# Patient Record
Sex: Male | Born: 1940
Health system: Southern US, Community
[De-identification: ages and names within clinical notes are randomized; demographics above are authoritative.]

## PROBLEM LIST (undated history)

## (undated) DIAGNOSIS — Z8719 Personal history of other diseases of the digestive system: Secondary | ICD-10-CM

## (undated) DIAGNOSIS — R51 Headache: Secondary | ICD-10-CM

## (undated) DIAGNOSIS — Z9889 Other specified postprocedural states: Secondary | ICD-10-CM

## (undated) DIAGNOSIS — R519 Headache, unspecified: Secondary | ICD-10-CM

## (undated) DIAGNOSIS — R112 Nausea with vomiting, unspecified: Secondary | ICD-10-CM

## (undated) DIAGNOSIS — K2270 Barrett's esophagus without dysplasia: Secondary | ICD-10-CM

## (undated) DIAGNOSIS — IMO0001 Reserved for inherently not codable concepts without codable children: Secondary | ICD-10-CM

## (undated) DIAGNOSIS — E782 Mixed hyperlipidemia: Secondary | ICD-10-CM

## (undated) DIAGNOSIS — D509 Iron deficiency anemia, unspecified: Secondary | ICD-10-CM

## (undated) DIAGNOSIS — I447 Left bundle-branch block, unspecified: Secondary | ICD-10-CM

## (undated) DIAGNOSIS — E041 Nontoxic single thyroid nodule: Secondary | ICD-10-CM

## (undated) DIAGNOSIS — R040 Epistaxis: Secondary | ICD-10-CM

## (undated) DIAGNOSIS — M549 Dorsalgia, unspecified: Secondary | ICD-10-CM

## (undated) DIAGNOSIS — E119 Type 2 diabetes mellitus without complications: Secondary | ICD-10-CM

## (undated) DIAGNOSIS — M25552 Pain in left hip: Secondary | ICD-10-CM

## (undated) DIAGNOSIS — C159 Malignant neoplasm of esophagus, unspecified: Secondary | ICD-10-CM

## (undated) DIAGNOSIS — H9190 Unspecified hearing loss, unspecified ear: Secondary | ICD-10-CM

## (undated) DIAGNOSIS — K5790 Diverticulosis of intestine, part unspecified, without perforation or abscess without bleeding: Secondary | ICD-10-CM

## (undated) DIAGNOSIS — D170 Benign lipomatous neoplasm of skin and subcutaneous tissue of head, face and neck: Secondary | ICD-10-CM

## (undated) DIAGNOSIS — M5135 Other intervertebral disc degeneration, thoracolumbar region: Secondary | ICD-10-CM

## (undated) DIAGNOSIS — G609 Hereditary and idiopathic neuropathy, unspecified: Secondary | ICD-10-CM

## (undated) DIAGNOSIS — K219 Gastro-esophageal reflux disease without esophagitis: Secondary | ICD-10-CM

## (undated) DIAGNOSIS — I700 Atherosclerosis of aorta: Secondary | ICD-10-CM

## (undated) HISTORY — PX: SHOULDER ARTHROSCOPY W/ SUPERIOR LABRAL ANTERIOR POSTERIOR LESION REPAIR: SHX2402

## (undated) HISTORY — PX: HERNIA REPAIR: SHX51

## (undated) HISTORY — DX: Left bundle-branch block, unspecified: I44.7

## (undated) HISTORY — PX: COLONOSCOPY: SHX174

## (undated) HISTORY — PX: PORTA CATH INSERTION: CATH118285

## (undated) NOTE — *Deleted (*Deleted)
Juan [name]   [phone] [phone]   Clinic Follow up Note   Patient Care Team: Nord, Rama Georgianne Fick), MD (Inactive) as PCP - General (Internal Medicine) Malachy Mood, MD as Consulting Physician (Hematology) Dorothy Puffer, MD as Consulting Physician (Radiation Oncology) Charna Elizabeth, MD as Consulting Physician (Gastroenterology)  Date of Service:  01/10/2020  CHIEF COMPLAINT: Follow up esophageal squamous cell carcinoma  SUMMARY OF ONCOLOGIC HISTORY: Oncology History Overview Note  Presented with dysphagia and odynophagia with 20-25 pounds of weight loss in about 3-4 months  Esophageal cancer (HCC)   Staging form: Esophagus - Adenocarcinoma, AJCC 7th Edition   - Clinical stage from 11/13/2015: Stage IIIB (T3, N2, M0, G2) - Signed by Malachy Mood, MD on 12/11/2015    Esophageal cancer (HCC)  11/13/2015 Procedure   UPPER ENDOSCOPY per Dr. Loreta Ave: Large fungating, friable bleeding mass in middle third of esophagus, 22cm from incisors and extended to 27cm. Non-obstructing.   11/13/2015 Pathology Results   Invasive squamous cell carcinoma; moderately differentiated   11/13/2015 Imaging   CT ABD/PELVIS: IMPRESSION:No evidence of metastatic disease in the abdomen or pelvis. 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 Aortoiliac atherosclerosis. Small bilateral inguinal hernias containing fat the   11/22/2015 Initial Diagnosis   Esophageal cancer (HCC)   12/03/2015 Imaging   PET scan showed a long segment of hypermetabolic thickening in the mid esophagus consistent with esophageal carcinoma. No clear evidence of node metastasis or distant metastasis.   12/10/2015 - 01/14/2016 Radiation Therapy   Neoadjuvant radiation to his esophageal cancer   12/10/2015 - 01/15/2016 Chemotherapy   Neoadjuvant weekly carboplatin AUC 2, and Taxol 45 mg/m, with concurrent radiation. He developed steroid-induced psychosis, and Taxol was changed to Abraxane to avoid  premedication with steroids.   02/24/2016 Imaging   CT CAP with contrast showed interval improvement mid esophageal mass, no evidence for metastatic adenopathy or distant metastasis.    03/10/2016 Procedure   Repeat EGD by Dr. Elnoria Howard showed wall thickening in the thoracic esophagus with the tumor was. The thickness decreased from 12.8 mm to 6-7 mm. Not able to differentiate between actual tumor versus fibrosis from radiation. Some peritumoral shoddy lymph nodes.   04/07/2016 Surgery   Patient followed up with Dr. Tyrone Sage, patient is not a great candidate for surgery, pt also does not want surgery, mutually agreed to not pursue esophagectomy.    06/01/2016 Imaging   CT C/A/P IMPRESSION: Mild mid esophageal wall thickening without residual mass on CT. Radiation changes in the paramediastinal lung bilaterally. No evidence of recurrent or metastatic disease.   08/18/2016 -  Hospital Admission   Patient presented to the ER with SOB and complaints of chest pain   11/07/2016 Imaging   Nm Pet Image Restag IMPRESSION: Negative PET-CT. No findings for recurrent esophageal cancer or metastatic disease.   01/07/2017 Procedure   EUS by Dr. Elnoria Howard 01/07/17 IMPRESSION - Normal esophagus. - Normal stomach. - Normal examined duodenum. - Wall thickening was seen in the upper third of the esophagus and in the middle third of the esophagus. - One benign lymph node was visualized in the middle paraesophageal mediastinum (level 59M). - No specimens collected.   05/10/2017 Imaging   CT CAP W Contrast 05/10/17 IMPRESSION: 1. No new or progressive findings to suggest recurrent or metastatic disease in the chest or abdomen on today's study. 2.  Aortic Atherosclerois (ICD10-170.0)   01/14/2018 Imaging   01/14/2018 CT Neck IMPRESSION: 1. No evidence of cervical lymphadenopathy. 2. 5 cm  right neck lipoma, mildly enlarged from 2010.   01/14/2018 Imaging   01/14/2018 CT CAP IMPRESSION: 1. Interval  development of several tiny bilateral pulmonary nodules. While indeterminate and potentially related to infectious/inflammatory etiology, close attention recommended as early metastatic disease not excluded. 2. Otherwise stable exam. 3.  Emphysema. (ICD10-J43.9) 4.  Aortic Atherosclerois (ICD10-170.0)   01/25/2019 Imaging   CT AP W contrast  IMPRESSION: No evidence for residual or recurrent tumor within the abdomen or pelvis.     05/07/2019 Procedure   Upper Endoscopy by Dr Elnoria Howard  IMPRESSION - Benign-appearing esophageal stenosis. Dilated. - Normal stomach. - Normal examined duodenum. - No specimens collected.      CURRENT THERAPY:  Observation  INTERVAL HISTORY: *** Juan [name] is here for a follow up of esophageal cancer. He was last seen by me 6 months ago. He presents to the clinic alone.    REVIEW OF SYSTEMS:  *** Constitutional: Denies fevers, chills or abnormal weight loss Eyes: Denies blurriness of vision Ears, nose, mouth, throat, and face: Denies mucositis or sore throat Respiratory: Denies cough, dyspnea or wheezes Cardiovascular: Denies palpitation, chest discomfort or lower extremity swelling Gastrointestinal:  Denies nausea, heartburn or change in bowel habits Skin: Denies abnormal skin rashes Lymphatics: Denies new lymphadenopathy or easy bruising Neurological:Denies numbness, tingling or new weaknesses Behavioral/Psych: Mood is stable, no new changes  All other systems were reviewed with the patient and are negative.  MEDICAL HISTORY:  Past Medical History:  Diagnosis Date  . Aortic atherosclerosis (HCC)   . Back pain   . Barrett esophagus   . Bleeding nose   . DDD (degenerative disc disease), thoracolumbar    Moderate to severe  . Diabetes mellitus without complication (HCC)   . Diverticulosis   . Esophageal cancer (HCC) dx'd 2017  . GERD (gastroesophageal reflux disease)   . Headache   . Hearing loss    Right ear  . History of umbilical  hernia repair   . Idiopathic peripheral neuropathy   . Iron deficiency anemia   . LBBB (left bundle branch block) 02/11/2016  . Left hip pain    Severe degenerative changes  . Lipoma of neck    Right  . Mixed hyperlipidemia   . PONV (postoperative nausea and vomiting)   . Reflux   . Right thyroid nodule     SURGICAL HISTORY: Past Surgical History:  Procedure Laterality Date  . BALLOON DILATION N/A 05/06/2018   Procedure: BALLOON DILATION;  Surgeon: Jeani Hawking, MD;  Location: [facility];  Service: Endoscopy;  Laterality: N/A;  . COLONOSCOPY    . ESOPHAGOGASTRODUODENOSCOPY (EGD) WITH PROPOFOL N/A 05/06/2018   Procedure: ESOPHAGOGASTRODUODENOSCOPY (EGD) WITH PROPOFOL;  Surgeon: Jeani Hawking, MD;  Location: [facility];  Service: Endoscopy;  Laterality: N/A;  . EUS N/A 12/05/2015   Procedure: UPPER ENDOSCOPIC ULTRASOUND (EUS) LINEAR;  Surgeon: Jeani Hawking, MD;  Location: [facility];  Service: Endoscopy;  Laterality: N/A;  . EUS N/A 03/10/2016   Procedure: UPPER ENDOSCOPIC ULTRASOUND (EUS) LINEAR;  Surgeon: Jeani Hawking, MD;  Location: [facility];  Service: Endoscopy;  Laterality: N/A;  . EUS N/A 01/07/2017   Procedure: UPPER ENDOSCOPIC ULTRASOUND (EUS) LINEAR;  Surgeon: Jeani Hawking, MD;  Location: [facility];  Service: Endoscopy;  Laterality: N/A;  . HERNIA REPAIR    . IR GENERIC HISTORICAL  12/24/2015   IR FLUORO GUIDE PORT INSERTION RIGHT 12/24/2015 Oley Balm, MD [facility]  . IR GENERIC HISTORICAL  12/24/2015   IR US GUIDE VASC ACCESS RIGHT 12/24/2015  Oley Balm, MD [facility]  . IR REMOVAL TUN ACCESS W/ PORT W/O FL MOD SED  02/07/2018  . PORTA CATH INSERTION    . SHOULDER ARTHROSCOPY W/ SUPERIOR LABRAL ANTERIOR POSTERIOR LESION REPAIR     times 2  . TOTAL HIP ARTHROPLASTY Left 07/22/2017   Procedure: LEFT TOTAL HIP ARTHROPLASTY ANTERIOR APPROACH;  Surgeon: Samson Frederic, MD;  Location: [facility];  Service: Orthopedics;  Laterality: Left;  . UPPER GI ENDOSCOPY   01/07/2017    I have reviewed the social history and family history with the patient and they are unchanged from previous note.  ALLERGIES:  has No Known Allergies.  MEDICATIONS:  Current Outpatient Medications  Medication Sig Dispense Refill  . albuterol (VENTOLIN HFA) 108 (90 Base) MCG/ACT inhaler Inhale 1-2 puffs into the lungs every 6 (six) hours as needed for wheezing or shortness of breath.     Marland Kitchen amitriptyline (ELAVIL) 10 MG tablet Take 10 mg by mouth at bedtime.    Marland Kitchen aspirin EC 81 MG tablet Take 81 mg by mouth daily.    . B Complex Vitamins (B COMPLEX 1 PO) Take 1 tablet by mouth daily.    . benzonatate (TESSALON) 100 MG capsule Take 1 capsule (100 mg total) by mouth 3 (three) times daily as needed for cough. 30 capsule 0  . celecoxib (CELEBREX) 200 MG capsule Take 200 mg by mouth daily.    . cetirizine (ZYRTEC ALLERGY) 10 MG tablet Take 1 tablet (10 mg total) by mouth daily. 30 tablet 0  . fluticasone (FLONASE) 50 MCG/ACT nasal spray Place 1 spray into both nostrils daily. 16 g 0  . Fluticasone-Salmeterol (ADVAIR DISKUS) 250-50 MCG/DOSE AEPB Inhale 1 puff into the lungs in the morning and at bedtime. 60 each 0  . gabapentin (NEURONTIN) 300 MG capsule Take 300 mg by mouth 2 (two) times daily.    Marland Kitchen glimepiride (AMARYL) 1 MG tablet Take 1 mg by mouth daily after breakfast.     . LIPITOR 20 MG tablet Take 20 mg by mouth every evening.    . Multiple Vitamin (MULTIVITAMIN WITH MINERALS) TABS tablet Take 1 tablet by mouth daily.    . pantoprazole (PROTONIX) 40 MG tablet Take 40 mg by mouth daily.     No current facility-administered medications for this visit.    PHYSICAL EXAMINATION: ECOG PERFORMANCE STATUS: {CHL ONC ECOG PS:[id number]}  There were no vitals filed for this visit. There were no vitals filed for this visit. *** GENERAL:alert, no distress and comfortable SKIN: skin color, texture, turgor are normal, no rashes or significant lesions EYES: normal, Conjunctiva are  pink and non-injected, sclera clear {OROPHARYNX:no exudate, no erythema and lips, buccal mucosa, and tongue normal}  NECK: supple, thyroid normal size, non-tender, without nodularity LYMPH:  no palpable lymphadenopathy in the cervical, axillary {or inguinal} LUNGS: clear to auscultation and percussion with normal breathing effort HEART: regular rate & rhythm and no murmurs and no lower extremity edema ABDOMEN:abdomen soft, non-tender and normal bowel sounds Musculoskeletal:no cyanosis of digits and no clubbing  NEURO: alert & oriented x 3 with fluent speech, no focal motor/sensory deficits  LABORATORY DATA:  I have reviewed the data as listed CBC Latest Ref Rng & Units 01/09/2020 07/22/2019 07/15/2019  WBC 4.0 - 10.5 K/uL 4.2 4.9 3.5(L)  Hemoglobin 13.0 - 17.0 g/dL 11.4(L) 12.7(L) 12.5(L)  Hematocrit 39 - 52 % 37.4(L) 41.3 41.9  Platelets 150 - 400 K/uL 218 278 284     CMP Latest Ref Rng & Units  01/09/2020 07/22/2019 07/15/2019  Glucose 70 - 99 mg/dL 161(W) 960(A) 540(J)  BUN 8 - 23 mg/dL 7(L) 10 6(L)  Creatinine 0.61 - 1.24 mg/dL 8.11 9.14 7.82  Sodium 135 - 145 mmol/L 142 139 138  Potassium 3.5 - 5.1 mmol/L 4.0 4.5 4.1  Chloride 98 - 111 mmol/L 107 105 104  CO2 22 - 32 mmol/L 30 25 25   Calcium 8.9 - 10.3 mg/dL 9.5(A) 9.3 9.0  Total Protein 6.5 - 8.1 g/dL 6.8 - 6.6  Total Bilirubin 0.3 - 1.2 mg/dL 0.5 - 1.2  Alkaline Phos 38 - 126 U/L 69 - 64  AST 15 - 41 U/L 23 - 55(H)  ALT 0 - 44 U/L 16 - 23      RADIOGRAPHIC STUDIES: I have personally reviewed the radiological images as listed and agreed with the findings in the report. No results found.   ASSESSMENT & PLAN:  Juan [name] is a 53 y.o. male with    1. 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 -He was treated with concurrent chemoRTwith weekly carboplatin and Taxol.Taxol was changed to  Abraxane due to steroids induced psychosis -Patient and Dr. Tyrone Sage has mutually agreed not to pursue esophagectomy due to his advanced age and general health condition -We previously discussed that his esophageal cancer is unlikely cured by chemoradiation alone without surgery. I reviewed his repeated EUS after his chemotherapy and irradiation, which showed good response to chemoradiation, but possible residual tumor. -Following surveillance scans have been NED. -He is clinically doing well. Labs reviewed, CBC and CMP WNL except MCV 75.7. Physical exam unremarkable except palpable thyroid nodule. There is no clinical concern for recurrence.  -He is still swallowing adequately with no diet restrictions. No new pain or concerns.  -He is over 3.5 years since his diagnosis. His risk of recurrence is very small. Continue with 5 year surveillance plan. Last surveillance CT scan in 6 months -F/u in 6 months.    2. History of steroids induced psychosis -Resolved now. We'll avoid steroids in the future.  3. Leukopenia and anemia  -Secondary to chemotherapy radiation -We'll continue monitoring, no need for blood transfusion  4. Chronic back pain, left hip painand leg pain -I previously encouraged the patient to follow with PCP and orthopedic surgeon  -He underwent left total hip arthroplasty on 07/22/17. Pain much improved. His overall MSK pain is manageable.  -Managed with Gabapentin and oxycodone. I do not refill his narcotics.   5. H/o COVID-19  -Tested positive in7/2020. Has recovered well. No symptoms remaining.   6. Right neck mass  -He has large right upper neck mass 3.5x4cm on exam today (07/13/19), likely a large thyroid nodule or lipoma.  -Per pt he has had this for 3-4 years. He notes his PCP palpated this before.  -This is likely benign. I recommend neck US to further evaluate for him. He is agreeable.    PLAN:  -neck US next week  -F/u in 6 months with lab and CT CAP  a few days before.    No problem-specific Assessment & Plan notes found for this encounter.   No orders of the defined types were placed in this encounter.  All questions were answered. The patient knows to call the clinic with any problems, questions or concerns. No barriers to learning was detected. The total time spent in the appointment was {CHL ONC TIME VISIT -  [id number]}.     Delphina Cahill 01/10/2020   Rogelia Rohrer, am acting as scribe for Malachy Mood, MD.   {Add scribe attestation statement}

---

## 2006-02-21 ENCOUNTER — Emergency Department (HOSPITAL_COMMUNITY): Admission: EM | Admit: 2006-02-21 | Discharge: 2006-02-22 | Payer: Self-pay | Admitting: Emergency Medicine

## 2007-04-15 ENCOUNTER — Ambulatory Visit (HOSPITAL_COMMUNITY): Admission: RE | Admit: 2007-04-15 | Discharge: 2007-04-15 | Payer: Self-pay | Admitting: *Deleted

## 2007-04-15 ENCOUNTER — Encounter (INDEPENDENT_AMBULATORY_CARE_PROVIDER_SITE_OTHER): Payer: Self-pay | Admitting: *Deleted

## 2007-06-10 ENCOUNTER — Encounter: Admission: RE | Admit: 2007-06-10 | Discharge: 2007-06-10 | Payer: Self-pay | Admitting: Internal Medicine

## 2008-05-30 ENCOUNTER — Encounter (INDEPENDENT_AMBULATORY_CARE_PROVIDER_SITE_OTHER): Payer: Self-pay | Admitting: *Deleted

## 2008-05-30 ENCOUNTER — Ambulatory Visit (HOSPITAL_COMMUNITY): Admission: RE | Admit: 2008-05-30 | Discharge: 2008-05-30 | Payer: Self-pay | Admitting: *Deleted

## 2008-06-08 ENCOUNTER — Emergency Department (HOSPITAL_COMMUNITY): Admission: EM | Admit: 2008-06-08 | Discharge: 2008-06-08 | Payer: Self-pay | Admitting: Emergency Medicine

## 2008-08-22 ENCOUNTER — Encounter: Admission: RE | Admit: 2008-08-22 | Discharge: 2008-08-22 | Payer: Self-pay | Admitting: Internal Medicine

## 2008-10-15 ENCOUNTER — Emergency Department (HOSPITAL_COMMUNITY): Admission: EM | Admit: 2008-10-15 | Discharge: 2008-10-15 | Payer: Self-pay | Admitting: Emergency Medicine

## 2010-04-12 ENCOUNTER — Encounter
Admission: RE | Admit: 2010-04-12 | Discharge: 2010-04-12 | Payer: Self-pay | Source: Home / Self Care | Attending: Internal Medicine | Admitting: Internal Medicine

## 2010-05-15 ENCOUNTER — Encounter (HOSPITAL_BASED_OUTPATIENT_CLINIC_OR_DEPARTMENT_OTHER)
Admission: RE | Admit: 2010-05-15 | Discharge: 2010-05-15 | Disposition: A | Discharge status: Home / Self Care | Payer: Medicare HMO | Source: Ambulatory Visit | Attending: Orthopedic Surgery | Admitting: Orthopedic Surgery

## 2010-05-15 DIAGNOSIS — Z01810 Encounter for preprocedural cardiovascular examination: Secondary | ICD-10-CM | POA: Insufficient documentation

## 2010-05-20 ENCOUNTER — Ambulatory Visit (HOSPITAL_BASED_OUTPATIENT_CLINIC_OR_DEPARTMENT_OTHER)
Admission: RE | Admit: 2010-05-20 | Discharge: 2010-05-21 | Disposition: A | Discharge status: Home / Self Care | Payer: Medicare HMO | Source: Ambulatory Visit | Attending: Orthopedic Surgery | Admitting: Orthopedic Surgery

## 2010-05-20 DIAGNOSIS — Z5333 Arthroscopic surgical procedure converted to open procedure: Secondary | ICD-10-CM | POA: Insufficient documentation

## 2010-05-20 DIAGNOSIS — M67919 Unspecified disorder of synovium and tendon, unspecified shoulder: Secondary | ICD-10-CM | POA: Insufficient documentation

## 2010-05-20 DIAGNOSIS — Z01810 Encounter for preprocedural cardiovascular examination: Secondary | ICD-10-CM | POA: Insufficient documentation

## 2010-05-20 DIAGNOSIS — M719 Bursopathy, unspecified: Secondary | ICD-10-CM | POA: Insufficient documentation

## 2010-05-20 DIAGNOSIS — M7500 Adhesive capsulitis of unspecified shoulder: Secondary | ICD-10-CM | POA: Insufficient documentation

## 2010-05-20 LAB — POCT HEMOGLOBIN-HEMACUE: Hemoglobin: 14.6 g/dL (ref 13.0–17.0)

## 2010-05-29 NOTE — Op Note (Signed)
Juan [name], Juan [name]                  ACCOUNT NO.:  [id number]  MEDICAL RECORD NO.:  [id number]           PATIENT TYPE:  LOCATION:                                 FACILITY:  PHYSICIAN:  Juan Fitch. Day Greb, M.D.      DATE OF BIRTH:  DATE OF PROCEDURE:  05/20/2010 DATE OF DISCHARGE:                              OPERATIVE REPORT   PREOPERATIVE DIAGNOSES:  MRI documented massive rotator cuff tear, right shoulder with acromioclavicular arthropathy and clinical examination suggesting adhesive capsulitis.  POSTOPERATIVE DIAGNOSES:  Adhesive capsulitis, partial-thickness biceps tear, retracted rotator cuff tear involving the entire supraspinatus, infraspinatus, and teres minor posteriorly.  OPERATION: 1. Examination of right shoulder under anesthesia confirming mild     adhesive capsulitis contracture. 2. Diagnostic arthroscopy, right glenohumeral joint followed by     arthroscopic debridement of labral tear, biceps 30% tear, extensive anterior and superior synovitis and capsulectomy to relieve     adhesive capsulitis. 3. Arthroscopic subacromial decompression with bursectomy,     coracoacromial ligament partial release and acromioplasty. 4. Arthroscopic distal clavicle resection. 5. Open reconstruction of three tendons, rotator cuff retracted tear     with margin convergence, and replacement of supraspinatus,     infraspinatus, and teres minor to an anatomic decorticated foot     print.  OPERATING SURGEON:  Juan Fitch. Daja Shuping, MD  ASSISTANT:  Marveen Reeks. Dasnoit, PA-C.  ANESTHESIA:  General by endotracheal technique supplemented by right interscalene block.  SUPERVISING ANESTHESIOLOGIST:  Quita Skye. Krista Blue, MD  INDICATIONS:  Juan [name] is a 70 year old gentleman referred through the courtesy of Dr. Alben Deeds, rheumatologist for evaluation of a painful and weak right shoulder.  Juan [name] had a previous right wrist rotator cuff reconstruction in [place] performed in 1999.  He  presented with pain, weakness, loss of abduction, external rotation of the right shoulder.  Dr. Dierdre Forth very appropriately tried a subacromial injection without relief.  Subsequently, the shoulder was imaged and found to have a significant rotator cuff tear confirmed by an MRI.  Juan [name] is referred for an upper extremity orthopedic consult on April 30, 2010.  At that time, we noted impairment of the right shoulder motion, AC arthropathy, and a very significant retracted rotator cuff tear.  We advised Juan [name] that we proceed with a staged evaluation and management of his shoulder anticipating arthroscopic management of as much of the pathologies possible followed by open reconstruction of his very significant retracted rotator cuff tear.  Questions were invited and answered preoperatively in detail.  His past medical records were reviewed by myself and Dr. Krista Blue, the attending anesthesiologist including records from Dr. Shawnee Knapp office in [facility].  Dr. Krista Blue recommended proceeding with general anesthesia by endotracheal technique supplemented by a right brachial plexus block.  The block was placed in holding area without complication.  Questions were invited and answered in detail.  PROCEDURE:  Juan [name] was brought to room 2 of the [facility] and placed in supine position on the operating table.  Following the induction of general endotracheal anesthesia under Dr. Robina Ade direct supervision, he was  carefully positioned in a beach- chair position with aid of a torso and head holder designed from arthroscopy.  Vancomycin 1 g was administered as an IV prophylactic antibiotic followed by placement of passive compression devices for deep vein thrombosis prevention prior to the induction of anesthesia.  In the beach-chair position, we carefully examined the right shoulder. There is combined elevation of 160, external rotation 80,  internal rotation of 50.  There was moderate adhesive capsulitis noted.  Juan [name]' right shoulder was then prepped with DuraPrep and draped with impervious arthroscopy drapes.  Procedure commenced with placement of the arthroscope through a standard posterior viewing portal with blunt technique.  Diagnostic arthroscopy confirmed a massive retracted rotator cuff tear with necrotic margins. The biceps tendon had a 30% inferior posterior degenerative tear 1 cm medial to the bicipital groove.  The labrum was degenerative and shaggy hanging within the joint.  Juan [name] had 4+ adhesive capsulitis tissues that were addressed by placing a suction shaver through an anterior portal and debrided under direct vision.  The labrum was debrided to a stable margin followed by capsulectomy to facilitate reconstruction of the rotator cuff and release of the adhesive capsulitis contracture.  After completion of the intra-articular debridement.  Photographic documentation of the tear was accomplished followed by removal of the arthroscopic equipment.  The scope was placed in subacromial space followed by extensive bursectomy.  The acromion was leveled to a type 1 morphology.  The coracoacromial ligament relaxed with cutting cautery.  The distal centimeter clavicle was removed arthroscopically.  After hemostasis was achieved, the scope was removed and an anterior middle third deltoid muscle splitting incision was fashioned.  The cuff required an anterior interval slide for mobilization of the supraspinatus.  The infraspinatus and teres minor were identified posteriorly, grasped with a Kingfisher followed by placement of traction sutures of fiber tape.  With great effort, the supraspinatus, infraspinatus, teres minor were dissected with tenotomy scissors relieving bursal adhesions, ultimately mobilized an anatomic footprint.  A complex margin convergence was accomplished with a center swivel lock  with fiber tape at the articular margin, supplemented by the anchor suture of #2 FiberWire.  The tendon was lateralized to an anatomic footprint followed by use of a pair of fiber tapes one advancing the infraspinatus, one advancing the supraspinatus, and anatomic footprint.  Finishing sutures of #2 FiberWire were used in the swivel locks to obtain a smooth repair.  An anatomic footprint was achieved.  The limiting factor and healing of this repair will be the poor quality of the rotator cuff tendons.  We will move very slowly in the sense that Juan [name] will likely require three or more months for revascularizations of the tendon to the decorticated greater tuberosity.  We did perform a greater tuberosity plasty lowering the profile of the tuberosity with a 4 mm due to reactive osteophyte that had formed over the years.  There are no apparent complications.  Total blood loss was less than 50 mL.  The wounds were then irrigated thoroughly followed by repair of the deltoid split with an interrupted suture of 0 Vicryl, repair of the skin with subcutaneous 2-0 Vicryl, and repair the skin itself with intradermal 3-0 Prolene and Steri-Strips.  The portals repaired with intradermal 3-0 Prolene.  There were no apparent complications.  For aftercare, Juan [name] was placed in a sling, awakened from general anesthesia, and transferred to the recovery room with stable signs.  He will be discharged to the care of his family  in 24 hours.  Overnight, we anticipate p.o. and IV Dilaudid for analgesia and IV Ancef 1 g q.8 h. x3 doses.     Juan [name], M.D.     RVS/MEDQ  D:  05/20/2010  T:  05/21/2010  Job:  [id number]  cc:   Donnetta Hail, MD Thurmon Fair, MD  Electronically Signed by Josephine Igo M.D. on 05/29/2010 10:19:28 AM

## 2010-08-12 NOTE — Op Note (Signed)
[name], Juan                  ACCOUNT NO.:  [id number]   MEDICAL RECORD NO.:  [id number]          PATIENT TYPE:  AMB   LOCATION:  ENDO                         FACILITY:  [facility]   PHYSICIAN:  Georgiana Spinner, M.D.    DATE OF BIRTH:  1940/07/02   DATE OF PROCEDURE:  04/15/2007  DATE OF DISCHARGE:                               OPERATIVE REPORT   PROCEDURE:  Upper endoscopy.   INDICATIONS:  Gastroesophageal reflux disease.  .   ANESTHESIA:  Fentanyl 50 mcg, Versed 4 mg.   DESCRIPTION OF PROCEDURE:  With the patient mildly sedated in the left  lateral decubitus position, the Pentax videoscopic endoscope was  inserted in the mouth and passed under direct vision through the  esophagus, which appeared normal, until I reached distal esophagus and  there was an area of Barrett's esophagus, photographed and biopsied.  We  entered Into the stomach.  The  fundus, body, antrum, duodenal bulb, and  second portion of the duodenum were visualized and appeared normal.  From this point, the endoscope was slowly withdrawn taking  circumferential views of the duodenal mucosa until the endoscope had  been  pulled back into the stomach, placed in retroflexion and viewed  the stomach from below.  The endoscope was then straightened, withdrawn,  taking circumferential views of the remaining gastric and esophageal  mucosa.  The patient's vital signs and pulse oximetry remained stable.  The patient tolerated the procedure well without apparent complication.   FINDINGS:  Barrett's esophagus.  Await biopsy report.  The patient will  call me for results and follow-up with me as needed as an outpatient.  Proceed to colonoscopy.           ______________________________  Georgiana Spinner, M.D.     GMO/MEDQ  D:  04/15/2007  T:  04/15/2007  Job:  [id number]

## 2010-08-12 NOTE — Op Note (Signed)
NAMERAJINDER, MESICK                  ACCOUNT NO.:  [id number]   MEDICAL RECORD NO.:  [id number]          PATIENT TYPE:  AMB   LOCATION:  ENDO                         FACILITY:  [facility]   PHYSICIAN:  Georgiana Spinner, M.D.    DATE OF BIRTH:  June 08, 1940   DATE OF PROCEDURE:  DATE OF DISCHARGE:                               OPERATIVE REPORT   PROCEDURE:  Upper endoscopy with biopsy.   INDICATIONS:  Gastroesophageal reflux disease with Barrett's esophagus.   ANESTHESIA:  Fentanyl 50 mcg, Versed 4 mg.   DESCRIPTION OF PROCEDURE:  With the patient mildly sedated in the left  lateral decubitus position, the Pentax videoscopic endoscope was  inserted in the mouth and passed under direct vision through the  esophagus which appeared normal until we reached the distal esophagus  and there were changes of Barrett's, photographed and biopsied.  We  entered into the stomach.  The fundus, body, antrum, duodenal bulb and  second portion of duodenum were visualized.  From this point, the  endoscope was slowly withdrawn taking circumferential views of duodenal  mucosa until the endoscope had been pulled back in the stomach and  placed in retroflexion to view the stomach from below.  The endoscope  was straightened and withdrawn taking circumferential views of remaining  gastric and esophageal mucosa.  The patient's vital signs and pulse  oximeter remained stable.  The patient tolerated the procedure well  without apparent complication.   FINDINGS:  Changes of Barrett's esophagus, biopsied.  Await biopsy  report.  The patient will call me for results and follow-up with me as  an outpatient.           ______________________________  Georgiana Spinner, M.D.     GMO/MEDQ  D:  05/30/2008  T:  05/30/2008  Job:  [id number]

## 2010-08-12 NOTE — Op Note (Signed)
[name], Juan                  ACCOUNT NO.:  [id number]   MEDICAL RECORD NO.:  [id number]          PATIENT TYPE:  AMB   LOCATION:  ENDO                         FACILITY:  [facility]   PHYSICIAN:  Georgiana Spinner, M.D.    DATE OF BIRTH:  06-05-1940   DATE OF PROCEDURE:  04/15/2007  DATE OF DISCHARGE:                               OPERATIVE REPORT   PROCEDURE:  Colonoscopy.   INDICATIONS:  Colon polyps.   ANESTHESIA:  Fentanyl 25 mcg, Versed 1 mg.   DESCRIPTION OF PROCEDURE:  With the patient mildly sedated and in the  left lateral decubitus position, rectal examination was performed which  was unremarkable to my exam.  Subsequently the Pentax videoscopic  colonoscope was inserted in the rectum and passed under direct vision.  With  pressure applied and the patient rolled to his back, we were able  to reach the cecum as identified by ileocecal valve and base of the  cecum, both of which were photographed.  Of note, the prep was  suboptimal in that mostly colon was covered with a gritty, brownish,  semi-liquid material that coated the lining of the colon throughout  except for the rectosigmoid area.  It did wash and was suctioned  somewhat.  The exam was somewhat limited by the prep and a small lesion  certainly could be missed.  From this point,  the colonoscope was slowly  withdrawn, taking circumferential views of the colonic mucosa, stopping  only in the rectum which appeared normal on direct and showed  hemorrhoids on retroflexed view.  The endoscope was straightened and  withdrawn.  The patient's vital signs and pulse oximetry remained  stable.  The patient tolerated the procedure well without apparent  complications.   FINDINGS:  Internal hemorrhoids otherwise an unremarkable examination.   PLAN:  Consider repeat examination in two to five years because of prep.           ______________________________  Georgiana Spinner, M.D.     GMO/MEDQ  D:  04/15/2007  T:  04/15/2007   Job:  [id number]

## 2010-11-25 ENCOUNTER — Emergency Department (HOSPITAL_COMMUNITY): Payer: Medicare HMO

## 2010-11-25 ENCOUNTER — Emergency Department (HOSPITAL_COMMUNITY): Payer: No Typology Code available for payment source

## 2010-11-25 ENCOUNTER — Emergency Department (HOSPITAL_COMMUNITY)
Admission: EM | Admit: 2010-11-25 | Discharge: 2010-11-25 | Disposition: A | Discharge status: Home / Self Care | Payer: No Typology Code available for payment source | Attending: Emergency Medicine | Admitting: Emergency Medicine

## 2010-11-25 ENCOUNTER — Emergency Department (HOSPITAL_COMMUNITY)
Admission: EM | Admit: 2010-11-25 | Discharge: 2010-11-26 | Disposition: A | Discharge status: Home / Self Care | Payer: Medicare HMO | Attending: Emergency Medicine | Admitting: Emergency Medicine

## 2010-11-25 ENCOUNTER — Inpatient Hospital Stay (INDEPENDENT_AMBULATORY_CARE_PROVIDER_SITE_OTHER)
Admission: RE | Admit: 2010-11-25 | Discharge: 2010-11-25 | Disposition: A | Discharge status: Home / Self Care | Payer: Managed Care, Other (non HMO) | Source: Ambulatory Visit | Attending: Family Medicine | Admitting: Family Medicine

## 2010-11-25 DIAGNOSIS — S61209A Unspecified open wound of unspecified finger without damage to nail, initial encounter: Secondary | ICD-10-CM | POA: Insufficient documentation

## 2010-11-25 DIAGNOSIS — R319 Hematuria, unspecified: Secondary | ICD-10-CM | POA: Insufficient documentation

## 2010-11-25 DIAGNOSIS — M129 Arthropathy, unspecified: Secondary | ICD-10-CM | POA: Insufficient documentation

## 2010-11-25 DIAGNOSIS — Z7982 Long term (current) use of aspirin: Secondary | ICD-10-CM | POA: Insufficient documentation

## 2010-11-25 DIAGNOSIS — K219 Gastro-esophageal reflux disease without esophagitis: Secondary | ICD-10-CM | POA: Insufficient documentation

## 2010-11-25 DIAGNOSIS — IMO0002 Reserved for concepts with insufficient information to code with codable children: Secondary | ICD-10-CM | POA: Insufficient documentation

## 2010-11-25 DIAGNOSIS — Y92410 Unspecified street and highway as the place of occurrence of the external cause: Secondary | ICD-10-CM | POA: Insufficient documentation

## 2010-11-25 DIAGNOSIS — R0789 Other chest pain: Secondary | ICD-10-CM | POA: Insufficient documentation

## 2010-11-25 DIAGNOSIS — M79609 Pain in unspecified limb: Secondary | ICD-10-CM | POA: Insufficient documentation

## 2010-11-25 DIAGNOSIS — R071 Chest pain on breathing: Secondary | ICD-10-CM

## 2010-11-25 DIAGNOSIS — R1011 Right upper quadrant pain: Secondary | ICD-10-CM | POA: Insufficient documentation

## 2010-11-25 DIAGNOSIS — R079 Chest pain, unspecified: Secondary | ICD-10-CM | POA: Insufficient documentation

## 2010-11-25 LAB — BASIC METABOLIC PANEL
BUN: 10 mg/dL (ref 6–23)
CO2: 28 mEq/L (ref 19–32)
Calcium: 8.9 mg/dL (ref 8.4–10.5)
Chloride: 103 mEq/L (ref 96–112)
Creatinine, Ser: 0.93 mg/dL (ref 0.50–1.35)
GFR calc Af Amer: >60 mL/min (ref >60)
GFR calc non Af Amer: >60 mL/min (ref >60)
Glucose, Bld: 95 mg/dL (ref 70–99)
Potassium: 3.9 mEq/L (ref 3.5–5.1)
Sodium: 139 mEq/L (ref 135–145)

## 2010-11-25 LAB — POCT URINALYSIS DIP (DEVICE)
Bilirubin Urine: NEGATIVE
Glucose, UA: NEGATIVE mg/dL
Ketones, ur: NEGATIVE mg/dL
Leukocytes, UA: NEGATIVE
Nitrite: NEGATIVE
Protein, ur: NEGATIVE mg/dL
Specific Gravity, Urine: 1.025 (ref 1.005–1.030)
Urobilinogen, UA: 0.2 mg/dL (ref 0.0–1.0)
pH: 6 (ref 5.0–8.0)

## 2010-11-25 LAB — DIFFERENTIAL
Basophils Absolute: 0 10*3/uL (ref 0.0–0.1)
Basophils Relative: 0 % (ref 0–1)
Eosinophils Absolute: 0.1 10*3/uL (ref 0.0–0.7)
Eosinophils Relative: 2 % (ref 0–5)
Lymphocytes Relative: 30 % (ref 12–46)
Lymphs Abs: 1.7 10*3/uL (ref 0.7–4.0)
Monocytes Absolute: 0.5 10*3/uL (ref 0.1–1.0)
Monocytes Relative: 9 % (ref 3–12)
Neutro Abs: 3.5 10*3/uL (ref 1.7–7.7)
Neutrophils Relative %: 60 % (ref 43–77)

## 2010-11-25 LAB — CBC
HCT: 38.8 % — ABNORMAL LOW (ref 39.0–52.0)
Hemoglobin: 12.6 g/dL — ABNORMAL LOW (ref 13.0–17.0)
MCH: 23 pg — ABNORMAL LOW (ref 26.0–34.0)
MCHC: 32.5 g/dL (ref 30.0–36.0)
MCV: 70.8 fL — ABNORMAL LOW (ref 78.0–100.0)
Platelets: 285 10*3/uL (ref 150–400)
RBC: 5.48 MIL/uL (ref 4.22–5.81)
RDW: 15 % (ref 11.5–15.5)
WBC: 5.9 10*3/uL (ref 4.0–10.5)

## 2010-11-25 MED ORDER — IOHEXOL 300 MG/ML  SOLN
100.0000 mL | Freq: Once | INTRAMUSCULAR | Status: AC | PRN
  Administered 2010-11-25: 100 mL via INTRAVENOUS

## 2011-05-22 ENCOUNTER — Encounter (HOSPITAL_COMMUNITY): Payer: Self-pay | Admitting: *Deleted

## 2011-05-22 ENCOUNTER — Emergency Department (INDEPENDENT_AMBULATORY_CARE_PROVIDER_SITE_OTHER)
Admission: EM | Admit: 2011-05-22 | Discharge: 2011-05-22 | Disposition: A | Discharge status: Home Health | Payer: Medicare Other | Source: Home / Self Care | Attending: Emergency Medicine | Admitting: Emergency Medicine

## 2011-05-22 DIAGNOSIS — L299 Pruritus, unspecified: Secondary | ICD-10-CM

## 2011-05-22 HISTORY — DX: Reserved for inherently not codable concepts without codable children: IMO0001

## 2011-05-22 HISTORY — DX: Gastro-esophageal reflux disease without esophagitis: K21.9

## 2011-05-22 MED ORDER — PREDNISONE 20 MG PO TABS: 40.0000 mg | ORAL_TABLET | Freq: Every day | ORAL | Status: AC

## 2011-05-22 MED ORDER — HYDROXYZINE HCL 25 MG PO TABS: 25.0000 mg | ORAL_TABLET | Freq: Four times a day (QID) | ORAL | Status: AC

## 2011-05-22 NOTE — ED Provider Notes (Signed)
History     CSN: [id number]  Arrival date & time 05/22/11  4098   First MD Initiated Contact with Patient 05/22/11 1000      Chief Complaint  Patient presents with  . Rash    (Consider location/radiation/quality/duration/timing/severity/associated sxs/prior treatment) HPI Comments: Patient presents urgent care complaining of generalized itching to both upper arms legs back and torso some of his face and some itchiness and discomfort on his lips and tongue started around Tuesday. Denies any troubles swallowing, or any shortness of breath, or any fascial swelling,  When asked where is a rash patient points only to right side of neck (noted minimal papular eruption) "it feels like itchiness under my skin over"  Patient is a 71 y.o. male presenting with rash. The history is provided by the patient.  Rash  This is a new problem. The current episode started more than 2 days ago. The problem has not changed since onset.There has been no fever. The rash is present on the torso, back, face, head and lips. The patient is experiencing no pain. Associated symptoms include itching. Pertinent negatives include no blisters, no pain and no weeping. He has tried nothing for the symptoms. The treatment provided no relief.    Past Medical History  Diagnosis Date  . Reflux     Past Surgical History  Procedure Date  . Shoulder arthroscopy w/ superior labral anterior posterior lesion repair     No family history on file.  History  Substance Use Topics  . Smoking status: Never Smoker   . Smokeless tobacco: Not on file  . Alcohol Use: No      Review of Systems  Constitutional: Negative for fever, chills, diaphoresis, activity change, appetite change, fatigue and unexpected weight change.  Cardiovascular: Negative for chest pain.  Skin: Positive for itching and rash. Negative for color change and wound.    Allergies  Review of patient's allergies indicates no known allergies.  Home  Medications   Current Outpatient Rx  Name Route Sig Dispense Refill  . HYDROXYZINE HCL 25 MG PO TABS Oral Take 1 tablet (25 mg total) by mouth every 6 (six) hours. 12 tablet 0  . PREDNISONE 20 MG PO TABS Oral Take 2 tablets (40 mg total) by mouth daily. 2 tablets daily for 5 days 10 tablet 0    BP 130/81  Pulse 62  Temp(Src) 98.3 F (36.8 C) (Oral)  Resp 16  SpO2 99%  Physical Exam  Constitutional: He appears well-developed and well-nourished. No distress.  Eyes: Conjunctivae are normal.  Neck: Neck supple. No JVD present.  Lymphadenopathy:    He has no cervical adenopathy.  Skin: Skin is warm. Rash noted. No abrasion noted. There is erythema.    ED Course  Procedures (including critical care time)  Labs Reviewed - No data to display No results found.   1. Pruritic dermatitis       MDM  Generalized pruritus. On exam patient did not exhibit any obvious rash except to his right neck seemed like a papular eruption. No systemic symptoms and did not see any oral mucosa involvement nor any swelling of his lips or tongue.        Juan Molly, MD 05/22/11 1105

## 2011-05-22 NOTE — ED Notes (Signed)
Pt is here with complaints of itching rash on arms, legs, face and neck.  Onset Tuesday.

## 2011-05-22 NOTE — Discharge Instructions (Signed)
Itching Itching is a symptom that can be caused by many things. These include skin problems (including infections) as well as some internal diseases.  If the itching is affecting just one area of the body, it is most likely due to a common skin problem, such as:  Poison oak and poison ivy.   Contact dermatitis (skin irritation from a plant, chemicals, fiberglass, detergents, new cosmetic, new jewelry, or other substance).   Fungus (such as athlete's foot, jock itch, or ringworm).   Head lice   Dandruff   Insect bite   Infection (such as Shingles or other virus infections).  If the itching is all over (widespread), the possible causes are many. These include:   Dry skin or eczema   Heat rash   Hives   Liver disorders   Kidney disorders  TREATMENT  Localized itching   Lubrication of the skin. Use an ointment or cream or other unperfumed moisturizers if the skin is dry. Apply frequently, especially after bathing.   Anti-itch medicines. These medications may help control the urge to scratch. Scratching always makes itching worse and increases the chance of getting an infection.   Cortisone creams and ointments. These help reduce the inflammation.   Antibiotics. Skin infections can cause itching. Topical or oral antibiotics may be needed for 10 to 20 days to get rid of an infection.  If you can identify what caused the itching, avoid this substance in the future.  Widespread itching  The following measures may help to relieve itching regardless of the cause:   Wash the skin once with soap to remove irritants.   Bathe in tepid water with baking soda, cornstarch, or oatmeal.   Use calamine lotion (nonprescription) or a baking soda solution (1 teaspoon in 4 ounces of water on the skin).   Apply 1% hydrocortisone cream (no prescription needed). Do not use this if there might be a skin infection.   Avoid scratching.   Avoid itchy or tight-fitting clothes.   Avoid excessive  heat, sweating, scented soaps, and swimming pools.   The lubricants, anti-itch medicines, etc. noted above may be helpful for controlling symptoms.  SEEK MEDICAL CARE IF:   The itching becomes severe.   Your itch is not better after 1 week of treatment. Contact your caregiver to schedule further evaluation.  Document Released: 03/16/2005 Document Revised: 11/26/2010 Document Reviewed: 09/03/2006 [employer] Patient Information 2012 [employer]. 

## 2011-05-25 ENCOUNTER — Telehealth (HOSPITAL_COMMUNITY): Payer: Self-pay | Admitting: *Deleted

## 2011-05-25 NOTE — ED Notes (Signed)
Pt. Called and said he needed a work note. 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 Discussed with Dr. Juanetta Gosling and approved 3 day note. Note done as directed and put at the front desk. I called pt. and left message note was done. Vassie Moselle 05/25/2011

## 2011-05-27 ENCOUNTER — Telehealth (HOSPITAL_COMMUNITY): Payer: Self-pay | Admitting: *Deleted

## 2011-05-27 NOTE — ED Notes (Signed)
Pt. 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 Pt. asked for the note to be faxed on one of the messages. 2/26 1406 I called and left message, that note can't be faxed.  He will have to pick it up. I asked for pt. to call.  Discussed with Dr. Juanetta Gosling and he said he can be off for 3 days. Note done as directed and put at the front desk.  2/27 I called pt. back and he said he picked up the note. Vassie Moselle 05/27/2011

## 2012-03-23 ENCOUNTER — Emergency Department (HOSPITAL_COMMUNITY)
Admission: EM | Admit: 2012-03-23 | Discharge: 2012-03-23 | Disposition: A | Discharge status: Home / Self Care | Payer: Medicare Other | Attending: Emergency Medicine | Admitting: Emergency Medicine

## 2012-03-23 ENCOUNTER — Emergency Department (HOSPITAL_COMMUNITY): Payer: Medicare Other

## 2012-03-23 ENCOUNTER — Encounter (HOSPITAL_COMMUNITY): Payer: Self-pay | Admitting: *Deleted

## 2012-03-23 DIAGNOSIS — R1110 Vomiting, unspecified: Secondary | ICD-10-CM | POA: Insufficient documentation

## 2012-03-23 DIAGNOSIS — J029 Acute pharyngitis, unspecified: Secondary | ICD-10-CM | POA: Insufficient documentation

## 2012-03-23 DIAGNOSIS — R0781 Pleurodynia: Secondary | ICD-10-CM

## 2012-03-23 DIAGNOSIS — R071 Chest pain on breathing: Secondary | ICD-10-CM | POA: Insufficient documentation

## 2012-03-23 DIAGNOSIS — R6883 Chills (without fever): Secondary | ICD-10-CM | POA: Insufficient documentation

## 2012-03-23 DIAGNOSIS — Z8719 Personal history of other diseases of the digestive system: Secondary | ICD-10-CM | POA: Insufficient documentation

## 2012-03-23 DIAGNOSIS — J40 Bronchitis, not specified as acute or chronic: Secondary | ICD-10-CM | POA: Insufficient documentation

## 2012-03-23 DIAGNOSIS — Z7982 Long term (current) use of aspirin: Secondary | ICD-10-CM | POA: Insufficient documentation

## 2012-03-23 DIAGNOSIS — J479 Bronchiectasis, uncomplicated: Secondary | ICD-10-CM | POA: Insufficient documentation

## 2012-03-23 DIAGNOSIS — R109 Unspecified abdominal pain: Secondary | ICD-10-CM | POA: Insufficient documentation

## 2012-03-23 MED ORDER — PREDNISONE 20 MG PO TABS: ORAL_TABLET | ORAL | Status: DC

## 2012-03-23 MED ORDER — TRAMADOL-ACETAMINOPHEN 37.5-325 MG PO TABS: ORAL_TABLET | ORAL | Status: DC

## 2012-03-23 MED ORDER — IBUPROFEN 800 MG PO TABS
800.0000 mg | ORAL_TABLET | Freq: Once | ORAL | Status: AC
  Administered 2012-03-23: 800 mg via ORAL
  Filled 2012-03-23: qty 1

## 2012-03-23 MED ORDER — LEVOFLOXACIN 750 MG PO TABS: 750.0000 mg | ORAL_TABLET | Freq: Every day | ORAL | Status: DC

## 2012-03-23 MED ORDER — BENZONATATE 100 MG PO CAPS: 100.0000 mg | ORAL_CAPSULE | Freq: Three times a day (TID) | ORAL | Status: DC | PRN

## 2012-03-23 MED ORDER — AEROCHAMBER Z-STAT PLUS/MEDIUM MISC
1.0000 | Freq: Once | Status: AC
  Administered 2012-03-23: 1

## 2012-03-23 MED ORDER — ALBUTEROL SULFATE HFA 108 (90 BASE) MCG/ACT IN AERS: 2.0000 | INHALATION_SPRAY | RESPIRATORY_TRACT | Status: DC | PRN

## 2012-03-23 MED ORDER — TRAMADOL HCL 50 MG PO TABS
100.0000 mg | ORAL_TABLET | Freq: Once | ORAL | Status: AC
  Administered 2012-03-23: 100 mg via ORAL
  Filled 2012-03-23: qty 2

## 2012-03-23 NOTE — ED Provider Notes (Signed)
History     CSN: [id number]  Arrival date & time 03/23/12  0406   First MD Initiated Contact with Patient 03/23/12 [phone]      Chief Complaint  Patient presents with  . Cough  . Flank Pain  . Emesis    (Consider location/radiation/quality/duration/timing/severity/associated sxs/prior treatment) HPI  Patient reports he has had a cough for the past 2 weeks that was initially dry then started producing yellow sputum and now it's white again. He denies fever that has been documented. He states he does have sweats and he has had chills in the ED. He states she's starting to get a sore throat when he coughs and he states the center of his chest hurts when he coughs. He also has pain in his right lateral chest wall when he coughs but he states the pain is deep inside and cannot be touched from the outside. He denies shortness of breath and states sometimes he feels like his wheezing. He states he's had this before however he's never had to use an inhaler. He states any type movement and coughing makes the pain worse. Nothing makes it feel better.  PCP Dr. Nicholos Johns  Past Medical History  Diagnosis Date  . Reflux     Past Surgical History  Procedure Date  . Shoulder arthroscopy w/ superior labral anterior posterior lesion repair     History reviewed. No pertinent family history.  History  Substance Use Topics  . Smoking status: Never Smoker   . Smokeless tobacco: Not on file  . Alcohol Use: No  Lives at home Lives with spouse no second hand smoke  Review of Systems  All other systems reviewed and are negative.    Allergies  Review of patient's allergies indicates no known allergies.  Home Medications   Current Outpatient Rx  Name  Route  Sig  Dispense  Refill  . ASPIRIN EC 81 MG PO TBEC   Oral   Take 81 mg by mouth daily.         . GUAIFENESIN 100 MG/5ML PO SOLN   Oral   Take 5 mLs by mouth every 4 (four) hours as needed. For cough           BP 128/74   Pulse 73  Temp 98.9 F (37.2 C) (Oral)  Resp 20  Ht 5\' 9"  (1.753 m)  Wt 189 lb (85.73 kg)  BMI 27.91 kg/m2  SpO2 98%  Vital signs normal    Physical Exam  Nursing note and vitals reviewed. Constitutional: He is oriented to person, place, and time. He appears well-developed and well-nourished.  Non-toxic appearance. He does not appear ill. No distress.  HENT:  Head: Normocephalic and atraumatic.  Right Ear: External ear normal.  Left Ear: External ear normal.  Nose: Nose normal. No mucosal edema or rhinorrhea.  Mouth/Throat: Oropharynx is clear and moist and mucous membranes are normal. No dental abscesses or uvula swelling.  Eyes: Conjunctivae normal and EOM are normal. Pupils are equal, round, and reactive to light.  Neck: Normal range of motion and full passive range of motion without pain. Neck supple.  Cardiovascular: Normal rate, regular rhythm and normal heart sounds.  Exam reveals no gallop and no friction rub.   No murmur heard. Pulmonary/Chest: Effort normal and breath sounds normal. No respiratory distress. He has no wheezes. He has no rhonchi. He has no rales. He exhibits no tenderness and no crepitus.         Area of pain noted, coughing  frequently  Abdominal: Soft. Normal appearance and bowel sounds are normal. He exhibits no distension. There is no tenderness. There is no rebound and no guarding.  Musculoskeletal: Normal range of motion. He exhibits no edema and no tenderness.       Moves all extremities well.   Neurological: He is alert and oriented to person, place, and time. He has normal strength. No cranial nerve deficit.  Skin: Skin is warm, dry and intact. No rash noted. No erythema. No pallor.  Psychiatric: He has a normal mood and affect. His speech is normal and behavior is normal. His mood appears not anxious.    ED Course  Procedures (including critical care time)    Medications  traMADol (ULTRAM) tablet 100 mg (not administered)  ibuprofen  (ADVIL,MOTRIN) tablet 800 mg (not administered)      Dg Chest 2 View  03/23/2012  *RADIOLOGY REPORT*  Clinical Data: Cough and congestion; right lateral chest wall pain.  CHEST - 2 VIEW  Comparison: Chest radiograph performed 11/25/2010  Findings: The lungs are well-aerated.  Mild chronic bronchiectasis is noted at the right lung base.  There is no evidence of focal opacification, pleural effusion or pneumothorax.  The heart is normal in size; the mediastinal contour is within normal limits.  No acute osseous abnormalities are seen.  IMPRESSION: Mild chronic bronchiectasis noted at the right lung base; no acute focal consolidation seen.   Original Report Authenticated By: Tonia Ghent, M.D.      1. Pleuritic chest pain   2. Bronchiectasis   3. Bronchitis    New Prescriptions   ALBUTEROL (PROVENTIL HFA;VENTOLIN HFA) 108 (90 BASE) MCG/ACT INHALER    Inhale 2 puffs into the lungs every 4 (four) hours as needed for wheezing.   BENZONATATE (TESSALON) 100 MG CAPSULE    Take 1 capsule (100 mg total) by mouth 3 (three) times daily as needed for cough (DO NOT CHEW!).   LEVOFLOXACIN (LEVAQUIN) 750 MG TABLET    Take 1 tablet (750 mg total) by mouth daily.   PREDNISONE (DELTASONE) 20 MG TABLET    Take 3 po QD x 3d , then 2 po QD x 3d then 1 po QD x 3d   TRAMADOL-ACETAMINOPHEN (ULTRACET) 37.5-325 MG PER TABLET    2 tabs po QID prn pain    Plan discharge  Devoria Albe, MD, Armando Gang    MDM          Ward Givens, MD 03/23/12 [phone]

## 2012-03-23 NOTE — ED Notes (Signed)
Patient is alert and oriented x3.  He was given DC instructions and follow up visit instructions.  Patient gave verbal understanding.  He was DC ambulatory under his own power to home.  V/S stable.  He was not showing any signs of distress on DC 

## 2012-03-23 NOTE — ED Notes (Signed)
Pt states he has been coughing for two weeks and he has pain in his right side area and vomiting from coughing so much.  Denies sob

## 2012-03-23 NOTE — Discharge Instructions (Signed)
Drink plenty of fluids. Take the medications as prescribed. Recheck if you feel worse, such as high fever, struggling to breathe, worsening pain.

## 2012-04-26 ENCOUNTER — Other Ambulatory Visit: Payer: Self-pay | Admitting: Internal Medicine

## 2012-04-26 DIAGNOSIS — R1310 Dysphagia, unspecified: Secondary | ICD-10-CM

## 2012-05-05 ENCOUNTER — Other Ambulatory Visit: Payer: Self-pay | Admitting: Internal Medicine

## 2012-05-05 ENCOUNTER — Ambulatory Visit
Admission: RE | Admit: 2012-05-05 | Discharge: 2012-05-05 | Disposition: A | Discharge status: Home / Self Care | Payer: Medicare Other | Source: Ambulatory Visit | Attending: Internal Medicine | Admitting: Internal Medicine

## 2012-05-05 DIAGNOSIS — R1310 Dysphagia, unspecified: Secondary | ICD-10-CM

## 2012-06-30 ENCOUNTER — Other Ambulatory Visit: Payer: Self-pay | Admitting: Internal Medicine

## 2012-06-30 DIAGNOSIS — N5082 Scrotal pain: Secondary | ICD-10-CM

## 2012-07-06 ENCOUNTER — Ambulatory Visit
Admission: RE | Admit: 2012-07-06 | Discharge: 2012-07-06 | Disposition: A | Discharge status: Home / Self Care | Payer: Medicare Other | Source: Ambulatory Visit | Attending: Internal Medicine | Admitting: Internal Medicine

## 2012-07-06 DIAGNOSIS — N5082 Scrotal pain: Secondary | ICD-10-CM

## 2012-07-17 ENCOUNTER — Emergency Department (INDEPENDENT_AMBULATORY_CARE_PROVIDER_SITE_OTHER)
Admission: EM | Admit: 2012-07-17 | Discharge: 2012-07-17 | Disposition: A | Discharge status: Home / Self Care | Payer: Medicare Other | Source: Home / Self Care | Attending: Family Medicine | Admitting: Family Medicine

## 2012-07-17 ENCOUNTER — Encounter (HOSPITAL_COMMUNITY): Payer: Self-pay | Admitting: *Deleted

## 2012-07-17 DIAGNOSIS — H60399 Other infective otitis externa, unspecified ear: Secondary | ICD-10-CM

## 2012-07-17 DIAGNOSIS — J309 Allergic rhinitis, unspecified: Secondary | ICD-10-CM

## 2012-07-17 DIAGNOSIS — H6091 Unspecified otitis externa, right ear: Secondary | ICD-10-CM

## 2012-07-17 MED ORDER — LORATADINE 10 MG PO TABS: 10.0000 mg | ORAL_TABLET | Freq: Every day | ORAL | Status: DC

## 2012-07-17 MED ORDER — TRAMADOL HCL 50 MG PO TABS: 50.0000 mg | ORAL_TABLET | Freq: Four times a day (QID) | ORAL | Status: DC | PRN

## 2012-07-17 MED ORDER — FLUTICASONE PROPIONATE 50 MCG/ACT NA SUSP: 2.0000 | Freq: Every day | NASAL | Status: DC

## 2012-07-17 MED ORDER — CIPROFLOXACIN-DEXAMETHASONE 0.3-0.1 % OT SUSP: 4.0000 [drp] | Freq: Two times a day (BID) | OTIC | Status: DC

## 2012-07-17 NOTE — Discharge Instructions (Signed)
Is very important top keep well hydrated. Take the prescribed medications as instructed. Use nasal saline spray at least 3 times a day. (simply saline is over the counter) can alternate with prescribed nasal steroid. Return if worsening symptoms like difficulty breathing, new onset of fever, worsening pain or not keeping fluids down.

## 2012-07-17 NOTE — ED Provider Notes (Signed)
History     CSN: [id number]  Arrival date & time 07/17/12  1111   First MD Initiated Contact with Patient 07/17/12 1122      Chief Complaint  Patient presents with  . Otalgia    (Consider location/radiation/quality/duration/timing/severity/associated sxs/prior treatment) HPI Comments: 72 year old male with history of acid reflux and seasonal allergies. Here complaining of nasal congestion, sneezing and rhinorrhea for several days. Symptoms worsening and in the last 5 days associated with right ear pain. Reports he had water into his right ear while taking a shower several days ago. He is currently finishing 10 days of an antibiotic that was prescribed for a skin infection and he is unaware often name of the antibiotic. Denies fever or chills. No sore throat. No cough, chest pain or shortness of breath.   Past Medical History  Diagnosis Date  . Reflux     Past Surgical History  Procedure Laterality Date  . Shoulder arthroscopy w/ superior labral anterior posterior lesion repair      No family history on file.  History  Substance Use Topics  . Smoking status: Never Smoker   . Smokeless tobacco: Not on file  . Alcohol Use: No      Review of Systems  Constitutional: Negative for fever and chills.  HENT: Positive for ear pain, congestion and sneezing. Negative for hearing loss, sore throat, neck pain and tinnitus.   Respiratory: Negative for cough, shortness of breath and wheezing.   Cardiovascular: Negative for chest pain.  Gastrointestinal: Negative for nausea, vomiting, abdominal pain and diarrhea.    Allergies  Review of patient's allergies indicates no known allergies.  Home Medications   Current Outpatient Rx  Name  Route  Sig  Dispense  Refill  . albuterol (PROVENTIL HFA;VENTOLIN HFA) 108 (90 BASE) MCG/ACT inhaler   Inhalation   Inhale 2 puffs into the lungs every 4 (four) hours as needed for wheezing.   1 Inhaler   0   . aspirin EC 81 MG tablet   Oral   Take 81 mg by mouth daily.         . benzonatate (TESSALON) 100 MG capsule   Oral   Take 1 capsule (100 mg total) by mouth 3 (three) times daily as needed for cough (DO NOT CHEW!).   21 capsule   0   . ciprofloxacin-dexamethasone (CIPRODEX) otic suspension   Right Ear   Place 4 drops into the right ear 2 (two) times daily.   7.5 mL   0     Use for 5-7 days   . fluticasone (FLONASE) 50 MCG/ACT nasal spray   Nasal   Place 2 sprays into the nose daily.   16 g   0   . loratadine (CLARITIN) 10 MG tablet   Oral   Take 1 tablet (10 mg total) by mouth daily.   30 tablet   0   . traMADol (ULTRAM) 50 MG tablet   Oral   Take 1 tablet (50 mg total) by mouth every 6 (six) hours as needed for pain.   15 tablet   0     BP 135/80  Pulse 68  Temp(Src) 97.9 F (36.6 C) (Oral)  Resp 16  SpO2 99%  Physical Exam  Nursing note and vitals reviewed. Constitutional: He is oriented to person, place, and time. He appears well-developed and well-nourished. No distress.  HENT:  Head: Normocephalic and atraumatic.  Nasal Congestion with erythema and swelling of nasal turbinates, clear rhinorrhea. mild pharyngeal erythema  no exudates. No uvula deviation. No trismus. Left ear canal normal, left TM normal. Right ear canal with erythema and mild swelling. No significant exudate. Right TM's appears dull. No redness, swelling or bulging.  Eyes: Conjunctivae and EOM are normal. Pupils are equal, round, and reactive to light. Right eye exhibits no discharge. Left eye exhibits no discharge.  Neck: Neck supple.  Cardiovascular: Normal heart sounds.   Pulmonary/Chest: Effort normal and breath sounds normal. He has no wheezes. He has no rales.  Lymphadenopathy:    He has no cervical adenopathy.  Neurological: He is alert and oriented to person, place, and time.  Skin: No rash noted. He is not diaphoretic.    ED Course  Procedures (including critical care time)  Labs Reviewed - No data to  display No results found.   1. Allergic rhinitis   2. External otitis of right ear       MDM  Prescribed loratadine, fluticasone nasal, Ciprodex otic and tramadol. Supportive care and red flags that should prompt his return to medical attention discussed with patient and provided in writing.        Sharin Grave, MD 07/19/12 2956

## 2012-07-17 NOTE — ED Notes (Signed)
Patient complains of pain and pressure in right ear x 5 days with worsening pain. Also, head congestion. Denies fever/chills.

## 2013-08-05 ENCOUNTER — Emergency Department (HOSPITAL_COMMUNITY)
Admission: EM | Admit: 2013-08-05 | Discharge: 2013-08-06 | Disposition: A | Discharge status: Home / Self Care | Payer: Medicare Other | Attending: Emergency Medicine | Admitting: Emergency Medicine

## 2013-08-05 ENCOUNTER — Encounter (HOSPITAL_COMMUNITY): Payer: Self-pay | Admitting: Emergency Medicine

## 2013-08-05 DIAGNOSIS — Z79899 Other long term (current) drug therapy: Secondary | ICD-10-CM | POA: Insufficient documentation

## 2013-08-05 DIAGNOSIS — Z7982 Long term (current) use of aspirin: Secondary | ICD-10-CM | POA: Insufficient documentation

## 2013-08-05 DIAGNOSIS — K219 Gastro-esophageal reflux disease without esophagitis: Secondary | ICD-10-CM | POA: Diagnosis not present

## 2013-08-05 DIAGNOSIS — Z792 Long term (current) use of antibiotics: Secondary | ICD-10-CM | POA: Diagnosis not present

## 2013-08-05 DIAGNOSIS — IMO0002 Reserved for concepts with insufficient information to code with codable children: Secondary | ICD-10-CM | POA: Insufficient documentation

## 2013-08-05 DIAGNOSIS — R1310 Dysphagia, unspecified: Secondary | ICD-10-CM | POA: Diagnosis present

## 2013-08-05 NOTE — ED Provider Notes (Signed)
CSN: [id number]     Arrival date & time 08/05/13  2215 History  This chart was scribed for non-physician practitioner, Domenic Moras, PA-C working with Delora Fuel, MD by Frederich Balding, ED scribe. This patient was seen in room TR04C/TR04C and the patient's care was started at 11:42 PM.   Chief Complaint  Patient presents with  . Painful swallowing    The history is provided by the patient. No language interpreter was used.   HPI Comments: Juan [name] is a 73 y.o. male with history of acid reflux who presents to the Emergency Department complaining of intermittent painful swallowing that started about 3 months ago. Denies current discomfort. He states he only has it when he drinks coffee and eats hard foods such as fried chicken. Pt takes nexium daily. Denies weight change, fever, chills, choking problem, SOB, chest pain, nausea, emesis, black tarry stools. Pt has an appointment with his PCP in June. Denies history of cancer.   Past Medical History  Diagnosis Date  . Reflux    Past Surgical History  Procedure Laterality Date  . Shoulder arthroscopy w/ superior labral anterior posterior lesion repair     History reviewed. No pertinent family history. History  Substance Use Topics  . Smoking status: Never Smoker   . Smokeless tobacco: Not on file  . Alcohol Use: No    Review of Systems  Constitutional: Negative for fever, chills and unexpected weight change.  Respiratory: Negative for shortness of breath.   Cardiovascular: Negative for chest pain.  Gastrointestinal: Negative for nausea and vomiting.  All other systems reviewed and are negative.  Allergies  Review of patient's allergies indicates no known allergies.  Home Medications   Prior to Admission medications   Medication Sig Start Date End Date Taking? Authorizing Provider  albuterol (PROVENTIL HFA;VENTOLIN HFA) 108 (90 BASE) MCG/ACT inhaler Inhale 2 puffs into the lungs every 4 (four) hours as needed for wheezing. 03/23/12    Janice Norrie, MD  aspirin EC 81 MG tablet Take 81 mg by mouth daily.    Historical Provider, MD  benzonatate (TESSALON) 100 MG capsule Take 1 capsule (100 mg total) by mouth 3 (three) times daily as needed for cough (DO NOT CHEW!). 03/23/12   Janice Norrie, MD  ciprofloxacin-dexamethasone (CIPRODEX) otic suspension Place 4 drops into the right ear 2 (two) times daily. 07/17/12   Adlih Moreno-Coll, MD  fluticasone (FLONASE) 50 MCG/ACT nasal spray Place 2 sprays into the nose daily. 07/17/12   Adlih Moreno-Coll, MD  loratadine (CLARITIN) 10 MG tablet Take 1 tablet (10 mg total) by mouth daily. 07/17/12   Adlih Moreno-Coll, MD  traMADol (ULTRAM) 50 MG tablet Take 1 tablet (50 mg total) by mouth every 6 (six) hours as needed for pain. 07/17/12   Adlih Moreno-Coll, MD   BP 120/80  Pulse 55  Temp(Src) 98.2 F (36.8 C) (Oral)  Resp 18  Ht 5\' 9"  (1.753 m)  Wt 196 lb (88.905 kg)  BMI 28.93 kg/m2  SpO2 99%  Physical Exam  Nursing note and vitals reviewed. Constitutional: He is oriented to person, place, and time. He appears well-developed and well-nourished. No distress.  HENT:  Head: Normocephalic and atraumatic.  Upper denture. Uvula midline. No tonsillar enlargement or exudate. No evidence of deep tissue infection.   Eyes: EOM are normal.  Neck: Neck supple. No tracheal deviation present.  No thyromegaly.   Cardiovascular: Normal rate, regular rhythm and normal heart sounds.   Pulmonary/Chest: Effort normal and breath sounds normal.  No respiratory distress. He has no wheezes. He has no rales.  Musculoskeletal: Normal range of motion.  Neurological: He is alert and oriented to person, place, and time.  Skin: Skin is warm and dry.  Psychiatric: He has a normal mood and affect. His behavior is normal.    ED Course  Procedures (including critical care time)  DIAGNOSTIC STUDIES: Oxygen Saturation is 99% on RA, normal by my interpretation.    COORDINATION OF CARE: 11:47 PM-Discussed treatment  plan which includes zantac and GI referral with pt at bedside and pt agreed to plan. Advised pt to follow up. Possibility of Barrett's esophagus given hx of GERD and his age.  However, no acute pathology at this time, will benefit from outpt care.  Return precaution discussed.  Pt currently able to swallow without any difficulty.  Pt has zantac and nexium at home which i encourage to continue.    Labs Review Labs Reviewed - No data to display  Imaging Review No results found.   EKG Interpretation None      MDM   Final diagnoses:  GERD (gastroesophageal reflux disease)    BP 120/80  Pulse 55  Temp(Src) 98.2 F (36.8 C) (Oral)  Resp 18  Ht 5\' 9"  (1.753 m)  Wt 196 lb (88.905 kg)  BMI 28.93 kg/m2  SpO2 99%   I personally performed the services described in this documentation, which was scribed in my presence. The recorded information has been reviewed and is accurate.  Domenic Moras, PA-C 08/06/13 0006

## 2013-08-05 NOTE — ED Notes (Signed)
Patient presents stating that when he eats hard food (fried chicken, etc and hot coffee)  He feels it going all the way down the esophagus and it hurts.  Denies any choking with foods or fluids

## 2013-08-05 NOTE — ED Notes (Signed)
Onset 3 or 4 months pt is having mid chest pain every times he swallows saliva, food, drinks.  Pt takes Nexium for acid reflux daily.

## 2013-08-06 NOTE — ED Provider Notes (Signed)
Medical screening examination/treatment/procedure(s) were performed by non-physician practitioner and as supervising physician I was immediately available for consultation/collaboration.   Delora Fuel, MD 54/36/06 [id number]

## 2013-08-06 NOTE — Discharge Instructions (Signed)
Diet for Gastroesophageal Reflux Disease, Adult °Reflux (acid reflux) is when acid from your stomach flows up into the esophagus. When acid comes in contact with the esophagus, the acid causes irritation and soreness (inflammation) in the esophagus. When reflux happens often or so severely that it causes damage to the esophagus, it is called gastroesophageal reflux disease (GERD). Nutrition therapy can help ease the discomfort of GERD. °FOODS OR DRINKS TO AVOID OR LIMIT °· Smoking or chewing tobacco. Nicotine is one of the most potent stimulants to acid production in the gastrointestinal tract. °· Caffeinated and decaffeinated coffee and black tea. °· Regular or low-calorie carbonated beverages or energy drinks (caffeine-free carbonated beverages are allowed).   °· Strong spices, such as black pepper, white pepper, red pepper, cayenne, curry powder, and chili powder. °· Peppermint or spearmint. °· Chocolate. °· High-fat foods, including meats and fried foods. Extra added fats including oils, butter, salad dressings, and nuts. Limit these to less than 8 tsp per day. °· Fruits and vegetables if they are not tolerated, such as citrus fruits or tomatoes. °· Alcohol. °· Any food that seems to aggravate your condition. °If you have questions regarding your diet, call your caregiver or a registered dietitian. °OTHER THINGS THAT MAY HELP GERD INCLUDE:  °· Eating your meals slowly, in a relaxed setting. °· Eating 5 to 6 small meals per day instead of 3 large meals. °· Eliminating food for a period of time if it causes distress. °· Not lying down until 3 hours after eating a meal. °· Keeping the head of your bed raised 6 to 9 inches (15 to 23 cm) by using a foam wedge or blocks under the legs of the bed. Lying flat may make symptoms worse. °· Being physically active. Weight loss may be helpful in reducing reflux in overweight or obese adults. °· Wear loose fitting clothing °EXAMPLE MEAL PLAN °This meal plan is approximately  2,000 calories based on [employer] meal planning guidelines. °Breakfast °· ½ cup cooked oatmeal. °· 1 cup strawberries. °· 1 cup low-fat milk. °· 1 oz almonds. °Snack °· 1 cup cucumber slices. °· 6 oz yogurt (made from low-fat or fat-free milk). °Lunch °· 2 slice whole-wheat bread. °· 2½ oz sliced turkey. °· 2 tsp mayonnaise. °· 1 cup blueberries. °· 1 cup snap peas. °Snack °· 6 whole-wheat crackers. °· 1 oz string cheese. °Dinner °· ½ cup brown rice. °· 1 cup mixed veggies. °· 1 tsp olive oil. °· 3 oz grilled fish. °Document Released: 03/16/2005 Document Revised: 06/08/2011 Document Reviewed: 01/30/2011 °[employer]® Patient Information ©2014 [employer]. ° °Barrett's Esophagus °Barrett's esophagus occurs when the lining of the esophagus is damaged. The esophagus is the tube that carries food from the mouth to the stomach. With Barrett's esophagus, the lining of the esophagus gets replaced by material that is similar to the lining in the intestines. This process is called intestinal metaplasia. A small number of people with Barrett's esophagus develop esophageal cancer. °CAUSES  °The exact cause of Barrett's esophagus is unknown. °SYMPTOMS  °Most people with Barrett's esophagus do not have symptoms. However, many patients also have gastroesophageal reflux disease (GERD). GERD can cause heartburn, trouble swallowing, and a dry cough. °DIAGNOSIS °Barrett's esophagus is diagnosed by an exam called upper gastrointestinal endoscopy. A thin, flexible tube (endoscope) is passed down the esophagus. The endoscope has a light and camera on the end. Your caregiver uses the endoscope to view the inside of the esophagus. A tissue sample may also be taken and examined   under a microscope (biopsy). If cancer cells are found during the biopsy, this condition is called dysplasia. °TREATMENT  °If you have no dysplasia or low-grade dysplasia, your caregiver may recommend no treatment or only taking medicines to treat GERD.  Sometimes, taking acid-blocking drugs to treat GERD helps improve the tissue affected by Barrett's esophagus. Your caregiver may also recommend periodic esophageal exams. °If you have high-grade dysplasia, treatment may include removing the damaged parts of the esophagus. This can be done by heating, freezing, or surgically removing the tissue. In some cases, surgery may be done to remove most of the esophagus. The stomach is then attached to the remaining portion of the esophagus. °HOME CARE INSTRUCTIONS °· Take acid-blocking drugs for GERD if recommended by your caregiver. °· Keep all follow-up appointments as directed by your caregiver. You may need periodic esophageal exams. °SEEK IMMEDIATE MEDICAL CARE IF: °· You have chest pain. °· You have trouble swallowing. °· You vomit blood or material that looks like coffee grounds. °· Your stools are bright red or dark. °Document Released: 06/06/2003 Document Revised: 09/15/2011 Document Reviewed: 05/26/2011 °Exit[employer]® Patient Information ©2014 [employer]. ° °

## 2013-08-31 DIAGNOSIS — E782 Mixed hyperlipidemia: Secondary | ICD-10-CM | POA: Diagnosis not present

## 2013-08-31 DIAGNOSIS — R7301 Impaired fasting glucose: Secondary | ICD-10-CM | POA: Diagnosis not present

## 2013-08-31 DIAGNOSIS — R39198 Other difficulties with micturition: Secondary | ICD-10-CM | POA: Diagnosis not present

## 2013-08-31 DIAGNOSIS — M159 Polyosteoarthritis, unspecified: Secondary | ICD-10-CM | POA: Diagnosis not present

## 2013-08-31 DIAGNOSIS — R1310 Dysphagia, unspecified: Secondary | ICD-10-CM | POA: Diagnosis not present

## 2013-08-31 DIAGNOSIS — M549 Dorsalgia, unspecified: Secondary | ICD-10-CM | POA: Diagnosis not present

## 2013-09-12 DIAGNOSIS — K219 Gastro-esophageal reflux disease without esophagitis: Secondary | ICD-10-CM | POA: Diagnosis not present

## 2013-09-12 DIAGNOSIS — R1319 Other dysphagia: Secondary | ICD-10-CM | POA: Diagnosis not present

## 2013-09-15 DIAGNOSIS — R1319 Other dysphagia: Secondary | ICD-10-CM | POA: Diagnosis not present

## 2013-09-15 DIAGNOSIS — K219 Gastro-esophageal reflux disease without esophagitis: Secondary | ICD-10-CM | POA: Diagnosis not present

## 2013-09-15 DIAGNOSIS — R1310 Dysphagia, unspecified: Secondary | ICD-10-CM | POA: Diagnosis not present

## 2013-09-15 DIAGNOSIS — K2270 Barrett's esophagus without dysplasia: Secondary | ICD-10-CM | POA: Diagnosis not present

## 2013-09-19 DIAGNOSIS — R7301 Impaired fasting glucose: Secondary | ICD-10-CM | POA: Diagnosis not present

## 2013-09-19 DIAGNOSIS — E782 Mixed hyperlipidemia: Secondary | ICD-10-CM | POA: Diagnosis not present

## 2013-09-19 DIAGNOSIS — K2270 Barrett's esophagus without dysplasia: Secondary | ICD-10-CM | POA: Diagnosis not present

## 2013-09-19 DIAGNOSIS — M159 Polyosteoarthritis, unspecified: Secondary | ICD-10-CM | POA: Diagnosis not present

## 2013-11-22 DIAGNOSIS — E782 Mixed hyperlipidemia: Secondary | ICD-10-CM | POA: Diagnosis not present

## 2013-11-30 DIAGNOSIS — M159 Polyosteoarthritis, unspecified: Secondary | ICD-10-CM | POA: Diagnosis not present

## 2013-11-30 DIAGNOSIS — E782 Mixed hyperlipidemia: Secondary | ICD-10-CM | POA: Diagnosis not present

## 2014-01-05 ENCOUNTER — Encounter (HOSPITAL_COMMUNITY): Payer: Self-pay | Admitting: Emergency Medicine

## 2014-01-05 ENCOUNTER — Emergency Department (HOSPITAL_COMMUNITY)
Admission: EM | Admit: 2014-01-05 | Discharge: 2014-01-05 | Disposition: A | Discharge status: Home / Self Care | Payer: Managed Care, Other (non HMO) | Attending: Emergency Medicine | Admitting: Emergency Medicine

## 2014-01-05 ENCOUNTER — Emergency Department (HOSPITAL_COMMUNITY): Payer: Managed Care, Other (non HMO)

## 2014-01-05 DIAGNOSIS — R51 Headache: Secondary | ICD-10-CM | POA: Insufficient documentation

## 2014-01-05 DIAGNOSIS — Z79899 Other long term (current) drug therapy: Secondary | ICD-10-CM | POA: Insufficient documentation

## 2014-01-05 DIAGNOSIS — E785 Hyperlipidemia, unspecified: Secondary | ICD-10-CM | POA: Diagnosis not present

## 2014-01-05 DIAGNOSIS — R432 Parageusia: Secondary | ICD-10-CM | POA: Diagnosis not present

## 2014-01-05 DIAGNOSIS — K146 Glossodynia: Secondary | ICD-10-CM | POA: Insufficient documentation

## 2014-01-05 DIAGNOSIS — K219 Gastro-esophageal reflux disease without esophagitis: Secondary | ICD-10-CM | POA: Diagnosis not present

## 2014-01-05 DIAGNOSIS — Z792 Long term (current) use of antibiotics: Secondary | ICD-10-CM | POA: Diagnosis not present

## 2014-01-05 DIAGNOSIS — Z7982 Long term (current) use of aspirin: Secondary | ICD-10-CM | POA: Insufficient documentation

## 2014-01-05 DIAGNOSIS — R519 Headache, unspecified: Secondary | ICD-10-CM

## 2014-01-05 DIAGNOSIS — Z7951 Long term (current) use of inhaled steroids: Secondary | ICD-10-CM | POA: Insufficient documentation

## 2014-01-05 DIAGNOSIS — R001 Bradycardia, unspecified: Secondary | ICD-10-CM | POA: Diagnosis not present

## 2014-01-05 DIAGNOSIS — G4489 Other headache syndrome: Secondary | ICD-10-CM | POA: Diagnosis not present

## 2014-01-05 DIAGNOSIS — M545 Low back pain: Secondary | ICD-10-CM | POA: Diagnosis not present

## 2014-01-05 LAB — COMPREHENSIVE METABOLIC PANEL
ALT: 16 U/L (ref 0–53)
AST: 21 U/L (ref 0–37)
Albumin: 3.9 g/dL (ref 3.5–5.2)
Alkaline Phosphatase: 74 U/L (ref 39–117)
Anion gap: 13 (ref 5–15)
BUN: 12 mg/dL (ref 6–23)
CO2: 24 mEq/L (ref 19–32)
Calcium: 8.9 mg/dL (ref 8.4–10.5)
Chloride: 103 mEq/L (ref 96–112)
Creatinine, Ser: 0.92 mg/dL (ref 0.50–1.35)
GFR calc Af Amer: >90 mL/min (ref >90)
GFR calc non Af Amer: 82 mL/min — ABNORMAL LOW (ref >90)
Glucose, Bld: 98 mg/dL (ref 70–99)
Potassium: 3.8 mEq/L (ref 3.7–5.3)
Sodium: 140 mEq/L (ref 137–147)
Total Bilirubin: 0.4 mg/dL (ref 0.3–1.2)
Total Protein: 6.9 g/dL (ref 6.0–8.3)

## 2014-01-05 LAB — DIFFERENTIAL
Basophils Absolute: 0 10*3/uL (ref 0.0–0.1)
Basophils Relative: 0 % (ref 0–1)
Eosinophils Absolute: 0.3 10*3/uL (ref 0.0–0.7)
Eosinophils Relative: 5 % (ref 0–5)
Lymphocytes Relative: 44 % (ref 12–46)
Lymphs Abs: 2.1 10*3/uL (ref 0.7–4.0)
Monocytes Absolute: 0.6 10*3/uL (ref 0.1–1.0)
Monocytes Relative: 11 % (ref 3–12)
Neutro Abs: 2 10*3/uL (ref 1.7–7.7)
Neutrophils Relative %: 40 % — ABNORMAL LOW (ref 43–77)

## 2014-01-05 LAB — URINALYSIS, ROUTINE W REFLEX MICROSCOPIC
Bilirubin Urine: NEGATIVE
Glucose, UA: NEGATIVE mg/dL
Hgb urine dipstick: NEGATIVE
Ketones, ur: NEGATIVE mg/dL
Leukocytes, UA: NEGATIVE
Nitrite: NEGATIVE
Protein, ur: NEGATIVE mg/dL
Specific Gravity, Urine: 1.018 (ref 1.005–1.030)
Urobilinogen, UA: 0.2 mg/dL (ref 0.0–1.0)
pH: 5.5 (ref 5.0–8.0)

## 2014-01-05 LAB — CBC
HCT: 39.1 % (ref 39.0–52.0)
Hemoglobin: 12.4 g/dL — ABNORMAL LOW (ref 13.0–17.0)
MCH: 23 pg — ABNORMAL LOW (ref 26.0–34.0)
MCHC: 31.7 g/dL (ref 30.0–36.0)
MCV: 72.7 fL — ABNORMAL LOW (ref 78.0–100.0)
Platelets: 286 10*3/uL (ref 150–400)
RBC: 5.38 MIL/uL (ref 4.22–5.81)
RDW: 14.2 % (ref 11.5–15.5)
WBC: 5 10*3/uL (ref 4.0–10.5)

## 2014-01-05 LAB — APTT: aPTT: 33 seconds (ref 24–37)

## 2014-01-05 LAB — PROTIME-INR
INR: 1.09 (ref 0.00–1.49)
Prothrombin Time: 14.3 seconds (ref 11.6–15.2)

## 2014-01-05 LAB — ETHANOL: Alcohol, Ethyl (B): <11 mg/dL (ref 0–11)

## 2014-01-05 MED ORDER — TRAMADOL HCL 50 MG PO TABS
50.0000 mg | ORAL_TABLET | Freq: Four times a day (QID) | ORAL | Status: DC | PRN
Start: 2014-01-05 — End: 2015-11-22

## 2014-01-05 MED ORDER — ACETAMINOPHEN 325 MG PO TABS
650.0000 mg | ORAL_TABLET | Freq: Once | ORAL | Status: AC
  Administered 2014-01-05: 650 mg via ORAL
  Filled 2014-01-05: qty 2

## 2014-01-05 NOTE — ED Notes (Signed)
Patient is alert and oriented x3.  He has multiple complaints from his tounge stinging,  Itching all over, and right flank pain that started for over a week

## 2014-01-05 NOTE — ED Notes (Signed)
Pt presents with multiple complaints. Pt reports that the tip of his tongue is burning, denies any trouble breathing. Pt also reports a pain in his right side, pointing to his right rib cage area. Pt also reporting that he has a headache that comes and goes and also says his right hand is itching.

## 2014-01-05 NOTE — Discharge Instructions (Signed)

## 2014-01-05 NOTE — ED Provider Notes (Signed)
CSN: [id number]     Arrival date & time 01/05/14  0103 History   First MD Initiated Contact with Patient 01/05/14 0232     Chief Complaint  Patient presents with  . Headache  . Tongue burning      (Consider location/radiation/quality/duration/timing/severity/associated sxs/prior Treatment) HPI  Juan [name] is a(n) 73 y.o. male who presents to the ED with CC of headache.Patient is a poor historian. History is limited by the mental capacity of the patient, patient's ability to communicate effectively, and overall poor insight. He states that he has had an intermittent HA since Monday with associated vision change, facial droop,difficulty swallowing, Weakness on the Left Side, weakness of the R side. Paresthesia in BL hands that come and go,  And R flank pain he gets these headaches frequently. He also c/o dysguesia. He denies any current sxs. He has a history of hyperlipidemia but denies a history of hypertension, or diabetes. He denies a history of smoking. Denies a history of flank pain, urinary symptoms, previous kidney stones. He has had intermittent muscular lower back pain. He says his back pain feels similar to previous. The patient has Past Medical History  Diagnosis Date  . Reflux    Past Surgical History  Procedure Laterality Date  . Shoulder arthroscopy w/ superior labral anterior posterior lesion repair     No family history on file. History  Substance Use Topics  . Smoking status: Never Smoker   . Smokeless tobacco: Not on file  . Alcohol Use: No    Review of Systems  Ten systems reviewed and are negative for acute change, except as noted in the HPI.    Allergies  Review of patient's allergies indicates no known allergies.  Home Medications   Prior to Admission medications   Medication Sig Start Date End Date Taking? Authorizing Provider  albuterol (PROVENTIL HFA;VENTOLIN HFA) 108 (90 BASE) MCG/ACT inhaler Inhale 2 puffs into the lungs every 4 (four) hours as  needed for wheezing. 03/23/12   Janice Norrie, MD  aspirin EC 81 MG tablet Take 81 mg by mouth daily.    Historical Provider, MD  atorvastatin (LIPITOR) 20 MG tablet Take 1 tablet by mouth daily. 11/30/13   Historical Provider, MD  benzonatate (TESSALON) 100 MG capsule Take 1 capsule (100 mg total) by mouth 3 (three) times daily as needed for cough (DO NOT CHEW!). 03/23/12   Janice Norrie, MD  ciprofloxacin-dexamethasone (CIPRODEX) otic suspension Place 4 drops into the right ear 2 (two) times daily. 07/17/12   Adlih Moreno-Coll, MD  fluticasone (FLONASE) 50 MCG/ACT nasal spray Place 2 sprays into the nose daily. 07/17/12   Adlih Moreno-Coll, MD  loratadine (CLARITIN) 10 MG tablet Take 1 tablet (10 mg total) by mouth daily. 07/17/12   Adlih Moreno-Coll, MD  omeprazole (PRILOSEC) 40 MG capsule Take 1 capsule by mouth daily. 12/18/13   Historical Provider, MD  traMADol (ULTRAM) 50 MG tablet Take 1 tablet (50 mg total) by mouth every 6 (six) hours as needed for pain. 07/17/12   Adlih Moreno-Coll, MD   BP 139/77  Pulse 53  Temp(Src) 98 F (36.7 C) (Oral)  Resp 18  SpO2 97% Physical Exam  Nursing note and vitals reviewed. Constitutional: He appears well-developed and well-nourished. No distress.  HENT:  Head: Normocephalic and atraumatic.  Eyes: Conjunctivae are normal. No scleral icterus.  Neck: Normal range of motion. Neck supple.  Cardiovascular: Normal rate, regular rhythm and normal heart sounds.   Pulmonary/Chest: Effort normal and breath  sounds normal. No respiratory distress.  Abdominal: Soft. There is no tenderness.  Musculoskeletal: He exhibits no edema.  Neurological: He is alert.  Speech is clear and goal oriented, follows commands Major Cranial nerves without deficit, no facial droop Normal strength in upper and lower extremities bilaterally including dorsiflexion and plantar flexion, strong and equal grip strength Sensation normal to light and sharp touch Moves extremities without  ataxia, coordination intact Normal finger to nose and rapid alternating movements Neg romberg, no pronator drift Normal gait Normal heel-shin and balance   Skin: Skin is warm and dry. He is not diaphoretic.  Psychiatric: His behavior is normal.    ED Course  Procedures (including critical care time) Labs Review Labs Reviewed  PROTIME-INR  APTT  CBC  DIFFERENTIAL  COMPREHENSIVE METABOLIC PANEL  ETHANOL  I-STAT TROPOININ, ED    Imaging Review No results found.   EKG Interpretation   Date/Time:  Friday January 05 2014 05:06:17 EDT Ventricular Rate:  48 PR Interval:  211 QRS Duration: 164 QT Interval:  513 QTC Calculation: 458 R Axis:   -56 Text Interpretation:  Sinus bradycardia Left bundle branch block No  significant change since last tracing Confirmed by WARD,  DO, KRISTEN  ([id number]) on 01/05/2014 5:08:34 AM      MDM   Final diagnoses:  None    4:03 AM BP 139/77  Pulse 53  Temp(Src) 98 F (36.7 C) (Oral)  Resp 18  SpO2 97% Patient with neurologic complaints. He responds yes to all neruo questions asked. He has no focal deficits on examination.   5:35 AM Patient seen in shared visit with Dr. Leonides Schanz.  He gives very different history to providers (see dr. Guido Sander note) The patient has an ABCD2 score of 1 and a negative head CT. I highly doubt TIA or stroke. Pt HA treated and improved while in ED.  Presentation is  non concerning for [facility], ICH, Meningitis, or temporal arteritis. Pt is afebrile with no focal neuro deficits, nuchal rigidity, or change in vision. Pt is to follow up with PCP. Pt verbalizes understanding and is agreeable with plan to dc.      Margarita Mail, PA-C 01/08/14 2211

## 2014-01-05 NOTE — ED Notes (Signed)
Patient is alert and oriented x3.  He was given DC instructions and follow up visit instructions.  Patient gave verbal understanding.  He was DC ambulatory under his own power to home.  V/S stable.  He was not showing any signs of distress on DC 

## 2014-01-05 NOTE — ED Provider Notes (Signed)
Medical screening examination/treatment/procedure(s) were conducted as a shared visit with non-physician practitioner(s) and myself.  I personally evaluated the patient during the encounter.   EKG Interpretation   Date/Time:  Friday January 05 2014 05:06:17 EDT Ventricular Rate:  48 PR Interval:  211 QRS Duration: 164 QT Interval:  513 QTC Calculation: 458 R Axis:   -56 Text Interpretation:  Sinus bradycardia Left bundle branch block No  significant change since last tracing Confirmed by Keaira Whitehurst,  DO, Kaynen Minner  ([id number]) on 01/05/2014 5:08:34 AM      Patient is a 73 y.o. M with history of GERD who presents to the emergency department with multiple vague complaints. He reports he has had burning at the tip of his tongue, intermittent chronic headaches that are gradual in onset that he has had before, right flank pain, left pain. No chest pain or shortness of breath. He has had tingling in bilateral hands occasionally but none currently. No focal weakness. On exam, patient is hemodynamically stable. Heart and lung sounds normal. Abdomen soft nontender. No lesions noted skin. No back pain with palpation. Full range of motion in all joints without joint effusion. He is neurologically intact. Unclear etiology for patient's vague symptoms but his workup in the emergency department has been negative. I feel he is safe to be discharged home with outpatient followup.  [place], DO 01/05/14 0710

## 2014-01-15 DIAGNOSIS — E782 Mixed hyperlipidemia: Secondary | ICD-10-CM | POA: Diagnosis not present

## 2014-01-15 DIAGNOSIS — R21 Rash and other nonspecific skin eruption: Secondary | ICD-10-CM | POA: Diagnosis not present

## 2014-01-15 DIAGNOSIS — G609 Hereditary and idiopathic neuropathy, unspecified: Secondary | ICD-10-CM | POA: Diagnosis not present

## 2014-01-15 DIAGNOSIS — R7301 Impaired fasting glucose: Secondary | ICD-10-CM | POA: Diagnosis not present

## 2014-01-24 DIAGNOSIS — R7301 Impaired fasting glucose: Secondary | ICD-10-CM | POA: Diagnosis not present

## 2014-01-24 DIAGNOSIS — E782 Mixed hyperlipidemia: Secondary | ICD-10-CM | POA: Diagnosis not present

## 2014-01-24 DIAGNOSIS — R21 Rash and other nonspecific skin eruption: Secondary | ICD-10-CM | POA: Diagnosis not present

## 2014-01-24 DIAGNOSIS — G609 Hereditary and idiopathic neuropathy, unspecified: Secondary | ICD-10-CM | POA: Diagnosis not present

## 2014-03-29 DIAGNOSIS — K219 Gastro-esophageal reflux disease without esophagitis: Secondary | ICD-10-CM | POA: Diagnosis not present

## 2014-03-29 DIAGNOSIS — E538 Deficiency of other specified B group vitamins: Secondary | ICD-10-CM | POA: Diagnosis not present

## 2014-03-29 DIAGNOSIS — B370 Candidal stomatitis: Secondary | ICD-10-CM | POA: Diagnosis not present

## 2014-03-29 DIAGNOSIS — R21 Rash and other nonspecific skin eruption: Secondary | ICD-10-CM | POA: Diagnosis not present

## 2014-06-07 DIAGNOSIS — M150 Primary generalized (osteo)arthritis: Secondary | ICD-10-CM | POA: Diagnosis not present

## 2014-06-07 DIAGNOSIS — E782 Mixed hyperlipidemia: Secondary | ICD-10-CM | POA: Diagnosis not present

## 2014-06-07 DIAGNOSIS — R3912 Poor urinary stream: Secondary | ICD-10-CM | POA: Diagnosis not present

## 2014-06-20 DIAGNOSIS — R7301 Impaired fasting glucose: Secondary | ICD-10-CM | POA: Diagnosis not present

## 2014-06-20 DIAGNOSIS — G609 Hereditary and idiopathic neuropathy, unspecified: Secondary | ICD-10-CM | POA: Diagnosis not present

## 2014-06-20 DIAGNOSIS — E782 Mixed hyperlipidemia: Secondary | ICD-10-CM | POA: Diagnosis not present

## 2014-06-21 ENCOUNTER — Emergency Department (INDEPENDENT_AMBULATORY_CARE_PROVIDER_SITE_OTHER)
Admission: EM | Admit: 2014-06-21 | Discharge: 2014-06-21 | Disposition: A | Discharge status: Nursing Home (Includes ICF) | Payer: Managed Care, Other (non HMO) | Source: Home / Self Care | Attending: Family Medicine | Admitting: Family Medicine

## 2014-06-21 ENCOUNTER — Emergency Department (HOSPITAL_COMMUNITY): Payer: Managed Care, Other (non HMO)

## 2014-06-21 ENCOUNTER — Emergency Department (HOSPITAL_COMMUNITY)
Admission: EM | Admit: 2014-06-21 | Discharge: 2014-06-21 | Disposition: A | Discharge status: Home / Self Care | Payer: Managed Care, Other (non HMO) | Attending: Emergency Medicine | Admitting: Emergency Medicine

## 2014-06-21 ENCOUNTER — Encounter (HOSPITAL_COMMUNITY): Payer: Self-pay | Admitting: *Deleted

## 2014-06-21 ENCOUNTER — Encounter (HOSPITAL_COMMUNITY): Payer: Self-pay | Admitting: Neurology

## 2014-06-21 ENCOUNTER — Emergency Department (INDEPENDENT_AMBULATORY_CARE_PROVIDER_SITE_OTHER): Payer: Managed Care, Other (non HMO)

## 2014-06-21 DIAGNOSIS — K219 Gastro-esophageal reflux disease without esophagitis: Secondary | ICD-10-CM | POA: Insufficient documentation

## 2014-06-21 DIAGNOSIS — R1030 Lower abdominal pain, unspecified: Secondary | ICD-10-CM | POA: Diagnosis not present

## 2014-06-21 DIAGNOSIS — R109 Unspecified abdominal pain: Secondary | ICD-10-CM

## 2014-06-21 DIAGNOSIS — Z7982 Long term (current) use of aspirin: Secondary | ICD-10-CM | POA: Diagnosis not present

## 2014-06-21 DIAGNOSIS — R1084 Generalized abdominal pain: Secondary | ICD-10-CM | POA: Diagnosis not present

## 2014-06-21 DIAGNOSIS — Z79899 Other long term (current) drug therapy: Secondary | ICD-10-CM | POA: Insufficient documentation

## 2014-06-21 LAB — COMPREHENSIVE METABOLIC PANEL
ALT: 19 U/L (ref 0–53)
AST: 22 U/L (ref 0–37)
Albumin: 3.9 g/dL (ref 3.5–5.2)
Alkaline Phosphatase: 70 U/L (ref 39–117)
Anion gap: 6 (ref 5–15)
BUN: 9 mg/dL (ref 6–23)
CO2: 28 mmol/L (ref 19–32)
Calcium: 8.9 mg/dL (ref 8.4–10.5)
Chloride: 105 mmol/L (ref 96–112)
Creatinine, Ser: 0.98 mg/dL (ref 0.50–1.35)
GFR calc Af Amer: >90 mL/min (ref >90)
GFR calc non Af Amer: 79 mL/min — ABNORMAL LOW (ref >90)
Glucose, Bld: 112 mg/dL — ABNORMAL HIGH (ref 70–99)
Potassium: 4.2 mmol/L (ref 3.5–5.1)
Sodium: 139 mmol/L (ref 135–145)
Total Bilirubin: 0.7 mg/dL (ref 0.3–1.2)
Total Protein: 7 g/dL (ref 6.0–8.3)

## 2014-06-21 LAB — CBC WITH DIFFERENTIAL/PLATELET
Basophils Absolute: 0 10*3/uL (ref 0.0–0.1)
Basophils Relative: 0 % (ref 0–1)
Eosinophils Absolute: 0.1 10*3/uL (ref 0.0–0.7)
Eosinophils Relative: 1 % (ref 0–5)
HCT: 41.1 % (ref 39.0–52.0)
Hemoglobin: 12.9 g/dL — ABNORMAL LOW (ref 13.0–17.0)
Lymphocytes Relative: 20 % (ref 12–46)
Lymphs Abs: 1.7 10*3/uL (ref 0.7–4.0)
MCH: 23.3 pg — ABNORMAL LOW (ref 26.0–34.0)
MCHC: 31.4 g/dL (ref 30.0–36.0)
MCV: 74.2 fL — ABNORMAL LOW (ref 78.0–100.0)
Monocytes Absolute: 0.3 10*3/uL (ref 0.1–1.0)
Monocytes Relative: 4 % (ref 3–12)
Neutro Abs: 6.5 10*3/uL (ref 1.7–7.7)
Neutrophils Relative %: 75 % (ref 43–77)
Platelets: 277 10*3/uL (ref 150–400)
RBC: 5.54 MIL/uL (ref 4.22–5.81)
RDW: 14.9 % (ref 11.5–15.5)
WBC: 8.6 10*3/uL (ref 4.0–10.5)

## 2014-06-21 LAB — URINALYSIS, ROUTINE W REFLEX MICROSCOPIC
Bilirubin Urine: NEGATIVE
Glucose, UA: NEGATIVE mg/dL
Hgb urine dipstick: NEGATIVE
Ketones, ur: NEGATIVE mg/dL
Leukocytes, UA: NEGATIVE
Nitrite: NEGATIVE
Protein, ur: NEGATIVE mg/dL
Specific Gravity, Urine: 1.012 (ref 1.005–1.030)
Urobilinogen, UA: 0.2 mg/dL (ref 0.0–1.0)
pH: 6.5 (ref 5.0–8.0)

## 2014-06-21 LAB — LIPASE, BLOOD: Lipase: 25 U/L (ref 11–59)

## 2014-06-21 LAB — POCT URINALYSIS DIP (DEVICE)
Bilirubin Urine: NEGATIVE
Glucose, UA: NEGATIVE mg/dL
Ketones, ur: NEGATIVE mg/dL
Leukocytes, UA: NEGATIVE
Nitrite: NEGATIVE
Protein, ur: NEGATIVE mg/dL
Specific Gravity, Urine: 1.025 (ref 1.005–1.030)
Urobilinogen, UA: 0.2 mg/dL (ref 0.0–1.0)
pH: 6.5 (ref 5.0–8.0)

## 2014-06-21 MED ORDER — DIPHENHYDRAMINE HCL 50 MG/ML IJ SOLN: 25.0000 mg | Freq: Once | INTRAMUSCULAR | Status: DC

## 2014-06-21 MED ORDER — SODIUM CHLORIDE 0.9 % IV SOLN
INTRAVENOUS | Status: DC
  Administered 2014-06-21: 19:00:00 via INTRAVENOUS

## 2014-06-21 MED ORDER — IOHEXOL 300 MG/ML  SOLN
100.0000 mL | Freq: Once | INTRAMUSCULAR | Status: AC | PRN
  Administered 2014-06-21: 100 mL via INTRAVENOUS

## 2014-06-21 MED ORDER — SODIUM CHLORIDE 0.9 % IV BOLUS (SEPSIS)
1000.0000 mL | Freq: Once | INTRAVENOUS | Status: AC
  Administered 2014-06-21: 1000 mL via INTRAVENOUS

## 2014-06-21 MED ORDER — METOCLOPRAMIDE HCL 5 MG/ML IJ SOLN
5.0000 mg | Freq: Once | INTRAMUSCULAR | Status: AC
  Administered 2014-06-21: 5 mg via INTRAVENOUS
  Filled 2014-06-21: qty 2

## 2014-06-21 MED ORDER — IOHEXOL 300 MG/ML  SOLN
25.0000 mL | Freq: Once | INTRAMUSCULAR | Status: AC | PRN
  Administered 2014-06-21: 25 mL via ORAL

## 2014-06-21 MED ORDER — DIPHENHYDRAMINE HCL 50 MG/ML IJ SOLN
12.5000 mg | Freq: Once | INTRAMUSCULAR | Status: AC
  Administered 2014-06-21: 12.5 mg via INTRAVENOUS
  Filled 2014-06-21: qty 1

## 2014-06-21 NOTE — Discharge Instructions (Signed)
As we discussed, you should stick to a clear liquid diet for the next 8-12 hours. If symptoms reoccur, or become suddenly worse or severe, you are to report immediately to [facility] Emergency Room for re-evaluation.   Abdominal Pain Many things can cause abdominal pain. Usually, abdominal pain is not caused by a disease and will improve without treatment. It can often be observed and treated at home. Your health care provider will do a physical exam and possibly order blood tests and X-rays to help determine the seriousness of your pain. However, in many cases, more time must pass before a clear cause of the pain can be found. Before that point, your health care provider may not know if you need more testing or further treatment. HOME CARE INSTRUCTIONS  Monitor your abdominal pain for any changes. The following actions may help to alleviate any discomfort you are experiencing:  Only take over-the-counter or prescription medicines as directed by your health care provider.  Do not take laxatives unless directed to do so by your health care provider.  Try a clear liquid diet (broth, tea, or water) as directed by your health care provider. Slowly move to a bland diet as tolerated. SEEK MEDICAL CARE IF:  You have unexplained abdominal pain.  You have abdominal pain associated with nausea or diarrhea.  You have pain when you urinate or have a bowel movement.  You experience abdominal pain that wakes you in the night.  You have abdominal pain that is worsened or improved by eating food.  You have abdominal pain that is worsened with eating fatty foods.  You have a fever. SEEK IMMEDIATE MEDICAL CARE IF:   Your pain does not go away within 2 hours.  You keep throwing up (vomiting).  Your pain is felt only in portions of the abdomen, such as the right side or the left lower portion of the abdomen.  You pass bloody or black tarry stools. MAKE SURE YOU:  Understand these instructions.    Will watch your condition.   Will get help right away if you are not doing well or get worse.  Document Released: 12/24/2004 Document Revised: 03/21/2013 Document Reviewed: 11/23/2012 [facility] Patient Information 2015 [place], [place]. This information is not intended to replace advice given to you by your health care provider. Make sure you discuss any questions you have with your health care provider.

## 2014-06-21 NOTE — ED Notes (Signed)
Patient taken to CT.

## 2014-06-21 NOTE — ED Provider Notes (Signed)
CSN: [id number]     Arrival date & time 06/21/14  1438 History   First MD Initiated Contact with Patient 06/21/14 1748     Chief Complaint  Patient presents with  . Abdominal Pain     (Consider location/radiation/quality/duration/timing/severity/associated sxs/prior Treatment) HPI Comments: Patient here complaining of abdominal pain began this morning with nonbilious emesis 3. [facility] seen [facility] prior to arrival and sent here for concern for possible bowel obstruction. Denies any diarrhea. No fever or chills. Denies any prior history of bowel obstruction. Does note some urinary frequency. No flank pain noted. Symptoms persistent and no treatment used prior to arrival.  Patient is a 74 y.o. male presenting with abdominal pain. The history is provided by the patient and medical records.  Abdominal Pain   Past Medical History  Diagnosis Date  . Reflux    Past Surgical History  Procedure Laterality Date  . Shoulder arthroscopy w/ superior labral anterior posterior lesion repair    . Hernia repair     No family history on file. History  Substance Use Topics  . Smoking status: Never Smoker   . Smokeless tobacco: Not on file  . Alcohol Use: No    Review of Systems  Gastrointestinal: Positive for abdominal pain.  All other systems reviewed and are negative.     Allergies  Review of patient's allergies indicates no known allergies.  Home Medications   Prior to Admission medications   Medication Sig Start Date End Date Taking? Authorizing Provider  albuterol (PROVENTIL HFA;VENTOLIN HFA) 108 (90 BASE) MCG/ACT inhaler Inhale 2 puffs into the lungs every 4 (four) hours as needed for wheezing. 03/23/12   Rolland Porter, MD  aspirin EC 81 MG tablet Take 81 mg by mouth daily.    Historical Provider, MD  atorvastatin (LIPITOR) 20 MG tablet Take 1 tablet by mouth at bedtime.  11/30/13   Historical Provider, MD  omeprazole (PRILOSEC) 40 MG capsule Take 1 capsule by mouth daily.  12/18/13   Historical Provider, MD  traMADol (ULTRAM) 50 MG tablet Take 1 tablet (50 mg total) by mouth every 6 (six) hours as needed. 01/05/14   Abigail Harris, PA-C   BP 141/81 mmHg  Pulse 51  Temp(Src) 98.5 F (36.9 C) (Oral)  Resp 14  SpO2 100% Physical Exam  Constitutional: He is oriented to person, place, and time. He appears well-developed and well-nourished.  Non-toxic appearance. No distress.  HENT:  Head: Normocephalic and atraumatic.  Eyes: Conjunctivae, EOM and lids are normal. Pupils are equal, round, and reactive to light.  Neck: Normal range of motion. Neck supple. No tracheal deviation present. No thyroid mass present.  Cardiovascular: Normal rate, regular rhythm and normal heart sounds.  Exam reveals no gallop.   No murmur heard. Pulmonary/Chest: Effort normal and breath sounds normal. No stridor. No respiratory distress. He has no decreased breath sounds. He has no wheezes. He has no rhonchi. He has no rales.  Abdominal: Soft. Normal appearance and bowel sounds are normal. He exhibits no distension. There is generalized tenderness. There is no rigidity, no rebound, no guarding and no CVA tenderness.  Musculoskeletal: Normal range of motion. He exhibits no edema or tenderness.  Neurological: He is alert and oriented to person, place, and time. He has normal strength. No cranial nerve deficit or sensory deficit. GCS eye subscore is 4. GCS verbal subscore is 5. GCS motor subscore is 6.  Skin: Skin is warm and dry. No abrasion and no rash noted.  Psychiatric:  He has a normal mood and affect. His speech is normal and behavior is normal.  Nursing note and vitals reviewed.   ED Course  Procedures (including critical care time) Labs Review Labs Reviewed  COMPREHENSIVE METABOLIC PANEL - Abnormal; Notable for the following:    Glucose, Bld 112 (*)    GFR calc non Af Amer 79 (*)    All other components within normal limits  CBC WITH DIFFERENTIAL/PLATELET - Abnormal; Notable for  the following:    Hemoglobin 12.9 (*)    MCV 74.2 (*)    MCH 23.3 (*)    All other components within normal limits  LIPASE, BLOOD    Imaging Review Dg Abd 2 Views  06/21/2014   CLINICAL DATA:  Mid abdominal pain which started this morning.  EXAM: ABDOMEN - 2 VIEW  COMPARISON:  None.  FINDINGS: Nonobstructive bowel gas pattern. Unremarkable colonic stool burden. No pneumoperitoneum, pneumatosis or portal venous gas.  Several vascular calcifications overlie the lower pelvis bilaterally. Several vascular calcifications overlie the left upper abdominal quadrant. No definitive abnormal opacities overlie the expected location of either renal fossa, ureter or the urinary bladder.  No acute osseus abnormalities. Moderate severe degenerative change of the left hip, incompletely evaluated. Mild degenerative change the right hip.  IMPRESSION: Nonobstructive bowel gas pattern. Unremarkable colonic stool burden.   Electronically Signed   By: Sandi Mariscal M.D.   On: 06/21/2014 13:15     EKG Interpretation None      MDM   Final diagnoses:  Abdominal pain    Abdominal CTs results noted and discussed with patient. Denies ingesting any foreign bodies. Return precautions given. Repeat exam at time of discharge remains nonsurgical    Lacretia Leigh, MD 06/21/14 2126

## 2014-06-21 NOTE — ED Notes (Signed)
Pt reports middle abd pain since this morning, went to [facility] and concern for SBO. Pt vomited x 3 today. No nausea at current. Reports 5/10 abd pain. Pt is a x 4. Calm and cooperative.

## 2014-06-21 NOTE — ED Notes (Signed)
Patient returned from CT

## 2014-06-21 NOTE — Discharge Instructions (Signed)
Return here at once for severe persistent abdominal pain with fever, vomiting, or distention   Abdominal Pain Many things can cause abdominal pain. Usually, abdominal pain is not caused by a disease and will improve without treatment. It can often be observed and treated at home. Your health care provider will do a physical exam and possibly order blood tests and X-rays to help determine the seriousness of your pain. However, in many cases, more time must pass before a clear cause of the pain can be found. Before that point, your health care provider may not know if you need more testing or further treatment. HOME CARE INSTRUCTIONS  Monitor your abdominal pain for any changes. The following actions may help to alleviate any discomfort you are experiencing:  Only take over-the-counter or prescription medicines as directed by your health care provider.  Do not take laxatives unless directed to do so by your health care provider.  Try a clear liquid diet (broth, tea, or water) as directed by your health care provider. Slowly move to a bland diet as tolerated. SEEK MEDICAL CARE IF:  You have unexplained abdominal pain.  You have abdominal pain associated with nausea or diarrhea.  You have pain when you urinate or have a bowel movement.  You experience abdominal pain that wakes you in the night.  You have abdominal pain that is worsened or improved by eating food.  You have abdominal pain that is worsened with eating fatty foods.  You have a fever. SEEK IMMEDIATE MEDICAL CARE IF:   Your pain does not go away within 2 hours.  You keep throwing up (vomiting).  Your pain is felt only in portions of the abdomen, such as the right side or the left lower portion of the abdomen.  You pass bloody or black tarry stools. MAKE SURE YOU:  Understand these instructions.   Will watch your condition.   Will get help right away if you are not doing well or get worse.  Document Released:  12/24/2004 Document Revised: 03/21/2013 Document Reviewed: 11/23/2012 [facility] Patient Information 2015 [place], [place]. This information is not intended to replace advice given to you by your health care provider. Make sure you discuss any questions you have with your health care provider.

## 2014-06-21 NOTE — ED Notes (Signed)
Pt  Reports  Low  abd  Pain  Near  [employer]  In nature   With  Interval  Evert  10  secs     Pt  Reports  Nausea  No  Vomiting  Sitting  Upright  On  Exam table  Speaking in  Complete  sentances  And  Is  In no  Severe/ acute  distress

## 2014-06-21 NOTE — ED Provider Notes (Signed)
CSN: [id number]     Arrival date & time 06/21/14  1107 History   First MD Initiated Contact with Patient 06/21/14 1153     Chief Complaint  Patient presents with  . Abdominal Pain   (Consider location/radiation/quality/duration/timing/severity/associated sxs/prior Treatment) HPI Comments: LNBM: this morning  Patient is a 74 y.o. male presenting with abdominal pain. The history is provided by the patient.  Abdominal Pain Pain location:  Periumbilical Pain quality: sharp   Pain radiates to:  Does not radiate Pain severity:  Moderate Onset quality:  Gradual Duration:  1 day (began this morning) Timing:  Intermittent (has had several sharp, intense 10 second episodes) Chronicity:  New Worsened by:  Eating (or drinking fluids) Associated symptoms: anorexia, nausea and vomiting   Associated symptoms: no belching, no chest pain, no chills, no constipation, no cough, no diarrhea, no dysuria, no fatigue, no fever, no flatus, no hematemesis, no hematochezia, no hematuria, no melena, no shortness of breath and no sore throat   Associated symptoms comment:  +reports feeling bloated Risk factors comment:  Remote hx of ventral hernia repair   Past Medical History  Diagnosis Date  . Reflux    Past Surgical History  Procedure Laterality Date  . Shoulder arthroscopy w/ superior labral anterior posterior lesion repair    . Hernia repair     History reviewed. No pertinent family history. History  Substance Use Topics  . Smoking status: Never Smoker   . Smokeless tobacco: Not on file  . Alcohol Use: No    Review of Systems  Constitutional: Positive for appetite change. Negative for fever, chills and fatigue.  HENT: Negative.  Negative for sore throat.   Respiratory: Negative.  Negative for cough and shortness of breath.   Cardiovascular: Negative.  Negative for chest pain.  Gastrointestinal: Positive for nausea, vomiting, abdominal pain, abdominal distention and anorexia. Negative for  diarrhea, constipation, blood in stool, melena, hematochezia, flatus and hematemesis.  Genitourinary: Negative for dysuria and hematuria.    Allergies  Review of patient's allergies indicates no known allergies.  Home Medications   Prior to Admission medications   Medication Sig Start Date End Date Taking? Authorizing Provider  albuterol (PROVENTIL HFA;VENTOLIN HFA) 108 (90 BASE) MCG/ACT inhaler Inhale 2 puffs into the lungs every 4 (four) hours as needed for wheezing. 03/23/12   Rolland Porter, MD  aspirin EC 81 MG tablet Take 81 mg by mouth daily.    Historical Provider, MD  atorvastatin (LIPITOR) 20 MG tablet Take 1 tablet by mouth at bedtime.  11/30/13   Historical Provider, MD  omeprazole (PRILOSEC) 40 MG capsule Take 1 capsule by mouth daily. 12/18/13   Historical Provider, MD  traMADol (ULTRAM) 50 MG tablet Take 1 tablet (50 mg total) by mouth every 6 (six) hours as needed. 01/05/14   Abigail Harris, PA-C   BP 142/88 mmHg  Pulse 59  Temp(Src) 98.5 F (36.9 C) (Oral)  Resp 14  SpO2 98% Physical Exam  Constitutional: He is oriented to person, place, and time. He appears well-developed and well-nourished.  HENT:  Head: Normocephalic and atraumatic.  Eyes: Conjunctivae are normal. No scleral icterus.  Neck: Normal range of motion. Neck supple.  Cardiovascular: Normal rate, regular rhythm and normal heart sounds.   Pulmonary/Chest: Effort normal and breath sounds normal. No respiratory distress. He has no wheezes. He has no rales. He exhibits no tenderness.  Abdominal: Soft. Normal appearance. Bowel sounds are absent. There is tenderness. There is no rigidity, no rebound, no guarding and  no CVA tenderness. Hernia confirmed negative in the right inguinal area and confirmed negative in the left inguinal area.    Region of tenderness along well healed surgical scan. No palpable hernia or fascial defect. +mild distension  Musculoskeletal: Normal range of motion.  Lymphadenopathy:        Right: No inguinal adenopathy present.       Left: No inguinal adenopathy present.  Neurological: He is alert and oriented to person, place, and time.  Skin: Skin is warm and dry.  Psychiatric: He has a normal mood and affect. His behavior is normal.  Nursing note and vitals reviewed.   ED Course  Procedures (including critical care time) Labs Review Labs Reviewed  POCT URINALYSIS DIP (DEVICE) - Abnormal; Notable for the following:    Hgb urine dipstick TRACE (*)    All other components within normal limits    Imaging Review Dg Abd 2 Views  06/21/2014   CLINICAL DATA:  Mid abdominal pain which started this morning.  EXAM: ABDOMEN - 2 VIEW  COMPARISON:  None.  FINDINGS: Nonobstructive bowel gas pattern. Unremarkable colonic stool burden. No pneumoperitoneum, pneumatosis or portal venous gas.  Several vascular calcifications overlie the lower pelvis bilaterally. Several vascular calcifications overlie the left upper abdominal quadrant. No definitive abnormal opacities overlie the expected location of either renal fossa, ureter or the urinary bladder.  No acute osseus abnormalities. Moderate severe degenerative change of the left hip, incompletely evaluated. Mild degenerative change the right hip.  IMPRESSION: Nonobstructive bowel gas pattern. Unremarkable colonic stool burden.   Electronically Signed   By: Sandi Mariscal M.D.   On: 06/21/2014 13:15     MDM   1. Abdominal pain   With all due respect to radiologist's interpretation of patient's films, I question two small air fluid levels on upright abdominal films. ?Early pSBO given patients history of current illness and previous surgery. Upon reviewing results with patient and discussing further evaluation options, despite my recommendation that patient undergo further evaluation in ER today, patient states he does not wish to undergo further evaluation in the ER today. States he has begun to feel a little bit better during his time at [facility] and  wishes to go home. We discussed the implications of this decision and I advised him to stick to a clear liquid diet over the next 12-24 hours. I also reviewed with him reasons to return for urgent re-evaluation. He voices understanding that if symptoms return, intensify, become suddenly worse or severe, he is to report directly to [facility] ER for re-evaluation. He states he has his own transportation and lives only 4-5 miles from hospital. Vital signs are stable upon discharge.     Lutricia Feil, [place] 06/21/14 1348   06/21/2014@ 14:15 (Addendum) As RN was reviewing discharge paperwork with patient, he had return of his symptoms, became very nauseated and had to hunch over onto the bed because of return of abdominal pain. Patient now requests transfer to [facility] ER for further evaluation. Will transfer by shuttle.   Lutricia Feil, [place] 06/21/14 1421

## 2014-06-21 NOTE — ED Notes (Signed)
Npo

## 2014-06-27 DIAGNOSIS — R1031 Right lower quadrant pain: Secondary | ICD-10-CM | POA: Diagnosis not present

## 2014-06-27 DIAGNOSIS — R109 Unspecified abdominal pain: Secondary | ICD-10-CM | POA: Diagnosis not present

## 2014-10-29 DIAGNOSIS — Z125 Encounter for screening for malignant neoplasm of prostate: Secondary | ICD-10-CM | POA: Diagnosis not present

## 2014-10-29 DIAGNOSIS — G609 Hereditary and idiopathic neuropathy, unspecified: Secondary | ICD-10-CM | POA: Diagnosis not present

## 2014-10-29 DIAGNOSIS — R7301 Impaired fasting glucose: Secondary | ICD-10-CM | POA: Diagnosis not present

## 2014-10-29 DIAGNOSIS — E782 Mixed hyperlipidemia: Secondary | ICD-10-CM | POA: Diagnosis not present

## 2014-10-31 DIAGNOSIS — R9431 Abnormal electrocardiogram [ECG] [EKG]: Secondary | ICD-10-CM | POA: Diagnosis not present

## 2014-10-31 DIAGNOSIS — K210 Gastro-esophageal reflux disease with esophagitis: Secondary | ICD-10-CM | POA: Diagnosis not present

## 2014-10-31 DIAGNOSIS — M150 Primary generalized (osteo)arthritis: Secondary | ICD-10-CM | POA: Diagnosis not present

## 2014-10-31 DIAGNOSIS — K2270 Barrett's esophagus without dysplasia: Secondary | ICD-10-CM | POA: Diagnosis not present

## 2014-11-12 DIAGNOSIS — R7309 Other abnormal glucose: Secondary | ICD-10-CM | POA: Diagnosis not present

## 2014-11-12 DIAGNOSIS — Z0000 Encounter for general adult medical examination without abnormal findings: Secondary | ICD-10-CM | POA: Diagnosis not present

## 2014-11-12 DIAGNOSIS — E782 Mixed hyperlipidemia: Secondary | ICD-10-CM | POA: Diagnosis not present

## 2014-11-12 DIAGNOSIS — G609 Hereditary and idiopathic neuropathy, unspecified: Secondary | ICD-10-CM | POA: Diagnosis not present

## 2014-11-12 DIAGNOSIS — K219 Gastro-esophageal reflux disease without esophagitis: Secondary | ICD-10-CM | POA: Diagnosis not present

## 2015-01-29 DIAGNOSIS — K649 Unspecified hemorrhoids: Secondary | ICD-10-CM | POA: Diagnosis not present

## 2015-01-29 DIAGNOSIS — K625 Hemorrhage of anus and rectum: Secondary | ICD-10-CM | POA: Diagnosis not present

## 2015-02-25 DIAGNOSIS — E782 Mixed hyperlipidemia: Secondary | ICD-10-CM | POA: Diagnosis not present

## 2015-02-25 DIAGNOSIS — R7309 Other abnormal glucose: Secondary | ICD-10-CM | POA: Diagnosis not present

## 2015-03-04 DIAGNOSIS — R7301 Impaired fasting glucose: Secondary | ICD-10-CM | POA: Diagnosis not present

## 2015-03-04 DIAGNOSIS — E782 Mixed hyperlipidemia: Secondary | ICD-10-CM | POA: Diagnosis not present

## 2015-03-04 DIAGNOSIS — G609 Hereditary and idiopathic neuropathy, unspecified: Secondary | ICD-10-CM | POA: Diagnosis not present

## 2015-05-16 DIAGNOSIS — N644 Mastodynia: Secondary | ICD-10-CM | POA: Diagnosis not present

## 2015-05-16 DIAGNOSIS — N61 Mastitis without abscess: Secondary | ICD-10-CM | POA: Diagnosis not present

## 2015-06-03 DIAGNOSIS — R7301 Impaired fasting glucose: Secondary | ICD-10-CM | POA: Diagnosis not present

## 2015-06-03 DIAGNOSIS — E782 Mixed hyperlipidemia: Secondary | ICD-10-CM | POA: Diagnosis not present

## 2015-06-10 DIAGNOSIS — E782 Mixed hyperlipidemia: Secondary | ICD-10-CM | POA: Diagnosis not present

## 2015-06-10 DIAGNOSIS — R7309 Other abnormal glucose: Secondary | ICD-10-CM | POA: Diagnosis not present

## 2015-06-10 DIAGNOSIS — K219 Gastro-esophageal reflux disease without esophagitis: Secondary | ICD-10-CM | POA: Diagnosis not present

## 2015-06-10 DIAGNOSIS — G609 Hereditary and idiopathic neuropathy, unspecified: Secondary | ICD-10-CM | POA: Diagnosis not present

## 2015-08-29 DIAGNOSIS — E041 Nontoxic single thyroid nodule: Secondary | ICD-10-CM | POA: Insufficient documentation

## 2015-08-29 DIAGNOSIS — H833X3 Noise effects on inner ear, bilateral: Secondary | ICD-10-CM | POA: Insufficient documentation

## 2015-09-26 DIAGNOSIS — J069 Acute upper respiratory infection, unspecified: Secondary | ICD-10-CM | POA: Diagnosis not present

## 2015-09-26 DIAGNOSIS — R413 Other amnesia: Secondary | ICD-10-CM | POA: Diagnosis not present

## 2015-11-13 ENCOUNTER — Ambulatory Visit
Admission: RE | Admit: 2015-11-13 | Discharge: 2015-11-13 | Disposition: A | Discharge status: Home / Self Care | Payer: Managed Care, Other (non HMO) | Source: Ambulatory Visit | Attending: Gastroenterology | Admitting: Gastroenterology

## 2015-11-13 ENCOUNTER — Other Ambulatory Visit: Payer: Self-pay | Admitting: Gastroenterology

## 2015-11-13 DIAGNOSIS — Z1211 Encounter for screening for malignant neoplasm of colon: Secondary | ICD-10-CM | POA: Diagnosis not present

## 2015-11-13 DIAGNOSIS — R1310 Dysphagia, unspecified: Secondary | ICD-10-CM | POA: Diagnosis not present

## 2015-11-13 DIAGNOSIS — K2289 Other specified disease of esophagus: Secondary | ICD-10-CM

## 2015-11-13 DIAGNOSIS — K228 Other specified diseases of esophagus: Secondary | ICD-10-CM

## 2015-11-13 DIAGNOSIS — K5730 Diverticulosis of large intestine without perforation or abscess without bleeding: Secondary | ICD-10-CM | POA: Diagnosis not present

## 2015-11-13 DIAGNOSIS — K219 Gastro-esophageal reflux disease without esophagitis: Secondary | ICD-10-CM | POA: Diagnosis not present

## 2015-11-13 DIAGNOSIS — R634 Abnormal weight loss: Secondary | ICD-10-CM | POA: Diagnosis not present

## 2015-11-13 DIAGNOSIS — Z800 Family history of malignant neoplasm of digestive organs: Secondary | ICD-10-CM | POA: Diagnosis not present

## 2015-11-13 DIAGNOSIS — K634 Enteroptosis: Secondary | ICD-10-CM | POA: Diagnosis not present

## 2015-11-13 DIAGNOSIS — C159 Malignant neoplasm of esophagus, unspecified: Secondary | ICD-10-CM | POA: Diagnosis not present

## 2015-11-13 MED ORDER — IOPAMIDOL (ISOVUE-300) INJECTION 61%
100.0000 mL | Freq: Once | INTRAVENOUS | Status: AC | PRN
  Administered 2015-11-13: 100 mL via INTRAVENOUS

## 2015-11-20 ENCOUNTER — Other Ambulatory Visit: Payer: Self-pay | Admitting: *Deleted

## 2015-11-20 DIAGNOSIS — C159 Malignant neoplasm of esophagus, unspecified: Secondary | ICD-10-CM

## 2015-11-20 NOTE — Progress Notes (Signed)
Referral from GI. Dr. Burr Medico agrees to see patient today at 2pm. Needs urgent referral to radiation oncology as well. Referral order entered and called radiation oncology scheduler, Javata Epps with referral.

## 2015-11-22 ENCOUNTER — Ambulatory Visit
Admission: RE | Admit: 2015-11-22 | Payer: Managed Care, Other (non HMO) | Source: Ambulatory Visit | Admitting: Radiation Oncology

## 2015-11-22 ENCOUNTER — Encounter: Payer: Self-pay | Admitting: *Deleted

## 2015-11-22 ENCOUNTER — Ambulatory Visit
Admission: RE | Admit: 2015-11-22 | Discharge: 2015-11-22 | Disposition: A | Discharge status: Home / Self Care | Payer: Managed Care, Other (non HMO) | Source: Ambulatory Visit | Attending: Radiation Oncology | Admitting: Radiation Oncology

## 2015-11-22 ENCOUNTER — Encounter: Payer: Self-pay | Admitting: Radiation Oncology

## 2015-11-22 DIAGNOSIS — Z7984 Long term (current) use of oral hypoglycemic drugs: Secondary | ICD-10-CM | POA: Insufficient documentation

## 2015-11-22 DIAGNOSIS — Z800 Family history of malignant neoplasm of digestive organs: Secondary | ICD-10-CM | POA: Insufficient documentation

## 2015-11-22 DIAGNOSIS — C154 Malignant neoplasm of middle third of esophagus: Secondary | ICD-10-CM | POA: Insufficient documentation

## 2015-11-22 DIAGNOSIS — Z7982 Long term (current) use of aspirin: Secondary | ICD-10-CM | POA: Diagnosis not present

## 2015-11-22 DIAGNOSIS — C159 Malignant neoplasm of esophagus, unspecified: Secondary | ICD-10-CM | POA: Insufficient documentation

## 2015-11-22 DIAGNOSIS — R001 Bradycardia, unspecified: Secondary | ICD-10-CM | POA: Diagnosis not present

## 2015-11-22 DIAGNOSIS — M546 Pain in thoracic spine: Secondary | ICD-10-CM | POA: Diagnosis not present

## 2015-11-22 DIAGNOSIS — K219 Gastro-esophageal reflux disease without esophagitis: Secondary | ICD-10-CM | POA: Diagnosis not present

## 2015-11-22 DIAGNOSIS — R634 Abnormal weight loss: Secondary | ICD-10-CM | POA: Insufficient documentation

## 2015-11-22 DIAGNOSIS — R1310 Dysphagia, unspecified: Secondary | ICD-10-CM | POA: Insufficient documentation

## 2015-11-22 HISTORY — DX: Malignant neoplasm of esophagus, unspecified: C15.9

## 2015-11-22 NOTE — Progress Notes (Signed)
Radiation Oncology         [phone]) [phone] ________________________________  Name: Juan [name] MRN: [id number]  Date: 11/22/2015  DOB: 02-Feb-1941  CR:[id number] ([place]) [name], MD (Inactive)  [name], MD     REFERRING PHYSICIAN: [name], MD   DIAGNOSIS: The encounter diagnosis was Malignant neoplasm of middle third of esophagus ([place]).   HISTORY OF PRESENT ILLNESS: Juan [name] is a 75 y.o. male seen at the request of Dr. Collene Mares for a new diagnosis of esophageal cancer. He had noticed dysphagia and odynophagia with 20-25 pounds of weight loss in about 3-4 months. He was seen by Dr. Collene Mares and underwent upper and lower endoscopy. His colonic evaluation was negative for polyps or tumors. Endoscopy revealed a large fungating tumor at 22cm from the incisors within the middle esophagus measuring a length of 5cm. This was biopsied and found to be consistent with a squamous cell carcinoma. A CT of the abdomen and pelvis did not reveal evidence of metastatic disease. He comes today for further recommendations of care with Dr. Lisbeth Renshaw regarding the utility of radiotherapy.     PREVIOUS RADIATION THERAPY: No   PAST MEDICAL HISTORY:  Past Medical History:  Diagnosis Date  . Reflux        PAST SURGICAL HISTORY: Past Surgical History:  Procedure Laterality Date  . HERNIA REPAIR    . SHOULDER ARTHROSCOPY W/ SUPERIOR LABRAL ANTERIOR POSTERIOR LESION REPAIR       FAMILY HISTORY:  Family History  Problem Relation Age of Onset  . Colon cancer Father 55  . Colon cancer Brother 72     SOCIAL HISTORY:  reports that he has never smoked. He does not have any smokeless tobacco history on file. He reports that he does not drink alcohol or use drugs. The patient is married and lives in [place]. He works for a frame business.    ALLERGIES: Review of patient's allergies indicates no known allergies.   MEDICATIONS:  Current Outpatient Prescriptions  Medication Sig Dispense Refill  .  acetaminophen (TYLENOL) 500 MG tablet Take 500 mg by mouth every 6 (six) hours as needed.    Marland Kitchen amitriptyline (ELAVIL) 10 MG tablet Take 10 mg by mouth at bedtime. 11/16/15 - Take 1 tablet at Bedtime x 1 Week, then increase to 2 tablets Once Daily Orally- 30 days    . aspirin EC 81 MG tablet Take 81 mg by mouth daily.    Marland Kitchen atorvastatin (LIPITOR) 20 MG tablet Take 20 mg by mouth at bedtime.     . metFORMIN (GLUCOPHAGE) 500 MG tablet Take 500 mg by mouth daily with breakfast.    . omeprazole (PRILOSEC) 40 MG capsule Take 40 mg by mouth daily.     Marland Kitchen albuterol (PROVENTIL HFA;VENTOLIN HFA) 108 (90 BASE) MCG/ACT inhaler Inhale 2 puffs into the lungs every 4 (four) hours as needed for wheezing. (Patient not taking: Reported on 11/22/2015) 1 Inhaler 0   No current facility-administered medications for this encounter.      REVIEW OF SYSTEMS: On review of systems, the patient reports that he is doing ok overall.  He reports pain in his mid thoracic spine that is along the midline that does not radiate. He denies any neuropathic pain in his extremities. He denies any sternal chest pain, shortness of breath, cough, fevers, chills, night sweats. He has lost about 20-25 lbs in the past few months and continues to have dysphagia and odynphagia with solids and is eating a soft diet.  He  denies any bowel or bladder disturbances, and denies abdominal pain, nausea or vomiting. He denies any new musculoskeletal or joint aches or pains. A complete review of systems is obtained and is otherwise negative.     PHYSICAL EXAM:  height is 5\' 9"  (1.753 m) and weight is 175 lb (79.4 kg). His temperature is 98.1 F (36.7 C). His blood pressure is 153/83 (abnormal) and his pulse is 51 (abnormal).   Pain scale 3/10 In general this is a well appearing African American male in no acute distress. He is alert and oriented x4 and appropriate throughout the examination. HEENT reveals that the patient is normocephalic, atraumatic. EOMs  are intact. PERRLA. Skin is intact without any evidence of gross lesions. Cardiovascular exam reveals a regular rate and rhythm, no clicks rubs or murmurs are auscultated. Chest is clear to auscultation bilaterally. Lymphatic assessment is performed and does reveal palpable adenopathy about 2 cm in greatest dimension on the right supraclavicular region. The remainder of his exam  not reveal any additional adenopathy in the cervical, supraclavicular, axillary, or inguinal chains. Abdomen has active bowel sounds in all quadrants and is intact. The abdomen is soft, non tender, non distended. Lower extremities are negative for pretibial pitting edema, deep calf tenderness, cyanosis or clubbing.   ECOG = 1  0 - Asymptomatic (Fully active, able to carry on all predisease activities without restriction)  1 - Symptomatic but completely ambulatory (Restricted in physically strenuous activity but ambulatory and able to carry out work of a light or sedentary nature. For example, light housework, office work)  2 - Symptomatic, <50% in bed during the day (Ambulatory and capable of all self care but unable to carry out any work activities. Up and about more than 50% of waking hours)  3 - Symptomatic, >50% in bed, but not bedbound (Capable of only limited self-care, confined to bed or chair 50% or more of waking hours)  4 - Bedbound (Completely disabled. Cannot carry on any self-care. Totally confined to bed or chair)  5 - Death   Eustace Pen MM, Creech RH, Tormey DC, et al. [phone]). "Toxicity and response criteria of the [facility]". [place] Oncol. 5 (6): 649-55    LABORATORY DATA:  Lab Results  Component Value Date   WBC 8.6 06/21/2014   HGB 12.9 (L) 06/21/2014   HCT 41.1 06/21/2014   MCV 74.2 (L) 06/21/2014   PLT 277 06/21/2014   Lab Results  Component Value Date   NA 139 06/21/2014   K 4.2 06/21/2014   CL 105 06/21/2014   CO2 28 06/21/2014   Lab Results  Component  Value Date   ALT 19 06/21/2014   AST 22 06/21/2014   ALKPHOS 70 06/21/2014   BILITOT 0.7 06/21/2014      RADIOGRAPHY: Ct Abdomen Pelvis W Contrast  Result Date: 11/13/2015 CLINICAL DATA:  Esophageal mass found on endoscopy. Evaluate for metastasis. EXAM: CT ABDOMEN AND PELVIS WITH CONTRAST TECHNIQUE: Multidetector CT imaging of the abdomen and pelvis was performed using the standard protocol following bolus administration of intravenous contrast. CONTRAST:  146mL ISOVUE-300 IOPAMIDOL (ISOVUE-300) INJECTION 61% COMPARISON:  06/21/2014 FINDINGS: Lower chest: Lung bases are clear. No effusions. Heart is normal size. Hepatobiliary: No focal hepatic abnormality. Gallbladder unremarkable. Pancreas: No focal abnormality or ductal dilatation. Spleen: Calcifications within the spleen compatible with old granulomatous disease. Normal size. Adrenals/Urinary Tract: Adrenal glands normal. Parapelvic cysts within the left kidney. No hydronephrosis urinary bladder decompressed, grossly unremarkable. Stomach/Bowel: Stomach, large  and small bowel grossly unremarkable. Vascular/Lymphatic: Scattered aortic and iliac calcifications with tortuosity. No aneurysm. No adenopathy. Reproductive: No visible focal abnormality. Other: No free fluid or free air. Small bilateral inguinal hernias containing fat. Musculoskeletal: No acute bony abnormality or focal bone lesion. Degenerative changes in the lumbar spine. IMPRESSION: No evidence of metastatic disease in the abdomen or pelvis. 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 Aortoiliac atherosclerosis. Small bilateral inguinal hernias containing fat the Electronically Signed   By: Rolm Baptise M.D.   On: 11/13/2015 11:26       IMPRESSION:  1. Unstaged Squamous Cell Carcinoma of the Esophagus with adenopathy of the right supraclavicular adenopathy. Dr. Lisbeth Renshaw discusses the role for staging imaging to determine his course of therapy. He reviews the rationale for a PET scan to evaluate  extra esophageal disease. We will order this and follow up with the results of this as soon as possible. Dr. Burr Medico will see the patient and we hope to get his scan scheduled for either today or Monday so she has meaningful data to work off of when she sees the patient. Dr. Lisbeth Renshaw discusses a typical scheme for radiotherapy and concurrent chemotherapy and outlines the delivery and logistics of treatment. We discussed the risks, benefits, short, and long term effects of therapy. He is interested in proceeding with the course of 5 1/2 to 6 weeks as this is indicated, and we will proceed with simulation next Monday 8/28. We will also copy Dr. Servando Snare so that he can evaluate the patient radiographically to determine if he is a surgical candidate once we have his PET scan. 2. Dysphagia and Weightloss secondary to #1 . The patient is still able to tolerate soft foods and liquids. We will set him up to meet with our nutritionist as well given his 20 pound weight loss. 3. Mid thoracic back pain. We will follow up with his PET scan also and if disease is noted, consider radiotherapy if metastatic disease is present. 4. Bradycardia. The patient has previously been offered a pacemaker, but has declined as he is asymptomatic.   The above documentation reflects my direct findings during this shared patient visit. Please see the separate note by Dr. Lisbeth Renshaw on this date for the remainder of the patient's plan of care.    Carola Rhine, PAC  This document serves as a record of services personally performed by Shona Simpson, PA-C and Kyung Rudd, MD. It was created on their behalf by Darcus Austin, a trained medical scribe. The creation of this record is based on the scribe's personal observations and the providers' statements to them. This document has been checked and approved by the attending provider.

## 2015-11-22 NOTE — Addendum Note (Signed)
Encounter addended by: Benn Moulder, RN on: 11/22/2015  6:23 PM<BR>    Actions taken: Charge Capture section accepted

## 2015-11-22 NOTE — Progress Notes (Addendum)
GI Location of Tumor / Histology: Esophagus, Squamous Cell Carcinoma  Biopsy 11/13/15  Esophagus Invasive Squamous Cell Carcinoma, Moderately differentiated  Juan [name] presented with difficulty swallowing then had endoscopy and colonoscopy. Has Barrett's Esophagus  Past/Anticipated interventions by surgeon, if any: Biopsy of Esophagus, Dr. Meriel Pica   Past/Anticipated interventions by medical oncology, if any: No  Weight changes, if any: 175 lbs.  Last weight of 196lbs 1 year ago  Bowel/Bladder complaints, if any: none  Nausea / Vomiting, if any: None   Pain issues, if any:  Level 8 pain  Any blood per rectum:  None  SAFETY ISSUES:  Prior radiation? No  Pacemaker/ICD? No  Possible current pregnancy? No  Is the patient on methotrexate? No  Current Complaints/Details:

## 2015-11-25 ENCOUNTER — Ambulatory Visit
Admission: RE | Admit: 2015-11-25 | Discharge: 2015-11-25 | Disposition: A | Discharge status: Home / Self Care | Payer: Managed Care, Other (non HMO) | Source: Ambulatory Visit | Attending: Radiation Oncology | Admitting: Radiation Oncology

## 2015-11-25 DIAGNOSIS — K219 Gastro-esophageal reflux disease without esophagitis: Secondary | ICD-10-CM | POA: Diagnosis not present

## 2015-11-25 DIAGNOSIS — C154 Malignant neoplasm of middle third of esophagus: Secondary | ICD-10-CM

## 2015-11-25 DIAGNOSIS — R634 Abnormal weight loss: Secondary | ICD-10-CM | POA: Diagnosis not present

## 2015-11-25 DIAGNOSIS — Z7984 Long term (current) use of oral hypoglycemic drugs: Secondary | ICD-10-CM | POA: Diagnosis not present

## 2015-11-25 DIAGNOSIS — Z7982 Long term (current) use of aspirin: Secondary | ICD-10-CM | POA: Diagnosis not present

## 2015-11-25 DIAGNOSIS — Z800 Family history of malignant neoplasm of digestive organs: Secondary | ICD-10-CM | POA: Diagnosis not present

## 2015-11-25 DIAGNOSIS — M546 Pain in thoracic spine: Secondary | ICD-10-CM | POA: Diagnosis not present

## 2015-11-25 DIAGNOSIS — R001 Bradycardia, unspecified: Secondary | ICD-10-CM | POA: Diagnosis not present

## 2015-11-25 DIAGNOSIS — R1310 Dysphagia, unspecified: Secondary | ICD-10-CM | POA: Diagnosis not present

## 2015-11-25 MED ORDER — OXYCODONE-ACETAMINOPHEN 5-325 MG PO TABS: 1.0000 | ORAL_TABLET | Freq: Four times a day (QID) | ORAL | 0 refills | Status: DC | PRN

## 2015-11-25 NOTE — Progress Notes (Signed)
  Radiation Oncology         [phone] ________________________________  Name: Decoda Delmastro MRN: [id number]  Date: 11/25/2015  DOB: 06/02/1940  SIMULATION AND TREATMENT PLANNING NOTE  DIAGNOSIS:     ICD-9-CM ICD-10-CM   1. Malignant neoplasm of middle third of esophagus ([place]) 150.4 C15.4      Site:  Chest  NARRATIVE:  The patient was brought to the [facility].  Identity was confirmed.  All relevant records and images related to the planned course of therapy were reviewed.   Written consent to proceed with treatment was confirmed which was freely given after reviewing the details related to the planned course of therapy had been reviewed with the patient.  Then, the patient was set-up in a stable reproducible  supine position for radiation therapy.  CT images were obtained.  Surface markings were placed.    Medically necessary complex treatment device(s) for immobilization:  none.   The CT images were loaded into the planning software.  Then the target and avoidance structures were contoured.  Treatment planning then occurred.  The radiation prescription was entered and confirmed.   I have requested : Intensity Modulated Radiotherapy (IMRT) is medically necessary for this case for the following reason:  Adequate sparing of nearby adjacent critical structures including the spinal cord and heart.   The patient will undergo daily image guidance to ensure accurate localization of the target, and adequate minimize dose to the normal surrounding structures in close proximity to the target.   PLAN:  The patient will receive 50 Gy in 25 fractions to the gross tumor volume initially using a simultaneous integrated boost technique. The lower dose region will receive 45 gray in 25 fractions. It is anticipated that a 10 gray and 9 gray boost respectively will be delivered subsequently.   Special treatment procedure The patient will also receive concurrent chemotherapy during the  treatment. The patient may therefore experience increased toxicity or side effects and the patient will be monitored for such problems. This may require extra lab work as necessary. This therefore constitutes a special treatment procedure.   ________________________________   Jodelle Gross, MD, PhD

## 2015-11-26 ENCOUNTER — Encounter: Payer: Self-pay | Admitting: *Deleted

## 2015-11-26 ENCOUNTER — Encounter: Payer: Self-pay | Admitting: Hematology

## 2015-11-26 ENCOUNTER — Ambulatory Visit (HOSPITAL_BASED_OUTPATIENT_CLINIC_OR_DEPARTMENT_OTHER): Payer: Managed Care, Other (non HMO) | Admitting: Hematology

## 2015-11-26 ENCOUNTER — Telehealth: Payer: Self-pay | Admitting: Hematology

## 2015-11-26 VITALS — BP 107/73 | HR 63 | Temp 98.6°F | Resp 18 | Ht 69.0 in | Wt 175.0 lb

## 2015-11-26 DIAGNOSIS — R1310 Dysphagia, unspecified: Secondary | ICD-10-CM

## 2015-11-26 DIAGNOSIS — C154 Malignant neoplasm of middle third of esophagus: Secondary | ICD-10-CM

## 2015-11-26 NOTE — Telephone Encounter (Signed)
AVS REPORT AND SCHEDULE GIVEN PER 11/26/15 LOS.

## 2015-11-26 NOTE — Progress Notes (Signed)
Oncology Nurse Navigator Documentation  Oncology Nurse Navigator Flowsheets 11/26/2015  Navigator Location [facility]Med [facility]  Navigator Encounter Type Initial MedOnc  Abnormal Finding Date 11/13/2015  Confirmed Diagnosis Date 11/13/2015  Patient Visit Type MedOnc;Initial  Treatment Phase Pre-Tx/Tx Discussion  Barriers/Navigation Needs Education  Education Understanding Cancer/ Treatment Options;Coping with Diagnosis/ Prognosis;Pain/ Symptom Management;Newly Diagnosed Cancer Education;Preparing for Upcoming  Treatment  Interventions Education Method  Education Method Verbal;Written;Teach-back  Support Groups/Services GI Support Group;[place] calendar  Acuity Level 2  Time Spent with Patient 56  Met with patient and wife, [name] during new patient visit. Explained the role of the GI Nurse Navigator and provided New Patient Packet with information on: 1. Esophagus cancer-- 2. Support groups 3. Advanced Directives 4. Fall Safety Plan Answered questions, reviewed current treatment plan using TEACH back and provided emotional support. Provided copy of current treatment plan. Discussed need for high calorie/high protein diet, sitting up after eating and making sure foods are soft. He will meet with dietician tomorrow and have his lab work afterwards. Explained what the EUS procedure is and why this is important as well as the PET scan ordered by Dr. Lisbeth Renshaw today. 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 He 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 and wants to work as long as he can. Encouraged him to try if he wishes, but to let us know if he needs to take time off and to get is FMLA paperwork in soon. Encouraged him to go ahead and get the forms from employer to have if needed. Dr. Burr Medico did discuss with him the potential need for a feeding tube and navigator encouraged him to let us know early if he is having trouble  swallowing soft foods and liquids.   Merceda Elks, RN, BSN GI Oncology [place]

## 2015-11-26 NOTE — Progress Notes (Signed)
[facility]  Telephone:([phone]) [phone] Fax:(336) [phone]  Clinic New Consult Note   Patient Care Team: Rama ([place]) [name], MD (Inactive) as PCP - General (Internal Medicine) 11/26/2015  Referring physician: Dr. Collene Mares  CHIEF COMPLAINTS/PURPOSE OF CONSULTATION:  Newly diagnosed esophageal squamous cell carcinoma  Oncology History   Presented with dysphagia and odynophagia with 20-25 pounds of weight loss in about 3-4 months     Esophageal cancer ([place])   11/13/2015 Procedure    UPPER ENDOSCOPY per Dr. Collene Mares: Large fungating, friable bleeding mass in middle third of esophagus, 22cm from incisors and extended to 27cm. Non-obstructing.      11/13/2015 Pathology Results    Invasive squamous cell carcinoma; moderately differentiated      11/13/2015 Imaging    CT ABD/PELVIS: IMPRESSION:No evidence of metastatic disease in the abdomen or pelvis. 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 Aortoiliac atherosclerosis. Small bilateral inguinal hernias containing fat the      11/22/2015 Initial Diagnosis    Esophageal cancer ([facility])      HISTORY OF PRESENTING ILLNESS:  Juan [name] 75 y.o. male is here because of His recently diagnosed esophageal cancer. He is accompanied by his wife to my clinic today.  He started having sore throat and dysphagia about 4-5 months ago, and was seen by PCP, had 2 course antibiotics but is symptom progressed. He was referred to gastroenterologist Dr. Suezanne Cheshire, and underwent EGD on 11/13/2015, which showed a large fungating, friable bleeding mass in the middle third of esophagus, 22 cm to 27 cm from incisors, non-obstructing. The biopsy showed squamous cell carcinoma. His CT abdomen and pelvis was negative for metastatic disease.  He has lost 20lbs, he is on soft diet and liquids, his pain is about 7/10 pain with swallowing. He also has chest pain , intermittent, it lasts about a few minutes, no nausea or vomiting, his bowel movement is normal. He  denies melena or hematochezia. He is able to function and tolerated light activities at home.   MEDICAL HISTORY:  Past Medical History:  Diagnosis Date  . Esophageal cancer ([place])   . Reflux     SURGICAL HISTORY: Past Surgical History:  Procedure Laterality Date  . HERNIA REPAIR    . SHOULDER ARTHROSCOPY W/ SUPERIOR LABRAL ANTERIOR POSTERIOR LESION REPAIR      SOCIAL HISTORY: Social History   Social History  . Marital status: Married    Spouse name: N/A  . Number of children: N/A  . Years of education: N/A   Occupational History  . Not on file.   Social History Main Topics  . Smoking status: Former Smoker    Packs/day: 2.00    Years: 40.00    Quit date: 03/31/1999  . Smokeless tobacco: Never Used  . Alcohol use No     Comment: weekend binge drinking for 30-40 years, quit in 2001  . Drug use: No  . Sexual activity: Not on file   Other Topics Concern  . 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 Colon cancer Father 71  . Colon cancer Brother 23    ALLERGIES:  has No Known Allergies.  MEDICATIONS:  Current Outpatient Prescriptions  Medication Sig Dispense Refill  . acetaminophen (TYLENOL)  500 MG tablet Take 500 mg by mouth every 6 (six) hours as needed.    Marland Kitchen albuterol (PROVENTIL HFA;VENTOLIN HFA) 108 (90 BASE) MCG/ACT inhaler Inhale 2 puffs into the lungs every 4 (four) hours as needed for wheezing. (Patient not taking: Reported on 11/22/2015) 1 Inhaler 0  . amitriptyline (ELAVIL) 10 MG tablet Take 10 mg by mouth at bedtime. 11/16/15 - Take 1 tablet at Bedtime x 1 Week, then increase to 2 tablets Once Daily Orally- 30 days    . aspirin EC 81 MG tablet Take 81 mg by mouth daily.    Marland Kitchen atorvastatin (LIPITOR) 20 MG tablet Take  20 mg by mouth at bedtime.     . metFORMIN (GLUCOPHAGE) 500 MG tablet Take 500 mg by mouth daily with breakfast.    . omeprazole (PRILOSEC) 40 MG capsule Take 40 mg by mouth daily.     Marland Kitchen oxyCODONE-acetaminophen (PERCOCET/ROXICET) 5-325 MG tablet Take 1-2 tablets by mouth every 6 (six) hours as needed for severe pain. 40 tablet 0   No current facility-administered medications for this visit.     REVIEW OF SYSTEMS:   Constitutional: Denies fevers, chills or abnormal night sweats Eyes: Denies blurriness of vision, double vision or watery eyes Ears, nose, mouth, throat, and face: Denies mucositis or sore throat Respiratory: Denies cough, dyspnea or wheezes Cardiovascular: Denies palpitation, chest discomfort or lower extremity swelling Gastrointestinal:  Denies nausea, heartburn or change in bowel habits Skin: Denies abnormal skin rashes Lymphatics: Denies new lymphadenopathy or easy bruising Neurological:Denies numbness, tingling or new weaknesses Behavioral/Psych: Mood is stable, no new changes  All other systems were reviewed with the patient and are negative.  PHYSICAL EXAMINATION: ECOG PERFORMANCE STATUS: 1 - Symptomatic but completely ambulatory  Vitals:   11/26/15 1504  BP: 107/73  Pulse: 63  Resp: 18  Temp: 98.6 F (37 C)   Filed Weights   11/26/15 1504  Weight: 175 lb (79.4 kg)    GENERAL:alert, no distress and comfortable SKIN: skin color, texture, turgor are normal, no rashes or significant lesions EYES: normal, conjunctiva are pink and non-injected, sclera clear OROPHARYNX:no exudate, no erythema and lips, buccal mucosa, and tongue normal  NECK: supple, thyroid normal size, non-tender, without nodularity LYMPH:  no palpable lymphadenopathy in the cervical, axillary or inguinal LUNGS: clear to auscultation and percussion with normal breathing effort HEART: regular rate & rhythm and no murmurs and no lower extremity edema ABDOMEN:abdomen soft, non-tender and normal  bowel sounds Musculoskeletal:no cyanosis of digits and no clubbing  PSYCH: alert & oriented x 3 with fluent speech NEURO: no focal motor/sensory deficits  LABORATORY DATA:  I have reviewed the data as listed CBC Latest Ref Rng & Units 11/27/2015 06/21/2014 01/05/2014  WBC 4.0 - 10.3 10e3/uL 5.1 8.6 5.0  Hemoglobin 13.0 - 17.1 g/dL 12.0(L) 12.9(L) 12.4(L)  Hematocrit 38.4 - 49.9 % 39.3 41.1 39.1  Platelets 140 - 400 10e3/uL 261 277 286   CMP Latest Ref Rng & Units 11/27/2015 06/21/2014 01/05/2014  Glucose 70 - 140 mg/dl 89 112(H) 98  BUN 7.0 - 26.0 mg/dL 8.4 9 12   Creatinine 0.7 - 1.3 mg/dL 0.8 0.98 0.92  Sodium 136 - 145 mEq/L 139 139 140  Potassium 3.5 - 5.1 mEq/L 4.2 4.2 3.8  Chloride 96 - 112 mmol/L - 105 103  CO2 22 - 29 mEq/L 27 28 24   Calcium 8.4 - 10.4 mg/dL 9.2 8.9 8.9  Total Protein 6.4 - 8.3 g/dL 7.0 7.0 6.9  Total Bilirubin 0.20 - 1.20  mg/dL 0.43 0.7 0.4  Alkaline Phos 40 - 150 U/L 77 70 74  AST 5 - 34 U/L 12 22 21   ALT 0 - 55 U/L 10 19 16    PATHOLOGY REPORT  Final microscopic diagnosis Esophagus, 0.2 cm to 27 cm, biopsy -Invasive squamous cell carcinoma, moderately differentiated   RADIOGRAPHIC STUDIES: I have personally reviewed the radiological images as listed and agreed with the findings in the report. Ct Abdomen Pelvis W Contrast  Result Date: 11/13/2015 CLINICAL DATA:  Esophageal mass found on endoscopy. Evaluate for metastasis. EXAM: CT ABDOMEN AND PELVIS WITH CONTRAST TECHNIQUE: Multidetector CT imaging of the abdomen and pelvis was performed using the standard protocol following bolus administration of intravenous contrast. CONTRAST:  157mL ISOVUE-300 IOPAMIDOL (ISOVUE-300) INJECTION 61% COMPARISON:  06/21/2014 FINDINGS: Lower chest: Lung bases are clear. No effusions. Heart is normal size. Hepatobiliary: No focal hepatic abnormality. Gallbladder unremarkable. Pancreas: No focal abnormality or ductal dilatation. Spleen: Calcifications within the spleen compatible  with old granulomatous disease. Normal size. Adrenals/Urinary Tract: Adrenal glands normal. Parapelvic cysts within the left kidney. No hydronephrosis urinary bladder decompressed, grossly unremarkable. Stomach/Bowel: Stomach, large and small bowel grossly unremarkable. Vascular/Lymphatic: Scattered aortic and iliac calcifications with tortuosity. No aneurysm. No adenopathy. Reproductive: No visible focal abnormality. Other: No free fluid or free air. Small bilateral inguinal hernias containing fat. Musculoskeletal: No acute bony abnormality or focal bone lesion. Degenerative changes in the lumbar spine. IMPRESSION: No evidence of metastatic disease in the abdomen or pelvis. 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 Aortoiliac atherosclerosis. Small bilateral inguinal hernias containing fat the Electronically Signed   By: Rolm Baptise M.D.   On: 11/13/2015 11:26   EGD 11/13/2015 Dr. Collene Mares  -A large, fungating, friable mass with bleeding was found in the mid third of the esophagus, 22 cm from the incisors and extended to 27 cm. The mass was nonobstructing and a circumferential, biopsy was taken. -Normal stomach -Normal proximal small bowel  Colonoscopy 11/13/2015 Dr. Collene Mares -Diverticulosis in the entire exam: -No specimens collected  ASSESSMENT & PLAN: 75 year old gentleman, without significant past medical history except acid reflux, presented with progressive dysphagia and odynophagia and weight loss.  1. Esophageal cancer, squamous cell carcinoma, cTxNxMx -I reviewed his EGD findings, biopsy results, and the CT scan findings with patient and his wife in great details -I recommend staging PET scan to ruled out metastatic disease. -If PET scan is negative for distant metastasis, I recommend EUS for tumor and node staging. We will send a message to Dr. Benson Norway for scheduling -Patient has been seen by a radiation oncologist Dr. Lisbeth Renshaw, neoadjuvant irradiation is recommended if he has no distant metastasis. -If he  does have locally advanced her disease, I recommend neoadjuvant chemotherapy with weekly carboplatin and Taxol with concurrent radiation -If no distant metastasis, I will refer him to Dr. Servando Snare for surgical evaluation. -If he does have distant metastasis, then his treatment plan would be palliative radiation followed by systemic chemotherapy. Given his squamous cell carcinoma histology, I would recommend carboplatin and Taxol as first-line treatment. --Chemotherapy consent: Side effects including but does not not limited to, fatigue, nausea, vomiting, diarrhea, hair loss, neuropathy, fluid retention,  renal and kidney dysfunction, neutropenic fever, needed for blood transfusion, bleeding, were discussed with patient in great detail. She agrees to proceed.  2. Dysphagia -He is able to tolerate a soft diet and inadequate well, I encouraged him to meet our dietitian Pamala Hurry, and strongly encouraged him to have nutritional supplement -We discussed he may need a  feeding tube if his dysphagia progresses during the treatment  3. Odynophagia -Dr. Lisbeth Renshaw has prescribed Percocet as needed for his pain, he will continue  -I will adjust his pain medication if needed during his treatment   Recommendations: Based on information available as of today's consult. Recommendations may change depending on the results of further tests or exams. 1)  PET scan to complete staging 2)  Radiation therapy M-F with weekly chemotherapy of Taxol and Carboplatin 3)   Endoscopic [place] per Dr. Benson Norway before RT starts 4)   High protein/High calorie diet Next Steps: 1) See dietician tomorrow at [place] as scheduled then labs afterwards 2) Radiology will call you with PET scan appointment 3)  Chemotherapy education class in next week 4) Dr. Ulyses Amor office will call you with the EUS appointment  No orders of the defined types were placed in this encounter.   All questions were answered. The patient knows to call the clinic with any  problems, questions or concerns. I spent 55 minutes counseling the patient face to face. The total time spent in the appointment was 60 minutes and more than 50% was on counseling.     [name], MD 11/26/2015 8:23 AM

## 2015-11-26 NOTE — Patient Instructions (Signed)
Care Plan Summary- 11/26/2015 Name:  Juan [name]       DOB: November 06, 1940 Your Medical Team:  Medical Oncologist:  Dr. [name] Radiation Oncologist:  Dr. Kyung Rudd  Surgeon:     Type of Cancer: Squamous Cell Carcinoma of Esophagus  Stage/Grade: Undetermined *Exact staging of your cancer is based on size of the tumor, depth of invasion, involvement of lymph nodes or not, and whether or not the cancer has spread beyond the primary site   Recommendations: Based on information available as of today's consult. Recommendations may change depending on the results of further tests or exams. 1)  PET scan to complete staging 2)  Radiation therapy M-F with weekly chemotherapy of Taxol and Carboplatin 3)   Endoscopic [place] per Dr. Benson Norway before RT starts 4)   High protein/High calorie diet Next Steps: 1) See dietician tomorrow at [place] as scheduled then labs afterwards 2) Radiology will call you with PET scan appointment 3)  Chemotherapy education class in next week 4) Dr. Ulyses Amor office will call you with the EUS appointment ______________________________________________________________________________Questions? Merceda Elks, RN, BSN at [phone]. Juan [name] is your Oncology Nurse Navigator and is available to assist you while you're receiving our medical care

## 2015-11-27 ENCOUNTER — Telehealth: Payer: Self-pay | Admitting: *Deleted

## 2015-11-27 ENCOUNTER — Other Ambulatory Visit (HOSPITAL_BASED_OUTPATIENT_CLINIC_OR_DEPARTMENT_OTHER): Payer: Managed Care, Other (non HMO)

## 2015-11-27 ENCOUNTER — Ambulatory Visit: Payer: Managed Care, Other (non HMO) | Admitting: Nutrition

## 2015-11-27 DIAGNOSIS — C154 Malignant neoplasm of middle third of esophagus: Secondary | ICD-10-CM

## 2015-11-27 LAB — COMPREHENSIVE METABOLIC PANEL
ALT: 10 U/L (ref 0–55)
AST: 12 U/L (ref 5–34)
Albumin: 3.4 g/dL — ABNORMAL LOW (ref 3.5–5.0)
Alkaline Phosphatase: 77 U/L (ref 40–150)
Anion Gap: 8 mEq/L (ref 3–11)
BUN: 8.4 mg/dL (ref 7.0–26.0)
CO2: 27 mEq/L (ref 22–29)
Calcium: 9.2 mg/dL (ref 8.4–10.4)
Chloride: 104 mEq/L (ref 98–109)
Creatinine: 0.8 mg/dL (ref 0.7–1.3)
EGFR: >90 mL/min/{1.73_m2} (ref >90)
Glucose: 89 mg/dl (ref 70–140)
Potassium: 4.2 mEq/L (ref 3.5–5.1)
Sodium: 139 mEq/L (ref 136–145)
Total Bilirubin: 0.43 mg/dL (ref 0.20–1.20)
Total Protein: 7 g/dL (ref 6.4–8.3)

## 2015-11-27 LAB — CBC WITH DIFFERENTIAL/PLATELET
BASO%: 0.5 % (ref 0.0–2.0)
Basophils Absolute: 0 10*3/uL (ref 0.0–0.1)
EOS%: 3 % (ref 0.0–7.0)
Eosinophils Absolute: 0.2 10*3/uL (ref 0.0–0.5)
HCT: 39.3 % (ref 38.4–49.9)
HGB: 12 g/dL — ABNORMAL LOW (ref 13.0–17.1)
LYMPH%: 31.3 % (ref 14.0–49.0)
MCH: 22.7 pg — ABNORMAL LOW (ref 27.2–33.4)
MCHC: 30.6 g/dL — ABNORMAL LOW (ref 32.0–36.0)
MCV: 74.4 fL — ABNORMAL LOW (ref 79.3–98.0)
MONO#: 0.5 10*3/uL (ref 0.1–0.9)
MONO%: 10.4 % (ref 0.0–14.0)
NEUT#: 2.8 10*3/uL (ref 1.5–6.5)
NEUT%: 54.8 % (ref 39.0–75.0)
Platelets: 261 10*3/uL (ref 140–400)
RBC: 5.29 10*6/uL (ref 4.20–5.82)
RDW: 14.8 % — ABNORMAL HIGH (ref 11.0–14.6)
WBC: 5.1 10*3/uL (ref 4.0–10.3)
lymph#: 1.6 10*3/uL (ref 0.9–3.3)

## 2015-11-27 LAB — FERRITIN: Ferritin: 132 ng/ml (ref 22–316)

## 2015-11-27 LAB — IRON AND TIBC
%SAT: 36 % (ref 20–55)
Iron: 68 ug/dL (ref 42–163)
TIBC: 189 ug/dL — ABNORMAL LOW (ref 202–409)
UIBC: 120 ug/dL (ref 117–376)

## 2015-11-27 NOTE — Progress Notes (Signed)
75 year old male diagnosed with esophageal cancer.  Past medical history includes reflux.  Medications include Lipitor, Glucophage, and Prilosec.  Labs include glucose of 112 on March 24.  Height: 69 inches. Weight: 175 pounds August 25. Usual body weight: 195 pounds per patient. BMI: 25.84.  Patient reports he is able to swallow liquids without difficulty.  He can chew soft solids but has difficulty with meats.   Patient denies other nutrition impact symptom but does endorse weight loss. Reports he will receive chemotherapy and radiation treatment. Reports he has been educated about potential need for feeding tube placement.  Nutrition diagnosis: Unintended weight loss related to esophageal cancer as evidenced by weight, weight loss from usual body weight.  Intervention: Patient was educated to continue soft moist foods that are high in protein and calories to minimize further weight loss. Educated patient on texture modification. Encouraged patient to drink oral nutrition supplements and provided samples and coupons. Provided multiple fact sheets on increasing calories and protein, soft protein foods, and swallowing difficulties. Questions were answered.  Teach back method used.  Contact information provided.  Monitoring, evaluation, goals: Patient will tolerate increased calories and protein to minimize further weight loss.  Next visit: To be scheduled.  **Disclaimer: This note was dictated with voice recognition software. Similar sounding words can inadvertently be transcribed and this note may contain transcription errors which may not have been corrected upon publication of note.**

## 2015-11-27 NOTE — Telephone Encounter (Signed)
Oncology Nurse Navigator Documentation  Oncology Nurse Navigator Flowsheets 11/27/2015  Navigator Location [facility]  Navigator Encounter Type Telephone  Telephone Outgoing Call;Appt Confirmation/Clarification  Abnormal Finding Date -  Confirmed Diagnosis Date -  Patient Visit Type -  Treatment Phase Pre-Tx/Tx Discussion  Barriers/Navigation Needs Coordination of Care--needs PET early week 9/4 and possible EUS  Education -  Interventions Coordination of Care--inbasket to Dr. Benson Norway to request EUS 9/7 if PET negative at request of Dr. Burr Medico  Coordination of Care Radiology--PET at [facility] 12/03/15 at [place] notified of appointment and prep. Written directions will be given to him on 8/31 in education class along with directions to [place] with Patient 30  MD called Juan [name] and obtain authorization [id number] and navigator called back to get facility changed to [facility] (spoke with Darius R. At [facility]). Notified rad onc of appointment.

## 2015-11-28 ENCOUNTER — Other Ambulatory Visit: Payer: Managed Care, Other (non HMO)

## 2015-11-28 ENCOUNTER — Encounter: Payer: Self-pay | Admitting: *Deleted

## 2015-11-28 ENCOUNTER — Telehealth: Payer: Self-pay | Admitting: *Deleted

## 2015-11-28 NOTE — Telephone Encounter (Signed)
Per staff message from GI navigator I have scheduled appts. Sent her a message back and appts are in.

## 2015-11-29 ENCOUNTER — Other Ambulatory Visit: Payer: Self-pay | Admitting: Gastroenterology

## 2015-11-29 ENCOUNTER — Telehealth: Payer: Self-pay | Admitting: *Deleted

## 2015-11-29 ENCOUNTER — Encounter: Payer: Self-pay | Admitting: *Deleted

## 2015-11-29 NOTE — Progress Notes (Signed)
Juan [name]  Clinical Social [name] was referred by patient's wife for assessment of psychosocial needs due to transportation needs.  Clinical Social Worker contacted patient's wife to offer support and assess for needs. Per wife, pt is main driver at home as she does not drive. She is worried how he can get to appointments when family cannot assist. They report to have a ride for 12/04/15. CSW educated wife about SCAT and ACS. CSW provided ACS number to wife and encouraged her to call to arrange rides in advance.   CSW team to meet with pt and wife at chemo and complete SCAT application on [id number].    Clinical Social [name] interventions: Resource education and referral  Juan [name], [place] Worker [place]  [place] Phone: [phone] Fax: [phone]

## 2015-11-29 NOTE — Telephone Encounter (Signed)
Called the wife and gave correct time to be here at radiation oncology dept starting 12/04/15 after infusion on linac#4, disregard the 315pm  Time, Epic is wrong, will have his start treatment at 1:20pm, wife thanked this Rn For the correct schedule, we will give her a correct schedule when they come Wednesday 9:19 AM

## 2015-11-29 NOTE — Telephone Encounter (Signed)
Patient's wife called and left message. She stated that she was returning a call. I sent message to Thayer Headings in radiation the message

## 2015-12-01 ENCOUNTER — Telehealth: Payer: Self-pay | Admitting: Hematology

## 2015-12-01 NOTE — Telephone Encounter (Signed)
Spoke with patient/wife re next appointment for 9/6. Patient to get new schedule at next visit.

## 2015-12-02 ENCOUNTER — Encounter: Payer: Self-pay | Admitting: Hematology

## 2015-12-03 ENCOUNTER — Ambulatory Visit: Payer: Managed Care, Other (non HMO)

## 2015-12-03 ENCOUNTER — Encounter
Admission: RE | Admit: 2015-12-03 | Discharge: 2015-12-03 | Disposition: A | Discharge status: Home / Self Care | Payer: Managed Care, Other (non HMO) | Source: Ambulatory Visit | Attending: Radiation Oncology | Admitting: Radiation Oncology

## 2015-12-03 DIAGNOSIS — C154 Malignant neoplasm of middle third of esophagus: Secondary | ICD-10-CM | POA: Insufficient documentation

## 2015-12-03 DIAGNOSIS — C153 Malignant neoplasm of upper third of esophagus: Secondary | ICD-10-CM | POA: Diagnosis not present

## 2015-12-03 LAB — GLUCOSE, CAPILLARY: Glucose-Capillary: 111 mg/dL — ABNORMAL HIGH (ref 65–99)

## 2015-12-03 MED ORDER — FLUDEOXYGLUCOSE F - 18 (FDG) INJECTION
12.6600 | Freq: Once | INTRAVENOUS | Status: AC
  Administered 2015-12-03: 12.66 via INTRAVENOUS

## 2015-12-04 ENCOUNTER — Other Ambulatory Visit: Payer: Self-pay

## 2015-12-04 ENCOUNTER — Other Ambulatory Visit (HOSPITAL_BASED_OUTPATIENT_CLINIC_OR_DEPARTMENT_OTHER): Payer: Managed Care, Other (non HMO)

## 2015-12-04 ENCOUNTER — Ambulatory Visit: Payer: Managed Care, Other (non HMO) | Admitting: Radiation Oncology

## 2015-12-04 ENCOUNTER — Telehealth: Payer: Self-pay | Admitting: Hematology

## 2015-12-04 ENCOUNTER — Encounter: Payer: Self-pay | Admitting: Hematology

## 2015-12-04 ENCOUNTER — Encounter (HOSPITAL_COMMUNITY): Payer: Self-pay

## 2015-12-04 ENCOUNTER — Emergency Department (HOSPITAL_COMMUNITY)
Admission: EM | Admit: 2015-12-04 | Discharge: 2015-12-05 | Disposition: A | Discharge status: Home / Self Care | Payer: Managed Care, Other (non HMO) | Source: Home / Self Care | Attending: Emergency Medicine | Admitting: Emergency Medicine

## 2015-12-04 ENCOUNTER — Ambulatory Visit: Payer: Managed Care, Other (non HMO)

## 2015-12-04 ENCOUNTER — Encounter: Payer: Self-pay | Admitting: Radiation Oncology

## 2015-12-04 ENCOUNTER — Ambulatory Visit
Admission: RE | Admit: 2015-12-04 | Payer: Managed Care, Other (non HMO) | Source: Ambulatory Visit | Admitting: Radiation Oncology

## 2015-12-04 ENCOUNTER — Emergency Department (HOSPITAL_COMMUNITY): Payer: Managed Care, Other (non HMO)

## 2015-12-04 ENCOUNTER — Ambulatory Visit (HOSPITAL_BASED_OUTPATIENT_CLINIC_OR_DEPARTMENT_OTHER): Payer: Managed Care, Other (non HMO) | Admitting: Hematology

## 2015-12-04 VITALS — BP 124/85 | HR 65 | Temp 98.0°F | Resp 18 | Ht 69.0 in | Wt 173.7 lb

## 2015-12-04 DIAGNOSIS — C154 Malignant neoplasm of middle third of esophagus: Secondary | ICD-10-CM

## 2015-12-04 DIAGNOSIS — R1310 Dysphagia, unspecified: Secondary | ICD-10-CM

## 2015-12-04 DIAGNOSIS — R079 Chest pain, unspecified: Secondary | ICD-10-CM

## 2015-12-04 DIAGNOSIS — C159 Malignant neoplasm of esophagus, unspecified: Secondary | ICD-10-CM | POA: Diagnosis not present

## 2015-12-04 DIAGNOSIS — R6889 Other general symptoms and signs: Secondary | ICD-10-CM | POA: Diagnosis not present

## 2015-12-04 DIAGNOSIS — R072 Precordial pain: Secondary | ICD-10-CM | POA: Diagnosis not present

## 2015-12-04 DIAGNOSIS — Z7982 Long term (current) use of aspirin: Secondary | ICD-10-CM

## 2015-12-04 DIAGNOSIS — Z7984 Long term (current) use of oral hypoglycemic drugs: Secondary | ICD-10-CM

## 2015-12-04 DIAGNOSIS — Z87891 Personal history of nicotine dependence: Secondary | ICD-10-CM | POA: Insufficient documentation

## 2015-12-04 DIAGNOSIS — Z79899 Other long term (current) drug therapy: Secondary | ICD-10-CM

## 2015-12-04 DIAGNOSIS — C153 Malignant neoplasm of upper third of esophagus: Secondary | ICD-10-CM | POA: Diagnosis not present

## 2015-12-04 LAB — CBC WITH DIFFERENTIAL/PLATELET
BASO%: 0.3 % (ref 0.0–2.0)
Basophils Absolute: 0 10e3/uL (ref 0.0–0.1)
EOS%: 3.1 % (ref 0.0–7.0)
Eosinophils Absolute: 0.2 10e3/uL (ref 0.0–0.5)
HCT: 39 % (ref 38.4–49.9)
HGB: 12.5 g/dL — ABNORMAL LOW (ref 13.0–17.1)
LYMPH%: 28.9 % (ref 14.0–49.0)
MCH: 23.3 pg — ABNORMAL LOW (ref 27.2–33.4)
MCHC: 32.1 g/dL (ref 32.0–36.0)
MCV: 72.8 fL — ABNORMAL LOW (ref 79.3–98.0)
MONO#: 0.6 10e3/uL (ref 0.1–0.9)
MONO%: 10.5 % (ref 0.0–14.0)
NEUT#: 3.3 10e3/uL (ref 1.5–6.5)
NEUT%: 57.2 % (ref 39.0–75.0)
Platelets: 334 10e3/uL (ref 140–400)
RBC: 5.36 10e6/uL (ref 4.20–5.82)
RDW: 14.7 % — ABNORMAL HIGH (ref 11.0–14.6)
WBC: 5.8 10e3/uL (ref 4.0–10.3)
lymph#: 1.7 10e3/uL (ref 0.9–3.3)

## 2015-12-04 LAB — COMPREHENSIVE METABOLIC PANEL
ALT: 10 U/L (ref 0–55)
AST: 15 U/L (ref 5–34)
Albumin: 3.7 g/dL (ref 3.5–5.0)
Alkaline Phosphatase: 91 U/L (ref 40–150)
Anion Gap: 13 mEq/L — ABNORMAL HIGH (ref 3–11)
BUN: 13.2 mg/dL (ref 7.0–26.0)
CO2: 23 mEq/L (ref 22–29)
Calcium: 9.5 mg/dL (ref 8.4–10.4)
Chloride: 103 mEq/L (ref 98–109)
Creatinine: 0.9 mg/dL (ref 0.7–1.3)
EGFR: >90 mL/min/{1.73_m2} (ref >90)
Glucose: 126 mg/dl (ref 70–140)
Potassium: 4.3 mEq/L (ref 3.5–5.1)
Sodium: 139 mEq/L (ref 136–145)
Total Bilirubin: 0.32 mg/dL (ref 0.20–1.20)
Total Protein: 7.6 g/dL (ref 6.4–8.3)

## 2015-12-04 LAB — I-STAT CHEM 8, ED
BUN: 16 mg/dL (ref 6–20)
Calcium, Ion: 1.03 mmol/L — ABNORMAL LOW (ref 1.15–1.40)
Chloride: 103 mmol/L (ref 101–111)
Creatinine, Ser: 0.7 mg/dL (ref 0.61–1.24)
Glucose, Bld: 104 mg/dL — ABNORMAL HIGH (ref 65–99)
HCT: 43 % (ref 39.0–52.0)
Hemoglobin: 14.6 g/dL (ref 13.0–17.0)
Potassium: 4.6 mmol/L (ref 3.5–5.1)
Sodium: 137 mmol/L (ref 135–145)
TCO2: 27 mmol/L (ref 0–100)

## 2015-12-04 LAB — I-STAT TROPONIN, ED: Troponin i, poc: 0.01 ng/mL (ref 0.00–0.08)

## 2015-12-04 NOTE — Progress Notes (Signed)
[place]  Telephone:(336) [phone] Fax:(336) [phone]  Clinic follow Up Note   Patient Care Team: Rama ([place]ri[place] Seashore) [name], MD (Inactive) as PCP - General (Internal Medicine) 12/04/2015  Referring physician: Dr. Collene Mares  CHIEF COMPLAINTS:  Follow up esophageal squamous cell carcinoma  Oncology History   Presented with dysphagia and odynophagia with 20-25 pounds of weight loss in about 3-4 months     Esophageal cancer ([place])   11/13/2015 Procedure    UPPER ENDOSCOPY per Dr. Collene Mares: Large fungating, friable bleeding mass in middle third of esophagus, 22cm from incisors and extended to 27cm. Non-obstructing.      11/13/2015 Pathology Results    Invasive squamous cell carcinoma; moderately differentiated      11/13/2015 Imaging    CT ABD/PELVIS: IMPRESSION:No evidence of metastatic disease in the abdomen or pelvis. 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 Aortoiliac atherosclerosis. Small bilateral inguinal hernias containing fat the      11/22/2015 Initial Diagnosis    Esophageal cancer ([place])     12/03/2015 Imaging    PET scan showed a long segment of hypermetabolic thickening in the mid esophagus consistent with esophageal carcinoma. No clear evidence of pedis tunnel node metastasis or distant metastasis.       HISTORY OF PRESENTING ILLNESS:  Juan [name] 75 y.o. male is here because of His recently diagnosed esophageal cancer. He is accompanied by his wife to my clinic today.  He started having sore throat and dysphagia about 4-5 months ago, and was seen by PCP, had 2 course antibiotics but is symptom progressed. He was referred to gastroenterologist Dr. Suezanne Cheshire, and underwent EGD on 11/13/2015, which showed a large fungating, friable bleeding mass in the middle third of esophagus, 22 cm to 27 cm from incisors, non-obstructing. The biopsy showed squamous cell carcinoma. His CT abdomen and pelvis was negative for metastatic disease.  He has lost 20lbs, he is on soft  diet and liquids, his pain is about 7/10 pain with swallowing. He also has chest pain , intermittent, it lasts about a few minutes, no nausea or vomiting, his bowel movement is normal. He denies melena or hematochezia. He is able to function and tolerated light activities at home.  CURRENT THERAPY: Pending concurrent chemotherapy and radiation, starting on 12/09/2015  INTERIM HISTORY: Juan [name] returns for follow-up. He is doing moderately well, complains of left-sided chest pain, moderate and the persistent, he has been taking Percocet 1-2 tablets a day. He eats soft diet and drinks liquids without any difficulties, he also drinks a short period he has good appetite. He lost about 2 pounds in the past week. No other new complaints.  MEDICAL HISTORY:  Past Medical History:  Diagnosis Date  . Esophageal cancer ([place])   . Reflux     SURGICAL HISTORY: Past Surgical History:  Procedure Laterality Date  . HERNIA REPAIR    . SHOULDER ARTHROSCOPY W/ SUPERIOR LABRAL ANTERIOR POSTERIOR LESION REPAIR      SOCIAL HISTORY: Social History   Social History  . Marital status: Married    Spouse name: N/A  . Number of children: N/A  . Years of education: N/A   Occupational History  . Not on file.   Social History Main Topics  . Smoking status: Former Smoker    Packs/day: 2.00    Years: 40.00    Quit date: 03/31/1999  . Smokeless tobacco: Never Used  . Alcohol use No     Comment: weekend binge drinking for 30-40 years, quit in 2001  .  Drug use: No  . Sexual activity: Not on file   Other Topics Concern  . 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 Colon cancer Father 52  . Colon cancer Brother 80    ALLERGIES:  has No Known  Allergies.  MEDICATIONS:  Current Outpatient Prescriptions  Medication Sig Dispense Refill  . acetaminophen (TYLENOL) 500 MG tablet Take 500 mg by mouth every 6 (six) hours as needed.    Marland Kitchen albuterol (PROVENTIL HFA;VENTOLIN HFA) 108 (90 BASE) MCG/ACT inhaler Inhale 2 puffs into the lungs every 4 (four) hours as needed for wheezing. 1 Inhaler 0  . amitriptyline (ELAVIL) 10 MG tablet Take 10 mg by mouth at bedtime. 11/16/15 - Take 1 tablet at Bedtime x 1 Week, then increase to 2 tablets Once Daily Orally- 30 days    . aspirin EC 81 MG tablet Take 81 mg by mouth daily.    Marland Kitchen atorvastatin (LIPITOR) 20 MG tablet Take 20 mg by mouth at bedtime.     . metFORMIN (GLUCOPHAGE) 500 MG tablet Take 500 mg by mouth daily with breakfast.    . omeprazole (PRILOSEC) 40 MG capsule Take 40 mg by mouth daily.     Marland Kitchen oxyCODONE-acetaminophen (PERCOCET/ROXICET) 5-325 MG tablet Take 1-2 tablets by mouth every 6 (six) hours as needed for severe pain. 40 tablet 0   No current facility-administered medications for this visit.     REVIEW OF SYSTEMS:   Constitutional: Denies fevers, chills or abnormal night sweats Eyes: Denies blurriness of vision, double vision or watery eyes Ears, nose, mouth, throat, and face: Denies mucositis or sore throat Respiratory: Denies cough, dyspnea or wheezes Cardiovascular: Denies palpitation, chest discomfort or lower extremity swelling Gastrointestinal:  Denies nausea, heartburn or change in bowel habits Skin: Denies abnormal skin rashes Lymphatics: Denies new lymphadenopathy or easy bruising Neurological:Denies numbness, tingling or new weaknesses Behavioral/Psych: Mood is stable, no new changes  All other systems were reviewed with the patient and are negative.  PHYSICAL EXAMINATION: ECOG PERFORMANCE STATUS: 1 - Symptomatic but completely ambulatory  Vitals:   12/04/15 0846  BP: 124/85  Pulse: 65  Resp: 18  Temp: 98 F (36.7 C)   Filed Weights   12/04/15 0846  Weight:  173 lb 11.2 oz (78.8 kg)    GENERAL:alert, no distress and comfortable SKIN: skin color, texture, turgor are normal, no rashes or significant lesions EYES: normal, conjunctiva are pink and non-injected, sclera clear OROPHARYNX:no exudate, no erythema and lips, buccal mucosa, and tongue normal  NECK: supple, thyroid normal size, non-tender, without nodularity LYMPH:  no palpable lymphadenopathy in the cervical, axillary or inguinal LUNGS: clear to auscultation and percussion with normal breathing effort HEART: regular rate & rhythm and no murmurs and no lower extremity edema ABDOMEN:abdomen soft, non-tender and normal bowel sounds Musculoskeletal:no cyanosis of digits and no clubbing  PSYCH: alert & oriented x 3 with fluent speech NEURO: no focal motor/sensory deficits  LABORATORY DATA:  I have reviewed the data as listed CBC Latest Ref Rng & Units 12/04/2015 11/27/2015 06/21/2014  WBC 4.0 - 10.3 10e3/uL 5.8 5.1 8.6  Hemoglobin 13.0 - 17.1 g/dL 12.5(L) 12.0(L) 12.9(L)  Hematocrit 38.4 - 49.9 % 39.0 39.3 41.1  Platelets 140 -  400 10e3/uL 334 261 277   CMP Latest Ref Rng & Units 12/04/2015 11/27/2015 06/21/2014  Glucose 70 - 140 mg/dl 126 89 112(H)  BUN 7.0 - 26.0 mg/dL 13.2 8.4 9  Creatinine 0.7 - 1.3 mg/dL 0.9 0.8 0.98  Sodium 136 - 145 mEq/L 139 139 139  Potassium 3.5 - 5.1 mEq/L 4.3 4.2 4.2  Chloride 96 - 112 mmol/L - - 105  CO2 22 - 29 mEq/L 23 27 28   Calcium 8.4 - 10.4 mg/dL 9.5 9.2 8.9  Total Protein 6.4 - 8.3 g/dL 7.6 7.0 7.0  Total Bilirubin 0.20 - 1.20 mg/dL 0.32 0.43 0.7  Alkaline Phos 40 - 150 U/L 91 77 70  AST 5 - 34 U/L 15 12 22   ALT 0 - 55 U/L 10 10 19    PATHOLOGY REPORT  Final microscopic diagnosis Esophagus, 0.2 cm to 27 cm, biopsy -Invasive squamous cell carcinoma, moderately differentiated   RADIOGRAPHIC STUDIES: I have personally reviewed the radiological images as listed and agreed with the findings in the report. Ct Abdomen Pelvis W Contrast  Result Date:  11/13/2015 CLINICAL DATA:  Esophageal mass found on endoscopy. Evaluate for metastasis. EXAM: CT ABDOMEN AND PELVIS WITH CONTRAST TECHNIQUE: Multidetector CT imaging of the abdomen and pelvis was performed using the standard protocol following bolus administration of intravenous contrast. CONTRAST:  196mL ISOVUE-300 IOPAMIDOL (ISOVUE-300) INJECTION 61% COMPARISON:  06/21/2014 FINDINGS: Lower chest: Lung bases are clear. No effusions. Heart is normal size. Hepatobiliary: No focal hepatic abnormality. Gallbladder unremarkable. Pancreas: No focal abnormality or ductal dilatation. Spleen: Calcifications within the spleen compatible with old granulomatous disease. Normal size. Adrenals/Urinary Tract: Adrenal glands normal. Parapelvic cysts within the left kidney. No hydronephrosis urinary bladder decompressed, grossly unremarkable. Stomach/Bowel: Stomach, large and small bowel grossly unremarkable. Vascular/Lymphatic: Scattered aortic and iliac calcifications with tortuosity. No aneurysm. No adenopathy. Reproductive: No visible focal abnormality. Other: No free fluid or free air. Small bilateral inguinal hernias containing fat. Musculoskeletal: No acute bony abnormality or focal bone lesion. Degenerative changes in the lumbar spine. IMPRESSION: No evidence of metastatic disease in the abdomen or pelvis. 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 Aortoiliac atherosclerosis. Small bilateral inguinal hernias containing fat the Electronically Signed   By: Rolm Baptise M.D.   On: 11/13/2015 11:26   Nm Pet Image Initial (pi) Skull Base To Thigh  Result Date: 12/03/2015 CLINICAL DATA:  Initial treatment strategy for esophageal cancer of the middle third of the esophagus. EXAM: NUCLEAR MEDICINE PET SKULL BASE TO THIGH TECHNIQUE: 12.7 mCi F-18 FDG was injected intravenously. Full-ring PET imaging was performed from the skull base to thigh after the radiotracer. CT data was obtained and used for attenuation correction and anatomic  localization. FASTING BLOOD GLUCOSE:  Value: 11 mg/dl COMPARISON:  CT abdomen 11/13/2015 FINDINGS: NECK No hypermetabolic lymph nodes in the neck. CHEST A 3 cm segment of circumferential esophageal wall thickening at the level of the carina with intense metabolic activity (SUV max equal 18.2). Single wall thickness measures 1.5 cm High RIGHT paratracheal lymph node measures 7 mm (image 964, series 4) with low metabolic activity the hypermetabolic activity (SUV max equaled 2.1). No hypermetabolic paraesophageal lymph nodes. No suspicious pulmonary nodules. ABDOMEN/PELVIS No hypermetabolic gastrohepatic ligament nodes. No abnormal metabolic activity in the liver. No hypermetabolic retroperitoneal or pelvic lymph nodes. Physiologic activity noted within the colon. SKELETON No focal hypermetabolic activity to suggest skeletal metastasis. IMPRESSION: 1. Long segment of hypermetabolic thickening in the mid esophagus consistent with esophageal carcinoma. 2. No clear evidence of  mediastinal nodal metastasis. Single high RIGHT paratracheal lymph node with low metabolic activity 3. No evidence of liver metastasis or upper abdominal nodal metastasis Electronically Signed   By: Suzy Bouchard M.D.   On: 12/03/2015 10:53   EGD 11/13/2015 Dr. Collene Mares  -A large, fungating, friable mass with bleeding was found in the mid third of the esophagus, 22 cm from the incisors and extended to 27 cm. The mass was nonobstructing and a circumferential, biopsy was taken. -Normal stomach -Normal proximal small bowel  Colonoscopy 11/13/2015 Dr. Collene Mares -Diverticulosis in the entire exam: -No specimens collected  ASSESSMENT & PLAN: 75 year old gentleman, without significant past medical history except acid reflux, presented with progressive dysphagia and odynophagia and weight loss.  1. Esophageal cancer, squamous cell carcinoma, cTxNxMx -I reviewed his EGD findings, biopsy results, and the CT scan findings with patient and his wife in  great details -I dicussed staging PET scan findings with patient and his wife, the mid esophageal mass is hypermetabolic, no distant metastasis. -He is scheduled to have upper EUS for tumor and node staging by Dr. Benson Norway tomorrow -If he does have locally advanced disease (T2 or above, or positive node), I recommend neoadjuvant chemotherapy with weekly carboplatin and Taxol with concurrent radiation -we will refer him to Dr. Servando Snare for surgical evaluation. --Chemotherapy consent: Side effects including but does not not limited to, fatigue, nausea, vomiting, diarrhea, hair loss, neuropathy, fluid retention, renal and kidney dysfunction, neutropenic fever, needed for blood transfusion, bleeding, were discussed with patient in great detail. She agrees to proceed.  2. Dysphagia -He is able to tolerate a soft diet and drinks fluids adequately -follow up with dietician Pamala Hurry   3. Odynophagia -Dr. Lisbeth Renshaw has prescribed Percocet as needed for his pain, he will continue  -I will adjust his pain medication if needed during his treatment   Recommendations -upper EUS tomorrow by Dr. Benson Norway -starting concurrent chemo with weekly carbo and taxol and radiation on 9/11 -ab weekly, I will see him every Thursday   All questions were answered. The patient knows to call the clinic with any problems, questions or concerns. I spent 20 minutes counseling the patient face to face. The total time spent in the appointment was 25 minutes and more than 50% was on counseling.     [name], MD 12/04/2015 9:25 AM

## 2015-12-04 NOTE — ED Triage Notes (Signed)
Pt complains of left sided chest pain since yesterday and he states that it's a sharp pain, he also complains of indigestion type symptoms

## 2015-12-04 NOTE — ED Notes (Signed)
Pt has a procedure schedule tomorrow for his recently diagnosis of esophageal cancer with Dr Benson Norway to find out what stage he is in.

## 2015-12-04 NOTE — ED Provider Notes (Addendum)
[facility]-EMERGENCY DEPT Provider Note   CSN: [id number] Arrival date & time: 12/04/15  2125     History   Chief Complaint Chief Complaint  Patient presents with  . Chest Pain    HPI Juan [name] is a 75 y.o. male.  HPI Patient presents with left-sided chest pain. States he's had for last week but worse today. Has a history of esophageal cancer. Scheduled to have a procedure by GI tomorrow for more in-depth staging. Had a PET scan yesterday that showed one possible node but otherwise no distant spread. Patient is somewhat difficult to get a history out of. The pain is on the left side of his chest.  Not Worse with eating. No real shortness of breath. States he's had this pain for maybe the last week but is not sure if it is the pain for his tumor. No swelling in his legs.   Past Medical History:  Diagnosis Date  . Esophageal cancer ([facility])   . Reflux     Patient Active Problem List   Diagnosis Date Noted  . Esophageal cancer ([facility]) 11/22/2015    Past Surgical History:  Procedure Laterality Date  . HERNIA REPAIR    . SHOULDER ARTHROSCOPY W/ SUPERIOR LABRAL ANTERIOR POSTERIOR LESION REPAIR         Home Medications    Prior to Admission medications   Medication Sig Start Date End Date Taking? Authorizing Provider  acetaminophen (TYLENOL) 500 MG tablet Take 500 mg by mouth every 6 (six) hours as needed.    Historical Provider, MD  albuterol (PROVENTIL HFA;VENTOLIN HFA) 108 (90 BASE) MCG/ACT inhaler Inhale 2 puffs into the lungs every 4 (four) hours as needed for wheezing. 03/23/12   Devoria Albe, MD  amitriptyline (ELAVIL) 10 MG tablet Take 10 mg by mouth at bedtime. 11/16/15 - Take 1 tablet at Bedtime x 1 Week, then increase to 2 tablets Once Daily Orally- 30 days 11/16/15 11/15/16  Historical Provider, MD  aspirin EC 81 MG tablet Take 81 mg by mouth daily.    Historical Provider, MD  atorvastatin (LIPITOR) 20 MG tablet Take 20 mg by mouth at bedtime.  11/30/13   Historical Provider,  MD  metFORMIN (GLUCOPHAGE) 500 MG tablet Take 500 mg by mouth daily with breakfast.    Historical Provider, MD  omeprazole (PRILOSEC) 40 MG capsule Take 40 mg by mouth daily.  12/18/13   Historical Provider, MD  oxyCODONE-acetaminophen (PERCOCET/ROXICET) 5-325 MG tablet Take 1-2 tablets by mouth every 6 (six) hours as needed for severe pain. 11/25/15   Dorothy Puffer, MD    Family History Family History  Problem Relation Age of Onset  . Colon cancer Father 46  . Colon cancer Brother 21    Social History Social History  Substance Use Topics  . Smoking status: Former Smoker    Packs/day: 2.00    Years: 40.00    Quit date: 03/31/1999  . Smokeless tobacco: Never Used  . Alcohol use No     Comment: weekend binge drinking for 30-40 years, quit in 2001     Allergies   Review of patient's allergies indicates no known allergies.   Review of Systems Review of Systems  Constitutional: Negative for appetite change.  HENT: Negative for facial swelling.   Respiratory: Negative for shortness of breath.   Cardiovascular: Positive for chest pain.  Gastrointestinal: Negative for abdominal pain.  Endocrine: Negative for polydipsia.  Genitourinary: Negative for difficulty urinating.  Musculoskeletal: Negative for back pain.  Neurological: Negative for light-headedness.  Hematological: Negative for adenopathy.  Psychiatric/Behavioral: Negative for behavioral problems.     Physical Exam Updated Vital Signs BP 107/65 (BP Location: Right Arm)   Pulse 61   Temp 98.2 F (36.8 C) (Oral)   Resp 18   SpO2 100%   Physical Exam  Constitutional: He appears well-developed.  HENT:  Head: Atraumatic.  Eyes: EOM are normal.  Neck: Neck supple.  Cardiovascular: Normal rate.   Pulmonary/Chest: Effort normal.  Abdominal: Soft.  Musculoskeletal: He exhibits no edema.  Neurological: He is alert.  Skin: Skin is warm. Capillary refill takes less than 2 seconds.  Psychiatric: He has a normal mood and  affect.     ED Treatments / Results  Labs (all labs ordered are listed, but only abnormal results are displayed) Labs Reviewed  CBC WITH DIFFERENTIAL/PLATELET - Abnormal; Notable for the following:       Result Value   Hemoglobin 12.4 (*)    HCT 38.1 (*)    MCV 71.9 (*)    MCH 23.4 (*)    All other components within normal limits  I-STAT CHEM 8, ED - Abnormal; Notable for the following:    Glucose, Bld 104 (*)    Calcium, Ion 1.03 (*)    All other components within normal limits  D-DIMER, QUANTITATIVE (NOT AT [facility])  Randolm Idol, ED    EKG  EKG Interpretation  Date/Time:  Wednesday December 04 2015 23:51:46 EDT Ventricular Rate:  55 PR Interval:    QRS Duration: 153 QT Interval:  490 QTC Calculation: 469 R Axis:   -14 Text Interpretation:  Sinus rhythm Left bundle branch block Confirmed by Alvino Chapel  MD, Ryson Bacha [phone]) on 12/04/2015 11:59:21 PM       Radiology Dg Chest 2 View  Result Date: 12/04/2015 CLINICAL DATA:  75 year old male with intermittent midsternal chest pain. Recent diagnosis of esophageal cancer. EXAM: CHEST  2 VIEW COMPARISON:  PETCT dated 12/03/2015 FINDINGS: The lungs are clear. There is no pleural effusion or pneumothorax. The cardiac silhouette is within normal limits. No acute osseous pathology. IMPRESSION: No active cardiopulmonary disease. Electronically Signed   By: Anner Crete M.D.   On: 12/04/2015 23:51   Nm Pet Image Initial (pi) Skull Base To Thigh  Result Date: 12/03/2015 CLINICAL DATA:  Initial treatment strategy for esophageal cancer of the middle third of the esophagus. EXAM: NUCLEAR MEDICINE PET SKULL BASE TO THIGH TECHNIQUE: 12.7 mCi F-18 FDG was injected intravenously. Full-ring PET imaging was performed from the skull base to thigh after the radiotracer. CT data was obtained and used for attenuation correction and anatomic localization. FASTING BLOOD GLUCOSE:  Value: 11 mg/dl COMPARISON:  CT abdomen 11/13/2015 FINDINGS: NECK No  hypermetabolic lymph nodes in the neck. CHEST A 3 cm segment of circumferential esophageal wall thickening at the level of the carina with intense metabolic activity (SUV max equal 18.2). Single wall thickness measures 1.5 cm High RIGHT paratracheal lymph node measures 7 mm (image 964, series 4) with low metabolic activity the hypermetabolic activity (SUV max equaled 2.1). No hypermetabolic paraesophageal lymph nodes. No suspicious pulmonary nodules. ABDOMEN/PELVIS No hypermetabolic gastrohepatic ligament nodes. No abnormal metabolic activity in the liver. No hypermetabolic retroperitoneal or pelvic lymph nodes. Physiologic activity noted within the colon. SKELETON No focal hypermetabolic activity to suggest skeletal metastasis. IMPRESSION: 1. Long segment of hypermetabolic thickening in the mid esophagus consistent with esophageal carcinoma. 2. No clear evidence of mediastinal nodal metastasis. Single high RIGHT paratracheal lymph node with low metabolic activity 3. No evidence  of liver metastasis or upper abdominal nodal metastasis Electronically Signed   By: Suzy Bouchard M.D.   On: 12/03/2015 10:53    Procedures Procedures (including critical care time)  Medications Ordered in ED Medications - No data to display   Initial Impression / Assessment and Plan / ED Course  I have reviewed the triage vital signs and the nursing notes.  Pertinent labs & imaging results that were available during my care of the patient were reviewed by me and considered in my medical decision making (see chart for details).  Clinical Course    Patient was somewhat difficult history but left-sided chest pain. Not associated with eating. Could be pain from his cancer, however with active malignancy have to worry about pulmonary embolism. Will get d-dimer and if positive will get CTA. Care turned over to Dr. Randal Buba.  Final Clinical Impressions(s) / ED Diagnoses   Final diagnoses:  Chest pain, unspecified chest  pain type    New Prescriptions New Prescriptions   No medications on file     Davonna Belling, MD 12/05/15 0011  D-dimer was negative. Feels better after treatment. Will have patient adjust pain medicines with his oncologist. Will discharge.   Davonna Belling, MD 12/05/15 [phone]

## 2015-12-04 NOTE — Telephone Encounter (Signed)
AVS REPORT AND APPT SCHD GIVEN PER 12/04/15 LOS.

## 2015-12-04 NOTE — Progress Notes (Signed)
Paperwork (prudential) received 9/5, given to nurse 9/6

## 2015-12-05 ENCOUNTER — Encounter (HOSPITAL_COMMUNITY)
Admission: RE | Disposition: A | Discharge status: Home / Self Care | Payer: Self-pay | Source: Ambulatory Visit | Attending: Gastroenterology

## 2015-12-05 ENCOUNTER — Ambulatory Visit (HOSPITAL_COMMUNITY)
Admission: RE | Admit: 2015-12-05 | Discharge: 2015-12-05 | Disposition: A | Discharge status: Home / Self Care | Payer: Managed Care, Other (non HMO) | Source: Ambulatory Visit | Attending: Gastroenterology | Admitting: Gastroenterology

## 2015-12-05 ENCOUNTER — Ambulatory Visit: Payer: Managed Care, Other (non HMO)

## 2015-12-05 ENCOUNTER — Encounter: Payer: Managed Care, Other (non HMO) | Admitting: Nutrition

## 2015-12-05 ENCOUNTER — Encounter (HOSPITAL_COMMUNITY): Payer: Self-pay | Admitting: *Deleted

## 2015-12-05 DIAGNOSIS — C154 Malignant neoplasm of middle third of esophagus: Secondary | ICD-10-CM | POA: Insufficient documentation

## 2015-12-05 DIAGNOSIS — Z87891 Personal history of nicotine dependence: Secondary | ICD-10-CM | POA: Insufficient documentation

## 2015-12-05 DIAGNOSIS — C159 Malignant neoplasm of esophagus, unspecified: Secondary | ICD-10-CM | POA: Diagnosis not present

## 2015-12-05 DIAGNOSIS — C153 Malignant neoplasm of upper third of esophagus: Secondary | ICD-10-CM | POA: Insufficient documentation

## 2015-12-05 HISTORY — PX: EUS: SHX5427

## 2015-12-05 LAB — CBC WITH DIFFERENTIAL/PLATELET
Basophils Absolute: 0 10*3/uL (ref 0.0–0.1)
Basophils Relative: 0 %
Eosinophils Absolute: 0.2 10*3/uL (ref 0.0–0.7)
Eosinophils Relative: 4 %
HCT: 38.1 % — ABNORMAL LOW (ref 39.0–52.0)
Hemoglobin: 12.4 g/dL — ABNORMAL LOW (ref 13.0–17.0)
Lymphocytes Relative: 33 %
Lymphs Abs: 1.8 10*3/uL (ref 0.7–4.0)
MCH: 23.4 pg — ABNORMAL LOW (ref 26.0–34.0)
MCHC: 32.5 g/dL (ref 30.0–36.0)
MCV: 71.9 fL — ABNORMAL LOW (ref 78.0–100.0)
Monocytes Absolute: 0.6 10*3/uL (ref 0.1–1.0)
Monocytes Relative: 11 %
Neutro Abs: 2.9 10*3/uL (ref 1.7–7.7)
Neutrophils Relative %: 52 %
Platelets: 297 10*3/uL (ref 150–400)
RBC: 5.3 MIL/uL (ref 4.22–5.81)
RDW: 14.6 % (ref 11.5–15.5)
WBC: 5.5 10*3/uL (ref 4.0–10.5)

## 2015-12-05 LAB — GLUCOSE, CAPILLARY: Glucose-Capillary: 118 mg/dL — ABNORMAL HIGH (ref 65–99)

## 2015-12-05 LAB — D-DIMER, QUANTITATIVE: D-Dimer, Quant: 0.33 ug/mL-FEU (ref 0.00–0.50)

## 2015-12-05 SURGERY — UPPER ENDOSCOPIC ULTRASOUND (EUS) LINEAR
Anesthesia: Moderate Sedation

## 2015-12-05 MED ORDER — DIPHENHYDRAMINE HCL 50 MG/ML IJ SOLN
INTRAMUSCULAR | Status: AC
  Filled 2015-12-05: qty 1

## 2015-12-05 MED ORDER — HYDROMORPHONE HCL 1 MG/ML IJ SOLN
0.5000 mg | Freq: Once | INTRAMUSCULAR | Status: AC
  Administered 2015-12-05: 0.5 mg via INTRAVENOUS
  Filled 2015-12-05: qty 1

## 2015-12-05 MED ORDER — BUTAMBEN-TETRACAINE-BENZOCAINE 2-2-14 % EX AERO
INHALATION_SPRAY | CUTANEOUS | Status: DC | PRN
  Administered 2015-12-05: 2 via TOPICAL

## 2015-12-05 MED ORDER — MIDAZOLAM HCL 5 MG/ML IJ SOLN
INTRAMUSCULAR | Status: AC
  Filled 2015-12-05: qty 2

## 2015-12-05 MED ORDER — FENTANYL CITRATE (PF) 100 MCG/2ML IJ SOLN
INTRAMUSCULAR | Status: AC
  Filled 2015-12-05: qty 4

## 2015-12-05 MED ORDER — FENTANYL CITRATE (PF) 100 MCG/2ML IJ SOLN
INTRAMUSCULAR | Status: DC | PRN
  Administered 2015-12-05 (×2): 25 ug via INTRAVENOUS

## 2015-12-05 MED ORDER — DIPHENHYDRAMINE HCL 50 MG/ML IJ SOLN
INTRAMUSCULAR | Status: DC | PRN
  Administered 2015-12-05 (×2): 25 mg via INTRAVENOUS

## 2015-12-05 MED ORDER — SODIUM CHLORIDE 0.9 % IV SOLN
INTRAVENOUS | Status: DC
  Administered 2015-12-05: 14:00:00 via INTRAVENOUS

## 2015-12-05 MED ORDER — MIDAZOLAM HCL 10 MG/2ML IJ SOLN
INTRAMUSCULAR | Status: DC | PRN
  Administered 2015-12-05 (×2): 1 mg via INTRAVENOUS
  Administered 2015-12-05: 2 mg via INTRAVENOUS
  Administered 2015-12-05: 1 mg via INTRAVENOUS

## 2015-12-05 NOTE — Discharge Instructions (Signed)
YOU HAD AN ENDOSCOPIC PROCEDURE TODAY: Refer to the procedure report and other information in the discharge instructions given to you for any specific questions about what was found during the examination. If this information does not answer your questions, please call [facility] at [phone] to clarify.   YOU SHOULD EXPECT: Some feelings of bloating in the abdomen. Passage of more gas than usual. Walking can help get rid of the air that was put into your GI tract during the procedure and reduce the bloating. If you had a lower endoscopy (such as a colonoscopy or flexible sigmoidoscopy) you may notice spotting of blood in your stool or on the toilet paper. Some abdominal soreness may be present for a day or two, also.  DIET: Your first meal following the procedure should be a light meal and then it is ok to progress to your normal diet. A half-sandwich or bowl of soup is an example of a good first meal. Heavy or fried foods are harder to digest and may make you feel nauseous or bloated. Drink plenty of fluids but you should avoid alcoholic beverages for 24 hours. If you had an esophageal dilation, please see attached information for diet.   ACTIVITY: Your care partner should take you home directly after the procedure. You should plan to take it easy, moving slowly for the rest of the day. You can resume normal activity the day after the procedure however YOU SHOULD NOT DRIVE, use power tools, machinery or perform tasks that involve climbing or major physical exertion for 24 hours (because of the sedation medicines used during the test).   SYMPTOMS TO REPORT IMMEDIATELY: A gastroenterologist can be reached at any hour. Please call [phone]  for any of the following symptoms:  Following lower endoscopy (colonoscopy, flexible sigmoidoscopy) Excessive amounts of blood in the stool  Significant tenderness, worsening of abdominal pains  Swelling of the abdomen that is new, acute  Fever of  100 or higher  Following upper endoscopy (EGD, EUS, ERCP, esophageal dilation) Vomiting of blood or coffee ground material  New, significant abdominal pain  New, significant chest pain or pain under the shoulder blades  Painful or persistently difficult swallowing  New shortness of breath  Black, tarry-looking or red, bloody stools  FOLLOW UP:  If any biopsies were taken you will be contacted by phone or by letter within the next 1-3 weeks. Call [phone]  if you have not heard about the biopsies in 3 weeks.  Please also call with any specific questions about appointments or follow up tests. Moderate Conscious Sedation, Adult, Care After Refer to this sheet in the next few weeks. These instructions provide you with information on caring for yourself after your procedure. Your health care provider may also give you more specific instructions. Your treatment has been planned according to current medical practices, but problems sometimes occur. Call your health care provider if you have any problems or questions after your procedure. WHAT TO EXPECT AFTER THE PROCEDURE  After your procedure:  You may feel sleepy, clumsy, and have poor balance for several hours.  Vomiting may occur if you eat too soon after the procedure. HOME CARE INSTRUCTIONS  Do not participate in any activities where you could become injured for at least 24 hours. Do not:  Drive.  Swim.  Ride a bicycle.  Operate heavy machinery.  Cook.  Use power tools.  Climb ladders.  Work from a high place.  Do not make important decisions or sign legal  documents until you are improved.  If you vomit, drink water, juice, or soup when you can drink without vomiting. Make sure you have little or no nausea before eating solid foods.  Only take over-the-counter or prescription medicines for pain, discomfort, or fever as directed by your health care provider.  Make sure you and your family fully understand everything  about the medicines given to you, including what side effects may occur.  You should not drink alcohol, take sleeping pills, or take medicines that cause drowsiness for at least 24 hours.  If you smoke, do not smoke without supervision.  If you are feeling better, you may resume normal activities 24 hours after you were sedated.  Keep all appointments with your health care provider. SEEK MEDICAL CARE IF:  Your skin is pale or bluish in color.  You continue to feel nauseous or vomit.  Your pain is getting worse and is not helped by medicine.  You have bleeding or swelling.  You are still sleepy or feeling clumsy after 24 hours. SEEK IMMEDIATE MEDICAL CARE IF:  You develop a rash.  You have difficulty breathing.  You develop any type of allergic problem.  You have a fever. MAKE SURE YOU:  Understand these instructions.  Will watch your condition.  Will get help right away if you are not doing well or get worse.   This information is not intended to replace advice given to you by your health care provider. Make sure you discuss any questions you have with your health care provider.   Document Released: 01/04/2013 Document Revised: 04/06/2014 Document Reviewed: 01/04/2013 Elsevier Interactive Patient Education [employer].

## 2015-12-05 NOTE — Op Note (Signed)
[facility] Patient Name: Juan [name] Procedure Date: 12/05/2015 MRN: [id number] Attending MD: Carol Ada , MD Date of Birth: 08-06-1940 CSN: [id number] Age: 75 Admit Type: Outpatient Procedure:                Upper EUS Indications:              Pre-treatment staging of squamous cell esophageal                            neoplasm Providers:                Carol Ada, MD, Kingsley Plan, RN, Ralene Bathe,                            Technician Referring MD:              Medicines:                Midazolam 5 mg IV, Fentanyl 50 micrograms IV,                            Diphenhydramine 50 mg IV Complications:            No immediate complications. Estimated Blood Loss:     Estimated blood loss was minimal. Procedure:                Pre-Anesthesia Assessment:                           - Prior to the procedure, a History and Physical                            was performed, and patient medications and                            allergies were reviewed. The patient's tolerance of                            previous anesthesia was also reviewed. The risks                            and benefits of the procedure and the sedation                            options and risks were discussed with the patient.                            All questions were answered, and informed consent                            was obtained. Prior Anticoagulants: The patient has                            taken no previous anticoagulant or antiplatelet                            agents. ASA Grade  Assessment: III - A patient with                            severe systemic disease. After reviewing the risks                            and benefits, the patient was deemed in                            satisfactory condition to undergo the procedure.                           After obtaining informed consent, the endoscope was                            passed under direct vision. Throughout the                          procedure, the patient's blood pressure, pulse, and                            oxygen saturations were monitored continuously. The                            [id number]) scope was introduced through                            the mouth, and advanced to the second part of                            duodenum. The upper EUS was performed with                            difficulty due to the patient's agitation.                            Successful completion of the procedure was aided by                            increasing the dose of sedation medication. The                            patient tolerated the procedure poorly due to the                            patient's position intolerance. Scope In: Scope Out: Findings:      Endoscopic Finding :      A large, ulcerating mass with no bleeding and stigmata of recent       bleeding was found in the upper third of the esophagus and in the middle       third of the esophagus. The mass was non-obstructing and partially       circumferential (involving one-half of the lumen circumference).      Endosonographic Finding :      A  mass was found in the cervical esophagus and in the thoracic esophagus.      The procedure was difficult to perform as a result of patient tolerance       issues combined with rapid HR and severe HTN. In the upper to mid       esophagus a large protruding mass was identified. The surrounding mucosa       was friable and ulcerated. There was some initial difficulty with       passing the radial echoendoscope through this area. Because of the       patient's combativeness and the protrusion of the mass I was not able to       obtain an accurate measurement of the mass with endoscopic       visualization. Evaluation with ultrasound was challenging, but a large       eccentric hypoechoic mass with irregular borders was identified. In the       proximal portion it appeared to be  noncircumfirential, but in the mid       portion and distally the lesion eccentrically circumfirential. At the       largest portion of the mass, it measured 12.8 mm in depth. At this       point, the mass protruded beyond the adventitia (T3). In the area it was       not clear to me if the mass extended or if it was a collection of lymph       nodes. A couple of peritumoral lymph nodes were identified just proximal       to this area. Evaluation of the Celiac axis was negative for any       evidence of lymph nodes. The left lobe of the liver was negative for any       suspicious lesions and the left adrenal was normal in appearance. Impression:               - Malignant esophageal tumor was found in the upper                            third of the esophagus and in the middle third of                            the esophagus.                           - A mass was found in the cervical esophagus and in                            the thoracic esophagus. The diagnosis is squamous                            cell carcinoma. This was staged T3 N1/N2 Mx by                            endosonographic criteria.                           - No specimens collected. Moderate Sedation:      Moderate (conscious) sedation was administered by the endoscopy nurse  and supervised by the endoscopist. The following parameters were       monitored: oxygen saturation, heart rate, blood pressure, and response       to care. Recommendation:           - Discharge patient to home (ambulatory). Procedure Code(s):        --- Professional ---                           [phone], Esophagogastroduodenoscopy, flexible,                            transoral; with endoscopic ultrasound examination                            limited to the esophagus, stomach or duodenum, and                            adjacent structures Diagnosis Code(s):        --- Professional ---                           C15.3, Malignant neoplasm of  upper third of                            esophagus                           C15.4, Malignant neoplasm of middle third of                            esophagus                           D49.0, Neoplasm of unspecified behavior of                            digestive system CPT copyright 2016 American Medical Association. All rights reserved. The codes documented in this report are preliminary and upon coder review may  be revised to meet current compliance requirements. Carol Ada, MD Carol Ada, MD 12/05/2015 4:28:10 PM This report has been signed electronically. Number of Addenda: 0

## 2015-12-05 NOTE — Discharge Instructions (Signed)
Take your pain medicine as needed. Follow-up with your oncologist for further pain control.

## 2015-12-05 NOTE — H&P (Signed)
  Juan [name] HPI: 75 year old male diagnosed with SCC of the esophagus.  He is here today for EUS staging.  Past Medical History:  Diagnosis Date  . Esophageal cancer ([place])   . Reflux     Past Surgical History:  Procedure Laterality Date  . HERNIA REPAIR    . SHOULDER ARTHROSCOPY W/ SUPERIOR LABRAL ANTERIOR POSTERIOR LESION REPAIR      Family History  Problem Relation Age of Onset  . Colon cancer Father 53  . Colon cancer Brother 34    Social History:  reports that he quit smoking about 16 years ago. He has a 80.00 pack-year smoking history. He has never used smokeless tobacco. He reports that he does not drink alcohol or use drugs.  Allergies: No Known Allergies  Medications:  Scheduled:  Continuous: . sodium chloride 20 mL/hr at 12/05/15 1345    Results for orders placed or performed during the hospital encounter of 12/05/15 (from the past 24 hour(s))  Glucose, capillary     Status: Abnormal   Collection Time: 12/05/15  1:49 PM  Result Value Ref Range   Glucose-Capillary 118 (H) 65 - 99 mg/dL     Dg Chest 2 View  Result Date: 12/04/2015 CLINICAL DATA:  75 year old male with intermittent midsternal chest pain. Recent diagnosis of esophageal cancer. EXAM: CHEST  2 VIEW COMPARISON:  PETCT dated 12/03/2015 FINDINGS: The lungs are clear. There is no pleural effusion or pneumothorax. The cardiac silhouette is within normal limits. No acute osseous pathology. IMPRESSION: No active cardiopulmonary disease. Electronically Signed   By: Anner Crete M.D.   On: 12/04/2015 23:51    ROS:  As stated above in the HPI otherwise negative.  Blood pressure 124/73, pulse (!) 54, resp. rate 16, height 5\' 9"  (1.753 m), weight 78.5 kg (173 lb), SpO2 99 %.    PE: Gen: NAD, Alert and Oriented HEENT:  [name], EOMI Neck: Supple, no LAD Lungs: CTA Bilaterally CV: RRR without M/G/R ABM: Soft, NTND, +BS Ext: No C/C/E  Assessment/Plan: 1) SCC of the esophagus - EUS.  Juan [name]  D 12/05/2015, 3:21 PM

## 2015-12-06 ENCOUNTER — Ambulatory Visit: Payer: Managed Care, Other (non HMO)

## 2015-12-06 ENCOUNTER — Encounter (HOSPITAL_COMMUNITY): Payer: Self-pay | Admitting: Gastroenterology

## 2015-12-06 DIAGNOSIS — R634 Abnormal weight loss: Secondary | ICD-10-CM | POA: Diagnosis not present

## 2015-12-06 DIAGNOSIS — R1310 Dysphagia, unspecified: Secondary | ICD-10-CM | POA: Diagnosis not present

## 2015-12-06 DIAGNOSIS — Z800 Family history of malignant neoplasm of digestive organs: Secondary | ICD-10-CM | POA: Diagnosis not present

## 2015-12-06 DIAGNOSIS — R001 Bradycardia, unspecified: Secondary | ICD-10-CM | POA: Diagnosis not present

## 2015-12-06 DIAGNOSIS — C154 Malignant neoplasm of middle third of esophagus: Secondary | ICD-10-CM | POA: Diagnosis not present

## 2015-12-06 DIAGNOSIS — M546 Pain in thoracic spine: Secondary | ICD-10-CM | POA: Diagnosis not present

## 2015-12-06 DIAGNOSIS — Z7982 Long term (current) use of aspirin: Secondary | ICD-10-CM | POA: Diagnosis not present

## 2015-12-06 DIAGNOSIS — K219 Gastro-esophageal reflux disease without esophagitis: Secondary | ICD-10-CM | POA: Diagnosis not present

## 2015-12-06 DIAGNOSIS — Z7984 Long term (current) use of oral hypoglycemic drugs: Secondary | ICD-10-CM | POA: Diagnosis not present

## 2015-12-09 ENCOUNTER — Ambulatory Visit: Payer: Managed Care, Other (non HMO)

## 2015-12-09 ENCOUNTER — Other Ambulatory Visit: Payer: Self-pay | Admitting: Radiation Oncology

## 2015-12-09 ENCOUNTER — Ambulatory Visit
Admission: RE | Admit: 2015-12-09 | Discharge: 2015-12-09 | Disposition: A | Discharge status: Home / Self Care | Payer: Managed Care, Other (non HMO) | Source: Ambulatory Visit | Attending: Radiation Oncology | Admitting: Radiation Oncology

## 2015-12-09 ENCOUNTER — Encounter: Payer: Self-pay | Admitting: Radiation Oncology

## 2015-12-09 DIAGNOSIS — Z800 Family history of malignant neoplasm of digestive organs: Secondary | ICD-10-CM | POA: Diagnosis not present

## 2015-12-09 DIAGNOSIS — C154 Malignant neoplasm of middle third of esophagus: Secondary | ICD-10-CM | POA: Diagnosis not present

## 2015-12-09 DIAGNOSIS — R1310 Dysphagia, unspecified: Secondary | ICD-10-CM | POA: Diagnosis not present

## 2015-12-09 DIAGNOSIS — Z7984 Long term (current) use of oral hypoglycemic drugs: Secondary | ICD-10-CM | POA: Diagnosis not present

## 2015-12-09 DIAGNOSIS — M546 Pain in thoracic spine: Secondary | ICD-10-CM | POA: Diagnosis not present

## 2015-12-09 DIAGNOSIS — Z7982 Long term (current) use of aspirin: Secondary | ICD-10-CM | POA: Diagnosis not present

## 2015-12-09 DIAGNOSIS — R634 Abnormal weight loss: Secondary | ICD-10-CM | POA: Diagnosis not present

## 2015-12-09 DIAGNOSIS — R001 Bradycardia, unspecified: Secondary | ICD-10-CM | POA: Diagnosis not present

## 2015-12-09 DIAGNOSIS — K219 Gastro-esophageal reflux disease without esophagitis: Secondary | ICD-10-CM | POA: Diagnosis not present

## 2015-12-09 MED ORDER — SONAFINE EX EMUL
1.0000 "application " | Freq: Two times a day (BID) | CUTANEOUS | Status: DC
  Administered 2015-12-09: 1 via TOPICAL

## 2015-12-09 MED ORDER — OXYCODONE-ACETAMINOPHEN 5-325 MG PO TABS: 1.0000 | ORAL_TABLET | Freq: Four times a day (QID) | ORAL | 0 refills | Status: DC | PRN

## 2015-12-09 NOTE — Progress Notes (Signed)
Patient education done, sonafine cream, Radiation therapy and you book, my business card, discussed ways to manage side effects, skin irritation,pain, difficulty swallowing , fatigue, weight loss, dehydration, cough, may need to eat soft foods 5-6 smaller meals with snacks in between, may need IVF"S loss of chest hair, rx for percocet to be refilled by MD, verbal understanding,teach back

## 2015-12-09 NOTE — Progress Notes (Signed)
Paperwork received 9/11, faxed to [facility] @ [phone], conf received, copy given to patient 9/12

## 2015-12-10 ENCOUNTER — Ambulatory Visit: Payer: Managed Care, Other (non HMO) | Admitting: Nutrition

## 2015-12-10 ENCOUNTER — Telehealth: Payer: Self-pay

## 2015-12-10 ENCOUNTER — Ambulatory Visit
Admission: RE | Admit: 2015-12-10 | Discharge: 2015-12-10 | Disposition: A | Discharge status: Home / Self Care | Payer: Managed Care, Other (non HMO) | Source: Ambulatory Visit | Attending: Radiation Oncology | Admitting: Radiation Oncology

## 2015-12-10 ENCOUNTER — Ambulatory Visit: Payer: Managed Care, Other (non HMO)

## 2015-12-10 DIAGNOSIS — M546 Pain in thoracic spine: Secondary | ICD-10-CM | POA: Diagnosis not present

## 2015-12-10 DIAGNOSIS — Z7982 Long term (current) use of aspirin: Secondary | ICD-10-CM | POA: Diagnosis not present

## 2015-12-10 DIAGNOSIS — R634 Abnormal weight loss: Secondary | ICD-10-CM | POA: Diagnosis not present

## 2015-12-10 DIAGNOSIS — Z800 Family history of malignant neoplasm of digestive organs: Secondary | ICD-10-CM | POA: Diagnosis not present

## 2015-12-10 DIAGNOSIS — K219 Gastro-esophageal reflux disease without esophagitis: Secondary | ICD-10-CM | POA: Diagnosis not present

## 2015-12-10 DIAGNOSIS — R1310 Dysphagia, unspecified: Secondary | ICD-10-CM | POA: Diagnosis not present

## 2015-12-10 DIAGNOSIS — R001 Bradycardia, unspecified: Secondary | ICD-10-CM | POA: Diagnosis not present

## 2015-12-10 DIAGNOSIS — C154 Malignant neoplasm of middle third of esophagus: Secondary | ICD-10-CM | POA: Diagnosis not present

## 2015-12-10 DIAGNOSIS — Z7984 Long term (current) use of oral hypoglycemic drugs: Secondary | ICD-10-CM | POA: Diagnosis not present

## 2015-12-10 NOTE — Telephone Encounter (Signed)
Called to inform that [place] [facility] paperwork is ready and he can come pick it up from the front desk.  Pt verbalized understanding

## 2015-12-10 NOTE — Progress Notes (Signed)
Nutrition follow-up was completed with patient receiving chemotherapy and radiation treatments for esophageal cancer. Weight decreased and documented as 173 pounds down from 175 pounds August 25. Patient reports he can swallow liquids without difficulty and is chewing soft solid foods. He does tolerate chopped meats with gravies or sauces. He is drinking 2 oral nutrition supplements daily. He is wondering about side effects from chemotherapy.  Nutrition diagnosis: Unintended weight loss continues.  Intervention: Educated patient to increase oral nutrition supplements 3 times a day between meals. Provided one complementary case of Ensure Plus. Encouraged patient to continue strategies for chopping meats and adding gravies and sauces for easier swallowing. Reviewed potential nutrition impact symptoms from chemotherapy. Questions were answered.  Teach back method used.  Monitoring, evaluation, goals:  Patient will tolerate increased calories and protein to minimize weight loss.  Next visit: Wednesday, September 20, during infusion.  **Disclaimer: This note was dictated with voice recognition software. Similar sounding words can inadvertently be transcribed and this note may contain transcription errors which may not have been corrected upon publication of note.**

## 2015-12-11 ENCOUNTER — Encounter: Payer: Self-pay | Admitting: *Deleted

## 2015-12-11 ENCOUNTER — Ambulatory Visit (HOSPITAL_BASED_OUTPATIENT_CLINIC_OR_DEPARTMENT_OTHER): Payer: Managed Care, Other (non HMO)

## 2015-12-11 ENCOUNTER — Encounter: Payer: Self-pay | Admitting: Hematology

## 2015-12-11 ENCOUNTER — Ambulatory Visit: Payer: Managed Care, Other (non HMO)

## 2015-12-11 ENCOUNTER — Encounter: Payer: Self-pay | Admitting: Radiation Oncology

## 2015-12-11 ENCOUNTER — Ambulatory Visit
Admission: RE | Admit: 2015-12-11 | Discharge: 2015-12-11 | Disposition: A | Discharge status: Home / Self Care | Payer: Managed Care, Other (non HMO) | Source: Ambulatory Visit | Attending: Radiation Oncology | Admitting: Radiation Oncology

## 2015-12-11 ENCOUNTER — Other Ambulatory Visit (HOSPITAL_BASED_OUTPATIENT_CLINIC_OR_DEPARTMENT_OTHER): Payer: Managed Care, Other (non HMO)

## 2015-12-11 ENCOUNTER — Ambulatory Visit (HOSPITAL_BASED_OUTPATIENT_CLINIC_OR_DEPARTMENT_OTHER): Payer: Managed Care, Other (non HMO) | Admitting: Hematology

## 2015-12-11 VITALS — BP 124/76 | HR 71 | Temp 98.6°F | Resp 18 | Ht 69.0 in | Wt 169.5 lb

## 2015-12-11 VITALS — BP 114/69 | HR 56 | Temp 98.7°F | Resp 17

## 2015-12-11 DIAGNOSIS — R6889 Other general symptoms and signs: Secondary | ICD-10-CM | POA: Diagnosis not present

## 2015-12-11 DIAGNOSIS — Z800 Family history of malignant neoplasm of digestive organs: Secondary | ICD-10-CM | POA: Diagnosis not present

## 2015-12-11 DIAGNOSIS — R634 Abnormal weight loss: Secondary | ICD-10-CM | POA: Diagnosis not present

## 2015-12-11 DIAGNOSIS — Z7982 Long term (current) use of aspirin: Secondary | ICD-10-CM | POA: Diagnosis not present

## 2015-12-11 DIAGNOSIS — R1310 Dysphagia, unspecified: Secondary | ICD-10-CM

## 2015-12-11 DIAGNOSIS — R001 Bradycardia, unspecified: Secondary | ICD-10-CM | POA: Diagnosis not present

## 2015-12-11 DIAGNOSIS — C154 Malignant neoplasm of middle third of esophagus: Secondary | ICD-10-CM

## 2015-12-11 DIAGNOSIS — Z5111 Encounter for antineoplastic chemotherapy: Secondary | ICD-10-CM

## 2015-12-11 DIAGNOSIS — M546 Pain in thoracic spine: Secondary | ICD-10-CM | POA: Diagnosis not present

## 2015-12-11 DIAGNOSIS — K219 Gastro-esophageal reflux disease without esophagitis: Secondary | ICD-10-CM | POA: Diagnosis not present

## 2015-12-11 DIAGNOSIS — Z7984 Long term (current) use of oral hypoglycemic drugs: Secondary | ICD-10-CM | POA: Diagnosis not present

## 2015-12-11 LAB — COMPREHENSIVE METABOLIC PANEL
ALT: 13 U/L (ref 0–55)
AST: 13 U/L (ref 5–34)
Albumin: 3.7 g/dL (ref 3.5–5.0)
Alkaline Phosphatase: 100 U/L (ref 40–150)
Anion Gap: 10 mEq/L (ref 3–11)
BUN: 10.9 mg/dL (ref 7.0–26.0)
CO2: 24 mEq/L (ref 22–29)
Calcium: 9.8 mg/dL (ref 8.4–10.4)
Chloride: 103 mEq/L (ref 98–109)
Creatinine: 0.8 mg/dL (ref 0.7–1.3)
EGFR: >90 mL/min/{1.73_m2} (ref >90)
Glucose: 117 mg/dl (ref 70–140)
Potassium: 3.9 mEq/L (ref 3.5–5.1)
Sodium: 137 mEq/L (ref 136–145)
Total Bilirubin: 0.53 mg/dL (ref 0.20–1.20)
Total Protein: 8 g/dL (ref 6.4–8.3)

## 2015-12-11 LAB — CBC WITH DIFFERENTIAL/PLATELET
BASO%: 0.1 % (ref 0.0–2.0)
Basophils Absolute: 0 10*3/uL (ref 0.0–0.1)
EOS%: 1.8 % (ref 0.0–7.0)
Eosinophils Absolute: 0.1 10*3/uL (ref 0.0–0.5)
HCT: 40.4 % (ref 38.4–49.9)
HGB: 12.9 g/dL — ABNORMAL LOW (ref 13.0–17.1)
LYMPH%: 16.2 % (ref 14.0–49.0)
MCH: 23.1 pg — ABNORMAL LOW (ref 27.2–33.4)
MCHC: 31.9 g/dL — ABNORMAL LOW (ref 32.0–36.0)
MCV: 72.4 fL — ABNORMAL LOW (ref 79.3–98.0)
MONO#: 0.8 10*3/uL (ref 0.1–0.9)
MONO%: 11.3 % (ref 0.0–14.0)
NEUT#: 5.1 10*3/uL (ref 1.5–6.5)
NEUT%: 70.6 % (ref 39.0–75.0)
Platelets: 304 10*3/uL (ref 140–400)
RBC: 5.58 10*6/uL (ref 4.20–5.82)
RDW: 14.4 % (ref 11.0–14.6)
WBC: 7.3 10*3/uL (ref 4.0–10.3)
lymph#: 1.2 10*3/uL (ref 0.9–3.3)

## 2015-12-11 MED ORDER — PALONOSETRON HCL INJECTION 0.25 MG/5ML
0.2500 mg | Freq: Once | INTRAVENOUS | Status: AC
  Administered 2015-12-11: 0.25 mg via INTRAVENOUS

## 2015-12-11 MED ORDER — ONDANSETRON HCL 8 MG PO TABS: 8.0000 mg | ORAL_TABLET | Freq: Two times a day (BID) | ORAL | 1 refills | Status: DC | PRN

## 2015-12-11 MED ORDER — SODIUM CHLORIDE 0.9 % IV SOLN
45.0000 mg/m2 | Freq: Once | INTRAVENOUS | Status: AC
  Administered 2015-12-11: 90 mg via INTRAVENOUS
  Filled 2015-12-11: qty 15

## 2015-12-11 MED ORDER — DIPHENHYDRAMINE HCL 50 MG/ML IJ SOLN
INTRAMUSCULAR | Status: AC
  Filled 2015-12-11: qty 1

## 2015-12-11 MED ORDER — PALONOSETRON HCL INJECTION 0.25 MG/5ML
INTRAVENOUS | Status: AC
  Filled 2015-12-11: qty 5

## 2015-12-11 MED ORDER — DIPHENHYDRAMINE HCL 50 MG/ML IJ SOLN
50.0000 mg | Freq: Once | INTRAMUSCULAR | Status: AC
  Administered 2015-12-11: 50 mg via INTRAVENOUS

## 2015-12-11 MED ORDER — PROCHLORPERAZINE MALEATE 10 MG PO TABS: 10.0000 mg | ORAL_TABLET | Freq: Four times a day (QID) | ORAL | 1 refills | Status: DC | PRN

## 2015-12-11 MED ORDER — FAMOTIDINE IN NACL 20-0.9 MG/50ML-% IV SOLN
INTRAVENOUS | Status: AC
  Filled 2015-12-11: qty 50

## 2015-12-11 MED ORDER — SODIUM CHLORIDE 0.9 % IV SOLN
Freq: Once | INTRAVENOUS | Status: AC
  Administered 2015-12-11: 14:00:00 via INTRAVENOUS

## 2015-12-11 MED ORDER — FAMOTIDINE IN NACL 20-0.9 MG/50ML-% IV SOLN
20.0000 mg | Freq: Once | INTRAVENOUS | Status: AC
  Administered 2015-12-11: 20 mg via INTRAVENOUS

## 2015-12-11 MED ORDER — SODIUM CHLORIDE 0.9 % IV SOLN
193.4000 mg | Freq: Once | INTRAVENOUS | Status: AC
  Administered 2015-12-11: 190 mg via INTRAVENOUS
  Filled 2015-12-11: qty 19

## 2015-12-11 MED ORDER — SODIUM CHLORIDE 0.9 % IV SOLN
10.0000 mg | Freq: Once | INTRAVENOUS | Status: AC
  Administered 2015-12-11: 10 mg via INTRAVENOUS
  Filled 2015-12-11: qty 1

## 2015-12-11 NOTE — Progress Notes (Signed)
Paperwork sent back to [facility] @ [phone], conf received, copy given to patient

## 2015-12-11 NOTE — Patient Instructions (Signed)
[name] Discharge Instructions for Patients Receiving Chemotherapy  Today you received the following chemotherapy agents:  Carboplatin (paraplatin), Taxol (paclitaxel)  To help prevent nausea and vomiting after your treatment, we encourage you to take your nausea medication as prescribed.   If you develop nausea and vomiting that is not controlled by your nausea medication, call the clinic.   BELOW ARE SYMPTOMS THAT SHOULD BE REPORTED IMMEDIATELY:  *FEVER GREATER THAN 100.5 F  *CHILLS WITH OR WITHOUT FEVER  NAUSEA AND VOMITING THAT IS NOT CONTROLLED WITH YOUR NAUSEA MEDICATION  *UNUSUAL SHORTNESS OF BREATH  *UNUSUAL BRUISING OR BLEEDING  TENDERNESS IN MOUTH AND THROAT WITH OR WITHOUT PRESENCE OF ULCERS  *URINARY PROBLEMS  *BOWEL PROBLEMS  UNUSUAL RASH Items with * indicate a potential emergency and should be followed up as soon as possible.  Feel free to call the clinic you have any questions or concerns. The clinic phone number is [phone].  Please show the [place] at check-in to the Emergency Department and triage nurse.  Paclitaxel injection What is this medicine? PACLITAXEL (PAK li TAX el) is a chemotherapy drug. It targets fast dividing cells, like cancer cells, and causes these cells to die. This medicine is used to treat ovarian cancer, breast cancer, and other cancers. This medicine may be used for other purposes; ask your health care provider or pharmacist if you have questions. What should I tell my health care provider before I take this medicine? They need to know if you have any of these conditions: -blood disorders -irregular heartbeat -infection (especially a virus infection such as chickenpox, cold sores, or herpes) -liver disease -previous or ongoing radiation therapy -an unusual or allergic reaction to paclitaxel, alcohol, polyoxyethylated castor oil, other chemotherapy agents, other medicines, foods, dyes, or  preservatives -pregnant or trying to get pregnant -breast-feeding How should I use this medicine? This drug is given as an infusion into a vein. It is administered in a hospital or clinic by a specially trained health care professional. Talk to your pediatrician regarding the use of this medicine in children. Special care may be needed. Overdosage: If you think you have taken too much of this medicine contact a poison control center or emergency room at once. NOTE: This medicine is only for you. Do not share this medicine with others. What if I miss a dose? It is important not to miss your dose. Call your doctor or health care professional if you are unable to keep an appointment. What may interact with this medicine? Do not take this medicine with any of the following medications: -disulfiram -metronidazole This medicine may also interact with the following medications: -cyclosporine -diazepam -ketoconazole -medicines to increase blood counts like filgrastim, pegfilgrastim, sargramostim -other chemotherapy drugs like cisplatin, doxorubicin, epirubicin, etoposide, teniposide, vincristine -quinidine -testosterone -vaccines -verapamil Talk to your doctor or health care professional before taking any of these medicines: -acetaminophen -aspirin -ibuprofen -ketoprofen -naproxen This list may not describe all possible interactions. Give your health care provider a list of all the medicines, herbs, non-prescription drugs, or dietary supplements you use. Also tell them if you smoke, drink alcohol, or use illegal drugs. Some items may interact with your medicine. What should I watch for while using this medicine? Your condition will be monitored carefully while you are receiving this medicine. You will need important blood work done while you are taking this medicine. This drug may make you feel generally unwell. This is not uncommon, as chemotherapy can affect healthy cells  as well as cancer  cells. Report any side effects. Continue your course of treatment even though you feel ill unless your doctor tells you to stop. This medicine can cause serious allergic reactions. To reduce your risk you will need to take other medicine(s) before treatment with this medicine. In some cases, you may be given additional medicines to help with side effects. Follow all directions for their use. Call your doctor or health care professional for advice if you get a fever, chills or sore throat, or other symptoms of a cold or flu. Do not treat yourself. This drug decreases your body's ability to fight infections. Try to avoid being around people who are sick. This medicine may increase your risk to bruise or bleed. Call your doctor or health care professional if you notice any unusual bleeding. Be careful brushing and flossing your teeth or using a toothpick because you may get an infection or bleed more easily. If you have any dental work done, tell your dentist you are receiving this medicine. Avoid taking products that contain aspirin, acetaminophen, ibuprofen, naproxen, or ketoprofen unless instructed by your doctor. These medicines may hide a fever. Do not become pregnant while taking this medicine. Women should inform their doctor if they wish to become pregnant or think they might be pregnant. There is a potential for serious side effects to an unborn child. Talk to your health care professional or pharmacist for more information. Do not breast-feed an infant while taking this medicine. Men are advised not to father a child while receiving this medicine. This product may contain alcohol. Ask your pharmacist or healthcare provider if this medicine contains alcohol. Be sure to tell all healthcare providers you are taking this medicine. Certain medicines, like metronidazole and disulfiram, can cause an unpleasant reaction when taken with alcohol. The reaction includes flushing, headache, nausea, vomiting,  sweating, and increased thirst. The reaction can last from 30 minutes to several hours. What side effects may I notice from receiving this medicine? Side effects that you should report to your doctor or health care professional as soon as possible: -allergic reactions like skin rash, itching or hives, swelling of the face, lips, or tongue -low blood counts - This drug may decrease the number of white blood cells, red blood cells and platelets. You may be at increased risk for infections and bleeding. -signs of infection - fever or chills, cough, sore throat, pain or difficulty passing urine -signs of decreased platelets or bleeding - bruising, pinpoint red spots on the skin, black, tarry stools, nosebleeds -signs of decreased red blood cells - unusually weak or tired, fainting spells, lightheadedness -breathing problems -chest pain -high or low blood pressure -mouth sores -nausea and vomiting -pain, swelling, redness or irritation at the injection site -pain, tingling, numbness in the hands or feet -slow or irregular heartbeat -swelling of the ankle, feet, hands Side effects that usually do not require medical attention (report to your doctor or health care professional if they continue or are bothersome): -bone pain -complete hair loss including hair on your head, underarms, pubic hair, eyebrows, and eyelashes -changes in the color of fingernails -diarrhea -loosening of the fingernails -loss of appetite -muscle or joint pain -red flush to skin -sweating This list may not describe all possible side effects. Call your doctor for medical advice about side effects. You may report side effects to FDA at 1-800-FDA-1088. Where should I keep my medicine? This drug is given in a hospital or clinic and will not be  stored at home. NOTE: This sheet is a summary. It may not cover all possible information. If you have questions about this medicine, talk to your doctor, pharmacist, or health care  provider.    2016, [employer]. (2014-11-01 13:02:56) Carboplatin injection What is this medicine? CARBOPLATIN (KAR boe pla tin) is a chemotherapy drug. It targets fast dividing cells, like cancer cells, and causes these cells to die. This medicine is used to treat ovarian cancer and many other cancers. This medicine may be used for other purposes; ask your health care provider or pharmacist if you have questions. What should I tell my health care provider before I take this medicine? They need to know if you have any of these conditions: -blood disorders -hearing problems -kidney disease -recent or ongoing radiation therapy -an unusual or allergic reaction to carboplatin, cisplatin, other chemotherapy, other medicines, foods, dyes, or preservatives -pregnant or trying to get pregnant -breast-feeding How should I use this medicine? This drug is usually given as an infusion into a vein. It is administered in a hospital or clinic by a specially trained health care professional. Talk to your pediatrician regarding the use of this medicine in children. Special care may be needed. Overdosage: If you think you have taken too much of this medicine contact a poison control center or emergency room at once. NOTE: This medicine is only for you. Do not share this medicine with others. What if I miss a dose? It is important not to miss a dose. Call your doctor or health care professional if you are unable to keep an appointment. What may interact with this medicine? -medicines for seizures -medicines to increase blood counts like filgrastim, pegfilgrastim, sargramostim -some antibiotics like amikacin, gentamicin, neomycin, streptomycin, tobramycin -vaccines Talk to your doctor or health care professional before taking any of these medicines: -acetaminophen -aspirin -ibuprofen -ketoprofen -naproxen This list may not describe all possible interactions. Give your health care provider a  list of all the medicines, herbs, non-prescription drugs, or dietary supplements you use. Also tell them if you smoke, drink alcohol, or use illegal drugs. Some items may interact with your medicine. What should I watch for while using this medicine? Your condition will be monitored carefully while you are receiving this medicine. You will need important blood work done while you are taking this medicine. This drug may make you feel generally unwell. This is not uncommon, as chemotherapy can affect healthy cells as well as cancer cells. Report any side effects. Continue your course of treatment even though you feel ill unless your doctor tells you to stop. In some cases, you may be given additional medicines to help with side effects. Follow all directions for their use. Call your doctor or health care professional for advice if you get a fever, chills or sore throat, or other symptoms of a cold or flu. Do not treat yourself. This drug decreases your body's ability to fight infections. Try to avoid being around people who are sick. This medicine may increase your risk to bruise or bleed. Call your doctor or health care professional if you notice any unusual bleeding. Be careful brushing and flossing your teeth or using a toothpick because you may get an infection or bleed more easily. If you have any dental work done, tell your dentist you are receiving this medicine. Avoid taking products that contain aspirin, acetaminophen, ibuprofen, naproxen, or ketoprofen unless instructed by your doctor. These medicines may hide a fever. Do not become pregnant while taking this  medicine. Women should inform their doctor if they wish to become pregnant or think they might be pregnant. There is a potential for serious side effects to an unborn child. Talk to your health care professional or pharmacist for more information. Do not breast-feed an infant while taking this medicine. What side effects may I notice from  receiving this medicine? Side effects that you should report to your doctor or health care professional as soon as possible: -allergic reactions like skin rash, itching or hives, swelling of the face, lips, or tongue -signs of infection - fever or chills, cough, sore throat, pain or difficulty passing urine -signs of decreased platelets or bleeding - bruising, pinpoint red spots on the skin, black, tarry stools, nosebleeds -signs of decreased red blood cells - unusually weak or tired, fainting spells, lightheadedness -breathing problems -changes in hearing -changes in vision -chest pain -high blood pressure -low blood counts - This drug may decrease the number of white blood cells, red blood cells and platelets. You may be at increased risk for infections and bleeding. -nausea and vomiting -pain, swelling, redness or irritation at the injection site -pain, tingling, numbness in the hands or feet -problems with balance, talking, walking -trouble passing urine or change in the amount of urine Side effects that usually do not require medical attention (report to your doctor or health care professional if they continue or are bothersome): -hair loss -loss of appetite -metallic taste in the mouth or changes in taste This list may not describe all possible side effects. Call your doctor for medical advice about side effects. You may report side effects to FDA at 1-800-FDA-1088. Where should I keep my medicine? This drug is given in a hospital or clinic and will not be stored at home. NOTE: This sheet is a summary. It may not cover all possible information. If you have questions about this medicine, talk to your doctor, pharmacist, or health care provider.    2016, [employer]. (2007-06-21 14:38:05)

## 2015-12-11 NOTE — Progress Notes (Signed)
[facility]  [phone] [phone]) [phone]  Clinic follow Up Note   Patient Care Team: Rama (Merrilee Seashore) [name], MD (Inactive) as PCP - General (Internal Medicine) 12/11/2015   CHIEF COMPLAINTS:  Follow up esophageal squamous cell carcinoma  Oncology History   Presented with dysphagia and odynophagia with 20-25 pounds of weight loss in about 3-4 months  Esophageal cancer ([place])   Staging form: Esophagus - Adenocarcinoma, AJCC 7th Edition   - Clinical stage from 11/13/2015: Stage IIIB (T3, N2, M0, G2) - Signed by [name], MD on 12/11/2015      Esophageal cancer ([place])   11/13/2015 Procedure    UPPER ENDOSCOPY per Dr. Collene Mares: Large fungating, friable bleeding mass in middle third of esophagus, 22cm from incisors and extended to 27cm. Non-obstructing.      11/13/2015 Pathology Results    Invasive squamous cell carcinoma; moderately differentiated      11/13/2015 Imaging    CT ABD/PELVIS: IMPRESSION:No evidence of metastatic disease in the abdomen or pelvis. 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 Aortoiliac atherosclerosis. Small bilateral inguinal hernias containing fat the      11/22/2015 Initial Diagnosis    Esophageal cancer ([place])     12/03/2015 Imaging    PET scan showed a long segment of hypermetabolic thickening in the mid esophagus consistent with esophageal carcinoma. No clear evidence of pedis tunnel node metastasis or distant metastasis.      12/10/2015 -  Radiation Therapy    Neoadjuvant radiation to his esophageal cancer      12/10/2015 -  Chemotherapy    Neoadjuvant weekly carboplatin AUC 2, and Taxol 45 mg/m, with concurrent radiation       HISTORY OF PRESENTING ILLNESS:  Juan [name] 75 y.o. male is here because of His recently diagnosed esophageal cancer. He is accompanied by his wife to my clinic today.  He started having sore throat and dysphagia about 4-5 months ago, and was seen by PCP, had 2 course antibiotics but is  symptom progressed. He was referred to gastroenterologist Dr. Suezanne Cheshire, and underwent EGD on 11/13/2015, which showed a large fungating, friable bleeding mass in the middle third of esophagus, 22 cm to 27 cm from incisors, non-obstructing. The biopsy showed squamous cell carcinoma. His CT abdomen and pelvis was negative for metastatic disease.  He has lost 20lbs, he is on soft diet and liquids, his pain is about 7/10 pain with swallowing. He also has chest pain , intermittent, it lasts about a few minutes, no nausea or vomiting, his bowel movement is normal. He denies melena or hematochezia. He is able to function and tolerated light activities at home.  CURRENT THERAPY: concurrent chemotherapy and radiation, with weekly carboplatin AUC 2 and Taxol 45 mg/m, started on 12/10/2015  INTERIM HISTORY: [name] returns for follow-up and her first dose chemotherapy. He started radiation yesterday, tolerating well so far. She still has persistent midchest pain, usually mild, but has worsening pain intermittently, especially after eating. He has been taking oxycodone 2-4 tablets a day. He denies any significant dysphagia, nausea, or other new symptoms. He lost 4 pounds in the past week. No other new complaints  MEDICAL HISTORY:  Past Medical History:  Diagnosis Date  . Esophageal cancer ([place])   . Reflux     SURGICAL HISTORY: Past Surgical History:  Procedure Laterality Date  . EUS N/A 12/05/2015   Procedure: UPPER ENDOSCOPIC ULTRASOUND (EUS) LINEAR;  Surgeon: Carol Ada, MD;  Location: [facility];  Service: Endoscopy;  Laterality: N/A;  .  HERNIA REPAIR    . SHOULDER ARTHROSCOPY W/ SUPERIOR LABRAL ANTERIOR POSTERIOR LESION REPAIR      SOCIAL HISTORY: Social History   Social History  . Marital status: Married    Spouse name: N/A  . Number of children: N/A  . Years of education: N/A   Occupational History  . Not on file.   Social History Main Topics  . Smoking status: Former Smoker     Packs/day: 2.00    Years: 40.00    Quit date: 03/31/1999  . Smokeless tobacco: Never Used  . Alcohol use No     Comment: weekend binge drinking for 30-40 years, quit in 2001  . Drug use: No  . Sexual activity: Not on file   Other Topics Concern  . 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 Colon cancer Father 70  . Colon cancer Brother 52    ALLERGIES:  has No Known Allergies.  MEDICATIONS:  Current Outpatient Prescriptions  Medication Sig Dispense Refill  . albuterol (PROVENTIL HFA;VENTOLIN HFA) 108 (90 BASE) MCG/ACT inhaler Inhale 2 puffs into the lungs every 4 (four) hours as needed for wheezing. 1 Inhaler 0  . amitriptyline (ELAVIL) 10 MG tablet Take 10 mg by mouth at bedtime. 11/16/15 - Take 1 tablet at Bedtime x 1 Week, then increase to 2 tablets Once Daily Orally- 30 days    . aspirin EC 81 MG tablet Take 81 mg by mouth daily.    Marland Kitchen atorvastatin (LIPITOR) 20 MG tablet Take 20 mg by mouth at bedtime.     . metFORMIN (GLUCOPHAGE) 500 MG tablet Take 500 mg by mouth daily with breakfast.    . omeprazole (PRILOSEC) 40 MG capsule Take 40 mg by mouth daily.     Marland Kitchen oxyCODONE-acetaminophen (PERCOCET/ROXICET) 5-325 MG tablet Take 1-2 tablets by mouth every 6 (six) hours as needed for severe pain. 120 tablet 0  . ondansetron (ZOFRAN) 8 MG tablet Take 1 tablet (8 mg total) by mouth 2 (two) times daily as needed for refractory nausea / vomiting. Start on day 3 after chemo. 30 tablet 1  . prochlorperazine (COMPAZINE) 10 MG tablet Take 1 tablet (10 mg total) by mouth every 6 (six) hours as needed (Nausea or vomiting). 30 tablet 1  . Wound Dressings (SONAFINE) Apply 1 application topically daily.     No current facility-administered medications for  this visit.     REVIEW OF SYSTEMS:   Constitutional: Denies fevers, chills or abnormal night sweats Eyes: Denies blurriness of vision, double vision or watery eyes Ears, nose, mouth, throat, and face: Denies mucositis or sore throat Respiratory: Denies cough, dyspnea or wheezes Cardiovascular: Denies palpitation, chest discomfort or lower extremity swelling Gastrointestinal:  Denies nausea, heartburn or change in bowel habits Skin: Denies abnormal skin rashes Lymphatics: Denies new lymphadenopathy or easy bruising Neurological:Denies numbness, tingling or new weaknesses Behavioral/Psych: Mood is stable, no new changes  All other systems were reviewed with the patient and are negative.  PHYSICAL EXAMINATION: ECOG PERFORMANCE STATUS: 1 - Symptomatic but completely ambulatory  Vitals:   12/11/15 1159  BP: 124/76  Pulse: 71  Resp: 18  Temp: 98.6 F (37 C)   Filed Weights   12/11/15 1159  Weight:  169 lb 8 oz (76.9 kg)    GENERAL:alert, no distress and comfortable SKIN: skin color, texture, turgor are normal, no rashes or significant lesions EYES: normal, conjunctiva are pink and non-injected, sclera clear OROPHARYNX:no exudate, no erythema and lips, buccal mucosa, and tongue normal  NECK: supple, thyroid normal size, non-tender, without nodularity LYMPH:  no palpable lymphadenopathy in the cervical, axillary or inguinal LUNGS: clear to auscultation and percussion with normal breathing effort HEART: regular rate & rhythm and no murmurs and no lower extremity edema ABDOMEN:abdomen soft, non-tender and normal bowel sounds Musculoskeletal:no cyanosis of digits and no clubbing  PSYCH: alert & oriented x 3 with fluent speech NEURO: no focal motor/sensory deficits  LABORATORY DATA:  I have reviewed the data as listed CBC Latest Ref Rng & Units 12/11/2015 12/04/2015 12/04/2015  WBC 4.0 - 10.3 10e3/uL 7.3 - 5.5  Hemoglobin 13.0 - 17.1 g/dL 12.9(L) 14.6 12.4(L)  Hematocrit 38.4 - 49.9 %  40.4 43.0 38.1(L)  Platelets 140 - 400 10e3/uL 304 - 297   CMP Latest Ref Rng & Units 12/11/2015 12/04/2015 12/04/2015  Glucose 70 - 140 mg/dl 117 104(H) 126  BUN 7.0 - 26.0 mg/dL 10.9 16 13.2  Creatinine 0.7 - 1.3 mg/dL 0.8 0.70 0.9  Sodium 136 - 145 mEq/L 137 137 139  Potassium 3.5 - 5.1 mEq/L 3.9 4.6 4.3  Chloride 101 - 111 mmol/L - 103 -  CO2 22 - 29 mEq/L 24 23 -  Calcium 8.4 - 10.4 mg/dL 9.8 9.5 -  Total Protein 6.4 - 8.3 g/dL 8.0 7.6 -  Total Bilirubin 0.20 - 1.20 mg/dL 0.53 0.32 -  Alkaline Phos 40 - 150 U/L 100 91 -  AST 5 - 34 U/L 13 15 -  ALT 0 - 55 U/L 13 10 -   PATHOLOGY REPORT  Final microscopic diagnosis Esophagus, 0.2 cm to 27 cm, biopsy -Invasive squamous cell carcinoma, moderately differentiated   RADIOGRAPHIC STUDIES: I have personally reviewed the radiological images as listed and agreed with the findings in the report. Dg Chest 2 View  Result Date: 12/04/2015 CLINICAL DATA:  75 year old male with intermittent midsternal chest pain. Recent diagnosis of esophageal cancer. EXAM: CHEST  2 VIEW COMPARISON:  PETCT dated 12/03/2015 FINDINGS: The lungs are clear. There is no pleural effusion or pneumothorax. The cardiac silhouette is within normal limits. No acute osseous pathology. IMPRESSION: No active cardiopulmonary disease. Electronically Signed   By: Anner Crete M.D.   On: 12/04/2015 23:51   Ct Abdomen Pelvis W Contrast  Result Date: 11/13/2015 CLINICAL DATA:  Esophageal mass found on endoscopy. Evaluate for metastasis. EXAM: CT ABDOMEN AND PELVIS WITH CONTRAST TECHNIQUE: Multidetector CT imaging of the abdomen and pelvis was performed using the standard protocol following bolus administration of intravenous contrast. CONTRAST:  110mL ISOVUE-300 IOPAMIDOL (ISOVUE-300) INJECTION 61% COMPARISON:  06/21/2014 FINDINGS: Lower chest: Lung bases are clear. No effusions. Heart is normal size. Hepatobiliary: No focal hepatic abnormality. Gallbladder unremarkable. Pancreas: No  focal abnormality or ductal dilatation. Spleen: Calcifications within the spleen compatible with old granulomatous disease. Normal size. Adrenals/Urinary Tract: Adrenal glands normal. Parapelvic cysts within the left kidney. No hydronephrosis urinary bladder decompressed, grossly unremarkable. Stomach/Bowel: Stomach, large and small bowel grossly unremarkable. Vascular/Lymphatic: Scattered aortic and iliac calcifications with tortuosity. No aneurysm. No adenopathy. Reproductive: No visible focal abnormality. Other: No free fluid or free air. Small bilateral inguinal hernias containing fat. Musculoskeletal: No acute bony abnormality or focal bone lesion. Degenerative changes in the lumbar spine. IMPRESSION: No evidence of metastatic  disease in the abdomen or pelvis. 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 Aortoiliac atherosclerosis. Small bilateral inguinal hernias containing fat the Electronically Signed   By: Rolm Baptise M.D.   On: 11/13/2015 11:26   Nm Pet Image Initial (pi) Skull Base To Thigh  Result Date: 12/03/2015 CLINICAL DATA:  Initial treatment strategy for esophageal cancer of the middle third of the esophagus. EXAM: NUCLEAR MEDICINE PET SKULL BASE TO THIGH TECHNIQUE: 12.7 mCi F-18 FDG was injected intravenously. Full-ring PET imaging was performed from the skull base to thigh after the radiotracer. CT data was obtained and used for attenuation correction and anatomic localization. FASTING BLOOD GLUCOSE:  Value: 11 mg/dl COMPARISON:  CT abdomen 11/13/2015 FINDINGS: NECK No hypermetabolic lymph nodes in the neck. CHEST A 3 cm segment of circumferential esophageal wall thickening at the level of the carina with intense metabolic activity (SUV max equal 18.2). Single wall thickness measures 1.5 cm High RIGHT paratracheal lymph node measures 7 mm (image 964, series 4) with low metabolic activity the hypermetabolic activity (SUV max equaled 2.1). No hypermetabolic paraesophageal lymph nodes. No suspicious  pulmonary nodules. ABDOMEN/PELVIS No hypermetabolic gastrohepatic ligament nodes. No abnormal metabolic activity in the liver. No hypermetabolic retroperitoneal or pelvic lymph nodes. Physiologic activity noted within the colon. SKELETON No focal hypermetabolic activity to suggest skeletal metastasis. IMPRESSION: 1. Long segment of hypermetabolic thickening in the mid esophagus consistent with esophageal carcinoma. 2. No clear evidence of mediastinal nodal metastasis. Single high RIGHT paratracheal lymph node with low metabolic activity 3. No evidence of liver metastasis or upper abdominal nodal metastasis Electronically Signed   By: Suzy Bouchard M.D.   On: 12/03/2015 10:53   EGD 11/13/2015 Dr. Collene Mares  -A large, fungating, friable mass with bleeding was found in the mid third of the esophagus, 22 cm from the incisors and extended to 27 cm. The mass was nonobstructing and a circumferential, biopsy was taken. -Normal stomach -Normal proximal small bowel  Colonoscopy 11/13/2015 Dr. Collene Mares -Diverticulosis in the entire exam: -No specimens collected  EUS 12/05/2015 Malignant esophageal tumor was found in the upper third of the esophagus and in the middle third of the esophagus. - A mass was found in the cervical esophagus and in the thoracic esophagus. The diagnosis is squamous cell carcinoma. This was staged T3 N1/N2 Mx by endosonographic criteria. - No specimens collected.   ASSESSMENT & PLAN: 75 year old gentleman, without significant past medical history except acid reflux, presented with progressive dysphagia and odynophagia and weight loss.  1. Esophageal cancer, squamous cell carcinoma, cT3N1-2M0, stage IIIB -I reviewed his EGD findings, biopsy results, and the CT scan findings with patient and his wife in great details -I dicussed staging PET scan findings with patient and his wife, the mid esophageal mass is hypermetabolic, no distant metastasis. -EUS revealed a T3 lesion, N1 vs N2, locally  advanced disease. -We recommend him to have new adjuvant radiation and chemotherapy with weekly carboplatin and Taxol -A prediabetic, I'll reduce his premedication dexamethasone to 10 mg -we will refer him to Dr. Servando Snare for surgical evaluation. -I again reviewed the potential side effects from chemotherapy, called in Compazine and Zofran to his pharmacy today -Weekly follow-up and lab during his chemoradiation  2. Dysphagia -He is able to tolerate a soft diet and drinks fluids adequately -follow up with dietician Pamala Hurry  -He is still losing weight, I strongly encouraged him to increase his nutritional supplements  3. Odynophagia -Dr. Lisbeth Renshaw has prescribed Percocet as needed for his pain, he will continue  -  I will adjust his pain medication if needed during his treatment   Recommendations -Start first week chemotherapy carbo and Taxol today -Lab, follow-up and treatment every week  All questions were answered. The patient knows to call the clinic with any problems, questions or concerns.  I spent 20 minutes counseling the patient face to face. The total time spent in the appointment was 25 minutes and more than 50% was on counseling.     [name], MD 12/11/2015 12:41 PM

## 2015-12-11 NOTE — Progress Notes (Signed)
Oncology Nurse Navigator Documentation  Oncology Nurse Navigator Flowsheets 12/11/2015  Navigator Location [facility]  Navigator Encounter Type Treatment  Telephone -  Abnormal Finding Date -  Confirmed Diagnosis Date -  Treatment Initiated Date 12/11/2015  Patient Visit Type MedOnc  Treatment Phase Active Tx--weekly Taxol/Carbo with RT  Barriers/Navigation Needs No barriers at this time;No Questions;No Needs  Education -  Interventions None required  Coordination of Care -  Education Method -Encouraged him to push fluids, especially the day of and before chemo to promote easier venous access. Reminded him to pick up his antiemetic on the way home today.  Support Groups/Services -  Acuity Level 1  Time Spent with Patient 15

## 2015-12-12 ENCOUNTER — Telehealth: Payer: Self-pay | Admitting: *Deleted

## 2015-12-12 ENCOUNTER — Encounter: Payer: Self-pay | Admitting: *Deleted

## 2015-12-12 ENCOUNTER — Ambulatory Visit: Payer: Managed Care, Other (non HMO)

## 2015-12-12 ENCOUNTER — Ambulatory Visit
Admission: RE | Admit: 2015-12-12 | Discharge: 2015-12-12 | Disposition: A | Discharge status: Home / Self Care | Payer: Managed Care, Other (non HMO) | Source: Ambulatory Visit | Attending: Radiation Oncology | Admitting: Radiation Oncology

## 2015-12-12 DIAGNOSIS — K219 Gastro-esophageal reflux disease without esophagitis: Secondary | ICD-10-CM | POA: Diagnosis not present

## 2015-12-12 DIAGNOSIS — M546 Pain in thoracic spine: Secondary | ICD-10-CM | POA: Diagnosis not present

## 2015-12-12 DIAGNOSIS — R1310 Dysphagia, unspecified: Secondary | ICD-10-CM | POA: Diagnosis not present

## 2015-12-12 DIAGNOSIS — C154 Malignant neoplasm of middle third of esophagus: Secondary | ICD-10-CM

## 2015-12-12 DIAGNOSIS — Z800 Family history of malignant neoplasm of digestive organs: Secondary | ICD-10-CM | POA: Diagnosis not present

## 2015-12-12 DIAGNOSIS — R634 Abnormal weight loss: Secondary | ICD-10-CM | POA: Diagnosis not present

## 2015-12-12 DIAGNOSIS — Z7984 Long term (current) use of oral hypoglycemic drugs: Secondary | ICD-10-CM | POA: Diagnosis not present

## 2015-12-12 DIAGNOSIS — Z7982 Long term (current) use of aspirin: Secondary | ICD-10-CM | POA: Diagnosis not present

## 2015-12-12 DIAGNOSIS — R001 Bradycardia, unspecified: Secondary | ICD-10-CM | POA: Diagnosis not present

## 2015-12-12 NOTE — Progress Notes (Signed)
Oncology Nurse Navigator Documentation  Oncology Nurse Navigator Flowsheets 11/27/2015 12/11/2015 12/12/2015  Navigator Location [facility]  Navigator Encounter Type Telephone Treatment Other  Telephone Outgoing Call;Appt Confirmation/Clarification - -  Abnormal Finding Date - - -  Confirmed Diagnosis Date - - -  Treatment Initiated Date - 12/11/2015 -  Patient Visit Type - MedOnc -  Treatment Phase Pre-Tx/Tx Discussion Active Tx -  Barriers/Navigation Needs Coordination of Care No barriers at this time;No Questions;No Needs Coordination of Care--CVTS  Education - - -  Interventions Coordination of Care None required Referral entered in EPIC per Dr. Burr Medico  Referrals - - Other--Dr. Servando Snare for surgery post chemo/RT  Coordination of Care Radiology - -  Education Method Verbal;Written - -  Support Groups/Services - - -  Acuity - Level 1 -  Time Spent with Patient [id number]

## 2015-12-12 NOTE — Telephone Encounter (Signed)
-----   Message from Paulla Dolly, RN sent at 12/11/2015  4:31 PM EDT ----- Regarding: first chemo Dr Richardson Dopp: [phone] First chemo.  Dr Ernestina Penna pt.  Call wife's cell  [phone] or home [phone]

## 2015-12-12 NOTE — Telephone Encounter (Signed)
Called pt to f/u on how he did with his chemo yest.  He states that he feels well.  He denies any problems but knows he can call for any questions or concerns.

## 2015-12-13 ENCOUNTER — Ambulatory Visit: Payer: Managed Care, Other (non HMO)

## 2015-12-13 ENCOUNTER — Ambulatory Visit
Admission: RE | Admit: 2015-12-13 | Discharge: 2015-12-13 | Disposition: A | Discharge status: Home / Self Care | Payer: Managed Care, Other (non HMO) | Source: Ambulatory Visit | Attending: Radiation Oncology | Admitting: Radiation Oncology

## 2015-12-13 ENCOUNTER — Ambulatory Visit: Admission: RE | Admit: 2015-12-13 | Payer: Managed Care, Other (non HMO) | Source: Ambulatory Visit

## 2015-12-13 VITALS — BP 104/70 | HR 68 | Temp 98.8°F | Resp 18 | Ht 69.0 in | Wt 147.2 lb

## 2015-12-13 DIAGNOSIS — C154 Malignant neoplasm of middle third of esophagus: Secondary | ICD-10-CM

## 2015-12-13 NOTE — Progress Notes (Signed)
Juan [name] has received 4 fractions to his esophagus.  Skin to treatment field with normal color, using Sonafine bid.  Taking in lots of liquids to help him stay hydrated.  Denies having problems swallowing or pain while swallowing.  Appetite is good eating soft foods. Wt Readings from Last 3 Encounters:  12/13/15 147 lb 3.2 oz (66.8 kg)  12/11/15 169 lb 8 oz (76.9 kg)  12/05/15 173 lb (78.5 kg)  BP 104/70 (BP Location: Right Arm, Patient Position: Sitting, Cuff Size: Normal)   Pulse 68   Temp 98.8 F (37.1 C) (Oral)   Resp 18   Ht 5\' 9"  (1.753 m)   Wt 147 lb 3.2 oz (66.8 kg)   SpO2 100%   BMI 21.74 kg/m

## 2015-12-13 NOTE — Progress Notes (Signed)
Department of Radiation Oncology  Phone:  [phone] Fax:        [phone]  Weekly Treatment Note    Name: Juan [name] Date: 12/13/2015 MRN: [id number] DOB: 15-Feb-1941   Diagnosis:     ICD-9-CM ICD-10-CM   1. Malignant neoplasm of middle third of esophagus ([facility]) 150.4 C15.4      Current dose: 8 Gy  Current fraction: 4   MEDICATIONS: Current Outpatient Prescriptions  Medication Sig Dispense Refill  . amitriptyline (ELAVIL) 10 MG tablet Take 10 mg by mouth at bedtime. 11/16/15 - Take 1 tablet at Bedtime x 1 Week, then increase to 2 tablets Once Daily Orally- 30 days    . aspirin EC 81 MG tablet Take 81 mg by mouth daily.    Marland Kitchen atorvastatin (LIPITOR) 20 MG tablet Take 20 mg by mouth at bedtime.     . metFORMIN (GLUCOPHAGE) 500 MG tablet Take 500 mg by mouth daily with breakfast.    . omeprazole (PRILOSEC) 40 MG capsule Take 40 mg by mouth daily.     . ondansetron (ZOFRAN) 8 MG tablet Take 1 tablet (8 mg total) by mouth 2 (two) times daily as needed for refractory nausea / vomiting. Start on day 3 after chemo. 30 tablet 1  . oxyCODONE-acetaminophen (PERCOCET/ROXICET) 5-325 MG tablet Take 1-2 tablets by mouth every 6 (six) hours as needed for severe pain. 120 tablet 0  . prochlorperazine (COMPAZINE) 10 MG tablet Take 1 tablet (10 mg total) by mouth every 6 (six) hours as needed (Nausea or vomiting). 30 tablet 1  . Wound Dressings (SONAFINE) Apply 1 application topically daily.    Marland Kitchen albuterol (PROVENTIL HFA;VENTOLIN HFA) 108 (90 BASE) MCG/ACT inhaler Inhale 2 puffs into the lungs every 4 (four) hours as needed for wheezing. (Patient not taking: Reported on 12/13/2015) 1 Inhaler 0   No current facility-administered medications for this encounter.      ALLERGIES: Review of patient's allergies indicates no known allergies.   LABORATORY DATA:  Lab Results  Component Value Date   WBC 7.3 12/11/2015   HGB 12.9 (L) 12/11/2015   HCT 40.4 12/11/2015   MCV 72.4 (L) 12/11/2015   PLT 304 12/11/2015   Lab Results  Component Value Date   NA 137 12/11/2015   K 3.9 12/11/2015   CL 103 12/04/2015   CO2 24 12/11/2015   Lab Results  Component Value Date   ALT 13 12/11/2015   AST 13 12/11/2015   ALKPHOS 100 12/11/2015   BILITOT 0.53 12/11/2015     NARRATIVE: Juan [name] was seen today for weekly treatment management. The chart was checked and the patient's films were reviewed.  Juan [name] has received 4 fractions to his esophagus.  Skin to treatment field with normal color, using Sonafine bid.  Taking in lots of liquids to help him stay hydrated.  Denies having problems swallowing or pain while swallowing.  Appetite is good eating soft foods. Wt Readings from Last 3 Encounters:  12/13/15 147 lb 3.2 oz (66.8 kg)  12/11/15 169 lb 8 oz (76.9 kg)  12/05/15 173 lb (78.5 kg)  BP 104/70 (BP Location: Right Arm, Patient Position: Sitting, Cuff Size: Normal)   Pulse 68   Temp 98.8 F (37.1 C) (Oral)   Resp 18   Ht 5\' 9"  (1.753 m)   Wt 147 lb 3.2 oz (66.8 kg)   SpO2 100%   BMI 21.74 kg/m   PHYSICAL EXAMINATION: height is 5\' 9"  (1.753 m) and weight is 147 lb 3.2  oz (66.8 kg). His oral temperature is 98.8 F (37.1 C). His blood pressure is 104/70 and his pulse is 68. His respiration is 18 and oxygen saturation is 100%.        ASSESSMENT: The patient is doing satisfactorily with treatment.  PLAN: We will continue with the patient's radiation treatment as planned.

## 2015-12-16 ENCOUNTER — Ambulatory Visit: Payer: Managed Care, Other (non HMO)

## 2015-12-16 ENCOUNTER — Ambulatory Visit
Admission: RE | Admit: 2015-12-16 | Discharge: 2015-12-16 | Disposition: A | Discharge status: Home / Self Care | Payer: Managed Care, Other (non HMO) | Source: Ambulatory Visit | Attending: Radiation Oncology | Admitting: Radiation Oncology

## 2015-12-16 ENCOUNTER — Telehealth: Payer: Self-pay | Admitting: Hematology

## 2015-12-16 DIAGNOSIS — Z7984 Long term (current) use of oral hypoglycemic drugs: Secondary | ICD-10-CM | POA: Diagnosis not present

## 2015-12-16 DIAGNOSIS — R001 Bradycardia, unspecified: Secondary | ICD-10-CM | POA: Diagnosis not present

## 2015-12-16 DIAGNOSIS — Z800 Family history of malignant neoplasm of digestive organs: Secondary | ICD-10-CM | POA: Diagnosis not present

## 2015-12-16 DIAGNOSIS — K219 Gastro-esophageal reflux disease without esophagitis: Secondary | ICD-10-CM | POA: Diagnosis not present

## 2015-12-16 DIAGNOSIS — M546 Pain in thoracic spine: Secondary | ICD-10-CM | POA: Diagnosis not present

## 2015-12-16 DIAGNOSIS — R1310 Dysphagia, unspecified: Secondary | ICD-10-CM | POA: Diagnosis not present

## 2015-12-16 DIAGNOSIS — Z7982 Long term (current) use of aspirin: Secondary | ICD-10-CM | POA: Diagnosis not present

## 2015-12-16 DIAGNOSIS — C154 Malignant neoplasm of middle third of esophagus: Secondary | ICD-10-CM | POA: Diagnosis not present

## 2015-12-16 DIAGNOSIS — R634 Abnormal weight loss: Secondary | ICD-10-CM | POA: Diagnosis not present

## 2015-12-16 NOTE — Telephone Encounter (Signed)
No los 9/13. Referral entered to tcts 9/14. Referral message sent cindy at [facility] re scheduling appointment and contacting patient.

## 2015-12-17 ENCOUNTER — Ambulatory Visit: Payer: Managed Care, Other (non HMO)

## 2015-12-17 ENCOUNTER — Encounter: Payer: Managed Care, Other (non HMO) | Admitting: Nutrition

## 2015-12-17 ENCOUNTER — Ambulatory Visit
Admission: RE | Admit: 2015-12-17 | Discharge: 2015-12-17 | Disposition: A | Discharge status: Home / Self Care | Payer: Managed Care, Other (non HMO) | Source: Ambulatory Visit | Attending: Radiation Oncology | Admitting: Radiation Oncology

## 2015-12-17 DIAGNOSIS — Z7984 Long term (current) use of oral hypoglycemic drugs: Secondary | ICD-10-CM | POA: Diagnosis not present

## 2015-12-17 DIAGNOSIS — R6889 Other general symptoms and signs: Secondary | ICD-10-CM | POA: Diagnosis not present

## 2015-12-17 DIAGNOSIS — R634 Abnormal weight loss: Secondary | ICD-10-CM | POA: Diagnosis not present

## 2015-12-17 DIAGNOSIS — Z800 Family history of malignant neoplasm of digestive organs: Secondary | ICD-10-CM | POA: Diagnosis not present

## 2015-12-17 DIAGNOSIS — R1310 Dysphagia, unspecified: Secondary | ICD-10-CM | POA: Diagnosis not present

## 2015-12-17 DIAGNOSIS — Z7982 Long term (current) use of aspirin: Secondary | ICD-10-CM | POA: Diagnosis not present

## 2015-12-17 DIAGNOSIS — C154 Malignant neoplasm of middle third of esophagus: Secondary | ICD-10-CM | POA: Diagnosis not present

## 2015-12-17 DIAGNOSIS — M546 Pain in thoracic spine: Secondary | ICD-10-CM | POA: Diagnosis not present

## 2015-12-17 DIAGNOSIS — R001 Bradycardia, unspecified: Secondary | ICD-10-CM | POA: Diagnosis not present

## 2015-12-17 DIAGNOSIS — K219 Gastro-esophageal reflux disease without esophagitis: Secondary | ICD-10-CM | POA: Diagnosis not present

## 2015-12-18 ENCOUNTER — Ambulatory Visit: Payer: Managed Care, Other (non HMO) | Admitting: Nutrition

## 2015-12-18 ENCOUNTER — Ambulatory Visit (HOSPITAL_BASED_OUTPATIENT_CLINIC_OR_DEPARTMENT_OTHER): Payer: Managed Care, Other (non HMO) | Admitting: Hematology

## 2015-12-18 ENCOUNTER — Other Ambulatory Visit (HOSPITAL_BASED_OUTPATIENT_CLINIC_OR_DEPARTMENT_OTHER): Payer: Managed Care, Other (non HMO)

## 2015-12-18 ENCOUNTER — Ambulatory Visit
Admission: RE | Admit: 2015-12-18 | Discharge: 2015-12-18 | Disposition: A | Discharge status: Home / Self Care | Payer: Managed Care, Other (non HMO) | Source: Ambulatory Visit | Attending: Radiation Oncology | Admitting: Radiation Oncology

## 2015-12-18 ENCOUNTER — Ambulatory Visit: Payer: Managed Care, Other (non HMO)

## 2015-12-18 ENCOUNTER — Encounter: Payer: Self-pay | Admitting: *Deleted

## 2015-12-18 ENCOUNTER — Ambulatory Visit (HOSPITAL_BASED_OUTPATIENT_CLINIC_OR_DEPARTMENT_OTHER): Payer: Managed Care, Other (non HMO)

## 2015-12-18 ENCOUNTER — Encounter: Payer: Self-pay | Admitting: Hematology

## 2015-12-18 VITALS — BP 125/72 | HR 78 | Temp 97.9°F | Resp 18 | Ht 69.0 in | Wt 176.3 lb

## 2015-12-18 DIAGNOSIS — Z23 Encounter for immunization: Secondary | ICD-10-CM | POA: Diagnosis not present

## 2015-12-18 DIAGNOSIS — R6889 Other general symptoms and signs: Secondary | ICD-10-CM | POA: Diagnosis not present

## 2015-12-18 DIAGNOSIS — M546 Pain in thoracic spine: Secondary | ICD-10-CM | POA: Diagnosis not present

## 2015-12-18 DIAGNOSIS — Z800 Family history of malignant neoplasm of digestive organs: Secondary | ICD-10-CM | POA: Diagnosis not present

## 2015-12-18 DIAGNOSIS — C154 Malignant neoplasm of middle third of esophagus: Secondary | ICD-10-CM | POA: Diagnosis not present

## 2015-12-18 DIAGNOSIS — Z7982 Long term (current) use of aspirin: Secondary | ICD-10-CM | POA: Diagnosis not present

## 2015-12-18 DIAGNOSIS — Z7984 Long term (current) use of oral hypoglycemic drugs: Secondary | ICD-10-CM | POA: Diagnosis not present

## 2015-12-18 DIAGNOSIS — Z5111 Encounter for antineoplastic chemotherapy: Secondary | ICD-10-CM | POA: Diagnosis not present

## 2015-12-18 DIAGNOSIS — R001 Bradycardia, unspecified: Secondary | ICD-10-CM | POA: Diagnosis not present

## 2015-12-18 DIAGNOSIS — R634 Abnormal weight loss: Secondary | ICD-10-CM | POA: Diagnosis not present

## 2015-12-18 DIAGNOSIS — R1310 Dysphagia, unspecified: Secondary | ICD-10-CM | POA: Diagnosis not present

## 2015-12-18 DIAGNOSIS — K219 Gastro-esophageal reflux disease without esophagitis: Secondary | ICD-10-CM | POA: Diagnosis not present

## 2015-12-18 LAB — CBC WITH DIFFERENTIAL/PLATELET
BASO%: 0.2 % (ref 0.0–2.0)
Basophils Absolute: 0 10*3/uL (ref 0.0–0.1)
EOS%: 3.3 % (ref 0.0–7.0)
Eosinophils Absolute: 0.2 10*3/uL (ref 0.0–0.5)
HCT: 38 % — ABNORMAL LOW (ref 38.4–49.9)
HGB: 12.2 g/dL — ABNORMAL LOW (ref 13.0–17.1)
LYMPH%: 15.7 % (ref 14.0–49.0)
MCH: 23.4 pg — ABNORMAL LOW (ref 27.2–33.4)
MCHC: 32.1 g/dL (ref 32.0–36.0)
MCV: 72.8 fL — ABNORMAL LOW (ref 79.3–98.0)
MONO#: 0.5 10*3/uL (ref 0.1–0.9)
MONO%: 11.7 % (ref 0.0–14.0)
NEUT#: 3.2 10*3/uL (ref 1.5–6.5)
NEUT%: 69.1 % (ref 39.0–75.0)
Platelets: 265 10*3/uL (ref 140–400)
RBC: 5.22 10*6/uL (ref 4.20–5.82)
RDW: 14.5 % (ref 11.0–14.6)
WBC: 4.6 10*3/uL (ref 4.0–10.3)
lymph#: 0.7 10*3/uL — ABNORMAL LOW (ref 0.9–3.3)

## 2015-12-18 LAB — COMPREHENSIVE METABOLIC PANEL
ALT: 14 U/L (ref 0–55)
AST: 12 U/L (ref 5–34)
Albumin: 3.3 g/dL — ABNORMAL LOW (ref 3.5–5.0)
Alkaline Phosphatase: 95 U/L (ref 40–150)
Anion Gap: 11 mEq/L (ref 3–11)
BUN: 8.7 mg/dL (ref 7.0–26.0)
CO2: 25 mEq/L (ref 22–29)
Calcium: 9.6 mg/dL (ref 8.4–10.4)
Chloride: 104 mEq/L (ref 98–109)
Creatinine: 1 mg/dL (ref 0.7–1.3)
EGFR: 89 mL/min/{1.73_m2} — ABNORMAL LOW (ref >90)
Glucose: 118 mg/dl (ref 70–140)
Potassium: 4 mEq/L (ref 3.5–5.1)
Sodium: 140 mEq/L (ref 136–145)
Total Bilirubin: 0.48 mg/dL (ref 0.20–1.20)
Total Protein: 7.4 g/dL (ref 6.4–8.3)

## 2015-12-18 MED ORDER — DIPHENHYDRAMINE HCL 50 MG/ML IJ SOLN
50.0000 mg | Freq: Once | INTRAMUSCULAR | Status: AC
  Administered 2015-12-18: 50 mg via INTRAVENOUS

## 2015-12-18 MED ORDER — FAMOTIDINE IN NACL 20-0.9 MG/50ML-% IV SOLN
INTRAVENOUS | Status: AC
  Filled 2015-12-18: qty 50

## 2015-12-18 MED ORDER — DIPHENHYDRAMINE HCL 50 MG/ML IJ SOLN
INTRAMUSCULAR | Status: AC
  Filled 2015-12-18: qty 1

## 2015-12-18 MED ORDER — PALONOSETRON HCL INJECTION 0.25 MG/5ML
INTRAVENOUS | Status: AC
  Filled 2015-12-18: qty 5

## 2015-12-18 MED ORDER — FAMOTIDINE IN NACL 20-0.9 MG/50ML-% IV SOLN
20.0000 mg | Freq: Once | INTRAVENOUS | Status: AC
  Administered 2015-12-18: 20 mg via INTRAVENOUS

## 2015-12-18 MED ORDER — PACLITAXEL CHEMO INJECTION 300 MG/50ML
45.0000 mg/m2 | Freq: Once | INTRAVENOUS | Status: AC
  Administered 2015-12-18: 90 mg via INTRAVENOUS
  Filled 2015-12-18: qty 15

## 2015-12-18 MED ORDER — PALONOSETRON HCL INJECTION 0.25 MG/5ML
0.2500 mg | Freq: Once | INTRAVENOUS | Status: AC
  Administered 2015-12-18: 0.25 mg via INTRAVENOUS

## 2015-12-18 MED ORDER — SODIUM CHLORIDE 0.9 % IV SOLN
193.4000 mg | Freq: Once | INTRAVENOUS | Status: AC
  Administered 2015-12-18: 190 mg via INTRAVENOUS
  Filled 2015-12-18: qty 19

## 2015-12-18 MED ORDER — INFLUENZA VAC SPLIT QUAD 0.5 ML IM SUSY
0.5000 mL | PREFILLED_SYRINGE | Freq: Once | INTRAMUSCULAR | Status: AC
  Administered 2015-12-18: 0.5 mL via INTRAMUSCULAR
  Filled 2015-12-18: qty 0.5

## 2015-12-18 MED ORDER — SODIUM CHLORIDE 0.9 % IV SOLN
Freq: Once | INTRAVENOUS | Status: AC
  Administered 2015-12-18: 10:00:00 via INTRAVENOUS

## 2015-12-18 MED ORDER — SODIUM CHLORIDE 0.9 % IV SOLN
10.0000 mg | Freq: Once | INTRAVENOUS | Status: AC
  Administered 2015-12-18: 10 mg via INTRAVENOUS
  Filled 2015-12-18: qty 1

## 2015-12-18 NOTE — Progress Notes (Signed)
Oncology Nurse Navigator Documentation  Oncology Nurse Navigator Flowsheets 12/18/2015  Navigator Location [facility]  Navigator Encounter Type Treatment  Telephone -  Abnormal Finding Date -  Confirmed Diagnosis Date -  Treatment Initiated Date -  Patient Visit Type MedOnc  Treatment Phase Active Tx  Barriers/Navigation Needs Coordination of Care--surgery referral (ordered 9/14)  Education -  Interventions Coordination of Care  Referrals -  Coordination of Care Appts--made patient aware of his appointment on 12/20/15 at 10:30 with Dr. Servando Snare. Wrote appointment with address and phone of office and gave to him in tx area.  Education Method -  Support Groups/Services -  Acuity -  Time Spent with Patient -

## 2015-12-18 NOTE — Progress Notes (Signed)
error 

## 2015-12-18 NOTE — Progress Notes (Signed)
Nutrition follow-up completed with patient receiving chemotherapy for esophageal cancer. Weight improved documented as 176.3 pounds September 20 increased from 173 pounds September 7. Patient reports he is tolerating soft foods and oral nutrition supplements. Patient denies other nutrition impact symptoms.  Nutrition diagnosis:  Unintended weight loss improved.  Intervention: Educated patient to continue strategies for increased calories and protein. Provided additional coupons for patient. Teach back method used.  Monitoring, evaluation, goals:  Patient will tolerate adequate calories and protein to minimize weight loss throughout treatment.  Next visit: Wednesday, October 4, during infusion.  **Disclaimer: This note was dictated with voice recognition software. Similar sounding words can inadvertently be transcribed and this note may contain transcription errors which may not have been corrected upon publication of note.**

## 2015-12-18 NOTE — Patient Instructions (Addendum)
[place] Discharge Instructions for Patients Receiving Chemotherapy  Today you received the following chemotherapy agents Taxol and Carboplatin  To help prevent nausea and vomiting after your treatment, we encourage you to take your nausea medication as directed.   If you develop nausea and vomiting that is not controlled by your nausea medication, call the clinic.   BELOW ARE SYMPTOMS THAT SHOULD BE REPORTED IMMEDIATELY:  *FEVER GREATER THAN 100.5 F  *CHILLS WITH OR WITHOUT FEVER  NAUSEA AND VOMITING THAT IS NOT CONTROLLED WITH YOUR NAUSEA MEDICATION  *UNUSUAL SHORTNESS OF BREATH  *UNUSUAL BRUISING OR BLEEDING  TENDERNESS IN MOUTH AND THROAT WITH OR WITHOUT PRESENCE OF ULCERS  *URINARY PROBLEMS  *BOWEL PROBLEMS  UNUSUAL RASH Items with * indicate a potential emergency and should be followed up as soon as possible.  Feel free to call the clinic you have any questions or concerns. The clinic phone number is [phone].  Please show the [employer] at check-in to the Emergency Department and triage nurse.    Implanted [facility] Guide An implanted port is a type of central line that is placed under the skin. Central lines are used to provide IV access when treatment or nutrition needs to be given through a person's veins. Implanted ports are used for long-term IV access. An implanted port may be placed because:   You need IV medicine that would be irritating to the small veins in your hands or arms.   You need long-term IV medicines, such as antibiotics.   You need IV nutrition for a long period.   You need frequent blood draws for lab tests.   You need dialysis.  Implanted ports are usually placed in the chest area, but they can also be placed in the upper arm, the abdomen, or the leg. An implanted port has two main parts:   Reservoir. The reservoir is round and will appear as a small, raised area under your skin. The reservoir is the  part where a needle is inserted to give medicines or draw blood.   Catheter. The catheter is a thin, flexible tube that extends from the reservoir. The catheter is placed into a large vein. Medicine that is inserted into the reservoir goes into the catheter and then into the vein.  HOW WILL I CARE FOR MY INCISION SITE? Do not get the incision site wet. Bathe or shower as directed by your health care provider.  HOW IS MY PORT ACCESSED? Special steps must be taken to access the port:   Before the port is accessed, a numbing cream can be placed on the skin. This helps numb the skin over the port site.   Your health care provider uses a sterile technique to access the port.  Your health care provider must put on a mask and sterile gloves.  The skin over your port is cleaned carefully with an antiseptic and allowed to dry.  The port is gently pinched between sterile gloves, and a needle is inserted into the port.  Only "non-coring" port needles should be used to access the port. Once the port is accessed, a blood return should be checked. This helps ensure that the port is in the vein and is not clogged.   If your port needs to remain accessed for a constant infusion, a clear (transparent) bandage will be placed over the needle site. The bandage and needle will need to be changed every week, or as directed by your health care provider.  Keep the bandage covering the needle clean and dry. Do not get it wet. Follow your health care provider's instructions on how to take a shower or bath while the port is accessed.   If your port does not need to stay accessed, no bandage is needed over the port.  WHAT IS FLUSHING? Flushing helps keep the port from getting clogged. Follow your health care provider's instructions on how and when to flush the port. Ports are usually flushed with saline solution or a medicine called heparin. The need for flushing will depend on how the port is used.   If the  port is used for intermittent medicines or blood draws, the port will need to be flushed:   After medicines have been given.   After blood has been drawn.   As part of routine maintenance.   If a constant infusion is running, the port may not need to be flushed.  HOW LONG WILL MY PORT STAY IMPLANTED? The port can stay in for as long as your health care provider thinks it is needed. When it is time for the port to come out, surgery will be done to remove it. The procedure is similar to the one performed when the port was put in.  WHEN SHOULD I SEEK IMMEDIATE MEDICAL CARE? When you have an implanted port, you should seek immediate medical care if:   You notice a bad smell coming from the incision site.   You have swelling, redness, or drainage at the incision site.   You have more swelling or pain at the port site or the surrounding area.   You have a fever that is not controlled with medicine.   This information is not intended to replace advice given to you by your health care provider. Make sure you discuss any questions you have with your health care provider.   Document Released: 03/16/2005 Document Revised: 01/04/2013 Document Reviewed: 11/21/2012 Elsevier Interactive Patient Education [employer].

## 2015-12-18 NOTE — Progress Notes (Signed)
Juan [name]  [phone] [phone]) [phone]  Clinic follow Up Note   Patient Care Team: Juan [name]) Juan Heck, MD (Inactive) as PCP - General (Internal Medicine) 12/18/2015   CHIEF COMPLAINTS:  Follow up esophageal squamous cell carcinoma  Oncology History   Presented with dysphagia and odynophagia with 20-25 pounds of weight loss in about 3-4 months  Esophageal cancer ([place])   Staging form: Esophagus - Adenocarcinoma, AJCC 7th Edition   - Clinical stage from 11/13/2015: Stage IIIB (T3, N2, M0, G2) - Signed by Juan Merle, MD on 12/11/2015      Esophageal cancer ([place])   11/13/2015 Procedure    UPPER ENDOSCOPY per Juan [name]: Large fungating, friable bleeding mass in middle third of esophagus, 22cm from incisors and extended to 27cm. Non-obstructing.      11/13/2015 Pathology Results    Invasive squamous cell carcinoma; moderately differentiated      11/13/2015 Imaging    CT ABD/PELVIS: IMPRESSION:No evidence of metastatic disease in the abdomen or pelvis. 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 Aortoiliac atherosclerosis. Small bilateral inguinal hernias containing fat the      11/22/2015 Initial Diagnosis    Esophageal cancer ([place])     12/03/2015 Imaging    PET scan showed a long segment of hypermetabolic thickening in the mid esophagus consistent with esophageal carcinoma. No clear evidence of pedis tunnel node metastasis or distant metastasis.      12/10/2015 -  Radiation Therapy    Neoadjuvant radiation to his esophageal cancer      12/10/2015 -  Chemotherapy    Neoadjuvant weekly carboplatin AUC 2, and Taxol 45 mg/m, with concurrent radiation       HISTORY OF PRESENTING ILLNESS:  Juan [name] 75 y.o. male is here because of His recently diagnosed esophageal cancer. He is accompanied by his wife to my clinic today.  He started having sore throat and dysphagia about 4-5 months ago, and was seen by PCP, had 2 course antibiotics but is  symptom progressed. He was referred to gastroenterologist Dr. Suezanne [name], and underwent EGD on 11/13/2015, which showed a large fungating, friable bleeding mass in the middle third of esophagus, 22 cm to 27 cm from incisors, non-obstructing. The biopsy showed squamous cell carcinoma. His CT abdomen and pelvis was negative for metastatic disease.  He has lost 20lbs, he is on soft diet and liquids, his pain is about 7/10 pain with swallowing. He also has chest pain , intermittent, it lasts about a few minutes, no nausea or vomiting, his bowel movement is normal. He denies melena or hematochezia. He is able to function and tolerated light activities at home.  CURRENT THERAPY: concurrent chemotherapy and radiation, with weekly carboplatin AUC 2 and Taxol 45 mg/m, started on 12/10/2015  INTERIM HISTORY: Juan [name] returns for follow-up and second week of chemotherapy and irradiation. He tolerated the first cycle chemotherapy very well last week, no significant nausea, diarrhea, or other side effects. He actually has been eating better, with snacks, ensure 3-4 bottles today, and he gained about 7 pounds in the past one week. He has a mild odynophagia, no significant chest pain when he doesn't eat, no other new complaints.  MEDICAL HISTORY:  Past Medical History:  Diagnosis Date  . Esophageal cancer ([place])   . Reflux     SURGICAL HISTORY: Past Surgical History:  Procedure Laterality Date  . EUS N/A 12/05/2015   Procedure: UPPER ENDOSCOPIC ULTRASOUND (EUS) LINEAR;  Surgeon: Juan Ada, MD;  Location: [facility] END[facility]COPY;  Service: Endoscopy;  Laterality: N/A;  . HERNIA REPAIR    . SHOULDER ARTHROSCOPY W/ SUPERIOR LABRAL ANTERIOR POSTERIOR LESION REPAIR      SOCIAL HISTORY: Social History   Social History  . Marital status: Married    Spouse name: N/A  . Number of children: N/A  . Years of education: N/A   Occupational History  . Not on file.   Social History Main Topics  . Smoking status: Former  Smoker    Packs/day: 2.00    Years: 40.00    Quit date: 03/31/1999  . Smokeless tobacco: Never Used  . Alcohol use No     Comment: weekend binge drinking for 30-40 years, quit in 2001  . Drug use: No  . Sexual activity: Not on file   Other Topics Concern  . 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 Colon cancer Father 56  . Colon cancer Brother 25    ALLERGIES:  has No Known Allergies.  MEDICATIONS:  Current Outpatient Prescriptions  Medication Sig Dispense Refill  . albuterol (PROVENTIL HFA;VENTOLIN HFA) 108 (90 BASE) MCG/ACT inhaler Inhale 2 puffs into the lungs every 4 (four) hours as needed for wheezing. 1 Inhaler 0  . amitriptyline (ELAVIL) 10 MG tablet Take 10 mg by mouth at bedtime. 11/16/15 - Take 1 tablet at Bedtime x 1 Week, then increase to 2 tablets Once Daily Orally- 30 days    . aspirin EC 81 MG tablet Take 81 mg by mouth daily.    Marland Kitchen atorvastatin (LIPITOR) 20 MG tablet Take 20 mg by mouth at bedtime.     . metFORMIN (GLUCOPHAGE) 500 MG tablet Take 500 mg by mouth daily with breakfast.    . omeprazole (PRILOSEC) 40 MG capsule Take 40 mg by mouth daily.     . ondansetron (ZOFRAN) 8 MG tablet Take 1 tablet (8 mg total) by mouth 2 (two) times daily as needed for refractory nausea / vomiting. Start on day 3 after chemo. 30 tablet 1  . oxyCODONE-acetaminophen (PERCOCET/ROXICET) 5-325 MG tablet Take 1-2 tablets by mouth every 6 (six) hours as needed for severe pain. 120 tablet 0  . prochlorperazine (COMPAZINE) 10 MG tablet Take 1 tablet (10 mg total) by mouth every 6 (six) hours as needed (Nausea or vomiting). 30 tablet 1  . Wound Dressings (SONAFINE) Apply 1 application topically daily.     No current facility-administered  medications for this visit.     REVIEW OF SYSTEMS:   Constitutional: Denies fevers, chills or abnormal night sweats Eyes: Denies blurriness of vision, double vision or watery eyes Ears, nose, mouth, throat, and face: Denies mucositis or sore throat Respiratory: Denies cough, dyspnea or wheezes Cardiovascular: Denies palpitation, chest discomfort or lower extremity swelling Gastrointestinal:  Denies nausea, heartburn or change in bowel habits Skin: Denies abnormal skin rashes Lymphatics: Denies new lymphadenopathy or easy bruising Neurological:Denies numbness, tingling or new weaknesses Behavioral/Psych: Mood is stable, no new changes  All other systems were reviewed with the patient and are negative.  PHYSICAL EXAMINATION: ECOG PERFORMANCE STATUS: 1 - Symptomatic but completely ambulatory  Vitals:   12/18/15 0842  BP: 125/72  Pulse: 78  Resp: 18  Temp: 97.9 F (36.6 C)   Filed  Weights   12/18/15 0842  Weight: 176 lb 4.8 oz (80 kg)    GENERAL:alert, no distress and comfortable SKIN: skin color, texture, turgor are normal, no rashes or significant lesions EYES: normal, conjunctiva are pink and non-injected, sclera clear OROPHARYNX:no exudate, no erythema and lips, buccal mucosa, and tongue normal  NECK: supple, thyroid normal size, non-tender, without nodularity LYMPH:  no palpable lymphadenopathy in the cervical, axillary or inguinal LUNGS: clear to auscultation and percussion with normal breathing effort HEART: regular rate & rhythm and no murmurs and no lower extremity edema ABDOMEN:abdomen soft, non-tender and normal bowel sounds Musculoskeletal:no cyanosis of digits and no clubbing  PSYCH: alert & oriented x 3 with fluent speech NEURO: no focal motor/sensory deficits  LABORATORY DATA:  I have reviewed the data as listed CBC Latest Ref Rng & Units 12/18/2015 12/11/2015 12/04/2015  WBC 4.0 - 10.3 10e3/uL 4.6 7.3 -  Hemoglobin 13.0 - 17.1 g/dL 12.2(L) 12.9(L) 14.6    Hematocrit 38.4 - 49.9 % 38.0(L) 40.4 43.0  Platelets 140 - 400 10e3/uL 265 304 -   CMP Latest Ref Rng & Units 12/18/2015 12/11/2015 12/04/2015  Glucose 70 - 140 mg/dl 118 117 104(H)  BUN 7.0 - 26.0 mg/dL 8.7 10.9 16  Creatinine 0.7 - 1.3 mg/dL 1.0 0.8 0.70  Sodium 136 - 145 mEq/L 140 137 137  Potassium 3.5 - 5.1 mEq/L 4.0 3.9 4.6  Chloride 101 - 111 mmol/L - - 103  CO2 22 - 29 mEq/L 25 24 23   Calcium 8.4 - 10.4 mg/dL 9.6 9.8 9.5  Total Protein 6.4 - 8.3 g/dL 7.4 8.0 7.6  Total Bilirubin 0.20 - 1.20 mg/dL 0.48 0.53 0.32  Alkaline Phos 40 - 150 U/L 95 100 91  AST 5 - 34 U/L 12 13 15   ALT 0 - 55 U/L 14 13 10    PATHOLOGY REPORT  Final microscopic diagnosis Esophagus, 0.2 cm to 27 cm, biopsy -Invasive squamous cell carcinoma, moderately differentiated   RADIOGRAPHIC STUDIES: I have personally reviewed the radiological images as listed and agreed with the findings in the report. Dg Chest 2 View  Result Date: 12/04/2015 CLINICAL DATA:  75 year old male with intermittent midsternal chest pain. Recent diagnosis of esophageal cancer. EXAM: CHEST  2 VIEW COMPARISON:  PETCT dated 12/03/2015 FINDINGS: The lungs are clear. There is no pleural effusion or pneumothorax. The cardiac silhouette is within normal limits. No acute osseous pathology. IMPRESSION: No active cardiopulmonary disease. Electronically Signed   By: Anner Crete M.D.   On: 12/04/2015 23:51   Nm Pet Image Initial (pi) Skull Base To Thigh  Result Date: 12/03/2015 CLINICAL DATA:  Initial treatment strategy for esophageal cancer of the middle third of the esophagus. EXAM: NUCLEAR MEDICINE PET SKULL BASE TO THIGH TECHNIQUE: 12.7 mCi F-18 FDG was injected intravenously. Full-ring PET imaging was performed from the skull base to thigh after the radiotracer. CT data was obtained and used for attenuation correction and anatomic localization. FASTING BLOOD GLUCOSE:  Value: 11 mg/dl COMPARISON:  CT abdomen 11/13/2015 FINDINGS: NECK No  hypermetabolic lymph nodes in the neck. CHEST A 3 cm segment of circumferential esophageal wall thickening at the level of the carina with intense metabolic activity (SUV max equal 18.2). Single wall thickness measures 1.5 cm High RIGHT paratracheal lymph node measures 7 mm (image 964, series 4) with low metabolic activity the hypermetabolic activity (SUV max equaled 2.1). No hypermetabolic paraesophageal lymph nodes. No suspicious pulmonary nodules. ABDOMEN/PELVIS No hypermetabolic gastrohepatic ligament nodes. No abnormal metabolic activity in the  liver. No hypermetabolic retroperitoneal or pelvic lymph nodes. Physiologic activity noted within the colon. SKELETON No focal hypermetabolic activity to suggest skeletal metastasis. IMPRESSION: 1. Long segment of hypermetabolic thickening in the mid esophagus consistent with esophageal carcinoma. 2. No clear evidence of mediastinal nodal metastasis. Single high RIGHT paratracheal lymph node with low metabolic activity 3. No evidence of liver metastasis or upper abdominal nodal metastasis Electronically Signed   By: Suzy Bouchard M.D.   On: 12/03/2015 10:53   EGD 11/13/2015 Juan [name]  -A large, fungating, friable mass with bleeding was found in the mid third of the esophagus, 22 cm from the incisors and extended to 27 cm. The mass was nonobstructing and a circumferential, biopsy was taken. -Normal stomach -Normal proximal small bowel  Colonoscopy 11/13/2015 Juan [name] -Diverticulosis in the entire exam: -No specimens collected  EUS 12/05/2015 Malignant esophageal tumor was found in the upper third of the esophagus and in the middle third of the esophagus. - A mass was found in the cervical esophagus and in the thoracic esophagus. The diagnosis is squamous cell carcinoma. This was staged T3 N1/N2 Mx by endosonographic criteria. - No specimens collected.   ASSESSMENT & PLAN: 75 year old gentleman, without significant past medical history except acid reflux,  presented with progressive dysphagia and odynophagia and weight loss.  1. Esophageal cancer, squamous cell carcinoma, cT3N1-2M0, stage IIIB -I reviewed his EGD findings, biopsy results, and the CT scan findings with patient and his wife in great details -I dicussed staging PET scan findings with patient and his wife, the mid esophageal mass is hypermetabolic, no distant metastasis. -EUS revealed a T3 lesion, N1 vs N2, locally advanced disease. -We recommend him to have new adjuvant radiation and chemotherapy with weekly carboplatin and Taxol -he is prediabetic, I'll reduce his premedication dexamethasone to 10 mg, and watch his blood glucose closely  -we will refer him to Dr. Servando Snare for surgical evaluation. -Weekly follow-up and lab during his chemoradiation  2. Dysphagia -He is able to tolerate a soft diet and drinks fluids adequately -follow up with dietician Pamala Hurry  -He has gained weight since he started chemo last week   3. Odynophagia -Dr. Lisbeth Renshaw has prescribed Percocet as needed for his pain, he will continue , he does not use much -I will adjust his pain medication if needed during his treatment   Recommendations -Lab results reviewed, adequate for treatment, we'll proceed second week chemotherapy carbo and Taxol today -He will see APP Lattie Haw next week, I'll see him back in 2 weeks  All questions were answered. The patient knows to call the clinic with any problems, questions or concerns.  I spent 20 minutes counseling the patient face to face. The total time spent in the appointment was 25 minutes and more than 50% was on counseling.     Juan Merle, MD 12/18/2015 9:06 AM

## 2015-12-18 NOTE — Addendum Note (Signed)
Addended by: [name] on: 12/18/2015 11:20 AM   Modules accepted: Orders

## 2015-12-19 ENCOUNTER — Telehealth: Payer: Self-pay | Admitting: *Deleted

## 2015-12-19 ENCOUNTER — Ambulatory Visit: Payer: Managed Care, Other (non HMO)

## 2015-12-19 ENCOUNTER — Emergency Department (HOSPITAL_COMMUNITY)
Admission: EM | Admit: 2015-12-19 | Discharge: 2015-12-20 | Disposition: A | Discharge status: Home / Self Care | Payer: Managed Care, Other (non HMO) | Attending: Emergency Medicine | Admitting: Emergency Medicine

## 2015-12-19 ENCOUNTER — Emergency Department (HOSPITAL_COMMUNITY): Payer: Managed Care, Other (non HMO)

## 2015-12-19 ENCOUNTER — Telehealth: Payer: Self-pay

## 2015-12-19 ENCOUNTER — Encounter (HOSPITAL_COMMUNITY): Payer: Self-pay | Admitting: Emergency Medicine

## 2015-12-19 ENCOUNTER — Ambulatory Visit
Admission: RE | Admit: 2015-12-19 | Discharge: 2015-12-19 | Disposition: A | Discharge status: Home / Self Care | Payer: Managed Care, Other (non HMO) | Source: Ambulatory Visit | Attending: Radiation Oncology | Admitting: Radiation Oncology

## 2015-12-19 DIAGNOSIS — R0789 Other chest pain: Secondary | ICD-10-CM | POA: Insufficient documentation

## 2015-12-19 DIAGNOSIS — Z8501 Personal history of malignant neoplasm of esophagus: Secondary | ICD-10-CM | POA: Diagnosis not present

## 2015-12-19 DIAGNOSIS — R454 Irritability and anger: Secondary | ICD-10-CM

## 2015-12-19 DIAGNOSIS — F4325 Adjustment disorder with mixed disturbance of emotions and conduct: Secondary | ICD-10-CM | POA: Insufficient documentation

## 2015-12-19 DIAGNOSIS — Z7982 Long term (current) use of aspirin: Secondary | ICD-10-CM | POA: Insufficient documentation

## 2015-12-19 DIAGNOSIS — Z7984 Long term (current) use of oral hypoglycemic drugs: Secondary | ICD-10-CM | POA: Diagnosis not present

## 2015-12-19 DIAGNOSIS — Z87891 Personal history of nicotine dependence: Secondary | ICD-10-CM | POA: Diagnosis not present

## 2015-12-19 DIAGNOSIS — R001 Bradycardia, unspecified: Secondary | ICD-10-CM | POA: Diagnosis not present

## 2015-12-19 DIAGNOSIS — Z79899 Other long term (current) drug therapy: Secondary | ICD-10-CM | POA: Insufficient documentation

## 2015-12-19 DIAGNOSIS — C154 Malignant neoplasm of middle third of esophagus: Secondary | ICD-10-CM | POA: Diagnosis not present

## 2015-12-19 DIAGNOSIS — R4182 Altered mental status, unspecified: Secondary | ICD-10-CM | POA: Diagnosis present

## 2015-12-19 DIAGNOSIS — M546 Pain in thoracic spine: Secondary | ICD-10-CM | POA: Diagnosis not present

## 2015-12-19 DIAGNOSIS — R634 Abnormal weight loss: Secondary | ICD-10-CM | POA: Diagnosis not present

## 2015-12-19 DIAGNOSIS — G479 Sleep disorder, unspecified: Secondary | ICD-10-CM | POA: Diagnosis not present

## 2015-12-19 DIAGNOSIS — K219 Gastro-esophageal reflux disease without esophagitis: Secondary | ICD-10-CM | POA: Diagnosis not present

## 2015-12-19 DIAGNOSIS — R1310 Dysphagia, unspecified: Secondary | ICD-10-CM | POA: Diagnosis not present

## 2015-12-19 DIAGNOSIS — Z800 Family history of malignant neoplasm of digestive organs: Secondary | ICD-10-CM | POA: Diagnosis not present

## 2015-12-19 LAB — COMPREHENSIVE METABOLIC PANEL
ALT: 21 U/L (ref 17–63)
AST: 20 U/L (ref 15–41)
Albumin: 4.2 g/dL (ref 3.5–5.0)
Alkaline Phosphatase: 78 U/L (ref 38–126)
Anion gap: 8 (ref 5–15)
BUN: 11 mg/dL (ref 6–20)
CO2: 26 mmol/L (ref 22–32)
Calcium: 9.6 mg/dL (ref 8.9–10.3)
Chloride: 104 mmol/L (ref 101–111)
Creatinine, Ser: 0.75 mg/dL (ref 0.61–1.24)
GFR calc Af Amer: >60 mL/min (ref >60)
GFR calc non Af Amer: >60 mL/min (ref >60)
Glucose, Bld: 99 mg/dL (ref 65–99)
Potassium: 4.4 mmol/L (ref 3.5–5.1)
Sodium: 138 mmol/L (ref 135–145)
Total Bilirubin: 0.4 mg/dL (ref 0.3–1.2)
Total Protein: 8.3 g/dL — ABNORMAL HIGH (ref 6.5–8.1)

## 2015-12-19 LAB — CBC WITH DIFFERENTIAL/PLATELET
Basophils Absolute: 0 10*3/uL (ref 0.0–0.1)
Basophils Relative: 0 %
Eosinophils Absolute: 0 10*3/uL (ref 0.0–0.7)
Eosinophils Relative: 0 %
HCT: 36.3 % — ABNORMAL LOW (ref 39.0–52.0)
Hemoglobin: 11.5 g/dL — ABNORMAL LOW (ref 13.0–17.0)
Lymphocytes Relative: 7 %
Lymphs Abs: 0.5 10*3/uL — ABNORMAL LOW (ref 0.7–4.0)
MCH: 23 pg — ABNORMAL LOW (ref 26.0–34.0)
MCHC: 31.7 g/dL (ref 30.0–36.0)
MCV: 72.6 fL — ABNORMAL LOW (ref 78.0–100.0)
Monocytes Absolute: 0.5 10*3/uL (ref 0.1–1.0)
Monocytes Relative: 7 %
Neutro Abs: 6.3 10*3/uL (ref 1.7–7.7)
Neutrophils Relative %: 86 %
Platelets: 332 10*3/uL (ref 150–400)
RBC: 5 MIL/uL (ref 4.22–5.81)
RDW: 14.7 % (ref 11.5–15.5)
WBC: 7.3 10*3/uL (ref 4.0–10.5)

## 2015-12-19 LAB — URINALYSIS, ROUTINE W REFLEX MICROSCOPIC
Bilirubin Urine: NEGATIVE
Glucose, UA: NEGATIVE mg/dL
Hgb urine dipstick: NEGATIVE
Ketones, ur: NEGATIVE mg/dL
Leukocytes, UA: NEGATIVE
Nitrite: NEGATIVE
Protein, ur: NEGATIVE mg/dL
Specific Gravity, Urine: 1.027 (ref 1.005–1.030)
pH: 5 (ref 5.0–8.0)

## 2015-12-19 LAB — RAPID URINE DRUG SCREEN, HOSP PERFORMED
Amphetamines: NOT DETECTED
Barbiturates: NOT DETECTED
Benzodiazepines: NOT DETECTED
Cocaine: NOT DETECTED
Opiates: NOT DETECTED
Tetrahydrocannabinol: NOT DETECTED

## 2015-12-19 LAB — AMMONIA: Ammonia: 21 umol/L (ref 9–35)

## 2015-12-19 LAB — ETHANOL: Alcohol, Ethyl (B): <5 mg/dL (ref <5)

## 2015-12-19 MED ORDER — AMITRIPTYLINE HCL 10 MG PO TABS
10.0000 mg | ORAL_TABLET | Freq: Every day | ORAL | Status: DC
  Administered 2015-12-19: 10 mg via ORAL
  Filled 2015-12-19: qty 1

## 2015-12-19 MED ORDER — ATORVASTATIN CALCIUM 20 MG PO TABS
20.0000 mg | ORAL_TABLET | Freq: Every day | ORAL | Status: DC
  Administered 2015-12-19: 20 mg via ORAL
  Filled 2015-12-19: qty 1

## 2015-12-19 MED ORDER — ALBUTEROL SULFATE HFA 108 (90 BASE) MCG/ACT IN AERS
2.0000 | INHALATION_SPRAY | RESPIRATORY_TRACT | Status: DC | PRN
Start: 2015-12-19 — End: 2015-12-20
  Filled 2015-12-19: qty 6.7

## 2015-12-19 MED ORDER — PANTOPRAZOLE SODIUM 40 MG PO TBEC
40.0000 mg | DELAYED_RELEASE_TABLET | Freq: Every day | ORAL | Status: DC
  Administered 2015-12-19 – 2015-12-20 (×2): 40 mg via ORAL
  Filled 2015-12-19 (×2): qty 1

## 2015-12-19 MED ORDER — LORAZEPAM 1 MG PO TABS: 1.0000 mg | ORAL_TABLET | Freq: Every evening | ORAL | Status: DC | PRN

## 2015-12-19 MED ORDER — ASPIRIN EC 81 MG PO TBEC
81.0000 mg | DELAYED_RELEASE_TABLET | Freq: Every day | ORAL | Status: DC
  Administered 2015-12-20: 81 mg via ORAL
  Filled 2015-12-19: qty 1

## 2015-12-19 MED ORDER — METFORMIN HCL 500 MG PO TABS
500.0000 mg | ORAL_TABLET | Freq: Two times a day (BID) | ORAL | Status: DC
Start: 2015-12-19 — End: 2015-12-20
  Administered 2015-12-19 – 2015-12-20 (×2): 500 mg via ORAL
  Filled 2015-12-19 (×2): qty 1

## 2015-12-19 MED ORDER — ONDANSETRON HCL 4 MG PO TABS
8.0000 mg | ORAL_TABLET | Freq: Two times a day (BID) | ORAL | Status: DC | PRN
Start: 2015-12-19 — End: 2015-12-20

## 2015-12-19 NOTE — Telephone Encounter (Signed)
Called pt to let him know fmla paperwork was ready at front desk for pick up
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
## 2015-12-19 NOTE — ED Notes (Signed)
Bed: WTR8 Expected date:  Expected time:  Means of arrival:  Comments: EMS-chemo induced psychosis
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
## 2015-12-19 NOTE — Telephone Encounter (Signed)
This nurse called back to patient's home to make sure wife called 911 due to patients behavior and threats. Wife stated,'I called 24 and they are here." I instructed wife to go in the house, while law enforcement was with her, and let me talk with the patient. Patient agreed to ride in the ambulance and come to [facility] ER to be seen by Dr. Burr Medico. Patient agreed. Patient was calm, even tone and rational. Could answer all my questions. He agreed that he had not slept all night, he felt jittery and nervous (due to steroids being part of chemotherapy treatment plan), and he had been eating. This nurse called [facility] and informed them of the patient coming and that he was a patient of Dr. Burr Medico.
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
## 2015-12-20 ENCOUNTER — Ambulatory Visit: Payer: Managed Care, Other (non HMO)

## 2015-12-20 ENCOUNTER — Encounter: Payer: Self-pay | Admitting: Radiation Oncology

## 2015-12-20 ENCOUNTER — Encounter: Payer: Managed Care, Other (non HMO) | Admitting: Cardiothoracic Surgery

## 2015-12-20 ENCOUNTER — Ambulatory Visit
Admission: RE | Admit: 2015-12-20 | Discharge: 2015-12-20 | Disposition: A | Discharge status: Home / Self Care | Payer: Managed Care, Other (non HMO) | Source: Ambulatory Visit | Attending: Radiation Oncology | Admitting: Radiation Oncology

## 2015-12-20 ENCOUNTER — Other Ambulatory Visit: Payer: Self-pay | Admitting: Hematology

## 2015-12-20 ENCOUNTER — Other Ambulatory Visit: Payer: Self-pay

## 2015-12-20 ENCOUNTER — Other Ambulatory Visit (HOSPITAL_COMMUNITY): Payer: Self-pay

## 2015-12-20 VITALS — BP 118/77 | HR 75 | Temp 97.6°F | Resp 20 | Wt 176.4 lb

## 2015-12-20 DIAGNOSIS — Z800 Family history of malignant neoplasm of digestive organs: Secondary | ICD-10-CM | POA: Diagnosis not present

## 2015-12-20 DIAGNOSIS — R634 Abnormal weight loss: Secondary | ICD-10-CM | POA: Diagnosis not present

## 2015-12-20 DIAGNOSIS — R001 Bradycardia, unspecified: Secondary | ICD-10-CM | POA: Diagnosis not present

## 2015-12-20 DIAGNOSIS — F4325 Adjustment disorder with mixed disturbance of emotions and conduct: Secondary | ICD-10-CM | POA: Diagnosis not present

## 2015-12-20 DIAGNOSIS — Z7984 Long term (current) use of oral hypoglycemic drugs: Secondary | ICD-10-CM | POA: Diagnosis not present

## 2015-12-20 DIAGNOSIS — C154 Malignant neoplasm of middle third of esophagus: Secondary | ICD-10-CM | POA: Diagnosis not present

## 2015-12-20 DIAGNOSIS — M546 Pain in thoracic spine: Secondary | ICD-10-CM | POA: Diagnosis not present

## 2015-12-20 DIAGNOSIS — R1310 Dysphagia, unspecified: Secondary | ICD-10-CM | POA: Diagnosis not present

## 2015-12-20 DIAGNOSIS — Z7982 Long term (current) use of aspirin: Secondary | ICD-10-CM | POA: Diagnosis not present

## 2015-12-20 DIAGNOSIS — K219 Gastro-esophageal reflux disease without esophagitis: Secondary | ICD-10-CM | POA: Diagnosis not present

## 2015-12-20 LAB — I-STAT TROPONIN, ED
Troponin i, poc: 0 ng/mL (ref 0.00–0.08)
Troponin i, poc: 0 ng/mL (ref 0.00–0.08)

## 2015-12-20 LAB — CBG MONITORING, ED: Glucose-Capillary: 96 mg/dL (ref 65–99)

## 2015-12-20 MED ORDER — SUCRALFATE 1 G PO TABS: 1.0000 g | ORAL_TABLET | Freq: Four times a day (QID) | ORAL | 2 refills | Status: DC

## 2015-12-20 MED ORDER — OXYCODONE-ACETAMINOPHEN 5-325 MG PO TABS
1.0000 | ORAL_TABLET | Freq: Four times a day (QID) | ORAL | Status: DC | PRN
  Administered 2015-12-20: 2 via ORAL
  Filled 2015-12-20: qty 2

## 2015-12-20 NOTE — Care Management Note (Signed)
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 dx on 11/22/15  Pt less talkative than wife Pt answering general questions from CM Pt did state "yes" when asked if he was interested in hospice services after Cm discussed hospice/palliative services and available services through hospice/palliative Pt agreed to allow CM to contact [facility] hospice/palliative agency  Wife states they have not had contact with Oncology SW at this time Pt with noted [facility] admission x 0 and ED visits x 2 in the last 6 months  And [facility] banner for availability (referral not sent,pt only with ED visits not admissions at this time)  Oncologist listed as Dr Burr Medico - Wife states Dr Burr Medico has not discussed home hospice available services with them  Action/Plan  ED CM consulted by TTS  Staff Tom after pt discussed during SAPPU progression meeting to inquire if pt could be offered hospice home referral for counseling CM spoke with pt and wife in [facility] ED rm 11 about CM consult purpose, hospice/palliative services related to consult Wife seem more receptive to services for home and counseling than pt  ED Cm reviewed concerns with chaplin and requested a visit to pt prior to leaving ED to make sure pt and wife both ok and had some one to speak to prior to leaving Refer to [place] note  1241 ED Cm spoke with Dewaine Oats at [facility] referral center [phone] (pt choice of home hospice agency is [facility] hospice and palliative) provided dx esophageal squamous cell carcinoma, SAPPU dx Discussed pt to be d/c home.  Expected Discharge Date:    12/20/15 from [facility] ED               Expected Discharge Plan:    home   In-House Referral:   [facility] and [facility] services    Discharge planning Services   home hospice   Post Acute Care Choice:   home hospice  Choice offered to:    patient DME Arranged:    na  DME Agency:   na  HH Arranged:   na HH Agency:   na  Status of Service:   completed  If discussed at Long Length of Stay Meetings, dates discussed:    Additional Comments:  Robbie Lis, RN 12/20/2015, 12:39 PM

## 2015-12-20 NOTE — ED Provider Notes (Signed)
4:04 AM nurse states patient is asking for pain medicine for anterior chest and neck pain. Patient states he's been having pain like this for weeks and possibly months. He typically takes oxycodone. Review of then withdrawn a database does show that he was prescribed 120 Percocet on 9/11. Since he is in the ED, will continue this while he is awaiting his psychiatry disposition. He is overall well-appearing. This is probably not cardiac but given his age and location of pain, EKG and troponin will be obtained and troponins will be cycled while he is in the ED.   Sherwood Gambler, MD 12/20/15 [phone]

## 2015-12-20 NOTE — Discharge Instructions (Signed)
Keep your scheduled appointment for radiation therapy later today. Contact Dr.Ramachandron, or any of your doctors or return if concern for any reason

## 2015-12-20 NOTE — ED Provider Notes (Signed)
9:30 AM patient resting comfortably. No distress. He states "I feel good." He is pleasant and cooperative.  12:10 PM patient's wife is here and she feels ready to take him home. Patient is interviewed by psychiatry and psychiatry agrees patient stable for discharge from psychiatric standpoint He will go to radiation therapy at 1 PM today. He complains of chest pain at upper chest which last for one or 2 seconds at a time. Strongly doubt cardiac etiology ED ECG REPORT   Date: 12/20/2015  Rate: 60  Rhythm: normal sinus rhythm  QRS Axis: normal  Intervals: normal  ST/T Wave abnormalities: nonspecific T wave changes  Conduction Disutrbances:left bundle branch block  Narrative Interpretation:   Old EKG Reviewed: unchanged  I have personally reviewed the EKG tracing and agree with the computerized printout as noted. Results for orders placed or performed during the hospital encounter of 12/19/15  Comprehensive metabolic panel  Result Value Ref Range   Sodium 138 135 - 145 mmol/L   Potassium 4.4 3.5 - 5.1 mmol/L   Chloride 104 101 - 111 mmol/L   CO2 26 22 - 32 mmol/L   Glucose, Bld 99 65 - 99 mg/dL   BUN 11 6 - 20 mg/dL   Creatinine, Ser 0.75 0.61 - 1.24 mg/dL   Calcium 9.6 8.9 - 10.3 mg/dL   Total Protein 8.3 (H) 6.5 - 8.1 g/dL   Albumin 4.2 3.5 - 5.0 g/dL   AST 20 15 - 41 U/L   ALT 21 17 - 63 U/L   Alkaline Phosphatase 78 38 - 126 U/L   Total Bilirubin 0.4 0.3 - 1.2 mg/dL   GFR calc non Af Amer >60 >60 mL/min   GFR calc Af Amer >60 >60 mL/min   Anion gap 8 5 - 15  Ethanol  Result Value Ref Range   Alcohol, Ethyl (B) <5 <5 mg/dL  CBC with Diff  Result Value Ref Range   WBC 7.3 4.0 - 10.5 K/uL   RBC 5.00 4.22 - 5.81 MIL/uL   Hemoglobin 11.5 (L) 13.0 - 17.0 g/dL   HCT 36.3 (L) 39.0 - 52.0 %   MCV 72.6 (L) 78.0 - 100.0 fL   MCH 23.0 (L) 26.0 - 34.0 pg   MCHC 31.7 30.0 - 36.0 g/dL   RDW 14.7 11.5 - 15.5 %   Platelets 332 150 - 400 K/uL   Neutrophils Relative % 86 %    Lymphocytes Relative 7 %   Monocytes Relative 7 %   Eosinophils Relative 0 %   Basophils Relative 0 %   Neutro Abs 6.3 1.7 - 7.7 K/uL   Lymphs Abs 0.5 (L) 0.7 - 4.0 K/uL   Monocytes Absolute 0.5 0.1 - 1.0 K/uL   Eosinophils Absolute 0.0 0.0 - 0.7 K/uL   Basophils Absolute 0.0 0.0 - 0.1 K/uL   Smear Review MORPHOLOGY UNREMARKABLE   Urine rapid drug screen (hosp performed)not at [facility]  Result Value Ref Range   Opiates NONE DETECTED NONE DETECTED   Cocaine NONE DETECTED NONE DETECTED   Benzodiazepines NONE DETECTED NONE DETECTED   Amphetamines NONE DETECTED NONE DETECTED   Tetrahydrocannabinol NONE DETECTED NONE DETECTED   Barbiturates NONE DETECTED NONE DETECTED  Ammonia  Result Value Ref Range   Ammonia 21 9 - 35 umol/L  Urinalysis, Routine w reflex microscopic (not at [facility])  Result Value Ref Range   Color, Urine YELLOW YELLOW   APPearance CLEAR CLEAR   Specific Gravity, Urine 1.027 1.005 - 1.030   pH 5.0  5.0 - 8.0   Glucose, UA NEGATIVE NEGATIVE mg/dL   Hgb urine dipstick NEGATIVE NEGATIVE   Bilirubin Urine NEGATIVE NEGATIVE   Ketones, ur NEGATIVE NEGATIVE mg/dL   Protein, ur NEGATIVE NEGATIVE mg/dL   Nitrite NEGATIVE NEGATIVE   Leukocytes, UA NEGATIVE NEGATIVE  I-stat troponin, ED  Result Value Ref Range   Troponin i, poc 0.00 0.00 - 0.08 ng/mL   Comment 3          CBG monitoring, ED  Result Value Ref Range   Glucose-Capillary 96 65 - 99 mg/dL   Dg Chest 2 View  Result Date: 12/04/2015 CLINICAL DATA:  75 year old male with intermittent midsternal chest pain. Recent diagnosis of esophageal cancer. EXAM: CHEST  2 VIEW COMPARISON:  PETCT dated 12/03/2015 FINDINGS: The lungs are clear. There is no pleural effusion or pneumothorax. The cardiac silhouette is within normal limits. No acute osseous pathology. IMPRESSION: No active cardiopulmonary disease. Electronically Signed   By: Anner Crete M.D.   On: 12/04/2015 23:51   Ct Head Wo Contrast  Result Date:  12/19/2015 CLINICAL DATA:  Patient with history of psychiatric issues. History of esophageal carcinoma. EXAM: CT HEAD WITHOUT CONTRAST TECHNIQUE: Contiguous axial images were obtained from the base of the skull through the vertex without intravenous contrast. COMPARISON:  Brain CT 01/05/2014 FINDINGS: Brain: Ventricles and sulci are appropriate for patient's age. No evidence for acute cortically based infarct, intracranial hemorrhage, mass lesion or mass-effect. Vascular: No hyperdense vessel or unexpected calcification. Skull: Normal. Negative for fracture or focal lesion. Sinuses/Orbits: No acute finding. Other: None. IMPRESSION: No acute intracranial process. Electronically Signed   By: Lovey Newcomer M.D.   On: 12/19/2015 15:51   Nm Pet Image Initial (pi) Skull Base To Thigh  Result Date: 12/03/2015 CLINICAL DATA:  Initial treatment strategy for esophageal cancer of the middle third of the esophagus. EXAM: NUCLEAR MEDICINE PET SKULL BASE TO THIGH TECHNIQUE: 12.7 mCi F-18 FDG was injected intravenously. Full-ring PET imaging was performed from the skull base to thigh after the radiotracer. CT data was obtained and used for attenuation correction and anatomic localization. FASTING BLOOD GLUCOSE:  Value: 11 mg/dl COMPARISON:  CT abdomen 11/13/2015 FINDINGS: NECK No hypermetabolic lymph nodes in the neck. CHEST A 3 cm segment of circumferential esophageal wall thickening at the level of the carina with intense metabolic activity (SUV max equal 18.2). Single wall thickness measures 1.5 cm High RIGHT paratracheal lymph node measures 7 mm (image 964, series 4) with low metabolic activity the hypermetabolic activity (SUV max equaled 2.1). No hypermetabolic paraesophageal lymph nodes. No suspicious pulmonary nodules. ABDOMEN/PELVIS No hypermetabolic gastrohepatic ligament nodes. No abnormal metabolic activity in the liver. No hypermetabolic retroperitoneal or pelvic lymph nodes. Physiologic activity noted within the  colon. SKELETON No focal hypermetabolic activity to suggest skeletal metastasis. IMPRESSION: 1. Long segment of hypermetabolic thickening in the mid esophagus consistent with esophageal carcinoma. 2. No clear evidence of mediastinal nodal metastasis. Single high RIGHT paratracheal lymph node with low metabolic activity 3. No evidence of liver metastasis or upper abdominal nodal metastasis Electronically Signed   By: Suzy Bouchard M.D.   On: 12/03/2015 10:53  Diagnosis #1 atypical chest pain #2 aggressive behavior   Orlie Dakin, MD 12/20/15 1217
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
## 2015-12-20 NOTE — ED Notes (Signed)
Delay in patients vitals was assisting other nurse with patient

## 2015-12-21 NOTE — Progress Notes (Signed)
Department of Radiation Oncology  Phone:  [phone] Fax:        [phone]  Weekly Treatment Note    Name: Juan [name] Date: 12/21/2015 MRN: [id number] DOB: 1940-11-29   Diagnosis:     ICD-9-CM ICD-10-CM   1. Malignant neoplasm of middle third of esophagus (HCC) 150.4 C15.4      Current dose: 18 Gy  Current fraction: 9   MEDICATIONS: Current Outpatient Prescriptions  Medication Sig Dispense Refill  . albuterol (PROVENTIL HFA;VENTOLIN HFA) 108 (90 BASE) MCG/ACT inhaler Inhale 2 puffs into the lungs every 4 (four) hours as needed for wheezing. 1 Inhaler 0  . amitriptyline (ELAVIL) 10 MG tablet Take 10 mg by mouth at bedtime. 11/16/15 - Take 1 tablet at Bedtime x 1 Week, then increase to 2 tablets Once Daily Orally- 30 days    . aspirin EC 81 MG tablet Take 81 mg by mouth daily.    Marland Kitchen atorvastatin (LIPITOR) 20 MG tablet Take 20 mg by mouth at bedtime.     . metFORMIN (GLUCOPHAGE) 500 MG tablet Take 500 mg by mouth 2 (two) times daily with a meal.     . omeprazole (PRILOSEC) 40 MG capsule Take 40 mg by mouth daily.     Marland Kitchen oxyCODONE-acetaminophen (PERCOCET/ROXICET) 5-325 MG tablet Take 1-2 tablets by mouth every 6 (six) hours as needed for severe pain. 120 tablet 0  . Wound Dressings (SONAFINE) Apply 1 application topically daily.    . ondansetron (ZOFRAN) 8 MG tablet Take 1 tablet (8 mg total) by mouth 2 (two) times daily as needed for refractory nausea / vomiting. Start on day 3 after chemo. (Patient not taking: Reported on 12/20/2015) 30 tablet 1  . prochlorperazine (COMPAZINE) 10 MG tablet Take 1 tablet (10 mg total) by mouth every 6 (six) hours as needed (Nausea or vomiting). (Patient not taking: Reported on 12/20/2015) 30 tablet 1  . sucralfate (CARAFATE) 1 g tablet Take 1 tablet (1 g total) by mouth 4 (four) times daily. 120 tablet 2   No current facility-administered medications for this encounter.      ALLERGIES: Review of patient's allergies indicates no known  allergies.   LABORATORY DATA:  Lab Results  Component Value Date   WBC 7.3 12/19/2015   HGB 11.5 (L) 12/19/2015   HCT 36.3 (L) 12/19/2015   MCV 72.6 (L) 12/19/2015   PLT 332 12/19/2015   Lab Results  Component Value Date   NA 138 12/19/2015   K 4.4 12/19/2015   CL 104 12/19/2015   CO2 26 12/19/2015   Lab Results  Component Value Date   ALT 21 12/19/2015   AST 20 12/19/2015   ALKPHOS 78 12/19/2015   BILITOT 0.4 12/19/2015     NARRATIVE: Juan [name] was seen today for weekly treatment management. The chart was checked and the patient's films were reviewed.  Weekly rad tx esophagus 9/25 completed, was in the ED last night this am, c/o burning in upper chest/throat and aggressive behavior from steroids wife states,  Weak and in w/c, takes prilosec for burning in throat after eating,  Eats soft foods, drinks plenty water stated, fatigued 10:06 AM BP 118/77 (BP Location: Left Arm, Patient Position: Bed low/side rails up, Cuff Size: Normal)   Pulse 75   Temp 97.6 F (36.4 C) (Axillary)   Resp 20   Wt 176 lb 6.4 oz (80 kg)   SpO2 100% Comment: room air  BMI 26.05 kg/m   Wt Readings from Last 3 Encounters:  12/20/15 176 lb 6.4 oz (80 kg)  12/18/15 176 lb 4.8 oz (80 kg)  12/13/15 147 lb 3.2 oz (66.8 kg)    PHYSICAL EXAMINATION: weight is 176 lb 6.4 oz (80 kg). His axillary temperature is 97.6 F (36.4 C). His blood pressure is 118/77 and his pulse is 75. His respiration is 20 and oxygen saturation is 100%.        ASSESSMENT: The patient is doing satisfactorily with treatment.  PLAN: We will continue with the patient's radiation treatment as planned. The patient was given a prescription for Carafate given his description of some esophagitis/difficulty swallowing. Some of this may be due to some swelling in the treatment area is well which should resolve as he further proceed with treatment and hopefully the tumor shrinks.

## 2015-12-23 ENCOUNTER — Ambulatory Visit: Payer: Managed Care, Other (non HMO)

## 2015-12-23 ENCOUNTER — Ambulatory Visit
Admission: RE | Admit: 2015-12-23 | Discharge: 2015-12-23 | Disposition: A | Discharge status: Home / Self Care | Payer: Managed Care, Other (non HMO) | Source: Ambulatory Visit | Attending: Radiation Oncology | Admitting: Radiation Oncology

## 2015-12-23 ENCOUNTER — Other Ambulatory Visit: Payer: Self-pay | Admitting: General Surgery

## 2015-12-23 ENCOUNTER — Telehealth: Payer: Self-pay | Admitting: Hematology

## 2015-12-23 ENCOUNTER — Other Ambulatory Visit: Payer: Self-pay | Admitting: Radiology

## 2015-12-23 DIAGNOSIS — Z800 Family history of malignant neoplasm of digestive organs: Secondary | ICD-10-CM | POA: Diagnosis not present

## 2015-12-23 DIAGNOSIS — R1310 Dysphagia, unspecified: Secondary | ICD-10-CM | POA: Diagnosis not present

## 2015-12-23 DIAGNOSIS — R001 Bradycardia, unspecified: Secondary | ICD-10-CM | POA: Diagnosis not present

## 2015-12-23 DIAGNOSIS — K219 Gastro-esophageal reflux disease without esophagitis: Secondary | ICD-10-CM | POA: Diagnosis not present

## 2015-12-23 DIAGNOSIS — C154 Malignant neoplasm of middle third of esophagus: Secondary | ICD-10-CM | POA: Diagnosis not present

## 2015-12-23 DIAGNOSIS — Z7984 Long term (current) use of oral hypoglycemic drugs: Secondary | ICD-10-CM | POA: Diagnosis not present

## 2015-12-23 DIAGNOSIS — Z7982 Long term (current) use of aspirin: Secondary | ICD-10-CM | POA: Diagnosis not present

## 2015-12-23 DIAGNOSIS — M546 Pain in thoracic spine: Secondary | ICD-10-CM | POA: Diagnosis not present

## 2015-12-23 DIAGNOSIS — R634 Abnormal weight loss: Secondary | ICD-10-CM | POA: Diagnosis not present
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
## 2015-12-24 ENCOUNTER — Other Ambulatory Visit: Payer: Self-pay | Admitting: Hematology

## 2015-12-24 ENCOUNTER — Ambulatory Visit: Payer: Managed Care, Other (non HMO)

## 2015-12-24 ENCOUNTER — Encounter (HOSPITAL_COMMUNITY)
Admission: RE | Admit: 2015-12-24 | Discharge: 2015-12-24 | Disposition: A | Discharge status: Home / Self Care | Payer: Managed Care, Other (non HMO) | Source: Ambulatory Visit | Attending: Hematology | Admitting: Hematology

## 2015-12-24 ENCOUNTER — Encounter (HOSPITAL_COMMUNITY): Payer: Self-pay

## 2015-12-24 ENCOUNTER — Ambulatory Visit (HOSPITAL_COMMUNITY)
Admission: RE | Admit: 2015-12-24 | Discharge: 2015-12-24 | Disposition: A | Discharge status: Home / Self Care | Payer: Managed Care, Other (non HMO) | Source: Ambulatory Visit | Attending: Hematology | Admitting: Hematology

## 2015-12-24 DIAGNOSIS — Z9221 Personal history of antineoplastic chemotherapy: Secondary | ICD-10-CM | POA: Diagnosis not present

## 2015-12-24 DIAGNOSIS — Z7982 Long term (current) use of aspirin: Secondary | ICD-10-CM | POA: Diagnosis not present

## 2015-12-24 DIAGNOSIS — Z800 Family history of malignant neoplasm of digestive organs: Secondary | ICD-10-CM | POA: Insufficient documentation

## 2015-12-24 DIAGNOSIS — C154 Malignant neoplasm of middle third of esophagus: Secondary | ICD-10-CM

## 2015-12-24 DIAGNOSIS — C159 Malignant neoplasm of esophagus, unspecified: Secondary | ICD-10-CM | POA: Diagnosis not present

## 2015-12-24 DIAGNOSIS — Z87891 Personal history of nicotine dependence: Secondary | ICD-10-CM | POA: Insufficient documentation

## 2015-12-24 DIAGNOSIS — Z7984 Long term (current) use of oral hypoglycemic drugs: Secondary | ICD-10-CM | POA: Insufficient documentation

## 2015-12-24 DIAGNOSIS — K219 Gastro-esophageal reflux disease without esophagitis: Secondary | ICD-10-CM | POA: Insufficient documentation

## 2015-12-24 HISTORY — PX: IR GENERIC HISTORICAL: IMG1180011

## 2015-12-24 LAB — CBC WITH DIFFERENTIAL/PLATELET
Basophils Absolute: 0 10*3/uL (ref 0.0–0.1)
Basophils Relative: 0 %
Eosinophils Absolute: 0.1 10*3/uL (ref 0.0–0.7)
Eosinophils Relative: 3 %
HCT: 35.4 % — ABNORMAL LOW (ref 39.0–52.0)
Hemoglobin: 11.2 g/dL — ABNORMAL LOW (ref 13.0–17.0)
Lymphocytes Relative: 14 %
Lymphs Abs: 0.4 10*3/uL — ABNORMAL LOW (ref 0.7–4.0)
MCH: 23 pg — ABNORMAL LOW (ref 26.0–34.0)
MCHC: 31.6 g/dL (ref 30.0–36.0)
MCV: 72.8 fL — ABNORMAL LOW (ref 78.0–100.0)
Monocytes Absolute: 0.3 10*3/uL (ref 0.1–1.0)
Monocytes Relative: 8 %
Neutro Abs: 2.4 10*3/uL (ref 1.7–7.7)
Neutrophils Relative %: 75 %
Platelets: 287 10*3/uL (ref 150–400)
RBC: 4.86 MIL/uL (ref 4.22–5.81)
RDW: 14.8 % (ref 11.5–15.5)
WBC: 3.2 10*3/uL — ABNORMAL LOW (ref 4.0–10.5)

## 2015-12-24 LAB — BASIC METABOLIC PANEL
Anion gap: 8 (ref 5–15)
BUN: 9 mg/dL (ref 6–20)
CO2: 24 mmol/L (ref 22–32)
Calcium: 8.8 mg/dL — ABNORMAL LOW (ref 8.9–10.3)
Chloride: 105 mmol/L (ref 101–111)
Creatinine, Ser: 0.67 mg/dL (ref 0.61–1.24)
GFR calc Af Amer: >60 mL/min (ref >60)
GFR calc non Af Amer: >60 mL/min (ref >60)
Glucose, Bld: 131 mg/dL — ABNORMAL HIGH (ref 65–99)
Potassium: 4.6 mmol/L (ref 3.5–5.1)
Sodium: 137 mmol/L (ref 135–145)

## 2015-12-24 LAB — PROTIME-INR
INR: 1.02
Prothrombin Time: 13.4 seconds (ref 11.4–15.2)

## 2015-12-24 LAB — APTT: aPTT: 31 seconds (ref 24–36)

## 2015-12-24 LAB — GLUCOSE, CAPILLARY: Glucose-Capillary: 133 mg/dL — ABNORMAL HIGH (ref 65–99)

## 2015-12-24 MED ORDER — HEPARIN SOD (PORK) LOCK FLUSH 100 UNIT/ML IV SOLN
INTRAVENOUS | Status: AC
  Filled 2015-12-24: qty 5

## 2015-12-24 MED ORDER — LIDOCAINE-EPINEPHRINE (PF) 2 %-1:200000 IJ SOLN
INTRAMUSCULAR | Status: AC | PRN
  Administered 2015-12-24: 20 mL

## 2015-12-24 MED ORDER — MIDAZOLAM HCL 2 MG/2ML IJ SOLN
INTRAMUSCULAR | Status: AC
  Filled 2015-12-24: qty 6

## 2015-12-24 MED ORDER — CEFAZOLIN SODIUM-DEXTROSE 2-4 GM/100ML-% IV SOLN
2.0000 g | INTRAVENOUS | Status: AC
  Administered 2015-12-24: 2 g via INTRAVENOUS
  Filled 2015-12-24: qty 100

## 2015-12-24 MED ORDER — MIDAZOLAM HCL 2 MG/2ML IJ SOLN
INTRAMUSCULAR | Status: AC | PRN
  Administered 2015-12-24 (×2): 1 mg via INTRAVENOUS

## 2015-12-24 MED ORDER — FENTANYL CITRATE (PF) 100 MCG/2ML IJ SOLN
INTRAMUSCULAR | Status: AC
  Filled 2015-12-24: qty 4

## 2015-12-24 MED ORDER — LIDOCAINE-EPINEPHRINE (PF) 2 %-1:200000 IJ SOLN
INTRAMUSCULAR | Status: AC
  Filled 2015-12-24: qty 20

## 2015-12-24 MED ORDER — HEPARIN SOD (PORK) LOCK FLUSH 100 UNIT/ML IV SOLN
INTRAVENOUS | Status: AC | PRN
  Administered 2015-12-24: 500 [IU]

## 2015-12-24 MED ORDER — FENTANYL CITRATE (PF) 100 MCG/2ML IJ SOLN
INTRAMUSCULAR | Status: AC | PRN
  Administered 2015-12-24: 50 ug via INTRAVENOUS

## 2015-12-24 MED ORDER — SODIUM CHLORIDE 0.9 % IV SOLN
INTRAVENOUS | Status: DC
  Administered 2015-12-24: 13:00:00 via INTRAVENOUS

## 2015-12-24 NOTE — Consult Note (Signed)
Chief Complaint: Patient was seen in consultation today for port a cath placement  Referring Physician(s): Feng,Yan  Supervising Physician: Arne Cleveland  Patient Status: Outpatient  History of Present Illness: Juan [name] is a 75 y.o. 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  Past Medical History:  Diagnosis Date  . Esophageal cancer ([place])   . Reflux     Past Surgical History:  Procedure Laterality Date  . EUS N/A 12/05/2015   Procedure: UPPER ENDOSCOPIC ULTRASOUND (EUS) LINEAR;  Surgeon: Carol Ada, MD;  Location: [facility];  Service: Endoscopy;  Laterality: N/A;  . HERNIA REPAIR    . SHOULDER ARTHROSCOPY W/ SUPERIOR LABRAL ANTERIOR POSTERIOR LESION REPAIR      Allergies: Review of patient's allergies indicates no known allergies.  Medications: Prior to Admission medications   Medication Sig Start Date End Date Taking? Authorizing Provider  amitriptyline (ELAVIL) 10 MG tablet Take 10 mg by mouth at bedtime. 11/16/15 - Take 1 tablet at Bedtime x 1 Week, then increase to 2 tablets Once Daily Orally- 30 days 11/16/15 11/15/16 Yes Historical Provider, MD  aspirin EC 81 MG tablet Take 81 mg by mouth daily.   Yes Historical Provider, MD  atorvastatin (LIPITOR) 20 MG tablet Take 20 mg by mouth at bedtime.  11/30/13  Yes Historical Provider, MD  metFORMIN (GLUCOPHAGE) 500 MG tablet Take 500 mg by mouth 2 (two) times daily with a meal.    Yes Historical Provider, MD  omeprazole (PRILOSEC) 40 MG capsule Take 40 mg by mouth daily.  12/18/13  Yes Historical Provider, MD  ondansetron (ZOFRAN) 8 MG tablet Take 1 tablet (8 mg total) by mouth 2 (two) times daily as needed for refractory nausea / vomiting. Start on day 3 after chemo. 12/11/15  Yes [name], MD  oxyCODONE-acetaminophen (PERCOCET/ROXICET) 5-325 MG tablet Take 1-2 tablets by mouth every  6 (six) hours as needed for severe pain. 12/09/15  Yes Hayden Pedro, PA-C  prochlorperazine (COMPAZINE) 10 MG tablet Take 1 tablet (10 mg total) by mouth every 6 (six) hours as needed (Nausea or vomiting). 12/11/15  Yes [name], MD  sucralfate (CARAFATE) 1 g tablet Take 1 tablet (1 g total) by mouth 4 (four) times daily. 12/20/15  Yes Kyung Rudd, MD  Wound Dressings (SONAFINE) Apply 1 application topically daily. 12/10/15  Yes Kyung Rudd, MD  albuterol (PROVENTIL HFA;VENTOLIN HFA) 108 (90 BASE) MCG/ACT inhaler Inhale 2 puffs into the lungs every 4 (four) hours as needed for wheezing. 03/23/12   [name] Porter, MD     Family History  Problem Relation Age of Onset  . Colon cancer Father 52  . Colon cancer Brother 53    Social History   Social History  . Marital status: Married    Spouse name: N/A  . Number of children: N/A  . Years of education: N/A   Social History Main Topics  . Smoking status: Former Smoker    Packs/day: 2.00    Years: 40.00    Quit date: 03/31/1999  . Smokeless tobacco: Never Used  . Alcohol use No     Comment: weekend binge drinking for 30-40 years, quit in 2001  . Drug use: No  . Sexual activity: Not Asked   Other Topics Concern  . 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 Recent diagnosis of esophageal cancer. EXAM: CHEST  2 VIEW COMPARISON:  PETCT dated 12/03/2015 FINDINGS: The lungs are clear.  There is no pleural effusion or pneumothorax. The cardiac silhouette is within normal limits. No acute osseous pathology. IMPRESSION: No active cardiopulmonary disease. Electronically Signed   By: Anner Crete M.D.   On: 12/04/2015 23:51   Ct Head Wo Contrast  Result Date: 12/19/2015 CLINICAL DATA:  Patient with history of psychiatric issues. History of esophageal carcinoma. EXAM: CT HEAD WITHOUT CONTRAST TECHNIQUE: Contiguous axial images were obtained from the base of the skull through the vertex without intravenous contrast. COMPARISON:  Brain CT 01/05/2014 FINDINGS: Brain: Ventricles and sulci are appropriate for patient's age. No evidence for acute cortically based infarct, intracranial hemorrhage, mass lesion or mass-effect. Vascular: No hyperdense vessel or unexpected calcification. Skull: Normal. Negative for fracture or focal lesion. Sinuses/Orbits: No acute finding. Other: None. IMPRESSION: No acute intracranial process. Electronically Signed   By: Lovey Newcomer M.D.   On: 12/19/2015 15:51   Nm Pet Image Initial (pi) Skull Base To Thigh  Result Date: 12/03/2015 CLINICAL DATA:  Initial treatment strategy for esophageal cancer of the middle third of the esophagus. EXAM: NUCLEAR MEDICINE PET SKULL BASE TO THIGH TECHNIQUE: 12.7 mCi F-18 FDG was injected intravenously. Full-ring PET imaging was performed from the skull base to thigh after the radiotracer. CT data was obtained and used for attenuation correction and anatomic localization. FASTING BLOOD GLUCOSE:  Value: 11 mg/dl COMPARISON:  CT abdomen 11/13/2015 FINDINGS: NECK No hypermetabolic lymph nodes in the neck. CHEST A 3 cm segment of circumferential esophageal wall thickening at the level of the carina with intense metabolic activity (SUV max equal 18.2). Single wall thickness measures 1.5 cm High RIGHT paratracheal lymph node measures 7 mm (image 964, series 4) with low metabolic activity the hypermetabolic activity (SUV max equaled 2.1). No  hypermetabolic paraesophageal lymph nodes. No suspicious pulmonary nodules. ABDOMEN/PELVIS No hypermetabolic gastrohepatic ligament nodes. No abnormal metabolic activity in the liver. No hypermetabolic retroperitoneal or pelvic lymph nodes. Physiologic activity noted within the colon. SKELETON No focal hypermetabolic activity to suggest skeletal metastasis. IMPRESSION: 1. Long segment of hypermetabolic thickening in the mid esophagus consistent with esophageal carcinoma. 2. No clear evidence of mediastinal nodal metastasis. Single high RIGHT paratracheal lymph node with low metabolic activity 3. No evidence of liver metastasis or upper abdominal nodal metastasis Electronically Signed   By: Suzy Bouchard M.D.   On: 12/03/2015 10:53    Labs:  CBC:  Recent Labs  12/11/15 1104 12/18/15 0809 12/19/15 1607 12/24/15 1250  WBC 7.3 4.6 7.3 3.2*  HGB 12.9* 12.2* 11.5* 11.2*  HCT 40.4 38.0* 36.3* 35.4*  PLT 304 265 332 287    COAGS:  Recent Labs  12/24/15 1250  INR 1.02  APTT 31    BMP:  Recent Labs  12/04/15 0823 12/04/15 2333 12/11/15 1104 12/18/15 0809 12/19/15 1607  NA 139 137 137 140 138  K 4.3 4.6 3.9 4.0 4.4  CL  --  103  --   --  104  CO2 23  --  24 25 26   GLUCOSE 126 104* 117 118 99  BUN 13.2 16 10.9 8.7 11  CALCIUM 9.5  --  9.8 9.6 9.6  CREATININE 0.9 0.70 0.8 1.0 0.75  GFRNONAA  --   --   --   --  >60  GFRAA  --   --   --   --  >60    LIVER FUNCTION TESTS:  Recent Labs  12/04/15 0823 12/11/15 1104 12/18/15 0809 12/19/15 1607  BILITOT 0.32 0.53 0.48 0.4  AST 15 13 12 20   ALT 10 13 14 21   ALKPHOS 91 100 95 78  PROT 7.6 8.0 7.4 8.3*  ALBUMIN 3.7 3.7 3.3* 4.2    TUMOR MARKERS: No results for input(s): AFPTM, CEA, CA199, CHROMGRNA in the last 8760 hours.  Assessment and Plan: 75 y.o. 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     Thank you for this interesting consult.  I greatly enjoyed meeting Juan [name] and look forward to participating in their care.  A copy of this report was sent to the requesting provider on this date.  Electronically Signed: D. Rowe Robert 12/24/2015, 1:26 PM   I spent a total of 25 minutes in face to face in clinical consultation, greater than 50% of which was counseling/coordinating care for port a cath placement

## 2015-12-24 NOTE — Procedures (Signed)
R IJ Port cathter placement with US and fluoroscopy No complication No blood loss. See complete dictation in [facility] PACS.  

## 2015-12-24 NOTE — Discharge Instructions (Signed)
Moderate Conscious Sedation, Adult, Care After °Refer to this sheet in the next few weeks. These instructions provide you with information on caring for yourself after your procedure. Your health care provider may also give you more specific instructions. Your treatment has been planned according to current medical practices, but problems sometimes occur. Call your health care provider if you have any problems or questions after your procedure. °WHAT TO EXPECT AFTER THE PROCEDURE  °After your procedure: °· You may feel sleepy, clumsy, and have poor balance for several hours. °· Vomiting may occur if you eat too soon after the procedure. °HOME CARE INSTRUCTIONS °· Do not participate in any activities where you could become injured for at least 24 hours. Do not: °¨ Drive. °¨ Swim. °¨ Ride a bicycle. °¨ Operate heavy machinery. °¨ Cook. °¨ Use power tools. °¨ Climb ladders. °¨ Work from a high place. °· Do not make important decisions or sign legal documents until you are improved. °· If you vomit, drink water, juice, or soup when you can drink without vomiting. Make sure you have little or no nausea before eating solid foods. °· Only take over-the-counter or prescription medicines for pain, discomfort, or fever as directed by your health care provider. °· Make sure you and your family fully understand everything about the medicines given to you, including what side effects may occur. °· You should not drink alcohol, take sleeping pills, or take medicines that cause drowsiness for at least 24 hours. °· If you smoke, do not smoke without supervision. °· If you are feeling better, you may resume normal activities 24 hours after you were sedated. °· Keep all appointments with your health care provider. °SEEK MEDICAL CARE IF: °· Your skin is pale or bluish in color. °· You continue to feel nauseous or vomit. °· Your pain is getting worse and is not helped by medicine. °· You have bleeding or swelling. °· You are still  sleepy or feeling clumsy after 24 hours. °SEEK IMMEDIATE MEDICAL CARE IF: °· You develop a rash. °· You have difficulty breathing. °· You develop any type of allergic problem. °· You have a fever. °MAKE SURE YOU: °· Understand these instructions. °· Will watch your condition. °· Will get help right away if you are not doing well or get worse. °  °This information is not intended to replace advice given to you by your health care provider. Make sure you discuss any questions you have with your health care provider. °  °Document Released: 01/04/2013 Document Revised: 04/06/2014 Document Reviewed: 01/04/2013 °Elsevier Interactive Patient Education ©2016 [employer] ° °Implanted Port Insertion, Care After °Refer to this sheet in the next few weeks. These instructions provide you with information on caring for yourself after your procedure. Your health care provider may also give you more specific instructions. Your treatment has been planned according to current medical practices, but problems sometimes occur. Call your health care provider if you have any problems or questions after your procedure. °WHAT TO EXPECT AFTER THE PROCEDURE °After your procedure, it is typical to have the following:  °· Discomfort at the port insertion site. Ice packs to the area will help. °· Bruising on the skin over the port. This will subside in 3-4 days. °HOME CARE INSTRUCTIONS °· After your port is placed, you will get a manufacturer's information card. The card has information about your port. Keep this card with you at all times.   °· Know what kind of port you have. There are many   types of ports available.   °· Wear a medical alert bracelet in case of an emergency. This can help alert health care workers that you have a port.   °· The port can stay in for as long as your health care provider believes it is necessary.   °· A home health care nurse may give medicines and take care of the port.   °· You or a family member can get  special training and directions for giving medicine and taking care of the port at home.   °SEEK MEDICAL CARE IF:  °· Your port does not flush or you are unable to get a blood return.   °· You have a fever or chills. °SEEK IMMEDIATE MEDICAL CARE IF: °· You have new fluid or pus coming from your incision.   °· You notice a bad smell coming from your incision site.   °· You have swelling, pain, or more redness at the incision or port site.   °· You have chest pain or shortness of breath. °  °This information is not intended to replace advice given to you by your health care provider. Make sure you discuss any questions you have with your health care provider. °  °Document Released: 01/04/2013 Document Revised: 03/21/2013 Document Reviewed: 01/04/2013 °Elsevier Interactive Patient Education ©2016 [employer] ° °

## 2015-12-25 ENCOUNTER — Ambulatory Visit: Payer: Managed Care, Other (non HMO)

## 2015-12-25 ENCOUNTER — Other Ambulatory Visit (HOSPITAL_BASED_OUTPATIENT_CLINIC_OR_DEPARTMENT_OTHER): Payer: Managed Care, Other (non HMO)

## 2015-12-25 ENCOUNTER — Ambulatory Visit (HOSPITAL_BASED_OUTPATIENT_CLINIC_OR_DEPARTMENT_OTHER): Payer: Managed Care, Other (non HMO)

## 2015-12-25 ENCOUNTER — Encounter: Payer: Self-pay | Admitting: *Deleted

## 2015-12-25 ENCOUNTER — Ambulatory Visit (HOSPITAL_BASED_OUTPATIENT_CLINIC_OR_DEPARTMENT_OTHER): Payer: Managed Care, Other (non HMO) | Admitting: Nurse Practitioner

## 2015-12-25 ENCOUNTER — Ambulatory Visit
Admission: RE | Admit: 2015-12-25 | Discharge: 2015-12-25 | Disposition: A | Discharge status: Home / Self Care | Payer: Managed Care, Other (non HMO) | Source: Ambulatory Visit | Attending: Radiation Oncology | Admitting: Radiation Oncology

## 2015-12-25 ENCOUNTER — Ambulatory Visit: Payer: Managed Care, Other (non HMO) | Admitting: Nutrition

## 2015-12-25 VITALS — BP 131/72 | HR 63 | Temp 97.9°F | Resp 18 | Ht 69.0 in | Wt 176.7 lb

## 2015-12-25 DIAGNOSIS — M546 Pain in thoracic spine: Secondary | ICD-10-CM | POA: Diagnosis not present

## 2015-12-25 DIAGNOSIS — Z7982 Long term (current) use of aspirin: Secondary | ICD-10-CM | POA: Diagnosis not present

## 2015-12-25 DIAGNOSIS — Z7984 Long term (current) use of oral hypoglycemic drugs: Secondary | ICD-10-CM | POA: Diagnosis not present

## 2015-12-25 DIAGNOSIS — Z5111 Encounter for antineoplastic chemotherapy: Secondary | ICD-10-CM

## 2015-12-25 DIAGNOSIS — C154 Malignant neoplasm of middle third of esophagus: Secondary | ICD-10-CM | POA: Diagnosis not present

## 2015-12-25 DIAGNOSIS — R1310 Dysphagia, unspecified: Secondary | ICD-10-CM | POA: Diagnosis not present

## 2015-12-25 DIAGNOSIS — R001 Bradycardia, unspecified: Secondary | ICD-10-CM | POA: Diagnosis not present

## 2015-12-25 DIAGNOSIS — Z95828 Presence of other vascular implants and grafts: Secondary | ICD-10-CM | POA: Insufficient documentation

## 2015-12-25 DIAGNOSIS — K219 Gastro-esophageal reflux disease without esophagitis: Secondary | ICD-10-CM | POA: Diagnosis not present

## 2015-12-25 DIAGNOSIS — R634 Abnormal weight loss: Secondary | ICD-10-CM | POA: Diagnosis not present

## 2015-12-25 DIAGNOSIS — Z800 Family history of malignant neoplasm of digestive organs: Secondary | ICD-10-CM | POA: Diagnosis not present

## 2015-12-25 LAB — CBC WITH DIFFERENTIAL/PLATELET
BASO%: 0.4 % (ref 0.0–2.0)
Basophils Absolute: 0 10*3/uL (ref 0.0–0.1)
EOS%: 2.8 % (ref 0.0–7.0)
Eosinophils Absolute: 0.1 10*3/uL (ref 0.0–0.5)
HCT: 34.1 % — ABNORMAL LOW (ref 38.4–49.9)
HGB: 10.6 g/dL — ABNORMAL LOW (ref 13.0–17.1)
LYMPH%: 12.7 % — ABNORMAL LOW (ref 14.0–49.0)
MCH: 22.6 pg — ABNORMAL LOW (ref 27.2–33.4)
MCHC: 31.1 g/dL — ABNORMAL LOW (ref 32.0–36.0)
MCV: 72.8 fL — ABNORMAL LOW (ref 79.3–98.0)
MONO#: 0.4 10*3/uL (ref 0.1–0.9)
MONO%: 14 % (ref 0.0–14.0)
NEUT#: 1.9 10*3/uL (ref 1.5–6.5)
NEUT%: 70.1 % (ref 39.0–75.0)
Platelets: 257 10*3/uL (ref 140–400)
RBC: 4.68 10*6/uL (ref 4.20–5.82)
RDW: 14.4 % (ref 11.0–14.6)
WBC: 2.8 10*3/uL — ABNORMAL LOW (ref 4.0–10.3)
lymph#: 0.4 10*3/uL — ABNORMAL LOW (ref 0.9–3.3)

## 2015-12-25 LAB — COMPREHENSIVE METABOLIC PANEL
ALT: 12 U/L (ref 0–55)
AST: 12 U/L (ref 5–34)
Albumin: 3.1 g/dL — ABNORMAL LOW (ref 3.5–5.0)
Alkaline Phosphatase: 90 U/L (ref 40–150)
Anion Gap: 8 mEq/L (ref 3–11)
BUN: 9.1 mg/dL (ref 7.0–26.0)
CO2: 24 mEq/L (ref 22–29)
Calcium: 9 mg/dL (ref 8.4–10.4)
Chloride: 105 mEq/L (ref 98–109)
Creatinine: 0.7 mg/dL (ref 0.7–1.3)
EGFR: >90 mL/min/{1.73_m2} (ref >90)
Glucose: 106 mg/dl (ref 70–140)
Potassium: 4.1 mEq/L (ref 3.5–5.1)
Sodium: 137 mEq/L (ref 136–145)
Total Bilirubin: 0.4 mg/dL (ref 0.20–1.20)
Total Protein: 6.8 g/dL (ref 6.4–8.3)

## 2015-12-25 MED ORDER — PACLITAXEL PROTEIN-BOUND CHEMO INJECTION 100 MG
45.0000 mg/m2 | Freq: Once | INTRAVENOUS | Status: AC
  Administered 2015-12-25: 100 mg via INTRAVENOUS
  Filled 2015-12-25: qty 20

## 2015-12-25 MED ORDER — PALONOSETRON HCL INJECTION 0.25 MG/5ML
INTRAVENOUS | Status: AC
  Filled 2015-12-25: qty 5

## 2015-12-25 MED ORDER — DIPHENHYDRAMINE HCL 50 MG/ML IJ SOLN
INTRAMUSCULAR | Status: AC
  Filled 2015-12-25: qty 1

## 2015-12-25 MED ORDER — SODIUM CHLORIDE 0.9 % IJ SOLN
10.0000 mL | INTRAMUSCULAR | Status: DC | PRN
  Administered 2015-12-25: 10 mL via INTRAVENOUS
  Filled 2015-12-25: qty 10

## 2015-12-25 MED ORDER — DIPHENHYDRAMINE HCL 50 MG/ML IJ SOLN
25.0000 mg | Freq: Once | INTRAMUSCULAR | Status: AC
  Administered 2015-12-25: 25 mg via INTRAVENOUS

## 2015-12-25 MED ORDER — HEPARIN SOD (PORK) LOCK FLUSH 100 UNIT/ML IV SOLN
500.0000 [IU] | Freq: Once | INTRAVENOUS | Status: AC | PRN
  Administered 2015-12-25: 500 [IU]
  Filled 2015-12-25: qty 5

## 2015-12-25 MED ORDER — FAMOTIDINE IN NACL 20-0.9 MG/50ML-% IV SOLN
INTRAVENOUS | Status: AC
  Filled 2015-12-25: qty 50

## 2015-12-25 MED ORDER — SODIUM CHLORIDE 0.9 % IV SOLN
Freq: Once | INTRAVENOUS | Status: AC
  Administered 2015-12-25: 12:00:00 via INTRAVENOUS

## 2015-12-25 MED ORDER — PALONOSETRON HCL INJECTION 0.25 MG/5ML
0.2500 mg | Freq: Once | INTRAVENOUS | Status: AC
  Administered 2015-12-25: 0.25 mg via INTRAVENOUS

## 2015-12-25 MED ORDER — FAMOTIDINE IN NACL 20-0.9 MG/50ML-% IV SOLN
20.0000 mg | Freq: Once | INTRAVENOUS | Status: AC
  Administered 2015-12-25: 20 mg via INTRAVENOUS

## 2015-12-25 MED ORDER — SODIUM CHLORIDE 0.9% FLUSH
10.0000 mL | INTRAVENOUS | Status: DC | PRN
  Administered 2015-12-25: 10 mL
  Filled 2015-12-25: qty 10

## 2015-12-25 MED ORDER — SODIUM CHLORIDE 0.9 % IV SOLN
193.4000 mg | Freq: Once | INTRAVENOUS | Status: AC
  Administered 2015-12-25: 190 mg via INTRAVENOUS
  Filled 2015-12-25: qty 19

## 2015-12-25 NOTE — Progress Notes (Signed)
Oncology Nurse Navigator Documentation  Oncology Nurse Navigator Flowsheets 12/25/2015  Navigator Location [facility]  Navigator Encounter Type Treatment  Telephone -  Abnormal Finding Date -  Confirmed Diagnosis Date -  Treatment Initiated Date -  Patient Visit Type MedOnc  Treatment Phase Active Tx--Taxol/Carbo  Barriers/Navigation Needs No barriers at this time;No Questions;No Needs  Education -  Interventions None required  Referrals -  Coordination of Care -  Education Method -  Support Groups/Services -Reports doing well with no side effects. Swallowing well at this time.  Acuity -  Time Spent with Patient 15

## 2015-12-25 NOTE — Patient Instructions (Signed)
[facility] Discharge Instructions for Patients Receiving Chemotherapy  Today you received the following chemotherapy agents Abraxane and Carboplatin.  To help prevent nausea and vomiting after your treatment, we encourage you to take your nausea medication as directed.   If you develop nausea and vomiting that is not controlled by your nausea medication, call the clinic.   BELOW ARE SYMPTOMS THAT SHOULD BE REPORTED IMMEDIATELY:  *FEVER GREATER THAN 100.5 F  *CHILLS WITH OR WITHOUT FEVER  NAUSEA AND VOMITING THAT IS NOT CONTROLLED WITH YOUR NAUSEA MEDICATION  *UNUSUAL SHORTNESS OF BREATH  *UNUSUAL BRUISING OR BLEEDING  TENDERNESS IN MOUTH AND THROAT WITH OR WITHOUT PRESENCE OF ULCERS  *URINARY PROBLEMS  *BOWEL PROBLEMS  UNUSUAL RASH Items with * indicate a potential emergency and should be followed up as soon as possible.  Feel free to call the clinic you have any questions or concerns. The clinic phone number is [phone].  Please show the [place] at check-in to the Emergency Department and triage nurse.    Implanted [facility] Guide An implanted port is a type of central line that is placed under the skin. Central lines are used to provide IV access when treatment or nutrition needs to be given through a person's veins. Implanted ports are used for long-term IV access. An implanted port may be placed because:   You need IV medicine that would be irritating to the small veins in your hands or arms.   You need long-term IV medicines, such as antibiotics.   You need IV nutrition for a long period.   You need frequent blood draws for lab tests.   You need dialysis.  Implanted ports are usually placed in the chest area, but they can also be placed in the upper arm, the abdomen, or the leg. An implanted port has two main parts:   Reservoir. The reservoir is round and will appear as a small, raised area under your skin. The reservoir is the  part where a needle is inserted to give medicines or draw blood.   Catheter. The catheter is a thin, flexible tube that extends from the reservoir. The catheter is placed into a large vein. Medicine that is inserted into the reservoir goes into the catheter and then into the vein.  HOW WILL I CARE FOR MY INCISION SITE? Do not get the incision site wet. Bathe or shower as directed by your health care provider.  HOW IS MY PORT ACCESSED? Special steps must be taken to access the port:   Before the port is accessed, a numbing cream can be placed on the skin. This helps numb the skin over the port site.   Your health care provider uses a sterile technique to access the port.  Your health care provider must put on a mask and sterile gloves.  The skin over your port is cleaned carefully with an antiseptic and allowed to dry.  The port is gently pinched between sterile gloves, and a needle is inserted into the port.  Only "non-coring" port needles should be used to access the port. Once the port is accessed, a blood return should be checked. This helps ensure that the port is in the vein and is not clogged.   If your port needs to remain accessed for a constant infusion, a clear (transparent) bandage will be placed over the needle site. The bandage and needle will need to be changed every week, or as directed by your health care provider.  Keep the bandage covering the needle clean and dry. Do not get it wet. Follow your health care provider's instructions on how to take a shower or bath while the port is accessed.   If your port does not need to stay accessed, no bandage is needed over the port.  WHAT IS FLUSHING? Flushing helps keep the port from getting clogged. Follow your health care provider's instructions on how and when to flush the port. Ports are usually flushed with saline solution or a medicine called heparin. The need for flushing will depend on how the port is used.   If the  port is used for intermittent medicines or blood draws, the port will need to be flushed:   After medicines have been given.   After blood has been drawn.   As part of routine maintenance.   If a constant infusion is running, the port may not need to be flushed.  HOW LONG WILL MY PORT STAY IMPLANTED? The port can stay in for as long as your health care provider thinks it is needed. When it is time for the port to come out, surgery will be done to remove it. The procedure is similar to the one performed when the port was put in.  WHEN SHOULD I SEEK IMMEDIATE MEDICAL CARE? When you have an implanted port, you should seek immediate medical care if:   You notice a bad smell coming from the incision site.   You have swelling, redness, or drainage at the incision site.   You have more swelling or pain at the port site or the surrounding area.   You have a fever that is not controlled with medicine.   This information is not intended to replace advice given to you by your health care provider. Make sure you discuss any questions you have with your health care provider.   Document Released: 03/16/2005 Document Revised: 01/04/2013 Document Reviewed: 11/21/2012 Elsevier Interactive Patient Education [employer].

## 2015-12-25 NOTE — Progress Notes (Signed)
Nutrition follow-up completed with patient during infusion for esophageal cancer. Weight is stable and documented as 176.7 pounds. Patient continues to tolerate oral diet along with oral nutrition supplements. Patient reports he has suffered from constipation.  However, he is using prune juice and MiraLAX with good results.  Nutrition diagnosis: Unintended weight loss improved.  Intervention: Recommended patient continue strategies for adequate calories and protein including oral nutrition supplements as needed. Provided second complimentary case of Ensure Plus. Teach back method used.  Monitoring, evaluation, goals: Patient will tolerate adequate calories and protein to minimize weight loss throughout treatment.  Next visit: Wednesday, October 4 during infusion.  **Disclaimer: This note was dictated with voice recognition software. Similar sounding words can inadvertently be transcribed and this note may contain transcription errors which may not have been corrected upon publication of note.**

## 2015-12-25 NOTE — Progress Notes (Signed)
[facility] OFFICE PROGRESS NOTE   Diagnosis:  Esophageal squamous cell carcinoma Oncology History   Presented with dysphagia and odynophagia with 20-25 pounds of weight loss in about 3-4 months  Esophageal cancer ([place])   Staging form: Esophagus - Adenocarcinoma, AJCC 7th Edition   - Clinical stage from 11/13/2015: Stage IIIB (T3, N2, M0, G2) - Signed by [name], MD on 12/11/2015     Esophageal cancer ([place])   11/13/2015 Procedure    UPPER ENDOSCOPY per Dr. Collene Mares: Large fungating, friable bleeding mass in middle third of esophagus, 22cm from incisors and extended to 27cm. Non-obstructing.     11/13/2015 Pathology Results    Invasive squamous cell carcinoma; moderately differentiated     11/13/2015 Imaging    CT ABD/PELVIS: IMPRESSION:No evidence of metastatic disease in the abdomen or pelvis. 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 Aortoiliac atherosclerosis. Small bilateral inguinal hernias containing fat the     11/22/2015 Initial Diagnosis    Esophageal cancer ([place])     12/03/2015 Imaging    PET scan showed a long segment of hypermetabolic thickening in the mid esophagus consistent with esophageal carcinoma. No clear evidence of pedis tunnel node metastasis or distant metastasis.     12/10/2015 -  Radiation Therapy    Neoadjuvant radiation to his esophageal cancer     12/10/2015 -  Chemotherapy    Neoadjuvant weekly carboplatin AUC 2, and Taxol 45 mg/m, with concurrent radiation      INTERVAL HISTORY:   Juan [name] returns as scheduled. He continues radiation. He completed week 2 Taxol/carboplatin 12/18/2015. He was seen in the emergency department on 12/19/2015 for insomnia and altered mental status. He was seen by psychiatry. The psychiatrist felt that steroids likely exacerbated some underlying issues. He was felt to be stable for discharge from a psychiatric standpoint with close outpatient follow-up.  He had a single episode of  nausea following the most recent chemotherapy. No mouth sores. He is intermittent leg constipated and takes a laxative as needed. No numbness or tingling in his hands or feet. He has had no further problems with insomnia or behavioral change. His wife confirms this.  Objective:  Vital signs in last 24 hours:  Blood pressure 131/72, pulse 63, temperature 97.9 F (36.6 C), temperature source Oral, resp. rate 18, height 5\' 9"  (1.753 m), weight 176 lb 11.2 oz (80.2 kg), SpO2 100 %.    HEENT: No thrush or ulcers. Resp: Lungs clear bilaterally. Cardio: Regular rate and rhythm. GI: Abdomen soft and nontender. No hepatomegaly. Vascular: No leg edema. Port-A-Cath without erythema.   Lab Results:  Lab Results  Component Value Date   WBC 2.8 (L) 12/25/2015   HGB 10.6 (L) 12/25/2015   HCT 34.1 (L) 12/25/2015   MCV 72.8 (L) 12/25/2015   PLT 257 12/25/2015   NEUTROABS 1.9 12/25/2015    Imaging:  Ir US Guide Vasc Access Right  Result Date: 12/24/2015 CLINICAL DATA:  Esophageal carcinoma, needs durable venous access for chemotherapy regimen EXAM: TUNNELED PORT CATHETER PLACEMENT WITH ULTRASOUND AND FLUOROSCOPIC GUIDANCE FLUOROSCOPY TIME:  0.2 minute, 37  uGym2 DAP ANESTHESIA/SEDATION: Intravenous Fentanyl and Versed were administered as conscious sedation during continuous monitoring of the patient's level of consciousness and physiological / cardiorespiratory status by the radiology RN, with a total moderate sedation time of 18 minutes. TECHNIQUE: The procedure, risks, benefits, and alternatives were explained to the patient. Questions regarding the procedure were encouraged and answered. The patient understands and consents to the procedure. As antibiotic  prophylaxis, cefazolin 2 g was ordered pre-procedure and administered intravenously within one hour of incision. Patency of the right IJ vein was confirmed with ultrasound with image documentation. An appropriate skin site was determined. Skin  site was marked. Region was prepped using maximum barrier technique including cap and mask, sterile gown, sterile gloves, large sterile sheet, and Chlorhexidine as cutaneous antisepsis. The region was infiltrated locally with 1% lidocaine. Under real-time ultrasound guidance, the right IJ vein was accessed with a 21 gauge micropuncture needle; the needle tip within the vein was confirmed with ultrasound image documentation. Needle was exchanged over a 018 guidewire for transitional dilator which allowed passage of the [facility] wire into the IVC. Over this, the transitional dilator was exchanged for a 5 [place] MPA catheter. A small incision was made on the right anterior chest wall and a subcutaneous pocket fashioned. The power-injectable port was positioned and its catheter tunneled to the right IJ dermatotomy site. The MPA catheter was exchanged over an Amplatz wire for a peel-away sheath, through which the port catheter, which had been trimmed to the appropriate length, was advanced and positioned under fluoroscopy with its tip at the cavoatrial junction. Spot chest radiograph confirms good catheter position and no pneumothorax. The pocket was closed with deep interrupted and subcuticular continuous 3-0 Monocryl sutures. The port was flushed per protocol. The incisions were covered with Dermabond then covered with a sterile dressing. COMPLICATIONS: COMPLICATIONS None immediate IMPRESSION: Technically successful right IJ power-injectable port catheter placement. Ready for routine use. Electronically Signed   By: Lucrezia Europe M.D.   On: 12/24/2015 16:26   Ir Fluoro Guide Port Insertion Right  Result Date: 12/24/2015 CLINICAL DATA:  Esophageal carcinoma, needs durable venous access for chemotherapy regimen EXAM: TUNNELED PORT CATHETER PLACEMENT WITH ULTRASOUND AND FLUOROSCOPIC GUIDANCE FLUOROSCOPY TIME:  0.2 minute, 37  uGym2 DAP ANESTHESIA/SEDATION: Intravenous Fentanyl and Versed were administered as conscious  sedation during continuous monitoring of the patient's level of consciousness and physiological / cardiorespiratory status by the radiology RN, with a total moderate sedation time of 18 minutes. TECHNIQUE: The procedure, risks, benefits, and alternatives were explained to the patient. Questions regarding the procedure were encouraged and answered. The patient understands and consents to the procedure. As antibiotic prophylaxis, cefazolin 2 g was ordered pre-procedure and administered intravenously within one hour of incision. Patency of the right IJ vein was confirmed with ultrasound with image documentation. An appropriate skin site was determined. Skin site was marked. Region was prepped using maximum barrier technique including cap and mask, sterile gown, sterile gloves, large sterile sheet, and Chlorhexidine as cutaneous antisepsis. The region was infiltrated locally with 1% lidocaine. Under real-time ultrasound guidance, the right IJ vein was accessed with a 21 gauge micropuncture needle; the needle tip within the vein was confirmed with ultrasound image documentation. Needle was exchanged over a 018 guidewire for transitional dilator which allowed passage of the [facility] wire into the IVC. Over this, the transitional dilator was exchanged for a 5 [place] MPA catheter. A small incision was made on the right anterior chest wall and a subcutaneous pocket fashioned. The power-injectable port was positioned and its catheter tunneled to the right IJ dermatotomy site. The MPA catheter was exchanged over an Amplatz wire for a peel-away sheath, through which the port catheter, which had been trimmed to the appropriate length, was advanced and positioned under fluoroscopy with its tip at the cavoatrial junction. Spot chest radiograph confirms good catheter position and no pneumothorax. The pocket was closed with deep  interrupted and subcuticular continuous 3-0 Monocryl sutures. The port was flushed per protocol. The  incisions were covered with Dermabond then covered with a sterile dressing. COMPLICATIONS: COMPLICATIONS None immediate IMPRESSION: Technically successful right IJ power-injectable port catheter placement. Ready for routine use. Electronically Signed   By: Lucrezia Europe M.D.   On: 12/24/2015 16:26    Medications: I have reviewed the patient's current medications.  Assessment/Plan: 1. Esophageal cancer, squamous cell carcinoma, cT3N1-2M0, stage IIIB; initiation of radiation 12/09/2015 and concurrent weekly chemotherapy 12/11/2015 2. Dysphagia secondary to #1 3. Odynophagia secondary to #1 4. Insomnia/behavioral change likely related to the steroid premedication with week 2 of the chemotherapy. Now followed by psychiatry. 5. Port-A-Cath placement 12/24/2015   Disposition: Juan [name] appears stable. He continues radiation. He has completed 2 weekly treatments with Taxol/carboplatin. Following the week 2 treatment he developed insomnia and behavioral change. He was evaluated in the emergency department by psychiatry. The steroid premedication was felt to have likely exacerbated some underlying "issues". Mental status today appears at baseline.  Dr. Burr Medico has discontinued Decadron and Taxol from the regimen and substituted Abraxane. Continuation of carboplatin as prior. We reviewed potential toxicities associate with Abraxane including myelosuppression, allergic reaction, nausea, mouth sores, diarrhea or constipation, neuropathy, myalgia/arthralgia. He is agreeable to proceed.  He will return in one week for a follow-up visit. He will contact the office in the interim with any problems.  Plan reviewed with Dr. Burr Medico.     Ned Card ANP/GNP-BC   12/25/2015  10:53 AM

## 2015-12-26 ENCOUNTER — Ambulatory Visit
Admission: RE | Admit: 2015-12-26 | Discharge: 2015-12-26 | Disposition: A | Discharge status: Home / Self Care | Payer: Managed Care, Other (non HMO) | Source: Ambulatory Visit | Attending: Radiation Oncology | Admitting: Radiation Oncology

## 2015-12-26 ENCOUNTER — Encounter: Payer: Managed Care, Other (non HMO) | Admitting: Nutrition

## 2015-12-26 ENCOUNTER — Ambulatory Visit: Payer: Managed Care, Other (non HMO)

## 2015-12-26 DIAGNOSIS — C154 Malignant neoplasm of middle third of esophagus: Secondary | ICD-10-CM | POA: Diagnosis not present

## 2015-12-26 DIAGNOSIS — M546 Pain in thoracic spine: Secondary | ICD-10-CM | POA: Diagnosis not present

## 2015-12-26 DIAGNOSIS — Z800 Family history of malignant neoplasm of digestive organs: Secondary | ICD-10-CM | POA: Diagnosis not present

## 2015-12-26 DIAGNOSIS — R1310 Dysphagia, unspecified: Secondary | ICD-10-CM | POA: Diagnosis not present

## 2015-12-26 DIAGNOSIS — R634 Abnormal weight loss: Secondary | ICD-10-CM | POA: Diagnosis not present

## 2015-12-26 DIAGNOSIS — K219 Gastro-esophageal reflux disease without esophagitis: Secondary | ICD-10-CM | POA: Diagnosis not present

## 2015-12-26 DIAGNOSIS — Z7984 Long term (current) use of oral hypoglycemic drugs: Secondary | ICD-10-CM | POA: Diagnosis not present

## 2015-12-26 DIAGNOSIS — R001 Bradycardia, unspecified: Secondary | ICD-10-CM | POA: Diagnosis not present

## 2015-12-26 DIAGNOSIS — Z7982 Long term (current) use of aspirin: Secondary | ICD-10-CM | POA: Diagnosis not present

## 2015-12-26 NOTE — Addendum Note (Signed)
Encounter addended by: Doreen Beam, RN on: 12/26/2015  7:57 AM<BR>    Actions taken: Charge Capture section accepted

## 2015-12-27 ENCOUNTER — Encounter: Payer: Self-pay | Admitting: Radiation Oncology

## 2015-12-27 ENCOUNTER — Ambulatory Visit
Admission: RE | Admit: 2015-12-27 | Discharge: 2015-12-27 | Disposition: A | Discharge status: Home / Self Care | Payer: Managed Care, Other (non HMO) | Source: Ambulatory Visit | Attending: Radiation Oncology | Admitting: Radiation Oncology

## 2015-12-27 ENCOUNTER — Ambulatory Visit: Payer: Managed Care, Other (non HMO)

## 2015-12-27 ENCOUNTER — Inpatient Hospital Stay
Admission: RE | Admit: 2015-12-27 | Discharge: 2015-12-27 | Disposition: A | Discharge status: Home / Self Care | Payer: Self-pay | Source: Ambulatory Visit | Attending: Radiation Oncology | Admitting: Radiation Oncology

## 2015-12-27 VITALS — BP 120/83 | HR 63 | Temp 97.8°F | Resp 18 | Ht 69.0 in | Wt 174.6 lb

## 2015-12-27 DIAGNOSIS — R634 Abnormal weight loss: Secondary | ICD-10-CM | POA: Diagnosis not present

## 2015-12-27 DIAGNOSIS — Z800 Family history of malignant neoplasm of digestive organs: Secondary | ICD-10-CM | POA: Diagnosis not present

## 2015-12-27 DIAGNOSIS — C154 Malignant neoplasm of middle third of esophagus: Secondary | ICD-10-CM | POA: Diagnosis not present

## 2015-12-27 DIAGNOSIS — R1310 Dysphagia, unspecified: Secondary | ICD-10-CM | POA: Diagnosis not present

## 2015-12-27 DIAGNOSIS — R001 Bradycardia, unspecified: Secondary | ICD-10-CM | POA: Diagnosis not present

## 2015-12-27 DIAGNOSIS — K219 Gastro-esophageal reflux disease without esophagitis: Secondary | ICD-10-CM | POA: Diagnosis not present

## 2015-12-27 DIAGNOSIS — M546 Pain in thoracic spine: Secondary | ICD-10-CM | POA: Diagnosis not present

## 2015-12-27 DIAGNOSIS — Z7984 Long term (current) use of oral hypoglycemic drugs: Secondary | ICD-10-CM | POA: Diagnosis not present

## 2015-12-27 DIAGNOSIS — Z7982 Long term (current) use of aspirin: Secondary | ICD-10-CM | POA: Diagnosis not present

## 2015-12-27 NOTE — Progress Notes (Signed)
Weekly rad tx esophagus 13/25 completed,  c/o burning in upper chest/throat and aggressive behavior from steroids , Feeling better takeing prilosec for burning in throat after eating.  Eats soft foods, drinks plenty water.  Drinking Ensure 2-4 bottles a day. Having some swallowing problems when eating.  Reports fatigue all during the day.   Wt Readings from Last 3 Encounters:  12/27/15 174 lb 9.6 oz (79.2 kg)  12/25/15 176 lb 11.2 oz (80.2 kg)  12/20/15 176 lb 6.4 oz (80 kg)  BP 120/83 (BP Location: Right Arm, Patient Position: Sitting, Cuff Size: Normal)   Pulse 63   Temp 97.8 F (36.6 C) (Oral)   Resp 18   Ht 5\' 9"  (1.753 m)   Wt 174 lb 9.6 oz (79.2 kg)   SpO2 100%   BMI 25.78 kg/m  .

## 2015-12-29 NOTE — Progress Notes (Signed)
Department of Radiation Oncology  Phone:  [phone] Fax:        [phone]  Weekly Treatment Note    Name: Juan [name] Date: 12/29/2015 MRN: [id number] DOB: Aug 05, 1940   Diagnosis:     ICD-9-CM ICD-10-CM   1. Malignant neoplasm of middle third of esophagus ([place]) 150.4 C15.4      Current dose: 26 Gy  Current fraction: 13   MEDICATIONS: Current Outpatient Prescriptions  Medication Sig Dispense Refill  . albuterol (PROVENTIL HFA;VENTOLIN HFA) 108 (90 BASE) MCG/ACT inhaler Inhale 2 puffs into the lungs every 4 (four) hours as needed for wheezing. 1 Inhaler 0  . amitriptyline (ELAVIL) 10 MG tablet Take 10 mg by mouth at bedtime. 11/16/15 - Take 1 tablet at Bedtime x 1 Week, then increase to 2 tablets Once Daily Orally- 30 days    . aspirin EC 81 MG tablet Take 81 mg by mouth daily.    Marland Kitchen atorvastatin (LIPITOR) 20 MG tablet Take 20 mg by mouth at bedtime.     . metFORMIN (GLUCOPHAGE) 500 MG tablet Take 500 mg by mouth 2 (two) times daily with a meal.     . omeprazole (PRILOSEC) 40 MG capsule Take 40 mg by mouth daily.     . ondansetron (ZOFRAN) 8 MG tablet Take 1 tablet (8 mg total) by mouth 2 (two) times daily as needed for refractory nausea / vomiting. Start on day 3 after chemo. 30 tablet 1  . oxyCODONE-acetaminophen (PERCOCET/ROXICET) 5-325 MG tablet Take 1-2 tablets by mouth every 6 (six) hours as needed for severe pain. 120 tablet 0  . prochlorperazine (COMPAZINE) 10 MG tablet Take 1 tablet (10 mg total) by mouth every 6 (six) hours as needed (Nausea or vomiting). 30 tablet 1  . sucralfate (CARAFATE) 1 g tablet Take 1 tablet (1 g total) by mouth 4 (four) times daily. 120 tablet 2  . Wound Dressings (SONAFINE) Apply 1 application topically daily.     No current facility-administered medications for this encounter.      ALLERGIES: Review of patient's allergies indicates no known allergies.   LABORATORY DATA:  Lab Results  Component Value Date   WBC 2.8 (L)  12/25/2015   HGB 10.6 (L) 12/25/2015   HCT 34.1 (L) 12/25/2015   MCV 72.8 (L) 12/25/2015   PLT 257 12/25/2015   Lab Results  Component Value Date   NA 137 12/25/2015   K 4.1 12/25/2015   CL 105 12/24/2015   CO2 24 12/25/2015   Lab Results  Component Value Date   ALT 12 12/25/2015   AST 12 12/25/2015   ALKPHOS 90 12/25/2015   BILITOT 0.40 12/25/2015     NARRATIVE: Juan [name] was seen today for weekly treatment management. The chart was checked and the patient's films were reviewed.  Weekly rad tx esophagus 13/25 completed,  c/o burning in upper chest/throat and aggressive behavior from steroids , Feeling better takeing prilosec for burning in throat after eating.  Eats soft foods, drinks plenty water.  Drinking Ensure 2-4 bottles a day. Having some swallowing problems when eating.  Reports fatigue all during the day.   Wt Readings from Last 3 Encounters:  12/27/15 174 lb 9.6 oz (79.2 kg)  12/25/15 176 lb 11.2 oz (80.2 kg)  12/20/15 176 lb 6.4 oz (80 kg)  BP 120/83 (BP Location: Right Arm, Patient Position: Sitting, Cuff Size: Normal)   Pulse 63   Temp 97.8 F (36.6 C) (Oral)   Resp 18   Ht 5\' 9"  (  1.753 m)   Wt 174 lb 9.6 oz (79.2 kg)   SpO2 100%   BMI 25.78 kg/m  .  PHYSICAL EXAMINATION: height is 5\' 9"  (1.753 m) and weight is 174 lb 9.6 oz (79.2 kg). His oral temperature is 97.8 F (36.6 C). His blood pressure is 120/83 and his pulse is 63. His respiration is 18 and oxygen saturation is 100%.        ASSESSMENT: The patient is doing satisfactorily with treatment.  The patient is doing quite well with his treatment currently. Some ongoing difficulty swallowing and the patient is taking Carafate. No significant worsening regarding this over the last week.  PLAN: We will continue with the patient's radiation treatment as planned.

## 2015-12-30 ENCOUNTER — Ambulatory Visit: Payer: Managed Care, Other (non HMO)

## 2015-12-30 ENCOUNTER — Ambulatory Visit
Admission: RE | Admit: 2015-12-30 | Discharge: 2015-12-30 | Disposition: A | Discharge status: Home / Self Care | Payer: Managed Care, Other (non HMO) | Source: Ambulatory Visit | Attending: Radiation Oncology | Admitting: Radiation Oncology

## 2015-12-30 DIAGNOSIS — Z7982 Long term (current) use of aspirin: Secondary | ICD-10-CM | POA: Diagnosis not present

## 2015-12-30 DIAGNOSIS — R634 Abnormal weight loss: Secondary | ICD-10-CM | POA: Diagnosis not present

## 2015-12-30 DIAGNOSIS — M546 Pain in thoracic spine: Secondary | ICD-10-CM | POA: Diagnosis not present

## 2015-12-30 DIAGNOSIS — K219 Gastro-esophageal reflux disease without esophagitis: Secondary | ICD-10-CM | POA: Diagnosis not present

## 2015-12-30 DIAGNOSIS — R001 Bradycardia, unspecified: Secondary | ICD-10-CM | POA: Diagnosis not present

## 2015-12-30 DIAGNOSIS — Z800 Family history of malignant neoplasm of digestive organs: Secondary | ICD-10-CM | POA: Diagnosis not present

## 2015-12-30 DIAGNOSIS — Z7984 Long term (current) use of oral hypoglycemic drugs: Secondary | ICD-10-CM | POA: Diagnosis not present

## 2015-12-30 DIAGNOSIS — R1310 Dysphagia, unspecified: Secondary | ICD-10-CM | POA: Diagnosis not present

## 2015-12-30 DIAGNOSIS — C154 Malignant neoplasm of middle third of esophagus: Secondary | ICD-10-CM | POA: Diagnosis not present

## 2015-12-31 ENCOUNTER — Ambulatory Visit: Payer: Managed Care, Other (non HMO)

## 2015-12-31 ENCOUNTER — Ambulatory Visit
Admission: RE | Admit: 2015-12-31 | Discharge: 2015-12-31 | Disposition: A | Discharge status: Home / Self Care | Payer: Managed Care, Other (non HMO) | Source: Ambulatory Visit | Attending: Radiation Oncology | Admitting: Radiation Oncology

## 2015-12-31 ENCOUNTER — Encounter: Payer: Managed Care, Other (non HMO) | Admitting: Cardiothoracic Surgery

## 2015-12-31 DIAGNOSIS — Z800 Family history of malignant neoplasm of digestive organs: Secondary | ICD-10-CM | POA: Diagnosis not present

## 2015-12-31 DIAGNOSIS — R001 Bradycardia, unspecified: Secondary | ICD-10-CM | POA: Diagnosis not present

## 2015-12-31 DIAGNOSIS — K219 Gastro-esophageal reflux disease without esophagitis: Secondary | ICD-10-CM | POA: Diagnosis not present

## 2015-12-31 DIAGNOSIS — Z7982 Long term (current) use of aspirin: Secondary | ICD-10-CM | POA: Diagnosis not present

## 2015-12-31 DIAGNOSIS — R1310 Dysphagia, unspecified: Secondary | ICD-10-CM | POA: Diagnosis not present

## 2015-12-31 DIAGNOSIS — C154 Malignant neoplasm of middle third of esophagus: Secondary | ICD-10-CM | POA: Diagnosis not present

## 2015-12-31 DIAGNOSIS — Z7984 Long term (current) use of oral hypoglycemic drugs: Secondary | ICD-10-CM | POA: Diagnosis not present

## 2015-12-31 DIAGNOSIS — R634 Abnormal weight loss: Secondary | ICD-10-CM | POA: Diagnosis not present

## 2015-12-31 DIAGNOSIS — M546 Pain in thoracic spine: Secondary | ICD-10-CM | POA: Diagnosis not present

## 2016-01-01 ENCOUNTER — Ambulatory Visit (HOSPITAL_BASED_OUTPATIENT_CLINIC_OR_DEPARTMENT_OTHER): Payer: Managed Care, Other (non HMO) | Admitting: Hematology

## 2016-01-01 ENCOUNTER — Ambulatory Visit (HOSPITAL_BASED_OUTPATIENT_CLINIC_OR_DEPARTMENT_OTHER): Payer: Managed Care, Other (non HMO)

## 2016-01-01 ENCOUNTER — Ambulatory Visit: Payer: Managed Care, Other (non HMO)

## 2016-01-01 ENCOUNTER — Ambulatory Visit: Payer: Managed Care, Other (non HMO) | Admitting: Nutrition

## 2016-01-01 ENCOUNTER — Encounter: Payer: Self-pay | Admitting: Hematology

## 2016-01-01 ENCOUNTER — Telehealth: Payer: Self-pay | Admitting: Hematology

## 2016-01-01 ENCOUNTER — Ambulatory Visit
Admission: RE | Admit: 2016-01-01 | Discharge: 2016-01-01 | Disposition: A | Discharge status: Home / Self Care | Payer: Managed Care, Other (non HMO) | Source: Ambulatory Visit | Attending: Radiation Oncology | Admitting: Radiation Oncology

## 2016-01-01 ENCOUNTER — Other Ambulatory Visit: Payer: Self-pay | Admitting: *Deleted

## 2016-01-01 ENCOUNTER — Other Ambulatory Visit (HOSPITAL_BASED_OUTPATIENT_CLINIC_OR_DEPARTMENT_OTHER): Payer: Managed Care, Other (non HMO)

## 2016-01-01 VITALS — BP 111/83 | HR 77 | Temp 97.8°F | Resp 18 | Ht 69.0 in | Wt 171.6 lb

## 2016-01-01 DIAGNOSIS — F4325 Adjustment disorder with mixed disturbance of emotions and conduct: Secondary | ICD-10-CM

## 2016-01-01 DIAGNOSIS — Z5111 Encounter for antineoplastic chemotherapy: Secondary | ICD-10-CM

## 2016-01-01 DIAGNOSIS — K219 Gastro-esophageal reflux disease without esophagitis: Secondary | ICD-10-CM | POA: Diagnosis not present

## 2016-01-01 DIAGNOSIS — R634 Abnormal weight loss: Secondary | ICD-10-CM | POA: Diagnosis not present

## 2016-01-01 DIAGNOSIS — Z7982 Long term (current) use of aspirin: Secondary | ICD-10-CM | POA: Diagnosis not present

## 2016-01-01 DIAGNOSIS — Z7984 Long term (current) use of oral hypoglycemic drugs: Secondary | ICD-10-CM | POA: Diagnosis not present

## 2016-01-01 DIAGNOSIS — R1310 Dysphagia, unspecified: Secondary | ICD-10-CM | POA: Diagnosis not present

## 2016-01-01 DIAGNOSIS — C154 Malignant neoplasm of middle third of esophagus: Secondary | ICD-10-CM

## 2016-01-01 DIAGNOSIS — M546 Pain in thoracic spine: Secondary | ICD-10-CM | POA: Diagnosis not present

## 2016-01-01 DIAGNOSIS — Z800 Family history of malignant neoplasm of digestive organs: Secondary | ICD-10-CM | POA: Diagnosis not present

## 2016-01-01 DIAGNOSIS — R001 Bradycardia, unspecified: Secondary | ICD-10-CM | POA: Diagnosis not present

## 2016-01-01 DIAGNOSIS — Z95828 Presence of other vascular implants and grafts: Secondary | ICD-10-CM

## 2016-01-01 LAB — CBC WITH DIFFERENTIAL/PLATELET
BASO%: 0.4 % (ref 0.0–2.0)
Basophils Absolute: 0 10*3/uL (ref 0.0–0.1)
EOS%: 2 % (ref 0.0–7.0)
Eosinophils Absolute: 0.1 10*3/uL (ref 0.0–0.5)
HCT: 35 % — ABNORMAL LOW (ref 38.4–49.9)
HGB: 11.2 g/dL — ABNORMAL LOW (ref 13.0–17.1)
LYMPH%: 13.4 % — ABNORMAL LOW (ref 14.0–49.0)
MCH: 23.1 pg — ABNORMAL LOW (ref 27.2–33.4)
MCHC: 32 g/dL (ref 32.0–36.0)
MCV: 72.3 fL — ABNORMAL LOW (ref 79.3–98.0)
MONO#: 0.2 10*3/uL (ref 0.1–0.9)
MONO%: 9.4 % (ref 0.0–14.0)
NEUT#: 1.9 10*3/uL (ref 1.5–6.5)
NEUT%: 74.8 % (ref 39.0–75.0)
Platelets: 223 10*3/uL (ref 140–400)
RBC: 4.84 10*6/uL (ref 4.20–5.82)
RDW: 15 % — ABNORMAL HIGH (ref 11.0–14.6)
WBC: 2.5 10*3/uL — ABNORMAL LOW (ref 4.0–10.3)
lymph#: 0.3 10*3/uL — ABNORMAL LOW (ref 0.9–3.3)

## 2016-01-01 LAB — COMPREHENSIVE METABOLIC PANEL
ALT: 10 U/L (ref 0–55)
AST: 11 U/L (ref 5–34)
Albumin: 3.4 g/dL — ABNORMAL LOW (ref 3.5–5.0)
Alkaline Phosphatase: 81 U/L (ref 40–150)
Anion Gap: 7 mEq/L (ref 3–11)
BUN: 14.3 mg/dL (ref 7.0–26.0)
CO2: 26 mEq/L (ref 22–29)
Calcium: 9.4 mg/dL (ref 8.4–10.4)
Chloride: 105 mEq/L (ref 98–109)
Creatinine: 0.8 mg/dL (ref 0.7–1.3)
EGFR: >90 mL/min/{1.73_m2} (ref >90)
Glucose: 103 mg/dl (ref 70–140)
Potassium: 3.9 mEq/L (ref 3.5–5.1)
Sodium: 139 mEq/L (ref 136–145)
Total Bilirubin: 0.37 mg/dL (ref 0.20–1.20)
Total Protein: 7.1 g/dL (ref 6.4–8.3)

## 2016-01-01 MED ORDER — PALONOSETRON HCL INJECTION 0.25 MG/5ML
INTRAVENOUS | Status: AC
  Filled 2016-01-01: qty 5

## 2016-01-01 MED ORDER — PACLITAXEL PROTEIN-BOUND CHEMO INJECTION 100 MG
45.0000 mg/m2 | Freq: Once | INTRAVENOUS | Status: AC
  Administered 2016-01-01: 100 mg via INTRAVENOUS
  Filled 2016-01-01: qty 20

## 2016-01-01 MED ORDER — SODIUM CHLORIDE 0.9% FLUSH
10.0000 mL | INTRAVENOUS | Status: DC | PRN
  Administered 2016-01-01: 10 mL
  Filled 2016-01-01: qty 10

## 2016-01-01 MED ORDER — PALONOSETRON HCL INJECTION 0.25 MG/5ML
0.2500 mg | Freq: Once | INTRAVENOUS | Status: AC
  Administered 2016-01-01: 0.25 mg via INTRAVENOUS

## 2016-01-01 MED ORDER — HEPARIN SOD (PORK) LOCK FLUSH 100 UNIT/ML IV SOLN
500.0000 [IU] | Freq: Once | INTRAVENOUS | Status: AC | PRN
  Administered 2016-01-01: 500 [IU]
  Filled 2016-01-01: qty 5

## 2016-01-01 MED ORDER — LIDOCAINE-PRILOCAINE 2.5-2.5 % EX CREA: 1.0000 "application " | TOPICAL_CREAM | CUTANEOUS | 1 refills | Status: DC | PRN

## 2016-01-01 MED ORDER — SODIUM CHLORIDE 0.9 % IJ SOLN
10.0000 mL | INTRAMUSCULAR | Status: DC | PRN
  Administered 2016-01-01: 10 mL via INTRAVENOUS
  Filled 2016-01-01: qty 10

## 2016-01-01 MED ORDER — SODIUM CHLORIDE 0.9 % IV SOLN
Freq: Once | INTRAVENOUS | Status: AC
  Administered 2016-01-01: 12:00:00 via INTRAVENOUS

## 2016-01-01 MED ORDER — SODIUM CHLORIDE 0.9 % IV SOLN
193.4000 mg | Freq: Once | INTRAVENOUS | Status: AC
  Administered 2016-01-01: 190 mg via INTRAVENOUS
  Filled 2016-01-01: qty 19

## 2016-01-01 NOTE — Patient Instructions (Signed)
[facility] Discharge Instructions for Patients Receiving Chemotherapy  Today you received the following chemotherapy agents: Abraxane and Carboplatin.  To help prevent nausea and vomiting after your treatment, we encourage you to take your nausea medication as directed.   If you develop nausea and vomiting that is not controlled by your nausea medication, call the clinic.   BELOW ARE SYMPTOMS THAT SHOULD BE REPORTED IMMEDIATELY:  *FEVER GREATER THAN 100.5 F  *CHILLS WITH OR WITHOUT FEVER  NAUSEA AND VOMITING THAT IS NOT CONTROLLED WITH YOUR NAUSEA MEDICATION  *UNUSUAL SHORTNESS OF BREATH  *UNUSUAL BRUISING OR BLEEDING  TENDERNESS IN MOUTH AND THROAT WITH OR WITHOUT PRESENCE OF ULCERS  *URINARY PROBLEMS  *BOWEL PROBLEMS  UNUSUAL RASH Items with * indicate a potential emergency and should be followed up as soon as possible.  Feel free to call the clinic you have any questions or concerns. The clinic phone number is [phone].  Please show the CHEMO ALERT CARD at check-in to the Emergency Department and triage nurse.   

## 2016-01-01 NOTE — Telephone Encounter (Signed)
NO 10/4 LOS/ORDERS/REFERRALS

## 2016-01-01 NOTE — Progress Notes (Signed)
[place]  Telephone:([phone]) [phone] Fax:(336) [phone]  Clinic follow Up Note   Patient Care Team: Juan [name]) Juan Heck, MD (Inactive) as PCP - General (Internal Medicine) 01/01/2016   CHIEF COMPLAINTS:  Follow up esophageal squamous cell carcinoma  Oncology History   Presented with dysphagia and odynophagia with 20-25 pounds of weight loss in about 3-4 months  Esophageal cancer ([place])   Staging form: Esophagus - Adenocarcinoma, AJCC 7th Edition   - Clinical stage from 11/13/2015: Stage IIIB (T3, N2, M0, G2) - Signed by Juan Merle, MD on 12/11/2015      Esophageal cancer ([place])   11/13/2015 Procedure    UPPER ENDOSCOPY per Dr. Collene [name]: Large fungating, friable bleeding mass in middle third of esophagus, 22cm from incisors and extended to 27cm. Non-obstructing.      11/13/2015 Pathology Results    Invasive squamous cell carcinoma; moderately differentiated      11/13/2015 Imaging    CT ABD/PELVIS: IMPRESSION:No evidence of metastatic disease in the abdomen or pelvis. 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 Aortoiliac atherosclerosis. Small bilateral inguinal hernias containing fat the      11/22/2015 Initial Diagnosis    Esophageal cancer ([place])     12/03/2015 Imaging    PET scan showed a long segment of hypermetabolic thickening in the mid esophagus consistent with esophageal carcinoma. No clear evidence of pedis tunnel node metastasis or distant metastasis.      12/10/2015 -  Radiation Therapy    Neoadjuvant radiation to his esophageal cancer      12/10/2015 -  Chemotherapy    Neoadjuvant weekly carboplatin AUC 2, and Taxol 45 mg/m, with concurrent radiation. He developed steroid-induced psychosis, and Taxol was changed to Abraxane to avoid premedication with steroids.       HISTORY OF PRESENTING ILLNESS:  Juan [name] 75 y.o. male is here because of His recently diagnosed esophageal cancer. He is accompanied by his wife to my clinic today.  He  started having sore throat and dysphagia about 4-5 months ago, and was seen by PCP, had 2 course antibiotics but is symptom progressed. He was referred to gastroenterologist Dr. Suezanne [name], and underwent EGD on 11/13/2015, which showed a large fungating, friable bleeding mass in the middle third of esophagus, 22 cm to 27 cm from incisors, non-obstructing. The biopsy showed squamous cell carcinoma. His CT abdomen and pelvis was negative for metastatic disease.  He has lost 20lbs, he is on soft diet and liquids, his pain is about 7/10 pain with swallowing. He also has chest pain , intermittent, it lasts about a few minutes, no nausea or vomiting, his bowel movement is normal. He denies melena or hematochezia. He is able to function and tolerated light activities at home.  CURRENT THERAPY: concurrent chemotherapy and radiation, with weekly carboplatin AUC 2 and Taxol (changed to Abraxane from weeks 3) 45 mg/m, started on 12/10/2015  INTERIM HISTORY: Juan [name] returns for follow-up and 4th week of chemotherapy and irradiation. Taxol was changed to Abraxane last week due to his steroids induced psychosis. He tolerated carbo and Abraxane without premedications steroids well. He still has dysphagia and mild odynophagia, his appetite is moderate to low, he drinks ensure 3-4 bottles a day, still lost about 5 pounds in the past 2 weeks. He has occasional cough, no sputum production or fever. No other new complaints.  MEDICAL HISTORY:  Past Medical History:  Diagnosis Date  . Esophageal cancer ([place])   . Reflux     SURGICAL HISTORY: Past Surgical  History:  Procedure Laterality Date  . EUS N/A 12/05/2015   Procedure: UPPER ENDOSCOPIC ULTRASOUND (EUS) LINEAR;  Surgeon: Juan Ada, MD;  Location: [facility];  Service: Endoscopy;  Laterality: N/A;  . HERNIA REPAIR    . IR GENERIC HISTORICAL  12/24/2015   IR FLUORO GUIDE PORT INSERTION RIGHT 12/24/2015 Juan Cleveland, MD [facility]  . IR GENERIC HISTORICAL   12/24/2015   IR US GUIDE VASC ACCESS RIGHT 12/24/2015 Juan Cleveland, MD [facility]  . SHOULDER ARTHROSCOPY W/ SUPERIOR LABRAL ANTERIOR POSTERIOR LESION REPAIR      SOCIAL HISTORY: Social History   Social History  . Marital status: Married    Spouse name: N/A  . Number of children: N/A  . Years of education: N/A   Occupational History  . Not on file.   Social History Main Topics  . Smoking status: Former Smoker    Packs/day: 2.00    Years: 40.00    Quit date: 03/31/1999  . Smokeless tobacco: Never Used  . Alcohol use No     Comment: weekend binge drinking for 30-40 years, quit in 2001  . Drug use: No  . Sexual activity: Not on file   Other Topics Concern  . 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 Colon cancer Father 23  . Colon cancer Brother 30    ALLERGIES:  has No Known Allergies.  MEDICATIONS:  Current Outpatient Prescriptions  Medication Sig Dispense Refill  . albuterol (PROVENTIL HFA;VENTOLIN HFA) 108 (90 BASE) MCG/ACT inhaler Inhale 2 puffs into the lungs every 4 (four) hours as needed for wheezing. 1 Inhaler 0  . amitriptyline (ELAVIL) 10 MG tablet Take 10 mg by mouth at bedtime. 11/16/15 - Take 1 tablet at Bedtime x 1 Week, then increase to 2 tablets Once Daily Orally- 30 days    . aspirin EC 81 MG tablet Take 81 mg by mouth daily.    Marland Kitchen atorvastatin (LIPITOR) 20 MG tablet Take 20 mg by mouth at bedtime.     . metFORMIN (GLUCOPHAGE) 500 MG tablet Take 500 mg by mouth 2 (two) times daily with a meal.     . omeprazole (PRILOSEC) 40 MG capsule Take 40 mg by mouth daily.     . ondansetron (ZOFRAN) 8 MG tablet Take 1 tablet (8 mg total) by mouth 2 (two) times daily as needed for refractory nausea / vomiting. Start on day  3 after chemo. 30 tablet 1  . oxyCODONE-acetaminophen (PERCOCET/ROXICET) 5-325 MG tablet Take 1-2 tablets by mouth every 6 (six) hours as needed for severe pain. 120 tablet 0  . prochlorperazine (COMPAZINE) 10 MG tablet Take 1 tablet (10 mg total) by mouth every 6 (six) hours as needed (Nausea or vomiting). 30 tablet 1  . sucralfate (CARAFATE) 1 g tablet Take 1 tablet (1 g total) by mouth 4 (four) times daily. 120 tablet 2  . Wound Dressings (SONAFINE) Apply 1 application topically daily.     No current facility-administered medications for this visit.     REVIEW OF SYSTEMS:   Constitutional: Denies fevers, chills or abnormal night sweats Eyes: Denies blurriness of vision, double vision or watery eyes Ears, nose, mouth, throat, and face: Denies mucositis or sore throat Respiratory:  Denies cough, dyspnea or wheezes Cardiovascular: Denies palpitation, chest discomfort or lower extremity swelling Gastrointestinal:  Denies nausea, heartburn or change in bowel habits Skin: Denies abnormal skin rashes Lymphatics: Denies new lymphadenopathy or easy bruising Neurological:Denies numbness, tingling or new weaknesses Behavioral/Psych: Mood is stable, no new changes  All other systems were reviewed with the patient and are negative.  PHYSICAL EXAMINATION: ECOG PERFORMANCE STATUS: 1 - Symptomatic but completely ambulatory  Vitals:   01/01/16 1013  BP: 111/83  Pulse: 77  Resp: 18  Temp: 97.8 F (36.6 C)   Filed Weights   01/01/16 1013  Weight: 171 lb 9.6 oz (77.8 kg)    GENERAL:alert, no distress and comfortable SKIN: skin color, texture, turgor are normal, no rashes or significant lesions EYES: normal, conjunctiva are pink and non-injected, sclera clear OROPHARYNX:no exudate, no erythema and lips, buccal mucosa, and tongue normal  NECK: supple, thyroid normal size, non-tender, without nodularity LYMPH:  no palpable lymphadenopathy in the cervical, axillary or inguinal LUNGS: clear to  auscultation and percussion with normal breathing effort HEART: regular rate & rhythm and no murmurs and no lower extremity edema ABDOMEN:abdomen soft, non-tender and normal bowel sounds Musculoskeletal:no cyanosis of digits and no clubbing  PSYCH: alert & oriented x 3 with fluent speech NEURO: no focal motor/sensory deficits  LABORATORY DATA:  I have reviewed the data as listed CBC Latest Ref Rng & Units 01/01/2016 12/25/2015 12/24/2015  WBC 4.0 - 10.3 10e3/uL 2.5(L) 2.8(L) 3.2(L)  Hemoglobin 13.0 - 17.1 g/dL 11.2(L) 10.6(L) 11.2(L)  Hematocrit 38.4 - 49.9 % 35.0(L) 34.1(L) 35.4(L)  Platelets 140 - 400 10e3/uL 223 257 287   CMP Latest Ref Rng & Units 01/01/2016 12/25/2015 12/24/2015  Glucose 70 - 140 mg/dl 103 106 131(H)  BUN 7.0 - 26.0 mg/dL 14.3 9.1 9  Creatinine 0.7 - 1.3 mg/dL 0.8 0.7 0.67  Sodium 136 - 145 mEq/L 139 137 137  Potassium 3.5 - 5.1 mEq/L 3.9 4.1 4.6  Chloride 101 - 111 mmol/L - - 105  CO2 22 - 29 mEq/L 26 24 24   Calcium 8.4 - 10.4 mg/dL 9.4 9.0 8.8(L)  Total Protein 6.4 - 8.3 g/dL 7.1 6.8 -  Total Bilirubin 0.20 - 1.20 mg/dL 0.37 0.40 -  Alkaline Phos 40 - 150 U/L 81 90 -  AST 5 - 34 U/L 11 12 -  ALT 0 - 55 U/L 10 12 -   PATHOLOGY REPORT  Final microscopic diagnosis Esophagus, 0.2 cm to 27 cm, biopsy -Invasive squamous cell carcinoma, moderately differentiated   RADIOGRAPHIC STUDIES: I have personally reviewed the radiological images as listed and agreed with the findings in the report. Dg Chest 2 View  Result Date: 12/04/2015 CLINICAL DATA:  75 year old male with intermittent midsternal chest pain. Recent diagnosis of esophageal cancer. EXAM: CHEST  2 VIEW COMPARISON:  PETCT dated 12/03/2015 FINDINGS: The lungs are clear. There is no pleural effusion or pneumothorax. The cardiac silhouette is within normal limits. No acute osseous pathology. IMPRESSION: No active cardiopulmonary disease. Electronically Signed   By: Anner Crete M.D.   On: 12/04/2015 23:51    Ct Head Wo Contrast  Result Date: 12/19/2015 CLINICAL DATA:  Patient with history of psychiatric issues. History of esophageal carcinoma. EXAM: CT HEAD WITHOUT CONTRAST TECHNIQUE: Contiguous axial images were obtained from the base of the skull through the vertex without intravenous contrast. COMPARISON:  Brain CT 01/05/2014 FINDINGS: Brain: Ventricles and sulci are appropriate for patient's age. No evidence for acute cortically based infarct, intracranial hemorrhage, mass lesion  or mass-effect. Vascular: No hyperdense vessel or unexpected calcification. Skull: Normal. Negative for fracture or focal lesion. Sinuses/Orbits: No acute finding. Other: None. IMPRESSION: No acute intracranial process. Electronically Signed   By: Lovey Newcomer M.D.   On: 12/19/2015 15:51   Nm Pet Image Initial (pi) Skull Base To Thigh  Result Date: 12/03/2015 CLINICAL DATA:  Initial treatment strategy for esophageal cancer of the middle third of the esophagus. EXAM: NUCLEAR MEDICINE PET SKULL BASE TO THIGH TECHNIQUE: 12.7 mCi F-18 FDG was injected intravenously. Full-ring PET imaging was performed from the skull base to thigh after the radiotracer. CT data was obtained and used for attenuation correction and anatomic localization. FASTING BLOOD GLUCOSE:  Value: 11 mg/dl COMPARISON:  CT abdomen 11/13/2015 FINDINGS: NECK No hypermetabolic lymph nodes in the neck. CHEST A 3 cm segment of circumferential esophageal wall thickening at the level of the carina with intense metabolic activity (SUV max equal 18.2). Single wall thickness measures 1.5 cm High RIGHT paratracheal lymph node measures 7 mm (image 964, series 4) with low metabolic activity the hypermetabolic activity (SUV max equaled 2.1). No hypermetabolic paraesophageal lymph nodes. No suspicious pulmonary nodules. ABDOMEN/PELVIS No hypermetabolic gastrohepatic ligament nodes. No abnormal metabolic activity in the liver. No hypermetabolic retroperitoneal or pelvic lymph nodes.  Physiologic activity noted within the colon. SKELETON No focal hypermetabolic activity to suggest skeletal metastasis. IMPRESSION: 1. Long segment of hypermetabolic thickening in the mid esophagus consistent with esophageal carcinoma. 2. No clear evidence of mediastinal nodal metastasis. Single high RIGHT paratracheal lymph node with low metabolic activity 3. No evidence of liver metastasis or upper abdominal nodal metastasis Electronically Signed   By: Suzy Bouchard M.D.   On: 12/03/2015 10:53   Ir US Guide Vasc Access Right  Result Date: 12/24/2015 CLINICAL DATA:  Esophageal carcinoma, needs durable venous access for chemotherapy regimen EXAM: TUNNELED PORT CATHETER PLACEMENT WITH ULTRASOUND AND FLUOROSCOPIC GUIDANCE FLUOROSCOPY TIME:  0.2 minute, 37  uGym2 DAP ANESTHESIA/SEDATION: Intravenous Fentanyl and Versed were administered as conscious sedation during continuous monitoring of the patient's level of consciousness and physiological / cardiorespiratory status by the radiology RN, with a total moderate sedation time of 18 minutes. TECHNIQUE: The procedure, risks, benefits, and alternatives were explained to the patient. Questions regarding the procedure were encouraged and answered. The patient understands and consents to the procedure. As antibiotic prophylaxis, cefazolin 2 g was ordered pre-procedure and administered intravenously within one hour of incision. Patency of the right IJ vein was confirmed with ultrasound with image documentation. An appropriate skin site was determined. Skin site was marked. Region was prepped using maximum barrier technique including cap and mask, sterile gown, sterile gloves, large sterile sheet, and Chlorhexidine as cutaneous antisepsis. The region was infiltrated locally with 1% lidocaine. Under real-time ultrasound guidance, the right IJ vein was accessed with a 21 gauge micropuncture needle; the needle tip within the vein was confirmed with ultrasound image  documentation. Needle was exchanged over a 018 guidewire for transitional dilator which allowed passage of the [facility] wire into the IVC. Over this, the transitional dilator was exchanged for a 5 [place] MPA catheter. A small incision was made on the right anterior chest wall and a subcutaneous pocket fashioned. The power-injectable port was positioned and its catheter tunneled to the right IJ dermatotomy site. The MPA catheter was exchanged over an Amplatz wire for a peel-away sheath, through which the port catheter, which had been trimmed to the appropriate length, was advanced and positioned under fluoroscopy with its tip at  the cavoatrial junction. Spot chest radiograph confirms good catheter position and no pneumothorax. The pocket was closed with deep interrupted and subcuticular continuous 3-0 Monocryl sutures. The port was flushed per protocol. The incisions were covered with Dermabond then covered with a sterile dressing. COMPLICATIONS: COMPLICATIONS None immediate IMPRESSION: Technically successful right IJ power-injectable port catheter placement. Ready for routine use. Electronically Signed   By: Lucrezia Europe M.D.   On: 12/24/2015 16:26   Ir Fluoro Guide Port Insertion Right  Result Date: 12/24/2015 CLINICAL DATA:  Esophageal carcinoma, needs durable venous access for chemotherapy regimen EXAM: TUNNELED PORT CATHETER PLACEMENT WITH ULTRASOUND AND FLUOROSCOPIC GUIDANCE FLUOROSCOPY TIME:  0.2 minute, 37  uGym2 DAP ANESTHESIA/SEDATION: Intravenous Fentanyl and Versed were administered as conscious sedation during continuous monitoring of the patient's level of consciousness and physiological / cardiorespiratory status by the radiology RN, with a total moderate sedation time of 18 minutes. TECHNIQUE: The procedure, risks, benefits, and alternatives were explained to the patient. Questions regarding the procedure were encouraged and answered. The patient understands and consents to the procedure. As  antibiotic prophylaxis, cefazolin 2 g was ordered pre-procedure and administered intravenously within one hour of incision. Patency of the right IJ vein was confirmed with ultrasound with image documentation. An appropriate skin site was determined. Skin site was marked. Region was prepped using maximum barrier technique including cap and mask, sterile gown, sterile gloves, large sterile sheet, and Chlorhexidine as cutaneous antisepsis. The region was infiltrated locally with 1% lidocaine. Under real-time ultrasound guidance, the right IJ vein was accessed with a 21 gauge micropuncture needle; the needle tip within the vein was confirmed with ultrasound image documentation. Needle was exchanged over a 018 guidewire for transitional dilator which allowed passage of the [facility] wire into the IVC. Over this, the transitional dilator was exchanged for a 5 [place] MPA catheter. A small incision was made on the right anterior chest wall and a subcutaneous pocket fashioned. The power-injectable port was positioned and its catheter tunneled to the right IJ dermatotomy site. The MPA catheter was exchanged over an Amplatz wire for a peel-away sheath, through which the port catheter, which had been trimmed to the appropriate length, was advanced and positioned under fluoroscopy with its tip at the cavoatrial junction. Spot chest radiograph confirms good catheter position and no pneumothorax. The pocket was closed with deep interrupted and subcuticular continuous 3-0 Monocryl sutures. The port was flushed per protocol. The incisions were covered with Dermabond then covered with a sterile dressing. COMPLICATIONS: COMPLICATIONS None immediate IMPRESSION: Technically successful right IJ power-injectable port catheter placement. Ready for routine use. Electronically Signed   By: Lucrezia Europe M.D.   On: 12/24/2015 16:26   EGD 11/13/2015 Dr. Collene [name]  -A large, fungating, friable mass with bleeding was found in the mid third of the  esophagus, 22 cm from the incisors and extended to 27 cm. The mass was nonobstructing and a circumferential, biopsy was taken. -Normal stomach -Normal proximal small bowel  Colonoscopy 11/13/2015 Dr. Collene [name] -Diverticulosis in the entire exam: -No specimens collected  EUS 12/05/2015 Malignant esophageal tumor was found in the upper third of the esophagus and in the middle third of the esophagus. - A mass was found in the cervical esophagus and in the thoracic esophagus. The diagnosis is squamous cell carcinoma. This was staged T3 N1/N2 Mx by endosonographic criteria. - No specimens collected.   ASSESSMENT & PLAN: 75 year old gentleman, without significant past medical history except acid reflux, presented with progressive dysphagia and odynophagia and weight  loss.  1. Esophageal cancer, squamous cell carcinoma, cT3N1-2M0, stage IIIB -I reviewed his EGD findings, biopsy results, and the CT scan findings with patient and his wife in great details -I dicussed staging PET scan findings with patient and his wife, the mid esophageal mass is hypermetabolic, no distant metastasis. -EUS revealed a T3 lesion, N1 vs N2, locally advanced disease. -We recommend him to have neoadjuvant radiation and chemotherapy with weekly carboplatin and Taxol -Taxol was changed to Abraxane due to cirrhosis induced psychosis -He is still tolerating treatment moderately well, but still losing weight. I encouraged him to drink more initial.  2. Dysphagia  -He is able to tolerate a soft diet and drinks fluids adequately -follow up with dietician Pamala Hurry  -He is still loosing weight   3. Odynophagia -Dr. Lisbeth Renshaw has prescribed Percocet as needed for his pain, he will continue , he does not use much -I will adjust his pain medication if needed during his treatment   4. Steroids induced psychosis -Resolved now. We'll avoid steroids in the future.  Recommendations -Lab results reviewed, adequate for treatment, we'll  proceed 4th week chemotherapy carbo and Abraxane today -We'll see him weekly  All questions were answered. The patient knows to call the clinic with any problems, questions or concerns.  I spent 20 minutes counseling the patient face to face. The total time spent in the appointment was 25 minutes and more than 50% was on counseling.     Juan Merle, MD 01/01/2016

## 2016-01-01 NOTE — Progress Notes (Signed)
Nutrition follow-up completed with patient during infusion for esophageal cancer. Weight has decreased and was documented as 171.6 pounds down from 176.7 pounds September 27. Patient does endorse 5 pound weight loss and appears concerned. Reports he is consuming oral nutrition supplements.  Nutrition diagnosis: Unintended weight loss continues.  Intervention: Recommended patient increase oral nutrition supplements 4 times a day along with small meals and snacks. Educated patient on the importance of adequate nutrition for healing. Questions were answered.  Teach back method used.  Monitoring, evaluation, goals:  Patient will increase calories and protein to minimize weight loss.  Next visit: Wednesday, October 11, during infusion.

## 2016-01-02 ENCOUNTER — Encounter: Payer: Self-pay | Admitting: Cardiothoracic Surgery

## 2016-01-02 ENCOUNTER — Institutional Professional Consult (permissible substitution) (INDEPENDENT_AMBULATORY_CARE_PROVIDER_SITE_OTHER): Payer: Managed Care, Other (non HMO) | Admitting: Cardiothoracic Surgery

## 2016-01-02 ENCOUNTER — Ambulatory Visit: Payer: Managed Care, Other (non HMO)

## 2016-01-02 ENCOUNTER — Ambulatory Visit
Admission: RE | Admit: 2016-01-02 | Discharge: 2016-01-02 | Disposition: A | Discharge status: Home / Self Care | Payer: Managed Care, Other (non HMO) | Source: Ambulatory Visit | Attending: Radiation Oncology | Admitting: Radiation Oncology

## 2016-01-02 ENCOUNTER — Encounter: Payer: Managed Care, Other (non HMO) | Admitting: Nutrition

## 2016-01-02 VITALS — BP 125/80 | HR 86 | Resp 20 | Ht 69.0 in | Wt 172.6 lb

## 2016-01-02 DIAGNOSIS — C154 Malignant neoplasm of middle third of esophagus: Secondary | ICD-10-CM | POA: Diagnosis not present

## 2016-01-02 DIAGNOSIS — C159 Malignant neoplasm of esophagus, unspecified: Secondary | ICD-10-CM

## 2016-01-02 DIAGNOSIS — R634 Abnormal weight loss: Secondary | ICD-10-CM | POA: Diagnosis not present

## 2016-01-02 DIAGNOSIS — R1310 Dysphagia, unspecified: Secondary | ICD-10-CM | POA: Diagnosis not present

## 2016-01-02 DIAGNOSIS — Z7982 Long term (current) use of aspirin: Secondary | ICD-10-CM | POA: Diagnosis not present

## 2016-01-02 DIAGNOSIS — Z7984 Long term (current) use of oral hypoglycemic drugs: Secondary | ICD-10-CM | POA: Diagnosis not present

## 2016-01-02 DIAGNOSIS — K219 Gastro-esophageal reflux disease without esophagitis: Secondary | ICD-10-CM | POA: Diagnosis not present

## 2016-01-02 DIAGNOSIS — R001 Bradycardia, unspecified: Secondary | ICD-10-CM | POA: Diagnosis not present

## 2016-01-02 DIAGNOSIS — M546 Pain in thoracic spine: Secondary | ICD-10-CM | POA: Diagnosis not present

## 2016-01-02 DIAGNOSIS — Z800 Family history of malignant neoplasm of digestive organs: Secondary | ICD-10-CM | POA: Diagnosis not present

## 2016-01-02 NOTE — Progress Notes (Signed)
[name]       [name] [address]             [phone]                    Juan [name] [facility] Record [id number] Date of Birth: 05-08-1940  Referring: [name], MD Primary Care: [name]) [name], MD (Inactive)  Chief Complaint:    Chief Complaint  Patient presents with  . Esophageal Cancer    CT ABD/Pelvis 11/13/15, PET Scan 12/03/15, Upper Endo 12/05/15  Esophageal cancer ([place])   Staging form: Esophagus - Adenocarcinoma, AJCC 7th Edition   - Clinical stage from 11/13/2015: Stage IIIB (T3, N2, M0, G2) - Signed by [name], MD on 12/11/2015  History of Present Illness:    Juan [name] 75 y.o. male is seen in the office  today for Esophageal cancer-squamous cell -but I do not have confirmation by pathology report number. The patient notes a "sore throat and painful swallowing for more than a year, associated with weight loss from 196-171. While living in [place] he had been told that he had Barrett's esophagus and had had episodic endoscopies, but I have no reports at this. Recently he saw Dr. Collene Mares for a follow-up colonoscopy, at that time a upper GI endoscopy was performed. A second endoscopy was performed with esophageal ultrasound as noted below :  A mass was found in the cervical esophagus and in the thoracic esophagus. The diagnosis is squamous cell carcinoma. This was staged T3 N1/N2 Mx by endosonographic criteria.  From the descriptions of endoscopy is not clear to me if this patient has extensive squamous cell carcinoma involving the cervical and mid thoracic esophagus or 2 separate lesions.   . He was seen in the emergency department on 12/19/2015 for insomnia and altered mental status.   He's currently undergoing radiation and chemotherapy he notes that his radiation should be completed by October 20.  CURRENT THERAPY: concurrent chemotherapy and radiation, with weekly carboplatin AUC 2 and Taxol 45 mg/m, started on  12/10/2015 Current Activity/ Functional Status:  Patient is independent with mobility/ambulation, transfers, ADL's, IADL's.   Zubrod Score: At the time of surgery this patient's most appropriate activity status/level should be described as: []     0    Normal activity, no symptoms [x]     1    Restricted in physical strenuous activity but ambulatory, able to do out light work []     2    Ambulatory and capable of self care, unable to do work activities, up and about               >50 % of waking hours                              []     3    Only limited self care, in bed greater than 50% of waking hours []     4    Completely disabled, no self care, confined to bed or chair []     5    Moribund   Past Medical History:  Diagnosis Date  . Esophageal cancer ([place])   . Reflux     Past Surgical History:  Procedure Laterality Date  . EUS N/A 12/05/2015   Procedure: UPPER ENDOSCOPIC ULTRASOUND (EUS) LINEAR;  Surgeon: Carol Ada, MD;  Location: [facility];  Service: Endoscopy;  Laterality: N/A;  .  HERNIA REPAIR    . IR GENERIC HISTORICAL  12/24/2015   IR FLUORO GUIDE PORT INSERTION RIGHT 12/24/2015 Arne Cleveland, MD [facility]  . IR GENERIC HISTORICAL  12/24/2015   IR US GUIDE VASC ACCESS RIGHT 12/24/2015 Arne Cleveland, MD [facility]  . SHOULDER ARTHROSCOPY W/ SUPERIOR LABRAL ANTERIOR POSTERIOR LESION REPAIR      Family History  Problem Relation Age of Onset  . Colon cancer Father 42  . Colon cancer Brother 25    Social History   Social History  . Marital status: Married    Spouse name: N/A  . Number of children: N/A  . Years of education: N/A   Occupational History  . Not on file.   Social History Main Topics  . Smoking status: Former Smoker    Packs/day: 2.00    Years: 40.00    Quit date: 03/31/1999  . Smokeless tobacco: Never Used  . Alcohol use No     Comment: weekend binge drinking for 30-40 years, quit in 2001  . Drug use: No  . Sexual activity: Not on file    Other Topics Concern  . 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 Former Smoker  . Packs/day: 2.00  . Years: 40.00  . Quit date: 03/31/1999  Smokeless Tobacco  . Never Used    History  Alcohol Use No    Comment: weekend binge drinking for 30-40 years, quit in 2001     No Known Allergies  Current Outpatient Prescriptions  Medication Sig Dispense Refill  . albuterol (PROVENTIL HFA;VENTOLIN HFA) 108 (90 BASE) MCG/ACT inhaler Inhale 2 puffs into the lungs every 4 (four) hours as needed for wheezing. 1 Inhaler 0  . amitriptyline (ELAVIL) 10 MG tablet Take 10 mg by mouth at bedtime. 11/16/15 - Take 1 tablet at Bedtime x 1 Week, then increase to 2 tablets Once Daily Orally- 30 days    . aspirin EC 81 MG tablet Take 81 mg by mouth daily.    Marland Kitchen atorvastatin (LIPITOR) 20 MG tablet Take 20 mg by mouth at bedtime.     . lidocaine-prilocaine (EMLA) cream Apply 1 application topically as needed. 30 g 1  . metFORMIN (GLUCOPHAGE) 500 MG tablet Take 500 mg by mouth 2 (two) times daily with a meal.     . omeprazole (PRILOSEC) 40 MG capsule Take 40 mg by mouth daily.     . ondansetron (ZOFRAN) 8 MG tablet Take 1 tablet (8 mg total) by mouth 2 (two) times daily as needed for refractory nausea / vomiting. Start on day 3 after chemo. 30 tablet 1  . oxyCODONE-acetaminophen (PERCOCET/ROXICET) 5-325 MG tablet Take 1-2 tablets by mouth every 6 (six) hours as needed for severe pain. 120 tablet 0  . prochlorperazine (COMPAZINE) 10 MG tablet Take 1 tablet (10 mg total) by mouth every 6 (six) hours as needed (Nausea or vomiting). 30 tablet 1  . sucralfate (CARAFATE) 1 g tablet Take 1 tablet (1 g total) by mouth 4 (four) times daily. 120 tablet 2  . Wound Dressings (SONAFINE) Apply 1 application topically daily.     No current facility-administered medications for this visit.       Review of  Systems:     Cardiac Review of Systems: Y or N  Chest Pain [ y   ]  Resting SOB [  y ]  Exertional SOB  Blue.Reese  ]  Orthopnea Blue.Reese  ]   Pedal Edema [  n ]    Palpitations Florencio.Farrier  ] Syncope  Florencio.Farrier  ]   Presyncope [ y  ]  General Review of Systems: [Y] = yes [  ]=no Constitional: recent weight change Blue.Reese  ];  Wt loss over the last 3 months Blue.Reese   ] anorexia [  ]; fatigue [ y ]; nausea [ y ]; night sweats [  ]; fever [  ]; or chills [  ];          Dental: poor dentition[  ]; Last Dentist visit:   Eye : blurred vision [  ]; diplopia [   ]; vision changes [  ];  Amaurosis fugax[  ]; Resp: cough [  ];  wheezing[  ];  hemoptysis[ n ]; shortness of breath[  ]; paroxysmal nocturnal dyspnea[  ]; dyspnea on exertion[  ]; or orthopnea[  ];  GI:  gallstones[  ], vomiting[  ];  dysphagia[y  ]; melena[ n ];  hematochezia [n  ]; heartburn[  ];   Hx of  Colonoscopy[  ]; GU: kidney stones [  ]; hematuria[  ];   dysuria [  ];  nocturia[  ];  history of     obstruction [  ]; urinary frequency [  ]             Skin: rash, swelling[  ];, hair loss[  ];  peripheral edema[  ];  or itching[  ]; Musculosketetal: myalgias[  ];  joint swelling[  ];  joint erythema[  ];  joint pain[  ];  back pain[  ];  Heme/Lymph: bruising[  ];  bleeding[  ];  anemia[  ];  Neuro: TIA[  ];  headaches[  ];  stroke[  ];  vertigo[  ];  seizures[ n ];   paresthesias[  ];  difficulty walking[  ];  Psych:depression[  ]; anxiety[  ];  Endocrine: diabetes[  ];  thyroid dysfunction[  ];  Immunizations: Flu up to date [ y ]; Pneumococcal up to date [ n ];  Other:  Physical Exam: BP 125/80 (BP Location: Right Arm, Patient Position: Sitting, Cuff Size: Normal)   Pulse 86   Resp 20   Ht 5\' 9"  (1.753 m)   Wt 172 lb 9.6 oz (78.3 kg)   SpO2 91% Comment: RA  BMI 25.49 kg/m   PHYSICAL EXAMINATION: General appearance: alert and cooperative Head: Normocephalic, without obvious abnormality, atraumatic Neck: no adenopathy, no carotid bruit, no JVD, supple,  symmetrical, trachea midline and thyroid not enlarged, symmetric, no tenderness/mass/nodules Lymph nodes: Cervical, supraclavicular, and axillary nodes normal. Resp: clear to auscultation bilaterally Back: symmetric, no curvature. ROM normal. No CVA tenderness. Cardio: regular rate and rhythm, S1, S2 normal, no murmur, click, rub or gallop GI: soft, non-tender; bowel sounds normal; no masses,  no organomegaly Extremities: extremities normal, atraumatic, no cyanosis or edema and Homans sign is negative, no sign of DVT Neurologic: Grossly normal  Diagnostic Studies & Laboratory data:     Recent Radiology Findings:   Dg Chest 2 View  Result Date: 12/04/2015 CLINICAL DATA:  75 year old male with intermittent midsternal chest pain. Recent diagnosis of esophageal cancer. EXAM: CHEST  2 VIEW COMPARISON:  PETCT dated 12/03/2015 FINDINGS: The lungs are clear. There is no pleural effusion or pneumothorax. The cardiac silhouette is within normal limits. No acute osseous pathology. IMPRESSION: No active cardiopulmonary disease. Electronically Signed   By:  Anner Crete M.D.   On: 12/04/2015 23:51   Ct Head Wo Contrast  Result Date: 12/19/2015 CLINICAL DATA:  Patient with history of psychiatric issues. History of esophageal carcinoma. EXAM: CT HEAD WITHOUT CONTRAST TECHNIQUE: Contiguous axial images were obtained from the base of the skull through the vertex without intravenous contrast. COMPARISON:  Brain CT 01/05/2014 FINDINGS: Brain: Ventricles and sulci are appropriate for patient's age. No evidence for acute cortically based infarct, intracranial hemorrhage, mass lesion or mass-effect. Vascular: No hyperdense vessel or unexpected calcification. Skull: Normal. Negative for fracture or focal lesion. Sinuses/Orbits: No acute finding. Other: None. IMPRESSION: No acute intracranial process. Electronically Signed   By: Lovey Newcomer M.D.   On: 12/19/2015 15:51   Nm Pet Image Initial (pi) Skull Base To  Thigh  Result Date: 12/03/2015 CLINICAL DATA:  Initial treatment strategy for esophageal cancer of the middle third of the esophagus. EXAM: NUCLEAR MEDICINE PET SKULL BASE TO THIGH TECHNIQUE: 12.7 mCi F-18 FDG was injected intravenously. Full-ring PET imaging was performed from the skull base to thigh after the radiotracer. CT data was obtained and used for attenuation correction and anatomic localization. FASTING BLOOD GLUCOSE:  Value: 11 mg/dl COMPARISON:  CT abdomen 11/13/2015 FINDINGS: NECK No hypermetabolic lymph nodes in the neck. CHEST A 3 cm segment of circumferential esophageal wall thickening at the level of the carina with intense metabolic activity (SUV max equal 18.2). Single wall thickness measures 1.5 cm High RIGHT paratracheal lymph node measures 7 mm (image 964, series 4) with low metabolic activity the hypermetabolic activity (SUV max equaled 2.1). No hypermetabolic paraesophageal lymph nodes. No suspicious pulmonary nodules. ABDOMEN/PELVIS No hypermetabolic gastrohepatic ligament nodes. No abnormal metabolic activity in the liver. No hypermetabolic retroperitoneal or pelvic lymph nodes. Physiologic activity noted within the colon. SKELETON No focal hypermetabolic activity to suggest skeletal metastasis. IMPRESSION: 1. Long segment of hypermetabolic thickening in the mid esophagus consistent with esophageal carcinoma. 2. No clear evidence of mediastinal nodal metastasis. Single high RIGHT paratracheal lymph node with low metabolic activity 3. No evidence of liver metastasis or upper abdominal nodal metastasis Electronically Signed   By: Suzy Bouchard M.D.   On: 12/03/2015 10:53   Ir US Guide Vasc Access Right  Result Date: 12/24/2015 CLINICAL DATA:  Esophageal carcinoma, needs durable venous access for chemotherapy regimen EXAM: TUNNELED PORT CATHETER PLACEMENT WITH ULTRASOUND AND FLUOROSCOPIC GUIDANCE FLUOROSCOPY TIME:  0.2 minute, 37  uGym2 DAP ANESTHESIA/SEDATION: Intravenous Fentanyl  and Versed were administered as conscious sedation during continuous monitoring of the patient's level of consciousness and physiological / cardiorespiratory status by the radiology RN, with a total moderate sedation time of 18 minutes. TECHNIQUE: The procedure, risks, benefits, and alternatives were explained to the patient. Questions regarding the procedure were encouraged and answered. The patient understands and consents to the procedure. As antibiotic prophylaxis, cefazolin 2 g was ordered pre-procedure and administered intravenously within one hour of incision. Patency of the right IJ vein was confirmed with ultrasound with image documentation. An appropriate skin site was determined. Skin site was marked. Region was prepped using maximum barrier technique including cap and mask, sterile gown, sterile gloves, large sterile sheet, and Chlorhexidine as cutaneous antisepsis. The region was infiltrated locally with 1% lidocaine. Under real-time ultrasound guidance, the right IJ vein was accessed with a 21 gauge micropuncture needle; the needle tip within the vein was confirmed with ultrasound image documentation. Needle was exchanged over a 018 guidewire for transitional dilator which allowed passage of the [facility] wire into the  IVC. Over this, the transitional dilator was exchanged for a 5 [place] MPA catheter. A small incision was made on the right anterior chest wall and a subcutaneous pocket fashioned. The power-injectable port was positioned and its catheter tunneled to the right IJ dermatotomy site. The MPA catheter was exchanged over an Amplatz wire for a peel-away sheath, through which the port catheter, which had been trimmed to the appropriate length, was advanced and positioned under fluoroscopy with its tip at the cavoatrial junction. Spot chest radiograph confirms good catheter position and no pneumothorax. The pocket was closed with deep interrupted and subcuticular continuous 3-0 Monocryl sutures. The  port was flushed per protocol. The incisions were covered with Dermabond then covered with a sterile dressing. COMPLICATIONS: COMPLICATIONS None immediate IMPRESSION: Technically successful right IJ power-injectable port catheter placement. Ready for routine use. Electronically Signed   By: Lucrezia Europe M.D.   On: 12/24/2015 16:26      I have independently reviewed the above radiologic studies.  Recent Lab Findings: Lab Results  Component Value Date   WBC 2.5 (L) 01/01/2016   HGB 11.2 (L) 01/01/2016   HCT 35.0 (L) 01/01/2016   PLT 223 01/01/2016   GLUCOSE 103 01/01/2016   ALT 10 01/01/2016   AST 11 01/01/2016   NA 139 01/01/2016   K 3.9 01/01/2016   CL 105 12/24/2015   CREATININE 0.8 01/01/2016   BUN 14.3 01/01/2016   CO2 26 01/01/2016   INR 1.02 12/24/2015   Findings: A large, ulcerating mass with no bleeding and stigmata of recent bleeding was found in the upper third of the esophagus and in the middle third of the esophagus. The mass was non-obstructing and partially circumferential (involving one-half of the lumen circumference). Endosonographic Finding A mass was found in the cervical esophagus and in the thoracic esophagus. The procedure was difficult to perform as a result of patient tolerance issues combined with rapid HR and severe HTN. In the upper to mid esophagus a large protruding mass was identified. The surrounding mucosa was friable and ulcerated. There was some initial difficulty with passing the radial echoendoscope through this area. Because of the patient's combativeness and the protrusion of the mass I was not able to obtain an accurate measurement of the mass with endoscopic visualization. Evaluation with ultrasound was challenging, but a large eccentric hypoechoic mass with irregular borders was identified. In the proximal portion it appeared to be noncircumfirential, but in the mid portion and distally the lesion eccentrically circumfirential. At the largest  portion of the mass, it measured 12.8 mm in depth. At this point, the mass protruded beyond the adventitia (T3). In the area it was not clear to me if the mass extended or if it was a collection of lymph nodes. A couple of peritumoral lymph nodes were identified just proximal to this area. Evaluation of the Celiac axis was negative for any evidence of lymph nodes. The left lobe of the liver was negative for any suspicious lesions and the left adrenal was normal in appearance. - Malignant esophageal tumor was found in the upper third of the esophagus and in the middle third of the esophagus. - A mass was found in the cervical esophagus and in the thoracic esophagus. The diagnosis is squamous cell carcinoma. This was staged T3 N1/N2 Mx by endosonographic criteria. - No specimens collected  Assessment / Plan:   Ascending the patient and discussed with him the diagnosis and the rule in general that surgery plays in the treatment of  esophageal cancer. Currently its unclear to me from the discrepancies in the notes if the patient has cervical esophageal involvement, and if so this would preclude surgical risk section. He does have history of cardiac disease in notes of valve disorder which is unclassified and not fully evaluated,   I've requested the records from Dr. Youlanda Mighty office, and the original path reports. Before considering surgery he would have to be rescoped and repeat scans. I'm not optimistic that he would be a operative candidate but before making this final decision with see him back after he completes his radiation.       I  spent 40 minutes counseling the patient face to face and 50% or more the  time was spent in counseling and coordination of care. The total time spent in the appointment was 60 minutes.  Grace Isaac MD      [address] [place] [address] Office [phone]   Beeper [phone]  01/02/2016 4:07 PM

## 2016-01-03 ENCOUNTER — Ambulatory Visit
Admission: RE | Admit: 2016-01-03 | Discharge: 2016-01-03 | Disposition: A | Discharge status: Home / Self Care | Payer: Managed Care, Other (non HMO) | Source: Ambulatory Visit | Attending: Radiation Oncology | Admitting: Radiation Oncology

## 2016-01-03 ENCOUNTER — Ambulatory Visit: Payer: Managed Care, Other (non HMO)

## 2016-01-03 ENCOUNTER — Encounter: Payer: Self-pay | Admitting: Radiation Oncology

## 2016-01-03 VITALS — BP 140/88 | HR 65 | Temp 97.8°F | Resp 20 | Wt 173.6 lb

## 2016-01-03 DIAGNOSIS — Z7982 Long term (current) use of aspirin: Secondary | ICD-10-CM | POA: Diagnosis not present

## 2016-01-03 DIAGNOSIS — R634 Abnormal weight loss: Secondary | ICD-10-CM | POA: Diagnosis not present

## 2016-01-03 DIAGNOSIS — M546 Pain in thoracic spine: Secondary | ICD-10-CM | POA: Diagnosis not present

## 2016-01-03 DIAGNOSIS — C154 Malignant neoplasm of middle third of esophagus: Secondary | ICD-10-CM

## 2016-01-03 DIAGNOSIS — Z800 Family history of malignant neoplasm of digestive organs: Secondary | ICD-10-CM | POA: Diagnosis not present

## 2016-01-03 DIAGNOSIS — R1310 Dysphagia, unspecified: Secondary | ICD-10-CM | POA: Diagnosis not present

## 2016-01-03 DIAGNOSIS — K219 Gastro-esophageal reflux disease without esophagitis: Secondary | ICD-10-CM | POA: Diagnosis not present

## 2016-01-03 DIAGNOSIS — Z7984 Long term (current) use of oral hypoglycemic drugs: Secondary | ICD-10-CM | POA: Diagnosis not present

## 2016-01-03 DIAGNOSIS — R001 Bradycardia, unspecified: Secondary | ICD-10-CM | POA: Diagnosis not present

## 2016-01-03 NOTE — Progress Notes (Signed)
Department of Radiation Oncology  Phone:  [phone] Fax:        [phone]  Weekly Treatment Note    Name: Juan [name] Date: 01/03/2016 MRN: [id number] DOB: 30-Mar-1941   Diagnosis:     ICD-9-CM ICD-10-CM   1. Malignant neoplasm of middle third of esophagus ([place]) 150.4 C15.4      Current dose: 36 Gy  Current fraction: 18   MEDICATIONS: Current Outpatient Prescriptions  Medication Sig Dispense Refill  . amitriptyline (ELAVIL) 10 MG tablet Take 10 mg by mouth at bedtime. 11/16/15 - Take 1 tablet at Bedtime x 1 Week, then increase to 2 tablets Once Daily Orally- 30 days    . aspirin EC 81 MG tablet Take 81 mg by mouth daily.    Marland Kitchen atorvastatin (LIPITOR) 20 MG tablet Take 20 mg by mouth at bedtime.     . lidocaine-prilocaine (EMLA) cream Apply 1 application topically as needed. 30 g 1  . metFORMIN (GLUCOPHAGE) 500 MG tablet Take 500 mg by mouth 2 (two) times daily with a meal.     . omeprazole (PRILOSEC) 40 MG capsule Take 40 mg by mouth daily.     . ondansetron (ZOFRAN) 8 MG tablet Take 1 tablet (8 mg total) by mouth 2 (two) times daily as needed for refractory nausea / vomiting. Start on day 3 after chemo. 30 tablet 1  . oxyCODONE-acetaminophen (PERCOCET/ROXICET) 5-325 MG tablet Take 1-2 tablets by mouth every 6 (six) hours as needed for severe pain. 120 tablet 0  . prochlorperazine (COMPAZINE) 10 MG tablet Take 1 tablet (10 mg total) by mouth every 6 (six) hours as needed (Nausea or vomiting). 30 tablet 1  . sucralfate (CARAFATE) 1 g tablet Take 1 tablet (1 g total) by mouth 4 (four) times daily. 120 tablet 2  . Wound Dressings (SONAFINE) Apply 1 application topically daily.    Marland Kitchen albuterol (PROVENTIL HFA;VENTOLIN HFA) 108 (90 BASE) MCG/ACT inhaler Inhale 2 puffs into the lungs every 4 (four) hours as needed for wheezing. (Patient not taking: Reported on 01/03/2016) 1 Inhaler 0   No current facility-administered medications for this encounter.      ALLERGIES: Review of  patient's allergies indicates no known allergies.   LABORATORY DATA:  Lab Results  Component Value Date   WBC 2.5 (L) 01/01/2016   HGB 11.2 (L) 01/01/2016   HCT 35.0 (L) 01/01/2016   MCV 72.3 (L) 01/01/2016   PLT 223 01/01/2016   Lab Results  Component Value Date   NA 139 01/01/2016   K 3.9 01/01/2016   CL 105 12/24/2015   CO2 26 01/01/2016   Lab Results  Component Value Date   ALT 10 01/01/2016   AST 11 01/01/2016   ALKPHOS 81 01/01/2016   BILITOT 0.37 01/01/2016     NARRATIVE: Faizaan Genrich was seen today for weekly treatment management. The chart was checked and the patient's films were reviewed.  Weekly rad txs esophagus 18/25 completed, hyperpigmentation on chest, skin intact, using sonafine cream daily, taks carafte 4x day as directed, eating soft foods, drinking fluids,water, fatigued, slight pain in  Chest area  At times last a few seconds, drinks ensure 2-3x day 5:40 PM. Wt Readings from Last 3 Encounters:  01/03/16 173 lb 9.6 oz (78.7 kg)  01/02/16 172 lb 9.6 oz (78.3 kg)  01/01/16 171 lb 9.6 oz (77.8 kg)   BP 140/88 (BP Location: Right Arm, Patient Position: Sitting, Cuff Size: Normal)   Pulse 65   Temp 97.8 F (36.6 C) (  Oral)   Resp 20   Wt 173 lb 9.6 oz (78.7 kg)   SpO2 100% Comment: room air  BMI 25.64 kg/m   PHYSICAL EXAMINATION: weight is 173 lb 9.6 oz (78.7 kg). His oral temperature is 97.8 F (36.6 C). His blood pressure is 140/88 and his pulse is 65. His respiration is 20 and oxygen saturation is 100%.        ASSESSMENT: The patient is doing satisfactorily with treatment.  PLAN: We will continue with the patient's radiation treatment as planned.

## 2016-01-03 NOTE — Progress Notes (Signed)
Weekly rad txs esophagus 18/25 completed, hyperpigmentation on chest, skin intact, using sonafine cream daily, taks carafte 4x day as directed, eating soft foods, drinking fluids,water, fatigued, slight pain in  Chest area  At times last a few seconds, drinks ensure 2-3x day 4:26 PM. Wt Readings from Last 3 Encounters:  01/03/16 173 lb 9.6 oz (78.7 kg)  01/02/16 172 lb 9.6 oz (78.3 kg)  01/01/16 171 lb 9.6 oz (77.8 kg)   BP 140/88 (BP Location: Right Arm, Patient Position: Sitting, Cuff Size: Normal)   Pulse 65   Temp 97.8 F (36.6 C) (Oral)   Resp 20   Wt 173 lb 9.6 oz (78.7 kg)   SpO2 100% Comment: room air  BMI 25.64 kg/m

## 2016-01-06 ENCOUNTER — Ambulatory Visit
Admission: RE | Admit: 2016-01-06 | Discharge: 2016-01-06 | Disposition: A | Discharge status: Home / Self Care | Payer: Managed Care, Other (non HMO) | Source: Ambulatory Visit | Attending: Radiation Oncology | Admitting: Radiation Oncology

## 2016-01-06 ENCOUNTER — Ambulatory Visit: Payer: Managed Care, Other (non HMO)

## 2016-01-06 DIAGNOSIS — Z7984 Long term (current) use of oral hypoglycemic drugs: Secondary | ICD-10-CM | POA: Diagnosis not present

## 2016-01-06 DIAGNOSIS — R001 Bradycardia, unspecified: Secondary | ICD-10-CM | POA: Diagnosis not present

## 2016-01-06 DIAGNOSIS — Z7982 Long term (current) use of aspirin: Secondary | ICD-10-CM | POA: Diagnosis not present

## 2016-01-06 DIAGNOSIS — C154 Malignant neoplasm of middle third of esophagus: Secondary | ICD-10-CM | POA: Diagnosis not present

## 2016-01-06 DIAGNOSIS — R634 Abnormal weight loss: Secondary | ICD-10-CM | POA: Diagnosis not present

## 2016-01-06 DIAGNOSIS — M546 Pain in thoracic spine: Secondary | ICD-10-CM | POA: Diagnosis not present

## 2016-01-06 DIAGNOSIS — K219 Gastro-esophageal reflux disease without esophagitis: Secondary | ICD-10-CM | POA: Diagnosis not present

## 2016-01-06 DIAGNOSIS — Z800 Family history of malignant neoplasm of digestive organs: Secondary | ICD-10-CM | POA: Diagnosis not present

## 2016-01-06 DIAGNOSIS — R1310 Dysphagia, unspecified: Secondary | ICD-10-CM | POA: Diagnosis not present

## 2016-01-07 ENCOUNTER — Ambulatory Visit
Admission: RE | Admit: 2016-01-07 | Discharge: 2016-01-07 | Disposition: A | Discharge status: Home / Self Care | Payer: Managed Care, Other (non HMO) | Source: Ambulatory Visit | Attending: Radiation Oncology | Admitting: Radiation Oncology

## 2016-01-07 ENCOUNTER — Ambulatory Visit: Payer: Managed Care, Other (non HMO)

## 2016-01-07 DIAGNOSIS — Z7982 Long term (current) use of aspirin: Secondary | ICD-10-CM | POA: Diagnosis not present

## 2016-01-07 DIAGNOSIS — Z7984 Long term (current) use of oral hypoglycemic drugs: Secondary | ICD-10-CM | POA: Diagnosis not present

## 2016-01-07 DIAGNOSIS — M546 Pain in thoracic spine: Secondary | ICD-10-CM | POA: Diagnosis not present

## 2016-01-07 DIAGNOSIS — R001 Bradycardia, unspecified: Secondary | ICD-10-CM | POA: Diagnosis not present

## 2016-01-07 DIAGNOSIS — Z800 Family history of malignant neoplasm of digestive organs: Secondary | ICD-10-CM | POA: Diagnosis not present

## 2016-01-07 DIAGNOSIS — C154 Malignant neoplasm of middle third of esophagus: Secondary | ICD-10-CM | POA: Diagnosis not present

## 2016-01-07 DIAGNOSIS — R634 Abnormal weight loss: Secondary | ICD-10-CM | POA: Diagnosis not present

## 2016-01-07 DIAGNOSIS — K219 Gastro-esophageal reflux disease without esophagitis: Secondary | ICD-10-CM | POA: Diagnosis not present

## 2016-01-07 DIAGNOSIS — R1310 Dysphagia, unspecified: Secondary | ICD-10-CM | POA: Diagnosis not present

## 2016-01-08 ENCOUNTER — Ambulatory Visit (HOSPITAL_BASED_OUTPATIENT_CLINIC_OR_DEPARTMENT_OTHER): Payer: Managed Care, Other (non HMO)

## 2016-01-08 ENCOUNTER — Ambulatory Visit: Payer: Managed Care, Other (non HMO)

## 2016-01-08 ENCOUNTER — Ambulatory Visit
Admission: RE | Admit: 2016-01-08 | Discharge: 2016-01-08 | Disposition: A | Discharge status: Home / Self Care | Payer: Managed Care, Other (non HMO) | Source: Ambulatory Visit | Attending: Radiation Oncology | Admitting: Radiation Oncology

## 2016-01-08 ENCOUNTER — Ambulatory Visit: Payer: Managed Care, Other (non HMO) | Admitting: Nutrition

## 2016-01-08 ENCOUNTER — Ambulatory Visit (HOSPITAL_BASED_OUTPATIENT_CLINIC_OR_DEPARTMENT_OTHER): Payer: Managed Care, Other (non HMO) | Admitting: Hematology

## 2016-01-08 ENCOUNTER — Other Ambulatory Visit (HOSPITAL_BASED_OUTPATIENT_CLINIC_OR_DEPARTMENT_OTHER): Payer: Managed Care, Other (non HMO)

## 2016-01-08 VITALS — BP 133/83 | HR 67 | Temp 98.5°F | Resp 17 | Ht 69.0 in | Wt 173.9 lb

## 2016-01-08 DIAGNOSIS — Z7984 Long term (current) use of oral hypoglycemic drugs: Secondary | ICD-10-CM | POA: Diagnosis not present

## 2016-01-08 DIAGNOSIS — C154 Malignant neoplasm of middle third of esophagus: Secondary | ICD-10-CM | POA: Diagnosis not present

## 2016-01-08 DIAGNOSIS — Z5111 Encounter for antineoplastic chemotherapy: Secondary | ICD-10-CM | POA: Diagnosis not present

## 2016-01-08 DIAGNOSIS — Z7982 Long term (current) use of aspirin: Secondary | ICD-10-CM | POA: Diagnosis not present

## 2016-01-08 DIAGNOSIS — R634 Abnormal weight loss: Secondary | ICD-10-CM | POA: Diagnosis not present

## 2016-01-08 DIAGNOSIS — Z800 Family history of malignant neoplasm of digestive organs: Secondary | ICD-10-CM | POA: Diagnosis not present

## 2016-01-08 DIAGNOSIS — R001 Bradycardia, unspecified: Secondary | ICD-10-CM | POA: Diagnosis not present

## 2016-01-08 DIAGNOSIS — M546 Pain in thoracic spine: Secondary | ICD-10-CM | POA: Diagnosis not present

## 2016-01-08 DIAGNOSIS — R1310 Dysphagia, unspecified: Secondary | ICD-10-CM

## 2016-01-08 DIAGNOSIS — K219 Gastro-esophageal reflux disease without esophagitis: Secondary | ICD-10-CM | POA: Diagnosis not present

## 2016-01-08 DIAGNOSIS — F4325 Adjustment disorder with mixed disturbance of emotions and conduct: Secondary | ICD-10-CM

## 2016-01-08 LAB — COMPREHENSIVE METABOLIC PANEL
ALT: 10 U/L (ref 0–55)
AST: 14 U/L (ref 5–34)
Albumin: 3.5 g/dL (ref 3.5–5.0)
Alkaline Phosphatase: 79 U/L (ref 40–150)
Anion Gap: 11 mEq/L (ref 3–11)
BUN: 10.8 mg/dL (ref 7.0–26.0)
CO2: 24 mEq/L (ref 22–29)
Calcium: 8.9 mg/dL (ref 8.4–10.4)
Chloride: 106 mEq/L (ref 98–109)
Creatinine: 0.8 mg/dL (ref 0.7–1.3)
EGFR: >90 mL/min/{1.73_m2} (ref >90)
Glucose: 110 mg/dl (ref 70–140)
Potassium: 3.8 mEq/L (ref 3.5–5.1)
Sodium: 140 mEq/L (ref 136–145)
Total Bilirubin: 0.36 mg/dL (ref 0.20–1.20)
Total Protein: 6.9 g/dL (ref 6.4–8.3)

## 2016-01-08 LAB — CBC WITH DIFFERENTIAL/PLATELET
BASO%: 0.5 % (ref 0.0–2.0)
Basophils Absolute: 0 10*3/uL (ref 0.0–0.1)
EOS%: 2 % (ref 0.0–7.0)
Eosinophils Absolute: 0 10*3/uL (ref 0.0–0.5)
HCT: 34.6 % — ABNORMAL LOW (ref 38.4–49.9)
HGB: 11.1 g/dL — ABNORMAL LOW (ref 13.0–17.1)
LYMPH%: 12.9 % — ABNORMAL LOW (ref 14.0–49.0)
MCH: 23.2 pg — ABNORMAL LOW (ref 27.2–33.4)
MCHC: 32.1 g/dL (ref 32.0–36.0)
MCV: 72.4 fL — ABNORMAL LOW (ref 79.3–98.0)
MONO#: 0.2 10*3/uL (ref 0.1–0.9)
MONO%: 11.9 % (ref 0.0–14.0)
NEUT#: 1.5 10*3/uL (ref 1.5–6.5)
NEUT%: 72.7 % (ref 39.0–75.0)
Platelets: 195 10*3/uL (ref 140–400)
RBC: 4.78 10*6/uL (ref 4.20–5.82)
RDW: 15.4 % — ABNORMAL HIGH (ref 11.0–14.6)
WBC: 2 10*3/uL — ABNORMAL LOW (ref 4.0–10.3)
lymph#: 0.3 10*3/uL — ABNORMAL LOW (ref 0.9–3.3)

## 2016-01-08 MED ORDER — OXYCODONE-ACETAMINOPHEN 5-325 MG PO TABS: 1.0000 | ORAL_TABLET | Freq: Four times a day (QID) | ORAL | 0 refills | Status: DC | PRN

## 2016-01-08 MED ORDER — HEPARIN SOD (PORK) LOCK FLUSH 100 UNIT/ML IV SOLN
500.0000 [IU] | Freq: Once | INTRAVENOUS | Status: AC | PRN
  Administered 2016-01-08: 500 [IU]
  Filled 2016-01-08: qty 5

## 2016-01-08 MED ORDER — PALONOSETRON HCL INJECTION 0.25 MG/5ML
INTRAVENOUS | Status: AC
  Filled 2016-01-08: qty 5

## 2016-01-08 MED ORDER — SODIUM CHLORIDE 0.9 % IV SOLN
Freq: Once | INTRAVENOUS | Status: AC
Start: 2016-01-08 — End: 2016-01-08
  Administered 2016-01-08: 11:00:00 via INTRAVENOUS

## 2016-01-08 MED ORDER — CARBOPLATIN CHEMO INJECTION 450 MG/45ML
193.4000 mg | Freq: Once | INTRAVENOUS | Status: AC
  Administered 2016-01-08: 190 mg via INTRAVENOUS
  Filled 2016-01-08: qty 19

## 2016-01-08 MED ORDER — PACLITAXEL PROTEIN-BOUND CHEMO INJECTION 100 MG
45.0000 mg/m2 | Freq: Once | INTRAVENOUS | Status: AC
  Administered 2016-01-08: 100 mg via INTRAVENOUS
  Filled 2016-01-08: qty 20

## 2016-01-08 MED ORDER — SODIUM CHLORIDE 0.9% FLUSH
10.0000 mL | INTRAVENOUS | Status: DC | PRN
  Administered 2016-01-08: 10 mL
  Filled 2016-01-08: qty 10

## 2016-01-08 MED ORDER — PALONOSETRON HCL INJECTION 0.25 MG/5ML
0.2500 mg | Freq: Once | INTRAVENOUS | Status: AC
  Administered 2016-01-08: 0.25 mg via INTRAVENOUS

## 2016-01-08 NOTE — Progress Notes (Signed)
Sent to next appt for today

## 2016-01-08 NOTE — Progress Notes (Signed)
[place]  [phone] [phone]  Clinic follow Up Note   Patient Care Team: Rama (Merrilee Seashore) [name], MD (Inactive) as PCP - General (Internal Medicine) [name], MD as Consulting Physician (Hematology) Kyung Rudd, MD as Consulting Physician (Radiation Oncology) 01/08/2016   CHIEF COMPLAINTS:  Follow up esophageal squamous cell carcinoma  Oncology History   Presented with dysphagia and odynophagia with 20-25 pounds of weight loss in about 3-4 months  Esophageal cancer ([place])   Staging form: Esophagus - Adenocarcinoma, AJCC 7th Edition   - Clinical stage from 11/13/2015: Stage IIIB (T3, N2, M0, G2) - Signed by [name], MD on 12/11/2015      Esophageal cancer ([place])   11/13/2015 Procedure    UPPER ENDOSCOPY per Dr. Collene Mares: Large fungating, friable bleeding mass in middle third of esophagus, 22cm from incisors and extended to 27cm. Non-obstructing.      11/13/2015 Pathology Results    Invasive squamous cell carcinoma; moderately differentiated      11/13/2015 Imaging    CT ABD/PELVIS: IMPRESSION:No evidence of metastatic disease in the abdomen or pelvis. 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 Aortoiliac atherosclerosis. Small bilateral inguinal hernias containing fat the      11/22/2015 Initial Diagnosis    Esophageal cancer ([place])     12/03/2015 Imaging    PET scan showed a long segment of hypermetabolic thickening in the mid esophagus consistent with esophageal carcinoma. No clear evidence of pedis tunnel node metastasis or distant metastasis.      12/10/2015 -  Radiation Therapy    Neoadjuvant radiation to his esophageal cancer      12/10/2015 -  Chemotherapy    Neoadjuvant weekly carboplatin AUC 2, and Taxol 45 mg/m, with concurrent radiation. He developed steroid-induced psychosis, and Taxol was changed to Abraxane to avoid premedication with steroids.       HISTORY OF PRESENTING ILLNESS:  Juan [name] 75 y.o. male is here  because of His recently diagnosed esophageal cancer. He is accompanied by his wife to my clinic today.  He started having sore throat and dysphagia about 4-5 months ago, and was seen by PCP, had 2 course antibiotics but is symptom progressed. He was referred to gastroenterologist Dr. Suezanne Cheshire, and underwent EGD on 11/13/2015, which showed a large fungating, friable bleeding mass in the middle third of esophagus, 22 cm to 27 cm from incisors, non-obstructing. The biopsy showed squamous cell carcinoma. His CT abdomen and pelvis was negative for metastatic disease.  He has lost 20lbs, he is on soft diet and liquids, his pain is about 7/10 pain with swallowing. He also has chest pain , intermittent, it lasts about a few minutes, no nausea or vomiting, his bowel movement is normal. He denies melena or hematochezia. He is able to function and tolerated light activities at home.  CURRENT THERAPY: concurrent chemotherapy and radiation, with weekly carboplatin AUC 2 and Taxol (changed to Abraxane from weeks 3) 45 mg/m, started on 12/10/2015  INTERIM HISTORY: Juan [name] returns for follow-up and 5th week of chemotherapy and irradiation. He has been tolerating chemotherapy well overall, no significant nausea, or neuropathy. He complains some body aches, possible related to chemotherapy. His dysphagia is stable, odynophagia has improved some. He gained a few pounds over the past 2 weeks. No other new complaints.  MEDICAL HISTORY:  Past Medical History:  Diagnosis Date  . Esophageal cancer ([place])   . Reflux     SURGICAL HISTORY: Past Surgical History:  Procedure Laterality Date  .  EUS N/A 12/05/2015   Procedure: UPPER ENDOSCOPIC ULTRASOUND (EUS) LINEAR;  Surgeon: Carol Ada, MD;  Location: [facility];  Service: Endoscopy;  Laterality: N/A;  . HERNIA REPAIR    . IR GENERIC HISTORICAL  12/24/2015   IR FLUORO GUIDE PORT INSERTION RIGHT 12/24/2015 Arne Cleveland, MD [facility]  . IR GENERIC HISTORICAL   12/24/2015   IR US GUIDE VASC ACCESS RIGHT 12/24/2015 Arne Cleveland, MD [facility]  . SHOULDER ARTHROSCOPY W/ SUPERIOR LABRAL ANTERIOR POSTERIOR LESION REPAIR      SOCIAL HISTORY: Social History   Social History  . Marital status: Married    Spouse name: N/A  . Number of children: N/A  . Years of education: N/A   Occupational History  . Not on file.   Social History Main Topics  . Smoking status: Former Smoker    Packs/day: 2.00    Years: 40.00    Quit date: 03/31/1999  . Smokeless tobacco: Never Used  . Alcohol use No     Comment: weekend binge drinking for 30-40 years, quit in 2001  . Drug use: No  . Sexual activity: Not on file   Other Topics Concern  . 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 Colon cancer Father 18  . Colon cancer Brother 58    ALLERGIES:  has No Known Allergies.  MEDICATIONS:  Current Outpatient Prescriptions  Medication Sig Dispense Refill  . albuterol (PROVENTIL HFA;VENTOLIN HFA) 108 (90 BASE) MCG/ACT inhaler Inhale 2 puffs into the lungs every 4 (four) hours as needed for wheezing. (Patient not taking: Reported on 01/03/2016) 1 Inhaler 0  . amitriptyline (ELAVIL) 10 MG tablet Take 10 mg by mouth at bedtime. 11/16/15 - Take 1 tablet at Bedtime x 1 Week, then increase to 2 tablets Once Daily Orally- 30 days    . aspirin EC 81 MG tablet Take 81 mg by mouth daily.    Marland Kitchen atorvastatin (LIPITOR) 20 MG tablet Take 20 mg by mouth at bedtime.     . lidocaine-prilocaine (EMLA) cream Apply 1 application topically as needed. 30 g 1  . metFORMIN (GLUCOPHAGE) 500 MG tablet Take 500 mg by mouth 2 (two) times daily with a meal.     . omeprazole (PRILOSEC) 40 MG capsule Take 40 mg by mouth daily.     . ondansetron  (ZOFRAN) 8 MG tablet Take 1 tablet (8 mg total) by mouth 2 (two) times daily as needed for refractory nausea / vomiting. Start on day 3 after chemo. 30 tablet 1  . oxyCODONE-acetaminophen (PERCOCET/ROXICET) 5-325 MG tablet Take 1-2 tablets by mouth every 6 (six) hours as needed for severe pain. 120 tablet 0  . prochlorperazine (COMPAZINE) 10 MG tablet Take 1 tablet (10 mg total) by mouth every 6 (six) hours as needed (Nausea or vomiting). 30 tablet 1  . sucralfate (CARAFATE) 1 g tablet Take 1 tablet (1 g total) by mouth 4 (four) times daily. 120 tablet 2  . Wound Dressings (SONAFINE) Apply 1 application topically daily.     No current facility-administered medications for this visit.     REVIEW OF SYSTEMS:   Constitutional: Denies fevers, chills or abnormal night sweats Eyes: Denies blurriness of vision, double vision or watery  eyes Ears, nose, mouth, throat, and face: Denies mucositis or sore throat Respiratory: Denies cough, dyspnea or wheezes Cardiovascular: Denies palpitation, chest discomfort or lower extremity swelling Gastrointestinal:  Denies nausea, heartburn or change in bowel habits Skin: Denies abnormal skin rashes Lymphatics: Denies new lymphadenopathy or easy bruising Neurological:Denies numbness, tingling or new weaknesses Behavioral/Psych: Mood is stable, no new changes  All other systems were reviewed with the patient and are negative.  PHYSICAL EXAMINATION: ECOG PERFORMANCE STATUS: 1 - Symptomatic but completely ambulatory  Vitals:   01/08/16 1012  BP: 133/83  Pulse: 67  Resp: 17  Temp: 98.5 F (36.9 C)   Filed Weights   01/08/16 1012  Weight: 173 lb 14.4 oz (78.9 kg)    GENERAL:alert, no distress and comfortable SKIN: skin color, texture, turgor are normal, no rashes or significant lesions EYES: normal, conjunctiva are pink and non-injected, sclera clear OROPHARYNX:no exudate, no erythema and lips, buccal mucosa, and tongue normal  NECK: supple, thyroid  normal size, non-tender, without nodularity LYMPH:  no palpable lymphadenopathy in the cervical, axillary or inguinal LUNGS: clear to auscultation and percussion with normal breathing effort HEART: regular rate & rhythm and no murmurs and no lower extremity edema ABDOMEN:abdomen soft, non-tender and normal bowel sounds Musculoskeletal:no cyanosis of digits and no clubbing  PSYCH: alert & oriented x 3 with fluent speech NEURO: no focal motor/sensory deficits  LABORATORY DATA:  I have reviewed the data as listed CBC Latest Ref Rng & Units 01/08/2016 01/01/2016 12/25/2015  WBC 4.0 - 10.3 10e3/uL 2.0(L) 2.5(L) 2.8(L)  Hemoglobin 13.0 - 17.1 g/dL 11.1(L) 11.2(L) 10.6(L)  Hematocrit 38.4 - 49.9 % 34.6(L) 35.0(L) 34.1(L)  Platelets 140 - 400 10e3/uL 195 223 257   CMP Latest Ref Rng & Units 01/08/2016 01/01/2016 12/25/2015  Glucose 70 - 140 mg/dl 110 103 106  BUN 7.0 - 26.0 mg/dL 10.8 14.3 9.1  Creatinine 0.7 - 1.3 mg/dL 0.8 0.8 0.7  Sodium 136 - 145 mEq/L 140 139 137  Potassium 3.5 - 5.1 mEq/L 3.8 3.9 4.1  Chloride 101 - 111 mmol/L - - -  CO2 22 - 29 mEq/L 24 26 24   Calcium 8.4 - 10.4 mg/dL 8.9 9.4 9.0  Total Protein 6.4 - 8.3 g/dL 6.9 7.1 6.8  Total Bilirubin 0.20 - 1.20 mg/dL 0.36 0.37 0.40  Alkaline Phos 40 - 150 U/L 79 81 90  AST 5 - 34 U/L 14 11 12   ALT 0 - 55 U/L 10 10 12    ANC 1.5 TODAY  PATHOLOGY REPORT  Final microscopic diagnosis Esophagus, 0.2 cm to 27 cm, biopsy -Invasive squamous cell carcinoma, moderately differentiated   RADIOGRAPHIC STUDIES: I have personally reviewed the radiological images as listed and agreed with the findings in the report. Ct Head Wo Contrast  Result Date: 12/19/2015 CLINICAL DATA:  Patient with history of psychiatric issues. History of esophageal carcinoma. EXAM: CT HEAD WITHOUT CONTRAST TECHNIQUE: Contiguous axial images were obtained from the base of the skull through the vertex without intravenous contrast. COMPARISON:  Brain CT 01/05/2014  FINDINGS: Brain: Ventricles and sulci are appropriate for patient's age. No evidence for acute cortically based infarct, intracranial hemorrhage, mass lesion or mass-effect. Vascular: No hyperdense vessel or unexpected calcification. Skull: Normal. Negative for fracture or focal lesion. Sinuses/Orbits: No acute finding. Other: None. IMPRESSION: No acute intracranial process. Electronically Signed   By: Lovey Newcomer M.D.   On: 12/19/2015 15:51   Ir US Guide Vasc Access Right  Result Date: 12/24/2015 CLINICAL DATA:  Esophageal carcinoma, needs  durable venous access for chemotherapy regimen EXAM: TUNNELED PORT CATHETER PLACEMENT WITH ULTRASOUND AND FLUOROSCOPIC GUIDANCE FLUOROSCOPY TIME:  0.2 minute, 37  uGym2 DAP ANESTHESIA/SEDATION: Intravenous Fentanyl and Versed were administered as conscious sedation during continuous monitoring of the patient's level of consciousness and physiological / cardiorespiratory status by the radiology RN, with a total moderate sedation time of 18 minutes. TECHNIQUE: The procedure, risks, benefits, and alternatives were explained to the patient. Questions regarding the procedure were encouraged and answered. The patient understands and consents to the procedure. As antibiotic prophylaxis, cefazolin 2 g was ordered pre-procedure and administered intravenously within one hour of incision. Patency of the right IJ vein was confirmed with ultrasound with image documentation. An appropriate skin site was determined. Skin site was marked. Region was prepped using maximum barrier technique including cap and mask, sterile gown, sterile gloves, large sterile sheet, and Chlorhexidine as cutaneous antisepsis. The region was infiltrated locally with 1% lidocaine. Under real-time ultrasound guidance, the right IJ vein was accessed with a 21 gauge micropuncture needle; the needle tip within the vein was confirmed with ultrasound image documentation. Needle was exchanged over a 018 guidewire for  transitional dilator which allowed passage of the [facility] wire into the IVC. Over this, the transitional dilator was exchanged for a 5 [place] MPA catheter. A small incision was made on the right anterior chest wall and a subcutaneous pocket fashioned. The power-injectable port was positioned and its catheter tunneled to the right IJ dermatotomy site. The MPA catheter was exchanged over an Amplatz wire for a peel-away sheath, through which the port catheter, which had been trimmed to the appropriate length, was advanced and positioned under fluoroscopy with its tip at the cavoatrial junction. Spot chest radiograph confirms good catheter position and no pneumothorax. The pocket was closed with deep interrupted and subcuticular continuous 3-0 Monocryl sutures. The port was flushed per protocol. The incisions were covered with Dermabond then covered with a sterile dressing. COMPLICATIONS: COMPLICATIONS None immediate IMPRESSION: Technically successful right IJ power-injectable port catheter placement. Ready for routine use. Electronically Signed   By: Lucrezia Europe M.D.   On: 12/24/2015 16:26   Ir Fluoro Guide Port Insertion Right  Result Date: 12/24/2015 CLINICAL DATA:  Esophageal carcinoma, needs durable venous access for chemotherapy regimen EXAM: TUNNELED PORT CATHETER PLACEMENT WITH ULTRASOUND AND FLUOROSCOPIC GUIDANCE FLUOROSCOPY TIME:  0.2 minute, 37  uGym2 DAP ANESTHESIA/SEDATION: Intravenous Fentanyl and Versed were administered as conscious sedation during continuous monitoring of the patient's level of consciousness and physiological / cardiorespiratory status by the radiology RN, with a total moderate sedation time of 18 minutes. TECHNIQUE: The procedure, risks, benefits, and alternatives were explained to the patient. Questions regarding the procedure were encouraged and answered. The patient understands and consents to the procedure. As antibiotic prophylaxis, cefazolin 2 g was ordered pre-procedure and  administered intravenously within one hour of incision. Patency of the right IJ vein was confirmed with ultrasound with image documentation. An appropriate skin site was determined. Skin site was marked. Region was prepped using maximum barrier technique including cap and mask, sterile gown, sterile gloves, large sterile sheet, and Chlorhexidine as cutaneous antisepsis. The region was infiltrated locally with 1% lidocaine. Under real-time ultrasound guidance, the right IJ vein was accessed with a 21 gauge micropuncture needle; the needle tip within the vein was confirmed with ultrasound image documentation. Needle was exchanged over a 018 guidewire for transitional dilator which allowed passage of the [facility] wire into the IVC. Over this, the transitional dilator was exchanged  for a 5 [place] MPA catheter. A small incision was made on the right anterior chest wall and a subcutaneous pocket fashioned. The power-injectable port was positioned and its catheter tunneled to the right IJ dermatotomy site. The MPA catheter was exchanged over an Amplatz wire for a peel-away sheath, through which the port catheter, which had been trimmed to the appropriate length, was advanced and positioned under fluoroscopy with its tip at the cavoatrial junction. Spot chest radiograph confirms good catheter position and no pneumothorax. The pocket was closed with deep interrupted and subcuticular continuous 3-0 Monocryl sutures. The port was flushed per protocol. The incisions were covered with Dermabond then covered with a sterile dressing. COMPLICATIONS: COMPLICATIONS None immediate IMPRESSION: Technically successful right IJ power-injectable port catheter placement. Ready for routine use. Electronically Signed   By: Lucrezia Europe M.D.   On: 12/24/2015 16:26   EGD 11/13/2015 Dr. Collene Mares  -A large, fungating, friable mass with bleeding was found in the mid third of the esophagus, 22 cm from the incisors and extended to 27 cm. The mass was  nonobstructing and a circumferential, biopsy was taken. -Normal stomach -Normal proximal small bowel  Colonoscopy 11/13/2015 Dr. Collene Mares -Diverticulosis in the entire exam: -No specimens collected  EUS 12/05/2015 Malignant esophageal tumor was found in the upper third of the esophagus and in the middle third of the esophagus. - A mass was found in the cervical esophagus and in the thoracic esophagus. The diagnosis is squamous cell carcinoma. This was staged T3 N1/N2 Mx by endosonographic criteria. - No specimens collected.   ASSESSMENT & PLAN: 75 year old gentleman, without significant past medical history except acid reflux, presented with progressive dysphagia and odynophagia and weight loss.  1. Esophageal cancer, squamous cell carcinoma, cT3N1-2M0, stage IIIB -I reviewed his EGD findings, biopsy results, and the CT scan findings with patient and his wife in great details -I dicussed staging PET scan findings with patient and his wife, the mid esophageal mass is hypermetabolic, no distant metastasis. -EUS revealed a T3 lesion, N1 vs N2, locally advanced disease. -We recommend him to have neoadjuvant radiation and chemotherapy with weekly carboplatin and Taxol -Taxol was changed to Abraxane due to cirrhosis induced psychosis -He is tolerating treatment moderately well, has gained some weight back, I encouraged him to continue nutrition supplement -He will complete neoadjuvant chemotherapy and radiation next week -He scheduled to see Dr. Servando Snare after he completes neoadjuvant chemotherapy and radiation  2. Dysphagia  -He is able to tolerate a soft diet and drinks fluids adequately -follow up with dietician Pamala Hurry  -He has started gaining some weight back in the past few weeks  3. Odynophagia -Dr. Lisbeth Renshaw has prescribed Percocet as needed for his pain, he will continue , he does not use much  4. Steroids induced psychosis -Resolved now. We'll avoid steroids in the  future.  Recommendations -Lab results reviewed, adequate for treatment, we'll proceed 5th week chemotherapy carbo and Abraxane today -We'll see him next week for last cycle chemo   All questions were answered. The patient knows to call the clinic with any problems, questions or concerns.  I spent 15 minutes counseling the patient face to face. The total time spent in the appointment was 20 minutes and more than 50% was on counseling.     [name], MD 01/08/2016

## 2016-01-08 NOTE — Patient Instructions (Signed)
[facility] Discharge Instructions for Patients Receiving Chemotherapy  Today you received the following chemotherapy agents: Abraxane and Carboplatin.  To help prevent nausea and vomiting after your treatment, we encourage you to take your nausea medication as directed.   If you develop nausea and vomiting that is not controlled by your nausea medication, call the clinic.   BELOW ARE SYMPTOMS THAT SHOULD BE REPORTED IMMEDIATELY:  *FEVER GREATER THAN 100.5 F  *CHILLS WITH OR WITHOUT FEVER  NAUSEA AND VOMITING THAT IS NOT CONTROLLED WITH YOUR NAUSEA MEDICATION  *UNUSUAL SHORTNESS OF BREATH  *UNUSUAL BRUISING OR BLEEDING  TENDERNESS IN MOUTH AND THROAT WITH OR WITHOUT PRESENCE OF ULCERS  *URINARY PROBLEMS  *BOWEL PROBLEMS  UNUSUAL RASH Items with * indicate a potential emergency and should be followed up as soon as possible.  Feel free to call the clinic you have any questions or concerns. The clinic phone number is [phone].  Please show the CHEMO ALERT CARD at check-in to the Emergency Department and triage nurse.   

## 2016-01-08 NOTE — Progress Notes (Signed)
Nutrition follow-up completed with patient during infusion for esophageal cancer. Weight improved and documented as 173.9 pounds on October 11. Patient complains of taste alterations and constipation. He continues to drink oral nutrition supplements but is waiting for his check to come in before he can purchase more. Reports he is planning on taking additional medicine for constipation.  Nutrition diagnosis: Unintended weight loss has improved.    Intervention: Recommended patient continue oral nutrition supplements 3-4 times a day along with meals and snacks. Provided third and final complementary case of Ensure Plus. Educated patient on strategies for taste alterations, and constipation. Fact sheets were provided. Questions were answered.  Teach back method was used.  Monitoring, evaluation, goals: Patient will work to continue increased calories and protein to minimize weight loss.  Next visit: Thursday, October 19.  **Disclaimer: This note was dictated with voice recognition software. Similar sounding words can inadvertently be transcribed and this note may contain transcription errors which may not have been corrected upon publication of note.**

## 2016-01-09 ENCOUNTER — Ambulatory Visit
Admission: RE | Admit: 2016-01-09 | Discharge: 2016-01-09 | Disposition: A | Discharge status: Home / Self Care | Payer: Managed Care, Other (non HMO) | Source: Ambulatory Visit | Attending: Radiation Oncology | Admitting: Radiation Oncology

## 2016-01-09 ENCOUNTER — Encounter: Payer: Self-pay | Admitting: Hematology

## 2016-01-09 ENCOUNTER — Ambulatory Visit: Payer: Managed Care, Other (non HMO)

## 2016-01-09 ENCOUNTER — Encounter: Payer: Managed Care, Other (non HMO) | Admitting: Nutrition

## 2016-01-09 ENCOUNTER — Encounter: Payer: Self-pay | Admitting: Radiation Oncology

## 2016-01-09 VITALS — BP 111/76 | HR 69 | Temp 97.9°F | Resp 20 | Wt 174.0 lb

## 2016-01-09 DIAGNOSIS — Z7984 Long term (current) use of oral hypoglycemic drugs: Secondary | ICD-10-CM | POA: Diagnosis not present

## 2016-01-09 DIAGNOSIS — C154 Malignant neoplasm of middle third of esophagus: Secondary | ICD-10-CM | POA: Diagnosis not present

## 2016-01-09 DIAGNOSIS — K219 Gastro-esophageal reflux disease without esophagitis: Secondary | ICD-10-CM | POA: Diagnosis not present

## 2016-01-09 DIAGNOSIS — M546 Pain in thoracic spine: Secondary | ICD-10-CM | POA: Diagnosis not present

## 2016-01-09 DIAGNOSIS — R1310 Dysphagia, unspecified: Secondary | ICD-10-CM | POA: Diagnosis not present

## 2016-01-09 DIAGNOSIS — R634 Abnormal weight loss: Secondary | ICD-10-CM | POA: Diagnosis not present

## 2016-01-09 DIAGNOSIS — Z7982 Long term (current) use of aspirin: Secondary | ICD-10-CM | POA: Diagnosis not present

## 2016-01-09 DIAGNOSIS — R001 Bradycardia, unspecified: Secondary | ICD-10-CM | POA: Diagnosis not present

## 2016-01-09 DIAGNOSIS — Z800 Family history of malignant neoplasm of digestive organs: Secondary | ICD-10-CM | POA: Diagnosis not present

## 2016-01-09 NOTE — Progress Notes (Signed)
Department of Radiation Oncology  Phone:  [phone] Fax:        [phone]  Weekly Treatment Note    Name: Juan [name] Date: 01/09/2016 MRN: [id number] DOB: [birth year]   Diagnosis:     ICD-9-CM ICD-10-CM   1. Malignant neoplasm of middle third of esophagus (HCC) 150.4 C15.4      Current dose: 44 Gy  Current fraction: 22   MEDICATIONS: Current Outpatient Prescriptions  Medication Sig Dispense Refill  . albuterol (PROVENTIL HFA;VENTOLIN HFA) 108 (90 BASE) MCG/ACT inhaler Inhale 2 puffs into the lungs every 4 (four) hours as needed for wheezing. 1 Inhaler 0  . amitriptyline (ELAVIL) 10 MG tablet Take 10 mg by mouth at bedtime. 11/16/15 - Take 1 tablet at Bedtime x 1 Week, then increase to 2 tablets Once Daily Orally- 30 days    . aspirin EC 81 MG tablet Take 81 mg by mouth daily.    Marland Kitchen atorvastatin (LIPITOR) 20 MG tablet Take 20 mg by mouth at bedtime.     . lidocaine-prilocaine (EMLA) cream Apply 1 application topically as needed. 30 g 1  . metFORMIN (GLUCOPHAGE) 500 MG tablet Take 500 mg by mouth 2 (two) times daily with a meal.     . omeprazole (PRILOSEC) 40 MG capsule Take 40 mg by mouth daily.     . ondansetron (ZOFRAN) 8 MG tablet Take 1 tablet (8 mg total) by mouth 2 (two) times daily as needed for refractory nausea / vomiting. Start on day 3 after chemo. 30 tablet 1  . oxyCODONE-acetaminophen (PERCOCET/ROXICET) 5-325 MG tablet Take 1-2 tablets by mouth every 6 (six) hours as needed for severe pain. 90 tablet 0  . prochlorperazine (COMPAZINE) 10 MG tablet Take 1 tablet (10 mg total) by mouth every 6 (six) hours as needed (Nausea or vomiting). 30 tablet 1  . sucralfate (CARAFATE) 1 g tablet Take 1 tablet (1 g total) by mouth 4 (four) times daily. 120 tablet 2  . Wound Dressings (SONAFINE) Apply 1 application topically daily.     No current facility-administered medications for this encounter.      ALLERGIES: Review of patient's allergies indicates no known  allergies.   LABORATORY DATA:  Lab Results  Component Value Date   WBC 2.0 (L) 01/08/2016   HGB 11.1 (L) 01/08/2016   HCT 34.6 (L) 01/08/2016   MCV 72.4 (L) 01/08/2016   PLT 195 01/08/2016   Lab Results  Component Value Date   NA 140 01/08/2016   K 3.8 01/08/2016   CL 105 12/24/2015   CO2 24 01/08/2016   Lab Results  Component Value Date   ALT 10 01/08/2016   AST 14 01/08/2016   ALKPHOS 79 01/08/2016   BILITOT 0.36 01/08/2016     NARRATIVE: Juan [name] was seen today for weekly treatment management. The chart was checked and the patient's films were reviewed.  Weekly rad txs esophagus 22/25 completed, eating some better stated,had bowel movement today stated, no nausea, still has indigestion  , even when he takes carafate 4x day,says it only lasts about 1 hopur and then it is back again, eating  Softer foods, saw Guy Franco, dietician yesterday, energy level, pretty good stated,  Gave 1 month follow up appt with Alean Rinne 3:57 PM BP 111/76 (BP Location: Left Arm, Patient Position: Sitting, Cuff Size: Normal)   Pulse 69   Temp 97.9 F (36.6 C) (Oral)   Resp 20   Wt 174 lb (78.9 kg)   SpO2 100% Comment: room  air  BMI 25.70 kg/m   Wt Readings from Last 3 Encounters:  01/09/16 174 lb (78.9 kg)  01/08/16 173 lb 14.4 oz (78.9 kg)  01/03/16 173 lb 9.6 oz (78.7 kg)    PHYSICAL EXAMINATION: weight is 174 lb (78.9 kg). His oral temperature is 97.9 F (36.6 C). His blood pressure is 111/76 and his pulse is 69. His respiration is 20 and oxygen saturation is 100%.        ASSESSMENT: The patient is doing satisfactorily with treatment.  PLAN: We will continue with the patient's radiation treatment as planned. I am very pleased with how the patient is doing. Swallowing better in recent days which is great.

## 2016-01-09 NOTE — Progress Notes (Signed)
Weekly rad txs esophagus 22/25 completed, eating some better stated,had bowel movement today stated, no nausea, still has indigestion  , even when he takes carafate 4x day,says it only lasts about 1 hopur and then it is back again, eating  Softer foods, saw Guy Franco, dietician yesterday, energy level, pretty good stated,  Gave 1 month follow up appt with Alean Rinne 3:45 PM BP 111/76 (BP Location: Left Arm, Patient Position: Sitting, Cuff Size: Normal)   Pulse 69   Temp 97.9 F (36.6 C) (Oral)   Resp 20   Wt 174 lb (78.9 kg)   SpO2 100% Comment: room air  BMI 25.70 kg/m   Wt Readings from Last 3 Encounters:  01/09/16 174 lb (78.9 kg)  01/08/16 173 lb 14.4 oz (78.9 kg)  01/03/16 173 lb 9.6 oz (78.7 kg)

## 2016-01-10 ENCOUNTER — Ambulatory Visit
Admission: RE | Admit: 2016-01-10 | Discharge: 2016-01-10 | Disposition: A | Discharge status: Home / Self Care | Payer: Managed Care, Other (non HMO) | Source: Ambulatory Visit | Attending: Radiation Oncology | Admitting: Radiation Oncology

## 2016-01-10 ENCOUNTER — Ambulatory Visit: Payer: Managed Care, Other (non HMO)

## 2016-01-10 ENCOUNTER — Telehealth: Payer: Self-pay | Admitting: *Deleted

## 2016-01-10 ENCOUNTER — Other Ambulatory Visit: Payer: Self-pay | Admitting: Radiation Oncology

## 2016-01-10 VITALS — BP 125/81 | HR 70 | Temp 97.7°F | Resp 16

## 2016-01-10 DIAGNOSIS — Z800 Family history of malignant neoplasm of digestive organs: Secondary | ICD-10-CM | POA: Diagnosis not present

## 2016-01-10 DIAGNOSIS — C154 Malignant neoplasm of middle third of esophagus: Secondary | ICD-10-CM | POA: Diagnosis not present

## 2016-01-10 DIAGNOSIS — Z7984 Long term (current) use of oral hypoglycemic drugs: Secondary | ICD-10-CM | POA: Diagnosis not present

## 2016-01-10 DIAGNOSIS — R001 Bradycardia, unspecified: Secondary | ICD-10-CM | POA: Diagnosis not present

## 2016-01-10 DIAGNOSIS — R634 Abnormal weight loss: Secondary | ICD-10-CM | POA: Diagnosis not present

## 2016-01-10 DIAGNOSIS — Z7982 Long term (current) use of aspirin: Secondary | ICD-10-CM | POA: Diagnosis not present

## 2016-01-10 DIAGNOSIS — R1310 Dysphagia, unspecified: Secondary | ICD-10-CM | POA: Diagnosis not present

## 2016-01-10 DIAGNOSIS — K219 Gastro-esophageal reflux disease without esophagitis: Secondary | ICD-10-CM | POA: Diagnosis not present

## 2016-01-10 DIAGNOSIS — M546 Pain in thoracic spine: Secondary | ICD-10-CM | POA: Diagnosis not present

## 2016-01-10 NOTE — Telephone Encounter (Signed)
Called patient home  And spoke with the wife, "Patient is alright  Now, just when he lays down hard to breathe", patient was seen yesterday by nursing and Dr. Lisbeth Renshaw, but we will see see the patient again after rad tx and get a set of vitals and our PA,Alison will see the patient, Wifr thanked this Rn for calling back,  Patient 's voiced same side effects  His room air sats were at 100%, RR were even and unlabored,  When he saw Dr. Lisbeth Renshaw yesterday after his treatment 2:28 PM  2:29 PM

## 2016-01-10 NOTE — Telephone Encounter (Signed)
"  Patient and spouse called reporting "He says he can't breath."  Juan [name] says "When I try to lie down I gag.  I cough up phlegm that's white saliva. I have to sit up to breath.  I use the sulcrafate but ir wears off and every two hours I'm having this problem.  I'm propped up on two pillows trying to watch TV.  It burns and I take medicine for reflux."  Will notify Dr. Burr Medico and Radiation.

## 2016-01-11 NOTE — Progress Notes (Signed)
Juan [name] came by clinic today due to concerns with pain with eating, and at night with complaints of post nasal drip that causes coughing at night and sputum production. This started in the last three of four days. He feels that he has so much mucous that this causes it to be difficult to sleep at night. He denies shortness of breath. He has been taking percocet 5/325 one tablet to two tablets in 24 hours. He continues to use carafate TID and q HS. He also continues Prilosec.   Physical Exam: BP 125/81 (BP Location: Left Arm, Patient Position: Sitting, Cuff Size: Normal)   Pulse 70   Temp 97.7 F (36.5 C) (Oral)   Resp 16   SpO2 100%  In general this is a well appearing African American male in no acute distress. He is alert and oriented x4 and appropriate throughout the examination. HEENT reveals that the patient is normocephalic, atraumatic. EOMs are intact. PERRLA. No maxillary or frontal tenderness is noted. Skin is intact without any evidence of gross lesions. Oropharynx is negative for cobblestoning or erythema. Cardiovascular exam reveals a regular rate and rhythm, no clicks rubs or murmurs are auscultated. Chest is clear to auscultation bilaterally. Lymphatic assessment is performed and does not reveal any adenopathy in the cervical, supraclavicular, axillary, or inguinal chains.    A/P: 1. Postnasal drip. Although there isn't any change seen today, it's possible that his symptoms are due to allergic rhinitis. He will try Zyrtec OTC and keep [place] informed if his symptoms do not improve. He is counseled on urgent evaluation in the ED if he feels that he has acute onset of pulmonary or cardiac symptoms. 2. Radiation esophagitis. He will continue carafate and prilosec. I've encouraged him to increase his use of percocet to q 4-6 hours, and to use cathartics. If this does not improve by the start of next week, I'd consider a  fluconazole course. He will keep [place] informed of his  status.     Carola Rhine, PAC

## 2016-01-13 ENCOUNTER — Ambulatory Visit: Payer: Managed Care, Other (non HMO)

## 2016-01-13 ENCOUNTER — Ambulatory Visit
Admission: RE | Admit: 2016-01-13 | Discharge: 2016-01-13 | Disposition: A | Discharge status: Home / Self Care | Payer: Managed Care, Other (non HMO) | Source: Ambulatory Visit | Attending: Radiation Oncology | Admitting: Radiation Oncology

## 2016-01-13 DIAGNOSIS — M546 Pain in thoracic spine: Secondary | ICD-10-CM | POA: Diagnosis not present

## 2016-01-13 DIAGNOSIS — R001 Bradycardia, unspecified: Secondary | ICD-10-CM | POA: Diagnosis not present

## 2016-01-13 DIAGNOSIS — R1310 Dysphagia, unspecified: Secondary | ICD-10-CM | POA: Diagnosis not present

## 2016-01-13 DIAGNOSIS — Z7982 Long term (current) use of aspirin: Secondary | ICD-10-CM | POA: Diagnosis not present

## 2016-01-13 DIAGNOSIS — R634 Abnormal weight loss: Secondary | ICD-10-CM | POA: Diagnosis not present

## 2016-01-13 DIAGNOSIS — K219 Gastro-esophageal reflux disease without esophagitis: Secondary | ICD-10-CM | POA: Diagnosis not present

## 2016-01-13 DIAGNOSIS — Z800 Family history of malignant neoplasm of digestive organs: Secondary | ICD-10-CM | POA: Diagnosis not present

## 2016-01-13 DIAGNOSIS — C154 Malignant neoplasm of middle third of esophagus: Secondary | ICD-10-CM | POA: Diagnosis not present

## 2016-01-13 DIAGNOSIS — Z7984 Long term (current) use of oral hypoglycemic drugs: Secondary | ICD-10-CM | POA: Diagnosis not present

## 2016-01-14 ENCOUNTER — Encounter: Payer: Self-pay | Admitting: Radiation Oncology

## 2016-01-14 ENCOUNTER — Ambulatory Visit
Admission: RE | Admit: 2016-01-14 | Discharge: 2016-01-14 | Disposition: A | Discharge status: Home / Self Care | Payer: Managed Care, Other (non HMO) | Source: Ambulatory Visit | Attending: Radiation Oncology | Admitting: Radiation Oncology

## 2016-01-14 ENCOUNTER — Ambulatory Visit: Payer: Managed Care, Other (non HMO)

## 2016-01-14 DIAGNOSIS — R634 Abnormal weight loss: Secondary | ICD-10-CM | POA: Diagnosis not present

## 2016-01-14 DIAGNOSIS — Z7984 Long term (current) use of oral hypoglycemic drugs: Secondary | ICD-10-CM | POA: Diagnosis not present

## 2016-01-14 DIAGNOSIS — R001 Bradycardia, unspecified: Secondary | ICD-10-CM | POA: Diagnosis not present

## 2016-01-14 DIAGNOSIS — R1310 Dysphagia, unspecified: Secondary | ICD-10-CM | POA: Diagnosis not present

## 2016-01-14 DIAGNOSIS — C154 Malignant neoplasm of middle third of esophagus: Secondary | ICD-10-CM | POA: Diagnosis not present

## 2016-01-14 DIAGNOSIS — K219 Gastro-esophageal reflux disease without esophagitis: Secondary | ICD-10-CM | POA: Diagnosis not present

## 2016-01-14 DIAGNOSIS — Z800 Family history of malignant neoplasm of digestive organs: Secondary | ICD-10-CM | POA: Diagnosis not present

## 2016-01-14 DIAGNOSIS — Z7982 Long term (current) use of aspirin: Secondary | ICD-10-CM | POA: Diagnosis not present

## 2016-01-14 DIAGNOSIS — M546 Pain in thoracic spine: Secondary | ICD-10-CM | POA: Diagnosis not present

## 2016-01-14 NOTE — Telephone Encounter (Signed)
Patient was calle,d and seen by Shona Simpson, PA

## 2016-01-15 ENCOUNTER — Ambulatory Visit: Payer: Managed Care, Other (non HMO)

## 2016-01-15 ENCOUNTER — Ambulatory Visit (HOSPITAL_BASED_OUTPATIENT_CLINIC_OR_DEPARTMENT_OTHER): Payer: Managed Care, Other (non HMO) | Admitting: Hematology

## 2016-01-15 ENCOUNTER — Encounter: Payer: Self-pay | Admitting: Hematology

## 2016-01-15 ENCOUNTER — Other Ambulatory Visit (HOSPITAL_BASED_OUTPATIENT_CLINIC_OR_DEPARTMENT_OTHER): Payer: Managed Care, Other (non HMO)

## 2016-01-15 ENCOUNTER — Ambulatory Visit (HOSPITAL_BASED_OUTPATIENT_CLINIC_OR_DEPARTMENT_OTHER): Payer: Managed Care, Other (non HMO)

## 2016-01-15 ENCOUNTER — Telehealth: Payer: Self-pay | Admitting: Hematology

## 2016-01-15 ENCOUNTER — Telehealth: Payer: Self-pay

## 2016-01-15 DIAGNOSIS — Z5111 Encounter for antineoplastic chemotherapy: Secondary | ICD-10-CM

## 2016-01-15 DIAGNOSIS — C154 Malignant neoplasm of middle third of esophagus: Secondary | ICD-10-CM

## 2016-01-15 DIAGNOSIS — Z95828 Presence of other vascular implants and grafts: Secondary | ICD-10-CM

## 2016-01-15 DIAGNOSIS — R1310 Dysphagia, unspecified: Secondary | ICD-10-CM

## 2016-01-15 LAB — COMPREHENSIVE METABOLIC PANEL
ALT: 7 U/L (ref 0–55)
AST: 13 U/L (ref 5–34)
Albumin: 3.4 g/dL — ABNORMAL LOW (ref 3.5–5.0)
Alkaline Phosphatase: 84 U/L (ref 40–150)
Anion Gap: 8 mEq/L (ref 3–11)
BUN: 10.6 mg/dL (ref 7.0–26.0)
CO2: 26 mEq/L (ref 22–29)
Calcium: 9 mg/dL (ref 8.4–10.4)
Chloride: 106 mEq/L (ref 98–109)
Creatinine: 0.8 mg/dL (ref 0.7–1.3)
EGFR: >90 mL/min/{1.73_m2} (ref >90)
Glucose: 111 mg/dl (ref 70–140)
Potassium: 4 mEq/L (ref 3.5–5.1)
Sodium: 140 mEq/L (ref 136–145)
Total Bilirubin: 0.3 mg/dL (ref 0.20–1.20)
Total Protein: 6.8 g/dL (ref 6.4–8.3)

## 2016-01-15 LAB — CBC WITH DIFFERENTIAL/PLATELET
BASO%: 0.4 % (ref 0.0–2.0)
Basophils Absolute: 0 10*3/uL (ref 0.0–0.1)
EOS%: 2.5 % (ref 0.0–7.0)
Eosinophils Absolute: 0.1 10*3/uL (ref 0.0–0.5)
HCT: 33.9 % — ABNORMAL LOW (ref 38.4–49.9)
HGB: 10.8 g/dL — ABNORMAL LOW (ref 13.0–17.1)
LYMPH%: 13.6 % — ABNORMAL LOW (ref 14.0–49.0)
MCH: 23.2 pg — ABNORMAL LOW (ref 27.2–33.4)
MCHC: 31.9 g/dL — ABNORMAL LOW (ref 32.0–36.0)
MCV: 72.7 fL — ABNORMAL LOW (ref 79.3–98.0)
MONO#: 0.2 10*3/uL (ref 0.1–0.9)
MONO%: 9.5 % (ref 0.0–14.0)
NEUT#: 1.8 10*3/uL (ref 1.5–6.5)
NEUT%: 74 % (ref 39.0–75.0)
Platelets: 160 10*3/uL (ref 140–400)
RBC: 4.66 10*6/uL (ref 4.20–5.82)
RDW: 16.1 % — ABNORMAL HIGH (ref 11.0–14.6)
WBC: 2.4 10*3/uL — ABNORMAL LOW (ref 4.0–10.3)
lymph#: 0.3 10*3/uL — ABNORMAL LOW (ref 0.9–3.3)

## 2016-01-15 MED ORDER — PALONOSETRON HCL INJECTION 0.25 MG/5ML
0.2500 mg | Freq: Once | INTRAVENOUS | Status: AC
  Administered 2016-01-15: 0.25 mg via INTRAVENOUS

## 2016-01-15 MED ORDER — PALONOSETRON HCL INJECTION 0.25 MG/5ML
INTRAVENOUS | Status: AC
  Filled 2016-01-15: qty 5

## 2016-01-15 MED ORDER — SODIUM CHLORIDE 0.9% FLUSH
10.0000 mL | INTRAVENOUS | Status: DC | PRN
  Administered 2016-01-15: 10 mL
  Filled 2016-01-15: qty 10

## 2016-01-15 MED ORDER — ONDANSETRON HCL 8 MG PO TABS: 8.0000 mg | ORAL_TABLET | Freq: Two times a day (BID) | ORAL | 1 refills | Status: DC | PRN

## 2016-01-15 MED ORDER — SODIUM CHLORIDE 0.9 % IV SOLN
193.4000 mg | Freq: Once | INTRAVENOUS | Status: AC
  Administered 2016-01-15: 190 mg via INTRAVENOUS
  Filled 2016-01-15: qty 19

## 2016-01-15 MED ORDER — PACLITAXEL PROTEIN-BOUND CHEMO INJECTION 100 MG
45.0000 mg/m2 | Freq: Once | INTRAVENOUS | Status: AC
  Administered 2016-01-15: 100 mg via INTRAVENOUS
  Filled 2016-01-15: qty 20

## 2016-01-15 MED ORDER — PROCHLORPERAZINE MALEATE 10 MG PO TABS: 10.0000 mg | ORAL_TABLET | Freq: Four times a day (QID) | ORAL | 1 refills | Status: DC | PRN

## 2016-01-15 MED ORDER — SODIUM CHLORIDE 0.9 % IJ SOLN
10.0000 mL | INTRAMUSCULAR | Status: DC | PRN
  Administered 2016-01-15: 10 mL via INTRAVENOUS
  Filled 2016-01-15: qty 10

## 2016-01-15 MED ORDER — SODIUM CHLORIDE 0.9 % IV SOLN
Freq: Once | INTRAVENOUS | Status: AC
  Administered 2016-01-15: 13:00:00 via INTRAVENOUS

## 2016-01-15 MED ORDER — HEPARIN SOD (PORK) LOCK FLUSH 100 UNIT/ML IV SOLN
500.0000 [IU] | Freq: Once | INTRAVENOUS | Status: AC | PRN
  Administered 2016-01-15: 500 [IU]
  Filled 2016-01-15: qty 5

## 2016-01-15 NOTE — Telephone Encounter (Signed)
Faxed FMLA papers to [employer]

## 2016-01-15 NOTE — Telephone Encounter (Signed)
Avs report and appointment schedule given to patient per 01/15/16 los. °

## 2016-01-15 NOTE — Patient Instructions (Signed)
[facility] Discharge Instructions for Patients Receiving Chemotherapy  Today you received the following chemotherapy agents:  Abraxane and Carboplatin.  To help prevent nausea and vomiting after your treatment, we encourage you to take your nausea medication as prescribed.   If you develop nausea and vomiting that is not controlled by your nausea medication, call the clinic.   BELOW ARE SYMPTOMS THAT SHOULD BE REPORTED IMMEDIATELY:  *FEVER GREATER THAN 100.5 F  *CHILLS WITH OR WITHOUT FEVER  NAUSEA AND VOMITING THAT IS NOT CONTROLLED WITH YOUR NAUSEA MEDICATION  *UNUSUAL SHORTNESS OF BREATH  *UNUSUAL BRUISING OR BLEEDING  TENDERNESS IN MOUTH AND THROAT WITH OR WITHOUT PRESENCE OF ULCERS  *URINARY PROBLEMS  *BOWEL PROBLEMS  UNUSUAL RASH Items with * indicate a potential emergency and should be followed up as soon as possible.  Feel free to call the clinic you have any questions or concerns. The clinic phone number is [phone].  Please show the CHEMO ALERT CARD at check-in to the Emergency Department and triage nurse.   

## 2016-01-15 NOTE — Progress Notes (Signed)
[name]  [phone] [phone]  Clinic follow Up Note   Patient Care Team: Rama (Merrilee Seashore) [name], MD (Inactive) as PCP - General (Internal Medicine) [name], MD as Consulting Physician (Hematology) Kyung Rudd, MD as Consulting Physician (Radiation Oncology) 01/15/2016   CHIEF COMPLAINTS:  Follow up esophageal squamous cell carcinoma  Oncology History   Presented with dysphagia and odynophagia with 20-25 pounds of weight loss in about 3-4 months  Esophageal cancer ([place])   Staging form: Esophagus - Adenocarcinoma, AJCC 7th Edition   - Clinical stage from 11/13/2015: Stage IIIB (T3, N2, M0, G2) - Signed by [name], MD on 12/11/2015      Esophageal cancer ([place])   11/13/2015 Procedure    UPPER ENDOSCOPY per Dr. Collene Mares: Large fungating, friable bleeding mass in middle third of esophagus, 22cm from incisors and extended to 27cm. Non-obstructing.      11/13/2015 Pathology Results    Invasive squamous cell carcinoma; moderately differentiated      11/13/2015 Imaging    CT ABD/PELVIS: IMPRESSION:No evidence of metastatic disease in the abdomen or pelvis. 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 Aortoiliac atherosclerosis. Small bilateral inguinal hernias containing fat the      11/22/2015 Initial Diagnosis    Esophageal cancer ([place])     12/03/2015 Imaging    PET scan showed a long segment of hypermetabolic thickening in the mid esophagus consistent with esophageal carcinoma. No clear evidence of pedis tunnel node metastasis or distant metastasis.      12/10/2015 -  Radiation Therapy    Neoadjuvant radiation to his esophageal cancer      12/10/2015 -  Chemotherapy    Neoadjuvant weekly carboplatin AUC 2, and Taxol 45 mg/m, with concurrent radiation. He developed steroid-induced psychosis, and Taxol was changed to Abraxane to avoid premedication with steroids.       HISTORY OF PRESENTING ILLNESS:  Juan [name] 75 y.o. male is here  because of His recently diagnosed esophageal cancer. He is accompanied by his wife to my clinic today.  He started having sore throat and dysphagia about 4-5 months ago, and was seen by PCP, had 2 course antibiotics but is symptom progressed. He was referred to gastroenterologist Dr. Suezanne Cheshire, and underwent EGD on 11/13/2015, which showed a large fungating, friable bleeding mass in the middle third of esophagus, 22 cm to 27 cm from incisors, non-obstructing. The biopsy showed squamous cell carcinoma. His CT abdomen and pelvis was negative for metastatic disease.  He has lost 20lbs, he is on soft diet and liquids, his pain is about 7/10 pain with swallowing. He also has chest pain , intermittent, it lasts about a few minutes, no nausea or vomiting, his bowel movement is normal. He denies melena or hematochezia. He is able to function and tolerated light activities at home.  CURRENT THERAPY: concurrent chemotherapy and radiation, with weekly carboplatin AUC 2 and Taxol (changed to Abraxane from weeks 3) 45 mg/m, started on 12/10/2015  INTERIM HISTORY: Mr Juan [name] returns for follow-up and last cycle chemotherapy. He completed radiation yesterday. He has tolerated treatment well so far, he still has moderate dysphagia, odynophagia has improved, he takes oxycodone a few tablets a day. He also complains of numbness of his left hand, no neuropathy on his right hand or feet. He has mild constipation, takes MiraLAX, no other new complaints.  MEDICAL HISTORY:  Past Medical History:  Diagnosis Date  . Esophageal cancer ([place])   . Reflux     SURGICAL HISTORY: Past  Surgical History:  Procedure Laterality Date  . EUS N/A 12/05/2015   Procedure: UPPER ENDOSCOPIC ULTRASOUND (EUS) LINEAR;  Surgeon: Carol Ada, MD;  Location: [facility];  Service: Endoscopy;  Laterality: N/A;  . HERNIA REPAIR    . IR GENERIC HISTORICAL  12/24/2015   IR FLUORO GUIDE PORT INSERTION RIGHT 12/24/2015 Arne Cleveland, MD [facility]-INTER[facility] RAD    . IR GENERIC HISTORICAL  12/24/2015   IR US GUIDE VASC ACCESS RIGHT 12/24/2015 Arne Cleveland, MD [facility]  . SHOULDER ARTHROSCOPY W/ SUPERIOR LABRAL ANTERIOR POSTERIOR LESION REPAIR      SOCIAL HISTORY: Social History   Social History  . Marital status: Married    Spouse name: N/A  . Number of children: N/A  . Years of education: N/A   Occupational History  . Not on file.   Social History Main Topics  . Smoking status: Former Smoker    Packs/day: 2.00    Years: 40.00    Quit date: 03/31/1999  . Smokeless tobacco: Never Used  . Alcohol use No     Comment: weekend binge drinking for 30-40 years, quit in 2001  . Drug use: No  . Sexual activity: Not on file   Other Topics Concern  . 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 Colon cancer Father 62  . Colon cancer Brother 44    ALLERGIES:  has No Known Allergies.  MEDICATIONS:  Current Outpatient Prescriptions  Medication Sig Dispense Refill  . albuterol (PROVENTIL HFA;VENTOLIN HFA) 108 (90 BASE) MCG/ACT inhaler Inhale 2 puffs into the lungs every 4 (four) hours as needed for wheezing. 1 Inhaler 0  . amitriptyline (ELAVIL) 10 MG tablet Take 10 mg by mouth at bedtime. 11/16/15 - Take 1 tablet at Bedtime x 1 Week, then increase to 2 tablets Once Daily Orally- 30 days    . aspirin EC 81 MG tablet Take 81 mg by mouth daily.    Marland Kitchen atorvastatin (LIPITOR) 20 MG tablet Take 20 mg by mouth at bedtime.     . lidocaine-prilocaine (EMLA) cream Apply 1 application topically as needed. 30 g 1  . metFORMIN (GLUCOPHAGE) 500 MG tablet Take 500 mg by mouth 2 (two) times daily with a meal.     . omeprazole (PRILOSEC) 40 MG capsule Take 40 mg by mouth daily.     . ondansetron (ZOFRAN) 8 MG tablet  Take 1 tablet (8 mg total) by mouth 2 (two) times daily as needed for refractory nausea / vomiting. Start on day 3 after chemo. 30 tablet 1  . oxyCODONE-acetaminophen (PERCOCET/ROXICET) 5-325 MG tablet Take 1-2 tablets by mouth every 6 (six) hours as needed for severe pain. 90 tablet 0  . prochlorperazine (COMPAZINE) 10 MG tablet Take 1 tablet (10 mg total) by mouth every 6 (six) hours as needed (Nausea or vomiting). 30 tablet 1  . sucralfate (CARAFATE) 1 g tablet Take 1 tablet (1 g total) by mouth 4 (four) times daily. 120 tablet 2  . Wound Dressings (SONAFINE) Apply 1 application topically daily.     No current facility-administered medications for this visit.    Facility-Administered Medications Ordered in Other Visits  Medication Dose Route Frequency Provider Last Rate Last Dose  . sodium chloride  0.9 % injection 10 mL  10 mL Intravenous PRN [name], MD   10 mL at 01/15/16 1039    REVIEW OF SYSTEMS:   Constitutional: Denies fevers, chills or abnormal night sweats Eyes: Denies blurriness of vision, double vision or watery eyes Ears, nose, mouth, throat, and face: Denies mucositis or sore throat Respiratory: Denies cough, dyspnea or wheezes Cardiovascular: Denies palpitation, chest discomfort or lower extremity swelling Gastrointestinal:  Denies nausea, heartburn or change in bowel habits Skin: Denies abnormal skin rashes Lymphatics: Denies new lymphadenopathy or easy bruising Neurological:Denies numbness, tingling or new weaknesses Behavioral/Psych: Mood is stable, no new changes  All other systems were reviewed with the patient and are negative.  PHYSICAL EXAMINATION: ECOG PERFORMANCE STATUS: 1 - Symptomatic but completely ambulatory  Vitals:   01/15/16 1107  BP: 119/70  Pulse: 71  Resp: 18  Temp: 97.8 F (36.6 C)   Filed Weights   01/15/16 1107  Weight: 174 lb (78.9 kg)    GENERAL:alert, no distress and comfortable SKIN: skin color, texture, turgor are normal, no  rashes or significant lesions EYES: normal, conjunctiva are pink and non-injected, sclera clear OROPHARYNX:no exudate, no erythema and lips, buccal mucosa, and tongue normal  NECK: supple, thyroid normal size, non-tender, without nodularity LYMPH:  no palpable lymphadenopathy in the cervical, axillary or inguinal LUNGS: clear to auscultation and percussion with normal breathing effort HEART: regular rate & rhythm and no murmurs and no lower extremity edema ABDOMEN:abdomen soft, non-tender and normal bowel sounds Musculoskeletal:no cyanosis of digits and no clubbing  PSYCH: alert & oriented x 3 with fluent speech NEURO: no focal motor/sensory deficits  LABORATORY DATA:  I have reviewed the data as listed CBC Latest Ref Rng & Units 01/15/2016 01/08/2016 01/01/2016  WBC 4.0 - 10.3 10e3/uL 2.4(L) 2.0(L) 2.5(L)  Hemoglobin 13.0 - 17.1 g/dL 10.8(L) 11.1(L) 11.2(L)  Hematocrit 38.4 - 49.9 % 33.9(L) 34.6(L) 35.0(L)  Platelets 140 - 400 10e3/uL 160 195 223   CMP Latest Ref Rng & Units 01/15/2016 01/08/2016 01/01/2016  Glucose 70 - 140 mg/dl 111 110 103  BUN 7.0 - 26.0 mg/dL 10.6 10.8 14.3  Creatinine 0.7 - 1.3 mg/dL 0.8 0.8 0.8  Sodium 136 - 145 mEq/L 140 140 139  Potassium 3.5 - 5.1 mEq/L 4.0 3.8 3.9  Chloride 101 - 111 mmol/L - - -  CO2 22 - 29 mEq/L 26 24 26   Calcium 8.4 - 10.4 mg/dL 9.0 8.9 9.4  Total Protein 6.4 - 8.3 g/dL 6.8 6.9 7.1  Total Bilirubin 0.20 - 1.20 mg/dL 0.30 0.36 0.37  Alkaline Phos 40 - 150 U/L 84 79 81  AST 5 - 34 U/L 13 14 11   ALT 0 - 55 U/L 7 10 10     PATHOLOGY REPORT  Final microscopic diagnosis Esophagus, 0.2 cm to 27 cm, biopsy -Invasive squamous cell carcinoma, moderately differentiated   RADIOGRAPHIC STUDIES: I have personally reviewed the radiological images as listed and agreed with the findings in the report. Ct Head Wo Contrast  Result Date: 12/19/2015 CLINICAL DATA:  Patient with history of psychiatric issues. History of esophageal carcinoma. EXAM:  CT HEAD WITHOUT CONTRAST TECHNIQUE: Contiguous axial images were obtained from the base of the skull through the vertex without intravenous contrast. COMPARISON:  Brain CT 01/05/2014 FINDINGS: Brain: Ventricles and sulci are appropriate for patient's age. No evidence for acute cortically based infarct, intracranial hemorrhage, mass lesion or mass-effect. Vascular: No hyperdense vessel or unexpected calcification. Skull: Normal. Negative for fracture or focal lesion. Sinuses/Orbits: No acute  finding. Other: None. IMPRESSION: No acute intracranial process. Electronically Signed   By: Lovey Newcomer M.D.   On: 12/19/2015 15:51   Ir US Guide Vasc Access Right  Result Date: 12/24/2015 CLINICAL DATA:  Esophageal carcinoma, needs durable venous access for chemotherapy regimen EXAM: TUNNELED PORT CATHETER PLACEMENT WITH ULTRASOUND AND FLUOROSCOPIC GUIDANCE FLUOROSCOPY TIME:  0.2 minute, 37  uGym2 DAP ANESTHESIA/SEDATION: Intravenous Fentanyl and Versed were administered as conscious sedation during continuous monitoring of the patient's level of consciousness and physiological / cardiorespiratory status by the radiology RN, with a total moderate sedation time of 18 minutes. TECHNIQUE: The procedure, risks, benefits, and alternatives were explained to the patient. Questions regarding the procedure were encouraged and answered. The patient understands and consents to the procedure. As antibiotic prophylaxis, cefazolin 2 g was ordered pre-procedure and administered intravenously within one hour of incision. Patency of the right IJ vein was confirmed with ultrasound with image documentation. An appropriate skin site was determined. Skin site was marked. Region was prepped using maximum barrier technique including cap and mask, sterile gown, sterile gloves, large sterile sheet, and Chlorhexidine as cutaneous antisepsis. The region was infiltrated locally with 1% lidocaine. Under real-time ultrasound guidance, the right IJ vein  was accessed with a 21 gauge micropuncture needle; the needle tip within the vein was confirmed with ultrasound image documentation. Needle was exchanged over a 018 guidewire for transitional dilator which allowed passage of the [facility] wire into the IVC. Over this, the transitional dilator was exchanged for a 5 [place] MPA catheter. A small incision was made on the right anterior chest wall and a subcutaneous pocket fashioned. The power-injectable port was positioned and its catheter tunneled to the right IJ dermatotomy site. The MPA catheter was exchanged over an Amplatz wire for a peel-away sheath, through which the port catheter, which had been trimmed to the appropriate length, was advanced and positioned under fluoroscopy with its tip at the cavoatrial junction. Spot chest radiograph confirms good catheter position and no pneumothorax. The pocket was closed with deep interrupted and subcuticular continuous 3-0 Monocryl sutures. The port was flushed per protocol. The incisions were covered with Dermabond then covered with a sterile dressing. COMPLICATIONS: COMPLICATIONS None immediate IMPRESSION: Technically successful right IJ power-injectable port catheter placement. Ready for routine use. Electronically Signed   By: Lucrezia Europe M.D.   On: 12/24/2015 16:26   Ir Fluoro Guide Port Insertion Right  Result Date: 12/24/2015 CLINICAL DATA:  Esophageal carcinoma, needs durable venous access for chemotherapy regimen EXAM: TUNNELED PORT CATHETER PLACEMENT WITH ULTRASOUND AND FLUOROSCOPIC GUIDANCE FLUOROSCOPY TIME:  0.2 minute, 37  uGym2 DAP ANESTHESIA/SEDATION: Intravenous Fentanyl and Versed were administered as conscious sedation during continuous monitoring of the patient's level of consciousness and physiological / cardiorespiratory status by the radiology RN, with a total moderate sedation time of 18 minutes. TECHNIQUE: The procedure, risks, benefits, and alternatives were explained to the patient. Questions  regarding the procedure were encouraged and answered. The patient understands and consents to the procedure. As antibiotic prophylaxis, cefazolin 2 g was ordered pre-procedure and administered intravenously within one hour of incision. Patency of the right IJ vein was confirmed with ultrasound with image documentation. An appropriate skin site was determined. Skin site was marked. Region was prepped using maximum barrier technique including cap and mask, sterile gown, sterile gloves, large sterile sheet, and Chlorhexidine as cutaneous antisepsis. The region was infiltrated locally with 1% lidocaine. Under real-time ultrasound guidance, the right IJ vein was accessed with a 21 gauge micropuncture  needle; the needle tip within the vein was confirmed with ultrasound image documentation. Needle was exchanged over a 018 guidewire for transitional dilator which allowed passage of the [facility] wire into the IVC. Over this, the transitional dilator was exchanged for a 5 [place] MPA catheter. A small incision was made on the right anterior chest wall and a subcutaneous pocket fashioned. The power-injectable port was positioned and its catheter tunneled to the right IJ dermatotomy site. The MPA catheter was exchanged over an Amplatz wire for a peel-away sheath, through which the port catheter, which had been trimmed to the appropriate length, was advanced and positioned under fluoroscopy with its tip at the cavoatrial junction. Spot chest radiograph confirms good catheter position and no pneumothorax. The pocket was closed with deep interrupted and subcuticular continuous 3-0 Monocryl sutures. The port was flushed per protocol. The incisions were covered with Dermabond then covered with a sterile dressing. COMPLICATIONS: COMPLICATIONS None immediate IMPRESSION: Technically successful right IJ power-injectable port catheter placement. Ready for routine use. Electronically Signed   By: Lucrezia Europe M.D.   On: 12/24/2015 16:26    EGD 11/13/2015 Dr. Collene Mares  -A large, fungating, friable mass with bleeding was found in the mid third of the esophagus, 22 cm from the incisors and extended to 27 cm. The mass was nonobstructing and a circumferential, biopsy was taken. -Normal stomach -Normal proximal small bowel  Colonoscopy 11/13/2015 Dr. Collene Mares -Diverticulosis in the entire exam: -No specimens collected  EUS 12/05/2015 Malignant esophageal tumor was found in the upper third of the esophagus and in the middle third of the esophagus. - A mass was found in the cervical esophagus and in the thoracic esophagus. The diagnosis is squamous cell carcinoma. This was staged T3 N1/N2 Mx by endosonographic criteria. - No specimens collected.   ASSESSMENT & PLAN: 75 year old gentleman, without significant past medical history except acid reflux, presented with progressive dysphagia and odynophagia and weight loss.  1. Esophageal cancer, squamous cell carcinoma, cT3N1-2M0, stage IIIB -I reviewed his EGD findings, biopsy results, and the CT scan findings with patient and his wife in great details -I dicussed staging PET scan findings with patient and his wife, the mid esophageal mass is hypermetabolic, no distant metastasis. -EUS revealed a T3 lesion, N1 vs N2, locally advanced disease. 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 Servando Snare on 01/30/2016  2. Dysphagia  -He is able to tolerate a soft diet and drinks fluids adequately -follow up with dietician Pamala Hurry  -He has started gaining some weight back in the past few weeks  3. Odynophagia -Dr. Lisbeth Renshaw has  prescribed Percocet as needed for his pain, he will continue , he does not use much  4. Steroids induced psychosis -Resolved now. We'll avoid steroids in the future.  Recommendations -Lab results reviewed, adequate for treatment, we'll proceed 5th week chemotherapy carbo and Abraxane today -Return to clinic on November 3 for follow-up  All questions were answered. The patient knows to call the clinic with any problems, questions or concerns.  I spent 15 minutes counseling the patient face to face. The total time spent in the appointment was 20 minutes and more than 50% was on counseling.  [name], MD 01/15/2016

## 2016-01-16 ENCOUNTER — Telehealth: Payer: Self-pay

## 2016-01-16 ENCOUNTER — Encounter: Payer: Self-pay | Admitting: Hematology

## 2016-01-16 ENCOUNTER — Encounter: Payer: Managed Care, Other (non HMO) | Admitting: Nutrition

## 2016-01-16 ENCOUNTER — Ambulatory Visit: Payer: Managed Care, Other (non HMO)

## 2016-01-16 NOTE — Telephone Encounter (Signed)
Faxed office note to [facility] absence one per request

## 2016-01-17 ENCOUNTER — Ambulatory Visit: Payer: Managed Care, Other (non HMO)

## 2016-01-20 ENCOUNTER — Telehealth: Payer: Self-pay | Admitting: *Deleted

## 2016-01-20 NOTE — Telephone Encounter (Signed)
OK to change to oxycodone 10mg  q4hrs as needed  [name] MD

## 2016-01-20 NOTE — Telephone Encounter (Signed)
Spouse Margaretha Sheffield calling requesting "Something for pain because the Oxycodone 5-325 mg is not working.  He takes this around the clock but rates pain as eight out of ten on pain scale.  He's hurt all weekend from his Head, neck down to his chest.  Pain comes and goes.  He has a deeper cough with clear phlegm.  No fever."

## 2016-01-21 ENCOUNTER — Other Ambulatory Visit: Payer: Self-pay | Admitting: Hematology

## 2016-01-21 MED ORDER — OXYCODONE HCL 15 MG PO TABS: 15.0000 mg | ORAL_TABLET | ORAL | 0 refills | Status: DC | PRN

## 2016-01-29 ENCOUNTER — Encounter: Payer: Self-pay | Admitting: Hematology

## 2016-01-29 ENCOUNTER — Ambulatory Visit (HOSPITAL_BASED_OUTPATIENT_CLINIC_OR_DEPARTMENT_OTHER): Payer: Managed Care, Other (non HMO) | Admitting: Hematology

## 2016-01-29 ENCOUNTER — Other Ambulatory Visit (HOSPITAL_BASED_OUTPATIENT_CLINIC_OR_DEPARTMENT_OTHER): Payer: Managed Care, Other (non HMO)

## 2016-01-29 ENCOUNTER — Ambulatory Visit (HOSPITAL_BASED_OUTPATIENT_CLINIC_OR_DEPARTMENT_OTHER): Payer: Managed Care, Other (non HMO)

## 2016-01-29 ENCOUNTER — Telehealth: Payer: Self-pay | Admitting: Cardiovascular Disease

## 2016-01-29 ENCOUNTER — Encounter (HOSPITAL_COMMUNITY): Payer: Self-pay | Admitting: *Deleted

## 2016-01-29 ENCOUNTER — Ambulatory Visit (HOSPITAL_COMMUNITY)
Admission: EM | Admit: 2016-01-29 | Discharge: 2016-01-29 | Disposition: A | Discharge status: Home / Self Care | Payer: Managed Care, Other (non HMO) | Attending: Family Medicine | Admitting: Family Medicine

## 2016-01-29 ENCOUNTER — Telehealth: Payer: Self-pay | Admitting: Hematology

## 2016-01-29 VITALS — BP 113/80 | HR 85 | Temp 98.3°F | Resp 18 | Ht 69.0 in | Wt 174.2 lb

## 2016-01-29 DIAGNOSIS — C154 Malignant neoplasm of middle third of esophagus: Secondary | ICD-10-CM

## 2016-01-29 DIAGNOSIS — Z95828 Presence of other vascular implants and grafts: Secondary | ICD-10-CM

## 2016-01-29 DIAGNOSIS — Z452 Encounter for adjustment and management of vascular access device: Secondary | ICD-10-CM

## 2016-01-29 DIAGNOSIS — R339 Retention of urine, unspecified: Secondary | ICD-10-CM

## 2016-01-29 DIAGNOSIS — R1310 Dysphagia, unspecified: Secondary | ICD-10-CM

## 2016-01-29 LAB — CBC WITH DIFFERENTIAL/PLATELET
BASO%: 1 % (ref 0.0–2.0)
Basophils Absolute: 0 10*3/uL (ref 0.0–0.1)
EOS%: 4 % (ref 0.0–7.0)
Eosinophils Absolute: 0.1 10*3/uL (ref 0.0–0.5)
HCT: 35.9 % — ABNORMAL LOW (ref 38.4–49.9)
HGB: 11.2 g/dL — ABNORMAL LOW (ref 13.0–17.1)
LYMPH%: 15.8 % (ref 14.0–49.0)
MCH: 22.8 pg — ABNORMAL LOW (ref 27.2–33.4)
MCHC: 31.2 g/dL — ABNORMAL LOW (ref 32.0–36.0)
MCV: 73.2 fL — ABNORMAL LOW (ref 79.3–98.0)
MONO#: 0.5 10*3/uL (ref 0.1–0.9)
MONO%: 24.5 % — ABNORMAL HIGH (ref 0.0–14.0)
NEUT#: 1.2 10*3/uL — ABNORMAL LOW (ref 1.5–6.5)
NEUT%: 54.7 % (ref 39.0–75.0)
Platelets: 223 10*3/uL (ref 140–400)
RBC: 4.9 10*6/uL (ref 4.20–5.82)
RDW: 16.7 % — ABNORMAL HIGH (ref 11.0–14.6)
WBC: 2.1 10*3/uL — ABNORMAL LOW (ref 4.0–10.3)
lymph#: 0.3 10*3/uL — ABNORMAL LOW (ref 0.9–3.3)

## 2016-01-29 LAB — POCT URINALYSIS DIP (DEVICE)
Bilirubin Urine: NEGATIVE
Glucose, UA: NEGATIVE mg/dL
Ketones, ur: NEGATIVE mg/dL
Leukocytes, UA: NEGATIVE
Nitrite: NEGATIVE
Protein, ur: NEGATIVE mg/dL
Specific Gravity, Urine: 1.025 (ref 1.005–1.030)
Urobilinogen, UA: 0.2 mg/dL (ref 0.0–1.0)
pH: 5.5 (ref 5.0–8.0)

## 2016-01-29 LAB — COMPREHENSIVE METABOLIC PANEL
ALT: 12 U/L (ref 0–55)
AST: 14 U/L (ref 5–34)
Albumin: 3.4 g/dL — ABNORMAL LOW (ref 3.5–5.0)
Alkaline Phosphatase: 76 U/L (ref 40–150)
Anion Gap: 9 mEq/L (ref 3–11)
BUN: 10 mg/dL (ref 7.0–26.0)
CO2: 25 mEq/L (ref 22–29)
Calcium: 9.2 mg/dL (ref 8.4–10.4)
Chloride: 105 mEq/L (ref 98–109)
Creatinine: 0.8 mg/dL (ref 0.7–1.3)
EGFR: >90 mL/min/{1.73_m2} (ref >90)
Glucose: 106 mg/dl (ref 70–140)
Potassium: 3.8 mEq/L (ref 3.5–5.1)
Sodium: 139 mEq/L (ref 136–145)
Total Bilirubin: 0.64 mg/dL (ref 0.20–1.20)
Total Protein: 7 g/dL (ref 6.4–8.3)

## 2016-01-29 MED ORDER — TAMSULOSIN HCL 0.4 MG PO CAPS: 0.4000 mg | ORAL_CAPSULE | Freq: Every day | ORAL | 1 refills | Status: DC

## 2016-01-29 MED ORDER — SODIUM CHLORIDE 0.9 % IJ SOLN
10.0000 mL | INTRAMUSCULAR | Status: DC | PRN
  Administered 2016-01-29: 10 mL via INTRAVENOUS
  Filled 2016-01-29: qty 10

## 2016-01-29 MED ORDER — HEPARIN SOD (PORK) LOCK FLUSH 100 UNIT/ML IV SOLN
500.0000 [IU] | Freq: Once | INTRAVENOUS | Status: AC | PRN
  Administered 2016-01-29: 500 [IU] via INTRAVENOUS
  Filled 2016-01-29: qty 5

## 2016-01-29 NOTE — ED Provider Notes (Signed)
[place]    CSN: [id number] Arrival date & time: 01/29/16  1217     History   Chief Complaint Chief Complaint  Patient presents with  . Urinary Retention    HPI Juan [name] is a 75 y.o. male.   The history is provided by the patient and the spouse.  Urinary Frequency  This is a new problem. The current episode started yesterday. The problem has been resolved. Pertinent negatives include no abdominal pain. Nothing aggravates the symptoms. Nothing relieves the symptoms.    Past Medical History:  Diagnosis Date  . Esophageal cancer ([place])   . Reflux     Patient Active Problem List   Diagnosis Date Noted  . Port catheter in place 12/25/2015  . 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 Esophageal cancer ([place]) 11/22/2015    Past Surgical History:  Procedure Laterality Date  . EUS N/A 12/05/2015   Procedure: UPPER ENDOSCOPIC ULTRASOUND (EUS) LINEAR;  Surgeon: Carol Ada, MD;  Location: [facility];  Service: Endoscopy;  Laterality: N/A;  . HERNIA REPAIR    . IR GENERIC HISTORICAL  12/24/2015   IR FLUORO GUIDE PORT INSERTION RIGHT 12/24/2015 Arne Cleveland, MD [facility]  . IR GENERIC HISTORICAL  12/24/2015   IR US GUIDE VASC ACCESS RIGHT 12/24/2015 Arne Cleveland, MD [facility]  . SHOULDER ARTHROSCOPY W/ SUPERIOR LABRAL ANTERIOR POSTERIOR LESION REPAIR         Home Medications    Prior to Admission medications   Medication Sig Start Date End Date Taking? Authorizing Provider  albuterol (PROVENTIL HFA;VENTOLIN HFA) 108 (90 BASE) MCG/ACT inhaler Inhale 2 puffs into the lungs every 4 (four) hours as needed for wheezing. 03/23/12   Rolland Porter, MD  amitriptyline (ELAVIL) 10 MG tablet Take 10 mg by mouth at bedtime. 11/16/15 - Take 1 tablet at Bedtime x 1 Week, then increase to 2 tablets Once Daily Orally- 30 days 11/16/15 11/15/16  Historical Provider, MD  aspirin EC 81 MG tablet Take 81 mg by mouth daily.    Historical  Provider, MD  atorvastatin (LIPITOR) 20 MG tablet Take 20 mg by mouth at bedtime.  11/30/13   Historical Provider, MD  lidocaine-prilocaine (EMLA) cream Apply 1 application topically as needed. 01/01/16   [name], MD  metFORMIN (GLUCOPHAGE) 500 MG tablet Take 500 mg by mouth 2 (two) times daily with a meal.     Historical Provider, MD  omeprazole (PRILOSEC) 40 MG capsule Take 40 mg by mouth daily.  12/18/13   Historical Provider, MD  ondansetron (ZOFRAN) 8 MG tablet Take 1 tablet (8 mg total) by mouth 2 (two) times daily as needed for refractory nausea / vomiting. Start on day 3 after chemo. 01/15/16   [name], MD  oxyCODONE (ROXICODONE) 15 MG immediate release tablet Take 1 tablet (15 mg total) by mouth every 4 (four) hours as needed for pain. 01/21/16   [name], MD  prochlorperazine (COMPAZINE) 10 MG tablet Take 1 tablet (10 mg total) by mouth every 6 (six) hours as needed (Nausea or vomiting). 01/15/16   [name], MD  sucralfate (CARAFATE) 1 g tablet Take 1 tablet (1 g total) by mouth 4 (four) times daily. 12/20/15   Kyung Rudd, MD  Wound Dressings (SONAFINE) Apply 1 application topically daily. 12/10/15   Kyung Rudd, MD    Family History Family History  Problem Relation Age of Onset  . Colon cancer Father 37  . Colon cancer Brother 39  Social History Social History  Substance Use Topics  . Smoking status: Former Smoker    Packs/day: 2.00    Years: 40.00    Quit date: 03/31/1999  . Smokeless tobacco: Never Used  . Alcohol use No     Comment: weekend binge drinking for 30-40 years, quit in 2001     Allergies   Review of patient's allergies indicates no known allergies.   Review of Systems Review of Systems  Constitutional: Negative.   HENT: Negative.   Gastrointestinal: Negative.  Negative for abdominal pain.  Genitourinary: Positive for difficulty urinating and frequency.  All other systems reviewed and are negative.    Physical Exam Triage Vital Signs ED Triage Vitals   Enc Vitals Group     BP 01/29/16 1259 142/78     Pulse Rate 01/29/16 1259 82     Resp 01/29/16 1259 18     Temp 01/29/16 1259 98.6 F (37 C)     Temp src --      SpO2 01/29/16 1259 97 %     Weight --      Height --      Head Circumference --      Peak Flow --      Pain Score 01/29/16 1300 2     Pain Loc --      Pain Edu? --      Excl. in [place]? --    No data found.   Updated Vital Signs BP 142/78 (BP Location: Right Arm)   Pulse 82   Temp 98.6 F (37 C)   Resp 18   SpO2 97%   Visual Acuity Right Eye Distance:   Left Eye Distance:   Bilateral Distance:    Right Eye Near:   Left Eye Near:    Bilateral Near:     Physical Exam  Constitutional: He appears well-developed and well-nourished. No distress.  Abdominal: Soft. Bowel sounds are normal. He exhibits no distension. There is no tenderness. There is no guarding.  Skin: Skin is warm and dry.  Nursing note and vitals reviewed.    UC Treatments / Results  Labs (all labs ordered are listed, but only abnormal results are displayed) Labs Reviewed  POCT URINALYSIS DIP (DEVICE) - Abnormal; Notable for the following:       Result Value   Hgb urine dipstick TRACE (*)    All other components within normal limits    EKG  EKG Interpretation None       Radiology No results found.  Procedures Procedures (including critical care time)  Medications Ordered in UC Medications - No data to display   Initial Impression / Assessment and Plan / UC Course  I have reviewed the triage vital signs and the nursing notes.  Pertinent labs & imaging results that were available during my care of the patient were reviewed by me and considered in my medical decision making (see chart for details).  Clinical Course       Final Clinical Impressions(s) / UC Diagnoses   Final diagnoses:  None    New Prescriptions New Prescriptions   No medications on file     Billy Fischer, MD 01/29/16 1341

## 2016-01-29 NOTE — Progress Notes (Signed)
[place]  [phone] [phone]  Clinic follow Up Note   Patient Care Team: Rama (Merrilee Seashore) [name], MD (Inactive) as PCP - General (Internal Medicine) [name], MD as Consulting Physician (Hematology) Kyung Rudd, MD as Consulting Physician (Radiation Oncology) 01/29/2016   CHIEF COMPLAINTS:  Follow up esophageal squamous cell carcinoma  Oncology History   Presented with dysphagia and odynophagia with 20-25 pounds of weight loss in about 3-4 months  Esophageal cancer ([place])   Staging form: Esophagus - Adenocarcinoma, AJCC 7th Edition   - Clinical stage from 11/13/2015: Stage IIIB (T3, N2, M0, G2) - Signed by [name], MD on 12/11/2015      Esophageal cancer ([place])   11/13/2015 Procedure    UPPER ENDOSCOPY per Dr. Collene Mares: Large fungating, friable bleeding mass in middle third of esophagus, 22cm from incisors and extended to 27cm. Non-obstructing.      11/13/2015 Pathology Results    Invasive squamous cell carcinoma; moderately differentiated      11/13/2015 Imaging    CT ABD/PELVIS: IMPRESSION:No evidence of metastatic disease in the abdomen or pelvis. 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 Aortoiliac atherosclerosis. Small bilateral inguinal hernias containing fat the      11/22/2015 Initial Diagnosis    Esophageal cancer ([place])     12/03/2015 Imaging    PET scan showed a long segment of hypermetabolic thickening in the mid esophagus consistent with esophageal carcinoma. No clear evidence of pedis tunnel node metastasis or distant metastasis.      12/10/2015 -  Radiation Therapy    Neoadjuvant radiation to his esophageal cancer      12/10/2015 -  Chemotherapy    Neoadjuvant weekly carboplatin AUC 2, and Taxol 45 mg/m, with concurrent radiation. He developed steroid-induced psychosis, and Taxol was changed to Abraxane to avoid premedication with steroids.       HISTORY OF PRESENTING ILLNESS:  Juan [name] 75 y.o. male is here  because of His recently diagnosed esophageal cancer. He is accompanied by his wife to my clinic today.  He started having sore throat and dysphagia about 4-5 months ago, and was seen by PCP, had 2 course antibiotics but is symptom progressed. He was referred to gastroenterologist Dr. Suezanne Cheshire, and underwent EGD on 11/13/2015, which showed a large fungating, friable bleeding mass in the middle third of esophagus, 22 cm to 27 cm from incisors, non-obstructing. The biopsy showed squamous cell carcinoma. His CT abdomen and pelvis was negative for metastatic disease.  He has lost 20lbs, he is on soft diet and liquids, his pain is about 7/10 pain with swallowing. He also has chest pain , intermittent, it lasts about a few minutes, no nausea or vomiting, his bowel movement is normal. He denies melena or hematochezia. He is able to function and tolerated light activities at home.  CURRENT THERAPY: concurrent chemotherapy and radiation, with weekly carboplatin AUC 2 and Taxol (changed to Abraxane from weeks 3) 45 mg/m, started on 12/10/2015  INTERIM HISTORY: Juan [name] returns for follow-up Feels better overall, less odynophagia and chest pain, he takes oxycodone 2 tablets a day. He is eating very well with soft diet. Gained a few pounds. Has mild constipation, on miralax He is scheduled to see Dr. Pia Mau tomorrow, saw his primary physician yesterday.  MEDICAL HISTORY:  Past Medical History:  Diagnosis Date  . Esophageal cancer ([place])   . Reflux     SURGICAL HISTORY: Past Surgical History:  Procedure Laterality Date  . EUS N/A 12/05/2015  Procedure: UPPER ENDOSCOPIC ULTRASOUND (EUS) LINEAR;  Surgeon: Carol Ada, MD;  [facility];  Service: Endoscopy;  Laterality: N/A;  . HERNIA REPAIR    . IR GENERIC HISTORICAL  12/24/2015   IR FLUORO GUIDE PORT INSERTION RIGHT 12/24/2015 Arne Cleveland, MD [facility]  . IR GENERIC HISTORICAL  12/24/2015   IR US GUIDE VASC ACCESS RIGHT 12/24/2015 Arne Cleveland, MD [facility]  . SHOULDER ARTHROSCOPY W/ SUPERIOR LABRAL ANTERIOR POSTERIOR LESION REPAIR      SOCIAL HISTORY: Social History   Social History  . Marital status: Married    Spouse name: N/A  . Number of children: N/A  . Years of education: N/A   Occupational History  . Not on file.   Social History Main Topics  . Smoking status: Former Smoker    Packs/day: 2.00    Years: 40.00    Quit date: 03/31/1999  . Smokeless tobacco: Never Used  . Alcohol use No     Comment: weekend binge drinking for 30-40 years, quit in 2001  . Drug use: No  . Sexual activity: Not on file   Other Topics Concern  . 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 Colon cancer Father 73  . Colon cancer Brother 30    ALLERGIES:  has No Known Allergies.  MEDICATIONS:  Current Outpatient Prescriptions  Medication Sig Dispense Refill  . albuterol (PROVENTIL HFA;VENTOLIN HFA) 108 (90 BASE) MCG/ACT inhaler Inhale 2 puffs into the lungs every 4 (four) hours as needed for wheezing. 1 Inhaler 0  . amitriptyline (ELAVIL) 10 MG tablet Take 10 mg by mouth at bedtime. 11/16/15 - Take 1 tablet at Bedtime x 1 Week, then increase to 2 tablets Once Daily Orally- 30 days    . aspirin EC 81 MG tablet Take 81 mg by mouth daily.    Marland Kitchen atorvastatin (LIPITOR) 20 MG tablet Take 20 mg by mouth at bedtime.     . lidocaine-prilocaine (EMLA) cream Apply 1 application topically as needed. 30 g 1  . metFORMIN (GLUCOPHAGE) 500 MG tablet Take 500 mg by mouth 2 (two) times daily with a meal.     . omeprazole (PRILOSEC) 40 MG capsule Take 40 mg by mouth daily.     . ondansetron (ZOFRAN) 8 MG tablet Take 1 tablet (8 mg total) by mouth 2 (two) times daily as needed for refractory nausea /  vomiting. Start on day 3 after chemo. 30 tablet 1  . oxyCODONE (ROXICODONE) 15 MG immediate release tablet Take 1 tablet (15 mg total) by mouth every 4 (four) hours as needed for pain. 60 tablet 0  . prochlorperazine (COMPAZINE) 10 MG tablet Take 1 tablet (10 mg total) by mouth every 6 (six) hours as needed (Nausea or vomiting). 30 tablet 1  . sucralfate (CARAFATE) 1 g tablet Take 1 tablet (1 g total) by mouth 4 (four) times daily. 120 tablet 2  . Wound Dressings (SONAFINE) Apply 1 application topically daily.     No current facility-administered medications for this visit.     REVIEW OF SYSTEMS:   Constitutional: Denies fevers, chills or abnormal night sweats Eyes: Denies blurriness of vision, double vision or watery eyes Ears, nose, mouth, throat, and face:  Denies mucositis or sore throat Respiratory: Denies cough, dyspnea or wheezes Cardiovascular: Denies palpitation, chest discomfort or lower extremity swelling Gastrointestinal:  Denies nausea, heartburn or change in bowel habits Skin: Denies abnormal skin rashes Lymphatics: Denies new lymphadenopathy or easy bruising Neurological:Denies numbness, tingling or new weaknesses Behavioral/Psych: Mood is stable, no new changes  All other systems were reviewed with the patient and are negative.  PHYSICAL EXAMINATION: ECOG PERFORMANCE STATUS: 1 - Symptomatic but completely ambulatory  Vitals:   01/29/16 0926  BP: 113/80  Pulse: 85  Resp: 18  Temp: 98.3 F (36.8 C)   Filed Weights   01/29/16 0926  Weight: 174 lb 3.2 oz (79 kg)    GENERAL:alert, no distress and comfortable SKIN: skin color, texture, turgor are normal, no rashes or significant lesions EYES: normal, conjunctiva are pink and non-injected, sclera clear OROPHARYNX:no exudate, no erythema and lips, buccal mucosa, and tongue normal  NECK: supple, thyroid normal size, non-tender, without nodularity LYMPH:  no palpable lymphadenopathy in the cervical, axillary or  inguinal LUNGS: clear to auscultation and percussion with normal breathing effort HEART: regular rate & rhythm and no murmurs and no lower extremity edema ABDOMEN:abdomen soft, non-tender and normal bowel sounds Musculoskeletal:no cyanosis of digits and no clubbing  PSYCH: alert & oriented x 3 with fluent speech NEURO: no focal motor/sensory deficits  LABORATORY DATA:  I have reviewed the data as listed CBC Latest Ref Rng & Units 01/29/2016 01/15/2016 01/08/2016  WBC 4.0 - 10.3 10e3/uL 2.1(L) 2.4(L) 2.0(L)  Hemoglobin 13.0 - 17.1 g/dL 11.2(L) 10.8(L) 11.1(L)  Hematocrit 38.4 - 49.9 % 35.9(L) 33.9(L) 34.6(L)  Platelets 140 - 400 10e3/uL 223 160 195   CMP Latest Ref Rng & Units 01/15/2016 01/08/2016 01/01/2016  Glucose 70 - 140 mg/dl 111 110 103  BUN 7.0 - 26.0 mg/dL 10.6 10.8 14.3  Creatinine 0.7 - 1.3 mg/dL 0.8 0.8 0.8  Sodium 136 - 145 mEq/L 140 140 139  Potassium 3.5 - 5.1 mEq/L 4.0 3.8 3.9  Chloride 101 - 111 mmol/L - - -  CO2 22 - 29 mEq/L 26 24 26   Calcium 8.4 - 10.4 mg/dL 9.0 8.9 9.4  Total Protein 6.4 - 8.3 g/dL 6.8 6.9 7.1  Total Bilirubin 0.20 - 1.20 mg/dL 0.30 0.36 0.37  Alkaline Phos 40 - 150 U/L 84 79 81  AST 5 - 34 U/L 13 14 11   ALT 0 - 55 U/L 7 10 10     PATHOLOGY REPORT  Final microscopic diagnosis Esophagus, 0.2 cm to 27 cm, biopsy -Invasive squamous cell carcinoma, moderately differentiated   RADIOGRAPHIC STUDIES: I have personally reviewed the radiological images as listed and agreed with the findings in the report. No results found. EGD 11/13/2015 Dr. Collene Mares  -A large, fungating, friable mass with bleeding was found in the mid third of the esophagus, 22 cm from the incisors and extended to 27 cm. The mass was nonobstructing and a circumferential, biopsy was taken. -Normal stomach -Normal proximal small bowel  Colonoscopy 11/13/2015 Dr. Collene Mares -Diverticulosis in the entire exam: -No specimens collected  EUS 12/05/2015 Malignant esophageal tumor was found in  the upper third of the esophagus and in the middle third of the esophagus. - A mass was found in the cervical esophagus and in the thoracic esophagus. The diagnosis is squamous cell carcinoma. This was staged T3 N1/N2 Mx by endosonographic criteria. - No specimens collected.   ASSESSMENT & PLAN: 76 year old gentleman, without significant past medical history except acid reflux, presented with progressive dysphagia and odynophagia  and weight loss.  1. Esophageal cancer, squamous cell carcinoma, cT3N1-2M0, stage IIIB -I reviewed his EGD findings, biopsy results, and the CT scan findings with patient and his wife in great details -I dicussed staging PET scan findings with patient and his wife, the mid esophageal mass is hypermetabolic, no distant metastasis. -EUS revealed a T3 lesion, N1 vs N2, locally advanced disease. -We recommend him to have neoadjuvant radiation and chemotherapy with weekly carboplatin and Taxol -Taxol was changed to Abraxane due to cirrhosis induced psychosis -He has completed neoadjuvant chemoradiation, and overall tolerated treatment moderately well, has gained some weight back, I encouraged him to continue nutrition supplement -I recommend a restaging CT chest, abdomen pelvis in 4 weeks, before his surgery, for restaging.  2. Dysphagia  -He is able to tolerate a soft diet and drinks fluids adequately -follow up with dietician Pamala Hurry  -He has started gaining some weight back  3. Odynophagia -Continue oxycodone as needed  4. Steroids induced psychosis -Resolved now. We'll avoid steroids in the future.  Recommendations -Lab results reviewed, anemia improved, mild leukocytopenia -Return to clinic in 4 weeks, with labs and a CT chest, abdomen and pelvis with contrast a few days before -He can see Dr. Pia Mau tomorrow  All questions were answered. The patient knows to call the clinic with any problems, questions or concerns.  I spent 15 minutes counseling the  patient face to face. The total time spent in the appointment was 20 minutes and more than 50% was on counseling.     [name], MD 01/29/2016

## 2016-01-29 NOTE — Telephone Encounter (Signed)
Appointments scheduled per 11/1 LOS. Patient given AVS report and calendars with future scheduled appointments.  °

## 2016-01-29 NOTE — Telephone Encounter (Signed)
Received records from [facility] for appointment on 02/11/16 with Dr Oval Linsey.  Records given to [employer] (medical records) for Dr Blenda Mounts schedule on 02/11/16. lp

## 2016-01-29 NOTE — ED Triage Notes (Signed)
Pt  Has  Ca    He  Is  Getting  Chemo     He  States  Last  Pm  He  Started  Having  Difficulty  Urinating   With   Scanty  Output    And  Dysuria    He  States  He   Also was   Seen  By  His pcp     Yesterday  But  He  Did not  Have  The  Symptoms  At that time

## 2016-01-29 NOTE — Discharge Instructions (Signed)
Take medicine as prescribed, see urologist if further problems

## 2016-01-30 ENCOUNTER — Other Ambulatory Visit: Payer: Self-pay | Admitting: Cardiothoracic Surgery

## 2016-01-30 ENCOUNTER — Ambulatory Visit (INDEPENDENT_AMBULATORY_CARE_PROVIDER_SITE_OTHER): Payer: Managed Care, Other (non HMO) | Admitting: Cardiothoracic Surgery

## 2016-01-30 ENCOUNTER — Encounter: Payer: Self-pay | Admitting: Cardiothoracic Surgery

## 2016-01-30 ENCOUNTER — Other Ambulatory Visit: Payer: Self-pay | Admitting: *Deleted

## 2016-01-30 VITALS — BP 117/83 | HR 96 | Resp 16 | Ht 69.0 in | Wt 174.0 lb

## 2016-01-30 DIAGNOSIS — C159 Malignant neoplasm of esophagus, unspecified: Secondary | ICD-10-CM | POA: Diagnosis not present

## 2016-01-30 DIAGNOSIS — Z923 Personal history of irradiation: Secondary | ICD-10-CM

## 2016-01-30 DIAGNOSIS — Z9221 Personal history of antineoplastic chemotherapy: Secondary | ICD-10-CM

## 2016-01-30 DIAGNOSIS — C158 Malignant neoplasm of overlapping sites of esophagus: Secondary | ICD-10-CM

## 2016-01-30 DIAGNOSIS — Z01818 Encounter for other preprocedural examination: Secondary | ICD-10-CM

## 2016-01-30 NOTE — Progress Notes (Signed)
[address]       [place] [address]             [phone]                    Tharon Waldrep [place] Medical Record [id number] Date of Birth: [birth year]  Referring: [name], MD Primary Care: [name]) [name], MD (Inactive)  Chief Complaint:    Chief Complaint  Patient presents with  . Follow-up    has completed chemoradiation...  Esophageal cancer ([place])   Staging form: Esophagus - Adenocarcinoma, AJCC 7th Edition   - Clinical stage from 11/13/2015: Stage IIIB (T3, N2, M0, G2) - Signed by [name], MD on 12/11/2015  History of Present Illness:    Harve Guynes 75 y.o. male is seen in the office  today for Esophageal cancer-squamous cell -but I do not have confirmation by pathology report number. The patient notes a "sore throat and painful swallowing for more than a year, associated with weight loss from 196-171. While living in [place] he had been told that he had Barrett's esophagus and had had episodic endoscopies, but I have no reports at this. Recently he saw Dr. Collene Mares for a follow-up colonoscopy, at that time a upper GI endoscopy was performed. A second endoscopy was performed with esophageal ultrasound as noted below :   A mass was found in the cervical esophagus and in the thoracic esophagus. The diagnosis is squamous cell carcinoma. This was staged T3 N1/N2 Mx by endosonographic criteria. From the descriptions of endoscopy is not clear to me if this patient has extensive squamous cell carcinoma involving the cervical and mid thoracic esophagus or 2 separate lesions.    He's currently undergoing radiation  course of radiation therapy on October 18  and chemotherapy  on October 17 .   He was seen in the emergency department on 12/19/2015 for insomnia and altered mental status. He was seen in urgent care yesterday for urinary retention, was given some medication and notes that he is passing urine better today.  At this point his  tolerating a soft diet relatively well, and maintaining his weight  CURRENT THERAPY: concurrent chemotherapy and radiation, with weekly carboplatin AUC 2 and Taxol 45 mg/m, started on 12/10/2015 and ended October 17   Current Activity/ Functional Status:  Patient is independent with mobility/ambulation, transfers, ADL's, IADL's.   Zubrod Score: At the time of surgery this patient's most appropriate activity status/level should be described as: []     0    Normal activity, no symptoms [x]     1    Restricted in physical strenuous activity but ambulatory, able to do out light work []     2    Ambulatory and capable of self care, unable to do work activities, up and about               >50 % of waking hours                              []     3    Only limited self care, in bed greater than 50% of waking hours []     4    Completely disabled, no self care, confined to bed or chair []     5    Moribund   Past Medical History:  Diagnosis Date  . Esophageal cancer ([place])   . Reflux  Past Surgical History:  Procedure Laterality Date  . EUS N/A 12/05/2015   Procedure: UPPER ENDOSCOPIC ULTRASOUND (EUS) LINEAR;  Surgeon: Carol Ada, MD;  Location: [facility];  Service: Endoscopy;  Laterality: N/A;  . HERNIA REPAIR    . IR GENERIC HISTORICAL  12/24/2015   IR FLUORO GUIDE PORT INSERTION RIGHT 12/24/2015 Arne Cleveland, MD [facility]  . IR GENERIC HISTORICAL  12/24/2015   IR US GUIDE VASC ACCESS RIGHT 12/24/2015 Arne Cleveland, MD [facility]  . SHOULDER ARTHROSCOPY W/ SUPERIOR LABRAL ANTERIOR POSTERIOR LESION REPAIR      Family History  Problem Relation Age of Onset  . Colon cancer Father 31  . Colon cancer Brother 71    Social History   Social History  . Marital status: Married    Spouse name: N/A  . Number of children: N/A  . Years of education: N/A   Occupational History  . Not on file.   Social History Main Topics  . Smoking status: Former Smoker    Packs/day: 2.00     Years: 40.00    Quit date: 03/31/1999  . Smokeless tobacco: Never Used  . Alcohol use No     Comment: weekend binge drinking for 30-40 years, quit in 2001  . Drug use: No  . Sexual activity: Not on file   Other Topics Concern  . 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 Former Smoker  . Packs/day: 2.00  . Years: 40.00  . Quit date: 03/31/1999  Smokeless Tobacco  . Never Used    History  Alcohol Use No    Comment: weekend binge drinking for 30-40 years, quit in 2001     No Known Allergies  Current Outpatient Prescriptions  Medication Sig Dispense Refill  . albuterol (PROVENTIL HFA;VENTOLIN HFA) 108 (90 BASE) MCG/ACT inhaler Inhale 2 puffs into the lungs every 4 (four) hours as needed for wheezing. 1 Inhaler 0  . amitriptyline (ELAVIL) 10 MG tablet Take 10 mg by mouth at bedtime. 11/16/15 - Take 1 tablet at Bedtime x 1 Week, then increase to 2 tablets Once Daily Orally- 30 days    . aspirin EC 81 MG tablet Take 81 mg by mouth daily.    Marland Kitchen atorvastatin (LIPITOR) 20 MG tablet Take 20 mg by mouth at bedtime.     . lidocaine-prilocaine (EMLA) cream Apply 1 application topically as needed. 30 g 1  . metFORMIN (GLUCOPHAGE) 500 MG tablet Take 500 mg by mouth 2 (two) times daily with a meal.     . omeprazole (PRILOSEC) 40 MG capsule Take 40 mg by mouth daily.     . ondansetron (ZOFRAN) 8 MG tablet Take 1 tablet (8 mg total) by mouth 2 (two) times daily as needed for refractory nausea / vomiting. Start on day 3 after chemo. 30 tablet 1  . oxyCODONE (ROXICODONE) 15 MG immediate release tablet Take 1 tablet (15 mg total) by mouth every 4 (four) hours as needed for pain. 60 tablet 0  . prochlorperazine (COMPAZINE) 10 MG tablet Take 1 tablet (10 mg total) by mouth every 6 (six) hours as needed (Nausea or vomiting). 30 tablet 1  . sucralfate (CARAFATE) 1 g tablet Take 1 tablet (1 g  total) by mouth 4 (four) times daily. 120 tablet 2  . tamsulosin (FLOMAX) 0.4 MG CAPS capsule Take 1 capsule (0.4 mg total)  by mouth daily after supper. 30 capsule 1  . Wound Dressings (SONAFINE) Apply 1 application topically daily.     No current facility-administered medications for this visit.       Review of Systems:     Cardiac Review of Systems: Y or N  Chest Pain [ y   ]  Resting SOB [  y ] Exertional SOB  Blue.Reese  ]  Orthopnea Blue.Reese  ]   Pedal Edema [  n ]    Palpitations Florencio.Farrier  ] Syncope  [n  ]   Presyncope [ y  ]  General Review of Systems: [Y] = yes [  ]=no Constitional: recent weight change Blue.Reese  ];  Wt loss over the last 3 months Blue.Reese   ] anorexia [  ]; fatigue [ y ]; nausea [ y ]; night sweats [  ]; fever [  ]; or chills [  ];          Dental: poor dentition[  ]; Last Dentist visit:   Eye : blurred vision [  ]; diplopia [   ]; vision changes [  ];  Amaurosis fugax[  ]; Resp: cough [  ];  wheezing[  ];  hemoptysis[ n ]; shortness of breath[  ]; paroxysmal nocturnal dyspnea[  ]; dyspnea on exertion[  ]; or orthopnea[  ];  GI:  gallstones[  ], vomiting[  ];  dysphagia[y  ]; melena[ n ];  hematochezia [n  ]; heartburn[  ];   Hx of  Colonoscopy[  ]; GU: kidney stones [  ]; hematuria[  ];   dysuria [  ];  nocturia[  ];  history of     obstruction [  ]; urinary frequency [  ]             Skin: rash, swelling[  ];, hair loss[  ];  peripheral edema[  ];  or itching[  ]; Musculosketetal: myalgias[  ];  joint swelling[  ];  joint erythema[  ];  joint pain[  ];  back pain[  ];  Heme/Lymph: bruising[  ];  bleeding[  ];  anemia[  ];  Neuro: TIA[  ];  headaches[  ];  stroke[  ];  vertigo[  ];  seizures[ n ];   paresthesias[  ];  difficulty walking[  ];  Psych:depression[  ]; anxiety[  ];  Endocrine: diabetes[  ];  thyroid dysfunction[  ];  Immunizations: Flu up to date [ y ]; Pneumococcal up to date [ n ];  Other:  Physical Exam: BP 117/83   Pulse 96   Resp 16   Ht 5\' 9"  (1.753 m)   Wt 174 lb  (78.9 kg)   SpO2 98% Comment: ON RA  BMI 25.70 kg/m   PHYSICAL EXAMINATION:  Patient has no cervical supraclavicular adenopathy right port is in place General appearance: alert and cooperative Head: Normocephalic, without obvious abnormality, atraumatic Neck: no adenopathy, no carotid bruit, no JVD, supple, symmetrical, trachea midline and thyroid not enlarged, symmetric, no tenderness/mass/nodules Lymph nodes: Cervical, supraclavicular, and axillary nodes normal. Resp: clear to auscultation bilaterally Back: symmetric, no curvature. ROM normal. No CVA tenderness. Cardio: regular rate and rhythm, S1, S2 normal, no murmur, click, rub or gallop GI: soft, non-tender; bowel sounds normal; no masses,  no organomegaly Extremities: extremities normal, atraumatic, no cyanosis or edema and Homans sign is negative, no sign of DVT Neurologic: Grossly normal  Diagnostic Studies & Laboratory data:     Recent Radiology Findings:   Dg  Chest 2 View  Result Date: 12/04/2015 CLINICAL DATA:  75 year old male with intermittent midsternal chest pain. Recent diagnosis of esophageal cancer. EXAM: CHEST  2 VIEW COMPARISON:  PETCT dated 12/03/2015 FINDINGS: The lungs are clear. There is no pleural effusion or pneumothorax. The cardiac silhouette is within normal limits. No acute osseous pathology. IMPRESSION: No active cardiopulmonary disease. Electronically Signed   By: Anner Crete M.D.   On: 12/04/2015 23:51   Ct Head Wo Contrast  Result Date: 12/19/2015 CLINICAL DATA:  Patient with history of psychiatric issues. History of esophageal carcinoma. EXAM: CT HEAD WITHOUT CONTRAST TECHNIQUE: Contiguous axial images were obtained from the base of the skull through the vertex without intravenous contrast. COMPARISON:  Brain CT 01/05/2014 FINDINGS: Brain: Ventricles and sulci are appropriate for patient's age. No evidence for acute cortically based infarct, intracranial hemorrhage, mass lesion or mass-effect.  Vascular: No hyperdense vessel or unexpected calcification. Skull: Normal. Negative for fracture or focal lesion. Sinuses/Orbits: No acute finding. Other: None. IMPRESSION: No acute intracranial process. Electronically Signed   By: Lovey Newcomer M.D.   On: 12/19/2015 15:51   Nm Pet Image Initial (pi) Skull Base To Thigh  Result Date: 12/03/2015 CLINICAL DATA:  Initial treatment strategy for esophageal cancer of the middle third of the esophagus. EXAM: NUCLEAR MEDICINE PET SKULL BASE TO THIGH TECHNIQUE: 12.7 mCi F-18 FDG was injected intravenously. Full-ring PET imaging was performed from the skull base to thigh after the radiotracer. CT data was obtained and used for attenuation correction and anatomic localization. FASTING BLOOD GLUCOSE:  Value: 11 mg/dl COMPARISON:  CT abdomen 11/13/2015 FINDINGS: NECK No hypermetabolic lymph nodes in the neck. CHEST A 3 cm segment of circumferential esophageal wall thickening at the level of the carina with intense metabolic activity (SUV max equal 18.2). Single wall thickness measures 1.5 cm High RIGHT paratracheal lymph node measures 7 mm (image 964, series 4) with low metabolic activity the hypermetabolic activity (SUV max equaled 2.1). No hypermetabolic paraesophageal lymph nodes. No suspicious pulmonary nodules. ABDOMEN/PELVIS No hypermetabolic gastrohepatic ligament nodes. No abnormal metabolic activity in the liver. No hypermetabolic retroperitoneal or pelvic lymph nodes. Physiologic activity noted within the colon. SKELETON No focal hypermetabolic activity to suggest skeletal metastasis. IMPRESSION: 1. Long segment of hypermetabolic thickening in the mid esophagus consistent with esophageal carcinoma. 2. No clear evidence of mediastinal nodal metastasis. Single high RIGHT paratracheal lymph node with low metabolic activity 3. No evidence of liver metastasis or upper abdominal nodal metastasis Electronically Signed   By: Suzy Bouchard M.D.   On: 12/03/2015 10:53   Ir  US Guide Vasc Access Right  Result Date: 12/24/2015 CLINICAL DATA:  Esophageal carcinoma, needs durable venous access for chemotherapy regimen EXAM: TUNNELED PORT CATHETER PLACEMENT WITH ULTRASOUND AND FLUOROSCOPIC GUIDANCE FLUOROSCOPY TIME:  0.2 minute, 37  uGym2 DAP ANESTHESIA/SEDATION: Intravenous Fentanyl and Versed were administered as conscious sedation during continuous monitoring of the patient's level of consciousness and physiological / cardiorespiratory status by the radiology RN, with a total moderate sedation time of 18 minutes. TECHNIQUE: The procedure, risks, benefits, and alternatives were explained to the patient. Questions regarding the procedure were encouraged and answered. The patient understands and consents to the procedure. As antibiotic prophylaxis, cefazolin 2 g was ordered pre-procedure and administered intravenously within one hour of incision. Patency of the right IJ vein was confirmed with ultrasound with image documentation. An appropriate skin site was determined. Skin site was marked. Region was prepped using maximum barrier technique including cap and mask, sterile gown,  sterile gloves, large sterile sheet, and Chlorhexidine as cutaneous antisepsis. The region was infiltrated locally with 1% lidocaine. Under real-time ultrasound guidance, the right IJ vein was accessed with a 21 gauge micropuncture needle; the needle tip within the vein was confirmed with ultrasound image documentation. Needle was exchanged over a 018 guidewire for transitional dilator which allowed passage of the [facility] wire into the IVC. Over this, the transitional dilator was exchanged for a 5 [place] MPA catheter. A small incision was made on the right anterior chest wall and a subcutaneous pocket fashioned. The power-injectable port was positioned and its catheter tunneled to the right IJ dermatotomy site. The MPA catheter was exchanged over an Amplatz wire for a peel-away sheath, through which the port  catheter, which had been trimmed to the appropriate length, was advanced and positioned under fluoroscopy with its tip at the cavoatrial junction. Spot chest radiograph confirms good catheter position and no pneumothorax. The pocket was closed with deep interrupted and subcuticular continuous 3-0 Monocryl sutures. The port was flushed per protocol. The incisions were covered with Dermabond then covered with a sterile dressing. COMPLICATIONS: COMPLICATIONS None immediate IMPRESSION: Technically successful right IJ power-injectable port catheter placement. Ready for routine use. Electronically Signed   By: Lucrezia Europe M.D.   On: 12/24/2015 16:26      I have independently reviewed the above radiologic studies.  Recent Lab Findings: Lab Results  Component Value Date   WBC 2.1 (L) 01/29/2016   HGB 11.2 (L) 01/29/2016   HCT 35.9 (L) 01/29/2016   PLT 223 01/29/2016   GLUCOSE 106 01/29/2016   ALT 12 01/29/2016   AST 14 01/29/2016   NA 139 01/29/2016   K 3.8 01/29/2016   CL 105 12/24/2015   CREATININE 0.8 01/29/2016   BUN 10.0 01/29/2016   CO2 25 01/29/2016   INR 1.02 12/24/2015   Findings: A large, ulcerating mass with no bleeding and stigmata of recent bleeding was found in the upper third of the esophagus and in the middle third of the esophagus. The mass was non-obstructing and partially circumferential (involving one-half of the lumen circumference). Endosonographic Finding A mass was found in the cervical esophagus and in the thoracic esophagus. The procedure was difficult to perform as a result of patient tolerance issues combined with rapid HR and severe HTN. In the upper to mid esophagus a large protruding mass was identified. The surrounding mucosa was friable and ulcerated. There was some initial difficulty with passing the radial echoendoscope through this area. Because of the patient's combativeness and the protrusion of the mass I was not able to obtain an accurate measurement  of the mass with endoscopic visualization. Evaluation with ultrasound was challenging, but a large eccentric hypoechoic mass with irregular borders was identified. In the proximal portion it appeared to be noncircumfirential, but in the mid portion and distally the lesion eccentrically circumfirential. At the largest portion of the mass, it measured 12.8 mm in depth. At this point, the mass protruded beyond the adventitia (T3). In the area it was not clear to me if the mass extended or if it was a collection of lymph nodes. A couple of peritumoral lymph nodes were identified just proximal to this area. Evaluation of the Celiac axis was negative for any evidence of lymph nodes. The left lobe of the liver was negative for any suspicious lesions and the left adrenal was normal in appearance. - Malignant esophageal tumor was found in the upper third of the esophagus and in the  middle third of the esophagus. - A mass was found in the cervical esophagus and in the thoracic esophagus. The diagnosis is squamous cell carcinoma. This was staged T3 N1/N2 Mx by endosonographic criteria. - No specimens collected  Assessment / Plan:   A He does have history of cardiac disease in notes of valve disorder which is unclassified and not fully evaluated,   I've requested the records from Dr. Youlanda Mighty office, and the original path reports. Before considering surgery he would have to be rescoped and repeat scans. I'm not optimistic that he would be a operative candidate but before making this final decision with see him back after he completes his restaging CT scan of the chest and abdomen November 29.   6 minute walk test was performed in the office today Because of a left bundle branch block patient had been referred to cardiology for evaluation primary care   Grace Isaac MD      [address] [place] [address] Office [phone]   Beeper [phone]  01/30/2016 1:32 PM   6  Minute Walk Test Results  Patient:            Paxton Carlone Date:                01/30/2016   Supplemental O2 during test?            no                                       Baseline                                 End  Time                            1:17                                         1:23      Heartrate                     104                                          130 Dyspnea                      no                                            no Fatigue                        no                                            no O2 sat  99%                                         99% Blood pressure            117/83                                 114/83   Patient ambulated at a slow steady pace for a total distance of784 feet with no stops.  Ambulation was limited primarily due to no issues  Overall the test was tolerated very well.

## 2016-01-30 NOTE — Progress Notes (Signed)
6 Minute Walk Test Results  Patient: Juan [name] Date:  01/30/2016   Supplemental O2 during test? no      Baseline   End  Time   1:17    1:23  Heartrate  104    130 Dyspnea  no    no Fatigue  no    no O2 sat   99%    99% Blood pressure 117/83           114/83   Patient ambulated at a slow steady pace for a total distance of784 feet with no stops.  Ambulation was limited primarily due to no issues  Overall the test was tolerated very well.

## 2016-02-06 ENCOUNTER — Telehealth: Payer: Self-pay | Admitting: *Deleted

## 2016-02-06 MED ORDER — OXYCODONE HCL 15 MG PO TABS: 15.0000 mg | ORAL_TABLET | ORAL | 0 refills | Status: DC | PRN

## 2016-02-06 NOTE — Telephone Encounter (Signed)
Patient walked in today for pain medicine per walk in form received.  Oxycodone 15 mg ordered last on 01-21-2016, take 1 every 4 hours as needed for pain, qty 60.

## 2016-02-09 NOTE — Progress Notes (Signed)
  Radiation Oncology         [phone] ________________________________  Name: Juan [name] MRN: [id number]  Date: 01/14/2016  DOB: [birth year]  End of Treatment Note  Diagnosis:   Esophageal cancer     Indication for treatment::  curative       Radiation treatment dates:   12/09/2015 through 01/14/2016  Site/dose:   The patient was treated to the gross tumor volume within the mid esophagus to a dose of 50 gray in 25 fractions. This was accomplished with concurrent chemotherapy using a IMRT technique with daily image guidance.   Narrative: The patient tolerated radiation treatment relatively well.   The patient experienced some esophagitis towards the end of treatment as was expected which was managed symptomatically.  Plan: The patient has completed radiation treatment. The patient will return to radiation oncology clinic for routine followup in one month. I advised the patient to call or return sooner if they have any questions or concerns related to their recovery or treatment. ________________________________  Jodelle Gross, M.D., Ph.D.

## 2016-02-11 ENCOUNTER — Ambulatory Visit (INDEPENDENT_AMBULATORY_CARE_PROVIDER_SITE_OTHER): Payer: Managed Care, Other (non HMO) | Admitting: Cardiovascular Disease

## 2016-02-11 ENCOUNTER — Encounter: Payer: Self-pay | Admitting: Cardiovascular Disease

## 2016-02-11 VITALS — BP 138/98 | HR 104 | Ht 69.0 in | Wt 171.8 lb

## 2016-02-11 DIAGNOSIS — E7800 Pure hypercholesterolemia, unspecified: Secondary | ICD-10-CM

## 2016-02-11 DIAGNOSIS — I447 Left bundle-branch block, unspecified: Secondary | ICD-10-CM | POA: Diagnosis not present

## 2016-02-11 DIAGNOSIS — Z01818 Encounter for other preprocedural examination: Secondary | ICD-10-CM

## 2016-02-11 DIAGNOSIS — O161 Unspecified maternal hypertension, first trimester: Secondary | ICD-10-CM

## 2016-02-11 HISTORY — DX: Left bundle-branch block, unspecified: I44.7

## 2016-02-11 NOTE — Progress Notes (Signed)
Cardiology Office Note   Date:  02/11/2016   ID:  Juan [name], DOB 1940-12-24, MRN [id number]  PCP:  Juan (Georgianne Fick) Sofie Hartigan, MD (Inactive)  Cardiologist:   Chilton Si, MD   Chief Complaint  Patient presents with  . New Patient (Initial Visit)  . Fatigue    in the mornings  . Dysmenorrhea    in right hands and right leg      History of Present Illness: Juan [name] is a 75 y.o. male with LBBB, Barrett's esophagus, hyperlipidemia who presents for preoperative risk assessment prior to surgery for esophageal cancer.  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  He presented with a 20-25.lb weight loss over 3-4 months.  He under went chemotherapy (carboplatin, Taxol-->Abraxane) with XRT.  He presents today for clearance prior to surgery.  [name]' main complaint is difficulty eating.  He can't eat many solid foods because they get stuck.  He sometimes has chest pain when eating or when he coughs but not with exertion.  He is able to walk up stairs or more than four blocks without getting chest pain or shortness of breath.  He denies lower extremity edema, orthopnea or PND.    Juan [name] checks his blood pressure at home and notes that it is typically well-controlled.  He thinks that it is elevated today because he was anxious about the appointment.  He quit smoking in 2001 after a 75 pack year history.  He also quit drinking alcohol in 2000 after drinking heavily in the past.    Past Medical History:  Diagnosis Date  . Esophageal cancer (HCC)   . LBBB (left bundle branch block) 02/11/2016  . Reflux     Past Surgical History:  Procedure Laterality Date  . EUS N/A 12/05/2015   Procedure: UPPER ENDOSCOPIC ULTRASOUND (EUS) LINEAR;  Surgeon: Juan Hawking, MD;  Location: [facility];  Service: Endoscopy;  Laterality: N/A;  . HERNIA REPAIR    . IR GENERIC HISTORICAL  12/24/2015   IR FLUORO GUIDE PORT INSERTION RIGHT  12/24/2015 Oley Balm, MD [facility]  . IR GENERIC HISTORICAL  12/24/2015   IR US GUIDE VASC ACCESS RIGHT 12/24/2015 Oley Balm, MD [facility]  . SHOULDER ARTHROSCOPY W/ SUPERIOR LABRAL ANTERIOR POSTERIOR LESION REPAIR       Current Outpatient Prescriptions  Medication Sig Dispense Refill  . amitriptyline (ELAVIL) 10 MG tablet Take 10 mg by mouth at bedtime. 11/16/15 - Take 1 tablet at Bedtime x 1 Week, then increase to 2 tablets Once Daily Orally- 30 days    . aspirin EC 81 MG tablet Take 81 mg by mouth daily.    Marland Kitchen atorvastatin (LIPITOR) 20 MG tablet Take 20 mg by mouth at bedtime.     . lidocaine-prilocaine (EMLA) cream Apply 1 application topically as needed. 30 g 1  . metFORMIN (GLUCOPHAGE) 500 MG tablet Take 500 mg by mouth 2 (two) times daily with a meal.     . omeprazole (PRILOSEC) 40 MG capsule Take 40 mg by mouth daily.     . ondansetron (ZOFRAN) 8 MG tablet Take 1 tablet (8 mg total) by mouth 2 (two) times daily as needed for refractory nausea / vomiting. Start on day 3 after chemo. 30 tablet 1  . oxyCODONE (ROXICODONE) 15 MG immediate release tablet Take 1 tablet (15 mg total) by mouth every 4 (four) hours as needed for pain. 60 tablet 0  . prochlorperazine (COMPAZINE) 10 MG  tablet Take 1 tablet (10 mg total) by mouth every 6 (six) hours as needed (Nausea or vomiting). 30 tablet 1  . tamsulosin (FLOMAX) 0.4 MG CAPS capsule Take 1 capsule (0.4 mg total) by mouth daily after supper. 30 capsule 1   No current facility-administered medications for this visit.     Allergies:   Patient has no known allergies.    Social History:  The patient  reports that he quit smoking about 16 years ago. He has a 80.00 pack-year smoking history. He has never used smokeless tobacco. He reports that he does not drink alcohol or use drugs.   Family History:  The patient's family history includes Colon cancer (age of onset: 43) in his brother; Colon cancer (age of onset: 12) in his father;  Diabetes in his sister; Heart attack in his brother and mother; Heart disease in his mother; Stroke in his sister.    ROS:  Please see the history of present illness.   Otherwise, review of systems are positive for none.   All other systems are reviewed and negative.    PHYSICAL EXAM: VS:  BP (!) 138/98   Pulse (!) 104   Ht 5\' 9"  (1.753 m)   Wt 77.9 kg (171 lb 12.8 oz)   BMI 25.37 kg/m  , BMI Body mass index is 25.37 kg/m. GENERAL:  Well appearing HEENT:  Pupils equal round and reactive, fundi not visualized, oral mucosa unremarkable NECK:  No jugular venous distention, waveform within normal limits, carotid upstroke brisk and symmetric, no bruits, no thyromegaly LYMPHATICS:  No cervical adenopathy LUNGS:  Clear to auscultation bilaterally HEART:  RRR.  PMI not displaced or sustained,S1 and S2 within normal limits, no S3, no S4, no clicks, no rubs, no murmurs ABD:  Flat, positive bowel sounds normal in frequency in pitch, no bruits, no rebound, no guarding, no midline pulsatile mass, no hepatomegaly, no splenomegaly EXT:  2 plus pulses throughout, no edema, no cyanosis no clubbing SKIN:  No rashes no nodules NEURO:  Cranial nerves II through XII grossly intact, motor grossly intact throughout PSYCH:  Cognitively intact, oriented to person place and time    EKG:  EKG is not ordered today. The ekg ordered 12/20/15 demonstrates sinus rhythm rate 60 bpm.  LBBB.   Recent Labs: 01/29/2016: ALT 12; BUN 10.0; Creatinine 0.8; HGB 11.2; Platelets 223; Potassium 3.8; Sodium 139    Lipid Panel No results found for: CHOL, TRIG, HDL, CHOLHDL, VLDL, LDLCALC, LDLDIRECT    Wt Readings from Last 3 Encounters:  02/11/16 77.9 kg (171 lb 12.8 oz)  01/30/16 78.9 kg (174 lb)  01/29/16 79 kg (174 lb 3.2 oz)      ASSESSMENT AND PLAN:  # Presurgical risk assessment:  Mr. Juan [name] does not have any unstable cardiac conditions.  He does have a known LBBB but no symptoms of ischemia.  Upon evaluation  today, he can achieve 4 METs or greater without anginal symptoms.  According to [facility] and AHA guidelines, he requires no further cardiac workup prior to his noncardiac surgery and should be at acceptable risk.  his NSQIP risk of peri-procedural MI or cardiac arrest is 1.34%.  Our service is available as necessary in the perioperative period.  Aspirin can be held for the procedure.  # LBBB:  This is chronic and he has no evidence of ischemia.  No further testing indicated at this time.  # Elevated BP: Repeat was 118/82.  We will not add any antihypertensives at this time.  Continue to monitor.    # Hyperlipidemia: Managed by his PCP.  Continue atorvastatin.    Current medicines are reviewed at length with the patient today.  The patient does not have concerns regarding medicines.  The following changes have been made:  no change  Labs/ tests ordered today include:  No orders of the defined types were placed in this encounter.    Disposition:   FU with Mishti Swanton C. Duke Salvia, MD, [facility] as needed    This note was written with the assistance of speech recognition software.  Please excuse any transcriptional errors.  Signed, Deklyn Trachtenberg C. Duke Salvia, MD, [facility]  02/11/2016 9:27 PM    [facility]

## 2016-02-11 NOTE — Patient Instructions (Addendum)
Medication Instructions:  OK TO HOLD ASPIRIN FOR UPCOMING PROCEDURE  Labwork: NONE  Testing/Procedures: NONE  Follow-Up: AS NEEDED   YOU ARE LOW RISK FOR UPCOMING SURGERY, WILL SEND A NOTE TO YOUR SURGEON

## 2016-02-13 ENCOUNTER — Other Ambulatory Visit: Payer: Self-pay | Admitting: Radiation Oncology

## 2016-02-13 ENCOUNTER — Telehealth: Payer: Self-pay | Admitting: *Deleted

## 2016-02-13 NOTE — Telephone Encounter (Signed)
Called patient home and cell number  No answer to ask if he needs carafate or not numbers called below  [phone] answer [phone], no answer ,[phone] no answer

## 2016-02-13 NOTE — Telephone Encounter (Signed)
Patient called  And is requesting carafate 90 day supply per [facility], still eating soft foods only, will send in basket to Shona Simpson PA,will call patient back with status  12:13 PM

## 2016-02-19 ENCOUNTER — Telehealth: Payer: Self-pay | Admitting: *Deleted

## 2016-02-19 NOTE — Telephone Encounter (Signed)
Please call pt back, no medication for numbness, it's possibly related to chemo. Encourage him to move around to increase his circulation in legs. Stay warm.   Thanks.   Juan [name]

## 2016-02-19 NOTE — Telephone Encounter (Signed)
"  I need advice.  My legs get numb.  Having numbness to my left leg that started a month ago.  I feel like the leg is giving out.  Feel weak.  Today I feel numbness to the right leg.  I stay in bed only getting up to eat meals.  No swelling, redness or warmth.  I'll take a pain pill to see if this helps my legs feel better.  Return number [phone]."

## 2016-02-19 NOTE — Telephone Encounter (Signed)
Spoke with pt and was informed that pt was feeling better after taking pain med.  Gave pt instructions from Dr. Burr Medico.  Stated he has been trying to ambulate more inside the house as tolerated.  Reinforced need to increase po fluids intake.  Pt voiced understanding.

## 2016-02-24 ENCOUNTER — Other Ambulatory Visit (HOSPITAL_BASED_OUTPATIENT_CLINIC_OR_DEPARTMENT_OTHER): Payer: Managed Care, Other (non HMO)

## 2016-02-24 ENCOUNTER — Ambulatory Visit (HOSPITAL_COMMUNITY)
Admission: RE | Admit: 2016-02-24 | Discharge: 2016-02-24 | Disposition: A | Discharge status: Home / Self Care | Payer: Managed Care, Other (non HMO) | Source: Ambulatory Visit | Attending: Hematology | Admitting: Hematology

## 2016-02-24 ENCOUNTER — Encounter (HOSPITAL_COMMUNITY): Payer: Self-pay

## 2016-02-24 ENCOUNTER — Ambulatory Visit (HOSPITAL_BASED_OUTPATIENT_CLINIC_OR_DEPARTMENT_OTHER): Payer: Managed Care, Other (non HMO)

## 2016-02-24 ENCOUNTER — Other Ambulatory Visit: Payer: Self-pay

## 2016-02-24 DIAGNOSIS — Z452 Encounter for adjustment and management of vascular access device: Secondary | ICD-10-CM | POA: Diagnosis not present

## 2016-02-24 DIAGNOSIS — Z95828 Presence of other vascular implants and grafts: Secondary | ICD-10-CM

## 2016-02-24 DIAGNOSIS — C154 Malignant neoplasm of middle third of esophagus: Secondary | ICD-10-CM | POA: Diagnosis not present

## 2016-02-24 DIAGNOSIS — I700 Atherosclerosis of aorta: Secondary | ICD-10-CM | POA: Insufficient documentation

## 2016-02-24 DIAGNOSIS — I2510 Atherosclerotic heart disease of native coronary artery without angina pectoris: Secondary | ICD-10-CM | POA: Insufficient documentation

## 2016-02-24 LAB — CBC WITH DIFFERENTIAL/PLATELET
BASO%: 0.4 % (ref 0.0–2.0)
Basophils Absolute: 0 10*3/uL (ref 0.0–0.1)
EOS%: 3.3 % (ref 0.0–7.0)
Eosinophils Absolute: 0.1 10*3/uL (ref 0.0–0.5)
HCT: 32.1 % — ABNORMAL LOW (ref 38.4–49.9)
HGB: 10.1 g/dL — ABNORMAL LOW (ref 13.0–17.1)
LYMPH%: 25.6 % (ref 14.0–49.0)
MCH: 23.3 pg — ABNORMAL LOW (ref 27.2–33.4)
MCHC: 31.5 g/dL — ABNORMAL LOW (ref 32.0–36.0)
MCV: 74 fL — ABNORMAL LOW (ref 79.3–98.0)
MONO#: 0.4 10*3/uL (ref 0.1–0.9)
MONO%: 14.9 % — ABNORMAL HIGH (ref 0.0–14.0)
NEUT#: 1.4 10*3/uL — ABNORMAL LOW (ref 1.5–6.5)
NEUT%: 55.8 % (ref 39.0–75.0)
Platelets: 195 10*3/uL (ref 140–400)
RBC: 4.34 10*6/uL (ref 4.20–5.82)
RDW: 17.7 % — ABNORMAL HIGH (ref 11.0–14.6)
WBC: 2.4 10*3/uL — ABNORMAL LOW (ref 4.0–10.3)
lymph#: 0.6 10*3/uL — ABNORMAL LOW (ref 0.9–3.3)

## 2016-02-24 LAB — COMPREHENSIVE METABOLIC PANEL
ALT: 17 U/L (ref 0–55)
AST: 17 U/L (ref 5–34)
Albumin: 3.6 g/dL (ref 3.5–5.0)
Alkaline Phosphatase: 73 U/L (ref 40–150)
Anion Gap: 10 mEq/L (ref 3–11)
BUN: 5.6 mg/dL — ABNORMAL LOW (ref 7.0–26.0)
CO2: 25 mEq/L (ref 22–29)
Calcium: 9.1 mg/dL (ref 8.4–10.4)
Chloride: 104 mEq/L (ref 98–109)
Creatinine: 0.8 mg/dL (ref 0.7–1.3)
EGFR: >90 mL/min/{1.73_m2} (ref >90)
Glucose: 98 mg/dl (ref 70–140)
Potassium: 3.8 mEq/L (ref 3.5–5.1)
Sodium: 139 mEq/L (ref 136–145)
Total Bilirubin: 0.45 mg/dL (ref 0.20–1.20)
Total Protein: 6.8 g/dL (ref 6.4–8.3)

## 2016-02-24 MED ORDER — SODIUM CHLORIDE 0.9 % IJ SOLN
10.0000 mL | INTRAMUSCULAR | Status: DC | PRN
  Administered 2016-02-24: 10 mL via INTRAVENOUS
  Filled 2016-02-24: qty 10

## 2016-02-24 MED ORDER — IOPAMIDOL (ISOVUE-300) INJECTION 61%
100.0000 mL | Freq: Once | INTRAVENOUS | Status: AC | PRN
  Administered 2016-02-24: 100 mL via INTRAVENOUS

## 2016-02-26 ENCOUNTER — Ambulatory Visit (HOSPITAL_BASED_OUTPATIENT_CLINIC_OR_DEPARTMENT_OTHER): Payer: Managed Care, Other (non HMO) | Admitting: Hematology

## 2016-02-26 ENCOUNTER — Encounter: Payer: Self-pay | Admitting: Hematology

## 2016-02-26 VITALS — BP 145/75 | HR 66 | Temp 97.4°F | Resp 17 | Ht 69.0 in | Wt 181.8 lb

## 2016-02-26 DIAGNOSIS — R1310 Dysphagia, unspecified: Secondary | ICD-10-CM

## 2016-02-26 DIAGNOSIS — C154 Malignant neoplasm of middle third of esophagus: Secondary | ICD-10-CM | POA: Diagnosis not present

## 2016-02-26 DIAGNOSIS — D6481 Anemia due to antineoplastic chemotherapy: Secondary | ICD-10-CM

## 2016-02-26 DIAGNOSIS — D701 Agranulocytosis secondary to cancer chemotherapy: Secondary | ICD-10-CM

## 2016-02-26 MED ORDER — OXYCODONE HCL 15 MG PO TABS: 15.0000 mg | ORAL_TABLET | Freq: Four times a day (QID) | ORAL | 0 refills | Status: DC | PRN

## 2016-02-26 NOTE — Progress Notes (Signed)
Juan [name]  [phone] [phone]  Clinic follow Up Note   Patient Care Team: Juan (Merrilee Seashore) [name], MD (Inactive) as PCP - General (Internal Medicine) [name], MD as Consulting Physician (Hematology) Kyung Rudd, MD as Consulting Physician (Radiation Oncology) Tania Ade, RN as Registered Nurse 02/26/2016   CHIEF COMPLAINTS:  Follow up esophageal squamous cell carcinoma  Oncology History   Presented with dysphagia and odynophagia with 20-25 pounds of weight loss in about 3-4 months  Esophageal cancer ([place])   Staging form: Esophagus - Adenocarcinoma, AJCC 7th Edition   - Clinical stage from 11/13/2015: Stage IIIB (T3, N2, M0, G2) - Signed by [name], MD on 12/11/2015      Esophageal cancer ([place])   11/13/2015 Procedure    UPPER ENDOSCOPY per Dr. Collene Mares: Large fungating, friable bleeding mass in middle third of esophagus, 22cm from incisors and extended to 27cm. Non-obstructing.      11/13/2015 Pathology Results    Invasive squamous cell carcinoma; moderately differentiated      11/13/2015 Imaging    CT ABD/PELVIS: IMPRESSION:No evidence of metastatic disease in the abdomen or pelvis. 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 Aortoiliac atherosclerosis. Small bilateral inguinal hernias containing fat the      11/22/2015 Initial Diagnosis    Esophageal cancer ([place])     12/03/2015 Imaging    PET scan showed a long segment of hypermetabolic thickening in the mid esophagus consistent with esophageal carcinoma. No clear evidence of node metastasis or distant metastasis.      12/10/2015 - 01/14/2016 Radiation Therapy    Neoadjuvant radiation to his esophageal cancer      12/10/2015 - 01/15/2016 Chemotherapy    Neoadjuvant weekly carboplatin AUC 2, and Taxol 45 mg/m, with concurrent radiation. He developed steroid-induced psychosis, and Taxol was changed to Abraxane to avoid premedication with steroids.      02/24/2016 Imaging   CT CAP with contrast showed interval improvement mid esophageal mass, no evidence for metastatic adenopathy or distant metastasis.        HISTORY OF PRESENTING ILLNESS:  Juan [name] 75 y.o. male is here because of His recently diagnosed esophageal cancer. He is accompanied by his wife to my clinic today.  He started having sore throat and dysphagia about 4-5 months ago, and was seen by PCP, had 2 course antibiotics but is symptom progressed. He was referred to gastroenterologist Dr. Suezanne Cheshire, and underwent EGD on 11/13/2015, which showed a large fungating, friable bleeding mass in the middle third of esophagus, 22 cm to 27 cm from incisors, non-obstructing. The biopsy showed squamous cell carcinoma. His CT abdomen and pelvis was negative for metastatic disease.  He has lost 20lbs, he is on soft diet and liquids, his pain is about 7/10 pain with swallowing. He also has chest pain , intermittent, it lasts about a few minutes, no nausea or vomiting, his bowel movement is normal. He denies melena or hematochezia. He is able to function and tolerated light activities at home.  CURRENT THERAPY: pending surgery   INTERIM HISTORY: Juan [name] returns for follow-up He has recovered well from chemoradiation, with better appetite and energy level. Eating well, no significant dysphagia. He still has mild intermittent chest pain, takes oxycodone 2-3 tablets a day He has seen his primary care physician and cardiologist as preop evaluation, will see Dr. Servando Snare next week  MEDICAL HISTORY:  Past Medical History:  Diagnosis Date  . Esophageal cancer ([place])   . LBBB (left bundle branch block)  02/11/2016  . Reflux     SURGICAL HISTORY: Past Surgical History:  Procedure Laterality Date  . EUS N/A 12/05/2015   Procedure: UPPER ENDOSCOPIC ULTRASOUND (EUS) LINEAR;  Surgeon: Carol Ada, MD;  Location: [facility];  Service: Endoscopy;  Laterality: N/A;  . HERNIA REPAIR    . IR GENERIC HISTORICAL  12/24/2015   IR  FLUORO GUIDE PORT INSERTION RIGHT 12/24/2015 Arne Cleveland, MD [facility]  . IR GENERIC HISTORICAL  12/24/2015   IR US GUIDE VASC ACCESS RIGHT 12/24/2015 Arne Cleveland, MD [facility]  . SHOULDER ARTHROSCOPY W/ SUPERIOR LABRAL ANTERIOR POSTERIOR LESION REPAIR      SOCIAL HISTORY: Social History   Social History  . Marital status: Married    Spouse name: N/A  . Number of children: N/A  . Years of education: N/A   Occupational History  . Not on file.   Social History Main Topics  . Smoking status: Former Smoker    Packs/day: 2.00    Years: 40.00    Quit date: 03/31/1999  . Smokeless tobacco: Never Used  . Alcohol use No     Comment: weekend binge drinking for 30-40 years, quit in 2001  . Drug use: No  . Sexual activity: Not on file   Other Topics Concern  . 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 Colon cancer Father 70  . Colon cancer Brother 37  . Heart attack Brother   . Heart disease Mother   . Heart attack Mother   . Diabetes Sister   . Stroke Sister     ALLERGIES:  has No Known Allergies.  MEDICATIONS:  Current Outpatient Prescriptions  Medication Sig Dispense Refill  . amitriptyline (ELAVIL) 10 MG tablet Take 10 mg by mouth at bedtime. 11/16/15 - Take 1 tablet at Bedtime x 1 Week, then increase to 2 tablets Once Daily Orally- 30 days    . aspirin EC 81 MG tablet Take 81 mg by mouth daily.    Marland Kitchen atorvastatin (LIPITOR) 20 MG tablet Take 20 mg by mouth at bedtime.     . lidocaine-prilocaine (EMLA) cream Apply 1 application topically as needed. 30 g 1  . omeprazole (PRILOSEC) 40 MG capsule Take 40 mg by mouth daily.     . ondansetron (ZOFRAN) 8 MG tablet Take 1 tablet (8 mg total) by mouth 2 (two) times daily as needed  for refractory nausea / vomiting. Start on day 3 after chemo. 30 tablet 1  . oxyCODONE (ROXICODONE) 15 MG immediate release tablet Take 1 tablet (15 mg total) by mouth every 6 (six) hours as needed for pain. 90 tablet 0  . prochlorperazine (COMPAZINE) 10 MG tablet Take 1 tablet (10 mg total) by mouth every 6 (six) hours as needed (Nausea or vomiting). 30 tablet 1  . sucralfate (CARAFATE) 1 g tablet TAKE 1 TABLET 4 TIMES A DAY  0  . tamsulosin (FLOMAX) 0.4 MG CAPS capsule Take 1 capsule (0.4 mg total) by mouth daily after supper. 30 capsule 1   No current facility-administered medications for this visit.     REVIEW OF SYSTEMS:   Constitutional: Denies fevers, chills or abnormal night sweats Eyes: Denies blurriness of vision, double vision or watery eyes  Ears, nose, mouth, throat, and face: Denies mucositis or sore throat Respiratory: Denies cough, dyspnea or wheezes Cardiovascular: Denies palpitation, chest discomfort or lower extremity swelling Gastrointestinal:  Denies nausea, heartburn or change in bowel habits Skin: Denies abnormal skin rashes Lymphatics: Denies new lymphadenopathy or easy bruising Neurological:Denies numbness, tingling or new weaknesses Behavioral/Psych: Mood is stable, no new changes  All other systems were reviewed with the patient and are negative.  PHYSICAL EXAMINATION: ECOG PERFORMANCE STATUS: 1 - Symptomatic but completely ambulatory  Vitals:   02/26/16 0932  BP: (!) 145/75  Pulse: 66  Resp: 17  Temp: 97.4 F (36.3 C)   Filed Weights   02/26/16 0932  Weight: 181 lb 12.8 oz (82.5 kg)    GENERAL:alert, no distress and comfortable SKIN: skin color, texture, turgor are normal, no rashes or significant lesions EYES: normal, conjunctiva are pink and non-injected, sclera clear OROPHARYNX:no exudate, no erythema and lips, buccal mucosa, and tongue normal  NECK: supple, thyroid normal size, non-tender, without nodularity LYMPH:  no palpable lymphadenopathy  in the cervical, axillary or inguinal LUNGS: clear to auscultation and percussion with normal breathing effort HEART: regular rate & rhythm and no murmurs and no lower extremity edema ABDOMEN:abdomen soft, non-tender and normal bowel sounds Musculoskeletal:no cyanosis of digits and no clubbing  PSYCH: alert & oriented x 3 with fluent speech NEURO: no focal motor/sensory deficits  LABORATORY DATA:  I have reviewed the data as listed CBC Latest Ref Rng & Units 02/24/2016 01/29/2016 01/15/2016  WBC 4.0 - 10.3 10e3/uL 2.4(L) 2.1(L) 2.4(L)  Hemoglobin 13.0 - 17.1 g/dL 10.1(L) 11.2(L) 10.8(L)  Hematocrit 38.4 - 49.9 % 32.1(L) 35.9(L) 33.9(L)  Platelets 140 - 400 10e3/uL 195 223 160   CMP Latest Ref Rng & Units 02/24/2016 01/29/2016 01/15/2016  Glucose 70 - 140 mg/dl 98 106 111  BUN 7.0 - 26.0 mg/dL 5.6(L) 10.0 10.6  Creatinine 0.7 - 1.3 mg/dL 0.8 0.8 0.8  Sodium 136 - 145 mEq/L 139 139 140  Potassium 3.5 - 5.1 mEq/L 3.8 3.8 4.0  Chloride 101 - 111 mmol/L - - -  CO2 22 - 29 mEq/L 25 25 26   Calcium 8.4 - 10.4 mg/dL 9.1 9.2 9.0  Total Protein 6.4 - 8.3 g/dL 6.8 7.0 6.8  Total Bilirubin 0.20 - 1.20 mg/dL 0.45 0.64 0.30  Alkaline Phos 40 - 150 U/L 73 76 84  AST 5 - 34 U/L 17 14 13   ALT 0 - 55 U/L 17 12 7     PATHOLOGY REPORT  Final microscopic diagnosis Esophagus, 0.2 cm to 27 cm, biopsy -Invasive squamous cell carcinoma, moderately differentiated   RADIOGRAPHIC STUDIES: I have personally reviewed the radiological images as listed and agreed with the findings in the report. Ct Chest W Contrast  Result Date: 02/24/2016 CLINICAL DATA:  Restaging esophageal carcinoma EXAM: CT CHEST, ABDOMEN, AND PELVIS WITH CONTRAST TECHNIQUE: Multidetector CT imaging of the chest, abdomen and pelvis was performed following the standard protocol during bolus administration of intravenous contrast. CONTRAST:  130mL ISOVUE-300 IOPAMIDOL (ISOVUE-300) INJECTION 61% COMPARISON:  PET-CT from 12/03/2015 FINDINGS:  CT CHEST FINDINGS Cardiovascular: The heart size is normal. Aortic atherosclerosis identified. LAD and left circumflex coronary artery calcifications identified. Mediastinum/Nodes: No mediastinal or hilar adenopathy identified. Circumferential wall thickening involving the mid thoracic esophagus is again noted. This appears improved from previous exam. At the level of the aortic arch the esophagus, at its thickest, measures 1.8 x 1.8 cm, image 16 of series 2. Previously 2.6 x 3.3 cm. Lungs/Pleura: No pleural effusion  identified. Mild changes of centrilobular emphysema no suspicious pulmonary nodules identified. Musculoskeletal: Degenerative disc disease is identified within the thoracic spine. No chest wall mass or suspicious bone lesions identified. CT ABDOMEN PELVIS FINDINGS Hepatobiliary: No focal liver abnormality is seen. No gallstones, gallbladder wall thickening, or biliary dilatation. Pancreas: Unremarkable. No pancreatic ductal dilatation or surrounding inflammatory changes. Spleen: Calcified granulomas identified within the spleen. Adrenals/Urinary Tract: The adrenal glands are normal. Parapelvic cyst identified within the left kidney measuring 1.4 cm, image 58 of series 2. Urinary bladder appears normal. Stomach/Bowel: The stomach is normal. The small bowel loops have a normal course and caliber. No obstruction. Unremarkable appearance of the colon. Vascular/Lymphatic: Aortic atherosclerosis. No enlarged upper abdominal lymph nodes. No pelvic or inguinal adenopathy. Reproductive: Prostate is unremarkable. Other: There is no ascites or focal fluid collections within the abdomen or pelvis. Musculoskeletal: No suspicious bone lesions identified. Spondylosis noted within the lumbar spine. IMPRESSION: 1. Interval improvement and mid esophageal mass. 2. No evidence for metastatic adenopathy or distant metastatic disease. 3. Aortic atherosclerosis and coronary artery calcification. Electronically Signed   By:  Kerby Moors M.D.   On: 02/24/2016 16:33   Ct Abdomen Pelvis W Contrast  Result Date: 02/24/2016 CLINICAL DATA:  Restaging esophageal carcinoma EXAM: CT CHEST, ABDOMEN, AND PELVIS WITH CONTRAST TECHNIQUE: Multidetector CT imaging of the chest, abdomen and pelvis was performed following the standard protocol during bolus administration of intravenous contrast. CONTRAST:  153mL ISOVUE-300 IOPAMIDOL (ISOVUE-300) INJECTION 61% COMPARISON:  PET-CT from 12/03/2015 FINDINGS: CT CHEST FINDINGS Cardiovascular: The heart size is normal. Aortic atherosclerosis identified. LAD and left circumflex coronary artery calcifications identified. Mediastinum/Nodes: No mediastinal or hilar adenopathy identified. Circumferential wall thickening involving the mid thoracic esophagus is again noted. This appears improved from previous exam. At the level of the aortic arch the esophagus, at its thickest, measures 1.8 x 1.8 cm, image 16 of series 2. Previously 2.6 x 3.3 cm. Lungs/Pleura: No pleural effusion identified. Mild changes of centrilobular emphysema no suspicious pulmonary nodules identified. Musculoskeletal: Degenerative disc disease is identified within the thoracic spine. No chest wall mass or suspicious bone lesions identified. CT ABDOMEN PELVIS FINDINGS Hepatobiliary: No focal liver abnormality is seen. No gallstones, gallbladder wall thickening, or biliary dilatation. Pancreas: Unremarkable. No pancreatic ductal dilatation or surrounding inflammatory changes. Spleen: Calcified granulomas identified within the spleen. Adrenals/Urinary Tract: The adrenal glands are normal. Parapelvic cyst identified within the left kidney measuring 1.4 cm, image 58 of series 2. Urinary bladder appears normal. Stomach/Bowel: The stomach is normal. The small bowel loops have a normal course and caliber. No obstruction. Unremarkable appearance of the colon. Vascular/Lymphatic: Aortic atherosclerosis. No enlarged upper abdominal lymph nodes.  No pelvic or inguinal adenopathy. Reproductive: Prostate is unremarkable. Other: There is no ascites or focal fluid collections within the abdomen or pelvis. Musculoskeletal: No suspicious bone lesions identified. Spondylosis noted within the lumbar spine. IMPRESSION: 1. Interval improvement and mid esophageal mass. 2. No evidence for metastatic adenopathy or distant metastatic disease. 3. Aortic atherosclerosis and coronary artery calcification. Electronically Signed   By: Kerby Moors M.D.   On: 02/24/2016 16:33   EGD 11/13/2015 Dr. Collene Mares  -A large, fungating, friable mass with bleeding was found in the mid third of the esophagus, 22 cm from the incisors and extended to 27 cm. The mass was nonobstructing and a circumferential, biopsy was taken. -Normal stomach -Normal proximal small bowel  Colonoscopy 11/13/2015 Dr. Collene Mares -Diverticulosis in the entire exam: -No specimens collected  EUS 12/05/2015 Malignant esophageal tumor  was found in the upper third of the esophagus and in the middle third of the esophagus. - A mass was found in the cervical esophagus and in the thoracic esophagus. The diagnosis is squamous cell carcinoma. This was staged T3 N1/N2 Mx by endosonographic criteria. - No specimens collected.   ASSESSMENT & PLAN: 75 year old gentleman, without significant past medical history except acid reflux, presented with progressive dysphagia and odynophagia and weight loss.  1. Esophageal cancer, squamous cell carcinoma, cT3N1-2M0, stage IIIB -I reviewed his EGD findings, biopsy results, and the CT scan findings with patient and his wife in great details -I dicussed staging PET scan findings with patient and his wife, the mid esophageal mass is hypermetabolic, no distant metastasis. -EUS revealed a T3 lesion, N1 vs N2, locally advanced disease. -We recommend him to have neoadjuvant radiation and chemotherapy with weekly carboplatin and Taxol -Taxol was changed to Abraxane due to cirrhosis  induced psychosis -He has completed neoadjuvant chemoradiation, and overall tolerated treatment moderately well, has gained some weight back, I encouraged him to continue nutrition supplement -I discussed his restaging CT scan, which showed improved esophageal cancer, no metastatic adenopathy or distant metastasis -He is going to see Dr. Servando Snare next week, and decide about esophagectomy. He is concerned his recovery and complications from surgery, has not final decide if he wants to proceed with surgery. I encouraged him to consider surgery, the chance of cure by chemotherapy and radiation alone without surgery is very small.  2. Dysphagia  -He is able to tolerate a soft diet and drinks fluids adequately -follow up with dietician Pamala Hurry  -He has started gaining some weight back  3. Odynophagia -Continue oxycodone as needed  4. Steroids induced psychosis -Resolved now. We'll avoid steroids in the future.  5. Leukopenia and anemia -Secondary to chemotherapy radiation -We'll continue monitoring, no need for blood transfusion  Recommendations -He will see Dr. Servando Snare next week, and it decides about surgery -I'll plan to see him 1 months after his surgery.  All questions were answered. The patient knows to call the clinic with any problems, questions or concerns.  I spent 20 minutes counseling the patient face to face. The total time spent in the appointment was 2r minutes and more than 50% was on counseling.     [name], MD 02/26/2016

## 2016-02-27 ENCOUNTER — Ambulatory Visit (INDEPENDENT_AMBULATORY_CARE_PROVIDER_SITE_OTHER): Payer: Managed Care, Other (non HMO) | Admitting: Cardiothoracic Surgery

## 2016-02-27 ENCOUNTER — Other Ambulatory Visit: Payer: Self-pay

## 2016-02-27 ENCOUNTER — Encounter: Payer: Self-pay | Admitting: Cardiothoracic Surgery

## 2016-02-27 VITALS — BP 122/83 | HR 73 | Resp 20 | Ht 69.0 in | Wt 179.8 lb

## 2016-02-27 DIAGNOSIS — C159 Malignant neoplasm of esophagus, unspecified: Secondary | ICD-10-CM | POA: Diagnosis not present

## 2016-02-27 DIAGNOSIS — Z923 Personal history of irradiation: Secondary | ICD-10-CM | POA: Diagnosis not present

## 2016-02-27 DIAGNOSIS — Z9221 Personal history of antineoplastic chemotherapy: Secondary | ICD-10-CM | POA: Diagnosis not present

## 2016-02-27 NOTE — Progress Notes (Signed)
[address]       [place] [address]             [phone]                    Juan [name] [place] Medical Record [id number] Date of Birth: 10/14/1940  Referring: Juan Merle, MD Primary Care: Juan [name]) Juan Heck, MD (Inactive)  Chief Complaint:    Chief Complaint  Patient presents with  . Esophageal Cancer    4 week f/u with CT Chest/ ABD  Esophageal cancer [name])   Staging form: Esophagus - Adenocarcinoma, AJCC 7th Edition   - Clinical stage from 11/13/2015: Stage IIIB (T3, N2, M0, G2) - Signed by Juan Merle, MD on 12/11/2015  History of Present Illness:    Juan [name] 75 y.o. male is seen in the office  today for Esophageal cancer-squamous cell -[place] [id number]. The patient notes a "sore throat and painful swallowing for more than a year, associated with weight loss from 196-171. While living in [place] he had been told that he had Barrett's esophagus and had had episodic endoscopies, but I have no reports at this.he saw Dr. Collene [name] for a follow-up colonoscopy, at that time a upper GI endoscopy was performed. A second endoscopy was performed with esophageal ultrasound as noted below :   A mass was found in the cervical esophagus and in the thoracic esophagus. The diagnosis is squamous cell carcinoma. This was staged T3 N1/N2 Mx by endosonographic criteria. From the descriptions of endoscopy is not clear to me if this patient has extensive squamous cell carcinoma involving the cervical and mid thoracic esophagus or 2 separate lesions.    He's currently undergoing radiation  course of radiation therapy on October 18  and chemotherapy  on October 17 .   He was seen in the emergency department on 12/19/2015 for insomnia and altered mental status. He was seen in urgent care yesterday for urinary retention, was given some medication and notes that he is passing urine better today.  At this point his tolerating a soft diet relatively well, and Gaining  weight   THERAPY: concurrent chemotherapy and radiation, with weekly carboplatin AUC 2 and Taxol 45 mg/m, started on 12/10/2015 and ended October 17   Current Activity/ Functional Status:  Patient is independent with mobility/ambulation, transfers, ADL's, IADL's.   Zubrod Score: At the time of surgery this patient's most appropriate activity status/level should be described as: []     0    Normal activity, no symptoms [x]     1    Restricted in physical strenuous activity but ambulatory, able to do out light work []     2    Ambulatory and capable of self care, unable to do work activities, up and about               >50 % of waking hours                              []     3    Only limited self care, in bed greater than 50% of waking hours []     4    Completely disabled, no self care, confined to bed or chair []     5    Moribund   Past Medical History:  Diagnosis Date  . Esophageal cancer ([place])   . LBBB (left bundle branch block) 02/11/2016  .  Reflux     Past Surgical History:  Procedure Laterality Date  . EUS N/A 12/05/2015   Procedure: UPPER ENDOSCOPIC ULTRASOUND (EUS) LINEAR;  Surgeon: Juan Ada, MD;  Location: [facility];  Service: Endoscopy;  Laterality: N/A;  . HERNIA REPAIR    . IR GENERIC HISTORICAL  12/24/2015   IR FLUORO GUIDE PORT INSERTION RIGHT 12/24/2015 Juan Cleveland, MD [facility]  . IR GENERIC HISTORICAL  12/24/2015   IR US GUIDE VASC ACCESS RIGHT 12/24/2015 Juan Cleveland, MD [facility]  . SHOULDER ARTHROSCOPY W/ SUPERIOR LABRAL ANTERIOR POSTERIOR LESION REPAIR      Family History  Problem Relation Age of Onset  . Colon cancer Father 76  . Colon cancer Brother 40  . Heart attack Brother   . Heart disease Mother   . Heart attack Mother   . Diabetes Sister   . Stroke Sister     Social History   Social History  . Marital status: Married    Spouse name: N/A  . Number of children: N/A  . Years of education: N/A   Occupational History  . Not  on file.   Social History Main Topics  . Smoking status: Former Smoker    Packs/day: 2.00    Years: 40.00    Quit date: 03/31/1999  . Smokeless tobacco: Never Used  . Alcohol use No     Comment: weekend binge drinking for 30-40 years, quit in 2001  . Drug use: No  . Sexual activity: Not on file   Other Topics Concern  . 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 Former Smoker  . Packs/day: 2.00  . Years: 40.00  . Quit date: 03/31/1999  Smokeless Tobacco  . Never Used    History  Alcohol Use No    Comment: weekend binge drinking for 30-40 years, quit in 2001     No Known Allergies  Current Outpatient Prescriptions  Medication Sig Dispense Refill  . amitriptyline (ELAVIL) 10 MG tablet Take 10 mg by mouth at bedtime. 11/16/15 - Take 1 tablet at Bedtime x 1 Week, then increase to 2 tablets Once Daily Orally- 30 days    . aspirin EC 81 MG tablet Take 81 mg by mouth daily.    Marland Kitchen atorvastatin (LIPITOR) 20 MG tablet Take 20 mg by mouth at bedtime.     . lidocaine-prilocaine (EMLA) cream Apply 1 application topically as needed. 30 g 1  . omeprazole (PRILOSEC) 40 MG capsule Take 40 mg by mouth daily.     . ondansetron (ZOFRAN) 8 MG tablet Take 1 tablet (8 mg total) by mouth 2 (two) times daily as needed for refractory nausea / vomiting. Start on day 3 after chemo. 30 tablet 1  . oxyCODONE (ROXICODONE) 15 MG immediate release tablet Take 1 tablet (15 mg total) by mouth every 6 (six) hours as needed for pain. 90 tablet 0  . prochlorperazine (COMPAZINE) 10 MG tablet Take 1 tablet (10 mg total) by mouth every 6 (six) hours as needed (Nausea or vomiting). 30 tablet 1  . sucralfate (CARAFATE) 1 g tablet TAKE 1 TABLET 4 TIMES A DAY  0  . tamsulosin (FLOMAX) 0.4 MG CAPS capsule Take 1 capsule (0.4 mg total) by mouth daily after supper. 30 capsule 1   No current  facility-administered medications for this visit.  Review of Systems:     Cardiac Review of Systems: Y or N  Chest Pain [ y   ]  Resting SOB [  y ] Exertional SOB  Blue.Reese  ]  Orthopnea Blue.Reese  ]   Pedal Edema [  n ]    Palpitations Florencio.Farrier  ] Syncope  [n  ]   Presyncope [ y  ]  General Review of Systems: [Y] = yes [  ]=no Constitional: recent weight change Blue.Reese  ];  Wt loss over the last 3 months Blue.Reese   ] anorexia [  ]; fatigue [ y ]; nausea [ y ]; night sweats [  ]; fever [  ]; or chills [  ];          Dental: poor dentition[  ]; Last Dentist visit:   Eye : blurred vision [  ]; diplopia [   ]; vision changes [  ];  Amaurosis fugax[  ]; Resp: cough [  ];  wheezing[  ];  hemoptysis[ n ]; shortness of breath[  ]; paroxysmal nocturnal dyspnea[  ]; dyspnea on exertion[  ]; or orthopnea[  ];  GI:  gallstones[  ], vomiting[  ];  dysphagia[y  ]; melena[ n ];  hematochezia [n  ]; heartburn[  ];   Hx of  Colonoscopy[  ]; GU: kidney stones [  ]; hematuria[  ];   dysuria [  ];  nocturia[  ];  history of     obstruction [  ]; urinary frequency [  ]             Skin: rash, swelling[  ];, hair loss[  ];  peripheral edema[  ];  or itching[  ]; Musculosketetal: myalgias[  ];  joint swelling[  ];  joint erythema[  ];  joint pain[  ];  back pain[  ];  Heme/Lymph: bruising[  ];  bleeding[  ];  anemia[  ];  Neuro: TIA[  ];  headaches[  ];  stroke[  ];  vertigo[  ];  seizures[ n ];   paresthesias[  ];  difficulty walking[  ];  Psych:depression[  ]; anxiety[  ];  Endocrine: diabetes[  ];  thyroid dysfunction[  ];  Immunizations: Flu up to date [ y ]; Pneumococcal up to date [ n ];  Other:  Physical Exam: BP 122/83   Pulse 73   Resp 20   Ht 5\' 9"  (1.753 m)   Wt 179 lb 12.8 oz (81.6 kg)   SpO2 95% Comment: RA  BMI 26.55 kg/m   PHYSICAL EXAMINATION:  Patient has no cervical supraclavicular adenopathy right port is in place General appearance: alert and cooperative Head: Normocephalic, without obvious abnormality,  atraumatic Neck: no adenopathy, no carotid bruit, no JVD, supple, symmetrical, trachea midline and thyroid not enlarged, symmetric, no tenderness/mass/nodules Lymph nodes: Cervical, supraclavicular, and axillary nodes normal. Resp: clear to auscultation bilaterally Back: symmetric, no curvature. ROM normal. No CVA tenderness. Cardio: regular rate and rhythm, S1, S2 normal, no murmur, click, rub or gallop GI: soft, non-tender; bowel sounds normal; no masses,  no organomegaly Extremities: extremities normal, atraumatic, no cyanosis or edema and Homans sign is negative, no sign of DVT Neurologic: Grossly normal  Diagnostic Studies & Laboratory data:     Recent Radiology Findings:  Ct Chest W Contrast & Ct Abdomen Pelvis W Contrast  Result Date: 02/24/2016 CLINICAL DATA:  Restaging esophageal carcinoma EXAM: CT CHEST, ABDOMEN, AND PELVIS WITH CONTRAST TECHNIQUE: Multidetector CT imaging of the chest, abdomen  and pelvis was performed following the standard protocol during bolus administration of intravenous contrast. CONTRAST:  179mL ISOVUE-300 IOPAMIDOL (ISOVUE-300) INJECTION 61% COMPARISON:  PET-CT from 12/03/2015 FINDINGS: CT CHEST FINDINGS Cardiovascular: The heart size is normal. Aortic atherosclerosis identified. LAD and left circumflex coronary artery calcifications identified. Mediastinum/Nodes: No mediastinal or hilar adenopathy identified. Circumferential wall thickening involving the mid thoracic esophagus is again noted. This appears improved from previous exam. At the level of the aortic arch the esophagus, at its thickest, measures 1.8 x 1.8 cm, image 16 of series 2. Previously 2.6 x 3.3 cm. Lungs/Pleura: No pleural effusion identified. Mild changes of centrilobular emphysema no suspicious pulmonary nodules identified. Musculoskeletal: Degenerative disc disease is identified within the thoracic spine. No chest wall mass or suspicious bone lesions identified. CT ABDOMEN PELVIS FINDINGS  Hepatobiliary: No focal liver abnormality is seen. No gallstones, gallbladder wall thickening, or biliary dilatation. Pancreas: Unremarkable. No pancreatic ductal dilatation or surrounding inflammatory changes. Spleen: Calcified granulomas identified within the spleen. Adrenals/Urinary Tract: The adrenal glands are normal. Parapelvic cyst identified within the left kidney measuring 1.4 cm, image 58 of series 2. Urinary bladder appears normal. Stomach/Bowel: The stomach is normal. The small bowel loops have a normal course and caliber. No obstruction. Unremarkable appearance of the colon. Vascular/Lymphatic: Aortic atherosclerosis. No enlarged upper abdominal lymph nodes. No pelvic or inguinal adenopathy. Reproductive: Prostate is unremarkable. Other: There is no ascites or focal fluid collections within the abdomen or pelvis. Musculoskeletal: No suspicious bone lesions identified. Spondylosis noted within the lumbar spine. IMPRESSION: 1. Interval improvement and mid esophageal mass. 2. No evidence for metastatic adenopathy or distant metastatic disease. 3. Aortic atherosclerosis and coronary artery calcification. Electronically Signed   By: Kerby Moors M.D.   On: 02/24/2016 16:33  Ct Head Wo Contrast  Result Date: 12/19/2015 CLINICAL DATA:  Patient with history of psychiatric issues. History of esophageal carcinoma. EXAM: CT HEAD WITHOUT CONTRAST TECHNIQUE: Contiguous axial images were obtained from the base of the skull through the vertex without intravenous contrast. COMPARISON:  Brain CT 01/05/2014 FINDINGS: Brain: Ventricles and sulci are appropriate for patient's age. No evidence for acute cortically based infarct, intracranial hemorrhage, mass lesion or mass-effect. Vascular: No hyperdense vessel or unexpected calcification. Skull: Normal. Negative for fracture or focal lesion. Sinuses/Orbits: No acute finding. Other: None. IMPRESSION: No acute intracranial process. Electronically Signed   By: Lovey Newcomer M.D.   On: 12/19/2015 15:51   Nm Pet Image Initial (pi) Skull Base To Thigh  Result Date: 12/03/2015 CLINICAL DATA:  Initial treatment strategy for esophageal cancer of the middle third of the esophagus. EXAM: NUCLEAR MEDICINE PET SKULL BASE TO THIGH TECHNIQUE: 12.7 mCi F-18 FDG was injected intravenously. Full-ring PET imaging was performed from the skull base to thigh after the radiotracer. CT data was obtained and used for attenuation correction and anatomic localization. FASTING BLOOD GLUCOSE:  Value: 11 mg/dl COMPARISON:  CT abdomen 11/13/2015 FINDINGS: NECK No hypermetabolic lymph nodes in the neck. CHEST A 3 cm segment of circumferential esophageal wall thickening at the level of the carina with intense metabolic activity (SUV max equal 18.2). Single wall thickness measures 1.5 cm High RIGHT paratracheal lymph node measures 7 mm (image 964, series 4) with low metabolic activity the hypermetabolic activity (SUV max equaled 2.1). No hypermetabolic paraesophageal lymph nodes. No suspicious pulmonary nodules. ABDOMEN/PELVIS No hypermetabolic gastrohepatic ligament nodes. No abnormal metabolic activity in the liver. No hypermetabolic retroperitoneal or pelvic lymph nodes. Physiologic activity noted within the colon. SKELETON No focal  hypermetabolic activity to suggest skeletal metastasis. IMPRESSION: 1. Long segment of hypermetabolic thickening in the mid esophagus consistent with esophageal carcinoma. 2. No clear evidence of mediastinal nodal metastasis. Single high RIGHT paratracheal lymph node with low metabolic activity 3. No evidence of liver metastasis or upper abdominal nodal metastasis Electronically Signed   By: Suzy Bouchard M.D.   On: 12/03/2015 10:53      I have independently reviewed the above radiologic studies.  Recent Lab Findings: Lab Results  Component Value Date   WBC 2.4 (L) 02/24/2016   HGB 10.1 (L) 02/24/2016   HCT 32.1 (L) 02/24/2016   PLT 195 02/24/2016   GLUCOSE 98  02/24/2016   ALT 17 02/24/2016   AST 17 02/24/2016   NA 139 02/24/2016   K 3.8 02/24/2016   CL 105 12/24/2015   CREATININE 0.8 02/24/2016   BUN 5.6 (L) 02/24/2016   CO2 25 02/24/2016   INR 1.02 12/24/2015   Findings: A large, ulcerating mass with no bleeding and stigmata of recent bleeding was found in the upper third of the esophagus and in the middle third of the esophagus. The mass was non-obstructing and partially circumferential (involving one-half of the lumen circumference). Endosonographic Finding A mass was found in the cervical esophagus and in the thoracic esophagus. The procedure was difficult to perform as a result of patient tolerance issues combined with rapid HR and severe HTN. In the upper to mid esophagus a large protruding mass was identified. The surrounding mucosa was friable and ulcerated. There was some initial difficulty with passing the radial echoendoscope through this area. Because of the patient's combativeness and the protrusion of the mass I was not able to obtain an accurate measurement of the mass with endoscopic visualization. Evaluation with ultrasound was challenging, but a large eccentric hypoechoic mass with irregular borders was identified. In the proximal portion it appeared to be noncircumfirential, but in the mid portion and distally the lesion eccentrically circumfirential. At the largest portion of the mass, it measured 12.8 mm in depth. At this point, the mass protruded beyond the adventitia (T3). In the area it was not clear to me if the mass extended or if it was a collection of lymph nodes. A couple of peritumoral lymph nodes were identified just proximal to this area. Evaluation of the Celiac axis was negative for any evidence of lymph nodes. The left lobe of the liver was negative for any suspicious lesions and the left adrenal was normal in appearance. - Malignant esophageal tumor was found in the upper third of the esophagus and in  the middle third of the esophagus. - A mass was found in the cervical esophagus and in the thoracic esophagus. The diagnosis is squamous cell carcinoma. This was staged T3 N1/N2 Mx by endosonographic criteria. - No specimens collected  Assessment / Plan:   Patient with advanced squamous cell carcinoma of the esophagus, he is undergone treatment with radiation and chemotherapy with improvement of symptoms and swallowing ability. Now being evaluated for possible esophageal resection. I discussed with the patient and his wife in detail the risks and options involved with esophagectomy his age and functional status made 6 cm much higher risk for surgical resection than the average patient. He is on sure what he wants to do,  I have referred him back to Dr. Benson Norway to consider repeat endoscopy biopsy and ultrasound as on the previous EUS 2 separate sites were described one in the cervical esophagus and one in the mid thoracic  esophagus this obviously would have significant effect on planning any operative procedure . After this is completed I will see the patient back .  Grace Isaac MD      [address] [place], [address] Office [phone]   Beeper [phone]  02/27/2016 12:33 PM   6 Minute Walk Test Results  Patient:            Juan [name] Date:                01/30/2016   Supplemental O2 during test?            no                                       Baseline                                 End  Time                            1:17                                         1:23      Heartrate                     104                                          130 Dyspnea                      no                                            no Fatigue                        no                                            no O2 sat                          99%                                         99% Blood pressure            117/83                                  114/83   Patient ambulated at a slow steady pace for a total distance of784 feet with no stops.  Ambulation was limited primarily due to no issues  Overall the test was tolerated very well.

## 2016-02-28 NOTE — Progress Notes (Deleted)
Juan [name] is here for a one month follow u  Weight changes, if any:   Bowel/Bladder complaints, if any: none  Nausea / Vomiting, if any: None   Pain issues, if any:  Level 8 pain  Any blood per rectum:  None

## 2016-03-02 ENCOUNTER — Encounter (HOSPITAL_COMMUNITY): Payer: Self-pay

## 2016-03-02 ENCOUNTER — Emergency Department (HOSPITAL_COMMUNITY): Payer: Managed Care, Other (non HMO)

## 2016-03-02 ENCOUNTER — Emergency Department (HOSPITAL_COMMUNITY)
Admission: EM | Admit: 2016-03-02 | Discharge: 2016-03-02 | Disposition: A | Discharge status: Home / Self Care | Payer: Managed Care, Other (non HMO) | Attending: Emergency Medicine | Admitting: Emergency Medicine

## 2016-03-02 ENCOUNTER — Other Ambulatory Visit: Payer: Self-pay

## 2016-03-02 DIAGNOSIS — R0602 Shortness of breath: Secondary | ICD-10-CM | POA: Diagnosis present

## 2016-03-02 DIAGNOSIS — Z87891 Personal history of nicotine dependence: Secondary | ICD-10-CM | POA: Diagnosis not present

## 2016-03-02 DIAGNOSIS — R0981 Nasal congestion: Secondary | ICD-10-CM

## 2016-03-02 LAB — CBC
HCT: 32.2 % — ABNORMAL LOW (ref 39.0–52.0)
Hemoglobin: 10.1 g/dL — ABNORMAL LOW (ref 13.0–17.0)
MCH: 23.3 pg — ABNORMAL LOW (ref 26.0–34.0)
MCHC: 31.4 g/dL (ref 30.0–36.0)
MCV: 74.2 fL — ABNORMAL LOW (ref 78.0–100.0)
Platelets: 237 10*3/uL (ref 150–400)
RBC: 4.34 MIL/uL (ref 4.22–5.81)
RDW: 17.5 % — ABNORMAL HIGH (ref 11.5–15.5)
WBC: 3 10*3/uL — ABNORMAL LOW (ref 4.0–10.5)

## 2016-03-02 LAB — BASIC METABOLIC PANEL
Anion gap: 7 (ref 5–15)
BUN: 7 mg/dL (ref 6–20)
CO2: 26 mmol/L (ref 22–32)
Calcium: 8.8 mg/dL — ABNORMAL LOW (ref 8.9–10.3)
Chloride: 104 mmol/L (ref 101–111)
Creatinine, Ser: 0.73 mg/dL (ref 0.61–1.24)
GFR calc Af Amer: >60 mL/min (ref >60)
GFR calc non Af Amer: >60 mL/min (ref >60)
Glucose, Bld: 114 mg/dL — ABNORMAL HIGH (ref 65–99)
Potassium: 3.8 mmol/L (ref 3.5–5.1)
Sodium: 137 mmol/L (ref 135–145)

## 2016-03-02 LAB — I-STAT TROPONIN, ED: Troponin i, poc: 0 ng/mL (ref 0.00–0.08)

## 2016-03-02 MED ORDER — FLUTICASONE PROPIONATE 50 MCG/ACT NA SUSP: 2.0000 | Freq: Every day | NASAL | 2 refills | Status: DC

## 2016-03-02 MED ORDER — SALINE SPRAY 0.65 % NA SOLN: 1.0000 | NASAL | 2 refills | Status: DC | PRN

## 2016-03-02 NOTE — ED Notes (Addendum)
Pt complaint of SOB, central chest pain "just when I cough," productive cough with white sputum, and nasal congestion since Friday. Pt reports completed chemo/radiation two weeks ago.

## 2016-03-02 NOTE — Discharge Instructions (Signed)
Thank you for visiting ED today. The symptoms are most probably due to nasal congestion. You can use Flonase and saline nasal spray to relieve your congestion. You can also try using some humidifier at home. Please see medical attention if your chest pain got worse or become really short of breath.

## 2016-03-02 NOTE — ED Triage Notes (Signed)
Pt presents with c/o chest pain and shortness of breath that started this morning. Pt reports he is a throat cancer pt, recently finished chemo and radiation. Pt reports that the pain in his chest is in the center of his chest. Pt is able to talk in complete sentences, no acute distress.

## 2016-03-02 NOTE — Progress Notes (Addendum)
Mr. Juan [name] is here for a one month follow up appointment S/P esophageal radiation for esophagus cancer to middle one third of the esophagus.  Weight changes, if any:  Wt Readings from Last 3 Encounters:  03/02/16 179 lb (81.2 kg)  02/27/16 179 lb 12.8 oz (81.6 kg)  02/26/16 181 lb 12.8 oz (82.5 kg)   Appetite:Good Swallowing problems/swallowing pain:Having some problems swallowing eating soft foods, taking Carafate qid Nausea / Vomiting, if any: Yes and is taking compazine as needed Pain issues, if any:  None now , took Oxycodone before coming in for his appointment this morning Respiratory issues: ER visit for SOB and a dry cough gave Flonase inhaler daily each nostril and Sodium chloride both nostril prn for congestion and CXR negative for disease ,states he is feeling less congestion today afebrile  BP 122/87   Pulse 71   Temp 97.8 F (36.6 C) (Oral)   Resp 18   Ht 5\' 9"  (1.753 m)   Wt 179 lb 12.8 oz (81.6 kg)   BMI 26.55 kg/m

## 2016-03-02 NOTE — ED Notes (Signed)
Provider at bedside

## 2016-03-02 NOTE — ED Provider Notes (Signed)
Emergency Department Provider Note   I have reviewed the triage vital signs and the nursing notes.   HISTORY  Chief Complaint Shortness of Breath and Chest Pain   HPI Juan [name] is a 75 y.o. male with past medical history significant for stage III esophageal cancer,s/p chemotherapy and radiation, waiting for his surgery, came to ED with complaint of inability to breathe through his nose because of nasal congestion for 3 days. He states that he was sleeping on 3 pillows last night because of nasal congestion. He denies any cough, fever and chills. Denies any sick contact.  He also complains of some central chest pain this morning, non-radiating, denies any nausea vomiting, palpitations as orthopnea or PND. He states that his chest pain resolved after taking oxycodone this morning. He also states that this chest pain was similar to his usual pain because of cancer. He denies any urinary complaints, denies any recent change in his bowel habits, appetite or weight.   Past Medical History:  Diagnosis Date  . Esophageal cancer ([place])   . LBBB (left bundle branch block) 02/11/2016  . Reflux     Patient Active Problem List   Diagnosis Date Noted  . LBBB (left bundle branch block) 02/11/2016  . Port catheter in place 12/25/2015  . 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 Esophageal cancer ([place]) 11/22/2015  . Noise effect on both inner ears 08/29/2015  . Right thyroid nodule 08/29/2015    Past Surgical History:  Procedure Laterality Date  . EUS N/A 12/05/2015   Procedure: UPPER ENDOSCOPIC ULTRASOUND (EUS) LINEAR;  Surgeon: Carol Ada, MD;  Location: [facility];  Service: Endoscopy;  Laterality: N/A;  . HERNIA REPAIR    . IR GENERIC HISTORICAL  12/24/2015   IR FLUORO GUIDE PORT INSERTION RIGHT 12/24/2015 Arne Cleveland, MD [facility]  . IR GENERIC HISTORICAL  12/24/2015   IR US GUIDE VASC ACCESS RIGHT 12/24/2015 Arne Cleveland, MD [facility]  . SHOULDER ARTHROSCOPY W/ SUPERIOR LABRAL ANTERIOR POSTERIOR LESION REPAIR      Current Outpatient Rx  . Order #: [id number] Class: Historical Med  . Order #: [id number] Class: Historical Med  . Order #: [id number] Class: Historical Med  . Order #: [id number] Class: Normal  . Order #: [id number] Class: Historical Med  . Order #: [id number] Class: Normal  . Order #: [id number] Class: Print  . Order #: [id number] Class: Normal  . Order #: [id number] Class: Historical Med  . Order #: [id number] Class: Print    Allergies Patient has no known allergies.  Family History  Problem Relation Age of Onset  . Colon cancer Father 51  . Colon cancer Brother 55  . Heart attack Brother   . Heart disease Mother   . Heart attack Mother   . Diabetes Sister   . Stroke Sister     Social History Social History  Substance Use Topics  . Smoking status: Former Smoker    Packs/day: 2.00    Years: 40.00    Quit date: 03/31/1999  . Smokeless tobacco: Never Used  . Alcohol use No     Comment: weekend binge drinking for 30-40 years, quit in 2001    Review of Systems Constitutional: No fever/chills Eyes: No visual changes. ENT: No sore throat. positive for nasal congestion. Cardiovascular:  chest pain. Respiratory: shortness of breath, because of nasal congestion and inability to breathe through nose. Gastrointestinal: No abdominal pain.  No nausea, no vomiting.  No diarrhea.  No constipation. Genitourinary: Negative for dysuria. Musculoskeletal: Negative  for back pain. Skin: Negative for rash. Neurological: Negative for headaches, focal weakness or numbness. 10-point ROS otherwise negative.  ____________________________________________   PHYSICAL EXAM:  VITAL SIGNS: ED Triage Vitals  Enc Vitals Group     BP 03/02/16 1102 120/89     Pulse Rate 03/02/16 1102 78     Resp 03/02/16 1102 18     Temp 03/02/16 1102 97.5 F (36.4 C)     Temp Source 03/02/16 1102 Oral     SpO2 03/02/16 1102 100 %      Weight 03/02/16 1100 179 lb (81.2 kg)     Height 03/02/16 1100 5\' 9"  (1.753 m)     Head Circumference --      Peak Flow --      Pain Score 03/02/16 1100 5     Pain Loc --      Pain Edu? --      Excl. in [place]? --    Constitutional: Alert and oriented. Well appearing and in no acute distress. Eyes: Conjunctivae are normal. PERRL. EOMI. Head: Atraumatic. Nose: Mild erythema and edema of nasal turbinates, congestion, mild clear rhinorrhea  Mouth/Throat: Mucous membranes are moist.  Oropharynx non-erythematous. Neck: No stridor.  No meningeal signs. no JVD Cardiovascular: Normal rate, regular rhythm. Good peripheral circulation. Grossly normal heart sounds.   Respiratory: Normal respiratory effort. decreased breath sounds at right lung base.  Gastrointestinal: Soft and nontender. No distention.  Musculoskeletal: No lower extremity tenderness nor edema. No gross deformities of extremities. Neurologic:  Normal speech and language. No gross focal neurologic deficits are appreciated.  Skin:  Skin is warm, dry and intact. No rash noted. Psychiatric: Anxious looking. Speech and behavior are normal.  ____________________________________________   LABS (all labs ordered are listed, but only abnormal results are displayed)  Labs Reviewed  BASIC METABOLIC PANEL  [place], ED   ____________________________________________  EKG   EKG Interpretation  Date/Time:  Monday March 02 2016 11:01:40 EST Ventricular Rate:  80 PR Interval:    QRS Duration: 154 QT Interval:  422 QTC Calculation: 487 R Axis:   -63 Text Interpretation:  Sinus rhythm Left bundle branch block No significant change since last tracing Confirmed by FLOYD MD, DANIEL [id number]) on 03/02/2016 11:40:45 AM      ____________________________________________  RADIOLOGY  No results found.  ____________________________________________   PROCEDURES  Procedure(s) performed:    Procedures   ____________________________________________   INITIAL IMPRESSION / ASSESSMENT AND PLAN / ED COURSE  Pertinent labs & imaging results that were available during my care of the patient were reviewed by me and considered in my medical decision making (see chart for details).  His complaints are more likely due to upper respiratory symptoms, because of some nasal congestion. Chest pain resolved with oxycodone. EKG was without any acute change.  Reevaluate him after labs and chest x-ray.  12.52PM. His labs and chest x-ray is normal, without any acute change. His symptoms are most likely due to upper respiratory and nasal congestion. He is stable to be discharged.   ____________________________________________  FINAL CLINICAL IMPRESSION(S) / ED DIAGNOSES  Final diagnoses:  None     MEDICATIONS GIVEN DURING THIS VISIT:  Medications - No data to display   NEW OUTPATIENT MEDICATIONS STARTED DURING THIS VISIT:  New Prescriptions   No medications on file      Note:  This document was prepared using Dragon voice recognition software and may include unintentional dictation errors.  Nanda Quinton, MD Emergency Medicine  Lorella Nimrod, MD 03/02/16 [place], DO 03/02/16 1547

## 2016-03-03 ENCOUNTER — Telehealth: Payer: Self-pay

## 2016-03-03 ENCOUNTER — Ambulatory Visit
Admission: RE | Admit: 2016-03-03 | Discharge: 2016-03-03 | Disposition: A | Discharge status: Home / Self Care | Payer: Managed Care, Other (non HMO) | Source: Ambulatory Visit | Attending: Radiation Oncology | Admitting: Radiation Oncology

## 2016-03-03 ENCOUNTER — Encounter: Payer: Self-pay | Admitting: Radiation Oncology

## 2016-03-03 ENCOUNTER — Other Ambulatory Visit: Payer: Self-pay | Admitting: Radiation Oncology

## 2016-03-03 VITALS — BP 122/87 | HR 71 | Temp 97.8°F | Resp 18 | Ht 69.0 in | Wt 179.8 lb

## 2016-03-03 DIAGNOSIS — Z923 Personal history of irradiation: Secondary | ICD-10-CM | POA: Insufficient documentation

## 2016-03-03 DIAGNOSIS — C154 Malignant neoplasm of middle third of esophagus: Secondary | ICD-10-CM

## 2016-03-03 DIAGNOSIS — Z9221 Personal history of antineoplastic chemotherapy: Secondary | ICD-10-CM | POA: Insufficient documentation

## 2016-03-03 DIAGNOSIS — F419 Anxiety disorder, unspecified: Secondary | ICD-10-CM | POA: Diagnosis not present

## 2016-03-03 DIAGNOSIS — C159 Malignant neoplasm of esophagus, unspecified: Secondary | ICD-10-CM | POA: Insufficient documentation

## 2016-03-03 DIAGNOSIS — Z7982 Long term (current) use of aspirin: Secondary | ICD-10-CM | POA: Diagnosis not present

## 2016-03-03 NOTE — Telephone Encounter (Signed)
Agree with the plan, thanks   Anayla Giannetti MD  

## 2016-03-03 NOTE — Telephone Encounter (Signed)
Pt called asking what OTC medication he can take for a cold. He describes nasal runny and a head cold, it is clear, denies chest involvement. Denies fever. Discussed his flonase on med list, and ocean spray. Discussed using night time liquid. Discussed if worsens, fever greater than 100.5, mucus turns colors to call us again.

## 2016-03-03 NOTE — Progress Notes (Signed)
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 This was accomplished with concurrent chemotherapy using a IMRT technique with daily image guidance.  Narrative:  The patient returns today for routine follow-up. The patient did well in tolerating his radiotherapy and has completed chemotherapy as well. He has met with Dr. Servando Snare and has plans to proceed with repeat endoscopy to evaluate the extent of disease. This will be performed tomorrow and will guide whether or not the patient is a candidate for esophagectomy.                   On review of systems, the patient states he is concerned about whether or not he will become a candidate for surgery and he and his wife report that this keeps him up at night, sometimes pacing the floors. He continues to tolerate liquids and soft foods and is trying to supplement with boost/ensure. He also continues carafate. He denies any abdominal pain, nausea, vomiting, chest pain, or shortness of breath. No other complaints are noted.  ALLERGIES:  has No Known Allergies.  Meds: Current Outpatient Prescriptions  Medication Sig Dispense Refill  . amitriptyline (ELAVIL) 10 MG tablet Take 10 mg by mouth at bedtime. 11/16/15 - Take 1 tablet at Bedtime x 1 Week, then increase to 2 tablets Once Daily Orally- 30 days    . aspirin EC 81 MG tablet Take 81 mg by mouth daily.    Marland Kitchen atorvastatin (LIPITOR) 20 MG tablet Take 20 mg by mouth at bedtime.     . fluticasone (FLONASE) 50 MCG/ACT nasal spray Place 2 sprays into  both nostrils daily. 16 g 2  . lidocaine-prilocaine (EMLA) cream Apply 1 application topically as needed. 30 g 1  . omeprazole (PRILOSEC) 40 MG capsule Take 40 mg by mouth daily.     Marland Kitchen oxyCODONE (ROXICODONE) 15 MG immediate release tablet Take 1 tablet (15 mg total) by mouth every 6 (six) hours as needed for pain. 90 tablet 0  . prochlorperazine (COMPAZINE) 10 MG tablet Take 1 tablet (10 mg total) by mouth every 6 (six) hours as needed (Nausea or vomiting). 30 tablet 1  . sodium chloride (OCEAN) 0.65 % SOLN nasal spray Place 1 spray into both nostrils as needed for congestion. 30 mL 2  . sucralfate (CARAFATE) 1 g tablet TAKE 1 TABLET 4 TIMES A DAY  0  . tamsulosin (FLOMAX) 0.4 MG CAPS capsule Take 1 capsule (0.4 mg total) by mouth daily after supper. 30 capsule 1  . ondansetron (ZOFRAN) 8 MG tablet Take 1 tablet (8 mg total) by mouth 2 (two) times daily as needed for refractory nausea / vomiting. Start on day 3 after chemo. (Patient not taking: Reported on 03/03/2016) 30 tablet 1   No current facility-administered medications for this encounter.     Physical Findings:  height is 5' 9" (1.753 m) and weight is 179 lb 12.8 oz (81.6 kg). His oral temperature is 97.8 F (36.6 C). His blood pressure is 122/87 and his pulse is  71. His respiration is 18.  In general this is a well appearing African American male in no acute distress. He's alert and oriented x4 and appropriate throughout the examination. Cardiopulmonary assessment is negative for acute distress and he exhibits normal effort.   Lab Findings: Lab Results  Component Value Date   WBC 3.0 (L) 03/02/2016   HGB 10.1 (L) 03/02/2016   HCT 32.2 (L) 03/02/2016   MCV 74.2 (L) 03/02/2016   PLT 237 03/02/2016     Radiographic Findings: Dg Chest 2 View  Result Date: 03/02/2016 CLINICAL DATA:  Worsening cough, congestion EXAM: CHEST  2 VIEW COMPARISON:  CT chest 02/24/2016 FINDINGS: There is a right-sided Port-A-Cath in satisfactory position.  There is no focal parenchymal opacity. There is no pleural effusion or pneumothorax. The heart and mediastinal contours are unremarkable. The osseous structures are unremarkable. IMPRESSION: No active cardiopulmonary disease. Electronically Signed   By: Kathreen Devoid   On: 03/02/2016 12:05   Ct Chest W Contrast  Result Date: 02/24/2016 CLINICAL DATA:  Restaging esophageal carcinoma EXAM: CT CHEST, ABDOMEN, AND PELVIS WITH CONTRAST TECHNIQUE: Multidetector CT imaging of the chest, abdomen and pelvis was performed following the standard protocol during bolus administration of intravenous contrast. CONTRAST:  150m ISOVUE-300 IOPAMIDOL (ISOVUE-300) INJECTION 61% COMPARISON:  PET-CT from 12/03/2015 FINDINGS: CT CHEST FINDINGS Cardiovascular: The heart size is normal. Aortic atherosclerosis identified. LAD and left circumflex coronary artery calcifications identified. Mediastinum/Nodes: No mediastinal or hilar adenopathy identified. Circumferential wall thickening involving the mid thoracic esophagus is again noted. This appears improved from previous exam. At the level of the aortic arch the esophagus, at its thickest, measures 1.8 x 1.8 cm, image 16 of series 2. Previously 2.6 x 3.3 cm. Lungs/Pleura: No pleural effusion identified. Mild changes of centrilobular emphysema no suspicious pulmonary nodules identified. Musculoskeletal: Degenerative disc disease is identified within the thoracic spine. No chest wall mass or suspicious bone lesions identified. CT ABDOMEN PELVIS FINDINGS Hepatobiliary: No focal liver abnormality is seen. No gallstones, gallbladder wall thickening, or biliary dilatation. Pancreas: Unremarkable. No pancreatic ductal dilatation or surrounding inflammatory changes. Spleen: Calcified granulomas identified within the spleen. Adrenals/Urinary Tract: The adrenal glands are normal. Parapelvic cyst identified within the left kidney measuring 1.4 cm, image 58 of series 2. Urinary bladder appears  normal. Stomach/Bowel: The stomach is normal. The small bowel loops have a normal course and caliber. No obstruction. Unremarkable appearance of the colon. Vascular/Lymphatic: Aortic atherosclerosis. No enlarged upper abdominal lymph nodes. No pelvic or inguinal adenopathy. Reproductive: Prostate is unremarkable. Other: There is no ascites or focal fluid collections within the abdomen or pelvis. Musculoskeletal: No suspicious bone lesions identified. Spondylosis noted within the lumbar spine. IMPRESSION: 1. Interval improvement and mid esophageal mass. 2. No evidence for metastatic adenopathy or distant metastatic disease. 3. Aortic atherosclerosis and coronary artery calcification. Electronically Signed   By: TKerby MoorsM.D.   On: 02/24/2016 16:33   Ct Abdomen Pelvis W Contrast  Result Date: 02/24/2016 CLINICAL DATA:  Restaging esophageal carcinoma EXAM: CT CHEST, ABDOMEN, AND PELVIS WITH CONTRAST TECHNIQUE: Multidetector CT imaging of the chest, abdomen and pelvis was performed following the standard protocol during bolus administration of intravenous contrast. CONTRAST:  1090mISOVUE-300 IOPAMIDOL (ISOVUE-300) INJECTION 61% COMPARISON:  PET-CT from 12/03/2015 FINDINGS: CT CHEST FINDINGS Cardiovascular: The heart size is normal. Aortic atherosclerosis identified. LAD and left circumflex coronary artery calcifications identified. Mediastinum/Nodes: No mediastinal or hilar adenopathy identified. Circumferential wall thickening involving the mid thoracic esophagus is again noted. This appears improved from  previous exam. At the level of the aortic arch the esophagus, at its thickest, measures 1.8 x 1.8 cm, image 16 of series 2. Previously 2.6 x 3.3 cm. Lungs/Pleura: No pleural effusion identified. Mild changes of centrilobular emphysema no suspicious pulmonary nodules identified. Musculoskeletal: Degenerative disc disease is identified within the thoracic spine. No chest wall mass or suspicious bone lesions  identified. CT ABDOMEN PELVIS FINDINGS Hepatobiliary: No focal liver abnormality is seen. No gallstones, gallbladder wall thickening, or biliary dilatation. Pancreas: Unremarkable. No pancreatic ductal dilatation or surrounding inflammatory changes. Spleen: Calcified granulomas identified within the spleen. Adrenals/Urinary Tract: The adrenal glands are normal. Parapelvic cyst identified within the left kidney measuring 1.4 cm, image 58 of series 2. Urinary bladder appears normal. Stomach/Bowel: The stomach is normal. The small bowel loops have a normal course and caliber. No obstruction. Unremarkable appearance of the colon. Vascular/Lymphatic: Aortic atherosclerosis. No enlarged upper abdominal lymph nodes. No pelvic or inguinal adenopathy. Reproductive: Prostate is unremarkable. Other: There is no ascites or focal fluid collections within the abdomen or pelvis. Musculoskeletal: No suspicious bone lesions identified. Spondylosis noted within the lumbar spine. IMPRESSION: 1. Interval improvement and mid esophageal mass. 2. No evidence for metastatic adenopathy or distant metastatic disease. 3. Aortic atherosclerosis and coronary artery calcification. Electronically Signed   By: Kerby Moors M.D.   On: 02/24/2016 16:33    Impression/Plan: 1. 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 The patient has been referred back to Dr. Benson Norway and tomorrow will proceed with repeat endoscopy and ultrasound as a portion of determining if he is a candidate for esophagectomy. He will follow up in medical oncology in the next few months. I have asked that he return in 6 month's time for continued evaluation. 2. Anxiety regarding medical diagnosis. I have offered a referral to our counselors here at the cancer center, and the patient is interested in proceeding. We will coordinate this at his convenience.     Carola Rhine, PAC

## 2016-03-04 ENCOUNTER — Telehealth: Payer: Self-pay | Admitting: *Deleted

## 2016-03-04 ENCOUNTER — Other Ambulatory Visit: Payer: Self-pay | Admitting: Gastroenterology

## 2016-03-04 NOTE — Telephone Encounter (Signed)
CALLED LAUREN MULLIS SOCIAL WORKER AND LEFT A MESSAGE TO GIVE THIS PATIENT A CALL

## 2016-03-04 NOTE — Telephone Encounter (Signed)
CALLED PATIENT TO INFORM OF FU APPT. WITH ALISON PERKINS ON 09-09-16 @ 2 PM, SPOKE WITH PATIENT'S WIFE DELAINA AND SHE IS AWARE OF THIS APPT.

## 2016-03-05 ENCOUNTER — Encounter: Payer: Self-pay | Admitting: *Deleted

## 2016-03-05 ENCOUNTER — Encounter (HOSPITAL_COMMUNITY): Payer: Self-pay | Admitting: *Deleted

## 2016-03-05 ENCOUNTER — Telehealth: Payer: Self-pay | Admitting: General Practice

## 2016-03-05 NOTE — Telephone Encounter (Signed)
No answer/no way to leave msg regarding January 2018 appts.

## 2016-03-05 NOTE — Progress Notes (Signed)
[place] Work  Clinical Social Work was referred by PA for assessment of psychosocial needs due to possible anxiety.  Clinical Social Worker contacted patient at home to offer support and assess for needs.  Pt is considering returning to work and is "tired of feeling bad and having procedures done".  He is concerned about possible surgery, but is really eager to start enjoying life again. Pt reports to have good support from his wife and extended family. He feels his anxiety and stress has been better and is manageable currently. He was not ready to set up appointment for counseling at this time. CSW reviewed resources to assist pt as he begins to move forward in survivorship. Pt was interested in the [place] program and will consider reaching out to the [facility] and sign up for their next class.  Pt agrees to reach out to CSW as needed for further assistance.    Clinical Social Work interventions: Supportive Psychiatric nurse education and referral  Loren Racer, [place] Worker [place]  [place] Phone: [phone] Fax: [phone]

## 2016-03-09 ENCOUNTER — Encounter (HOSPITAL_COMMUNITY): Payer: Self-pay | Admitting: Emergency Medicine

## 2016-03-09 ENCOUNTER — Emergency Department (HOSPITAL_COMMUNITY)
Admission: EM | Admit: 2016-03-09 | Discharge: 2016-03-09 | Disposition: A | Discharge status: Home / Self Care | Payer: Managed Care, Other (non HMO) | Attending: Emergency Medicine | Admitting: Emergency Medicine

## 2016-03-09 ENCOUNTER — Emergency Department (HOSPITAL_COMMUNITY): Payer: Managed Care, Other (non HMO)

## 2016-03-09 ENCOUNTER — Other Ambulatory Visit: Payer: Self-pay

## 2016-03-09 DIAGNOSIS — Z87891 Personal history of nicotine dependence: Secondary | ICD-10-CM | POA: Insufficient documentation

## 2016-03-09 DIAGNOSIS — K219 Gastro-esophageal reflux disease without esophagitis: Secondary | ICD-10-CM | POA: Diagnosis not present

## 2016-03-09 DIAGNOSIS — R079 Chest pain, unspecified: Secondary | ICD-10-CM | POA: Diagnosis present

## 2016-03-09 DIAGNOSIS — R0789 Other chest pain: Secondary | ICD-10-CM

## 2016-03-09 DIAGNOSIS — G893 Neoplasm related pain (acute) (chronic): Secondary | ICD-10-CM | POA: Diagnosis not present

## 2016-03-09 DIAGNOSIS — Z8501 Personal history of malignant neoplasm of esophagus: Secondary | ICD-10-CM | POA: Insufficient documentation

## 2016-03-09 LAB — BASIC METABOLIC PANEL
Anion gap: 7 (ref 5–15)
BUN: 6 mg/dL (ref 6–20)
CO2: 27 mmol/L (ref 22–32)
Calcium: 9 mg/dL (ref 8.9–10.3)
Chloride: 105 mmol/L (ref 101–111)
Creatinine, Ser: 0.99 mg/dL (ref 0.61–1.24)
GFR calc Af Amer: >60 mL/min (ref >60)
GFR calc non Af Amer: >60 mL/min (ref >60)
Glucose, Bld: 121 mg/dL — ABNORMAL HIGH (ref 65–99)
Potassium: 3.8 mmol/L (ref 3.5–5.1)
Sodium: 139 mmol/L (ref 135–145)

## 2016-03-09 LAB — CBC
HCT: 34.8 % — ABNORMAL LOW (ref 39.0–52.0)
Hemoglobin: 10.9 g/dL — ABNORMAL LOW (ref 13.0–17.0)
MCH: 23.5 pg — ABNORMAL LOW (ref 26.0–34.0)
MCHC: 31.3 g/dL (ref 30.0–36.0)
MCV: 75.2 fL — ABNORMAL LOW (ref 78.0–100.0)
Platelets: 278 10*3/uL (ref 150–400)
RBC: 4.63 MIL/uL (ref 4.22–5.81)
RDW: 17.1 % — ABNORMAL HIGH (ref 11.5–15.5)
WBC: 3 10*3/uL — ABNORMAL LOW (ref 4.0–10.5)

## 2016-03-09 LAB — I-STAT TROPONIN, ED: Troponin i, poc: 0 ng/mL (ref 0.00–0.08)

## 2016-03-09 MED ORDER — PANTOPRAZOLE SODIUM 40 MG PO TBEC: 40.0000 mg | DELAYED_RELEASE_TABLET | Freq: Every day | ORAL | 0 refills | Status: DC

## 2016-03-09 NOTE — ED Notes (Signed)
Bed: WA05 Expected date:  Expected time:  Means of arrival:  Comments: Triage  

## 2016-03-09 NOTE — ED Notes (Signed)
ED Provider at bedside. 

## 2016-03-09 NOTE — ED Notes (Signed)
NT attempting to place patient on monitor. Resident at bedside asked to hold off at the moment.

## 2016-03-09 NOTE — Discharge Instructions (Signed)
Thank you for visiting ED today. Your pain is most probably because of your radiation done recently. Your reflex is playing a role. I'm giving you another medicine for your reflex, take it as directed. You can take your oxycodone 3-4 times in a day if needed for pain.

## 2016-03-09 NOTE — ED Triage Notes (Addendum)
Patient reports intermittent sharp left sided chest pain since this morning. Denies radiation, N/V/D, and SOB. Patient also reports left hip and left knee pain while walking. Denies injury. Patient states he was seen x1 week ago for same.

## 2016-03-09 NOTE — ED Provider Notes (Signed)
Emergency Department Provider Note   I have reviewed the triage vital signs and the nursing notes.   HISTORY  Chief Complaint Chest Pain and Leg Pain   HPI Juan [name] is a 75 y.o. male with past medical history significant for stage III esophageal cancer,s/p chemotherapy and radiation, waiting for his surgery, came to ED with complaint of left-sided chest pain started this morning around 6 AM after eating breakfast. Pain was nonradiating, not associated with nausea, vomiting, diaphoresis, palpitation or shortness of breath. He states that pain started as in burning pain and then became sharp. No aggravating factors. He took his oxycodone just before coming to the hospital, pain was much relieved by the time examination was done.  He also complained of left hip pain radiating to back off his leg, this is a chronic issue and he states that pain was bothering him more than usual for the last 2-3 days. He denies any tingling, numbness or focal weakness.  Patient states that he is little anxious about his appointment with surgery tomorrow.    Past Medical History:  Diagnosis Date  . Esophageal cancer ([place])   . LBBB (left bundle branch block) 02/11/2016  . Reflux     Patient Active Problem List   Diagnosis Date Noted  . LBBB (left bundle branch block) 02/11/2016  . Port catheter in place 12/25/2015  . 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 Esophageal cancer ([place]) 11/22/2015  . Noise effect on both inner ears 08/29/2015  . Right thyroid nodule 08/29/2015    Past Surgical History:  Procedure Laterality Date  . EUS N/A 12/05/2015   Procedure: UPPER ENDOSCOPIC ULTRASOUND (EUS) LINEAR;  Surgeon: Carol Ada, MD;  Location: [facility];  Service: Endoscopy;  Laterality: N/A;  . HERNIA REPAIR    . IR GENERIC HISTORICAL  12/24/2015   IR FLUORO GUIDE PORT INSERTION RIGHT 12/24/2015 Arne Cleveland, MD [facility]  . IR GENERIC HISTORICAL   12/24/2015   IR US GUIDE VASC ACCESS RIGHT 12/24/2015 Arne Cleveland, MD [facility]  . SHOULDER ARTHROSCOPY W/ SUPERIOR LABRAL ANTERIOR POSTERIOR LESION REPAIR      Current Outpatient Rx  . Order #: [id number] Class: Historical Med  . Order #: [id number] Class: Historical Med  . Order #: [id number] Class: Historical Med  . Order #: [id number] Class: Normal  . Order #: [id number] Class: Print  . Order #: [id number] Class: Normal  . Order #: [id number] Class: Historical Med  . Order #: [id number] Class: Historical Med  . Order #: [id number] Class: Print  . Order #: [id number] Class: Historical Med  . Order #: [id number] Class: Print  . Order #: [id number] Class: Normal  . Order #: [id number] Class: Print  . Order #: [id number] Class: Print    Allergies Patient has no known allergies.  Family History  Problem Relation Age of Onset  . Colon cancer Father 2  . Colon cancer Brother 55  . Heart attack Brother   . Heart disease Mother   . Heart attack Mother   . Diabetes Sister   . Stroke Sister     Social History Social History  Substance Use Topics  . Smoking status: Former Smoker    Packs/day: 2.00    Years: 40.00    Quit date: 03/31/1999  . Smokeless tobacco: Never Used  . Alcohol use No     Comment: weekend binge drinking for 30-40 years, quit in 2001    Review of Systems Constitutional: No fever/chills Eyes: No visual changes. ENT: No sore throat. Cardiovascular:  chest  pain. Respiratory: Denies shortness of breath. Gastrointestinal: No abdominal pain.  No nausea, no vomiting.  No diarrhea.  No constipation. Genitourinary: Negative for dysuria. Musculoskeletal: Negative for back pain.Positive for left hip pain radiating to back off his left leg. Skin: Negative for rash. Neurological: Negative for headaches, focal weakness or numbness. 10-point ROS otherwise negative.  ____________________________________________   PHYSICAL EXAM:  VITAL SIGNS: ED Triage Vitals  Enc Vitals Group      BP 03/09/16 1155 136/82     Pulse Rate 03/09/16 1155 60     Resp --      Temp 03/09/16 1155 98.3 F (36.8 C)     Temp Source 03/09/16 1155 Oral     SpO2 03/09/16 1155 97 %     Weight 03/09/16 1156 180 lb (81.6 kg)     Height 03/09/16 1156 5\' 9"  (1.753 m)     Head Circumference --      Peak Flow --      Pain Score 03/09/16 1156 6     Pain Loc --      Pain Edu? --      Excl. in [place]? --    Constitutional: Alert and oriented.Anxious looking and in no acute distress. Eyes: Conjunctivae are normal. PERRL. EOMI. Head: Atraumatic. Mouth/Throat: Mucous membranes are moist.  Oropharynx non-erythematous. Neck: No stridor.  No meningeal signs.  Cardiovascular: Normal rate, regular rhythm. Good peripheral circulation. Grossly normal heart sounds.   Respiratory: Normal respiratory effort.  No retractions. Lungs CTAB. Gastrointestinal: Soft and nontender. No distention.  Musculoskeletal: Mild tenderness along the left lower tibia, No  edema. No gross deformities of extremities.Sensations and strength normal bilaterally. Neurologic:  Normal speech and language. No gross focal neurologic deficits are appreciated.  Skin:  Skin is warm, dry and intact. No rash noted.  ____________________________________________   LABS (all labs ordered are listed, but only abnormal results are displayed)  Labs Reviewed  BASIC METABOLIC PANEL - Abnormal; Notable for the following:       Result Value   Glucose, Bld 121 (*)    All other components within normal limits  CBC - Abnormal; Notable for the following:    WBC 3.0 (*)    Hemoglobin 10.9 (*)    HCT 34.8 (*)    MCV 75.2 (*)    MCH 23.5 (*)    RDW 17.1 (*)    All other components within normal limits  I-STAT TROPOININ, ED   ____________________________________________  EKG   EKG Interpretation  Date/Time:  Monday March 09 2016 11:58:25 EST Ventricular Rate:  60 PR Interval:    QRS Duration: 154 QT Interval:  449 QTC Calculation: 449 R  Axis:   4 Text Interpretation:  Sinus rhythm Left bundle branch block since last tracing no significant change Confirmed by Eulis Foster  MD, ELLIOTT [phone]) on 03/09/2016 1:27:49 PM      ____________________________________________  RADIOLOGY  Dg Chest 2 View  Result Date: 03/09/2016 CLINICAL DATA:  Chest pain . EXAM: CHEST  2 VIEW COMPARISON:  03/02/2016.  03/23/2012.  11/25/2010. FINDINGS: PowerPort catheter with tip projected over superior vena cava. Heart size normal. Pulmonary nodule noted over the left lung base. Repeat chest x-ray with nipple markers suggested. If this does not represent nipple shadow and persist on follow-up chest x-rays nonenhanced chest CT is suggested . Stable heart size. Pulmonary venous structures are normal. No pleural effusion or pneumothorax . IMPRESSION: 1. PowerPort catheter noted with tip projected superior vena cava. 2. Questionable nodule left lung  base, possibly nipple shadow. Repeat PA and lateral chest x-ray with nipple markers suggested. Electronically Signed   By: Marcello Moores  Register   On: 03/09/2016 12:41    ____________________________________________   PROCEDURES  Procedure(s) performed:   Procedures   ____________________________________________   INITIAL IMPRESSION / ASSESSMENT AND PLAN / ED COURSE  Pertinent labs & imaging results that were available during my care of the patient were reviewed by me and considered in my medical decision making (see chart for details).  Pedro Hocker is a 75 y.o. male with past medical history significant for stage III esophageal cancer,s/p chemotherapy and radiation, waiting for his surgery, came to ED with complaint of left-sided chest pain started this morning around 6 AM after eating breakfast. Pain was nonradiating, not associated with nausea, vomiting, diaphoresis, palpitation or shortness of breath. He states that pain started as in burning pain and then became sharp. No aggravating factors. He took his  oxycodone just before coming to the hospital, pain was much relieved by the time examination was done.  As he was recently seen at [facility] ED with the same type of chest pain relieved after oxycodone and shortness of breath because of nasal congestion.  His most probably having pain because of recent radiation for his esophageal cancer and GERD symptoms can be contributory. He is being discharged home with the addition of Protonix.   ____________________________________________  FINAL CLINICAL IMPRESSION(S) / ED DIAGNOSES  GERD and post radiation pain.   MEDICATIONS GIVEN DURING THIS VISIT:  Medications - No data to display   NEW OUTPATIENT MEDICATIONS STARTED DURING THIS VISIT:  New Prescriptions   PANTOPRAZOLE (PROTONIX) 40 MG TABLET    Take 1 tablet (40 mg total) by mouth daily.      Note:  This document was prepared using Dragon voice recognition software and may include unintentional dictation errors.     Lorella Nimrod, MD 03/09/16 1518    Lacretia Leigh, MD 03/09/16 [phone]

## 2016-03-10 ENCOUNTER — Ambulatory Visit (HOSPITAL_COMMUNITY): Payer: Managed Care, Other (non HMO) | Admitting: Certified Registered Nurse Anesthetist

## 2016-03-10 ENCOUNTER — Encounter (HOSPITAL_COMMUNITY): Payer: Self-pay | Admitting: *Deleted

## 2016-03-10 ENCOUNTER — Encounter (HOSPITAL_COMMUNITY)
Admission: RE | Disposition: A | Discharge status: Home / Self Care | Payer: Self-pay | Source: Ambulatory Visit | Attending: Gastroenterology

## 2016-03-10 ENCOUNTER — Ambulatory Visit (HOSPITAL_COMMUNITY)
Admission: RE | Admit: 2016-03-10 | Discharge: 2016-03-10 | Disposition: A | Discharge status: Home / Self Care | Payer: Managed Care, Other (non HMO) | Source: Ambulatory Visit | Attending: Gastroenterology | Admitting: Gastroenterology

## 2016-03-10 DIAGNOSIS — K228 Other specified diseases of esophagus: Secondary | ICD-10-CM | POA: Diagnosis not present

## 2016-03-10 DIAGNOSIS — Z7951 Long term (current) use of inhaled steroids: Secondary | ICD-10-CM | POA: Diagnosis not present

## 2016-03-10 DIAGNOSIS — E119 Type 2 diabetes mellitus without complications: Secondary | ICD-10-CM | POA: Diagnosis not present

## 2016-03-10 DIAGNOSIS — Z7984 Long term (current) use of oral hypoglycemic drugs: Secondary | ICD-10-CM | POA: Insufficient documentation

## 2016-03-10 DIAGNOSIS — Z923 Personal history of irradiation: Secondary | ICD-10-CM | POA: Diagnosis not present

## 2016-03-10 DIAGNOSIS — F419 Anxiety disorder, unspecified: Secondary | ICD-10-CM | POA: Insufficient documentation

## 2016-03-10 DIAGNOSIS — Z87891 Personal history of nicotine dependence: Secondary | ICD-10-CM | POA: Diagnosis not present

## 2016-03-10 DIAGNOSIS — Z9221 Personal history of antineoplastic chemotherapy: Secondary | ICD-10-CM | POA: Insufficient documentation

## 2016-03-10 DIAGNOSIS — Z7982 Long term (current) use of aspirin: Secondary | ICD-10-CM | POA: Diagnosis not present

## 2016-03-10 DIAGNOSIS — I447 Left bundle-branch block, unspecified: Secondary | ICD-10-CM | POA: Insufficient documentation

## 2016-03-10 DIAGNOSIS — C159 Malignant neoplasm of esophagus, unspecified: Secondary | ICD-10-CM | POA: Insufficient documentation

## 2016-03-10 DIAGNOSIS — Z79899 Other long term (current) drug therapy: Secondary | ICD-10-CM | POA: Insufficient documentation

## 2016-03-10 DIAGNOSIS — K222 Esophageal obstruction: Secondary | ICD-10-CM | POA: Insufficient documentation

## 2016-03-10 HISTORY — DX: Type 2 diabetes mellitus without complications: E11.9

## 2016-03-10 HISTORY — PX: EUS: SHX5427

## 2016-03-10 LAB — GLUCOSE, CAPILLARY: Glucose-Capillary: 102 mg/dL — ABNORMAL HIGH (ref 65–99)

## 2016-03-10 SURGERY — UPPER ENDOSCOPIC ULTRASOUND (EUS) LINEAR
Anesthesia: Monitor Anesthesia Care

## 2016-03-10 MED ORDER — SODIUM CHLORIDE 0.9 % IV SOLN: INTRAVENOUS | Status: DC

## 2016-03-10 MED ORDER — PROPOFOL 10 MG/ML IV BOLUS
INTRAVENOUS | Status: DC | PRN
  Administered 2016-03-10: 20 mg via INTRAVENOUS

## 2016-03-10 MED ORDER — PROPOFOL 10 MG/ML IV BOLUS
INTRAVENOUS | Status: AC
  Filled 2016-03-10: qty 40

## 2016-03-10 MED ORDER — ONDANSETRON HCL 4 MG/2ML IJ SOLN
INTRAMUSCULAR | Status: DC | PRN
  Administered 2016-03-10: 4 mg via INTRAVENOUS

## 2016-03-10 MED ORDER — LACTATED RINGERS IV SOLN
INTRAVENOUS | Status: DC | PRN
  Administered 2016-03-10: 14:00:00 via INTRAVENOUS

## 2016-03-10 MED ORDER — LACTATED RINGERS IV SOLN: INTRAVENOUS | Status: DC

## 2016-03-10 MED ORDER — PROPOFOL 500 MG/50ML IV EMUL
INTRAVENOUS | Status: DC | PRN
  Administered 2016-03-10: 200 ug/kg/min via INTRAVENOUS

## 2016-03-10 MED ORDER — HEPARIN SOD (PORK) LOCK FLUSH 100 UNIT/ML IV SOLN
500.0000 [IU] | INTRAVENOUS | Status: DC | PRN
  Administered 2016-03-10: 500 [IU]

## 2016-03-10 MED ORDER — ONDANSETRON HCL 4 MG/2ML IJ SOLN
INTRAMUSCULAR | Status: AC
  Filled 2016-03-10: qty 2

## 2016-03-10 MED ORDER — HEPARIN SOD (PORK) LOCK FLUSH 100 UNIT/ML IV SOLN: 500.0000 [IU] | INTRAVENOUS | Status: DC

## 2016-03-10 NOTE — Discharge Instructions (Signed)

## 2016-03-10 NOTE — Anesthesia Postprocedure Evaluation (Signed)
Anesthesia Post Note  Patient: Juan [name]  Procedure(s) Performed: Procedure(s) (LRB): UPPER ENDOSCOPIC ULTRASOUND (EUS) LINEAR (N/A)  Patient location during evaluation: Endoscopy Anesthesia Type: MAC Level of consciousness: awake and alert Pain management: pain level controlled Vital Signs Assessment: post-procedure vital signs reviewed and stable Respiratory status: spontaneous breathing, nonlabored ventilation, respiratory function stable and patient connected to nasal cannula oxygen Cardiovascular status: stable and blood pressure returned to baseline Anesthetic complications: no    Last Vitals:  Vitals:   03/10/16 1530 03/10/16 1535  BP: (!) 151/109   Pulse: 63 (!) 54  Resp: 17 15    Last Pain: There were no vitals filed for this visit.               Montez Hageman

## 2016-03-10 NOTE — Transfer of Care (Signed)
Immediate Anesthesia Transfer of Care Note  Patient: Juan [name]  Procedure(s) Performed: Procedure(s): UPPER ENDOSCOPIC ULTRASOUND (EUS) LINEAR (N/A)  Patient Location: PACU  Anesthesia Type:MAC  Level of Consciousness:  sedated, patient cooperative and responds to stimulation  Airway & Oxygen Therapy:Patient Spontanous Breathing and Patient connected to face mask oxgen  Post-op Assessment:  Report given to PACU RN and Post -op Vital signs reviewed and stable  Post vital signs:  Reviewed and stable  Last Vitals: There were no vitals filed for this visit.  Complications: No apparent anesthesia complications

## 2016-03-10 NOTE — Op Note (Signed)
[facility] Patient Name: Juan [name] Procedure Date: 03/10/2016 MRN: [id number] Attending MD: Carol Ada , MD Date of Birth: April 28, 1940 CSN: [id number] Age: 75 Admit Type: Outpatient Procedure:                Upper EUS Indications:              Esophageal squamous cell                            carcinoma/Post-chemotherapy, Esophageal squamous                            cell carcinoma/Post-radiation Providers:                Carol Ada, MD, Cleda Daub, RN, William [name], Technician Referring MD:              Medicines:                Propofol per Anesthesia Complications:            No immediate complications. Estimated Blood Loss:     Estimated blood loss: none. Procedure:                Pre-Anesthesia Assessment:                           - Prior to the procedure, a History and Physical                            was performed, and patient medications and                            allergies were reviewed. The patient's tolerance of                            previous anesthesia was also reviewed. The risks                            and benefits of the procedure and the sedation                            options and risks were discussed with the patient.                            All questions were answered, and informed consent                            was obtained. Prior Anticoagulants: The patient has                            taken no previous anticoagulant or antiplatelet                            agents. ASA Grade Assessment: III - A patient with  severe systemic disease. After reviewing the risks                            and benefits, the patient was deemed in                            satisfactory condition to undergo the procedure.                           - Sedation was administered by an anesthesia                            professional. Deep sedation was attained.            After obtaining informed consent, the endoscope was                            passed under direct vision. Throughout the                            procedure, the patient's blood pressure, pulse, and                            oxygen saturations were monitored continuously. The                            [id number]) scope was introduced through                            the mouth, and advanced to the second part of                            duodenum. The upper EUS was accomplished without                            difficulty. The patient tolerated the procedure                            well. Scope In: Scope Out: Findings:      Endoscopic Finding :      One moderate benign-appearing, intrinsic stenosis was found 25 cm from       the incisors. This measured 1.1 cm (inner diameter) x 1 cm (in length)       and was traversed.      The entire examined stomach was endoscopically normal.      The examined duodenum was endoscopically normal.      Endosonographic Finding :      Localized wall thickening was visualized endosonographically in the       thoracic esophagus. The thickness of the abnormal layers measured 6 mm.       The esophageal wall measured up to 8 mm in total thickness.      Gross visualization of the esophagus revealed a marked improvement.       There was no gross evidence of any mass in the esophageal lumen.       Stenosis was encountered at 25 cm, but  with gentle pressure the       echoendoscope was able to be maneuvered through the area. A mild       dilation of the mucosa occurred. With the ultrasound view, the esohpagus       was thickened starting around 23 cm and it extended down in to the lower       esophagus. Where the tumor was the thickest, prior to treatment, there       was a marked reduction in volume. The thickness decreased from 12.8 mm       down to 6-7.7 mm. Again, restaging post treatment is not the most       reliable, however, it  appears to be a T3. I cannot differentiate between       actual tumor versus fibrosis/inflammation. Some peritumoral shoddy lymph       nodes were noted. Impression:               - Benign-appearing esophageal stenosis.                           - Normal stomach.                           - Normal examined duodenum.                           - Wall thickening was seen in the thoracic                            esophagus.                           - No specimens collected. Moderate Sedation:      N/A- Per Anesthesia Care Recommendation:           - Patient has a contact number available for                            emergencies. The signs and symptoms of potential                            delayed complications were discussed with the                            patient. Return to normal activities tomorrow.                            Written discharge instructions were provided to the                            patient.                           - Resume previous diet.                           - Continue present medications. Procedure Code(s):        --- Professional ---  [id number], Esophagogastroduodenoscopy, flexible,                            transoral; with endoscopic ultrasound examination                            limited to the esophagus, stomach or duodenum, and                            adjacent structures Diagnosis Code(s):        --- Professional ---                           K22.2, Esophageal obstruction                           K22.8, Other specified diseases of esophagus                           C15.9, Malignant neoplasm of esophagus, unspecified                           Z08, Encounter for follow-up examination after                            completed treatment for malignant neoplasm CPT copyright 2016 [employer]. All rights reserved. The codes documented in this report are preliminary and upon coder review may  be revised  to meet current compliance requirements. Carol Ada, MD Carol Ada, MD 03/10/2016 3:00:39 PM This report has been signed electronically. Number of Addenda: 0

## 2016-03-10 NOTE — Interval H&P Note (Signed)
History and Physical Interval Note:  03/10/2016 1:52 PM  Juan [name]  has presented today for surgery, with the diagnosis of esophageal cancer  The various methods of treatment have been discussed with the patient and family. After consideration of risks, benefits and other options for treatment, the patient has consented to  Procedure(s): UPPER ENDOSCOPIC ULTRASOUND (EUS) LINEAR (N/A) as a surgical intervention .  The patient's history has been reviewed, patient examined, no change in status, stable for surgery.  I have reviewed the patient's chart and labs.  Questions were answered to the patient's satisfaction.     Alazae Crymes D

## 2016-03-10 NOTE — Anesthesia Preprocedure Evaluation (Signed)
Anesthesia Evaluation  Patient identified by MRN, date of birth, ID band Patient awake    Reviewed: Allergy & Precautions, NPO status , Patient's Chart, lab work & pertinent test results  Airway Mallampati: II  TM Distance: >3 FB Neck ROM: Full    Dental no notable dental hx.    Pulmonary neg pulmonary ROS, former smoker,    Pulmonary exam normal breath sounds clear to auscultation       Cardiovascular negative cardio ROS Normal cardiovascular exam Rhythm:Regular Rate:Normal  LBBB   Neuro/Psych negative neurological ROS  negative psych ROS   GI/Hepatic negative GI ROS, Neg liver ROS,   Endo/Other  diabetes, Type 2, Oral Hypoglycemic Agents  Renal/GU negative Renal ROS  negative genitourinary   Musculoskeletal negative musculoskeletal ROS (+)   Abdominal   Peds negative pediatric ROS (+)  Hematology negative hematology ROS (+)   Anesthesia Other Findings   Reproductive/Obstetrics negative OB ROS                             Anesthesia Physical Anesthesia Plan  ASA: II  Anesthesia Plan: MAC   Post-op Pain Management:    Induction:   Airway Management Planned: Nasal Cannula  Additional Equipment:   Intra-op Plan:   Post-operative Plan:   Informed Consent: I have reviewed the patients History and Physical, chart, labs and discussed the procedure including the risks, benefits and alternatives for the proposed anesthesia with the patient or authorized representative who has indicated his/her understanding and acceptance.   Dental advisory given  Plan Discussed with: CRNA  Anesthesia Plan Comments:         Anesthesia Quick Evaluation

## 2016-03-10 NOTE — H&P (View-Only) (Signed)
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 This was accomplished with concurrent chemotherapy using a IMRT technique with daily image guidance.  Narrative:  The patient returns today for routine follow-up. The patient did well in tolerating his radiotherapy and has completed chemotherapy as well. He has met with Dr. Servando Snare and has plans to proceed with repeat endoscopy to evaluate the extent of disease. This will be performed tomorrow and will guide whether or not the patient is a candidate for esophagectomy.                   On review of systems, the patient states he is concerned about whether or not he will become a candidate for surgery and he and his wife report that this keeps him up at night, sometimes pacing the floors. He continues to tolerate liquids and soft foods and is trying to supplement with boost/ensure. He also continues carafate. He denies any abdominal pain, nausea, vomiting, chest pain, or shortness of breath. No other complaints are noted.  ALLERGIES:  has No Known Allergies.  Meds: Current Outpatient Prescriptions  Medication Sig Dispense Refill  . amitriptyline (ELAVIL) 10 MG tablet Take 10 mg by mouth at bedtime. 11/16/15 - Take 1 tablet at Bedtime x 1 Week, then increase to 2 tablets Once Daily Orally- 30 days    . aspirin EC 81 MG tablet Take 81 mg by mouth daily.    Marland Kitchen atorvastatin (LIPITOR) 20 MG tablet Take 20 mg by mouth at bedtime.     . fluticasone (FLONASE) 50 MCG/ACT nasal spray Place 2 sprays into  both nostrils daily. 16 g 2  . lidocaine-prilocaine (EMLA) cream Apply 1 application topically as needed. 30 g 1  . omeprazole (PRILOSEC) 40 MG capsule Take 40 mg by mouth daily.     Marland Kitchen oxyCODONE (ROXICODONE) 15 MG immediate release tablet Take 1 tablet (15 mg total) by mouth every 6 (six) hours as needed for pain. 90 tablet 0  . prochlorperazine (COMPAZINE) 10 MG tablet Take 1 tablet (10 mg total) by mouth every 6 (six) hours as needed (Nausea or vomiting). 30 tablet 1  . sodium chloride (OCEAN) 0.65 % SOLN nasal spray Place 1 spray into both nostrils as needed for congestion. 30 mL 2  . sucralfate (CARAFATE) 1 g tablet TAKE 1 TABLET 4 TIMES A DAY  0  . tamsulosin (FLOMAX) 0.4 MG CAPS capsule Take 1 capsule (0.4 mg total) by mouth daily after supper. 30 capsule 1  . ondansetron (ZOFRAN) 8 MG tablet Take 1 tablet (8 mg total) by mouth 2 (two) times daily as needed for refractory nausea / vomiting. Start on day 3 after chemo. (Patient not taking: Reported on 03/03/2016) 30 tablet 1   No current facility-administered medications for this encounter.     Physical Findings:  height is 5' 9" (1.753 m) and weight is 179 lb 12.8 oz (81.6 kg). His oral temperature is 97.8 F (36.6 C). His blood pressure is 122/87 and his pulse is  71. His respiration is 18.  In general this is a well appearing African American male in no acute distress. He's alert and oriented x4 and appropriate throughout the examination. Cardiopulmonary assessment is negative for acute distress and he exhibits normal effort.   Lab Findings: Lab Results  Component Value Date   WBC 3.0 (L) 03/02/2016   HGB 10.1 (L) 03/02/2016   HCT 32.2 (L) 03/02/2016   MCV 74.2 (L) 03/02/2016   PLT 237 03/02/2016     Radiographic Findings: Dg Chest 2 View  Result Date: 03/02/2016 CLINICAL DATA:  Worsening cough, congestion EXAM: CHEST  2 VIEW COMPARISON:  CT chest 02/24/2016 FINDINGS: There is a right-sided Port-A-Cath in satisfactory position.  There is no focal parenchymal opacity. There is no pleural effusion or pneumothorax. The heart and mediastinal contours are unremarkable. The osseous structures are unremarkable. IMPRESSION: No active cardiopulmonary disease. Electronically Signed   By: Kathreen Devoid   On: 03/02/2016 12:05   Ct Chest W Contrast  Result Date: 02/24/2016 CLINICAL DATA:  Restaging esophageal carcinoma EXAM: CT CHEST, ABDOMEN, AND PELVIS WITH CONTRAST TECHNIQUE: Multidetector CT imaging of the chest, abdomen and pelvis was performed following the standard protocol during bolus administration of intravenous contrast. CONTRAST:  150m ISOVUE-300 IOPAMIDOL (ISOVUE-300) INJECTION 61% COMPARISON:  PET-CT from 12/03/2015 FINDINGS: CT CHEST FINDINGS Cardiovascular: The heart size is normal. Aortic atherosclerosis identified. LAD and left circumflex coronary artery calcifications identified. Mediastinum/Nodes: No mediastinal or hilar adenopathy identified. Circumferential wall thickening involving the mid thoracic esophagus is again noted. This appears improved from previous exam. At the level of the aortic arch the esophagus, at its thickest, measures 1.8 x 1.8 cm, image 16 of series 2. Previously 2.6 x 3.3 cm. Lungs/Pleura: No pleural effusion identified. Mild changes of centrilobular emphysema no suspicious pulmonary nodules identified. Musculoskeletal: Degenerative disc disease is identified within the thoracic spine. No chest wall mass or suspicious bone lesions identified. CT ABDOMEN PELVIS FINDINGS Hepatobiliary: No focal liver abnormality is seen. No gallstones, gallbladder wall thickening, or biliary dilatation. Pancreas: Unremarkable. No pancreatic ductal dilatation or surrounding inflammatory changes. Spleen: Calcified granulomas identified within the spleen. Adrenals/Urinary Tract: The adrenal glands are normal. Parapelvic cyst identified within the left kidney measuring 1.4 cm, image 58 of series 2. Urinary bladder appears  normal. Stomach/Bowel: The stomach is normal. The small bowel loops have a normal course and caliber. No obstruction. Unremarkable appearance of the colon. Vascular/Lymphatic: Aortic atherosclerosis. No enlarged upper abdominal lymph nodes. No pelvic or inguinal adenopathy. Reproductive: Prostate is unremarkable. Other: There is no ascites or focal fluid collections within the abdomen or pelvis. Musculoskeletal: No suspicious bone lesions identified. Spondylosis noted within the lumbar spine. IMPRESSION: 1. Interval improvement and mid esophageal mass. 2. No evidence for metastatic adenopathy or distant metastatic disease. 3. Aortic atherosclerosis and coronary artery calcification. Electronically Signed   By: TKerby MoorsM.D.   On: 02/24/2016 16:33   Ct Abdomen Pelvis W Contrast  Result Date: 02/24/2016 CLINICAL DATA:  Restaging esophageal carcinoma EXAM: CT CHEST, ABDOMEN, AND PELVIS WITH CONTRAST TECHNIQUE: Multidetector CT imaging of the chest, abdomen and pelvis was performed following the standard protocol during bolus administration of intravenous contrast. CONTRAST:  1090mISOVUE-300 IOPAMIDOL (ISOVUE-300) INJECTION 61% COMPARISON:  PET-CT from 12/03/2015 FINDINGS: CT CHEST FINDINGS Cardiovascular: The heart size is normal. Aortic atherosclerosis identified. LAD and left circumflex coronary artery calcifications identified. Mediastinum/Nodes: No mediastinal or hilar adenopathy identified. Circumferential wall thickening involving the mid thoracic esophagus is again noted. This appears improved from  previous exam. At the level of the aortic arch the esophagus, at its thickest, measures 1.8 x 1.8 cm, image 16 of series 2. Previously 2.6 x 3.3 cm. Lungs/Pleura: No pleural effusion identified. Mild changes of centrilobular emphysema no suspicious pulmonary nodules identified. Musculoskeletal: Degenerative disc disease is identified within the thoracic spine. No chest wall mass or suspicious bone lesions  identified. CT ABDOMEN PELVIS FINDINGS Hepatobiliary: No focal liver abnormality is seen. No gallstones, gallbladder wall thickening, or biliary dilatation. Pancreas: Unremarkable. No pancreatic ductal dilatation or surrounding inflammatory changes. Spleen: Calcified granulomas identified within the spleen. Adrenals/Urinary Tract: The adrenal glands are normal. Parapelvic cyst identified within the left kidney measuring 1.4 cm, image 58 of series 2. Urinary bladder appears normal. Stomach/Bowel: The stomach is normal. The small bowel loops have a normal course and caliber. No obstruction. Unremarkable appearance of the colon. Vascular/Lymphatic: Aortic atherosclerosis. No enlarged upper abdominal lymph nodes. No pelvic or inguinal adenopathy. Reproductive: Prostate is unremarkable. Other: There is no ascites or focal fluid collections within the abdomen or pelvis. Musculoskeletal: No suspicious bone lesions identified. Spondylosis noted within the lumbar spine. IMPRESSION: 1. Interval improvement and mid esophageal mass. 2. No evidence for metastatic adenopathy or distant metastatic disease. 3. Aortic atherosclerosis and coronary artery calcification. Electronically Signed   By: Kerby Moors M.D.   On: 02/24/2016 16:33    Impression/Plan: 1. 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 The patient has been referred back to Dr. Benson Norway and tomorrow will proceed with repeat endoscopy and ultrasound as a portion of determining if he is a candidate for esophagectomy. He will follow up in medical oncology in the next few months. I have asked that he return in 6 month's time for continued evaluation. 2. Anxiety regarding medical diagnosis. I have offered a referral to our counselors here at the cancer center, and the patient is interested in proceeding. We will coordinate this at his convenience.     Carola Rhine, PAC

## 2016-03-12 ENCOUNTER — Encounter (HOSPITAL_COMMUNITY): Payer: Self-pay | Admitting: Gastroenterology

## 2016-03-18 ENCOUNTER — Other Ambulatory Visit: Payer: Self-pay | Admitting: Radiation Oncology

## 2016-03-18 DIAGNOSIS — C154 Malignant neoplasm of middle third of esophagus: Secondary | ICD-10-CM

## 2016-03-20 ENCOUNTER — Telehealth: Payer: Self-pay

## 2016-03-20 NOTE — Telephone Encounter (Signed)
Spoke with Juan [name] about faxing his medical records to [employer] and He gave verbal ok to send consult notes to them.

## 2016-03-24 ENCOUNTER — Telehealth: Payer: Self-pay | Admitting: Radiation Oncology

## 2016-03-24 ENCOUNTER — Telehealth: Payer: Self-pay | Admitting: *Deleted

## 2016-03-24 ENCOUNTER — Other Ambulatory Visit: Payer: Self-pay | Admitting: *Deleted

## 2016-03-24 DIAGNOSIS — C154 Malignant neoplasm of middle third of esophagus: Secondary | ICD-10-CM

## 2016-03-24 MED ORDER — OXYCODONE HCL 15 MG PO TABS: 15.0000 mg | ORAL_TABLET | Freq: Four times a day (QID) | ORAL | 0 refills | Status: DC | PRN

## 2016-03-24 MED ORDER — SUCRALFATE 1 G PO TABS: 1.0000 g | ORAL_TABLET | Freq: Four times a day (QID) | ORAL | 0 refills | Status: DC

## 2016-03-24 NOTE — Telephone Encounter (Signed)
Phoned [facility], [place] to confirm receipt of Carafate refill. Spoke with Caryl Pina. She reports the insurance requires the script to be changed from a 30 day supply to a 90. Per Shona Simpson, PA-C order the script was changed to a 90 supply and resubmitted. Patient understands he can pick up the script from his pharmacy later today.

## 2016-03-24 NOTE — Telephone Encounter (Addendum)
At 1110 today left message with Dr. Ernestina Penna nurse that the patient is requesting a refill of his oxycodone 15 mg. Also, explained on the voicemail Shona Simpson, PA-C would refill patient's carafate. Patient aware Dr. Ernestina Penna nurse will be in touch once script is ready for pick up at [facility].

## 2016-03-24 NOTE — Telephone Encounter (Signed)
Pt called requesting refill of Oxycodone. 

## 2016-04-01 ENCOUNTER — Ambulatory Visit: Payer: Managed Care, Other (non HMO) | Admitting: Cardiothoracic Surgery

## 2016-04-03 ENCOUNTER — Encounter: Payer: Self-pay | Admitting: Hematology

## 2016-04-03 NOTE — Progress Notes (Signed)
Faxed FMLA paperwork to [employer] at [phone]

## 2016-04-07 ENCOUNTER — Encounter: Payer: Self-pay | Admitting: Cardiothoracic Surgery

## 2016-04-07 ENCOUNTER — Ambulatory Visit (INDEPENDENT_AMBULATORY_CARE_PROVIDER_SITE_OTHER): Payer: Managed Care, Other (non HMO) | Admitting: Cardiothoracic Surgery

## 2016-04-07 VITALS — BP 145/87 | HR 60 | Resp 20 | Ht 69.0 in | Wt 185.0 lb

## 2016-04-07 DIAGNOSIS — Z923 Personal history of irradiation: Secondary | ICD-10-CM | POA: Diagnosis not present

## 2016-04-07 DIAGNOSIS — Z9221 Personal history of antineoplastic chemotherapy: Secondary | ICD-10-CM | POA: Diagnosis not present

## 2016-04-07 DIAGNOSIS — C159 Malignant neoplasm of esophagus, unspecified: Secondary | ICD-10-CM | POA: Diagnosis not present

## 2016-04-07 NOTE — Progress Notes (Signed)
[address]       [place], [address]             [phone]                    Mearl Lunden [place] Medical Record [id number] Date of Birth: 1940/04/10  Referring: [name], MD Primary Care: [name]) [name], MD (Inactive)  Chief Complaint:    Chief Complaint  Patient presents with  . Esophageal Cancer    f/u to discuss ENDO ultrasound  Esophageal cancer [facility])   Staging form: Esophagus - Adenocarcinoma, AJCC 7th Edition   - Clinical stage from 11/13/2015: Stage IIIB (T3, N2, M0, G2) - Signed by [name], MD on 12/11/2015  History of Present Illness:    Ranferi Sinkfield 76 y.o. male is seen in the office  today for Esophageal cancer-squamous cell -[place] [id number]. The patient notes a "sore throat and painful swallowing for more than a year, associated with weight loss from 196-171. While living in [place] he had been told that he had Barrett's esophagus and had had episodic endoscopies, but I have no reports at this.he saw Dr. Collene Mares for a follow-up colonoscopy, at that time a upper GI endoscopy was performed. A second endoscopy was performed with esophageal ultrasound as noted below :   A mass was found in the cervical esophagus and in the thoracic esophagus. The diagnosis is squamous cell carcinoma. This was staged T3 N1/N2 Mx by endosonographic criteria. From the descriptions of endoscopy is not clear to me if this patient has extensive squamous cell carcinoma involving the cervical and mid thoracic esophagus or 2 separate lesions.    He's completed radiation   therapy on October 18  and chemotherapy  on January 14 2016     At this point his tolerating a soft diet  well, and Gaining weight. He notes he has no swallowing difficultly.   post treatment CT scan and EUS has been done.    THERAPY: concurrent chemotherapy and radiation, with weekly carboplatin AUC 2 and Taxol 45 mg/m, started on 12/10/2015 and ended October 17   Current  Activity/ Functional Status:  Patient is independent with mobility/ambulation, transfers, ADL's, IADL's.   Zubrod Score: At the time of surgery this patient's most appropriate activity status/level should be described as: []     0    Normal activity, no symptoms [x]     1    Restricted in physical strenuous activity but ambulatory, able to do out light work []     2    Ambulatory and capable of self care, unable to do work activities, up and about               >50 % of waking hours                              []     3    Only limited self care, in bed greater than 50% of waking hours []     4    Completely disabled, no self care, confined to bed or chair []     5    Moribund   Past Medical History:  Diagnosis Date  . Diabetes mellitus without complication ([place])   . Esophageal cancer ([place])   . LBBB (left bundle branch block) 02/11/2016  . Reflux     Past Surgical History:  Procedure Laterality Date  .  EUS N/A 12/05/2015   Procedure: UPPER ENDOSCOPIC ULTRASOUND (EUS) LINEAR;  Surgeon: Carol Ada, MD;  Location: [facility] ENDOSCOPY;  Service: Endoscopy;  Laterality: N/A;  . EUS N/A 03/10/2016   Procedure: UPPER ENDOSCOPIC ULTRASOUND (EUS) LINEAR;  Surgeon: Carol Ada, MD;  Location: [facility] ENDOSCOPY;  Service: Endoscopy;  Laterality: N/A;  . HERNIA REPAIR    . IR GENERIC HISTORICAL  12/24/2015   IR FLUORO GUIDE PORT INSERTION RIGHT 12/24/2015 Arne Cleveland, MD [facility]  . IR GENERIC HISTORICAL  12/24/2015   IR US GUIDE VASC ACCESS RIGHT 12/24/2015 Arne Cleveland, MD [facility]  . SHOULDER ARTHROSCOPY W/ SUPERIOR LABRAL ANTERIOR POSTERIOR LESION REPAIR      Family History  Problem Relation Age of Onset  . Colon cancer Father 45  . Colon cancer Brother 15  . Heart attack Brother   . Heart disease Mother   . Heart attack Mother   . Diabetes Sister   . Stroke Sister     Social History   Social History  . Marital status: Married    Spouse name: N/A  . Number of children: N/A  .  Years of education: N/A   Occupational History  . Not on file.   Social History Main Topics  . Smoking status: Former Smoker    Packs/day: 2.00    Years: 40.00    Quit date: 03/31/1999  . Smokeless tobacco: Never Used  . Alcohol use No     Comment: weekend binge drinking for 30-40 years, quit in 2001  . Drug use: No  . Sexual activity: Not on file   Other Topics Concern  . 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 Former Smoker  . Packs/day: 2.00  . Years: 40.00  . Quit date: 03/31/1999  Smokeless Tobacco  . Never Used    History  Alcohol Use No    Comment: weekend binge drinking for 30-40 years, quit in 2001     No Known Allergies  Current Outpatient Prescriptions  Medication Sig Dispense Refill  . amitriptyline (ELAVIL) 10 MG tablet Take 10 mg by mouth at bedtime.     Marland Kitchen aspirin EC 81 MG tablet Take 81 mg by mouth daily.    Marland Kitchen atorvastatin (LIPITOR) 20 MG tablet Take 20 mg by mouth at bedtime.     . lidocaine-prilocaine (EMLA) cream Apply 1 application topically as needed. (Patient taking differently: Apply 1 application topically daily as needed (to draw blood/prior to port access). ) 30 g 1  . ondansetron (ZOFRAN) 8 MG tablet Take 1 tablet (8 mg total) by mouth 2 (two) times daily as needed for refractory nausea / vomiting. Start on day 3 after chemo. (Patient taking differently: Take 8 mg by mouth 2 (two) times daily as needed for refractory nausea / vomiting. For nausea/vomiting Start on day 3 after chemo.) 30 tablet 1  . oxyCODONE (ROXICODONE) 15 MG immediate release tablet Take 1 tablet (15 mg total) by mouth every 6 (six) hours as needed for pain. 90 tablet 0  . pantoprazole (PROTONIX) 40 MG tablet Take 1 tablet (40 mg total) by mouth daily. 30 tablet 0  . prochlorperazine (COMPAZINE) 10 MG tablet Take 1 tablet (10 mg total) by mouth every 6 (six) hours  as needed (Nausea or vomiting). 30 tablet 1  . RABEprazole (ACIPHEX) 20 MG tablet Take 20 mg by  mouth daily before breakfast.  2  . silodosin (RAPAFLO) 8 MG CAPS capsule Take 8 mg by mouth daily with supper.     . sodium chloride (OCEAN) 0.65 % SOLN nasal spray Place 1 spray into both nostrils as needed for congestion. 30 mL 2  . sucralfate (CARAFATE) 1 g tablet Take 1 tablet (1 g total) by mouth 4 (four) times daily. 360 tablet 0   No current facility-administered medications for this visit.       Review of Systems:     Cardiac Review of Systems: Y or N  Chest Pain [ y   ]  Resting SOB [  y ] Exertional SOB  Blue.Reese  ]  Orthopnea Blue.Reese  ]   Pedal Edema [  n ]    Palpitations Florencio.Farrier  ] Syncope  [n  ]   Presyncope [ y  ]  General Review of Systems: [Y] = yes [  ]=no Constitional: recent weight change Blue.Reese  ];  Wt loss over the last 3 months Blue.Reese   ] anorexia [  ]; fatigue [ y ]; nausea [ y ]; night sweats [  ]; fever [  ]; or chills [  ];          Dental: poor dentition[  ]; Last Dentist visit:   Eye : blurred vision [  ]; diplopia [   ]; vision changes [  ];  Amaurosis fugax[  ]; Resp: cough [  ];  wheezing[  ];  hemoptysis[ n ]; shortness of breath[  ]; paroxysmal nocturnal dyspnea[  ]; dyspnea on exertion[  ]; or orthopnea[  ];  GI:  gallstones[  ], vomiting[  ];  dysphagia[y  ]; melena[ n ];  hematochezia [n  ]; heartburn[  ];   Hx of  Colonoscopy[  ]; GU: kidney stones [  ]; hematuria[  ];   dysuria [  ];  nocturia[  ];  history of     obstruction [  ]; urinary frequency [  ]             Skin: rash, swelling[  ];, hair loss[  ];  peripheral edema[  ];  or itching[  ]; Musculosketetal: myalgias[  ];  joint swelling[  ];  joint erythema[  ];  joint pain[  ];  back pain[  ];  Heme/Lymph: bruising[  ];  bleeding[  ];  anemia[  ];  Neuro: TIA[  ];  headaches[  ];  stroke[  ];  vertigo[  ];  seizures[ n ];   paresthesias[  ];  difficulty walking[  ];  Psych:depression[  ]; anxiety[  ];  Endocrine:  diabetes[  ];  thyroid dysfunction[  ];  Immunizations: Flu up to date [ y ]; Pneumococcal up to date [ n ];  Other:  Physical Exam: BP (!) 145/87   Pulse 60   Resp 20   Ht 5\' 9"  (1.753 m)   Wt 185 lb (83.9 kg)   SpO2 93%   BMI 27.32 kg/m   PHYSICAL EXAMINATION:  Patient has no cervical supraclavicular adenopathy right port is in place General appearance: alert and cooperative Head: Normocephalic, without obvious abnormality, atraumatic Neck: no adenopathy, no carotid bruit, no JVD, supple, symmetrical, trachea midline and thyroid not enlarged, symmetric, no tenderness/mass/nodules Lymph nodes: Cervical, supraclavicular, and axillary nodes normal. Resp: clear to auscultation bilaterally Back: symmetric, no curvature. ROM normal. No CVA tenderness. Cardio: regular rate and rhythm, S1, S2 normal, no murmur, click, rub  or gallop GI: soft, non-tender; bowel sounds normal; no masses,  no organomegaly Extremities: extremities normal, atraumatic, no cyanosis or edema and Homans sign is negative, no sign of DVT Neurologic: Grossly normal  Diagnostic Studies & Laboratory data:     Recent Radiology Findings:  Ct Chest W Contrast & Ct Abdomen Pelvis W Contrast  Result Date: 02/24/2016 CLINICAL DATA:  Restaging esophageal carcinoma EXAM: CT CHEST, ABDOMEN, AND PELVIS WITH CONTRAST TECHNIQUE: Multidetector CT imaging of the chest, abdomen and pelvis was performed following the standard protocol during bolus administration of intravenous contrast. CONTRAST:  165mL ISOVUE-300 IOPAMIDOL (ISOVUE-300) INJECTION 61% COMPARISON:  PET-CT from 12/03/2015 FINDINGS: CT CHEST FINDINGS Cardiovascular: The heart size is normal. Aortic atherosclerosis identified. LAD and left circumflex coronary artery calcifications identified. Mediastinum/Nodes: No mediastinal or hilar adenopathy identified. Circumferential wall thickening involving the mid thoracic esophagus is again noted. This appears improved from  previous exam. At the level of the aortic arch the esophagus, at its thickest, measures 1.8 x 1.8 cm, image 16 of series 2. Previously 2.6 x 3.3 cm. Lungs/Pleura: No pleural effusion identified. Mild changes of centrilobular emphysema no suspicious pulmonary nodules identified. Musculoskeletal: Degenerative disc disease is identified within the thoracic spine. No chest wall mass or suspicious bone lesions identified. CT ABDOMEN PELVIS FINDINGS Hepatobiliary: No focal liver abnormality is seen. No gallstones, gallbladder wall thickening, or biliary dilatation. Pancreas: Unremarkable. No pancreatic ductal dilatation or surrounding inflammatory changes. Spleen: Calcified granulomas identified within the spleen. Adrenals/Urinary Tract: The adrenal glands are normal. Parapelvic cyst identified within the left kidney measuring 1.4 cm, image 58 of series 2. Urinary bladder appears normal. Stomach/Bowel: The stomach is normal. The small bowel loops have a normal course and caliber. No obstruction. Unremarkable appearance of the colon. Vascular/Lymphatic: Aortic atherosclerosis. No enlarged upper abdominal lymph nodes. No pelvic or inguinal adenopathy. Reproductive: Prostate is unremarkable. Other: There is no ascites or focal fluid collections within the abdomen or pelvis. Musculoskeletal: No suspicious bone lesions identified. Spondylosis noted within the lumbar spine. IMPRESSION: 1. Interval improvement and mid esophageal mass. 2. No evidence for metastatic adenopathy or distant metastatic disease. 3. Aortic atherosclerosis and coronary artery calcification. Electronically Signed   By: Kerby Moors M.D.   On: 02/24/2016 16:33  Ct Head Wo Contrast  Result Date: 12/19/2015 CLINICAL DATA:  Patient with history of psychiatric issues. History of esophageal carcinoma. EXAM: CT HEAD WITHOUT CONTRAST TECHNIQUE: Contiguous axial images were obtained from the base of the skull through the vertex without intravenous contrast.  COMPARISON:  Brain CT 01/05/2014 FINDINGS: Brain: Ventricles and sulci are appropriate for patient's age. No evidence for acute cortically based infarct, intracranial hemorrhage, mass lesion or mass-effect. Vascular: No hyperdense vessel or unexpected calcification. Skull: Normal. Negative for fracture or focal lesion. Sinuses/Orbits: No acute finding. Other: None. IMPRESSION: No acute intracranial process. Electronically Signed   By: Lovey Newcomer M.D.   On: 12/19/2015 15:51   Nm Pet Image Initial (pi) Skull Base To Thigh  Result Date: 12/03/2015 CLINICAL DATA:  Initial treatment strategy for esophageal cancer of the middle third of the esophagus. EXAM: NUCLEAR MEDICINE PET SKULL BASE TO THIGH TECHNIQUE: 12.7 mCi F-18 FDG was injected intravenously. Full-ring PET imaging was performed from the skull base to thigh after the radiotracer. CT data was obtained and used for attenuation correction and anatomic localization. FASTING BLOOD GLUCOSE:  Value: 11 mg/dl COMPARISON:  CT abdomen 11/13/2015 FINDINGS: NECK No hypermetabolic lymph nodes in the neck. CHEST A 3 cm segment of circumferential esophageal  wall thickening at the level of the carina with intense metabolic activity (SUV max equal 18.2). Single wall thickness measures 1.5 cm High RIGHT paratracheal lymph node measures 7 mm (image 964, series 4) with low metabolic activity the hypermetabolic activity (SUV max equaled 2.1). No hypermetabolic paraesophageal lymph nodes. No suspicious pulmonary nodules. ABDOMEN/PELVIS No hypermetabolic gastrohepatic ligament nodes. No abnormal metabolic activity in the liver. No hypermetabolic retroperitoneal or pelvic lymph nodes. Physiologic activity noted within the colon. SKELETON No focal hypermetabolic activity to suggest skeletal metastasis. IMPRESSION: 1. Long segment of hypermetabolic thickening in the mid esophagus consistent with esophageal carcinoma. 2. No clear evidence of mediastinal nodal metastasis. Single high  RIGHT paratracheal lymph node with low metabolic activity 3. No evidence of liver metastasis or upper abdominal nodal metastasis Electronically Signed   By: Suzy Bouchard M.D.   On: 12/03/2015 10:53      I have independently reviewed the above radiologic studies.  Recent Lab Findings: Lab Results  Component Value Date   WBC 3.0 (L) 03/09/2016   HGB 10.9 (L) 03/09/2016   HCT 34.8 (L) 03/09/2016   PLT 278 03/09/2016   GLUCOSE 121 (H) 03/09/2016   ALT 17 02/24/2016   AST 17 02/24/2016   NA 139 03/09/2016   K 3.8 03/09/2016   CL 105 03/09/2016   CREATININE 0.99 03/09/2016   BUN 6 03/09/2016   CO2 27 03/09/2016   INR 1.02 12/24/2015   Findings: A large, ulcerating mass with no bleeding and stigmata of recent bleeding was found in the upper third of the esophagus and in the middle third of the esophagus. The mass was non-obstructing and partially circumferential (involving one-half of the lumen circumference). Endosonographic Finding A mass was found in the cervical esophagus and in the thoracic esophagus. The procedure was difficult to perform as a result of patient tolerance issues combined with rapid HR and severe HTN. In the upper to mid esophagus a large protruding mass was identified. The surrounding mucosa was friable and ulcerated. There was some initial difficulty with passing the radial echoendoscope through this area. Because of the patient's combativeness and the protrusion of the mass I was not able to obtain an accurate measurement of the mass with endoscopic visualization. Evaluation with ultrasound was challenging, but a large eccentric hypoechoic mass with irregular borders was identified. In the proximal portion it appeared to be noncircumfirential, but in the mid portion and distally the lesion eccentrically circumfirential. At the largest portion of the mass, it measured 12.8 mm in depth. At this point, the mass protruded beyond the adventitia (T3). In the area  it was not clear to me if the mass extended or if it was a collection of lymph nodes. A couple of peritumoral lymph nodes were identified just proximal to this area. Evaluation of the Celiac axis was negative for any evidence of lymph nodes. The left lobe of the liver was negative for any suspicious lesions and the left adrenal was normal in appearance. - Malignant esophageal tumor was found in the upper third of the esophagus and in the middle third of the esophagus. - A mass was found in the cervical esophagus and in the thoracic esophagus. The diagnosis is squamous cell carcinoma. This was staged T3 N1/N2 Mx by endosonographic criteria. - No specimens collected  Repeat EUS: 03/10/2016 Findings: One moderate benign-appearing, intrinsic stenosis was found 25 cm from the incisors. This measured 1.1 cm (inner diameter) x 1 cm (in length) and was traversed. The entire examined stomach  was endoscopically normal. The examined duodenum was endoscopically normal. Endosonographic Finding Localized wall thickening was visualized endosonographically in the thoracic esophagus. The thickness of the abnormal layers measured 6 mm. The esophageal wall measured up to 8 mm in total thickness. Gross visualization of the esophagus revealed a marked improvement. There was no gross evidence of any mass in the esophageal lumen. Stenosis was encountered at 25 cm, but with gentle pressure the echoendoscope was able to be maneuvered through the area. A mild dilation of the mucosa occurred. With the ultrasound view, the esohpagus was thickened starting around 23 cm and it extended down in to the lower esophagus. Where the tumor was the thickest, prior to treatment, there was a marked reduction in volume. The thickness decreased from 12.8 mm down to 6-7.7 mm. Again, restaging post treatment is not the most reliable, however, it appears to be a T3. I cannot differentiate between actual tumor versus  fibrosis/inflammation. Some peritumoral shoddy lymph nodes were noted.  Assessment / Plan:   Patient with advanced squamous cell carcinoma of the esophagus, he is undergone treatment with radiation and chemotherapy with improvement of symptoms and swallowing ability. Now being evaluated for possible esophageal resection. I have  discussed with the patient and his wife in detail the risks and options involved with esophagectomy his age and functional status at the time of his last appointment. He comes in by himself today. He notes that he and his wife do not think he wants to have esophageal resection, with his age, somewhat frail state, I agree with his decision, he is currently well palliated , gaining weight and eating without difficultly. I will plan to see him back in 3 months.  I  spent 30 minutes counseling the patient face to face and 50% or more the  time was spent in counseling and coordination of care. The total time spent in the appointment was 40 minutes.   Grace Isaac MD      [facility] [place], [address] Office [phone]   Beeper [phone]  04/12/2016 9:07 AM   6 Minute Walk Test Results  Patient:            Bienvenido Novosel Date:                01/30/2016   Supplemental O2 during test?            no                                       Baseline                                 End  Time                            1:17                                         1:23      Heartrate                     104  130 Dyspnea                      no                                            no Fatigue                        no                                            no O2 sat                          99%                                         99% Blood pressure            117/83                                 114/83   Patient ambulated at a slow steady pace for a total distance of784 feet with no  stops.  Ambulation was limited primarily due to no issues  Overall the test was tolerated very well.

## 2016-04-14 ENCOUNTER — Emergency Department (HOSPITAL_COMMUNITY)
Admission: EM | Admit: 2016-04-14 | Discharge: 2016-04-14 | Disposition: A | Discharge status: Home / Self Care | Payer: Managed Care, Other (non HMO) | Attending: Emergency Medicine | Admitting: Emergency Medicine

## 2016-04-14 ENCOUNTER — Emergency Department (HOSPITAL_COMMUNITY): Payer: Managed Care, Other (non HMO)

## 2016-04-14 ENCOUNTER — Encounter (HOSPITAL_COMMUNITY): Payer: Self-pay

## 2016-04-14 DIAGNOSIS — Z87891 Personal history of nicotine dependence: Secondary | ICD-10-CM | POA: Insufficient documentation

## 2016-04-14 DIAGNOSIS — Z8501 Personal history of malignant neoplasm of esophagus: Secondary | ICD-10-CM | POA: Diagnosis not present

## 2016-04-14 DIAGNOSIS — R791 Abnormal coagulation profile: Secondary | ICD-10-CM | POA: Insufficient documentation

## 2016-04-14 DIAGNOSIS — Z79899 Other long term (current) drug therapy: Secondary | ICD-10-CM | POA: Diagnosis not present

## 2016-04-14 DIAGNOSIS — E119 Type 2 diabetes mellitus without complications: Secondary | ICD-10-CM | POA: Diagnosis not present

## 2016-04-14 DIAGNOSIS — Z7982 Long term (current) use of aspirin: Secondary | ICD-10-CM | POA: Insufficient documentation

## 2016-04-14 DIAGNOSIS — R040 Epistaxis: Secondary | ICD-10-CM | POA: Insufficient documentation

## 2016-04-14 LAB — CBC WITH DIFFERENTIAL/PLATELET
Basophils Absolute: 0 10*3/uL (ref 0.0–0.1)
Basophils Relative: 0 %
Eosinophils Absolute: 0.1 10*3/uL (ref 0.0–0.7)
Eosinophils Relative: 2 %
HCT: 33.6 % — ABNORMAL LOW (ref 39.0–52.0)
Hemoglobin: 10.3 g/dL — ABNORMAL LOW (ref 13.0–17.0)
Lymphocytes Relative: 22 %
Lymphs Abs: 0.6 10*3/uL — ABNORMAL LOW (ref 0.7–4.0)
MCH: 23.3 pg — ABNORMAL LOW (ref 26.0–34.0)
MCHC: 30.7 g/dL (ref 30.0–36.0)
MCV: 75.8 fL — ABNORMAL LOW (ref 78.0–100.0)
Monocytes Absolute: 0.3 10*3/uL (ref 0.1–1.0)
Monocytes Relative: 9 %
Neutro Abs: 2 10*3/uL (ref 1.7–7.7)
Neutrophils Relative %: 67 %
Platelets: 210 10*3/uL (ref 150–400)
RBC: 4.43 MIL/uL (ref 4.22–5.81)
RDW: 14.8 % (ref 11.5–15.5)
WBC: 3 10*3/uL — ABNORMAL LOW (ref 4.0–10.5)

## 2016-04-14 LAB — COMPREHENSIVE METABOLIC PANEL
ALT: 13 U/L — ABNORMAL LOW (ref 17–63)
AST: 16 U/L (ref 15–41)
Albumin: 3.8 g/dL (ref 3.5–5.0)
Alkaline Phosphatase: 72 U/L (ref 38–126)
Anion gap: 6 (ref 5–15)
BUN: 5 mg/dL — ABNORMAL LOW (ref 6–20)
CO2: 27 mmol/L (ref 22–32)
Calcium: 8.7 mg/dL — ABNORMAL LOW (ref 8.9–10.3)
Chloride: 107 mmol/L (ref 101–111)
Creatinine, Ser: 0.76 mg/dL (ref 0.61–1.24)
GFR calc Af Amer: >60 mL/min (ref >60)
GFR calc non Af Amer: >60 mL/min (ref >60)
Glucose, Bld: 110 mg/dL — ABNORMAL HIGH (ref 65–99)
Potassium: 4 mmol/L (ref 3.5–5.1)
Sodium: 140 mmol/L (ref 135–145)
Total Bilirubin: 0.5 mg/dL (ref 0.3–1.2)
Total Protein: 6.8 g/dL (ref 6.5–8.1)

## 2016-04-14 LAB — PROTIME-INR
INR: 1.05
Prothrombin Time: 13.7 seconds (ref 11.4–15.2)

## 2016-04-14 LAB — APTT: aPTT: 30 seconds (ref 24–36)

## 2016-04-14 NOTE — Discharge Instructions (Signed)
There were no significant or acute abnormalities on the labs or imaging today. There was no recurrence of your complaint during your observation period. Recommend follow-up with your oncologist as soon as possible. Return to the ED should symptoms recur or you have any other major concerns. Continue with all previously prescribed regimens.

## 2016-04-14 NOTE — ED Triage Notes (Signed)
Pt c/o intermittent nosebleed x 1 week and nausea, hemoptysis x 1 episode and upper abdominal pain this morning.  Pain score  7/10.  Pt reports taking a nausea medication, pain medication, and "pills for his throat" last night.  Hx of reflux and esophageal CA.  Pt does not receive chemo or radiation.

## 2016-04-14 NOTE — ED Provider Notes (Signed)
[facility] DEPT Provider Note   CSN: [id number] Arrival date & time: 04/14/16  [id number]     History   Chief Complaint Chief Complaint  Patient presents with  . Hemoptysis  . Epistaxis    HPI Ata Abbitt is a 76 y.o. male.  HPI   [place] Cocca is a 76 y.o. male, with a history of Esophageal cancer, GERD, and DM, presenting to the ED with an episode of hemoptysis that occurred this morning. Pt states he felt as though there was something in his throat, he began gagging and coughing, and then coughed up some bright red blood around the size of a quarter. This has not happened before. Endorses some bright red blood when he blew his nose afterward. Currently endorses some nausea and a taste of blood in his mouth. States the feeling of throat fullness has passed. Denies shortness of breath, trouble swallowing, throat pain, vomiting/diarrhea, hematochezia/melena, abdominal pain, chest pain, or any other complaints.   Pt has current esophogeal cancer. No chemo or radiation treatments currently. Last instance of both treatments was in Oct 2017. Dr. Lisbeth Renshaw was the patient's radiation oncologist. Dr. Burr Medico is patient's oncologist. Last saw Dr. Burr Medico on Feb 26, 2016. Next appointment with both oncologists is Jan 24.  Patient denies anticoagulation.  Past Medical History:  Diagnosis Date  . Diabetes mellitus without complication ([place])   . Esophageal cancer ([place])   . LBBB (left bundle branch block) 02/11/2016  . Reflux     Patient Active Problem List   Diagnosis Date Noted  . LBBB (left bundle branch block) 02/11/2016  . Port catheter in place 12/25/2015  . 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 Esophageal cancer ([place]) 11/22/2015  . Noise effect on both inner ears 08/29/2015  . Right thyroid nodule 08/29/2015    Past Surgical History:  Procedure Laterality Date  . EUS N/A 12/05/2015   Procedure: UPPER ENDOSCOPIC ULTRASOUND (EUS) LINEAR;  Surgeon:  Carol Ada, MD;  Location: [facility] ENDOSCOPY;  Service: Endoscopy;  Laterality: N/A;  . EUS N/A 03/10/2016   Procedure: UPPER ENDOSCOPIC ULTRASOUND (EUS) LINEAR;  Surgeon: Carol Ada, MD;  Location: [facility] ENDOSCOPY;  Service: Endoscopy;  Laterality: N/A;  . HERNIA REPAIR    . IR GENERIC HISTORICAL  12/24/2015   IR FLUORO GUIDE PORT INSERTION RIGHT 12/24/2015 Arne Cleveland, MD [facility]  . IR GENERIC HISTORICAL  12/24/2015   IR US GUIDE VASC ACCESS RIGHT 12/24/2015 Arne Cleveland, MD [facility]  . SHOULDER ARTHROSCOPY W/ SUPERIOR LABRAL ANTERIOR POSTERIOR LESION REPAIR         Home Medications    Prior to Admission medications   Medication Sig Start Date End Date Taking? Authorizing Provider  amitriptyline (ELAVIL) 10 MG tablet Take 10 mg by mouth at bedtime.  11/16/15 11/15/16 Yes Historical Provider, MD  aspirin EC 81 MG tablet Take 81 mg by mouth daily.   Yes Historical Provider, MD  atorvastatin (LIPITOR) 20 MG tablet Take 20 mg by mouth at bedtime.  11/30/13  Yes Historical Provider, MD  lidocaine-prilocaine (EMLA) cream Apply 1 application topically as needed. Patient taking differently: Apply 1 application topically daily as needed (to draw blood/prior to port access).  01/01/16  Yes [name], MD  ondansetron (ZOFRAN) 8 MG tablet Take 1 tablet (8 mg total) by mouth 2 (two) times daily as needed for refractory nausea / vomiting. Start on day 3 after chemo. Patient taking differently: Take 8 mg by mouth 2 (two) times daily  as needed for refractory nausea / vomiting. For nausea/vomiting Start on day 3 after chemo. 01/15/16  Yes [name], MD  oxyCODONE (ROXICODONE) 15 MG immediate release tablet Take 1 tablet (15 mg total) by mouth every 6 (six) hours as needed for pain. 03/24/16  Yes [name], MD  pantoprazole (PROTONIX) 40 MG tablet Take 1 tablet (40 mg total) by mouth daily. 03/09/16  Yes Lorella Nimrod, MD  prochlorperazine (COMPAZINE) 10 MG tablet Take 1 tablet (10 mg total) by mouth every  6 (six) hours as needed (Nausea or vomiting). 01/15/16  Yes [name], MD  silodosin (RAPAFLO) 8 MG CAPS capsule Take 8 mg by mouth daily with supper.    Yes Historical Provider, MD  sodium chloride (OCEAN) 0.65 % SOLN nasal spray Place 1 spray into both nostrils as needed for congestion. 03/02/16  Yes Lorella Nimrod, MD  sucralfate (CARAFATE) 1 g tablet Take 1 tablet (1 g total) by mouth 4 (four) times daily. 03/24/16  Yes Hayden Pedro, PA-C    Family History Family History  Problem Relation Age of Onset  . Colon cancer Father 9  . Colon cancer Brother 74  . Heart attack Brother   . Heart disease Mother   . Heart attack Mother   . Diabetes Sister   . Stroke Sister     Social History Social History  Substance Use Topics  . Smoking status: Former Smoker    Packs/day: 2.00    Years: 40.00    Quit date: 03/31/1999  . Smokeless tobacco: Never Used  . Alcohol use No     Comment: weekend binge drinking for 30-40 years, quit in 2001     Allergies   Patient has no known allergies.   Review of Systems Review of Systems  Constitutional: Negative for chills and fever.  HENT: Negative for trouble swallowing and voice change.   Respiratory: Positive for cough. Negative for shortness of breath.        Hemoptysis  Cardiovascular: Negative for chest pain.  Gastrointestinal: Positive for nausea. Negative for abdominal pain, blood in stool, diarrhea and vomiting.  All other systems reviewed and are negative.    Physical Exam Updated Vital Signs BP 125/91 (BP Location: Left Arm)   Pulse 65   Temp 97.9 F (36.6 C) (Oral)   Resp 16   Ht 5\' 9"  (1.753 m)   Wt 83.9 kg   SpO2 100%   BMI 27.32 kg/m   Physical Exam  Constitutional: He appears well-developed and well-nourished. No distress.  HENT:  Head: Normocephalic and atraumatic.  Mouth/Throat: Oropharynx is clear and moist.  Eyes: Conjunctivae are normal.  Neck: Normal range of motion. Neck supple.  No swelling to the  soft tissues of the neck.  Cardiovascular: Normal rate, regular rhythm, normal heart sounds and intact distal pulses.   Pulmonary/Chest: Effort normal and breath sounds normal. No respiratory distress.  Abdominal: Soft. He exhibits no distension. There is no tenderness. There is no guarding.  Musculoskeletal: He exhibits no edema.  Lymphadenopathy:    He has no cervical adenopathy.  Neurological: He is alert.  Swallow function intact.  Skin: Skin is warm and dry. He is not diaphoretic.  Psychiatric: He has a normal mood and affect. His behavior is normal.  Nursing note and vitals reviewed.    ED Treatments / Results  Labs (all labs ordered are listed, but only abnormal results are displayed) Labs Reviewed  COMPREHENSIVE METABOLIC PANEL - Abnormal; Notable for the following:  Result Value   Glucose, Bld 110 (*)    BUN 5 (*)    Calcium 8.7 (*)    ALT 13 (*)    All other components within normal limits  CBC WITH DIFFERENTIAL/PLATELET - Abnormal; Notable for the following:    WBC 3.0 (*)    Hemoglobin 10.3 (*)    HCT 33.6 (*)    MCV 75.8 (*)    MCH 23.3 (*)    Lymphs Abs 0.6 (*)    All other components within normal limits  PROTIME-INR  APTT    EKG  EKG Interpretation None       Radiology Dg Chest 2 View  Result Date: 04/14/2016 CLINICAL DATA:  Hemoptysis. Hypertension. History of esophageal carcinoma EXAM: CHEST  2 VIEW COMPARISON:  March 09, 2016 FINDINGS: Port-A-Cath tip is in the superior vena cava. No pneumothorax. There is a small granuloma in the left upper lobe peripherally, stable. There is no edema or consolidation. No new nodular opacity evident. Heart size and pulmonary vascular normal. No adenopathy. No evidence of pneumomediastinum. No blastic or lytic bone lesions. IMPRESSION: Stable presumed granuloma left upper lobe near the apex. No edema or consolidation. No adenopathy evident. No pneumomediastinum. Port-A-Cath tip in superior vena cava. No  pneumothorax. Electronically Signed   By: Lowella Grip III M.D.   On: 04/14/2016 11:31    Procedures Procedures (including critical care time)  Medications Ordered in ED Medications - No data to display   Initial Impression / Assessment and Plan / ED Course  I have reviewed the triage vital signs and the nursing notes.  Pertinent labs & imaging results that were available during my care of the patient were reviewed by me and considered in my medical decision making (see chart for details).  Clinical Course     Suspect patient's complaint May have been due to either some bleeding from his esophageal mass or more likely, from epistaxis that flowed down to the back of his throat. Patient is nontoxic appearing and vital signs are stable. He was observed with no changes or recurrence of his complaint. No acute abnormalities on patient's lab work or imaging. Patient appears stable for discharge. Strict return precautions given. Patient advised to follow-up with his oncologist as soon as possible. Patient voiced understanding of all instructions and is comfortable with discharge.  Endoscopy performed on Jan 9 by Dr. Lanelle Bal, CT surgery, found a large, ulcerating mass in the upper third and middle third of the esophagus with surrounding friable mucosa.    Findings and plan of care discussed with Dorie Rank, MD. Dr. Tomi Bamberger personally evaluated and examined this patient.  Vitals:   04/14/16 1008 04/14/16 1009 04/14/16 1223  BP: 125/91  145/88  Pulse: 65  (!) 57  Resp: 16  16  Temp: 97.9 F (36.6 C)  98.2 F (36.8 C)  TempSrc: Oral  Oral  SpO2: 100%  99%  Weight:  83.9 kg   Height:  5\' 9"  (1.753 m)      Final Clinical Impressions(s) / ED Diagnoses   Final diagnoses:  Epistaxis    New Prescriptions Discharge Medication List as of 04/14/2016 12:10 PM       Lorayne Bender, PA-C 04/14/16 [place], MD 04/15/16 2302

## 2016-04-21 NOTE — Progress Notes (Signed)
[name]  [phone] [phone]  Clinic follow Up Note   Patient Care Team: Rama (Merrilee Seashore) [name], MD (Inactive) as PCP - General (Internal Medicine) [name], MD as Consulting Physician (Hematology) Kyung Rudd, MD as Consulting Physician (Radiation Oncology) Tania Ade, RN as Registered Nurse Juanita Craver, MD as Consulting Physician (Gastroenterology) 04/22/2016   CHIEF COMPLAINTS:  Follow up esophageal squamous cell carcinoma  Oncology History   Presented with dysphagia and odynophagia with 20-25 pounds of weight loss in about 3-4 months  Esophageal cancer [facility])   Staging form: Esophagus - Adenocarcinoma, AJCC 7th Edition   - Clinical stage from 11/13/2015: Stage IIIB (T3, N2, M0, G2) - Signed by [name], MD on 12/11/2015      Esophageal cancer ([place])   11/13/2015 Procedure    UPPER ENDOSCOPY per Dr. Collene Mares: Large fungating, friable bleeding mass in middle third of esophagus, 22cm from incisors and extended to 27cm. Non-obstructing.      11/13/2015 Pathology Results    Invasive squamous cell carcinoma; moderately differentiated      11/13/2015 Imaging    CT ABD/PELVIS: IMPRESSION:No evidence of metastatic disease in the abdomen or pelvis. 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 Aortoiliac atherosclerosis. Small bilateral inguinal hernias containing fat the      11/22/2015 Initial Diagnosis    Esophageal cancer ([place])     12/03/2015 Imaging    PET scan showed a long segment of hypermetabolic thickening in the mid esophagus consistent with esophageal carcinoma. No clear evidence of node metastasis or distant metastasis.      12/10/2015 - 01/14/2016 Radiation Therapy    Neoadjuvant radiation to his esophageal cancer      12/10/2015 - 01/15/2016 Chemotherapy    Neoadjuvant weekly carboplatin AUC 2, and Taxol 45 mg/m, with concurrent radiation. He developed steroid-induced psychosis, and Taxol was changed to Abraxane to avoid  premedication with steroids.      02/24/2016 Imaging    CT CAP with contrast showed interval improvement mid esophageal mass, no evidence for metastatic adenopathy or distant metastasis.       03/10/2016 Procedure    Repeat EGD by Dr. Benson Norway showed wall thickening in the thoracic esophagus with the tumor was. The thickness decreased from 12.8 mm to 6-7 mm. Not able to differentiate between actual tumor versus fibrosis from radiation. Some peritumoral shoddy lymph nodes.      04/07/2016 Surgery    Patient followed up with Dr. Servando Snare, patient is not a great candidate for surgery, pt also does not want surgery, mutually agreed to not pursue esophagectomy.        HISTORY OF PRESENTING ILLNESS:  Juan [name] 76 y.o. male is here because of His recently diagnosed esophageal cancer. He is accompanied by his wife to my clinic today.  He started having sore throat and dysphagia about 4-5 months ago, and was seen by PCP, had 2 course antibiotics but is symptom progressed. He was referred to gastroenterologist Dr. Suezanne Cheshire, and underwent EGD on 11/13/2015, which showed a large fungating, friable bleeding mass in the middle third of esophagus, 22 cm to 27 cm from incisors, non-obstructing. The biopsy showed squamous cell carcinoma. His CT abdomen and pelvis was negative for metastatic disease.  He has lost 20lbs, he is on soft diet and liquids, his pain is about 7/10 pain with swallowing. He also has chest pain , intermittent, it lasts about a few minutes, no nausea or vomiting, his bowel movement is normal. He denies melena or  hematochezia. He is able to function and tolerated light activities at home.  CURRENT THERAPY: Observation  INTERIM HISTORY: Juan [name] returns for follow-up. He has decided not to have esophageal surgery. Since our last visit, the patient went to the ED with epistaxis and hemoptysis. He had upper endoscopic ultrasound with Dr. Benson Norway on 03/10/2016. He reports feeling weak nearly every  day. After walking from the bedroom to the kitchen, he feels weak. This has been happening since finishing radiation and chemotherapy. He reports chronic back pain on the left side which extends down his left leg. His throat becomes sore intermittently. He denies dysphagia. His wife notes he complains of feeling sick all the time, but he walks around all night. He is unable to lay down because of his back pain. He is unable to stand up straight due to his back pain. He was unable to straighten his back prior to the cancer. He takes oxycodone twice daily for the pain. His wife reports he sweats a lot. He is not taking a multivitamin or iron supplement.   MEDICAL HISTORY:  Past Medical History:  Diagnosis Date  . Diabetes mellitus without complication ([place])   . Esophageal cancer ([place])   . LBBB (left bundle branch block) 02/11/2016  . Reflux     SURGICAL HISTORY: Past Surgical History:  Procedure Laterality Date  . EUS N/A 12/05/2015   Procedure: UPPER ENDOSCOPIC ULTRASOUND (EUS) LINEAR;  Surgeon: Carol Ada, MD;  Location: [facility] END[facility];  Service: Endoscopy;  Laterality: N/A;  . EUS N/A 03/10/2016   Procedure: UPPER ENDOSCOPIC ULTRASOUND (EUS) LINEAR;  Surgeon: Carol Ada, MD;  Location: [facility] ENDOSCOPY;  Service: Endoscopy;  Laterality: N/A;  . HERNIA REPAIR    . IR GENERIC HISTORICAL  12/24/2015   IR FLUORO GUIDE PORT INSERTION RIGHT 12/24/2015 Arne Cleveland, MD [facility]  . IR GENERIC HISTORICAL  12/24/2015   IR US GUIDE VASC ACCESS RIGHT 12/24/2015 Arne Cleveland, MD [facility]  . SHOULDER ARTHROSCOPY W/ SUPERIOR LABRAL ANTERIOR POSTERIOR LESION REPAIR      SOCIAL HISTORY: Social History   Social History  . Marital status: Married    Spouse name: N/A  . Number of children: N/A  . Years of education: N/A   Occupational History  . Not on file.   Social History Main Topics  . Smoking status: Former Smoker    Packs/day: 2.00    Years: 40.00    Quit date: 03/31/1999  .  Smokeless tobacco: Never Used  . Alcohol use No     Comment: weekend binge drinking for 30-40 years, quit in 2001  . Drug use: No  . Sexual activity: Not on file   Other Topics Concern  . 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 Colon cancer Father 83  . Colon cancer Brother 43  . Heart attack Brother   . Heart disease Mother   . Heart attack Mother   . Diabetes Sister   . Stroke Sister     ALLERGIES:  has No Known Allergies.  MEDICATIONS:  Current Outpatient Prescriptions  Medication Sig Dispense Refill  . alfuzosin (UROXATRAL) 10 MG 24 hr tablet Take 10 mg by mouth daily with breakfast.    .  amitriptyline (ELAVIL) 10 MG tablet Take 10 mg by mouth at bedtime.     Marland Kitchen aspirin EC 81 MG tablet Take 81 mg by mouth daily.    Marland Kitchen atorvastatin (LIPITOR) 20 MG tablet Take 20 mg by mouth at bedtime.     . lidocaine-prilocaine (EMLA) cream Apply 1 application topically as needed. (Patient taking differently: Apply 1 application topically daily as needed (to draw blood/prior to port access). ) 30 g 1  . ondansetron (ZOFRAN) 8 MG tablet Take 1 tablet (8 mg total) by mouth 2 (two) times daily as needed for refractory nausea / vomiting. Start on day 3 after chemo. (Patient taking differently: Take 8 mg by mouth 2 (two) times daily as needed for refractory nausea / vomiting. For nausea/vomiting Start on day 3 after chemo.) 30 tablet 1  . oxyCODONE (ROXICODONE) 15 MG immediate release tablet Take 1 tablet (15 mg total) by mouth every 6 (six) hours as needed for pain. 90 tablet 0  . pantoprazole (PROTONIX) 40 MG tablet Take 1 tablet (40 mg total) by mouth daily. 30 tablet 0  . prochlorperazine (COMPAZINE) 10 MG tablet Take 1 tablet (10 mg total) by mouth every  6 (six) hours as needed (Nausea or vomiting). 30 tablet 1  . sodium chloride (OCEAN) 0.65 % SOLN nasal spray Place 1 spray into both nostrils as needed for congestion. 30 mL 2  . sucralfate (CARAFATE) 1 g tablet Take 1 tablet (1 g total) by mouth 4 (four) times daily. 360 tablet 0   No current facility-administered medications for this visit.     REVIEW OF SYSTEMS:   Constitutional: Denies fevers, chills or abnormal night sweats (+) fatigue, weakness Eyes: Denies blurriness of vision, double vision or watery eyes Ears, nose, mouth, throat, and face: Denies mucositis (+) occasional sore throat Respiratory: Denies cough, dyspnea or wheezes Cardiovascular: Denies palpitation, chest discomfort or lower extremity swelling Gastrointestinal:  Denies nausea, heartburn or change in bowel habits Skin: Denies abnormal skin rashes Musculoskeletal: (+) back pain, mostly left sided and extending into the left leg Lymphatics: Denies new lymphadenopathy or easy bruising Neurological:Denies numbness, tingling or new weaknesses Behavioral/Psych: Mood is stable, no new changes (+) insomnia secondary to back pain All other systems were reviewed with the patient and are negative.  PHYSICAL EXAMINATION: ECOG PERFORMANCE STATUS: 1 - Symptomatic but completely ambulatory  Vitals:   04/22/16 1106  BP: (!) 150/96  Pulse: 80  Resp: 18  Temp: 98.3 F (36.8 C)   Filed Weights   04/22/16 1106  Weight: 181 lb 6.4 oz (82.3 kg)    GENERAL:alert, no distress and comfortable SKIN: skin color, texture, turgor are normal, no rashes or significant lesions EYES: normal, conjunctiva are pink and non-injected, sclera clear OROPHARYNX:no exudate, no erythema and lips, buccal mucosa, and tongue normal  NECK: supple, thyroid normal size, non-tender, without nodularity LYMPH:  no palpable lymphadenopathy in the cervical, axillary or inguinal LUNGS: clear to auscultation and percussion with normal breathing  effort HEART: regular rate & rhythm and no murmurs and no lower extremity edema ABDOMEN:abdomen soft, non-tender and normal bowel sounds Musculoskeletal:no cyanosis of digits and no clubbing  PSYCH: alert & oriented x 3 with fluent speech NEURO: no focal motor/sensory deficits  LABORATORY DATA:  I have reviewed the data as listed CBC Latest Ref Rng & Units 04/22/2016 04/14/2016 03/09/2016  WBC 4.0 - 10.3 10e3/uL 3.3(L) 3.0(L) 3.0(L)  Hemoglobin 13.0 - 17.1 g/dL 11.2(L) 10.3(L) 10.9(L)  Hematocrit 38.4 - 49.9 % 36.1(L) 33.6(L)  34.8(L)  Platelets 140 - 400 10e3/uL 247 210 278   CMP Latest Ref Rng & Units 04/22/2016 04/14/2016 03/09/2016  Glucose 70 - 140 mg/dl 108 110(H) 121(H)  BUN 7.0 - 26.0 mg/dL 8.0 5(L) 6  Creatinine 0.7 - 1.3 mg/dL 1.0 0.76 0.99  Sodium 136 - 145 mEq/L 143 140 139  Potassium 3.5 - 5.1 mEq/L 4.3 4.0 3.8  Chloride 101 - 111 mmol/L - 107 105  CO2 22 - 29 mEq/L 25 27 27   Calcium 8.4 - 10.4 mg/dL 9.7 8.7(L) 9.0  Total Protein 6.4 - 8.3 g/dL 7.5 6.8 -  Total Bilirubin 0.20 - 1.20 mg/dL 0.64 0.5 -  Alkaline Phos 40 - 150 U/L 90 72 -  AST 5 - 34 U/L 13 16 -  ALT 0 - 55 U/L 11 13(L) -    PATHOLOGY REPORT  Final microscopic diagnosis Esophagus, 0.2 cm to 27 cm, biopsy -Invasive squamous cell carcinoma, moderately differentiated   RADIOGRAPHIC STUDIES: I have personally reviewed the radiological images as listed and agreed with the findings in the report. Dg Chest 2 View  Result Date: 04/14/2016 CLINICAL DATA:  Hemoptysis. Hypertension. History of esophageal carcinoma EXAM: CHEST  2 VIEW COMPARISON:  March 09, 2016 FINDINGS: Port-A-Cath tip is in the superior vena cava. No pneumothorax. There is a small granuloma in the left upper lobe peripherally, stable. There is no edema or consolidation. No new nodular opacity evident. Heart size and pulmonary vascular normal. No adenopathy. No evidence of pneumomediastinum. No blastic or lytic bone lesions. IMPRESSION: Stable  presumed granuloma left upper lobe near the apex. No edema or consolidation. No adenopathy evident. No pneumomediastinum. Port-A-Cath tip in superior vena cava. No pneumothorax. Electronically Signed   By: Lowella Grip III M.D.   On: 04/14/2016 11:31   EGD 11/13/2015 Dr. Collene Mares  -A large, fungating, friable mass with bleeding was found in the mid third of the esophagus, 22 cm from the incisors and extended to 27 cm. The mass was nonobstructing and a circumferential, biopsy was taken. -Normal stomach -Normal proximal small bowel  Colonoscopy 11/13/2015 Dr. Collene Mares -Diverticulosis in the entire exam: -No specimens collected  EUS 12/05/2015 Malignant esophageal tumor was found in the upper third of the esophagus and in the middle third of the esophagus. - A mass was found in the cervical esophagus and in the thoracic esophagus. The diagnosis is squamous cell carcinoma. This was staged T3 N1/N2 Mx by endosonographic criteria. - No specimens collected.  EUS 03/10/2016 DR. HUNG  the esohpagus was thickened starting around 23 cm and it extended down in to the lower esophagus. Where the tumor was the thickest, prior to treatment, there was a marked reduction in volume. The thickness decreased from 12.8 mm down to 6-7.7 mm. Again, restaging post treatment is not the most reliable, however, it appears to be a T3. I cannot differentiate between actual tumor versus fibrosis/inflammation. Some peritumoral shoddy lymph nodes were noted. IMPRESSION:  - Benign-appearing esophageal stenosis. - Normal stomach. - Normal examined duodenum. - Wall thickening was seen in the thoracic esophagus. - No specimens collected.    ASSESSMENT & PLAN: 76 year old gentleman, without significant past medical history except acid reflux, presented with progressive dysphagia and odynophagia and weight loss.  1. Esophageal cancer, squamous cell carcinoma, cT3N1-2M0, stage IIIB -I previously reviewed his EGD findings,  biopsy results, and the CT scan findings with patient and his wife in great details -I previously dicussed staging PET scan findings with patient and his wife, the  mid esophageal mass is hypermetabolic, no distant metastasis. -EUS revealed a T3 lesion, N1 vs N2, locally advanced disease. -He received neoadjuvant radiation and chemotherapy with weekly carboplatin and Taxol -Taxol was changed to Abraxane due to cirrhosis induced psychosis -Patient and Dr. Servando Snare has mutually agreed not to pursue esophagectomy due to his advanced age and general health condition -We discussed that his esophageal cancer is unlikely to by chemoradiation. I reviewed his repeated EUS after his chemotherapy and irradiation, which showed good response to chemoradiation, but possible residual tumor. -I'll obtain a restaging PET scan next month. If negative, will follow up clinically.  2. Dysphagia, resolved  -His dysphagia has resolved after chemotherapy and irradiation -follow up with dietician Pamala Hurry  -He has started gaining some weight back  3. Steroids induced psychosis -Resolved now. We'll avoid steroids in the future.  4. Leukopenia and anemia -Secondary to chemotherapy radiation -We'll continue monitoring, no need for blood transfusion  5. Chronic back pain -Encouraged the patient to follow with primary care physician for back pain -I will not refill his narcotics   Recommendations -He will return for port flush in the next week -He will see Dr. Servando Snare again on 07/02/2016 -PET in 4 weeks. I will speak with Dr. Servando Snare and Dr. Benson Norway about his f/u and surveillance -He will return for follow up and lab in 4 weeks to discuss PET results.  All questions were answered. The patient knows to call the clinic with any problems, questions or concerns.  I spent 20 minutes counseling the patient face to face. The total time spent in the appointment was 25 minutes and more than 50% was on counseling.   This  document serves as a record of services personally performed by [name], MD. It was created on her behalf by Arlyce Harman, a trained medical scribe. The creation of this record is based on the scribe's personal observations and the provider's statements to them. This document has been checked and approved by the attending provider.   [name], MD 04/22/2016

## 2016-04-22 ENCOUNTER — Telehealth: Payer: Self-pay | Admitting: Hematology

## 2016-04-22 ENCOUNTER — Encounter: Payer: Self-pay | Admitting: Hematology

## 2016-04-22 ENCOUNTER — Other Ambulatory Visit (HOSPITAL_BASED_OUTPATIENT_CLINIC_OR_DEPARTMENT_OTHER): Payer: Managed Care, Other (non HMO)

## 2016-04-22 ENCOUNTER — Ambulatory Visit (HOSPITAL_BASED_OUTPATIENT_CLINIC_OR_DEPARTMENT_OTHER): Payer: Managed Care, Other (non HMO) | Admitting: Hematology

## 2016-04-22 VITALS — BP 150/96 | HR 80 | Temp 98.3°F | Resp 18 | Ht 69.0 in | Wt 181.4 lb

## 2016-04-22 DIAGNOSIS — C154 Malignant neoplasm of middle third of esophagus: Secondary | ICD-10-CM

## 2016-04-22 DIAGNOSIS — D6481 Anemia due to antineoplastic chemotherapy: Secondary | ICD-10-CM | POA: Diagnosis not present

## 2016-04-22 DIAGNOSIS — M549 Dorsalgia, unspecified: Secondary | ICD-10-CM | POA: Diagnosis not present

## 2016-04-22 DIAGNOSIS — G8929 Other chronic pain: Secondary | ICD-10-CM

## 2016-04-22 DIAGNOSIS — D72818 Other decreased white blood cell count: Secondary | ICD-10-CM

## 2016-04-22 LAB — COMPREHENSIVE METABOLIC PANEL
ALT: 11 U/L (ref 0–55)
AST: 13 U/L (ref 5–34)
Albumin: 3.8 g/dL (ref 3.5–5.0)
Alkaline Phosphatase: 90 U/L (ref 40–150)
Anion Gap: 10 mEq/L (ref 3–11)
BUN: 8 mg/dL (ref 7.0–26.0)
CO2: 25 mEq/L (ref 22–29)
Calcium: 9.7 mg/dL (ref 8.4–10.4)
Chloride: 108 mEq/L (ref 98–109)
Creatinine: 1 mg/dL (ref 0.7–1.3)
EGFR: 90 mL/min/{1.73_m2} — ABNORMAL LOW (ref >90)
Glucose: 108 mg/dl (ref 70–140)
Potassium: 4.3 mEq/L (ref 3.5–5.1)
Sodium: 143 mEq/L (ref 136–145)
Total Bilirubin: 0.64 mg/dL (ref 0.20–1.20)
Total Protein: 7.5 g/dL (ref 6.4–8.3)

## 2016-04-22 LAB — CBC WITH DIFFERENTIAL/PLATELET
BASO%: 0.8 % (ref 0.0–2.0)
Basophils Absolute: 0 10*3/uL (ref 0.0–0.1)
EOS%: 1.5 % (ref 0.0–7.0)
Eosinophils Absolute: 0.1 10*3/uL (ref 0.0–0.5)
HCT: 36.1 % — ABNORMAL LOW (ref 38.4–49.9)
HGB: 11.2 g/dL — ABNORMAL LOW (ref 13.0–17.1)
LYMPH%: 21.7 % (ref 14.0–49.0)
MCH: 23.6 pg — ABNORMAL LOW (ref 27.2–33.4)
MCHC: 31.1 g/dL — ABNORMAL LOW (ref 32.0–36.0)
MCV: 75.9 fL — ABNORMAL LOW (ref 79.3–98.0)
MONO#: 0.4 10*3/uL (ref 0.1–0.9)
MONO%: 13 % (ref 0.0–14.0)
NEUT#: 2.1 10*3/uL (ref 1.5–6.5)
NEUT%: 63 % (ref 39.0–75.0)
Platelets: 247 10*3/uL (ref 140–400)
RBC: 4.76 10*6/uL (ref 4.20–5.82)
RDW: 14.5 % (ref 11.0–14.6)
WBC: 3.3 10*3/uL — ABNORMAL LOW (ref 4.0–10.3)
lymph#: 0.7 10*3/uL — ABNORMAL LOW (ref 0.9–3.3)

## 2016-04-22 NOTE — Telephone Encounter (Signed)
Gave patient avs report and appointments for January and February. Central radiology will call re scan.  °

## 2016-04-24 ENCOUNTER — Encounter (HOSPITAL_COMMUNITY): Payer: Self-pay | Admitting: *Deleted

## 2016-04-24 ENCOUNTER — Ambulatory Visit (HOSPITAL_COMMUNITY)
Admission: EM | Admit: 2016-04-24 | Discharge: 2016-04-24 | Disposition: A | Discharge status: Home / Self Care | Payer: Managed Care, Other (non HMO) | Attending: Family Medicine | Admitting: Family Medicine

## 2016-04-24 DIAGNOSIS — M5432 Sciatica, left side: Secondary | ICD-10-CM | POA: Diagnosis not present

## 2016-04-24 DIAGNOSIS — C154 Malignant neoplasm of middle third of esophagus: Secondary | ICD-10-CM

## 2016-04-24 MED ORDER — OXYCODONE HCL 15 MG PO TABS: 15.0000 mg | ORAL_TABLET | Freq: Four times a day (QID) | ORAL | 0 refills | Status: DC | PRN

## 2016-04-24 MED ORDER — PREDNISONE 20 MG PO TABS: ORAL_TABLET | ORAL | 0 refills | Status: DC

## 2016-04-24 NOTE — ED Provider Notes (Signed)
[place]    CSN: [id number] Arrival date & time: 04/24/16  1125     History   Chief Complaint Chief Complaint  Patient presents with  . Back Pain    HPI Juan [name] is a 76 y.o. male.   Note from Dr. Burr Medico, oncologist HISTORY OF PRESENTING ILLNESS:  Juan [name] 76 y.o. male is here because of His recently diagnosed esophageal cancer. He is accompanied by his wife to my clinic today.  He started having sore throat and dysphagia about 4-5 months ago, and was seen by PCP, had 2 course antibiotics but is symptom progressed. He was referred to gastroenterologist Dr. Suezanne Cheshire, and underwent EGD on 11/13/2015, which showed a large fungating, friable bleeding mass in the middle third of esophagus, 22 cm to 27 cm from incisors, non-obstructing. The biopsy showed squamous cell carcinoma. His CT abdomen and pelvis was negative for metastatic disease.  He has lost 20lbs, he is on soft diet and liquids, his pain is about 7/10 pain with swallowing. He also has chest pain , intermittent, it lasts about a few minutes, no nausea or vomiting, his bowel movement is normal. He denies melena or hematochezia. He is able to function and tolerated light activities at home.  CURRENT THERAPY: Observation  INTERIM HISTORY: [name] returns for follow-up. He has decided not to have esophageal surgery. Since our last visit, the patient went to the ED with epistaxis and hemoptysis. He had upper endoscopic ultrasound with Dr. Benson Norway on 03/10/2016. He reports feeling weak nearly every day. After walking from the bedroom to the kitchen, he feels weak. This has been happening since finishing radiation and chemotherapy. He reports chronic back pain on the left side which extends down his left leg. His throat becomes sore intermittently. He denies dysphagia. His wife notes he complains of feeling sick all the time, but he walks around all night. He is unable to lay down because of his back pain. He is unable to  stand up straight due to his back pain. He was unable to straighten his back prior to the cancer. He takes oxycodone twice daily for the pain. His wife reports he sweats a lot. He is not taking a multivitamin or iron supplement.   Today's note: Patient's had 4 days of low back pain radiating to his left thigh and lower leg. He's had no fever or weakness in the leg but he noticed that shaking summertime. He's also had no problems passing his water.  Patient states that since his chemotherapy is able to swallow normally. He has an appointment with his primary care doctor next Tuesday.      Past Medical History:  Diagnosis Date  . Diabetes mellitus without complication ([place])   . Esophageal cancer ([place])   . LBBB (left bundle branch block) 02/11/2016  . Reflux     Patient Active Problem List   Diagnosis Date Noted  . LBBB (left bundle branch block) 02/11/2016  . Port catheter in place 12/25/2015  . 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 Esophageal cancer ([place]) 11/22/2015  . Noise effect on both inner ears 08/29/2015  . Right thyroid nodule 08/29/2015    Past Surgical History:  Procedure Laterality Date  . EUS N/A 12/05/2015   Procedure: UPPER ENDOSCOPIC ULTRASOUND (EUS) LINEAR;  Surgeon: Carol Ada, MD;  [facility];  Service: Endoscopy;  Laterality: N/A;  . EUS N/A 03/10/2016   Procedure: UPPER ENDOSCOPIC ULTRASOUND (EUS) LINEAR;  Surgeon:  Carol Ada, MD;  Location: [facility];  Service: Endoscopy;  Laterality: N/A;  . HERNIA REPAIR    . IR GENERIC HISTORICAL  12/24/2015   IR FLUORO GUIDE PORT INSERTION RIGHT 12/24/2015 Arne Cleveland, MD [facility]  . IR GENERIC HISTORICAL  12/24/2015   IR US GUIDE VASC ACCESS RIGHT 12/24/2015 Arne Cleveland, MD [facility]  . SHOULDER ARTHROSCOPY W/ SUPERIOR LABRAL ANTERIOR POSTERIOR LESION REPAIR         Home Medications    Prior to Admission medications   Medication Sig Start  Date End Date Taking? Authorizing Provider  alfuzosin (UROXATRAL) 10 MG 24 hr tablet Take 10 mg by mouth daily with breakfast.    Historical Provider, MD  amitriptyline (ELAVIL) 10 MG tablet Take 10 mg by mouth at bedtime.  11/16/15 11/15/16  Historical Provider, MD  aspirin EC 81 MG tablet Take 81 mg by mouth daily.    Historical Provider, MD  atorvastatin (LIPITOR) 20 MG tablet Take 20 mg by mouth at bedtime.  11/30/13   Historical Provider, MD  oxyCODONE (ROXICODONE) 15 MG immediate release tablet Take 1 tablet (15 mg total) by mouth every 6 (six) hours as needed for pain. 04/24/16   Robyn Haber, MD  pantoprazole (PROTONIX) 40 MG tablet Take 1 tablet (40 mg total) by mouth daily. 03/09/16   Lorella Nimrod, MD  predniSONE (DELTASONE) 20 MG tablet Two daily with food 04/24/16   Robyn Haber, MD  prochlorperazine (COMPAZINE) 10 MG tablet Take 1 tablet (10 mg total) by mouth every 6 (six) hours as needed (Nausea or vomiting). 01/15/16   [name], MD  sodium chloride (OCEAN) 0.65 % SOLN nasal spray Place 1 spray into both nostrils as needed for congestion. 03/02/16   Lorella Nimrod, MD  sucralfate (CARAFATE) 1 g tablet Take 1 tablet (1 g total) by mouth 4 (four) times daily. 03/24/16   Hayden Pedro, PA-C    Family History Family History  Problem Relation Age of Onset  . Colon cancer Father 27  . Colon cancer Brother 67  . Heart attack Brother   . Heart disease Mother   . Heart attack Mother   . Diabetes Sister   . Stroke Sister     Social History Social History  Substance Use Topics  . Smoking status: Former Smoker    Packs/day: 2.00    Years: 40.00    Quit date: 03/31/1999  . Smokeless tobacco: Never Used  . Alcohol use No     Comment: weekend binge drinking for 30-40 years, quit in 2001     Allergies   Patient has no known allergies.   Review of Systems Review of Systems  Constitutional: Negative.   HENT: Negative.   Musculoskeletal: Positive for back pain.    Neurological: Negative.      Physical Exam Triage Vital Signs ED Triage Vitals  Enc Vitals Group     BP 04/24/16 1236 148/78     Pulse Rate 04/24/16 1236 78     Resp 04/24/16 1236 18     Temp 04/24/16 1236 98.6 F (37 C)     Temp Source 04/24/16 1236 Oral     SpO2 04/24/16 1236 100 %     Weight --      Height --      Head Circumference --      Peak Flow --      Pain Score 04/24/16 1237 6     Pain Loc --      Pain  Edu? --      Excl. in [place]? --    No data found.   Updated Vital Signs BP 148/78 (BP Location: Right Arm)   Pulse 78   Temp 98.6 F (37 C) (Oral)   Resp 18   SpO2 100%       Physical Exam  Constitutional: He is oriented to person, place, and time. He appears well-developed and well-nourished.  HENT:  Head: Normocephalic.  Right Ear: External ear normal.  Left Ear: External ear normal.  Mouth/Throat: Oropharynx is clear and moist.  Eyes: Conjunctivae are normal. Pupils are equal, round, and reactive to light.  Neck: Normal range of motion. Neck supple.  Pulmonary/Chest: Effort normal.  Musculoskeletal:  Positive straight leg raising on the left  Neurological: He is alert and oriented to person, place, and time.  Skin: Skin is warm and dry.  Nursing note and vitals reviewed.    UC Treatments / Results  Labs (all labs ordered are listed, but only abnormal results are displayed) Labs Reviewed - No data to display  EKG  EKG Interpretation None       Radiology No results found.  Procedures Procedures (including critical care time)  Medications Ordered in UC Medications - No data to display   Initial Impression / Assessment and Plan / UC Course  I have reviewed the triage vital signs and the nursing notes.  Pertinent labs & imaging results that were available during my care of the patient were reviewed by me and considered in my medical decision making (see chart for details).     Final Clinical Impressions(s) / UC Diagnoses    Final diagnoses:  Sciatica of left side    New Prescriptions New Prescriptions   PREDNISONE (DELTASONE) 20 MG TABLET    Two daily with food     Robyn Haber, MD 04/24/16 1254

## 2016-04-24 NOTE — ED Triage Notes (Signed)
Pt  Reports  Low   bqck  Pain   With  Pain  Radiating   Down  His  l  Leg     X  1  Week   denys any specefic injury   Pt  Ambulated  To  Room  With  Slow   gait

## 2016-04-28 DIAGNOSIS — S3992XA Unspecified injury of lower back, initial encounter: Secondary | ICD-10-CM | POA: Diagnosis not present

## 2016-04-28 DIAGNOSIS — M545 Low back pain: Secondary | ICD-10-CM | POA: Diagnosis not present

## 2016-04-28 DIAGNOSIS — R6889 Other general symptoms and signs: Secondary | ICD-10-CM | POA: Diagnosis not present

## 2016-04-29 ENCOUNTER — Ambulatory Visit (HOSPITAL_BASED_OUTPATIENT_CLINIC_OR_DEPARTMENT_OTHER): Payer: Managed Care, Other (non HMO)

## 2016-04-29 DIAGNOSIS — Z95828 Presence of other vascular implants and grafts: Secondary | ICD-10-CM

## 2016-04-29 DIAGNOSIS — C154 Malignant neoplasm of middle third of esophagus: Secondary | ICD-10-CM

## 2016-04-29 DIAGNOSIS — Z452 Encounter for adjustment and management of vascular access device: Secondary | ICD-10-CM

## 2016-04-29 MED ORDER — HEPARIN SOD (PORK) LOCK FLUSH 100 UNIT/ML IV SOLN
500.0000 [IU] | Freq: Once | INTRAVENOUS | Status: AC | PRN
  Administered 2016-04-29: 500 [IU] via INTRAVENOUS
  Filled 2016-04-29: qty 5

## 2016-04-29 MED ORDER — SODIUM CHLORIDE 0.9 % IJ SOLN
10.0000 mL | INTRAMUSCULAR | Status: DC | PRN
  Administered 2016-04-29: 10 mL via INTRAVENOUS
  Filled 2016-04-29: qty 10

## 2016-05-20 ENCOUNTER — Other Ambulatory Visit: Payer: Self-pay

## 2016-05-20 ENCOUNTER — Ambulatory Visit (HOSPITAL_BASED_OUTPATIENT_CLINIC_OR_DEPARTMENT_OTHER): Payer: Managed Care, Other (non HMO)

## 2016-05-20 ENCOUNTER — Telehealth: Payer: Self-pay | Admitting: Hematology

## 2016-05-20 ENCOUNTER — Ambulatory Visit: Payer: Self-pay | Admitting: Hematology

## 2016-05-20 DIAGNOSIS — Z452 Encounter for adjustment and management of vascular access device: Secondary | ICD-10-CM

## 2016-05-20 DIAGNOSIS — C154 Malignant neoplasm of middle third of esophagus: Secondary | ICD-10-CM | POA: Diagnosis not present

## 2016-05-20 DIAGNOSIS — Z95828 Presence of other vascular implants and grafts: Secondary | ICD-10-CM

## 2016-05-20 LAB — CBC WITH DIFFERENTIAL/PLATELET
BASO%: 0.4 % (ref 0.0–2.0)
Basophils Absolute: 0 10*3/uL (ref 0.0–0.1)
EOS%: 4.3 % (ref 0.0–7.0)
Eosinophils Absolute: 0.1 10*3/uL (ref 0.0–0.5)
HCT: 36.3 % — ABNORMAL LOW (ref 38.4–49.9)
HGB: 11.5 g/dL — ABNORMAL LOW (ref 13.0–17.1)
LYMPH%: 24.4 % (ref 14.0–49.0)
MCH: 23.6 pg — ABNORMAL LOW (ref 27.2–33.4)
MCHC: 31.7 g/dL — ABNORMAL LOW (ref 32.0–36.0)
MCV: 74.4 fL — ABNORMAL LOW (ref 79.3–98.0)
MONO#: 0.4 10*3/uL (ref 0.1–0.9)
MONO%: 13.8 % (ref 0.0–14.0)
NEUT#: 1.5 10*3/uL (ref 1.5–6.5)
NEUT%: 57.1 % (ref 39.0–75.0)
Platelets: 186 10*3/uL (ref 140–400)
RBC: 4.88 10*6/uL (ref 4.20–5.82)
RDW: 15.2 % — ABNORMAL HIGH (ref 11.0–14.6)
WBC: 2.5 10*3/uL — ABNORMAL LOW (ref 4.0–10.3)
lymph#: 0.6 10*3/uL — ABNORMAL LOW (ref 0.9–3.3)

## 2016-05-20 LAB — COMPREHENSIVE METABOLIC PANEL
ALT: 21 U/L (ref 0–55)
AST: 17 U/L (ref 5–34)
Albumin: 3.7 g/dL (ref 3.5–5.0)
Alkaline Phosphatase: 81 U/L (ref 40–150)
Anion Gap: 9 mEq/L (ref 3–11)
BUN: 9.7 mg/dL (ref 7.0–26.0)
CO2: 25 mEq/L (ref 22–29)
Calcium: 9.4 mg/dL (ref 8.4–10.4)
Chloride: 105 mEq/L (ref 98–109)
Creatinine: 0.9 mg/dL (ref 0.7–1.3)
EGFR: >90 mL/min/{1.73_m2} (ref >90)
Glucose: 111 mg/dl (ref 70–140)
Potassium: 3.8 mEq/L (ref 3.5–5.1)
Sodium: 139 mEq/L (ref 136–145)
Total Bilirubin: 0.7 mg/dL (ref 0.20–1.20)
Total Protein: 7.1 g/dL (ref 6.4–8.3)

## 2016-05-20 MED ORDER — SODIUM CHLORIDE 0.9 % IJ SOLN
10.0000 mL | INTRAMUSCULAR | Status: DC | PRN
  Administered 2016-05-20: 10 mL via INTRAVENOUS
  Filled 2016-05-20: qty 10

## 2016-05-20 MED ORDER — HEPARIN SOD (PORK) LOCK FLUSH 100 UNIT/ML IV SOLN
500.0000 [IU] | Freq: Once | INTRAVENOUS | Status: AC | PRN
  Administered 2016-05-20: 500 [IU] via INTRAVENOUS
  Filled 2016-05-20: qty 5

## 2016-05-20 NOTE — Telephone Encounter (Signed)
R/s and confirmed appt to 3/2 at 0930 per LOS

## 2016-05-21 ENCOUNTER — Other Ambulatory Visit: Payer: Self-pay | Admitting: *Deleted

## 2016-05-21 DIAGNOSIS — C154 Malignant neoplasm of middle third of esophagus: Secondary | ICD-10-CM

## 2016-05-22 ENCOUNTER — Other Ambulatory Visit: Payer: Self-pay | Admitting: Hematology

## 2016-05-22 ENCOUNTER — Encounter (HOSPITAL_COMMUNITY): Payer: Managed Care, Other (non HMO)

## 2016-05-27 ENCOUNTER — Telehealth: Payer: Self-pay | Admitting: *Deleted

## 2016-05-27 NOTE — Telephone Encounter (Signed)
We are waiting for his CT scans to be approved. Elmyra Ricks, please let us know the status.   Melissa, could you schedule him to see me after his CT scan is scheduled and done. Thanks.   [name] MD

## 2016-05-27 NOTE — Telephone Encounter (Signed)
Appointments for 3/2 cancelled. Scheduling message sent to call patient to reschedule per message below.

## 2016-05-27 NOTE — Telephone Encounter (Signed)
Patient appointment for PET cancelled due to insurance. PET Scan is being resubmitted for approval. Would you like appointment to cancelled on 05/29/16 until after PET Scan is done.

## 2016-05-28 ENCOUNTER — Telehealth: Payer: Self-pay | Admitting: Hematology

## 2016-05-28 NOTE — Telephone Encounter (Signed)
Pt called to confirm MD appts. Gave pt appt date/time 3/21 at 1015 am per LOS

## 2016-05-29 ENCOUNTER — Other Ambulatory Visit: Payer: Self-pay

## 2016-05-29 ENCOUNTER — Encounter: Payer: Self-pay | Admitting: Hematology

## 2016-05-29 ENCOUNTER — Ambulatory Visit: Payer: Self-pay | Admitting: Hematology

## 2016-06-01 ENCOUNTER — Encounter (HOSPITAL_COMMUNITY): Payer: Self-pay

## 2016-06-01 ENCOUNTER — Telehealth: Payer: Self-pay | Admitting: Hematology

## 2016-06-01 ENCOUNTER — Ambulatory Visit (HOSPITAL_COMMUNITY)
Admission: RE | Admit: 2016-06-01 | Discharge: 2016-06-01 | Disposition: A | Discharge status: Home / Self Care | Payer: Managed Care, Other (non HMO) | Source: Ambulatory Visit | Attending: Hematology | Admitting: Hematology

## 2016-06-01 ENCOUNTER — Other Ambulatory Visit (HOSPITAL_BASED_OUTPATIENT_CLINIC_OR_DEPARTMENT_OTHER): Payer: Managed Care, Other (non HMO)

## 2016-06-01 DIAGNOSIS — C154 Malignant neoplasm of middle third of esophagus: Secondary | ICD-10-CM | POA: Diagnosis present

## 2016-06-01 DIAGNOSIS — Z9889 Other specified postprocedural states: Secondary | ICD-10-CM | POA: Insufficient documentation

## 2016-06-01 DIAGNOSIS — R918 Other nonspecific abnormal finding of lung field: Secondary | ICD-10-CM | POA: Diagnosis not present

## 2016-06-01 LAB — CBC WITH DIFFERENTIAL/PLATELET
BASO%: 0.5 % (ref 0.0–2.0)
Basophils Absolute: 0 10*3/uL (ref 0.0–0.1)
EOS%: 1.8 % (ref 0.0–7.0)
Eosinophils Absolute: 0.1 10*3/uL (ref 0.0–0.5)
HCT: 35.8 % — ABNORMAL LOW (ref 38.4–49.9)
HGB: 11.3 g/dL — ABNORMAL LOW (ref 13.0–17.1)
LYMPH%: 20.9 % (ref 14.0–49.0)
MCH: 23.3 pg — ABNORMAL LOW (ref 27.2–33.4)
MCHC: 31.6 g/dL — ABNORMAL LOW (ref 32.0–36.0)
MCV: 74 fL — ABNORMAL LOW (ref 79.3–98.0)
MONO#: 0.4 10*3/uL (ref 0.1–0.9)
MONO%: 9.3 % (ref 0.0–14.0)
NEUT#: 2.7 10*3/uL (ref 1.5–6.5)
NEUT%: 67.5 % (ref 39.0–75.0)
Platelets: 202 10*3/uL (ref 140–400)
RBC: 4.84 10*6/uL (ref 4.20–5.82)
RDW: 15.2 % — ABNORMAL HIGH (ref 11.0–14.6)
WBC: 4 10*3/uL (ref 4.0–10.3)
lymph#: 0.8 10*3/uL — ABNORMAL LOW (ref 0.9–3.3)

## 2016-06-01 LAB — COMPREHENSIVE METABOLIC PANEL
ALT: 25 U/L (ref 0–55)
AST: 20 U/L (ref 5–34)
Albumin: 3.8 g/dL (ref 3.5–5.0)
Alkaline Phosphatase: 75 U/L (ref 40–150)
Anion Gap: 8 mEq/L (ref 3–11)
BUN: 8.1 mg/dL (ref 7.0–26.0)
CO2: 27 mEq/L (ref 22–29)
Calcium: 9.4 mg/dL (ref 8.4–10.4)
Chloride: 106 mEq/L (ref 98–109)
Creatinine: 1 mg/dL (ref 0.7–1.3)
EGFR: 84 mL/min/{1.73_m2} — ABNORMAL LOW (ref >90)
Glucose: 103 mg/dl (ref 70–140)
Potassium: 4.1 mEq/L (ref 3.5–5.1)
Sodium: 141 mEq/L (ref 136–145)
Total Bilirubin: 0.55 mg/dL (ref 0.20–1.20)
Total Protein: 7 g/dL (ref 6.4–8.3)

## 2016-06-01 MED ORDER — IOPAMIDOL (ISOVUE-300) INJECTION 61%
100.0000 mL | Freq: Once | INTRAVENOUS | Status: AC | PRN
  Administered 2016-06-01: 100 mL via INTRAVENOUS

## 2016-06-01 MED ORDER — IOPAMIDOL (ISOVUE-300) INJECTION 61%
INTRAVENOUS | Status: AC
  Filled 2016-06-01: qty 100

## 2016-06-01 NOTE — Progress Notes (Signed)
This encounter was created in error - please disregard.

## 2016-06-01 NOTE — Telephone Encounter (Signed)
Spoke with wife this morning re lab today at 12:15 pm prior to ct at [facility]. Wife also given 3/8 f/u. Appointments scheduled per staff message from [place].

## 2016-06-03 DIAGNOSIS — R6889 Other general symptoms and signs: Secondary | ICD-10-CM | POA: Diagnosis not present

## 2016-06-03 NOTE — Progress Notes (Signed)
[place]  [phone] [phone]  Clinic follow Up Note   Patient Care Team: Rama (Merrilee Seashore) [name], MD (Inactive) as PCP - General (Internal Medicine) [name], MD as Consulting Physician (Hematology) Kyung Rudd, MD as Consulting Physician (Radiation Oncology) Tania Ade, RN as Registered Nurse Juanita Craver, MD as Consulting Physician (Gastroenterology) 06/04/2016  CHIEF COMPLAINTS:  Follow up esophageal squamous cell carcinoma  Oncology History   Presented with dysphagia and odynophagia with 20-25 pounds of weight loss in about 3-4 months  Esophageal cancer ([place])   Staging form: Esophagus - Adenocarcinoma, AJCC 7th Edition   - Clinical stage from 11/13/2015: Stage IIIB (T3, N2, M0, G2) - Signed by [name], MD on 12/11/2015      Esophageal cancer ([place])   11/13/2015 Procedure    UPPER ENDOSCOPY per Dr. Collene Mares: Large fungating, friable bleeding mass in middle third of esophagus, 22cm from incisors and extended to 27cm. Non-obstructing.      11/13/2015 Pathology Results    Invasive squamous cell carcinoma; moderately differentiated      11/13/2015 Imaging    CT ABD/PELVIS: IMPRESSION:No evidence of metastatic disease in the abdomen or pelvis. 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 Aortoiliac atherosclerosis. Small bilateral inguinal hernias containing fat the      11/22/2015 Initial Diagnosis    Esophageal cancer ([place])     12/03/2015 Imaging    PET scan showed a long segment of hypermetabolic thickening in the mid esophagus consistent with esophageal carcinoma. No clear evidence of node metastasis or distant metastasis.      12/10/2015 - 01/14/2016 Radiation Therapy    Neoadjuvant radiation to his esophageal cancer      12/10/2015 - 01/15/2016 Chemotherapy    Neoadjuvant weekly carboplatin AUC 2, and Taxol 45 mg/m, with concurrent radiation. He developed steroid-induced psychosis, and Taxol was changed to Abraxane to avoid  premedication with steroids.      02/24/2016 Imaging    CT CAP with contrast showed interval improvement mid esophageal mass, no evidence for metastatic adenopathy or distant metastasis.       03/10/2016 Procedure    Repeat EGD by Dr. Benson Norway showed wall thickening in the thoracic esophagus with the tumor was. The thickness decreased from 12.8 mm to 6-7 mm. Not able to differentiate between actual tumor versus fibrosis from radiation. Some peritumoral shoddy lymph nodes.      04/07/2016 Surgery    Patient followed up with Dr. Servando Snare, patient is not a great candidate for surgery, pt also does not want surgery, mutually agreed to not pursue esophagectomy.       06/01/2016 Imaging    CT C/A/P IMPRESSION: Mild mid esophageal wall thickening without residual mass on CT. Radiation changes in the paramediastinal lung bilaterally. No evidence of recurrent or metastatic disease.       HISTORY OF PRESENTING ILLNESS:  Juan [name] 76 y.o. male is here because of His recently diagnosed esophageal cancer. He is accompanied by his wife to my clinic today.  He started having sore throat and dysphagia about 4-5 months ago, and was seen by PCP, had 2 course antibiotics but is symptom progressed. He was referred to gastroenterologist Dr. Suezanne Cheshire, and underwent EGD on 11/13/2015, which showed a large fungating, friable bleeding mass in the middle third of esophagus, 22 cm to 27 cm from incisors, non-obstructing. The biopsy showed squamous cell carcinoma. His CT abdomen and pelvis was negative for metastatic disease.  He has lost 20lbs, he is on soft diet  and liquids, his pain is about 7/10 pain with swallowing. He also has chest pain , intermittent, it lasts about a few minutes, no nausea or vomiting, his bowel movement is normal. He denies melena or hematochezia. He is able to function and tolerated light activities at home.  CURRENT THERAPY: Observation  INTERIM HISTORY: [name] returns for follow-up. He  is accompanied by his wife. The patient reports he is well. He denies difficulty swallowing most foods, though there are some "hard" foods he cannot swallow. He denies dysphagia. He reports occasional pain to the center of his chest, which he thinks may be heartburn. He denies diarrhea.   MEDICAL HISTORY:  Past Medical History:  Diagnosis Date  . Diabetes mellitus without complication ([place])   . Esophageal cancer ([place])   . LBBB (left bundle branch block) 02/11/2016  . Reflux     SURGICAL HISTORY: Past Surgical History:  Procedure Laterality Date  . EUS N/A 12/05/2015   Procedure: UPPER ENDOSCOPIC ULTRASOUND (EUS) LINEAR;  Surgeon: Carol Ada, MD;  Location: [facility]COPY;  Service: Endoscopy;  Laterality: N/A;  . EUS N/A 03/10/2016   Procedure: UPPER ENDOSCOPIC ULTRASOUND (EUS) LINEAR;  Surgeon: Carol Ada, MD;  Location: [facility] ENDOSCOPY;  Service: Endoscopy;  Laterality: N/A;  . HERNIA REPAIR    . IR GENERIC HISTORICAL  12/24/2015   IR FLUORO GUIDE PORT INSERTION RIGHT 12/24/2015 Arne Cleveland, MD [facility]  . IR GENERIC HISTORICAL  12/24/2015   IR US GUIDE VASC ACCESS RIGHT 12/24/2015 Arne Cleveland, MD [facility]  . SHOULDER ARTHROSCOPY W/ SUPERIOR LABRAL ANTERIOR POSTERIOR LESION REPAIR      SOCIAL HISTORY: Social History   Social History  . Marital status: Married    Spouse name: N/A  . Number of children: N/A  . Years of education: N/A   Occupational History  . Not on file.   Social History Main Topics  . Smoking status: Former Smoker    Packs/day: 2.00    Years: 40.00    Quit date: 03/31/1999  . Smokeless tobacco: Never Used  . Alcohol use No     Comment: weekend binge drinking for 30-40 years, quit in 2001  . Drug use: No  . Sexual activity: Not on file   Other Topics Concern  . 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 Colon cancer Father 12  . Colon cancer Brother 13  . Heart attack Brother   . Heart disease Mother   . Heart attack Mother   . Diabetes Sister   . Stroke Sister     ALLERGIES:  has No Known Allergies.  MEDICATIONS:  Current Outpatient Prescriptions  Medication Sig Dispense Refill  . alfuzosin (UROXATRAL) 10 MG 24 hr tablet Take 10 mg by mouth daily with breakfast.    . amitriptyline (ELAVIL) 10 MG tablet Take 10 mg by mouth at bedtime.     Marland Kitchen aspirin EC 81 MG tablet Take 81 mg by mouth daily.    Marland Kitchen atorvastatin (LIPITOR) 20 MG tablet Take 20 mg by mouth at bedtime.     Marland Kitchen oxyCODONE (ROXICODONE) 15 MG immediate release tablet Take 1 tablet (15 mg total) by mouth every 6 (six) hours as needed for pain.  30 tablet 0  . pantoprazole (PROTONIX) 40 MG tablet Take 1 tablet (40 mg total) by mouth daily. 30 tablet 0  . predniSONE (DELTASONE) 20 MG tablet Two daily with food 10 tablet 0  . prochlorperazine (COMPAZINE) 10 MG tablet Take 1 tablet (10 mg total) by mouth every 6 (six) hours as needed (Nausea or vomiting). 30 tablet 1  . sodium chloride (OCEAN) 0.65 % SOLN nasal spray Place 1 spray into both nostrils as needed for congestion. 30 mL 2  . sucralfate (CARAFATE) 1 g tablet Take 1 tablet (1 g total) by mouth 4 (four) times daily. 360 tablet 0   No current facility-administered medications for this visit.     REVIEW OF SYSTEMS:   Constitutional: Denies fevers, chills or abnormal night sweats (+) fatigue, weakness Eyes: Denies blurriness of vision, double vision or watery eyes Ears, nose, mouth, throat, and face: Denies mucositis or dysphagia (+) occasional sore throat Respiratory: Denies cough, dyspnea or wheezes Cardiovascular: Denies palpitation, chest discomfort or lower extremity swelling Gastrointestinal:  Denies nausea or change in bowel habits (+) heartburn Skin: Denies  abnormal skin rashes Musculoskeletal: (+) back pain, mostly left sided and extending into the left leg Lymphatics: Denies new lymphadenopathy or easy bruising Neurological:Denies numbness, tingling or new weaknesses Behavioral/Psych: Mood is stable, no new changes (+) insomnia secondary to back pain All other systems were reviewed with the patient and are negative.  PHYSICAL EXAMINATION: ECOG PERFORMANCE STATUS: 1 - Symptomatic but completely ambulatory  Vitals:   06/04/16 1246  BP: 120/86  Pulse: 73  Resp: 18  Temp: 98.6 F (37 C)   Filed Weights   06/04/16 1246  Weight: 197 lb 8 oz (89.6 kg)    GENERAL:alert, no distress and comfortable SKIN: skin color, texture, turgor are normal, no rashes or significant lesions EYES: normal, conjunctiva are pink and non-injected, sclera clear OROPHARYNX:no exudate, no erythema and lips, buccal mucosa, and tongue normal  NECK: supple, thyroid normal size, non-tender, without nodularity LYMPH:  no palpable lymphadenopathy in the cervical, axillary or inguinal LUNGS: clear to auscultation and percussion with normal breathing effort HEART: regular rate & rhythm and no murmurs and no lower extremity edema ABDOMEN:abdomen soft, non-tender and normal bowel sounds Musculoskeletal:no cyanosis of digits and no clubbing  PSYCH: alert & oriented x 3 with fluent speech NEURO: no focal motor/sensory deficits  LABORATORY DATA:  I have reviewed the data as listed CBC Latest Ref Rng & Units 06/01/2016 05/20/2016 04/22/2016  WBC 4.0 - 10.3 10e3/uL 4.0 2.5(L) 3.3(L)  Hemoglobin 13.0 - 17.1 g/dL 11.3(L) 11.5(L) 11.2(L)  Hematocrit 38.4 - 49.9 % 35.8(L) 36.3(L) 36.1(L)  Platelets 140 - 400 10e3/uL 202 186 247   CMP Latest Ref Rng & Units 06/01/2016 05/20/2016 04/22/2016  Glucose 70 - 140 mg/dl 103 111 108  BUN 7.0 - 26.0 mg/dL 8.1 9.7 8.0  Creatinine 0.7 - 1.3 mg/dL 1.0 0.9 1.0  Sodium 136 - 145 mEq/L 141 139 143  Potassium 3.5 - 5.1 mEq/L 4.1 3.8 4.3    Chloride 101 - 111 mmol/L - - -  CO2 22 - 29 mEq/L 27 25 25   Calcium 8.4 - 10.4 mg/dL 9.4 9.4 9.7  Total Protein 6.4 - 8.3 g/dL 7.0 7.1 7.5  Total Bilirubin 0.20 - 1.20 mg/dL 0.55 0.70 0.64  Alkaline Phos 40 - 150 U/L 75 81 90  AST 5 - 34 U/L 20 17 13   ALT 0 - 55 U/L 25 21 11     PATHOLOGY REPORT  Final  microscopic diagnosis Esophagus, 0.2 cm to 27 cm, biopsy -Invasive squamous cell carcinoma, moderately differentiated   RADIOGRAPHIC STUDIES: I have personally reviewed the radiological images as listed and agreed with the findings in the report. Ct Chest W Contrast  Result Date: 06/01/2016 CLINICAL DATA:  Restaging distal esophageal cancer, diagnosed 10/2015, chemotherapy and XRT complete EXAM: CT CHEST, ABDOMEN, AND PELVIS WITH CONTRAST TECHNIQUE: Multidetector CT imaging of the chest, abdomen and pelvis was performed following the standard protocol during bolus administration of intravenous contrast. CONTRAST:  15mL ISOVUE-300 IOPAMIDOL (ISOVUE-300) INJECTION 61% COMPARISON:  CT chest abdomen pelvis dated 02/24/2016. PET-CT dated 12/03/2015. FINDINGS: CT CHEST FINDINGS Cardiovascular: The heart is top-normal in size. No pericardial effusion. Mild coronary atherosclerosis in the LAD. No evidence of thoracic aortic aneurysm. Right chest port terminates in the lower SVC. Mediastinum/Nodes: No suspicious mediastinal lymphadenopathy. Mid/distal esophagus is mildly thick walled (series 2/image 27) without residual mass. Visualized thyroid is unremarkable. Lungs/Pleura: Mild paramediastinal radiation changes bilaterally. Mild centrilobular emphysematous changes. No focal consolidation. No suspicious pulmonary nodules. No pleural effusion or pneumothorax. Musculoskeletal: Degenerative changes of the thoracic spine. CT ABDOMEN PELVIS FINDINGS Hepatobiliary: Liver is within normal limits. Gallbladder is unremarkable. No intrahepatic or extrahepatic ductal dilatation. Pancreas: Within normal limits.  Spleen: Calcified splenic granulomata. Adrenals/Urinary Tract: Adrenal glands are within normal limits. 1.6 cm left upper pole renal sinus cyst (series 7/ image 17). Right kidney is within normal limits. No hydronephrosis. Bladder is mildly thick-walled although underdistended. Stomach/Bowel: Stomach is within normal limits. No evidence of bowel obstruction. Normal appendix (series 2/ image 85). Sigmoid diverticulosis, without evidence of diverticulitis. Left colon is decompressed. Vascular/Lymphatic: No evidence of abdominal aortic aneurysm. Atherosclerotic calcifications of the abdominal aorta and branch vessels. No suspicious abdominopelvic lymphadenopathy. Reproductive: Prostate is unremarkable. Other: No abdominopelvic ascites. Musculoskeletal: Degenerative changes of the lumbar spine. IMPRESSION: Mild mid esophageal wall thickening without residual mass on CT. Radiation changes in the paramediastinal lung bilaterally. No evidence of recurrent or metastatic disease. Electronically Signed   By: Julian Hy M.D.   On: 06/01/2016 15:24   Ct Abdomen Pelvis W Contrast  Result Date: 06/01/2016 CLINICAL DATA:  Restaging distal esophageal cancer, diagnosed 10/2015, chemotherapy and XRT complete EXAM: CT CHEST, ABDOMEN, AND PELVIS WITH CONTRAST TECHNIQUE: Multidetector CT imaging of the chest, abdomen and pelvis was performed following the standard protocol during bolus administration of intravenous contrast. CONTRAST:  167mL ISOVUE-300 IOPAMIDOL (ISOVUE-300) INJECTION 61% COMPARISON:  CT chest abdomen pelvis dated 02/24/2016. PET-CT dated 12/03/2015. FINDINGS: CT CHEST FINDINGS Cardiovascular: The heart is top-normal in size. No pericardial effusion. Mild coronary atherosclerosis in the LAD. No evidence of thoracic aortic aneurysm. Right chest port terminates in the lower SVC. Mediastinum/Nodes: No suspicious mediastinal lymphadenopathy. Mid/distal esophagus is mildly thick walled (series 2/image 27) without  residual mass. Visualized thyroid is unremarkable. Lungs/Pleura: Mild paramediastinal radiation changes bilaterally. Mild centrilobular emphysematous changes. No focal consolidation. No suspicious pulmonary nodules. No pleural effusion or pneumothorax. Musculoskeletal: Degenerative changes of the thoracic spine. CT ABDOMEN PELVIS FINDINGS Hepatobiliary: Liver is within normal limits. Gallbladder is unremarkable. No intrahepatic or extrahepatic ductal dilatation. Pancreas: Within normal limits. Spleen: Calcified splenic granulomata. Adrenals/Urinary Tract: Adrenal glands are within normal limits. 1.6 cm left upper pole renal sinus cyst (series 7/ image 17). Right kidney is within normal limits. No hydronephrosis. Bladder is mildly thick-walled although underdistended. Stomach/Bowel: Stomach is within normal limits. No evidence of bowel obstruction. Normal appendix (series 2/ image 85). Sigmoid diverticulosis, without evidence of diverticulitis. Left colon is decompressed. Vascular/Lymphatic:  No evidence of abdominal aortic aneurysm. Atherosclerotic calcifications of the abdominal aorta and branch vessels. No suspicious abdominopelvic lymphadenopathy. Reproductive: Prostate is unremarkable. Other: No abdominopelvic ascites. Musculoskeletal: Degenerative changes of the lumbar spine. IMPRESSION: Mild mid esophageal wall thickening without residual mass on CT. Radiation changes in the paramediastinal lung bilaterally. No evidence of recurrent or metastatic disease. Electronically Signed   By: Julian Hy M.D.   On: 06/01/2016 15:24   EGD 11/13/2015 Dr. Collene Mares  -A large, fungating, friable mass with bleeding was found in the mid third of the esophagus, 22 cm from the incisors and extended to 27 cm. The mass was nonobstructing and a circumferential, biopsy was taken. -Normal stomach -Normal proximal small bowel  Colonoscopy 11/13/2015 Dr. Collene Mares -Diverticulosis in the entire exam: -No specimens collected  EUS  12/05/2015 Malignant esophageal tumor was found in the upper third of the esophagus and in the middle third of the esophagus. - A mass was found in the cervical esophagus and in the thoracic esophagus. The diagnosis is squamous cell carcinoma. This was staged T3 N1/N2 Mx by endosonographic criteria. - No specimens collected.  EUS 03/10/2016 DR. HUNG  the esohpagus was thickened starting around 23 cm and it extended down in to the lower esophagus. Where the tumor was the thickest, prior to treatment, there was a marked reduction in volume. The thickness decreased from 12.8 mm down to 6-7.7 mm. Again, restaging post treatment is not the most reliable, however, it appears to be a T3. I cannot differentiate between actual tumor versus fibrosis/inflammation. Some peritumoral shoddy lymph nodes were noted. IMPRESSION:  - Benign-appearing esophageal stenosis. - Normal stomach. - Normal examined duodenum. - Wall thickening was seen in the thoracic esophagus. - No specimens collected.    ASSESSMENT & PLAN: 76 year old gentleman, without significant past medical history except acid reflux, presented with progressive dysphagia and odynophagia and weight loss.  1. Esophageal cancer, squamous cell carcinoma, cT3N1-2M0, stage IIIB -I previously reviewed his EGD findings, biopsy results, and the CT scan findings with patient and his wife in great details -I previously dicussed staging PET scan findings with patient and his wife, the mid esophageal mass is hypermetabolic, no distant metastasis. -EUS revealed a T3 lesion, N1 vs N2, locally advanced disease. 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 Servando Snare has mutually agreed not to pursue esophagectomy due to his advanced age and general health condition -We previously discussed that his esophageal cancer is unlikely  cured by chemoradiation. I reviewed his repeated EUS after his chemotherapy and irradiation, which showed good response to chemoradiation, but possible residual tumor. -I reviewed his recent recent CT CAP on 06/01/2016 which showed no evidence of recurrent or metastatic disease. Repeat scans in 6 months. -We discussed repeat endoscopy. The patient is in agreement, so I will refer him to Dr. Benson Norway for this.  2. Dysphagia, resolved  -His dysphagia has mostly resolved after chemotherapy and irradiation -follow up with dietician Pamala Hurry as needed -He has started gaining some weight back  3. Steroids induced psychosis -Resolved now. We'll avoid steroids in the future.  4. Leukopenia and anemia -Secondary to chemotherapy radiation -Leukopenia has resolved, he still has mild anemia, overall stable. -We'll continue monitoring, no need for blood transfusion  5. Chronic back pain -I have encouraged the patient to follow with primary care physician for back pain -I will not refill his narcotics.  Recommendations -Labs and  CT chest reviewed today. -Repeat endoscopy with Dr. Benson Norway. I will send my findings to Dr. Servando Snare also.  -F/U in 3 months. -Repeat scans in 6 months if clinically doing well  -He will see Dr. Servando Snare again on 07/02/2016.   All questions were answered. The patient knows to call the clinic with any problems, questions or concerns.  I spent 20 minutes counseling the patient face to face. The total time spent in the appointment was 25 minutes and more than 50% was on counseling.  This document serves as a record of services personally performed by [name], MD. It was created on her behalf by Maryla Morrow, a trained medical scribe. The creation of this record is based on the scribe's personal observations and the provider's statements to them. This document has been checked and approved by the attending provider.   [name], MD 06/06/2016

## 2016-06-04 ENCOUNTER — Ambulatory Visit (HOSPITAL_BASED_OUTPATIENT_CLINIC_OR_DEPARTMENT_OTHER): Payer: Managed Care, Other (non HMO) | Admitting: Hematology

## 2016-06-04 ENCOUNTER — Encounter: Payer: Self-pay | Admitting: Hematology

## 2016-06-04 ENCOUNTER — Telehealth: Payer: Self-pay | Admitting: Hematology

## 2016-06-04 VITALS — BP 120/86 | HR 73 | Temp 98.6°F | Resp 18 | Ht 69.0 in | Wt 197.5 lb

## 2016-06-04 DIAGNOSIS — G8929 Other chronic pain: Secondary | ICD-10-CM

## 2016-06-04 DIAGNOSIS — D6481 Anemia due to antineoplastic chemotherapy: Secondary | ICD-10-CM | POA: Diagnosis not present

## 2016-06-04 DIAGNOSIS — M549 Dorsalgia, unspecified: Secondary | ICD-10-CM

## 2016-06-04 DIAGNOSIS — C154 Malignant neoplasm of middle third of esophagus: Secondary | ICD-10-CM

## 2016-06-04 DIAGNOSIS — D701 Agranulocytosis secondary to cancer chemotherapy: Secondary | ICD-10-CM | POA: Diagnosis not present

## 2016-06-04 NOTE — Telephone Encounter (Signed)
Appointments scheduled per 06/04/16 los. Patient was given a copy of the AVS rport and appointment schedule, per 06/04/16 los. °

## 2016-06-06 ENCOUNTER — Encounter: Payer: Self-pay | Admitting: Hematology

## 2016-06-09 ENCOUNTER — Telehealth: Payer: Self-pay | Admitting: *Deleted

## 2016-06-09 ENCOUNTER — Other Ambulatory Visit: Payer: Self-pay | Admitting: Radiation Oncology

## 2016-06-09 DIAGNOSIS — C154 Malignant neoplasm of middle third of esophagus: Secondary | ICD-10-CM

## 2016-06-09 NOTE — Telephone Encounter (Signed)
Wife called requesting refill on patient's carafate, his radiation treatments  Was completed 01/14/16 esophagus,  per Dr. Lewayne Bunting note on 06/04/16 :  2. Dysphagia, resolved  -His dysphagia has mostly resolved after chemotherapy and irradiation -follow up with dietician Pamala Hurry as needed -He has started gaining some weight back Recommendations -Labs and CT chest reviewed today. -Repeat endoscopy with Dr. Benson Norway. I will send my findings to Dr. Servando Snare also.  -F/U in 3 months. -Repeat scans in 6 months if clinically doing well  -He will see Dr. Servando Snare again on 07/02/2016.  Wife aware MD and Bryson Ha is out of the office today and will return tomorrow, she will waitto hear from them tomorrow about the refill of Carafate, informed her I would in absket [employer] 10:20 AM

## 2016-06-10 ENCOUNTER — Telehealth: Payer: Self-pay | Admitting: *Deleted

## 2016-06-10 ENCOUNTER — Encounter: Payer: Self-pay | Admitting: *Deleted

## 2016-06-10 NOTE — Telephone Encounter (Signed)
Called [facility]= spoke with Caryl Pina, pharm tech,  To give refill on sucralfate/carafate : take 1 gram tablet 4x day, oral, take 1 tab 30 minutes before meals and again at bedtime, dissolve tablet in 1-2 tablespoons water, dispense 260 tablets Read back by Caryl Pina 8:54 AM

## 2016-06-10 NOTE — Telephone Encounter (Signed)
Called [facility] [phone] refill carafat4 x day dissolve in 1-e 1g, oral take

## 2016-06-17 ENCOUNTER — Other Ambulatory Visit: Payer: Self-pay

## 2016-06-17 ENCOUNTER — Telehealth: Payer: Self-pay | Admitting: *Deleted

## 2016-06-17 ENCOUNTER — Telehealth: Payer: Self-pay | Admitting: Hematology

## 2016-06-17 ENCOUNTER — Encounter: Payer: Self-pay | Admitting: Hematology

## 2016-06-17 NOTE — Telephone Encounter (Signed)
Spoke with patients wife, Juan [name] regaurding 06/17/16 appts, [name] states "we didn't know about those".  According to Dr. Ernestina Penna note pt needed to see back in office in June, [name] with Mignon Pine RN, pt will need port flushed first week of April and every 6 weeks. Message sent to schedulers for appts with correct numbers.  Patient's cell [phone] Patient's home: [place] Cell: [phone].  Numbers to be updated.

## 2016-06-17 NOTE — Telephone Encounter (Signed)
sw pt to confirm flush appt 4/5 at 12 per LOS

## 2016-06-18 NOTE — Progress Notes (Signed)
This encounter was created in error - please disregard.

## 2016-07-02 ENCOUNTER — Ambulatory Visit (INDEPENDENT_AMBULATORY_CARE_PROVIDER_SITE_OTHER): Payer: Medicare HMO | Admitting: Cardiothoracic Surgery

## 2016-07-02 ENCOUNTER — Ambulatory Visit (HOSPITAL_BASED_OUTPATIENT_CLINIC_OR_DEPARTMENT_OTHER): Payer: Medicare HMO

## 2016-07-02 ENCOUNTER — Encounter: Payer: Self-pay | Admitting: Cardiothoracic Surgery

## 2016-07-02 VITALS — BP 143/85 | HR 64 | Resp 16 | Ht 69.0 in | Wt 196.0 lb

## 2016-07-02 VITALS — BP 136/78 | HR 62 | Temp 97.7°F | Resp 16

## 2016-07-02 DIAGNOSIS — Z923 Personal history of irradiation: Secondary | ICD-10-CM

## 2016-07-02 DIAGNOSIS — Z452 Encounter for adjustment and management of vascular access device: Secondary | ICD-10-CM

## 2016-07-02 DIAGNOSIS — Z9221 Personal history of antineoplastic chemotherapy: Secondary | ICD-10-CM | POA: Diagnosis not present

## 2016-07-02 DIAGNOSIS — C154 Malignant neoplasm of middle third of esophagus: Secondary | ICD-10-CM | POA: Diagnosis not present

## 2016-07-02 DIAGNOSIS — C159 Malignant neoplasm of esophagus, unspecified: Secondary | ICD-10-CM

## 2016-07-02 DIAGNOSIS — Z95828 Presence of other vascular implants and grafts: Secondary | ICD-10-CM

## 2016-07-02 MED ORDER — HEPARIN SOD (PORK) LOCK FLUSH 100 UNIT/ML IV SOLN
500.0000 [IU] | Freq: Once | INTRAVENOUS | Status: AC | PRN
  Administered 2016-07-02: 500 [IU] via INTRAVENOUS
  Filled 2016-07-02: qty 5

## 2016-07-02 MED ORDER — SODIUM CHLORIDE 0.9 % IJ SOLN
10.0000 mL | INTRAMUSCULAR | Status: DC | PRN
  Administered 2016-07-02: 10 mL via INTRAVENOUS
  Filled 2016-07-02: qty 10

## 2016-07-02 NOTE — Progress Notes (Signed)
[address]       [place] [address]             [phone]                    Juan [name] Medical Record #[id number] Date of Birth: 05/28/1940  Referring: [name], MD Primary Care: [name]) [name], MD (Inactive)  Chief Complaint:    Chief Complaint  Patient presents with  . Esophageal Cancer    3 month f/u with CT C/A/P 06/01/16  Cancer Staging Esophageal cancer [facility]) Staging form: Esophagus - Adenocarcinoma, AJCC 7th Edition - Clinical stage from 11/13/2015: Stage IIIB (T3, N2, M0, G2) - Signed by [name], MD on 12/11/2015   History of Present Illness:    Juan [name] 76 y.o. male is seen in the office  today for Esophageal cancer-squamous cell -[place] [id number]. The patient notes a "sore throat and painful swallowing for more than a year, associated with weight loss from 196-171. While living in [place] he had been told that he had Barrett's esophagus and had had episodic endoscopies, but I have no reports at this.he saw Dr. Collene Mares for a follow-up colonoscopy, at that time a upper GI endoscopy was performed. A second endoscopy was performed with esophageal ultrasound as noted below :    A mass was found in the cervical esophagus and in the thoracic esophagus. The diagnosis is squamous cell carcinoma. This was staged T3 N1/N2 Mx by endosonographic criteria. From the descriptions of endoscopy is not clear to me if this patient has extensive squamous cell carcinoma involving the cervical and mid thoracic esophagus or 2 separate lesions.    He's completed radiation   therapy on October 18  and chemotherapy  on January 14 2016   At this point his tolerating a soft diet  well, and Gaining weight, up 15 lbs since January .   THERAPY: concurrent chemotherapy and radiation, with weekly carboplatin AUC 2 and Taxol 45 mg/m, started on 12/10/2015 and ended October 17   Current Activity/ Functional Status:  Patient is independent with  mobility/ambulation, transfers, ADL's, IADL's.   Zubrod Score: At the time of surgery this patient's most appropriate activity status/level should be described as: []     0    Normal activity, no symptoms [x]     1    Restricted in physical strenuous activity but ambulatory, able to do out light work []     2    Ambulatory and capable of self care, unable to do work activities, up and about               >50 % of waking hours                              []     3    Only limited self care, in bed greater than 50% of waking hours []     4    Completely disabled, no self care, confined to bed or chair []     5    Moribund   Past Medical History:  Diagnosis Date  . Diabetes mellitus without complication ([place])   . Esophageal cancer ([place])   . LBBB (left bundle branch block) 02/11/2016  . Reflux     Past Surgical History:  Procedure Laterality Date  . EUS N/A 12/05/2015   Procedure: UPPER ENDOSCOPIC ULTRASOUND (EUS) LINEAR;  Surgeon:  Carol Ada, MD;  Location: [facility];  Service: Endoscopy;  Laterality: N/A;  . EUS N/A 03/10/2016   Procedure: UPPER ENDOSCOPIC ULTRASOUND (EUS) LINEAR;  Surgeon: Carol Ada, MD;  Location: [facility];  Service: Endoscopy;  Laterality: N/A;  . HERNIA REPAIR    . IR GENERIC HISTORICAL  12/24/2015   IR FLUORO GUIDE PORT INSERTION RIGHT 12/24/2015 Arne Cleveland, MD [facility]  . IR GENERIC HISTORICAL  12/24/2015   IR US GUIDE VASC ACCESS RIGHT 12/24/2015 Arne Cleveland, MD [facility]  . SHOULDER ARTHROSCOPY W/ SUPERIOR LABRAL ANTERIOR POSTERIOR LESION REPAIR      Family History  Problem Relation Age of Onset  . Colon cancer Father 61  . Colon cancer Brother 84  . Heart attack Brother   . Heart disease Mother   . Heart attack Mother   . Diabetes Sister   . Stroke Sister     Social History   Social History  . Marital status: Married    Spouse name: N/A  . Number of children: N/A  . Years of education: N/A   Occupational History  . Not on  file.   Social History Main Topics  . Smoking status: Former Smoker    Packs/day: 2.00    Years: 40.00    Quit date: 03/31/1999  . Smokeless tobacco: Never Used  . Alcohol use No     Comment: weekend binge drinking for 30-40 years, quit in 2001  . Drug use: No  . Sexual activity: Not on file   Other Topics Concern  . 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 Former Smoker  . Packs/day: 2.00  . Years: 40.00  . Quit date: 03/31/1999  Smokeless Tobacco  . Never Used    History  Alcohol Use No    Comment: weekend binge drinking for 30-40 years, quit in 2001     No Known Allergies  Current Outpatient Prescriptions  Medication Sig Dispense Refill  . alfuzosin (UROXATRAL) 10 MG 24 hr tablet Take 10 mg by mouth daily with breakfast.    . amitriptyline (ELAVIL) 10 MG tablet Take 10 mg by mouth at bedtime.     Marland Kitchen aspirin EC 81 MG tablet Take 81 mg by mouth daily.    Marland Kitchen atorvastatin (LIPITOR) 20 MG tablet Take 20 mg by mouth at bedtime.     Marland Kitchen oxyCODONE (ROXICODONE) 15 MG immediate release tablet Take 1 tablet (15 mg total) by mouth every 6 (six) hours as needed for pain. 30 tablet 0  . pantoprazole (PROTONIX) 40 MG tablet Take 1 tablet (40 mg total) by mouth daily. 30 tablet 0  . predniSONE (DELTASONE) 20 MG tablet Two daily with food 10 tablet 0  . prochlorperazine (COMPAZINE) 10 MG tablet Take 1 tablet (10 mg total) by mouth every 6 (six) hours as needed (Nausea or vomiting). 30 tablet 1  . sodium chloride (OCEAN) 0.65 % SOLN nasal spray Place 1 spray into both nostrils as needed for congestion. 30 mL 2  . sucralfate (CARAFATE) 1 g tablet TAKE 1 TABLET 4 TIMES A DAY 120 tablet 0   No current facility-administered medications for this visit.       Review of Systems:     Cardiac Review of Systems: Y or N  Chest Pain [ y   ]  Resting SOB [  y ] Exertional  SOB  Blue.Reese  ]   Orthopnea Blue.Reese  ]   Pedal Edema [  n ]    Palpitations Florencio.Farrier  ] Syncope  Florencio.Farrier  ]   Presyncope [ y  ]  General Review of Systems: [Y] = yes [  ]=no Constitional: recent weight change Blue.Reese  ];  Wt loss over the last 3 months Blue.Reese   ] anorexia [  ]; fatigue [ y ]; nausea [ y ]; night sweats [  ]; fever [  ]; or chills [  ];          Dental: poor dentition[  ]; Last Dentist visit:   Eye : blurred vision [  ]; diplopia [   ]; vision changes [  ];  Amaurosis fugax[  ]; Resp: cough [  ];  wheezing[  ];  hemoptysis[ n ]; shortness of breath[  ]; paroxysmal nocturnal dyspnea[  ]; dyspnea on exertion[  ]; or orthopnea[  ];  GI:  gallstones[  ], vomiting[  ];  dysphagia[y  ]; melena[ n ];  hematochezia [n  ]; heartburn[  ];   Hx of  Colonoscopy[  ]; GU: kidney stones [  ]; hematuria[  ];   dysuria [  ];  nocturia[  ];  history of     obstruction [  ]; urinary frequency [  ]             Skin: rash, swelling[  ];, hair loss[  ];  peripheral edema[  ];  or itching[  ]; Musculosketetal: myalgias[  ];  joint swelling[  ];  joint erythema[  ];  joint pain[  ];  back pain[  ];  Heme/Lymph: bruising[  ];  bleeding[  ];  anemia[  ];  Neuro: TIA[  ];  headaches[  ];  stroke[  ];  vertigo[  ];  seizures[ n ];   paresthesias[  ];  difficulty walking[  ];  Psych:depression[  ]; anxiety[  ];  Endocrine: diabetes[  ];  thyroid dysfunction[  ];  Immunizations: Flu up to date [ y ]; Pneumococcal up to date [ n ];  Other:  Physical Exam: BP (!) 143/85 (BP Location: Left Arm, Patient Position: Sitting, Cuff Size: Large)   Pulse 64   Resp 16   Ht 5\' 9"  (1.753 m)   Wt 196 lb (88.9 kg)   SpO2 99% Comment: RA  BMI 28.94 kg/m   PHYSICAL EXAMINATION:  Patient has no cervical supraclavicular adenopathy right port is in place General appearance: alert and cooperative Head: Normocephalic, without obvious abnormality, atraumatic Neck: no adenopathy, no carotid bruit, no JVD, supple, symmetrical, trachea midline and thyroid not  enlarged, symmetric, no tenderness/mass/nodules Lymph nodes: Cervical, supraclavicular, and axillary nodes normal. Resp: clear to auscultation bilaterally Back: symmetric, no curvature. ROM normal. No CVA tenderness. Cardio: regular rate and rhythm, S1, S2 normal, no murmur, click, rub or gallop GI: soft, non-tender; bowel sounds normal; no masses,  no organomegaly Extremities: extremities normal, atraumatic, no cyanosis or edema and Homans sign is negative, no sign of DVT Neurologic: Grossly normal  Wt Readings from Last 3 Encounters:  07/02/16 196 lb (88.9 kg)  06/04/16 197 lb 8 oz (89.6 kg)  04/22/16 181 lb 6.4 oz (82.3 kg)    Diagnostic Studies & Laboratory data:     Recent Radiology Findings:   Ct Chest W Contrast & Ct Abdomen Pelvis W Contrast  Result Date: 02/24/2016 CLINICAL DATA:  Restaging esophageal carcinoma EXAM: CT CHEST, ABDOMEN,  AND PELVIS WITH CONTRAST TECHNIQUE: Multidetector CT imaging of the chest, abdomen and pelvis was performed following the standard protocol during bolus administration of intravenous contrast. CONTRAST:  193mL ISOVUE-300 IOPAMIDOL (ISOVUE-300) INJECTION 61% COMPARISON:  PET-CT from 12/03/2015 FINDINGS: CT CHEST FINDINGS Cardiovascular: The heart size is normal. Aortic atherosclerosis identified. LAD and left circumflex coronary artery calcifications identified. Mediastinum/Nodes: No mediastinal or hilar adenopathy identified. Circumferential wall thickening involving the mid thoracic esophagus is again noted. This appears improved from previous exam. At the level of the aortic arch the esophagus, at its thickest, measures 1.8 x 1.8 cm, image 16 of series 2. Previously 2.6 x 3.3 cm. Lungs/Pleura: No pleural effusion identified. Mild changes of centrilobular emphysema no suspicious pulmonary nodules identified. Musculoskeletal: Degenerative disc disease is identified within the thoracic spine. No chest wall mass or suspicious bone lesions identified. CT  ABDOMEN PELVIS FINDINGS Hepatobiliary: No focal liver abnormality is seen. No gallstones, gallbladder wall thickening, or biliary dilatation. Pancreas: Unremarkable. No pancreatic ductal dilatation or surrounding inflammatory changes. Spleen: Calcified granulomas identified within the spleen. Adrenals/Urinary Tract: The adrenal glands are normal. Parapelvic cyst identified within the left kidney measuring 1.4 cm, image 58 of series 2. Urinary bladder appears normal. Stomach/Bowel: The stomach is normal. The small bowel loops have a normal course and caliber. No obstruction. Unremarkable appearance of the colon. Vascular/Lymphatic: Aortic atherosclerosis. No enlarged upper abdominal lymph nodes. No pelvic or inguinal adenopathy. Reproductive: Prostate is unremarkable. Other: There is no ascites or focal fluid collections within the abdomen or pelvis. Musculoskeletal: No suspicious bone lesions identified. Spondylosis noted within the lumbar spine. IMPRESSION: 1. Interval improvement and mid esophageal mass. 2. No evidence for metastatic adenopathy or distant metastatic disease. 3. Aortic atherosclerosis and coronary artery calcification. Electronically Signed   By: Kerby Moors M.D.   On: 02/24/2016 16:33  Ct Head Wo Contrast  Result Date: 12/19/2015 CLINICAL DATA:  Patient with history of psychiatric issues. History of esophageal carcinoma. EXAM: CT HEAD WITHOUT CONTRAST TECHNIQUE: Contiguous axial images were obtained from the base of the skull through the vertex without intravenous contrast. COMPARISON:  Brain CT 01/05/2014 FINDINGS: Brain: Ventricles and sulci are appropriate for patient's age. No evidence for acute cortically based infarct, intracranial hemorrhage, mass lesion or mass-effect. Vascular: No hyperdense vessel or unexpected calcification. Skull: Normal. Negative for fracture or focal lesion. Sinuses/Orbits: No acute finding. Other: None. IMPRESSION: No acute intracranial process. Electronically  Signed   By: Lovey Newcomer M.D.   On: 12/19/2015 15:51   Nm Pet Image Initial (pi) Skull Base To Thigh  Result Date: 12/03/2015 CLINICAL DATA:  Initial treatment strategy for esophageal cancer of the middle third of the esophagus. EXAM: NUCLEAR MEDICINE PET SKULL BASE TO THIGH TECHNIQUE: 12.7 mCi F-18 FDG was injected intravenously. Full-ring PET imaging was performed from the skull base to thigh after the radiotracer. CT data was obtained and used for attenuation correction and anatomic localization. FASTING BLOOD GLUCOSE:  Value: 11 mg/dl COMPARISON:  CT abdomen 11/13/2015 FINDINGS: NECK No hypermetabolic lymph nodes in the neck. CHEST A 3 cm segment of circumferential esophageal wall thickening at the level of the carina with intense metabolic activity (SUV max equal 18.2). Single wall thickness measures 1.5 cm High RIGHT paratracheal lymph node measures 7 mm (image 964, series 4) with low metabolic activity the hypermetabolic activity (SUV max equaled 2.1). No hypermetabolic paraesophageal lymph nodes. No suspicious pulmonary nodules. ABDOMEN/PELVIS No hypermetabolic gastrohepatic ligament nodes. No abnormal metabolic activity in the liver. No hypermetabolic retroperitoneal or  pelvic lymph nodes. Physiologic activity noted within the colon. SKELETON No focal hypermetabolic activity to suggest skeletal metastasis. IMPRESSION: 1. Long segment of hypermetabolic thickening in the mid esophagus consistent with esophageal carcinoma. 2. No clear evidence of mediastinal nodal metastasis. Single high RIGHT paratracheal lymph node with low metabolic activity 3. No evidence of liver metastasis or upper abdominal nodal metastasis Electronically Signed   By: Suzy Bouchard M.D.   On: 12/03/2015 10:53      I have independently reviewed the above radiologic studies.  Recent Lab Findings: Lab Results  Component Value Date   WBC 4.0 06/01/2016   HGB 11.3 (L) 06/01/2016   HCT 35.8 (L) 06/01/2016   PLT 202  06/01/2016   GLUCOSE 103 06/01/2016   ALT 25 06/01/2016   AST 20 06/01/2016   NA 141 06/01/2016   K 4.1 06/01/2016   CL 107 04/14/2016   CREATININE 1.0 06/01/2016   BUN 8.1 06/01/2016   CO2 27 06/01/2016   INR 1.05 04/14/2016   Findings: A large, ulcerating mass with no bleeding and stigmata of recent bleeding was found in the upper third of the esophagus and in the middle third of the esophagus. The mass was non-obstructing and partially circumferential (involving one-half of the lumen circumference). Endosonographic Finding A mass was found in the cervical esophagus and in the thoracic esophagus. The procedure was difficult to perform as a result of patient tolerance issues combined with rapid HR and severe HTN. In the upper to mid esophagus a large protruding mass was identified. The surrounding mucosa was friable and ulcerated. There was some initial difficulty with passing the radial echoendoscope through this area. Because of the patient's combativeness and the protrusion of the mass I was not able to obtain an accurate measurement of the mass with endoscopic visualization. Evaluation with ultrasound was challenging, but a large eccentric hypoechoic mass with irregular borders was identified. In the proximal portion it appeared to be noncircumfirential, but in the mid portion and distally the lesion eccentrically circumfirential. At the largest portion of the mass, it measured 12.8 mm in depth. At this point, the mass protruded beyond the adventitia (T3). In the area it was not clear to me if the mass extended or if it was a collection of lymph nodes. A couple of peritumoral lymph nodes were identified just proximal to this area. Evaluation of the Celiac axis was negative for any evidence of lymph nodes. The left lobe of the liver was negative for any suspicious lesions and the left adrenal was normal in appearance. - Malignant esophageal tumor was found in the upper third of the  esophagus and in the middle third of the esophagus. - A mass was found in the cervical esophagus and in the thoracic esophagus. The diagnosis is squamous cell carcinoma. This was staged T3 N1/N2 Mx by endosonographic criteria. - No specimens collected  Repeat EUS: 03/10/2016 Findings: One moderate benign-appearing, intrinsic stenosis was found 25 cm from the incisors. This measured 1.1 cm (inner diameter) x 1 cm (in length) and was traversed. The entire examined stomach was endoscopically normal. The examined duodenum was endoscopically normal. Endosonographic Finding Localized wall thickening was visualized endosonographically in the thoracic esophagus. The thickness of the abnormal layers measured 6 mm. The esophageal wall measured up to 8 mm in total thickness. Gross visualization of the esophagus revealed a marked improvement. There was no gross evidence of any mass in the esophageal lumen. Stenosis was encountered at 25 cm, but with gentle pressure the  echoendoscope was able to be maneuvered through the area. A mild dilation of the mucosa occurred. With the ultrasound view, the esohpagus was thickened starting around 23 cm and it extended down in to the lower esophagus. Where the tumor was the thickest, prior to treatment, there was a marked reduction in volume. The thickness decreased from 12.8 mm down to 6-7.7 mm. Again, restaging post treatment is not the most reliable, however, it appears to be a T3. I cannot differentiate between actual tumor versus fibrosis/inflammation. Some peritumoral shoddy lymph nodes were noted.   Follow up CT 06/01/2016 Study Result   CLINICAL DATA:  Restaging distal esophageal cancer, diagnosed 10/2015, chemotherapy and XRT complete  EXAM: CT CHEST, ABDOMEN, AND PELVIS WITH CONTRAST  TECHNIQUE: Multidetector CT imaging of the chest, abdomen and pelvis was performed following the standard protocol during bolus administration of intravenous  contrast.  CONTRAST:  198mL ISOVUE-300 IOPAMIDOL (ISOVUE-300) INJECTION 61%  COMPARISON:  CT chest abdomen pelvis dated 02/24/2016. PET-CT dated 12/03/2015.  FINDINGS: CT CHEST FINDINGS  Cardiovascular: The heart is top-normal in size. No pericardial effusion.  Mild coronary atherosclerosis in the LAD.  No evidence of thoracic aortic aneurysm.  Right chest port terminates in the lower SVC.  Mediastinum/Nodes: No suspicious mediastinal lymphadenopathy.  Mid/distal esophagus is mildly thick walled (series 2/image 27) without residual mass.  Visualized thyroid is unremarkable.  Lungs/Pleura: Mild paramediastinal radiation changes bilaterally.  Mild centrilobular emphysematous changes.  No focal consolidation.  No suspicious pulmonary nodules.  No pleural effusion or pneumothorax.  Musculoskeletal: Degenerative changes of the thoracic spine.  CT ABDOMEN PELVIS FINDINGS  Hepatobiliary: Liver is within normal limits.  Gallbladder is unremarkable. No intrahepatic or extrahepatic ductal dilatation.  Pancreas: Within normal limits.  Spleen: Calcified splenic granulomata.  Adrenals/Urinary Tract: Adrenal glands are within normal limits.  1.6 cm left upper pole renal sinus cyst (series 7/ image 17). Right kidney is within normal limits. No hydronephrosis.  Bladder is mildly thick-walled although underdistended.  Stomach/Bowel: Stomach is within normal limits.  No evidence of bowel obstruction.  Normal appendix (series 2/ image 85).  Sigmoid diverticulosis, without evidence of diverticulitis.  Left colon is decompressed.  Vascular/Lymphatic: No evidence of abdominal aortic aneurysm.  Atherosclerotic calcifications of the abdominal aorta and branch vessels.  No suspicious abdominopelvic lymphadenopathy.  Reproductive: Prostate is unremarkable.  Other: No abdominopelvic ascites.  Musculoskeletal: Degenerative changes of  the lumbar spine.  IMPRESSION: Mild mid esophageal wall thickening without residual mass on CT.  Radiation changes in the paramediastinal lung bilaterally.  No evidence of recurrent or metastatic disease.   Electronically Signed   By: Julian Hy M.D.   On: 06/01/2016 15:24    Assessment / Plan:   Patient with advanced squamous cell carcinoma of the esophagus, he is undergone treatment with radiation and chemotherapy with improvement of symptoms and swallowing ability. Was  being evaluated for possible esophageal resection. I   discussed with the patient and his wife in detail the risks  involved with esophagectomy at  his age and functional status . He comes in by himself today. He notes that he and his wife do not think he wants to have esophageal resection, with his age, somewhat frail state, I agree with his decision, he is currently well palliated , gaining weight and eating without difficultly. I will plan to see him back in 3 months. Recent follow up scan reviewed with him   Grace Isaac MD      [address]  [place] [address] Office [place] [phone]  07/02/2016 11:36 AM

## 2016-07-20 DIAGNOSIS — R600 Localized edema: Secondary | ICD-10-CM | POA: Diagnosis not present

## 2016-07-31 ENCOUNTER — Telehealth: Payer: Self-pay | Admitting: *Deleted

## 2016-07-31 NOTE — Telephone Encounter (Signed)
Called patient to alter fu appt. On 09-09-16 from 2 pm to 9:30 am, lvm for a return call

## 2016-08-03 DIAGNOSIS — R7301 Impaired fasting glucose: Secondary | ICD-10-CM | POA: Diagnosis not present

## 2016-08-03 DIAGNOSIS — E782 Mixed hyperlipidemia: Secondary | ICD-10-CM | POA: Diagnosis not present

## 2016-08-03 DIAGNOSIS — R6889 Other general symptoms and signs: Secondary | ICD-10-CM | POA: Diagnosis not present

## 2016-08-03 DIAGNOSIS — R600 Localized edema: Secondary | ICD-10-CM | POA: Diagnosis not present

## 2016-08-03 DIAGNOSIS — G609 Hereditary and idiopathic neuropathy, unspecified: Secondary | ICD-10-CM | POA: Diagnosis not present

## 2016-08-18 ENCOUNTER — Encounter (HOSPITAL_COMMUNITY): Payer: Self-pay

## 2016-08-18 ENCOUNTER — Emergency Department (HOSPITAL_COMMUNITY)
Admission: EM | Admit: 2016-08-18 | Discharge: 2016-08-19 | Disposition: A | Discharge status: Home / Self Care | Payer: Medicare HMO | Attending: Emergency Medicine | Admitting: Emergency Medicine

## 2016-08-18 ENCOUNTER — Emergency Department (HOSPITAL_COMMUNITY): Payer: Medicare HMO

## 2016-08-18 DIAGNOSIS — R072 Precordial pain: Secondary | ICD-10-CM

## 2016-08-18 DIAGNOSIS — J189 Pneumonia, unspecified organism: Secondary | ICD-10-CM | POA: Diagnosis not present

## 2016-08-18 DIAGNOSIS — Z87891 Personal history of nicotine dependence: Secondary | ICD-10-CM | POA: Diagnosis not present

## 2016-08-18 DIAGNOSIS — R0600 Dyspnea, unspecified: Secondary | ICD-10-CM

## 2016-08-18 DIAGNOSIS — E119 Type 2 diabetes mellitus without complications: Secondary | ICD-10-CM | POA: Insufficient documentation

## 2016-08-18 DIAGNOSIS — Z8501 Personal history of malignant neoplasm of esophagus: Secondary | ICD-10-CM | POA: Diagnosis not present

## 2016-08-18 DIAGNOSIS — Z7982 Long term (current) use of aspirin: Secondary | ICD-10-CM | POA: Insufficient documentation

## 2016-08-18 DIAGNOSIS — R079 Chest pain, unspecified: Secondary | ICD-10-CM | POA: Diagnosis not present

## 2016-08-18 DIAGNOSIS — J181 Lobar pneumonia, unspecified organism: Secondary | ICD-10-CM | POA: Diagnosis not present

## 2016-08-18 DIAGNOSIS — R0602 Shortness of breath: Secondary | ICD-10-CM | POA: Diagnosis not present

## 2016-08-18 LAB — CBC WITH DIFFERENTIAL/PLATELET
Basophils Absolute: 0 10*3/uL (ref 0.0–0.1)
Basophils Relative: 1 %
Eosinophils Absolute: 0.1 10*3/uL (ref 0.0–0.7)
Eosinophils Relative: 3 %
HCT: 34.1 % — ABNORMAL LOW (ref 39.0–52.0)
Hemoglobin: 10.4 g/dL — ABNORMAL LOW (ref 13.0–17.0)
Lymphocytes Relative: 18 %
Lymphs Abs: 0.8 10*3/uL (ref 0.7–4.0)
MCH: 22 pg — ABNORMAL LOW (ref 26.0–34.0)
MCHC: 30.5 g/dL (ref 30.0–36.0)
MCV: 72.1 fL — ABNORMAL LOW (ref 78.0–100.0)
Monocytes Absolute: 0.5 10*3/uL (ref 0.1–1.0)
Monocytes Relative: 12 %
Neutro Abs: 2.9 10*3/uL (ref 1.7–7.7)
Neutrophils Relative %: 66 %
Platelets: 280 10*3/uL (ref 150–400)
RBC: 4.73 MIL/uL (ref 4.22–5.81)
RDW: 15.1 % (ref 11.5–15.5)
WBC: 4.3 10*3/uL (ref 4.0–10.5)

## 2016-08-18 LAB — COMPREHENSIVE METABOLIC PANEL
ALT: 12 U/L — ABNORMAL LOW (ref 17–63)
AST: 29 U/L (ref 15–41)
Albumin: 3.3 g/dL — ABNORMAL LOW (ref 3.5–5.0)
Alkaline Phosphatase: 74 U/L (ref 38–126)
Anion gap: 7 (ref 5–15)
BUN: 6 mg/dL (ref 6–20)
CO2: 26 mmol/L (ref 22–32)
Calcium: 8.7 mg/dL — ABNORMAL LOW (ref 8.9–10.3)
Chloride: 106 mmol/L (ref 101–111)
Creatinine, Ser: 0.96 mg/dL (ref 0.61–1.24)
GFR calc Af Amer: >60 mL/min (ref >60)
GFR calc non Af Amer: >60 mL/min (ref >60)
Glucose, Bld: 111 mg/dL — ABNORMAL HIGH (ref 65–99)
Potassium: 5.3 mmol/L — ABNORMAL HIGH (ref 3.5–5.1)
Sodium: 139 mmol/L (ref 135–145)
Total Bilirubin: 1.2 mg/dL (ref 0.3–1.2)
Total Protein: 6.2 g/dL — ABNORMAL LOW (ref 6.5–8.1)

## 2016-08-18 LAB — D-DIMER, QUANTITATIVE: D-Dimer, Quant: 1.07 ug/mL-FEU — ABNORMAL HIGH (ref 0.00–0.50)

## 2016-08-18 LAB — TROPONIN I: Troponin I: <0.03 ng/mL (ref <0.03)

## 2016-08-18 LAB — BRAIN NATRIURETIC PEPTIDE: B Natriuretic Peptide: 41.8 pg/mL (ref 0.0–100.0)

## 2016-08-18 MED ORDER — SODIUM CHLORIDE 0.9 % IV BOLUS (SEPSIS)
500.0000 mL | Freq: Once | INTRAVENOUS | Status: AC
  Administered 2016-08-18: 500 mL via INTRAVENOUS

## 2016-08-18 NOTE — ED Triage Notes (Signed)
Pt brought in by EMS due to having chest pain and SOB for 2 weeks. Per pt, pain radiates to neck. Pt received 324mg  of aspirin. Pt a&ox4.

## 2016-08-18 NOTE — ED Provider Notes (Signed)
[facility] DEPT Provider Note   CSN: [id number] Arrival date & time: 08/18/16  1900     History   Chief Complaint Chief Complaint  Patient presents with  . Chest Pain    HPI Juan [name] is a 76 y.o. male.  HPI Pt comes in with cc of dib and chest pain. Pt has hx of esophageal CA, DM, LBBB. Pt reports that for the last 2 weeks he has been having intermittent chest pain to the L side. Chest pain is described as "something moving" in there and the chest pain moves to the back. Pt's pain is unprovoked and there is no specific aggravating or relieving factors. Pt has no associated dib, diaphoresis, nausea  with the chest pain.  Pt reports exertional dyspnea x 2 weeks. Pt has dib with exertion, now with just 15 feet he is short of breath. Normally pt is able to walk 2-3 blocks.  Pt has a cough, it is dry. Pt has no hx of PE, DVT and denies any exogenous hormone (testosterone / estrogen) use, long distance travels or surgery in the past 6 weeks, recent immobilization. Pt was diagnosed with cancer of the esophagus, and he is s/p chemo and radiation.  Past Medical History:  Diagnosis Date  . Diabetes mellitus without complication ([place])   . Esophageal cancer ([place])   . LBBB (left bundle branch block) 02/11/2016  . Reflux     Patient Active Problem List   Diagnosis Date Noted  . LBBB (left bundle branch block) 02/11/2016  . Port catheter in place 12/25/2015  . 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 Esophageal cancer ([place]) 11/22/2015  . Noise effect on both inner ears 08/29/2015  . Right thyroid nodule 08/29/2015    Past Surgical History:  Procedure Laterality Date  . EUS N/A 12/05/2015   Procedure: UPPER ENDOSCOPIC ULTRASOUND (EUS) LINEAR;  Surgeon: Carol Ada, MD;  Location: [facility] ENDOSCOPY;  Service: Endoscopy;  Laterality: N/A;  . EUS N/A 03/10/2016   Procedure: UPPER ENDOSCOPIC ULTRASOUND (EUS) LINEAR;  Surgeon: Carol Ada, MD;   Location: [facility] ENDOSCOPY;  Service: Endoscopy;  Laterality: N/A;  . HERNIA REPAIR    . IR GENERIC HISTORICAL  12/24/2015   IR FLUORO GUIDE PORT INSERTION RIGHT 12/24/2015 Arne Cleveland, MD [facility]  . IR GENERIC HISTORICAL  12/24/2015   IR US GUIDE VASC ACCESS RIGHT 12/24/2015 Arne Cleveland, MD [facility]  . SHOULDER ARTHROSCOPY W/ SUPERIOR LABRAL ANTERIOR POSTERIOR LESION REPAIR         Home Medications    Prior to Admission medications   Medication Sig Start Date End Date Taking? Authorizing Provider  alfuzosin (UROXATRAL) 10 MG 24 hr tablet Take 10 mg by mouth daily with breakfast.    [provider]  amitriptyline (ELAVIL) 10 MG tablet Take 10 mg by mouth at bedtime.  11/16/15 11/15/16  [provider]  aspirin EC 81 MG tablet Take 81 mg by mouth daily.    [provider]  atorvastatin (LIPITOR) 20 MG tablet Take 20 mg by mouth at bedtime.  11/30/13   [provider]  oxyCODONE (ROXICODONE) 15 MG immediate release tablet Take 1 tablet (15 mg total) by mouth every 6 (six) hours as needed for pain. 04/24/16   Robyn Haber, MD  pantoprazole (PROTONIX) 40 MG tablet Take 1 tablet (40 mg total) by mouth daily. 03/09/16   Lorella Nimrod, MD  predniSONE (DELTASONE) 20 MG tablet Two daily with food 04/24/16   Lauenstein,  Synetta Shadow, MD  prochlorperazine (COMPAZINE) 10 MG tablet Take 1 tablet (10 mg total) by mouth every 6 (six) hours as needed (Nausea or vomiting). 01/15/16   [name], MD  sodium chloride (OCEAN) 0.65 % SOLN nasal spray Place 1 spray into both nostrils as needed for congestion. 03/02/16   Lorella Nimrod, MD  sucralfate (CARAFATE) 1 g tablet TAKE 1 TABLET 4 TIMES A DAY 06/10/16   Kyung Rudd, MD    Family History Family History  Problem Relation Age of Onset  . Colon cancer Father 69  . Colon cancer Brother 58  . Heart attack Brother   . Heart disease Mother   . Heart attack Mother   . Diabetes Sister   . Stroke Sister     Social  History Social History  Substance Use Topics  . Smoking status: Former Smoker    Packs/day: 2.00    Years: 40.00    Quit date: 03/31/1999  . Smokeless tobacco: Never Used  . Alcohol use No     Comment: weekend binge drinking for 30-40 years, quit in 2001     Allergies   Patient has no known allergies.   Review of Systems Review of Systems  Constitutional: Positive for activity change. Negative for chills and fever.  Eyes: Negative for visual disturbance.  Respiratory: Positive for cough, chest tightness and shortness of breath.   Cardiovascular: Positive for chest pain.  Gastrointestinal: Negative for abdominal distention.  Genitourinary: Negative for difficulty urinating, dysuria and enuresis.  Musculoskeletal: Negative for neck pain and neck stiffness.  Skin: Negative for rash.  Neurological: Positive for dizziness. Negative for light-headedness and headaches.  Hematological: Does not bruise/bleed easily.  Psychiatric/Behavioral: Negative for confusion.     Physical Exam Updated Vital Signs BP (!) 156/81   Pulse 67   Temp 98 F (36.7 C) (Oral)   Resp (!) 21   SpO2 96%   Physical Exam  Constitutional: He is oriented to person, place, and time. He appears well-developed.  HENT:  Head: Normocephalic and atraumatic.  Eyes: Conjunctivae and EOM are normal. Pupils are equal, round, and reactive to light.  Neck: Normal range of motion. Neck supple. No JVD present.  Cardiovascular: Normal rate and regular rhythm.   Pulmonary/Chest: Effort normal and breath sounds normal.  Abdominal: Soft. Bowel sounds are normal. He exhibits no distension and no mass. There is no tenderness. There is no rebound and no guarding.  Musculoskeletal: He exhibits edema. He exhibits no deformity.  LLE unilateral swelling  Neurological: He is alert and oriented to person, place, and time.  Skin: Skin is warm.  Nursing note and vitals reviewed.    ED Treatments / Results  Labs (all labs  ordered are listed, but only abnormal results are displayed) Labs Reviewed  COMPREHENSIVE METABOLIC PANEL - Abnormal; Notable for the following:       Result Value   Potassium 5.3 (*)    Glucose, Bld 111 (*)    Calcium 8.7 (*)    Total Protein 6.2 (*)    Albumin 3.3 (*)    ALT 12 (*)    All other components within normal limits  CBC WITH DIFFERENTIAL/PLATELET - Abnormal; Notable for the following:    Hemoglobin 10.4 (*)    HCT 34.1 (*)    MCV 72.1 (*)    MCH 22.0 (*)    All other components within normal limits  D-DIMER, QUANTITATIVE (NOT AT [facility]) - Abnormal; Notable for the following:    D-Dimer, Quant 1.07 (*)  All other components within normal limits  BRAIN NATRIURETIC PEPTIDE  TROPONIN I  TROPONIN I    EKG  EKG Interpretation  Date/Time:  Tuesday Aug 18 2016 19:04:28 EDT Ventricular Rate:  61 PR Interval:    QRS Duration: 158 QT Interval:  475 QTC Calculation: 479 R Axis:   -33 Text Interpretation:  Sinus rhythm Multiform ventricular premature complexes Left bundle branch block No acute changes No significant change since last tracing Confirmed by Varney Biles [phone]) on 08/18/2016 7:25:54 PM       Radiology Dg Chest 2 View  Result Date: 08/18/2016 CLINICAL DATA:  Chest pain and shortness of breath EXAM: CHEST  2 VIEW COMPARISON:  06/01/2016, 04/14/2016 FINDINGS: Right-sided central venous port tip overlies the mid SVC. No acute pulmonary infiltrate, consolidation, or pleural effusion. Stable cardiomediastinal silhouette. No pneumothorax. Stable round calcification projecting over the lateral left lung apex. IMPRESSION: No active cardiopulmonary disease. Electronically Signed   By: Donavan Foil M.D.   On: 08/18/2016 22:03    Procedures Procedures (including critical care time)  Medications Ordered in ED Medications  sodium chloride 0.9 % bolus 500 mL (500 mLs Intravenous New Bag/Given 08/18/16 2348)  iopamidol (ISOVUE-370) 76 % injection (not administered)      Initial Impression / Assessment and Plan / ED Course  I have reviewed the triage vital signs and the nursing notes.  Pertinent labs & imaging results that were available during my care of the patient were reviewed by me and considered in my medical decision making (see chart for details).  Clinical Course as of Aug 19 12  Wed Aug 19, 2016  0013 CTPE ordered. Results from the ER workup discussed with the patient face to face and all questions answered to the best of my ability. Pt given update about the pending CT PE. D-Dimer, Quant: (!) 1.07 [AN]    Clinical Course User Index [AN] Varney Biles, MD    Pt comes in with cc of dyspnea and chest pain. Chest pain is atypical. Pt has DM hx.  HEAR score is 31 (age and risk factors). Plan is to get trops x 2. EKG is unchanged. Pt also has exertional dyspnea. We will get dimer given the suspicion for PE.   Final Clinical Impressions(s) / ED Diagnoses   Final diagnoses:  Dyspnea  Precordial chest pain    New Prescriptions New Prescriptions   No medications on file     Varney Biles, MD 08/19/16 [phone]

## 2016-08-18 NOTE — ED Notes (Addendum)
Pt states he feels "tired" and "short of breath when I walk to the bathroom"  No acute distress at this time.  O2 sats stable. Will continue to monitor.

## 2016-08-18 NOTE — ED Notes (Signed)
Pt ambulated to restroom. 

## 2016-08-19 ENCOUNTER — Emergency Department (HOSPITAL_COMMUNITY): Payer: Medicare HMO

## 2016-08-19 DIAGNOSIS — J181 Lobar pneumonia, unspecified organism: Secondary | ICD-10-CM | POA: Diagnosis not present

## 2016-08-19 LAB — TROPONIN I: Troponin I: <0.03 ng/mL (ref <0.03)

## 2016-08-19 MED ORDER — IOPAMIDOL (ISOVUE-370) INJECTION 76%
INTRAVENOUS | Status: AC
  Administered 2016-08-19: 100 mL
  Filled 2016-08-19: qty 100

## 2016-08-19 MED ORDER — AZITHROMYCIN 250 MG PO TABS: 250.0000 mg | ORAL_TABLET | Freq: Every day | ORAL | 0 refills | Status: DC

## 2016-08-19 NOTE — ED Notes (Signed)
Patient transported to CT 

## 2016-08-19 NOTE — ED Provider Notes (Signed)
12:25 AM  Assumed care from Dr. Kathrynn Humble.  Briefly, patient is a 76 year old male with history of diabetes, esophageal cancer who presents the emergency department with chest pain, shortness of breath. First troponin negative. D-dimer was positive. Plan is to repeat second troponin and obtain a CT of his chest. If workup is negative, patient will be discharged home.   EKG Interpretation  Date/Time:  Tuesday Aug 18 2016 19:04:28 EDT Ventricular Rate:  61 PR Interval:    QRS Duration: 158 QT Interval:  475 QTC Calculation: 479 R Axis:   -33 Text Interpretation:  Sinus rhythm Multiform ventricular premature complexes Left bundle branch block No acute changes No significant change since last tracing Confirmed by Varney Biles [phone]) on 08/18/2016 7:25:54 PM      2:40 AM  Pt's Second troponin is negative. He is still awaiting his CTA chest.  3:50 AM  Pt's CT scan shows no pulmonary embolus. He has hazy opacities in both lungs that is suggestive of possible edema versus pneumonia. BNP is normal. No signs of volume overload. Doubt that this is edema. He states he has been coughing white, yellow appearing sputum but no fevers, chills. States that the cough is what is causing him to have chest soreness. Given this could be early onset pneumonia, will start him on antibiotics. He has no respiratory distress, increased work of breathing or hypoxia. No leukocytosis. No fever. I feel he is safe for outpatient management and patient is comfortable with plan for discharge home. Has PCP follow-up as needed.  At this time, I do not feel there is any life-threatening condition present. I have reviewed and discussed all results (EKG, imaging, lab, urine as appropriate) and exam findings with patient/family. I have reviewed nursing notes and appropriate previous records.  I feel the patient is safe to be discharged home without further emergent workup and can continue workup as an outpatient as needed. Discussed  usual and customary return precautions. Patient/family verbalize understanding and are comfortable with this plan.  Outpatient follow-up has been provided if needed. All questions have been answered.    Cheyan Frees, Delice Bison, DO 08/19/16 [phone]

## 2016-08-19 NOTE — Discharge Instructions (Addendum)
All the results in the ER are normal, labs and imaging.   Your CT scan showed no blood clot in your lungs. It did show that you could have possible early onset pneumonia given you're coughing, we are sending you home with antibiotics.   The workup in the ER is not complete, and is limited to screening for life threatening and emergent conditions only, so please see a primary care doctor for further evaluation.  Please return to the ER if you have worsening chest pain, shortness of breath, pain radiating to your jaw, shoulder, or back, sweats or fainting. Otherwise see the Cardiologist or your primary care doctor as requested.

## 2016-08-19 NOTE — ED Notes (Signed)
Pt ambulated to restroom. 

## 2016-08-24 ENCOUNTER — Other Ambulatory Visit: Payer: Self-pay | Admitting: Radiation Oncology

## 2016-08-24 DIAGNOSIS — C154 Malignant neoplasm of middle third of esophagus: Secondary | ICD-10-CM

## 2016-08-31 ENCOUNTER — Telehealth: Payer: Self-pay | Admitting: *Deleted

## 2016-08-31 NOTE — Telephone Encounter (Signed)
"  I need to know when is my next appointment."  Returned call notifying of the 09-04-2016 appointment information.  Asked when he should arrive.  Asked that he arrive at 8:15 for registation process.  No further questions.

## 2016-09-02 ENCOUNTER — Telehealth: Payer: Self-pay

## 2016-09-02 NOTE — Telephone Encounter (Signed)
Pt received call with no message. Gave him upcoming appts. No other notes on chart

## 2016-09-03 DIAGNOSIS — J189 Pneumonia, unspecified organism: Secondary | ICD-10-CM | POA: Diagnosis not present

## 2016-09-03 DIAGNOSIS — R6889 Other general symptoms and signs: Secondary | ICD-10-CM | POA: Diagnosis not present

## 2016-09-03 DIAGNOSIS — F1190 Opioid use, unspecified, uncomplicated: Secondary | ICD-10-CM | POA: Diagnosis not present

## 2016-09-03 NOTE — Progress Notes (Signed)
[place]  [phone] [phone]) [phone]  Clinic follow Up Note   Patient Care Team: [place], [place] Merrilee Seashore), MD (Inactive) as PCP - General (Internal Medicine) [name], MD as Consulting Physician (Hematology) Kyung Rudd, MD as Consulting Physician (Radiation Oncology) Tania Ade, RN as Registered Nurse Juanita Craver, MD as Consulting Physician (Gastroenterology) 09/04/2016  CHIEF COMPLAINTS:  Follow up esophageal squamous cell carcinoma  Oncology History   Presented with dysphagia and odynophagia with 20-25 pounds of weight loss in about 3-4 months  Esophageal cancer [facility])   Staging form: Esophagus - Adenocarcinoma, AJCC 7th Edition   - Clinical stage from 11/13/2015: Stage IIIB (T3, N2, M0, G2) - Signed by [name], MD on 12/11/2015      Esophageal cancer ([place])   11/13/2015 Procedure    UPPER ENDOSCOPY per Dr. Collene Mares: Large fungating, friable bleeding mass in middle third of esophagus, 22cm from incisors and extended to 27cm. Non-obstructing.      11/13/2015 Pathology Results    Invasive squamous cell carcinoma; moderately differentiated      11/13/2015 Imaging    CT ABD/PELVIS: IMPRESSION:No evidence of metastatic disease in the abdomen or pelvis. 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 Aortoiliac atherosclerosis. Small bilateral inguinal hernias containing fat the      11/22/2015 Initial Diagnosis    Esophageal cancer ([place])     12/03/2015 Imaging    PET scan showed a long segment of hypermetabolic thickening in the mid esophagus consistent with esophageal carcinoma. No clear evidence of node metastasis or distant metastasis.      12/10/2015 - 01/14/2016 Radiation Therapy    Neoadjuvant radiation to his esophageal cancer      12/10/2015 - 01/15/2016 Chemotherapy    Neoadjuvant weekly carboplatin AUC 2, and Taxol 45 mg/m, with concurrent radiation. He developed steroid-induced psychosis, and Taxol was changed to Abraxane to avoid  premedication with steroids.      02/24/2016 Imaging    CT CAP with contrast showed interval improvement mid esophageal mass, no evidence for metastatic adenopathy or distant metastasis.       03/10/2016 Procedure    Repeat EGD by Dr. Benson Norway showed wall thickening in the thoracic esophagus with the tumor was. The thickness decreased from 12.8 mm to 6-7 mm. Not able to differentiate between actual tumor versus fibrosis from radiation. Some peritumoral shoddy lymph nodes.      04/07/2016 Surgery    Patient followed up with Dr. Servando Snare, patient is not a great candidate for surgery, pt also does not want surgery, mutually agreed to not pursue esophagectomy.       06/01/2016 Imaging    CT C/A/P IMPRESSION: Mild mid esophageal wall thickening without residual mass on CT. Radiation changes in the paramediastinal lung bilaterally. No evidence of recurrent or metastatic disease.      08/18/2016 -  Hospital Admission    Patient presented to the ER with SOB and complaints of chest pain       HISTORY OF PRESENTING ILLNESS:  Juan [name] 76 y.o. male is here because of His recently diagnosed esophageal cancer. He is accompanied by his wife to my clinic today.  He started having sore throat and dysphagia about 4-5 months ago, and was seen by PCP, had 2 course antibiotics but is symptom progressed. He was referred to gastroenterologist Dr. Suezanne Cheshire, and underwent EGD on 11/13/2015, which showed a large fungating, friable bleeding mass in the middle third of esophagus, 22 cm to 27 cm from incisors, non-obstructing. The  biopsy showed squamous cell carcinoma. His CT abdomen and pelvis was negative for metastatic disease.  He has lost 20lbs, he is on soft diet and liquids, his pain is about 7/10 pain with swallowing. He also has chest pain , intermittent, it lasts about a few minutes, no nausea or vomiting, his bowel movement is normal. He denies melena or hematochezia. He is able to function and tolerated light  activities at home.  CURRENT THERAPY: Observation  INTERIM HISTORY: Mr Zobrist returns for follow-up. He has been doing well. He reports to gaining some weight. He has pain in his left chest that happens mostly while sitting or when he raises his arm, happening 2-3 times a day. He has no problem with his appetite. He tries to exercise daily.   MEDICAL HISTORY:  Past Medical History:  Diagnosis Date  . Diabetes mellitus without complication ([place])   . Esophageal cancer ([place])   . LBBB (left bundle branch block) 02/11/2016  . Reflux     SURGICAL HISTORY: Past Surgical History:  Procedure Laterality Date  . EUS N/A 12/05/2015   Procedure: UPPER ENDOSCOPIC ULTRASOUND (EUS) LINEAR;  Surgeon: Carol Ada, MD;  Location: [facility] ENDOSCOPY;  Service: Endoscopy;  Laterality: N/A;  . EUS N/A 03/10/2016   Procedure: UPPER ENDOSCOPIC ULTRASOUND (EUS) LINEAR;  Surgeon: Carol Ada, MD;  Location: [facility] ENDOSCOPY;  Service: Endoscopy;  Laterality: N/A;  . HERNIA REPAIR    . IR GENERIC HISTORICAL  12/24/2015   IR FLUORO GUIDE PORT INSERTION RIGHT 12/24/2015 Arne Cleveland, MD [facility]-INTER[facility] RAD  . IR GENERIC HISTORICAL  12/24/2015   IR US GUIDE VASC ACCESS RIGHT 12/24/2015 Arne Cleveland, MD [facility]INTER[facility]  . SHOULDER ARTHROSCOPY W/ SUPERIOR LABRAL ANTERIOR POSTERIOR LESION REPAIR      SOCIAL HISTORY: Social History   Social History  . Marital status: Married    Spouse name: N/A  . Number of children: N/A  . Years of education: N/A   Occupational History  . Not on file.   Social History Main Topics  . Smoking status: Former Smoker    Packs/day: 2.00    Years: 40.00    Quit date: 03/31/1999  . Smokeless tobacco: Never Used  . Alcohol use No     Comment: weekend binge drinking for 30-40 years, quit in 2001  . Drug use: No  . Sexual activity: Not on file   Other Topics Concern  . 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 Colon cancer Father 68  . Colon cancer Brother 23  . Heart attack Brother   . Heart disease Mother   . Heart attack Mother   . Diabetes Sister   . Stroke Sister     ALLERGIES:  has No Known Allergies.  MEDICATIONS:  Current Outpatient Prescriptions  Medication Sig Dispense Refill  . alfuzosin (UROXATRAL) 10 MG 24 hr tablet Take 10 mg by mouth daily with breakfast.    . amitriptyline (ELAVIL) 10 MG tablet Take 10 mg by mouth at bedtime.     Marland Kitchen aspirin EC 81 MG tablet Take 81 mg by mouth daily.    Marland Kitchen atorvastatin (LIPITOR) 20 MG tablet Take 20 mg by mouth at bedtime.     . metFORMIN (  GLUCOPHAGE) 500 MG tablet Take 500 mg by mouth 2 (two) times daily with a meal. States takes sometimes    . oxyCODONE (ROXICODONE) 15 MG immediate release tablet Take 1 tablet (15 mg total) by mouth every 6 (six) hours as needed for pain. 30 tablet 0  . pantoprazole (PROTONIX) 40 MG tablet Take 1 tablet (40 mg total) by mouth daily. 30 tablet 0  . sodium chloride (OCEAN) 0.65 % SOLN nasal spray Place 1 spray into both nostrils as needed for congestion. 30 mL 2  . sucralfate (CARAFATE) 1 g tablet DISSOLVE 1 TAB IN 1-2 TABLESPOONS OF WATER AND TAKE BEFORE MEALS AND AT BEDTIME 360 tablet 0  . UNKNOWN TO PATIENT Water pill daily    . predniSONE (DELTASONE) 20 MG tablet Two daily with food (Patient not taking: Reported on 09/04/2016) 10 tablet 0  . prochlorperazine (COMPAZINE) 10 MG tablet Take 1 tablet (10 mg total) by mouth every 6 (six) hours as needed (Nausea or vomiting). (Patient not taking: Reported on 08/19/2016) 30 tablet 1   No current facility-administered medications for this visit.     REVIEW OF SYSTEMS:   Constitutional: Denies fevers, chills or abnormal night sweats (+) fatigue, weakness Eyes: Denies blurriness of vision, double vision  or watery eyes Ears, nose, mouth, throat, and face: Denies mucositis or dysphagia  Respiratory: Denies cough, dyspnea or wheezes (+) SOB Cardiovascular: Denies palpitation, chest discomfort or lower extremity swelling Gastrointestinal:  Denies nausea or change in bowel habits  Skin: Denies abnormal skin rashes Musculoskeletal: (+) arthritis (+)chest pain when raise arm Lymphatics: Denies new lymphadenopathy or easy bruising Neurological:Denies numbness, tingling or new weaknesses Behavioral/Psych: Mood is stable, no new changes  All other systems were reviewed with the patient and are negative.  PHYSICAL EXAMINATION: ECOG PERFORMANCE STATUS: 1 - Symptomatic but completely ambulatory  Vitals:   09/04/16 1018  BP: (!) 160/87  Pulse: 63  Resp: 18  Temp: 97.9 F (36.6 C)   Filed Weights   09/04/16 1018  Weight: 200 lb 6.4 oz (90.9 kg)    GENERAL:alert, no distress and comfortable SKIN: skin color, texture, turgor are normal, no rashes or significant lesions EYES: normal, conjunctiva are pink and non-injected, sclera clear OROPHARYNX:no exudate, no erythema and lips, buccal mucosa, and tongue normal  NECK: supple, thyroid normal size, non-tender, without nodularity LYMPH:  no palpable lymphadenopathy in the cervical, axillary or inguinal LUNGS: clear to auscultation and percussion with normal breathing effort HEART: regular rate & rhythm and no murmurs and no lower extremity edema ABDOMEN:abdomen soft, non-tender and normal bowel sounds Musculoskeletal:no cyanosis of digits and no clubbing  PSYCH: alert & oriented x 3 with fluent speech NEURO: no focal motor/sensory deficits  LABORATORY DATA:  I have reviewed the data as listed CBC Latest Ref Rng & Units 09/04/2016 08/18/2016 06/01/2016  WBC 4.0 - 10.3 10e3/uL 3.7(L) 4.3 4.0  Hemoglobin 13.0 - 17.1 g/dL 10.9(L) 10.4(L) 11.3(L)  Hematocrit 38.4 - 49.9 % 34.6(L) 34.1(L) 35.8(L)  Platelets 140 - 400 10e3/uL 236 280 202   CMP  Latest Ref Rng & Units 09/04/2016 08/18/2016 06/01/2016  Glucose 70 - 140 mg/dl 104 111(H) 103  BUN 7.0 - 26.0 mg/dL 6.8(L) 6 8.1  Creatinine 0.7 - 1.3 mg/dL 0.9 0.96 1.0  Sodium 136 - 145 mEq/L 139 139 141  Potassium 3.5 - 5.1 mEq/L 4.0 5.3(H) 4.1  Chloride 101 - 111 mmol/L - 106 -  CO2 22 - 29 mEq/L 25 26 27   Calcium 8.4 - 10.4  mg/dL 9.3 8.7(L) 9.4  Total Protein 6.4 - 8.3 g/dL 7.1 6.2(L) 7.0  Total Bilirubin 0.20 - 1.20 mg/dL 0.39 1.2 0.55  Alkaline Phos 40 - 150 U/L 87 74 75  AST 5 - 34 U/L 24 29 20   ALT 0 - 55 U/L 22 12(L) 25    PATHOLOGY REPORT  Final microscopic diagnosis Esophagus, 0.2 cm to 27 cm, biopsy -Invasive squamous cell carcinoma, moderately differentiated   RADIOGRAPHIC STUDIES: I have personally reviewed the radiological images as listed and agreed with the findings in the report. Dg Chest 2 View  Result Date: 08/18/2016 CLINICAL DATA:  Chest pain and shortness of breath EXAM: CHEST  2 VIEW COMPARISON:  06/01/2016, 04/14/2016 FINDINGS: Right-sided central venous port tip overlies the mid SVC. No acute pulmonary infiltrate, consolidation, or pleural effusion. Stable cardiomediastinal silhouette. No pneumothorax. Stable round calcification projecting over the lateral left lung apex. IMPRESSION: No active cardiopulmonary disease. Electronically Signed   By: Donavan Foil M.D.   On: 08/18/2016 22:03   Ct Angio Chest Pe W Or Wo Contrast  Result Date: 08/19/2016 CLINICAL DATA:  Dyspnea. History of reflux, esophageal cancer, diabetes EXAM: CT ANGIOGRAPHY CHEST WITH CONTRAST TECHNIQUE: Multidetector CT imaging of the chest was performed using the standard protocol during bolus administration of intravenous contrast. Multiplanar CT image reconstructions and MIPs were obtained to evaluate the vascular anatomy. CONTRAST:  79 mL Isovue 370 COMPARISON:  06/01/2016 FINDINGS: Cardiovascular: Satisfactory opacification of the pulmonary arteries to the segmental level. No evidence of  pulmonary embolism. Normal heart size. No pericardial effusion. Mediastinum/Nodes: No significant lymphadenopathy in the chest. Esophagus is decompressed. Difficult to evaluate for residual wall thickness due to decompression. Thyroid gland is unremarkable. Right central venous catheter with tip in the superior vena cava. Lungs/Pleura: Evaluation is limited due to motion artifact. Hazy opacities in both lungs, more prominent posteriorly, suggesting edema or pneumonia. This is new since previous study. No pleural effusions. No pneumothorax. Airways appear patent. Small bulla in the anterior left lung. Upper Abdomen: Calcified granuloma in the spleen. Musculoskeletal: Degenerative changes in the spine. No destructive bone lesions. Review of the MIP images confirms the above findings. IMPRESSION: No evidence of significant pulmonary embolus. Hazy opacities in both lungs suggesting edema or pneumonia, new since previous study. Electronically Signed   By: Lucienne Capers M.D.   On: 08/19/2016 03:25   EGD 11/13/2015 Dr. Collene Mares  -A large, fungating, friable mass with bleeding was found in the mid third of the esophagus, 22 cm from the incisors and extended to 27 cm. The mass was nonobstructing and a circumferential, biopsy was taken. -Normal stomach -Normal proximal small bowel  Colonoscopy 11/13/2015 Dr. Collene Mares -Diverticulosis in the entire exam: -No specimens collected  EUS 12/05/2015 Malignant esophageal tumor was found in the upper third of the esophagus and in the middle third of the esophagus. - A mass was found in the cervical esophagus and in the thoracic esophagus. The diagnosis is squamous cell carcinoma. This was staged T3 N1/N2 Mx by endosonographic criteria. - No specimens collected.  EUS 03/10/2016 DR. HUNG  the esohpagus was thickened starting around 23 cm and it extended down in to the lower esophagus. Where the tumor was the thickest, prior to treatment, there was a marked reduction in  volume. The thickness decreased from 12.8 mm down to 6-7.7 mm. Again, restaging post treatment is not the most reliable, however, it appears to be a T3. I cannot differentiate between actual tumor versus fibrosis/inflammation. Some peritumoral shoddy lymph  nodes were noted. IMPRESSION:  - Benign-appearing esophageal stenosis. - Normal stomach. - Normal examined duodenum. - Wall thickening was seen in the thoracic esophagus. - No specimens collected.    ASSESSMENT & PLAN: 76 year old gentleman, without significant past medical history except acid reflux, presented with progressive dysphagia and odynophagia and weight loss.  1. Esophageal cancer, squamous cell carcinoma, cT3N1-2M0, stage IIIB -I previously reviewed his EGD findings, biopsy results, and the CT scan findings with patient and his wife in great details -I previously dicussed staging PET scan findings with patient and his wife, the mid esophageal mass is hypermetabolic, no distant metastasis. -EUS revealed a T3 lesion, N1 vs N2, locally advanced disease. 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 Servando Snare has mutually agreed not to pursue esophagectomy due to his advanced age and general health condition -We previously discussed that his esophageal cancer is unlikely cured by chemoradiation. I reviewed his repeated EUS after his chemotherapy and irradiation, which showed good response to chemoradiation, but possible residual tumor. - I again discussed with the patient that the chance of reoccurrence is very high if he does not have surgery.  - labs reviewed.  - follow up in 2 months with restaging PET  - I will message Dr. Benson Norway and Dr. Servando Snare for endoscopy surveillance - we will continue monitoring    2. Dysphagia, resolved  -His dysphagia has mostly resolved after chemotherapy and  irradiation -follow up with dietician Pamala Hurry as needed -He has started gaining some weight back  3. Steroids induced psychosis -Resolved now. We'll avoid steroids in the future.  4. Leukopenia and anemia -Secondary to chemotherapy radiation -Leukopenia has resolved, he still has mild anemia, overall stable. -We'll continue monitoring, no need for blood transfusion  5. Chronic back pain -I previously encouraged the patient to follow with primary care physician for back pain -I will not refill his narcotics. -She has developed some hip pain, which she contributes to arthritis. I encourage him to call me if the pain gets worse.   Recommendations - Labs reviewed today - follow up in 2 months with PET scan - message Dr. Benson Norway and Dr. Servando Snare for repeating endoscopy   All questions were answered. The patient knows to call the clinic with any problems, questions or concerns.  I spent 20 minutes counseling the patient face to face. The total time spent in the appointment was 25 minutes and more than 50% was on counseling.  This document serves as a record of services personally performed by [name], MD. It was created on her behalf by Brandt Loosen, a trained medical scribe. The creation of this record is based on the scribe's personal observations and the provider's statements to them. This document has been checked and approved by the attending provider.    [name], MD 09/04/2016

## 2016-09-04 ENCOUNTER — Encounter: Payer: Self-pay | Admitting: Hematology

## 2016-09-04 ENCOUNTER — Ambulatory Visit (HOSPITAL_BASED_OUTPATIENT_CLINIC_OR_DEPARTMENT_OTHER): Payer: Managed Care, Other (non HMO) | Admitting: Hematology

## 2016-09-04 ENCOUNTER — Other Ambulatory Visit: Payer: Self-pay

## 2016-09-04 ENCOUNTER — Ambulatory Visit (HOSPITAL_BASED_OUTPATIENT_CLINIC_OR_DEPARTMENT_OTHER): Payer: Managed Care, Other (non HMO)

## 2016-09-04 ENCOUNTER — Other Ambulatory Visit (HOSPITAL_BASED_OUTPATIENT_CLINIC_OR_DEPARTMENT_OTHER): Payer: Managed Care, Other (non HMO)

## 2016-09-04 ENCOUNTER — Ambulatory Visit: Payer: Self-pay | Admitting: Hematology

## 2016-09-04 VITALS — BP 160/87 | HR 63 | Temp 97.9°F | Resp 18 | Ht 69.0 in | Wt 200.4 lb

## 2016-09-04 DIAGNOSIS — C154 Malignant neoplasm of middle third of esophagus: Secondary | ICD-10-CM

## 2016-09-04 DIAGNOSIS — D6481 Anemia due to antineoplastic chemotherapy: Secondary | ICD-10-CM

## 2016-09-04 DIAGNOSIS — D701 Agranulocytosis secondary to cancer chemotherapy: Secondary | ICD-10-CM | POA: Diagnosis not present

## 2016-09-04 DIAGNOSIS — Z452 Encounter for adjustment and management of vascular access device: Secondary | ICD-10-CM | POA: Diagnosis not present

## 2016-09-04 DIAGNOSIS — Z95828 Presence of other vascular implants and grafts: Secondary | ICD-10-CM

## 2016-09-04 LAB — CBC WITH DIFFERENTIAL/PLATELET
BASO%: 0.7 % (ref 0.0–2.0)
Basophils Absolute: 0 10*3/uL (ref 0.0–0.1)
EOS%: 1.7 % (ref 0.0–7.0)
Eosinophils Absolute: 0.1 10*3/uL (ref 0.0–0.5)
HCT: 34.6 % — ABNORMAL LOW (ref 38.4–49.9)
HGB: 10.9 g/dL — ABNORMAL LOW (ref 13.0–17.1)
LYMPH%: 22.1 % (ref 14.0–49.0)
MCH: 22.3 pg — ABNORMAL LOW (ref 27.2–33.4)
MCHC: 31.4 g/dL — ABNORMAL LOW (ref 32.0–36.0)
MCV: 71 fL — ABNORMAL LOW (ref 79.3–98.0)
MONO#: 0.4 10*3/uL (ref 0.1–0.9)
MONO%: 9.8 % (ref 0.0–14.0)
NEUT#: 2.4 10*3/uL (ref 1.5–6.5)
NEUT%: 65.7 % (ref 39.0–75.0)
Platelets: 236 10*3/uL (ref 140–400)
RBC: 4.87 10*6/uL (ref 4.20–5.82)
RDW: 15.9 % — ABNORMAL HIGH (ref 11.0–14.6)
WBC: 3.7 10*3/uL — ABNORMAL LOW (ref 4.0–10.3)
lymph#: 0.8 10*3/uL — ABNORMAL LOW (ref 0.9–3.3)

## 2016-09-04 LAB — COMPREHENSIVE METABOLIC PANEL
ALT: 22 U/L (ref 0–55)
AST: 24 U/L (ref 5–34)
Albumin: 3.7 g/dL (ref 3.5–5.0)
Alkaline Phosphatase: 87 U/L (ref 40–150)
Anion Gap: 8 mEq/L (ref 3–11)
BUN: 6.8 mg/dL — ABNORMAL LOW (ref 7.0–26.0)
CO2: 25 mEq/L (ref 22–29)
Calcium: 9.3 mg/dL (ref 8.4–10.4)
Chloride: 106 mEq/L (ref 98–109)
Creatinine: 0.9 mg/dL (ref 0.7–1.3)
EGFR: >90 mL/min/{1.73_m2} (ref >90)
Glucose: 104 mg/dl (ref 70–140)
Potassium: 4 mEq/L (ref 3.5–5.1)
Sodium: 139 mEq/L (ref 136–145)
Total Bilirubin: 0.39 mg/dL (ref 0.20–1.20)
Total Protein: 7.1 g/dL (ref 6.4–8.3)

## 2016-09-04 MED ORDER — HEPARIN SOD (PORK) LOCK FLUSH 100 UNIT/ML IV SOLN
500.0000 [IU] | Freq: Once | INTRAVENOUS | Status: AC | PRN
  Administered 2016-09-04: 500 [IU] via INTRAVENOUS
  Filled 2016-09-04: qty 5

## 2016-09-04 MED ORDER — SODIUM CHLORIDE 0.9 % IJ SOLN
10.0000 mL | INTRAMUSCULAR | Status: DC | PRN
  Administered 2016-09-04: 10 mL via INTRAVENOUS
  Filled 2016-09-04: qty 10

## 2016-09-06 ENCOUNTER — Encounter: Payer: Self-pay | Admitting: Hematology

## 2016-09-07 ENCOUNTER — Telehealth: Payer: Self-pay | Admitting: Hematology

## 2016-09-07 NOTE — Telephone Encounter (Signed)
Patient bypassed scheduling on 09/04/16.  Appointments scheduled per 09/04/16 los. Appoints confirmed with patient.

## 2016-09-08 ENCOUNTER — Telehealth: Payer: Self-pay

## 2016-09-08 ENCOUNTER — Other Ambulatory Visit: Payer: Self-pay | Admitting: *Deleted

## 2016-09-08 ENCOUNTER — Telehealth: Payer: Self-pay | Admitting: Radiation Oncology

## 2016-09-08 ENCOUNTER — Telehealth: Payer: Self-pay | Admitting: *Deleted

## 2016-09-08 DIAGNOSIS — C154 Malignant neoplasm of middle third of esophagus: Secondary | ICD-10-CM

## 2016-09-08 MED ORDER — PROCHLORPERAZINE MALEATE 10 MG PO TABS: 10.0000 mg | ORAL_TABLET | Freq: Four times a day (QID) | ORAL | 1 refills | Status: DC | PRN

## 2016-09-08 NOTE — Telephone Encounter (Signed)
Pt returned call. He states that a few times each month he will have nausea in AM before breakfast and he will use compazine and it helps him. Compazine refilled.

## 2016-09-08 NOTE — Telephone Encounter (Signed)
[facility] called about refilling compazine. OV note of 6/8 states no nausea and last refill was October 2017. Called pt and LVM to call us back to verify he needs compazine, and if he is having new nausea.

## 2016-09-08 NOTE — Telephone Encounter (Signed)
The patient was just seen by Dr. Burr Medico last Friday and she has plans to repeat PET scan. He recently had a CTPA for chest pain and an otherwise negative work up in the ED. His CTPA did not show clot but showed opacity concerning for possible early pneumonia and he was treated with antibiotics. As he just saw Dr. Burr Medico last week, I don't believe there's a need to see me tomorrow. Rather, we can revisit his status this fall. I explained my goal was not to waste his time for a similar assessment he just had with Dr. Burr Medico. He is in agreement. We will cancel tomorrow's appointment and reschedule it for later this year. He will call if he has further questions. He continues to have some chest pain and was encouraged to be evaluated by his PCP, or by the ED again if he has acute onset of symptoms.

## 2016-09-08 NOTE — Telephone Encounter (Signed)
CALLED PATIENT TO ALTER FU VISIT FOR 09-09-16 PER ALISON PERKINS, RESCHEDULED FOR 12-09-16 @ 9:30 AM, PATIENT AGREED TO NEW DATE AND TIME

## 2016-09-09 ENCOUNTER — Ambulatory Visit
Admission: RE | Admit: 2016-09-09 | Discharge: 2016-09-09 | Disposition: A | Discharge status: Home / Self Care | Payer: Managed Care, Other (non HMO) | Source: Ambulatory Visit | Attending: Radiation Oncology | Admitting: Radiation Oncology

## 2016-10-01 ENCOUNTER — Encounter: Payer: Self-pay | Admitting: Cardiothoracic Surgery

## 2016-10-08 ENCOUNTER — Encounter: Payer: Self-pay | Admitting: Cardiothoracic Surgery

## 2016-10-12 ENCOUNTER — Ambulatory Visit (INDEPENDENT_AMBULATORY_CARE_PROVIDER_SITE_OTHER): Payer: Managed Care, Other (non HMO) | Admitting: Cardiothoracic Surgery

## 2016-10-12 ENCOUNTER — Encounter: Payer: Self-pay | Admitting: Cardiothoracic Surgery

## 2016-10-12 VITALS — BP 147/92 | HR 62 | Resp 20 | Ht 69.0 in | Wt 197.6 lb

## 2016-10-12 DIAGNOSIS — C159 Malignant neoplasm of esophagus, unspecified: Secondary | ICD-10-CM | POA: Diagnosis not present

## 2016-10-12 DIAGNOSIS — Z923 Personal history of irradiation: Secondary | ICD-10-CM

## 2016-10-12 DIAGNOSIS — Z9221 Personal history of antineoplastic chemotherapy: Secondary | ICD-10-CM | POA: Diagnosis not present

## 2016-10-12 NOTE — Progress Notes (Signed)
[address]       [place] [address]             [phone]                    Ibrohim Gondek [place] Medical Record #[id number] Date of Birth: [birth year]  Referring: [name], MD Primary Care: [name], [name]), MD (Inactive)  Chief Complaint:    Chief Complaint  Patient presents with  . Esophageal Cancer    3 month f/u , CTA Chest 08/19/16   Cancer Staging Esophageal cancer [facility] Staging form: Esophagus - Adenocarcinoma, AJCC 7th Edition - Clinical stage from 11/13/2015: Stage IIIB (T3, N2, M0, G2) - Signed by [name], MD on 12/11/2015   History of Present Illness:   Patient returns to the office for follow-up check, is treated for mid esophageal squamous cell cancer his therapy was completed in October 2017, after several consultations the patient elected not to proceed with surgical resection. He notes he is currently doing well taking a by mouth diet without difficulty and remains active. He notes he is now able to eat both meat and bread without difficulty.  He has been followed for  Esophageal cancer-squamous cell -[place] [id number]. The patient noted a "sore throat and painful swallowing for more than a year, associated with weight loss from 196-171. While living in [place] he had been told that he had Barrett's esophagus and had had episodic endoscopies, but I have no reports at this.he saw Dr. Collene Mares for a follow-up colonoscopy, at that time a upper GI endoscopy was performed. A second endoscopy was performed with esophageal ultrasound as noted below :   A mass was found in the cervical esophagus and in the thoracic esophagus. The diagnosis is squamous cell carcinoma. This was staged T3 N1/N2 Mx by endosonographic criteria. From the descriptions of endoscopy is not clear to me if this patient has extensive squamous cell carcinoma involving the cervical and mid thoracic esophagus or 2 separate lesions.    He's completed radiation    therapy on October 18  and chemotherapy  on January 14 2016      THERAPY: concurrent chemotherapy and radiation, with weekly carboplatin AUC 2 and Taxol 45 mg/m, started on 12/10/2015 and ended October 17   Current Activity/ Functional Status:  Patient is independent with mobility/ambulation, transfers, ADL's, IADL's.   Zubrod Score: At the time of surgery this patient's most appropriate activity status/level should be described as: []     0    Normal activity, no symptoms [x]     1    Restricted in physical strenuous activity but ambulatory, able to do out light work []     2    Ambulatory and capable of self care, unable to do work activities, up and about               >50 % of waking hours                              []     3    Only limited self care, in bed greater than 50% of waking hours []     4    Completely disabled, no self care, confined to bed or chair []     5    Moribund   Past Medical History:  Diagnosis Date  . Diabetes mellitus without complication ([place])   .  Esophageal cancer ([place])   . LBBB (left bundle branch block) 02/11/2016  . Reflux     Past Surgical History:  Procedure Laterality Date  . EUS N/A 12/05/2015   Procedure: UPPER ENDOSCOPIC ULTRASOUND (EUS) LINEAR;  Surgeon: Carol Ada, MD;  Location: [facility];  Service: Endoscopy;  Laterality: N/A;  . EUS N/A 03/10/2016   Procedure: UPPER ENDOSCOPIC ULTRASOUND (EUS) LINEAR;  Surgeon: Carol Ada, MD;  Location: [facility];  Service: Endoscopy;  Laterality: N/A;  . HERNIA REPAIR    . IR GENERIC HISTORICAL  12/24/2015   IR FLUORO GUIDE PORT INSERTION RIGHT 12/24/2015 Arne Cleveland, MD [facility]  . IR GENERIC HISTORICAL  12/24/2015   IR US GUIDE VASC ACCESS RIGHT 12/24/2015 Arne Cleveland, MD [facility]  . SHOULDER ARTHROSCOPY W/ SUPERIOR LABRAL ANTERIOR POSTERIOR LESION REPAIR      Family History  Problem Relation Age of Onset  . Colon cancer Father 77  . Colon cancer Brother 10  . Heart attack  Brother   . Heart disease Mother   . Heart attack Mother   . Diabetes Sister   . Stroke Sister     Social History   Social History  . Marital status: Married    Spouse name: N/A  . Number of children: N/A  . Years of education: N/A   Occupational History  . Not on file.   Social History Main Topics  . Smoking status: Former Smoker    Packs/day: 2.00    Years: 40.00    Quit date: 03/31/1999  . Smokeless tobacco: Never Used  . Alcohol use No     Comment: weekend binge drinking for 30-40 years, quit in 2001  . Drug use: No  . Sexual activity: Not on file   Other Topics Concern  . 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 Former Smoker  . Packs/day: 2.00  . Years: 40.00  . Quit date: 03/31/1999  Smokeless Tobacco  . Never Used    History  Alcohol Use No    Comment: weekend binge drinking for 30-40 years, quit in 2001     No Known Allergies  Current Outpatient Prescriptions  Medication Sig Dispense Refill  . alfuzosin (UROXATRAL) 10 MG 24 hr tablet Take 10 mg by mouth daily with breakfast.    . amitriptyline (ELAVIL) 10 MG tablet Take 10 mg by mouth at bedtime.     Marland Kitchen aspirin EC 81 MG tablet Take 81 mg by mouth daily.    Marland Kitchen atorvastatin (LIPITOR) 20 MG tablet Take 20 mg by mouth at bedtime.     . metFORMIN (GLUCOPHAGE) 500 MG tablet Take 500 mg by mouth 2 (two) times daily with a meal. States takes sometimes    . oxyCODONE (ROXICODONE) 15 MG immediate release tablet Take 1 tablet (15 mg total) by mouth every 6 (six) hours as needed for pain. 30 tablet 0  . pantoprazole (PROTONIX) 40 MG tablet Take 1 tablet (40 mg total) by mouth daily. 30 tablet 0  . predniSONE (DELTASONE) 20 MG tablet Two daily with food 10 tablet 0  . prochlorperazine (COMPAZINE) 10 MG tablet Take 1 tablet (10 mg total) by mouth every 6 (six) hours as needed (Nausea or vomiting). 30  tablet 1  . sodium chloride (OCEAN) 0.65 % SOLN nasal spray Place 1 spray into both nostrils as needed for  congestion. 30 mL 2  . sucralfate (CARAFATE) 1 g tablet DISSOLVE 1 TAB IN 1-2 TABLESPOONS OF WATER AND TAKE BEFORE MEALS AND AT BEDTIME 360 tablet 0  . UNKNOWN TO PATIENT Water pill daily     No current facility-administered medications for this visit.       Review of Systems:     Cardiac Review of Systems: Y or N  Chest Pain [ n   ]  Resting SOB [ n ] Exertional SOB  Blue.Reese  ]  Orthopnea Florencio.Farrier ]   Pedal Edema [ n]    Palpitations Florencio.Farrier  ] Syncope  Florencio.Farrier  ]   Presyncope Florencio.Farrier  ]  General Review of Systems: [Y] = yes [  ]=no Constitional: recent weight change [n  ];  Wt loss over the last 3 months Blue.Reese   ] anorexia [  ]; fatigue [ y ]; nausea [ y ]; night sweats [ n ]; fever [  ]; or chills [n ];          Dental: poor dentition[  ]; Last Dentist visit:   Eye : blurred vision [  ]; diplopia [   ]; vision changes [  ];  Amaurosis fugax[  ]; Resp: cough [  ];  wheezing[ n ];  hemoptysis[ n ]; shortness of breath[ n ]; paroxysmal nocturnal dyspnea[  ]; dyspnea on exertion[  ]; or orthopnea[  ];  GI:  gallstones[  ], vomiting[n  ];  dysphagia[y  ]; melena[ n ];  hematochezia [n  ]; heartburn[ n ];   Hx of  Colonoscopy[  ]; GU: kidney stones [  ]; hematuria[  ];   dysuria [  ];  nocturia[  ];  history of     obstruction [  ]; urinary frequency [  ]             Skin: rash, swelling[  ];, hair loss[  ];  peripheral edema[  ];  or itching[  ]; Musculosketetal: myalgias[  ];  joint swelling[  ];  joint erythema[  ];  joint pain[  ];  back pain[  ];  Heme/Lymph: bruising[  ];  bleeding[  ];  anemia[  ];  Neuro: TIA[  ];  headaches[  ];  stroke[  ];  vertigo[  ];  seizures[ n ];   paresthesias[  ];  difficulty walking[  ];  Psych:depression[  ]; anxiety[  ];  Endocrine: diabetes[n  ];  thyroid dysfunction[ n ];  Immunizations: Flu up to date [ y ]; Pneumococcal up to date [ n ];  Other:  Physical Exam: BP (!)  147/92   Pulse 62   Resp 20   Ht 5\' 9"  (1.753 m)   Wt 197 lb 9.6 oz (89.6 kg)   SpO2 99% Comment: RA  BMI 29.18 kg/m   PHYSICAL EXAMINATION:   No cervical adenopathy  General appearance: alert and cooperative Head: Normocephalic, without obvious abnormality, atraumatic Neck: no adenopathy, no carotid bruit, no JVD, supple, symmetrical, trachea midline and thyroid not enlarged, symmetric, no tenderness/mass/nodules Lymph nodes: Cervical, supraclavicular, and axillary nodes normal. Resp: clear to auscultation bilaterally Back: symmetric, no curvature. ROM normal. No CVA tenderness. Cardio: regular rate and rhythm, S1, S2 normal, no murmur, click, rub or gallop GI: soft, non-tender; bowel sounds normal; no masses,  no organomegaly Extremities: extremities normal, atraumatic, no cyanosis or edema and Homans sign is negative, no sign of DVT Neurologic: Grossly normal  Diagnostic Studies & Laboratory data:  Recent Radiology Findings:  CLINICAL DATA:  Dyspnea. History of reflux, esophageal cancer, diabetes  EXAM: CT ANGIOGRAPHY CHEST WITH CONTRAST  TECHNIQUE: Multidetector CT imaging of the chest was performed using the standard protocol during bolus administration of intravenous contrast. Multiplanar CT image reconstructions and MIPs were obtained to evaluate the vascular anatomy.  CONTRAST:  79 mL Isovue 370  COMPARISON:  06/01/2016  FINDINGS: Cardiovascular: Satisfactory opacification of the pulmonary arteries to the segmental level. No evidence of pulmonary embolism. Normal heart size. No pericardial effusion.  Mediastinum/Nodes: No significant lymphadenopathy in the chest. Esophagus is decompressed. Difficult to evaluate for residual wall thickness due to decompression. Thyroid gland is unremarkable. Right central venous catheter with tip in the superior vena cava.  Lungs/Pleura: Evaluation is limited due to motion artifact. Hazy opacities in both lungs,  more prominent posteriorly, suggesting edema or pneumonia. This is new since previous study. No pleural effusions. No pneumothorax. Airways appear patent. Small bulla in the anterior left lung.  Upper Abdomen: Calcified granuloma in the spleen.  Musculoskeletal: Degenerative changes in the spine. No destructive bone lesions.  Review of the MIP images confirms the above findings.  IMPRESSION: No evidence of significant pulmonary embolus. Hazy opacities in both lungs suggesting edema or pneumonia, new since previous study.   Electronically Signed   By: Lucienne Capers M.D.   On: 08/19/2016 03:25  I have independently reviewed the above radiology studies  and reviewed the findings with the patient.   Ct Chest W Contrast & Ct Abdomen Pelvis W Contrast  Result Date: 02/24/2016 CLINICAL DATA:  Restaging esophageal carcinoma EXAM: CT CHEST, ABDOMEN, AND PELVIS WITH CONTRAST TECHNIQUE: Multidetector CT imaging of the chest, abdomen and pelvis was performed following the standard protocol during bolus administration of intravenous contrast. CONTRAST:  110mL ISOVUE-300 IOPAMIDOL (ISOVUE-300) INJECTION 61% COMPARISON:  PET-CT from 12/03/2015 FINDINGS: CT CHEST FINDINGS Cardiovascular: The heart size is normal. Aortic atherosclerosis identified. LAD and left circumflex coronary artery calcifications identified. Mediastinum/Nodes: No mediastinal or hilar adenopathy identified. Circumferential wall thickening involving the mid thoracic esophagus is again noted. This appears improved from previous exam. At the level of the aortic arch the esophagus, at its thickest, measures 1.8 x 1.8 cm, image 16 of series 2. Previously 2.6 x 3.3 cm. Lungs/Pleura: No pleural effusion identified. Mild changes of centrilobular emphysema no suspicious pulmonary nodules identified. Musculoskeletal: Degenerative disc disease is identified within the thoracic spine. No chest wall mass or suspicious bone lesions  identified. CT ABDOMEN PELVIS FINDINGS Hepatobiliary: No focal liver abnormality is seen. No gallstones, gallbladder wall thickening, or biliary dilatation. Pancreas: Unremarkable. No pancreatic ductal dilatation or surrounding inflammatory changes. Spleen: Calcified granulomas identified within the spleen. Adrenals/Urinary Tract: The adrenal glands are normal. Parapelvic cyst identified within the left kidney measuring 1.4 cm, image 58 of series 2. Urinary bladder appears normal. Stomach/Bowel: The stomach is normal. The small bowel loops have a normal course and caliber. No obstruction. Unremarkable appearance of the colon. Vascular/Lymphatic: Aortic atherosclerosis. No enlarged upper abdominal lymph nodes. No pelvic or inguinal adenopathy. Reproductive: Prostate is unremarkable. Other: There is no ascites or focal fluid collections within the abdomen or pelvis. Musculoskeletal: No suspicious bone lesions identified. Spondylosis noted within the lumbar spine. IMPRESSION: 1. Interval improvement and mid esophageal mass. 2. No evidence for metastatic adenopathy or distant metastatic disease. 3. Aortic atherosclerosis and coronary artery calcification. Electronically Signed   By: Kerby Moors M.D.   On: 02/24/2016 16:33  Ct Head Wo Contrast  Result Date: 12/19/2015 CLINICAL  DATA:  Patient with history of psychiatric issues. History of esophageal carcinoma. EXAM: CT HEAD WITHOUT CONTRAST TECHNIQUE: Contiguous axial images were obtained from the base of the skull through the vertex without intravenous contrast. COMPARISON:  Brain CT 01/05/2014 FINDINGS: Brain: Ventricles and sulci are appropriate for patient's age. No evidence for acute cortically based infarct, intracranial hemorrhage, mass lesion or mass-effect. Vascular: No hyperdense vessel or unexpected calcification. Skull: Normal. Negative for fracture or focal lesion. Sinuses/Orbits: No acute finding. Other: None. IMPRESSION: No acute intracranial process.  Electronically Signed   By: Lovey Newcomer M.D.   On: 12/19/2015 15:51   Nm Pet Image Initial (pi) Skull Base To Thigh  Result Date: 12/03/2015 CLINICAL DATA:  Initial treatment strategy for esophageal cancer of the middle third of the esophagus. EXAM: NUCLEAR MEDICINE PET SKULL BASE TO THIGH TECHNIQUE: 12.7 mCi F-18 FDG was injected intravenously. Full-ring PET imaging was performed from the skull base to thigh after the radiotracer. CT data was obtained and used for attenuation correction and anatomic localization. FASTING BLOOD GLUCOSE:  Value: 11 mg/dl COMPARISON:  CT abdomen 11/13/2015 FINDINGS: NECK No hypermetabolic lymph nodes in the neck. CHEST A 3 cm segment of circumferential esophageal wall thickening at the level of the carina with intense metabolic activity (SUV max equal 18.2). Single wall thickness measures 1.5 cm High RIGHT paratracheal lymph node measures 7 mm (image 964, series 4) with low metabolic activity the hypermetabolic activity (SUV max equaled 2.1). No hypermetabolic paraesophageal lymph nodes. No suspicious pulmonary nodules. ABDOMEN/PELVIS No hypermetabolic gastrohepatic ligament nodes. No abnormal metabolic activity in the liver. No hypermetabolic retroperitoneal or pelvic lymph nodes. Physiologic activity noted within the colon. SKELETON No focal hypermetabolic activity to suggest skeletal metastasis. IMPRESSION: 1. Long segment of hypermetabolic thickening in the mid esophagus consistent with esophageal carcinoma. 2. No clear evidence of mediastinal nodal metastasis. Single high RIGHT paratracheal lymph node with low metabolic activity 3. No evidence of liver metastasis or upper abdominal nodal metastasis Electronically Signed   By: Suzy Bouchard M.D.   On: 12/03/2015 10:53        Recent Lab Findings: Lab Results  Component Value Date   WBC 3.7 (L) 09/04/2016   HGB 10.9 (L) 09/04/2016   HCT 34.6 (L) 09/04/2016   PLT 236 09/04/2016   GLUCOSE 104 09/04/2016   ALT 22  09/04/2016   AST 24 09/04/2016   NA 139 09/04/2016   K 4.0 09/04/2016   CL 106 08/18/2016   CREATININE 0.9 09/04/2016   BUN 6.8 (L) 09/04/2016   CO2 25 09/04/2016   INR 1.05 04/14/2016   Findings: A large, ulcerating mass with no bleeding and stigmata of recent bleeding was found in the upper third of the esophagus and in the middle third of the esophagus. The mass was non-obstructing and partially circumferential (involving one-half of the lumen circumference). Endosonographic Finding A mass was found in the cervical esophagus and in the thoracic esophagus. The procedure was difficult to perform as a result of patient tolerance issues combined with rapid HR and severe HTN. In the upper to mid esophagus a large protruding mass was identified. The surrounding mucosa was friable and ulcerated. There was some initial difficulty with passing the radial echoendoscope through this area. Because of the patient's combativeness and the protrusion of the mass I was not able to obtain an accurate measurement of the mass with endoscopic visualization. Evaluation with ultrasound was challenging, but a large eccentric hypoechoic mass with irregular borders was identified. In the  proximal portion it appeared to be noncircumfirential, but in the mid portion and distally the lesion eccentrically circumfirential. At the largest portion of the mass, it measured 12.8 mm in depth. At this point, the mass protruded beyond the adventitia (T3). In the area it was not clear to me if the mass extended or if it was a collection of lymph nodes. A couple of peritumoral lymph nodes were identified just proximal to this area. Evaluation of the Celiac axis was negative for any evidence of lymph nodes. The left lobe of the liver was negative for any suspicious lesions and the left adrenal was normal in appearance. - Malignant esophageal tumor was found in the upper third of the esophagus and in the middle third of the  esophagus. - A mass was found in the cervical esophagus and in the thoracic esophagus. The diagnosis is squamous cell carcinoma. This was staged T3 N1/N2 Mx by endosonographic criteria. - No specimens collected  Repeat EUS: 03/10/2016 Findings: One moderate benign-appearing, intrinsic stenosis was found 25 cm from the incisors. This measured 1.1 cm (inner diameter) x 1 cm (in length) and was traversed. The entire examined stomach was endoscopically normal. The examined duodenum was endoscopically normal. Endosonographic Finding Localized wall thickening was visualized endosonographically in the thoracic esophagus. The thickness of the abnormal layers measured 6 mm. The esophageal wall measured up to 8 mm in total thickness. Gross visualization of the esophagus revealed a marked improvement. There was no gross evidence of any mass in the esophageal lumen. Stenosis was encountered at 25 cm, but with gentle pressure the echoendoscope was able to be maneuvered through the area. A mild dilation of the mucosa occurred. With the ultrasound view, the esohpagus was thickened starting around 23 cm and it extended down in to the lower esophagus. Where the tumor was the thickest, prior to treatment, there was a marked reduction in volume. The thickness decreased from 12.8 mm down to 6-7.7 mm. Again, restaging post treatment is not the most reliable, however, it appears to be a T3. I cannot differentiate between actual tumor versus fibrosis/inflammation. Some peritumoral shoddy lymph nodes were noted.  Assessment / Plan:   Patient with advanced squamous cell carcinoma of the esophagus, he is undergone treatment with radiation and chemotherapy with improvement of symptoms and swallowing ability. He was  evaluated for possible esophageal resection and ultimately decided not to proceed with resection. He continues to be doing well without evidence of recurrence. Plan to see him back in 3 months.  Grace Isaac MD      [address] [place] [address] Office [phone]   Beeper [phone]  10/12/2016 5:06 PM

## 2016-10-29 DIAGNOSIS — R600 Localized edema: Secondary | ICD-10-CM | POA: Diagnosis not present

## 2016-10-29 DIAGNOSIS — R0602 Shortness of breath: Secondary | ICD-10-CM | POA: Diagnosis not present

## 2016-10-29 DIAGNOSIS — M5136 Other intervertebral disc degeneration, lumbar region: Secondary | ICD-10-CM | POA: Diagnosis not present

## 2016-10-29 DIAGNOSIS — R7301 Impaired fasting glucose: Secondary | ICD-10-CM | POA: Diagnosis not present

## 2016-10-30 DIAGNOSIS — I82403 Acute embolism and thrombosis of unspecified deep veins of lower extremity, bilateral: Secondary | ICD-10-CM | POA: Diagnosis not present

## 2016-11-04 ENCOUNTER — Other Ambulatory Visit (HOSPITAL_BASED_OUTPATIENT_CLINIC_OR_DEPARTMENT_OTHER): Payer: Managed Care, Other (non HMO)

## 2016-11-04 DIAGNOSIS — C159 Malignant neoplasm of esophagus, unspecified: Secondary | ICD-10-CM

## 2016-11-04 DIAGNOSIS — C154 Malignant neoplasm of middle third of esophagus: Secondary | ICD-10-CM

## 2016-11-04 LAB — CBC WITH DIFFERENTIAL/PLATELET
BASO%: 0 % (ref 0.0–2.0)
Basophils Absolute: 0 10*3/uL (ref 0.0–0.1)
EOS%: 0.5 % (ref 0.0–7.0)
Eosinophils Absolute: 0 10*3/uL (ref 0.0–0.5)
HCT: 38.2 % — ABNORMAL LOW (ref 38.4–49.9)
HGB: 12 g/dL — ABNORMAL LOW (ref 13.0–17.1)
LYMPH%: 19.9 % (ref 14.0–49.0)
MCH: 22.6 pg — ABNORMAL LOW (ref 27.2–33.4)
MCHC: 31.4 g/dL — ABNORMAL LOW (ref 32.0–36.0)
MCV: 71.8 fL — ABNORMAL LOW (ref 79.3–98.0)
MONO#: 0.4 10*3/uL (ref 0.1–0.9)
MONO%: 6.5 % (ref 0.0–14.0)
NEUT#: 4.2 10*3/uL (ref 1.5–6.5)
NEUT%: 73.1 % (ref 39.0–75.0)
Platelets: 253 10*3/uL (ref 140–400)
RBC: 5.32 10*6/uL (ref 4.20–5.82)
RDW: 16.8 % — ABNORMAL HIGH (ref 11.0–14.6)
WBC: 5.7 10*3/uL (ref 4.0–10.3)
lymph#: 1.1 10*3/uL (ref 0.9–3.3)

## 2016-11-04 LAB — COMPREHENSIVE METABOLIC PANEL
ALT: 31 U/L (ref 0–55)
AST: 17 U/L (ref 5–34)
Albumin: 3.8 g/dL (ref 3.5–5.0)
Alkaline Phosphatase: 84 U/L (ref 40–150)
Anion Gap: 10 mEq/L (ref 3–11)
BUN: 11.3 mg/dL (ref 7.0–26.0)
CO2: 24 mEq/L (ref 22–29)
Calcium: 9.1 mg/dL (ref 8.4–10.4)
Chloride: 104 mEq/L (ref 98–109)
Creatinine: 1 mg/dL (ref 0.7–1.3)
EGFR: 88 mL/min/{1.73_m2} — ABNORMAL LOW (ref >90)
Glucose: 133 mg/dl (ref 70–140)
Potassium: 3.8 mEq/L (ref 3.5–5.1)
Sodium: 139 mEq/L (ref 136–145)
Total Bilirubin: 0.26 mg/dL (ref 0.20–1.20)
Total Protein: 7.1 g/dL (ref 6.4–8.3)

## 2016-11-06 ENCOUNTER — Telehealth: Payer: Self-pay | Admitting: *Deleted

## 2016-11-06 ENCOUNTER — Telehealth: Payer: Self-pay | Admitting: Hematology

## 2016-11-06 ENCOUNTER — Encounter: Payer: Self-pay | Admitting: *Deleted

## 2016-11-06 ENCOUNTER — Ambulatory Visit: Payer: Self-pay | Admitting: Hematology

## 2016-11-06 NOTE — Progress Notes (Signed)
[facility] Work  Clinical Social Work was referred by patient and family for completion of ADRs/HCPOA forms.  Clinical Social Worker had planned to meet with pt and wife to complete these documents today. They called to cancel and plan to reschedule at a later date. CSW encouraged them to reach out when ready and CSW team would be glad to assist.       Loren Racer, LCSW, OSW-C Clinical Social Worker [place]  [facility] Phone: [phone] Fax: [phone]

## 2016-11-06 NOTE — Telephone Encounter (Signed)
Received vm from pt requesting a call back.  TCT patient,  Spoke with patient. He states someone from the cancer center called him. Informed him that Scheduling had called to re-schedule his appt with Dr. Burr Medico as he has not had his PET scan yet. PET scan had not been scheduled. Confirmed authorization prior to calling pt. Also called Central Scheduling prior to call ing pt so that they can get in touch with pt to schedule his PET Scan prior to 11/13/16 appt with Dr. Burr Medico.  Made pt aware of the above. He voiced understanding.  Reviewed schedule 45 minutes after speaking with pt. Scan is now scheduled for 11/07/16

## 2016-11-06 NOTE — Telephone Encounter (Signed)
sw pt to confirm r/s appt to 8/17 at 330 per sch msg due to PET not scheduled yet

## 2016-11-07 ENCOUNTER — Encounter (HOSPITAL_COMMUNITY)
Admission: RE | Admit: 2016-11-07 | Discharge: 2016-11-07 | Disposition: A | Discharge status: Home / Self Care | Payer: Managed Care, Other (non HMO) | Source: Ambulatory Visit | Attending: Hematology | Admitting: Hematology

## 2016-11-07 DIAGNOSIS — C159 Malignant neoplasm of esophagus, unspecified: Secondary | ICD-10-CM | POA: Diagnosis not present

## 2016-11-07 DIAGNOSIS — C154 Malignant neoplasm of middle third of esophagus: Secondary | ICD-10-CM | POA: Insufficient documentation

## 2016-11-07 LAB — GLUCOSE, CAPILLARY: Glucose-Capillary: 151 mg/dL — ABNORMAL HIGH (ref 65–99)

## 2016-11-07 MED ORDER — FLUDEOXYGLUCOSE F - 18 (FDG) INJECTION
11.2000 | Freq: Once | INTRAVENOUS | Status: AC | PRN
  Administered 2016-11-07: 11.2 via INTRAVENOUS

## 2016-11-09 DIAGNOSIS — R7301 Impaired fasting glucose: Secondary | ICD-10-CM | POA: Diagnosis not present

## 2016-11-09 DIAGNOSIS — G609 Hereditary and idiopathic neuropathy, unspecified: Secondary | ICD-10-CM | POA: Diagnosis not present

## 2016-11-09 DIAGNOSIS — E782 Mixed hyperlipidemia: Secondary | ICD-10-CM | POA: Diagnosis not present

## 2016-11-12 DIAGNOSIS — R7301 Impaired fasting glucose: Secondary | ICD-10-CM | POA: Diagnosis not present

## 2016-11-12 DIAGNOSIS — R6889 Other general symptoms and signs: Secondary | ICD-10-CM | POA: Diagnosis not present

## 2016-11-12 DIAGNOSIS — E782 Mixed hyperlipidemia: Secondary | ICD-10-CM | POA: Diagnosis not present

## 2016-11-12 DIAGNOSIS — M150 Primary generalized (osteo)arthritis: Secondary | ICD-10-CM | POA: Diagnosis not present

## 2016-11-13 ENCOUNTER — Ambulatory Visit (HOSPITAL_BASED_OUTPATIENT_CLINIC_OR_DEPARTMENT_OTHER): Payer: 59 | Admitting: Hematology

## 2016-11-13 ENCOUNTER — Telehealth: Payer: Self-pay

## 2016-11-13 VITALS — BP 131/96 | HR 59 | Temp 98.6°F | Resp 20 | Ht 69.0 in | Wt 202.9 lb

## 2016-11-13 DIAGNOSIS — D6481 Anemia due to antineoplastic chemotherapy: Secondary | ICD-10-CM

## 2016-11-13 DIAGNOSIS — C154 Malignant neoplasm of middle third of esophagus: Secondary | ICD-10-CM | POA: Diagnosis not present

## 2016-11-13 DIAGNOSIS — R1310 Dysphagia, unspecified: Secondary | ICD-10-CM

## 2016-11-13 NOTE — Telephone Encounter (Signed)
Gave patient avs and calender of upcoming appointment for 12/17per los

## 2016-11-13 NOTE — Progress Notes (Signed)
Juan [name]  [phone] [phone]  Clinic follow Up Note   Patient Care Team: [place], [place] Merrilee Seashore), MD (Inactive) as PCP - General (Internal Medicine) [name], MD as Consulting Physician (Hematology) Kyung Rudd, MD as Consulting Physician (Radiation Oncology) Tania Ade, RN as Registered Nurse Juanita Craver, MD as Consulting Physician (Gastroenterology) 11/13/2016  CHIEF COMPLAINTS:  Follow up esophageal squamous cell carcinoma  Oncology History   Presented with dysphagia and odynophagia with 20-25 pounds of weight loss in about 3-4 months  Esophageal cancer [facility])   Staging form: Esophagus - Adenocarcinoma, AJCC 7th Edition   - Clinical stage from 11/13/2015: Stage IIIB (T3, N2, M0, G2) - Signed by [name], MD on 12/11/2015      Esophageal cancer ([place])   11/13/2015 Procedure    UPPER ENDOSCOPY per Dr. Collene Mares: Large fungating, friable bleeding mass in middle third of esophagus, 22cm from incisors and extended to 27cm. Non-obstructing.      11/13/2015 Pathology Results    Invasive squamous cell carcinoma; moderately differentiated      11/13/2015 Imaging    CT ABD/PELVIS: IMPRESSION:No evidence of metastatic disease in the abdomen or pelvis. 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 Aortoiliac atherosclerosis. Small bilateral inguinal hernias containing fat the      11/22/2015 Initial Diagnosis    Esophageal cancer ([place])     12/03/2015 Imaging    PET scan showed a long segment of hypermetabolic thickening in the mid esophagus consistent with esophageal carcinoma. No clear evidence of node metastasis or distant metastasis.      12/10/2015 - 01/14/2016 Radiation Therapy    Neoadjuvant radiation to his esophageal cancer      12/10/2015 - 01/15/2016 Chemotherapy    Neoadjuvant weekly carboplatin AUC 2, and Taxol 45 mg/m, with concurrent radiation. He developed steroid-induced psychosis, and Taxol was changed to Abraxane to  avoid premedication with steroids.      02/24/2016 Imaging    CT CAP with contrast showed interval improvement mid esophageal mass, no evidence for metastatic adenopathy or distant metastasis.       03/10/2016 Procedure    Repeat EGD by Dr. Benson Norway showed wall thickening in the thoracic esophagus with the tumor was. The thickness decreased from 12.8 mm to 6-7 mm. Not able to differentiate between actual tumor versus fibrosis from radiation. Some peritumoral shoddy lymph nodes.      04/07/2016 Surgery    Patient followed up with Dr. Servando Snare, patient is not a great candidate for surgery, pt also does not want surgery, mutually agreed to not pursue esophagectomy.       06/01/2016 Imaging    CT C/A/P IMPRESSION: Mild mid esophageal wall thickening without residual mass on CT. Radiation changes in the paramediastinal lung bilaterally. No evidence of recurrent or metastatic disease.      08/18/2016 -  Hospital Admission    Patient presented to the ER with SOB and complaints of chest pain      11/07/2016 Imaging    Nm Pet Image Restag IMPRESSION: Negative PET-CT. No findings for recurrent esophageal cancer or metastatic disease.       HISTORY OF PRESENTING ILLNESS:  Juan [name] 76 y.o. male is here because of His recently diagnosed esophageal cancer. He is accompanied by his wife to my clinic today.  He started having sore throat and dysphagia about 4-5 months ago, and was seen by PCP, had 2 course antibiotics but is symptom progressed. He was referred to gastroenterologist Dr. Suezanne Cheshire, and underwent  EGD on 11/13/2015, which showed a large fungating, friable bleeding mass in the middle third of esophagus, 22 cm to 27 cm from incisors, non-obstructing. The biopsy showed squamous cell carcinoma. His CT abdomen and pelvis was negative for metastatic disease.  He has lost 20lbs, he is on soft diet and liquids, his pain is about 7/10 pain with swallowing. He also has chest pain , intermittent, it lasts  about a few minutes, no nausea or vomiting, his bowel movement is normal. He denies melena or hematochezia. He is able to function and tolerated light activities at home.  CURRENT THERAPY: Observation   INTERIM HISTORY: Of note, since the patient last visit, he had a PET scan completed on 11/07/16 with results revealing: IMPRESSION: Negative PET-CT. No findings for recurrent esophageal cancer or metastatic disease.   [name] returns for follow-up. He is doing well overall. He reports that he recently lost his sister several weeks ago, he has been grieving about this. He has been gaining weight, he has been able to tolerate well and without difficulty swallowing. He denies any pain with swallowing. He denies abdominal pain, nausea, vomiting, abdominal bloating. He has not recently had to take analgesics or antiemetics. He is able to going about his usual household activities without increased fatigue recently. He also notes some mild DOE; this resolves with rest  On review of systems, pt denies fever, chills, weight loss, decreased appetite, decreased energy levels. Denies pain. Pt denies abdominal pain, nausea, vomiting.   MEDICAL HISTORY:  Past Medical History:  Diagnosis Date  . Diabetes mellitus without complication ([place])   . Esophageal cancer ([place])   . LBBB (left bundle branch block) 02/11/2016  . Reflux     SURGICAL HISTORY: Past Surgical History:  Procedure Laterality Date  . EUS N/A 12/05/2015   Procedure: UPPER ENDOSCOPIC ULTRASOUND (EUS) LINEAR;  Surgeon: Carol Ada, MD;  Location: [facility] ENDOSCOPY;  Service: Endoscopy;  Laterality: N/A;  . EUS N/A 03/10/2016   Procedure: UPPER ENDOSCOPIC ULTRASOUND (EUS) LINEAR;  Surgeon: Carol Ada, MD;  Location: [facility] ENDOSCOPY;  Service: Endoscopy;  Laterality: N/A;  . HERNIA REPAIR    . IR GENERIC HISTORICAL  12/24/2015   IR FLUORO GUIDE PORT INSERTION RIGHT 12/24/2015 Arne Cleveland, MD [facility]INTER[facility] RAD  . IR GENERIC HISTORICAL  12/24/2015   IR  US GUIDE VASC ACCESS RIGHT 12/24/2015 Arne Cleveland, MD [facility]  . SHOULDER ARTHROSCOPY W/ SUPERIOR LABRAL ANTERIOR POSTERIOR LESION REPAIR      SOCIAL HISTORY: Social History   Social History  . Marital status: Married    Spouse name: N/A  . Number of children: N/A  . Years of education: N/A   Occupational History  . Not on file.   Social History Main Topics  . Smoking status: Former Smoker    Packs/day: 2.00    Years: 40.00    Quit date: 03/31/1999  . Smokeless tobacco: Never Used  . Alcohol use No     Comment: weekend binge drinking for 30-40 years, quit in 2001  . Drug use: No  . Sexual activity: Not on file   Other Topics Concern  . 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 Colon cancer Father 62  .  Colon cancer Brother 35  . Heart attack Brother   . Heart disease Mother   . Heart attack Mother   . Diabetes Sister   . Stroke Sister     ALLERGIES:  has No Known Allergies.  MEDICATIONS:  Current Outpatient Prescriptions  Medication Sig Dispense Refill  . alfuzosin (UROXATRAL) 10 MG 24 hr tablet Take 10 mg by mouth daily with breakfast.    . amitriptyline (ELAVIL) 10 MG tablet Take 10 mg by mouth at bedtime.     Marland Kitchen aspirin EC 81 MG tablet Take 81 mg by mouth daily.    Marland Kitchen atorvastatin (LIPITOR) 20 MG tablet Take 20 mg by mouth at bedtime.     . metFORMIN (GLUCOPHAGE) 500 MG tablet Take 500 mg by mouth 2 (two) times daily with a meal. States takes sometimes    . oxyCODONE (ROXICODONE) 15 MG immediate release tablet Take 1 tablet (15 mg total) by mouth every 6 (six) hours as needed for pain. 30 tablet 0  . pantoprazole (PROTONIX) 40 MG tablet Take 1 tablet (40 mg total) by mouth daily. 30 tablet 0  . predniSONE  (DELTASONE) 20 MG tablet Two daily with food (Patient taking differently: Take 20 mg by mouth daily with breakfast. Two daily with food) 10 tablet 0  . sodium chloride (OCEAN) 0.65 % SOLN nasal spray Place 1 spray into both nostrils as needed for congestion. 30 mL 2   No current facility-administered medications for this visit.     REVIEW OF SYSTEMS:   Constitutional: Denies fevers, chills or abnormal night sweats. Denies fatigue.  Eyes: Denies blurriness of vision, double vision or watery eyes Ears, nose, mouth, throat, and face: Denies mucositis or dysphagia  Respiratory: Denies cough, dyspnea or wheezes (+) DOE Cardiovascular: Denies palpitation, chest discomfort or lower extremity swelling Gastrointestinal:  Denies nausea or change in bowel habits  Skin: Denies abnormal skin rashes Musculoskeletal: (+) arthritis  Lymphatics: Denies new lymphadenopathy or easy bruising Neurological:Denies numbness, tingling or new weaknesses Behavioral/Psych: Mood is stable, no new changes  All other systems were reviewed with the patient and are negative.  PHYSICAL EXAMINATION:  ECOG PERFORMANCE STATUS: 1 - Symptomatic but completely ambulatory  Vitals:   11/13/16 1527  BP: (!) 131/96  Pulse: (!) 59  Resp: 20  Temp: 98.6 F (37 C)  SpO2: 100%   Filed Weights   11/13/16 1527  Weight: 202 lb 14.4 oz (92 kg)    GENERAL:alert, no distress and comfortable SKIN: skin color, texture, turgor are normal, no rashes or significant lesions EYES: normal, conjunctiva are pink and non-injected, sclera clear OROPHARYNX:no exudate, no erythema and lips, buccal mucosa, and tongue normal  NECK: supple, thyroid normal size, non-tender, without nodularity LYMPH:  no palpable lymphadenopathy in the cervical, axillary or inguinal LUNGS: clear to auscultation and percussion with normal breathing effort HEART: regular rate & rhythm and no murmurs and no lower extremity edema ABDOMEN:abdomen soft, non-tender  and normal bowel sounds Musculoskeletal:no cyanosis of digits and no clubbing  PSYCH: alert & oriented x 3 with fluent speech NEURO: no focal motor/sensory deficits  LABORATORY DATA:  I have reviewed the data as listed CBC Latest Ref Rng & Units 11/04/2016 09/04/2016 08/18/2016  WBC 4.0 - 10.3 10e3/uL 5.7 3.7(L) 4.3  Hemoglobin 13.0 - 17.1 g/dL 12.0(L) 10.9(L) 10.4(L)  Hematocrit 38.4 - 49.9 % 38.2(L) 34.6(L) 34.1(L)  Platelets 140 - 400 10e3/uL 253 236 280   CMP Latest Ref Rng & Units 11/04/2016 09/04/2016 08/18/2016  Glucose 70 -  140 mg/dl 133 104 111(H)  BUN 7.0 - 26.0 mg/dL 11.3 6.8(L) 6  Creatinine 0.7 - 1.3 mg/dL 1.0 0.9 0.96  Sodium 136 - 145 mEq/L 139 139 139  Potassium 3.5 - 5.1 mEq/L 3.8 4.0 5.3(H)  Chloride 101 - 111 mmol/L - - 106  CO2 22 - 29 mEq/L 24 25 26   Calcium 8.4 - 10.4 mg/dL 9.1 9.3 8.7(L)  Total Protein 6.4 - 8.3 g/dL 7.1 7.1 6.2(L)  Total Bilirubin 0.20 - 1.20 mg/dL 0.26 0.39 1.2  Alkaline Phos 40 - 150 U/L 84 87 74  AST 5 - 34 U/L 17 24 29   ALT 0 - 55 U/L 31 22 12(L)    PATHOLOGY REPORT  Final microscopic diagnosis Esophagus, 0.2 cm to 27 cm, biopsy -Invasive squamous cell carcinoma, moderately differentiated   RADIOGRAPHIC STUDIES: I have personally reviewed the radiological images as listed and agreed with the findings in the report. Nm Pet Image Restag (ps) Skull Base To Thigh  Result Date: 11/08/2016 CLINICAL DATA:  Subsequent treatment strategy for esophageal cancer air. EXAM: NUCLEAR MEDICINE PET SKULL BASE TO THIGH TECHNIQUE: Eleven point to mCi F-18 FDG was injected intravenously. Full-ring PET imaging was performed from the skull base to thigh after the radiotracer. CT data was obtained and used for attenuation correction and anatomic localization. FASTING BLOOD GLUCOSE:  Value: 151 mg/dl COMPARISON:  Chest CT 08/19/2016 and prior PET-CT 12/03/2015 FINDINGS: NECK: No hypermetabolic lymph nodes in the neck.Doppler Doppler cava CHEST: No hypermetabolic  mediastinal or hilar nodes. No suspicious pulmonary nodules on the CT scan. No findings to suggest recurrent esophageal cancer. No mass or hypermetabolism. Mild residual wall thickening is likely due to radiation change. Stable radiation changes involving the paramediastinal lungs. ABDOMEN/PELVIS: No abnormal hypermetabolic activity within the liver, pancreas, adrenal glands, or spleen. No hypermetabolic lymph nodes in the abdomen or pelvis. SKELETON: No focal hypermetabolic activity to suggest skeletal metastasis. IMPRESSION: Negative PET-CT. No findings for recurrent esophageal cancer or metastatic disease. Electronically Signed   By: Marijo Sanes M.D.   On: 11/08/2016 12:29   EGD 11/13/2015 Dr. Collene Mares  -A large, fungating, friable mass with bleeding was found in the mid third of the esophagus, 22 cm from the incisors and extended to 27 cm. The mass was nonobstructing and a circumferential, biopsy was taken. -Normal stomach -Normal proximal small bowel  Colonoscopy 11/13/2015 Dr. Collene Mares -Diverticulosis in the entire exam: -No specimens collected  EUS 12/05/2015 Malignant esophageal tumor was found in the upper third of the esophagus and in the middle third of the esophagus. - A mass was found in the cervical esophagus and in the thoracic esophagus. The diagnosis is squamous cell carcinoma. This was staged T3 N1/N2 Mx by endosonographic criteria. - No specimens collected.  EUS 03/10/2016 DR. HUNG  the esohpagus was thickened starting around 23 cm and it extended down in to the lower esophagus. Where the tumor was the thickest, prior to treatment, there was a marked reduction in volume. The thickness decreased from 12.8 mm down to 6-7.7 mm. Again, restaging post treatment is not the most reliable, however, it appears to be a T3. I cannot differentiate between actual tumor versus fibrosis/inflammation. Some peritumoral shoddy lymph nodes were noted. IMPRESSION:  - Benign-appearing esophageal  stenosis. - Normal stomach. - Normal examined duodenum. - Wall thickening was seen in the thoracic esophagus. - No specimens collected.    ASSESSMENT & PLAN: 76 y.o.  gentleman, without significant past medical history except acid reflux, presented with progressive  dysphagia and odynophagia and weight loss.  1. Esophageal cancer, squamous cell carcinoma, cT3N1-2M0, stage IIIB -I previously reviewed his EGD findings, biopsy results, and the CT scan findings with patient and his wife in great details -I previously dicussed staging PET scan findings with patient and his wife, the mid esophageal mass is hypermetabolic, no distant metastasis. -EUS revealed a T3 lesion, N1 vs N2, locally advanced disease. 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 Servando Snare has mutually agreed not to pursue esophagectomy due to his advanced age and general health condition -We previously discussed that his esophageal cancer is unlikely cured by chemoradiation. I reviewed his repeated EUS after his chemotherapy and irradiation, which showed good response to chemoradiation, but possible residual tumor. - PET scan was performed recently was normal without evidence of recurrence. I did discuss that this would not pick up small residual tumor. I again encouraged him to consider surgery as the likelihood of residual disease is high.  -I dicussed with him that I recommend him to undergo endoscopy and further discuss this with Dr Servando Snare and Dr Benson Norway. Pt is now interested in knowing if he has residual disease by repeating EGD, and may consider surgery for residual disease. If no residual disease, he will prefer to be monitored and not to do surgery.  -continue surveillance and f/u in 40mo w/ labs -dicussed warning signs and symptoms for earlier f/u   2. Dysphagia, resolved  -His dysphagia  has mostly resolved after chemotherapy and irradiation -follow up with dietician Pamala Hurry as needed -He has started gaining some weight back  3. Steroids induced psychosis -Resolved now. We'll avoid steroids in the future.  4. Leukopenia and anemia -Secondary to chemotherapy radiation -Leukopenia has resolved, he still has mild anemia, overall stable. -We'll continue monitoring, no need for blood transfusion  5. Chronic back pain -I previously encouraged the patient to follow with primary care physician for back pain -I will not refill his narcotics. -She has developed some hip pain, which she contributes to arthritis. I encourage him to call me if the pain gets worse.   6. Dyspnea on exertion  -Likely related to his mild anemia -encouraged him to lead a more active lifestyle and attempt to eat better  7. Left hip pain -Ongoing since last visit, 09/06/2016.  -PET scan negative (11/07/16), no evidence of bone mets  -He has been on a burst of Prednisone and is on pain medications for this. These medications were prescribed by his PCP.  -F/u w/ PCP as needed for this.   PLAN:  - Labs reviewed today; stable.  -PET scan reviewed, NED  - message Dr. Benson Norway and Dr. Servando Snare for repeating endoscopy - Labs and f/u in 10mo  All questions were answered. The patient knows to call the clinic with any problems, questions or concerns.  I spent 20 minutes counseling the patient face to face. The total time spent in the appointment was 25 minutes and more than 50% was on counseling.  This document serves as a record of services personally performed by [name], MD. It was created on her behalf by Reola Mosher, a trained medical scribe. The creation of this record is based on the scribe's personal observations and the provider's statements to them. This document has been checked and approved by the attending provider.    [name], MD 11/13/2016

## 2016-11-14 ENCOUNTER — Encounter: Payer: Self-pay | Admitting: Hematology

## 2016-11-24 ENCOUNTER — Other Ambulatory Visit: Payer: Self-pay | Admitting: Radiation Oncology

## 2016-11-24 DIAGNOSIS — C154 Malignant neoplasm of middle third of esophagus: Secondary | ICD-10-CM

## 2016-12-07 NOTE — Progress Notes (Addendum)
Juan [name] 76 y.o. man with S/P esophageal radiation for esophagus cancer to middle one third of the esophagus radiation completed 01-14-16, 6 month FU.  Weight changes, if any:  Wt Readings from Last 3 Encounters:  12/09/16 199 lb 12.8 oz (90.6 kg)  11/13/16 202 lb 14.4 oz (92 kg)  10/12/16 197 lb 9.6 oz (89.6 kg)   Appetite:Good eating three meals a day with snacks. Swallowing problems/swallowing pain:Denies having problems swallowing while eating or drinking liquids, eating soft foods, Nausea / Vomiting, if any: No Pain issues, if any: 5/10 to left leg taking Oxycodone  Prednisone 20 mg bid evidence of thrush. 11-13-16 Saw Dr Burr Medico Respiratory issues:SOB when he turns to his left side in bed, coughing up yellowish secretion, occasional wheezing. BP 130/82   Pulse (!) 58   Temp 97.9 F (36.6 C) (Oral)   Resp 18   Ht 5\' 9"  (1.753 m)   Wt 199 lb 12.8 oz (90.6 kg)   SpO2 98%   BMI 29.51 kg/m

## 2016-12-09 ENCOUNTER — Ambulatory Visit
Admission: RE | Admit: 2016-12-09 | Discharge: 2016-12-09 | Disposition: A | Discharge status: Home / Self Care | Payer: Medicare HMO | Source: Ambulatory Visit | Attending: Radiation Oncology | Admitting: Radiation Oncology

## 2016-12-09 ENCOUNTER — Encounter: Payer: Self-pay | Admitting: Radiation Oncology

## 2016-12-09 VITALS — BP 130/82 | HR 58 | Temp 97.9°F | Resp 18 | Ht 69.0 in | Wt 199.8 lb

## 2016-12-09 DIAGNOSIS — Z8501 Personal history of malignant neoplasm of esophagus: Secondary | ICD-10-CM | POA: Insufficient documentation

## 2016-12-09 DIAGNOSIS — Z08 Encounter for follow-up examination after completed treatment for malignant neoplasm: Secondary | ICD-10-CM | POA: Diagnosis not present

## 2016-12-09 DIAGNOSIS — E010 Iodine-deficiency related diffuse (endemic) goiter: Secondary | ICD-10-CM | POA: Diagnosis not present

## 2016-12-09 DIAGNOSIS — M79604 Pain in right leg: Secondary | ICD-10-CM | POA: Diagnosis not present

## 2016-12-09 DIAGNOSIS — R6889 Other general symptoms and signs: Secondary | ICD-10-CM | POA: Diagnosis not present

## 2016-12-09 DIAGNOSIS — C154 Malignant neoplasm of middle third of esophagus: Secondary | ICD-10-CM

## 2016-12-09 NOTE — Addendum Note (Signed)
Encounter addended by: Malena Edman, RN on: 12/09/2016  1:45 PM<BR>    Actions taken: Charge Capture section accepted

## 2016-12-09 NOTE — Progress Notes (Addendum)
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 This was accomplished with concurrent chemotherapy using a IMRT technique with daily image guidance.  Narrative:  The patient returns today for routine follow-up. The patient did well in tolerating his radiotherapy and has completed chemotherapy in the fall of 2017. He                   Past Medical History:  Past Medical History:  Diagnosis Date  . Diabetes mellitus without complication ([place])   . Esophageal cancer ([place])   . LBBB (left bundle branch block) 02/11/2016  . Reflux     Past Surgical History: Past Surgical History:  Procedure Laterality Date  . EUS N/A 12/05/2015   Procedure: UPPER ENDOSCOPIC ULTRASOUND (EUS) LINEAR;  Surgeon: Carol Ada, MD;  Location: [facility];  Service: Endoscopy;  Laterality: N/A;  . EUS N/A 03/10/2016   Procedure: UPPER ENDOSCOPIC ULTRASOUND (EUS) LINEAR;  Surgeon: Carol Ada, MD;  Location: [facility];  Service: Endoscopy;  Laterality: N/A;  . HERNIA REPAIR    . IR GENERIC HISTORICAL  12/24/2015   IR FLUORO GUIDE PORT INSERTION RIGHT 12/24/2015 Arne Cleveland, MD [facility]  . IR GENERIC HISTORICAL  12/24/2015   IR US GUIDE VASC ACCESS RIGHT 12/24/2015 Arne Cleveland, MD [facility]  . SHOULDER ARTHROSCOPY W/ SUPERIOR LABRAL ANTERIOR POSTERIOR LESION REPAIR      Social History:  Social History   Social History  . Marital status: Married    Spouse name: N/A  . Number of children: N/A  . Years of education: N/A   Occupational  History  . Not on file.   Social History Main Topics  . Smoking status: Former Smoker    Packs/day: 2.00    Years: 40.00    Quit date: 03/31/1999  . Smokeless tobacco: Never Used  . Alcohol use No     Comment: weekend binge drinking for 30-40 years, quit in 2001  . Drug use: No  . Sexual activity: Not on file   Other Topics Concern  . 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 Colon cancer Father 58  . Colon cancer Brother 13  . Heart attack Brother   . Heart disease Mother   . Heart attack Mother   . Diabetes Sister   . Stroke Sister     ALLERGIES:  has No Known Allergies.  Meds: Current Outpatient Prescriptions  Medication Sig Dispense Refill  . alfuzosin (UROXATRAL) 10 MG 24 hr tablet Take 10 mg by mouth daily with breakfast.    . aspirin EC 81 MG tablet Take 81 mg by mouth daily.    Marland Kitchen atorvastatin (LIPITOR) 20 MG tablet Take 20 mg by mouth at bedtime.     . gabapentin (NEURONTIN) 300 MG capsule Take 300 mg by mouth 2 (two) times daily.    Marland Kitchen  metFORMIN (GLUCOPHAGE) 500 MG tablet Take 500 mg by mouth 2 (two) times daily with a meal. States takes sometimes    . oxyCODONE (ROXICODONE) 15 MG immediate release tablet Take 1 tablet (15 mg total) by mouth every 6 (six) hours as needed for pain. 30 tablet 0  . pantoprazole (PROTONIX) 40 MG tablet Take 1 tablet (40 mg total) by mouth daily. 30 tablet 0  . predniSONE (DELTASONE) 20 MG tablet Two daily with food (Patient taking differently: Take 20 mg by mouth daily with breakfast. Two daily with food) 10 tablet 0  . amitriptyline (ELAVIL) 10 MG tablet Take 10 mg by mouth at bedtime.     . sodium chloride (OCEAN) 0.65 % SOLN nasal spray Place 1 spray into both nostrils as needed for congestion. (Patient not taking: Reported on 12/09/2016) 30 mL 2   No current facility-administered  medications for this encounter.     Physical Findings:  height is 5\' 9"  (1.753 m) and weight is 199 lb 12.8 oz (90.6 kg). His oral temperature is 97.9 F (36.6 C). His blood pressure is 130/82 and his pulse is 58 (abnormal). His respiration is 18 and oxygen saturation is 98%.   Pain Assessment Pain Score: 5  (Left leg)/10  In general this is a well appearing African American male in no acute distress. He is alert and oriented x4 and appropriate throughout the examination. HEENT reveals that the patient is normocephalic, atraumatic. EOMs are intact. PERRLA. Skin is intact without any evidence of gross lesions. Cardiovascular exam reveals a regular rate and rhythm, no clicks rubs or murmurs are auscultated. Chest is clear to auscultation bilaterally. Lymphatic assessment is performed and does not reveal any adenopathy in the cervical, supraclavicular, axillary, or inguinal chains, however there is fullness in what appears to be the right lobe of the thyroid gland. No palpable nodules are noted.  Abdomen has active bowel sounds in all quadrants and is intact. The abdomen is soft, non tender, non distended. Lower extremities are negative for pretibial pitting edema, deep calf tenderness, cyanosis or clubbing.   Lab Findings: Lab Results  Component Value Date   WBC 5.7 11/04/2016   HGB 12.0 (L) 11/04/2016   HCT 38.2 (L) 11/04/2016   MCV 71.8 (L) 11/04/2016   PLT 253 11/04/2016     Radiographic Findings: No results found.  Impression/Plan: 1. 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 The patient will undergo repeat EGD with Dr. Benson Norway and may be a candidate to consider esophagectomy. He will see Dr. Servando Snare back to review if he is a candidate. I will plan to see him back in one year or sooner as needed.  2. Right thyromegaly/goiter. The patient's most recent PET did not find any focal abnormalities, however I will reach out to Dr. Burr Medico and his PCP to determine if this  needs to be evaluated further by ultrasound. He is in agreement with this plan.  3. Right lower extremity pain secondary to arthritis. The patient will continue under the care of his PCP for this.     Carola Rhine, PAC

## 2016-12-10 ENCOUNTER — Ambulatory Visit: Payer: Managed Care, Other (non HMO) | Admitting: Radiation Oncology

## 2016-12-24 DIAGNOSIS — Z23 Encounter for immunization: Secondary | ICD-10-CM | POA: Diagnosis not present

## 2016-12-24 DIAGNOSIS — R229 Localized swelling, mass and lump, unspecified: Secondary | ICD-10-CM | POA: Diagnosis not present

## 2016-12-25 ENCOUNTER — Other Ambulatory Visit: Payer: Self-pay | Admitting: Radiation Oncology

## 2016-12-25 DIAGNOSIS — C154 Malignant neoplasm of middle third of esophagus: Secondary | ICD-10-CM

## 2017-01-05 ENCOUNTER — Telehealth: Payer: Self-pay

## 2017-01-05 NOTE — Telephone Encounter (Signed)
Pt was asking about the endoscopy. He has an appt with Dr Servando Snare on 10/25 and the endoscopy is not scheduled with Dr Benson Norway yet. Therefore Dr Servando Snare won't have the results to review with pt. Pt states his throat feels pretty good and he is eating.

## 2017-01-05 NOTE — Telephone Encounter (Signed)
Called pt and asked him to call Dr Benson Norway.

## 2017-01-05 NOTE — Telephone Encounter (Signed)
Please let pt call Dr. Ulyses Amor office to schedule his EGD before 10/25. I previously spoke with Dr. Christene Slates MD

## 2017-01-07 ENCOUNTER — Encounter (HOSPITAL_COMMUNITY): Payer: Self-pay | Admitting: Anesthesiology

## 2017-01-07 ENCOUNTER — Encounter (HOSPITAL_COMMUNITY): Payer: Self-pay

## 2017-01-07 ENCOUNTER — Ambulatory Visit (HOSPITAL_COMMUNITY)
Admission: RE | Admit: 2017-01-07 | Discharge: 2017-01-07 | Disposition: A | Discharge status: Home / Self Care | Payer: Medicare HMO | Source: Ambulatory Visit | Attending: Gastroenterology | Admitting: Gastroenterology

## 2017-01-07 ENCOUNTER — Encounter (HOSPITAL_COMMUNITY)
Admission: RE | Disposition: A | Discharge status: Home / Self Care | Payer: Self-pay | Source: Ambulatory Visit | Attending: Gastroenterology

## 2017-01-07 DIAGNOSIS — Z923 Personal history of irradiation: Secondary | ICD-10-CM | POA: Diagnosis not present

## 2017-01-07 DIAGNOSIS — E119 Type 2 diabetes mellitus without complications: Secondary | ICD-10-CM | POA: Diagnosis not present

## 2017-01-07 DIAGNOSIS — Z9221 Personal history of antineoplastic chemotherapy: Secondary | ICD-10-CM | POA: Diagnosis not present

## 2017-01-07 DIAGNOSIS — I447 Left bundle-branch block, unspecified: Secondary | ICD-10-CM | POA: Diagnosis not present

## 2017-01-07 DIAGNOSIS — Z87891 Personal history of nicotine dependence: Secondary | ICD-10-CM | POA: Diagnosis not present

## 2017-01-07 DIAGNOSIS — C159 Malignant neoplasm of esophagus, unspecified: Secondary | ICD-10-CM | POA: Diagnosis not present

## 2017-01-07 HISTORY — PX: UPPER GI ENDOSCOPY: SHX6162

## 2017-01-07 HISTORY — PX: EUS: SHX5427

## 2017-01-07 SURGERY — UPPER ENDOSCOPIC ULTRASOUND (EUS) LINEAR
Anesthesia: Monitor Anesthesia Care

## 2017-01-07 SURGERY — UPPER ENDOSCOPIC ULTRASOUND (EUS) LINEAR
Anesthesia: Moderate Sedation | Laterality: Left

## 2017-01-07 MED ORDER — FENTANYL CITRATE (PF) 100 MCG/2ML IJ SOLN
INTRAMUSCULAR | Status: AC
  Filled 2017-01-07: qty 2

## 2017-01-07 MED ORDER — MIDAZOLAM HCL 10 MG/2ML IJ SOLN
INTRAMUSCULAR | Status: DC | PRN
  Administered 2017-01-07 (×3): 2 mg via INTRAVENOUS
  Administered 2017-01-07 (×2): 1 mg via INTRAVENOUS

## 2017-01-07 MED ORDER — HEPARIN SOD (PORK) LOCK FLUSH 100 UNIT/ML IV SOLN
500.0000 [IU] | INTRAVENOUS | Status: AC | PRN
  Administered 2017-01-07: 500 [IU]

## 2017-01-07 MED ORDER — SODIUM CHLORIDE 0.9 % IV SOLN
INTRAVENOUS | Status: DC
  Administered 2017-01-07: 500 mL via INTRAVENOUS

## 2017-01-07 MED ORDER — MIDAZOLAM HCL 5 MG/ML IJ SOLN
INTRAMUSCULAR | Status: AC
  Filled 2017-01-07: qty 2

## 2017-01-07 MED ORDER — FENTANYL CITRATE (PF) 100 MCG/2ML IJ SOLN
INTRAMUSCULAR | Status: DC | PRN
  Administered 2017-01-07 (×3): 25 ug via INTRAVENOUS

## 2017-01-07 NOTE — Discharge Instructions (Signed)

## 2017-01-07 NOTE — Op Note (Signed)
[facility] Patient Name: Juan [name] Procedure Date : 01/07/2017 MRN: [id number] Attending MD: Carol Ada , MD Date of Birth: [birth year] CSN: [id number] Age: 76 Admit Type: Outpatient Procedure:                Upper EUS Indications:              Esophageal squamous cell                            carcinoma/Post-chemotherapy, Esophageal squamous                            cell carcinoma/Post-radiation Providers:                Carol Ada, MD, Cleda Daub, RN, Elspeth Cho                            Tech., Technician Referring MD:              Medicines:                Midazolam 7 mg IV, Fentanyl 75 micrograms IV Complications:            No immediate complications. Estimated Blood Loss:     Estimated blood loss: none. Procedure:                Pre-Anesthesia Assessment:                           - Prior to the procedure, a History and Physical                            was performed, and patient medications and                            allergies were reviewed. The patient's tolerance of                            previous anesthesia was also reviewed. The risks                            and benefits of the procedure and the sedation                            options and risks were discussed with the patient.                            All questions were answered, and informed consent                            was obtained. Prior Anticoagulants: The patient has                            taken no previous anticoagulant or antiplatelet                            agents. ASA Grade Assessment: III -  A patient with                            severe systemic disease. After reviewing the risks                            and benefits, the patient was deemed in                            satisfactory condition to undergo the procedure.                           - Sedation was administered by an endoscopy nurse.                            The sedation level attained  was moderate.                           After obtaining informed consent, the endoscope was                            passed under direct vision. Throughout the                            procedure, the patient's blood pressure, pulse, and                            oxygen saturations were monitored continuously. The                            [id number] ([id number]) scope was introduced through                            the mouth, and advanced to the duodenal bulb. The                            upper EUS was accomplished without difficulty. The                            patient tolerated the procedure well. Scope In: Scope Out: Findings:      Endoscopic Finding :      The examined esophagus was endoscopically normal.      The entire examined stomach was endoscopically normal.      The examined duodenum was endoscopically normal.      Endosonographic Finding :      Diffuse wall thickening was visualized endosonographically in the upper       third of the esophagus and in the middle third of the esophagus. The       esophageal wall measured up to 5 mm in total thickness.      One benign-appearing lymph node was visualized approximately 25 cm from       the incisors. This was four mm from the primary tumor. It measured 5 mm       by 5 mm in maximal cross-sectional diameter. The node was round,  heterogenous and had poorly defined margins.      This EUS continues to show improvement. The thickness of the esophageal       wall has decreased by 1-2 mm. A small benign appearing peritumoral lymph       node was identified. Again, restaging with EUS post chemo/XRT is       difficult to discern if there is any residual malignancy. Grossly the       endoscopic examination was normal. No stenosis was encountered during       this examination compared to the 02/2016 examination. Impression:               - Normal esophagus.                           - Normal stomach.                            - Normal examined duodenum.                           - Wall thickening was seen in the upper third of                            the esophagus and in the middle third of the                            esophagus.                           - One benign lymph node was visualized in the                            middle paraesophageal mediastinum (level 65M).                           - No specimens collected. Recommendation:           - Patient has a contact number available for                            emergencies. The signs and symptoms of potential                            delayed complications were discussed with the                            patient. Return to normal activities tomorrow.                            Written discharge instructions were provided to the                            patient.                           - Resume previous diet.                           -  Further management per CTS and Oncology. Procedure Code(s):        --- Professional ---                           [phone], Esophagogastroduodenoscopy, flexible,                            transoral; with endoscopic ultrasound examination                            limited to the esophagus, stomach or duodenum, and                            adjacent structures Diagnosis Code(s):        --- Professional ---                           K22.8, Other specified diseases of esophagus                           I89.9, Noninfective disorder of lymphatic vessels                            and lymph nodes, unspecified                           C15.9, Malignant neoplasm of esophagus, unspecified                           Z08, Encounter for follow-up examination after                            completed treatment for malignant neoplasm CPT copyright 2016 American Medical Association. All rights reserved. The codes documented in this report are preliminary and upon coder review may  be revised to meet current compliance  requirements. Carol Ada, MD Carol Ada, MD 01/07/2017 4:05:41 PM This report has been signed electronically. Number of Addenda: 0

## 2017-01-07 NOTE — H&P (Signed)
Ohn Bostic HPI: 76 year old male with SCC of the esophagus.  He is s/p chemo/XRT and here today for restaging of his cancer.  Past Medical History:  Diagnosis Date  . Diabetes mellitus without complication ([place])   . Esophageal cancer ([place])   . LBBB (left bundle branch block) 02/11/2016  . Reflux     Past Surgical History:  Procedure Laterality Date  . EUS N/A 12/05/2015   Procedure: UPPER ENDOSCOPIC ULTRASOUND (EUS) LINEAR;  Surgeon: Carol Ada, MD;  Location: [facility];  Service: Endoscopy;  Laterality: N/A;  . EUS N/A 03/10/2016   Procedure: UPPER ENDOSCOPIC ULTRASOUND (EUS) LINEAR;  Surgeon: Carol Ada, MD;  Location[facility];  Service: Endoscopy;  Laterality: N/A;  . HERNIA REPAIR    . IR GENERIC HISTORICAL  12/24/2015   IR FLUORO GUIDE PORT INSERTION RIGHT 12/24/2015 Arne Cleveland, MD [facility]  . IR GENERIC HISTORICAL  12/24/2015   IR US GUIDE VASC ACCESS RIGHT 12/24/2015 Arne Cleveland, MD [facility]  . SHOULDER ARTHROSCOPY W/ SUPERIOR LABRAL ANTERIOR POSTERIOR LESION REPAIR      Family History  Problem Relation Age of Onset  . Colon cancer Father 14  . Colon cancer Brother 35  . Heart attack Brother   . Heart disease Mother   . Heart attack Mother   . Diabetes Sister   . Stroke Sister     Social History:  reports that he quit smoking about 17 years ago. He has a 80.00 pack-year smoking history. He has never used smokeless tobacco. He reports that he does not drink alcohol or use drugs.  Allergies: No Known Allergies  Medications:  Scheduled:  Continuous: . sodium chloride 500 mL (01/07/17 1518)    No results found for this or any previous visit (from the past 24 hour(s)).   No results found.  ROS:  As stated above in the HPI otherwise negative.  Blood pressure (!) 142/88, pulse (!) 57, temperature 98.8 F (37.1 C), temperature source Oral, resp. rate 15, height 5\' 9"  (1.753 m), weight 89.8 kg (198 lb), SpO2 94 %.    PE: Gen: NAD, Alert  and Oriented HEENT:  [place]/AT, EOMI Neck: Supple, no LAD Lungs: CTA Bilaterally CV: RRR without M/G/R ABM: Soft, NTND, +BS Ext: No C/C/E  Assessment/Plan: 1) SCC of the esophagus - EUS/EGD.  Neshia Mckenzie D 01/07/2017, 3:32 PM

## 2017-01-07 NOTE — Anesthesia Preprocedure Evaluation (Deleted)
Anesthesia Evaluation  Patient identified by MRN, date of birth, ID band Patient awake    Reviewed: Allergy & Precautions, NPO status , Patient's Chart, lab work & pertinent test results  Airway Mallampati: II  TM Distance: >3 FB Neck ROM: Full    Dental  (+) Dental Advisory Given   Pulmonary neg pulmonary ROS, former smoker,    Pulmonary exam normal breath sounds clear to auscultation       Cardiovascular Normal cardiovascular exam Rhythm:Regular Rate:Normal  LBBB   Neuro/Psych negative neurological ROS  negative psych ROS   GI/Hepatic Neg liver ROS, GERD  ,Esoph cancer   Endo/Other  diabetes, Type 2, Oral Hypoglycemic Agents  Renal/GU negative Renal ROS  negative genitourinary   Musculoskeletal negative musculoskeletal ROS (+)   Abdominal   Peds  Hematology negative hematology ROS (+)   Anesthesia Other Findings   Reproductive/Obstetrics                             Anesthesia Physical  Anesthesia Plan  ASA: III  Anesthesia Plan: MAC   Post-op Pain Management:    Induction:   PONV Risk Score and Plan: Propofol infusion  Airway Management Planned: Nasal Cannula  Additional Equipment: None  Intra-op Plan:   Post-operative Plan:   Informed Consent: I have reviewed the patients History and Physical, chart, labs and discussed the procedure including the risks, benefits and alternatives for the proposed anesthesia with the patient or authorized representative who has indicated his/her understanding and acceptance.   Dental advisory given  Plan Discussed with: CRNA  Anesthesia Plan Comments:         Anesthesia Quick Evaluation

## 2017-01-08 ENCOUNTER — Encounter (HOSPITAL_COMMUNITY): Payer: Self-pay | Admitting: Gastroenterology

## 2017-01-14 ENCOUNTER — Encounter: Payer: Self-pay | Admitting: Cardiothoracic Surgery

## 2017-01-21 ENCOUNTER — Encounter: Payer: Self-pay | Admitting: Cardiothoracic Surgery

## 2017-01-21 ENCOUNTER — Ambulatory Visit (INDEPENDENT_AMBULATORY_CARE_PROVIDER_SITE_OTHER): Payer: 59 | Admitting: Cardiothoracic Surgery

## 2017-01-21 VITALS — BP 139/87 | HR 80 | Ht 69.0 in | Wt 203.0 lb

## 2017-01-21 DIAGNOSIS — C159 Malignant neoplasm of esophagus, unspecified: Secondary | ICD-10-CM | POA: Diagnosis not present

## 2017-01-21 DIAGNOSIS — Z9221 Personal history of antineoplastic chemotherapy: Secondary | ICD-10-CM | POA: Diagnosis not present

## 2017-01-21 DIAGNOSIS — Z923 Personal history of irradiation: Secondary | ICD-10-CM | POA: Diagnosis not present

## 2017-01-21 NOTE — Progress Notes (Signed)
[address]       [place] [address]             [phone]                    Caesar Zentz Truxton Medical Record #[id number] Date of Birth: 11-10-1940  Referring: [name], MD Primary Care: [name], [name]), MD (Inactive)  Chief Complaint:    History of esophageal cancer  Cancer Staging Esophageal cancer [facility]) Staging form: Esophagus - Adenocarcinoma, AJCC 7th Edition - Clinical stage from 11/13/2015: Stage IIIB (T3, N2, M0, G2) - Signed by [name], MD on 12/11/2015   History of Present Illness:    Patient returns to the office for follow-up check after recent repeat upper GI endoscopy with EUS.  The patient was treated for medicine esophageal squamous cell cancer with treatment with chemoradiation completed in October 2017.  After several visits and discussion about surgery the patient elected not to proceed with surgical resection.  He notes he continues to do well gaining weight up to 202 pounds, and taking a regular diet without difficulty.    He was first diagnosed with esophageal cancer-squamous cell -[place] [id number]. The patient noted a "sore throat and painful swallowing for more than a year, associated with weight loss from 196-171. While living in [place] he had been told that he had Barrett's esophagus and had had episodic endoscopies, but I have no reports at this.  Dr. Collene Mares saw for a follow-up colonoscopy, at that time a upper GI endoscopy was performed. A second endoscopy was performed with esophageal ultrasound as noted below :    A mass was found in the cervical esophagus and in the thoracic esophagus. The diagnosis is squamous cell carcinoma. This was staged T3 N1/N2 Mx by endosonographic criteria. From the descriptions of endoscopy is not clear to me if this patient has extensive squamous cell carcinoma involving the cervical and mid thoracic esophagus or 2 separate lesions.          THERAPY: concurrent  chemotherapy and radiation, with weekly carboplatin AUC 2 and Taxol 45 mg/m, started on 12/10/2015 and ended January 13, 2017  He's completed radiation   therapy on October 18  and chemotherapy  on January 14 2016  Current Activity/ Functional Status:  Patient is independent with mobility/ambulation, transfers, ADL's, IADL's.   Zubrod Score: At the time of surgery this patient's most appropriate activity status/level should be described as: []     0    Normal activity, no symptoms [x]     1    Restricted in physical strenuous activity but ambulatory, able to do out light work []     2    Ambulatory and capable of self care, unable to do work activities, up and about               >50 % of waking hours                              []     3    Only limited self care, in bed greater than 50% of waking hours []     4    Completely disabled, no self care, confined to bed or chair []     5    Moribund   Past Medical History:  Diagnosis Date  . Diabetes mellitus without complication ([place])   . Esophageal cancer ([place])   .  LBBB (left bundle branch block) 02/11/2016  . Reflux     Past Surgical History:  Procedure Laterality Date  . EUS N/A 12/05/2015   Procedure: UPPER ENDOSCOPIC ULTRASOUND (EUS) LINEAR;  Surgeon: Carol Ada, MD;  Location: [facility];  Service: Endoscopy;  Laterality: N/A;  . EUS N/A 03/10/2016   Procedure: UPPER ENDOSCOPIC ULTRASOUND (EUS) LINEAR;  Surgeon: Carol Ada, MD;  Location: [facility];  Service: Endoscopy;  Laterality: N/A;  . EUS N/A 01/07/2017   Procedure: UPPER ENDOSCOPIC ULTRASOUND (EUS) LINEAR;  Surgeon: Carol Ada, MD;  Location: [place];  Service: Endoscopy;  Laterality: N/A;  . HERNIA REPAIR    . IR GENERIC HISTORICAL  12/24/2015   IR FLUORO GUIDE PORT INSERTION RIGHT 12/24/2015 Arne Cleveland, MD [facility]  . IR GENERIC HISTORICAL  12/24/2015   IR US GUIDE VASC ACCESS RIGHT 12/24/2015 Arne Cleveland, MD [facility]  . SHOULDER ARTHROSCOPY W/  SUPERIOR LABRAL ANTERIOR POSTERIOR LESION REPAIR      Family History  Problem Relation Age of Onset  . Colon cancer Father 22  . Colon cancer Brother 63  . Heart attack Brother   . Heart disease Mother   . Heart attack Mother   . Diabetes Sister   . Stroke Sister     Social History   Social History  . Marital status: Married    Spouse name: N/A  . Number of children: N/A  . Years of education: N/A   Occupational History  . Not on file.   Social History Main Topics  . Smoking status: Former Smoker    Packs/day: 2.00    Years: 40.00    Quit date: 03/31/1999  . Smokeless tobacco: Never Used  . Alcohol use No     Comment: weekend binge drinking for 30-40 years, quit in 2001  . Drug use: No  . Sexual activity: Not on file   Other Topics Concern  . 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 Former Smoker  . Packs/day: 2.00  . Years: 40.00  . Quit date: 03/31/1999  Smokeless Tobacco  . Never Used    History  Alcohol Use No    Comment: weekend binge drinking for 30-40 years, quit in 2001     No Known Allergies  Current Outpatient Prescriptions  Medication Sig Dispense Refill  . alfuzosin (UROXATRAL) 10 MG 24 hr tablet Take 10 mg by mouth daily with breakfast.    . aspirin EC 81 MG tablet Take 81 mg by mouth daily.    Marland Kitchen atorvastatin (LIPITOR) 20 MG tablet Take 20 mg by mouth at bedtime.     . gabapentin (NEURONTIN) 300 MG capsule Take 300 mg by mouth 2 (two) times daily.    . metFORMIN (GLUCOPHAGE) 500 MG tablet Take 500 mg by mouth 2 (two) times daily with a meal. States takes sometimes    . oxyCODONE (ROXICODONE) 15 MG immediate release tablet Take 1 tablet (15 mg total) by mouth every 6 (six) hours as needed for pain. 30 tablet 0  . pantoprazole (PROTONIX) 40 MG tablet Take 1 tablet (40 mg total) by mouth daily. 30 tablet 0  . predniSONE  (DELTASONE) 20 MG tablet Two daily with food (Patient taking differently: Take 20 mg by mouth daily with breakfast. Two daily with food) 10 tablet 0  . amitriptyline (ELAVIL) 10 MG tablet Take  10 mg by mouth at bedtime.     . sodium chloride (OCEAN) 0.65 % SOLN nasal spray Place 1 spray into both nostrils as needed for congestion. (Patient not taking: Reported on 01/21/2017) 30 mL 2   No current facility-administered medications for this visit.       Review of Systems:     Review of Systems:     Cardiac Review of Systems: Y or N  Chest Pain [ n   ]  Resting SOB [  n ] Exertional SOB  [ n ]  Orthopnea [ n ]   Pedal Edema [n   ]    Palpitations n ] Syncope  [  n]   Presyncope [n   ]  General Review of Systems: [Y] = yes [  ]=no Constitional: recent weight change [gaining  ]; anorexia [  ]; fatigue [  ]; nausea [  ]; night sweats [  ]; fever [  ]; or chills [  ];                                                                                                                                          Dental: poor dentition[  ]; Last Dentist visit: unknown  Eye : blurred vision [n  ]; diplopia [   ]; vision changes [  ];  Amaurosis fugax[  ]; Resp: cough [  ];  wheezing[  ];  hemoptysis[  ]; shortness of breath[  ]; paroxysmal nocturnal dyspnea[  ]; dyspnea on exertion[  ]; or orthopnea[  ];  GI:  gallstones[ n ], vomiting[  ];  dysphagia[ n ]; melena[  ];  hematochezia [  ]; heartburn[  ];   Hx of  Colonoscopy[  ]; GU: kidney stones [  ]; hematuria[  ];   dysuria [  ];  nocturia[  ];  history of     obstruction [  ];                 Skin: rash, swelling[  ];, hair loss[  ];  peripheral edema[  ];  or itching[  ]; Musculosketetal: myalgias[  ];  joint swelling[  ];  joint erythema[  ];  joint pain[  ];  back pain[  ];  Heme/Lymph: bruising[  ];  bleeding[  ];  anemia[  ];  Neuro: TIA[  ];  headaches[  ];  stroke[n  ];  vertigo[  ];  seizures[  ];   paresthesias[  ];  difficulty walking[ y left  hip pain ];  Psych:depression[n  ]; anxiety[ n ];  Endocrine: diabetes[ n ];  thyroid dysfunction[ n ];  Immunizations: Flu [  ]; Pneumococcal[  ];  Other:  Physical Exam: BP 139/87   Pulse 80   Ht 5\' 9"  (1.753 m)   Wt 203 lb (92.1 kg)   SpO2 99%   BMI 29.98 kg/m  PHYSICAL EXAMINATION:   No cervical adenopathy  General appearance: alert and cooperative Head: Normocephalic, without obvious abnormality, atraumatic Neck: no adenopathy, no carotid bruit, no JVD, supple, symmetrical, trachea midline and thyroid not enlarged, symmetric, no tenderness/mass/nodules Lymph nodes: Cervical, supraclavicular, and axillary nodes normal. Resp: clear to auscultation bilaterally Back: symmetric, no curvature. ROM normal. No CVA tenderness. Cardio: regular rate and rhythm, S1, S2 normal, no murmur, click, rub or gallop GI: soft, non-tender; bowel sounds normal; no masses,  no organomegaly Extremities: extremities normal, atraumatic, no cyanosis or edema and Homans sign is negative, no sign of DVT Neurologic: Grossly normal  Diagnostic Studies & Laboratory data:     Recent Radiology Findings:  CLINICAL DATA:  Dyspnea. History of reflux, esophageal cancer, diabetes  EXAM: CT ANGIOGRAPHY CHEST WITH CONTRAST  TECHNIQUE: Multidetector CT imaging of the chest was performed using the standard protocol during bolus administration of intravenous contrast. Multiplanar CT image reconstructions and MIPs were obtained to evaluate the vascular anatomy.  CONTRAST:  79 mL Isovue 370  COMPARISON:  06/01/2016  FINDINGS: Cardiovascular: Satisfactory opacification of the pulmonary arteries to the segmental level. No evidence of pulmonary embolism. Normal heart size. No pericardial effusion.  Mediastinum/Nodes: No significant lymphadenopathy in the chest. Esophagus is decompressed. Difficult to evaluate for residual wall thickness due to decompression. Thyroid gland is unremarkable.  Right central venous catheter with tip in the superior vena cava.  Lungs/Pleura: Evaluation is limited due to motion artifact. Hazy opacities in both lungs, more prominent posteriorly, suggesting edema or pneumonia. This is new since previous study. No pleural effusions. No pneumothorax. Airways appear patent. Small bulla in the anterior left lung.  Upper Abdomen: Calcified granuloma in the spleen.  Musculoskeletal: Degenerative changes in the spine. No destructive bone lesions.  Review of the MIP images confirms the above findings.  IMPRESSION: No evidence of significant pulmonary embolus. Hazy opacities in both lungs suggesting edema or pneumonia, new since previous study.   Electronically Signed   By: Lucienne Capers M.D.   On: 08/19/2016 03:25     Ct Chest W Contrast & Ct Abdomen Pelvis W Contrast  Result Date: 02/24/2016 CLINICAL DATA:  Restaging esophageal carcinoma EXAM: CT CHEST, ABDOMEN, AND PELVIS WITH CONTRAST TECHNIQUE: Multidetector CT imaging of the chest, abdomen and pelvis was performed following the standard protocol during bolus administration of intravenous contrast. CONTRAST:  166mL ISOVUE-300 IOPAMIDOL (ISOVUE-300) INJECTION 61% COMPARISON:  PET-CT from 12/03/2015 FINDINGS: CT CHEST FINDINGS Cardiovascular: The heart size is normal. Aortic atherosclerosis identified. LAD and left circumflex coronary artery calcifications identified. Mediastinum/Nodes: No mediastinal or hilar adenopathy identified. Circumferential wall thickening involving the mid thoracic esophagus is again noted. This appears improved from previous exam. At the level of the aortic arch the esophagus, at its thickest, measures 1.8 x 1.8 cm, image 16 of series 2. Previously 2.6 x 3.3 cm. Lungs/Pleura: No pleural effusion identified. Mild changes of centrilobular emphysema no suspicious pulmonary nodules identified. Musculoskeletal: Degenerative disc disease is identified within the thoracic  spine. No chest wall mass or suspicious bone lesions identified. CT ABDOMEN PELVIS FINDINGS Hepatobiliary: No focal liver abnormality is seen. No gallstones, gallbladder wall thickening, or biliary dilatation. Pancreas: Unremarkable. No pancreatic ductal dilatation or surrounding inflammatory changes. Spleen: Calcified granulomas identified within the spleen. Adrenals/Urinary Tract: The adrenal glands are normal. Parapelvic cyst identified within the left kidney measuring 1.4 cm, image 58 of series 2. Urinary bladder appears normal. Stomach/Bowel: The stomach is normal. The small bowel loops have a normal course and caliber.  No obstruction. Unremarkable appearance of the colon. Vascular/Lymphatic: Aortic atherosclerosis. No enlarged upper abdominal lymph nodes. No pelvic or inguinal adenopathy. Reproductive: Prostate is unremarkable. Other: There is no ascites or focal fluid collections within the abdomen or pelvis. Musculoskeletal: No suspicious bone lesions identified. Spondylosis noted within the lumbar spine. IMPRESSION: 1. Interval improvement and mid esophageal mass. 2. No evidence for metastatic adenopathy or distant metastatic disease. 3. Aortic atherosclerosis and coronary artery calcification. Electronically Signed   By: Kerby Moors M.D.   On: 02/24/2016 16:33  Ct Head Wo Contrast  Result Date: 12/19/2015 CLINICAL DATA:  Patient with history of psychiatric issues. History of esophageal carcinoma. EXAM: CT HEAD WITHOUT CONTRAST TECHNIQUE: Contiguous axial images were obtained from the base of the skull through the vertex without intravenous contrast. COMPARISON:  Brain CT 01/05/2014 FINDINGS: Brain: Ventricles and sulci are appropriate for patient's age. No evidence for acute cortically based infarct, intracranial hemorrhage, mass lesion or mass-effect. Vascular: No hyperdense vessel or unexpected calcification. Skull: Normal. Negative for fracture or focal lesion. Sinuses/Orbits: No acute finding.  Other: None. IMPRESSION: No acute intracranial process. Electronically Signed   By: Lovey Newcomer M.D.   On: 12/19/2015 15:51   Nm Pet Image Initial (pi) Skull Base To Thigh  Result Date: 12/03/2015 CLINICAL DATA:  Initial treatment strategy for esophageal cancer of the middle third of the esophagus. EXAM: NUCLEAR MEDICINE PET SKULL BASE TO THIGH TECHNIQUE: 12.7 mCi F-18 FDG was injected intravenously. Full-ring PET imaging was performed from the skull base to thigh after the radiotracer. CT data was obtained and used for attenuation correction and anatomic localization. FASTING BLOOD GLUCOSE:  Value: 11 mg/dl COMPARISON:  CT abdomen 11/13/2015 FINDINGS: NECK No hypermetabolic lymph nodes in the neck. CHEST A 3 cm segment of circumferential esophageal wall thickening at the level of the carina with intense metabolic activity (SUV max equal 18.2). Single wall thickness measures 1.5 cm High RIGHT paratracheal lymph node measures 7 mm (image 964, series 4) with low metabolic activity the hypermetabolic activity (SUV max equaled 2.1). No hypermetabolic paraesophageal lymph nodes. No suspicious pulmonary nodules. ABDOMEN/PELVIS No hypermetabolic gastrohepatic ligament nodes. No abnormal metabolic activity in the liver. No hypermetabolic retroperitoneal or pelvic lymph nodes. Physiologic activity noted within the colon. SKELETON No focal hypermetabolic activity to suggest skeletal metastasis. IMPRESSION: 1. Long segment of hypermetabolic thickening in the mid esophagus consistent with esophageal carcinoma. 2. No clear evidence of mediastinal nodal metastasis. Single high RIGHT paratracheal lymph node with low metabolic activity 3. No evidence of liver metastasis or upper abdominal nodal metastasis Electronically Signed   By: Suzy Bouchard M.D.   On: 12/03/2015 10:53     I have independently reviewed the above radiology studies  and reviewed the findings with the patient.     Recent Lab Findings: Lab Results    Component Value Date   WBC 5.7 11/04/2016   HGB 12.0 (L) 11/04/2016   HCT 38.2 (L) 11/04/2016   PLT 253 11/04/2016   GLUCOSE 133 11/04/2016   ALT 31 11/04/2016   AST 17 11/04/2016   NA 139 11/04/2016   K 3.8 11/04/2016   CL 106 08/18/2016   CREATININE 1.0 11/04/2016   BUN 11.3 11/04/2016   CO2 24 11/04/2016   INR 1.05 04/14/2016   Findings: A large, ulcerating mass with no bleeding and stigmata of recent bleeding was found in the upper third of the esophagus and in the middle third of the esophagus. The mass was non-obstructing and partially circumferential (involving  one-half of the lumen circumference). Endosonographic Finding A mass was found in the cervical esophagus and in the thoracic esophagus. The procedure was difficult to perform as a result of patient tolerance issues combined with rapid HR and severe HTN. In the upper to mid esophagus a large protruding mass was identified. The surrounding mucosa was friable and ulcerated. There was some initial difficulty with passing the radial echoendoscope through this area. Because of the patient's combativeness and the protrusion of the mass I was not able to obtain an accurate measurement of the mass with endoscopic visualization. Evaluation with ultrasound was challenging, but a large eccentric hypoechoic mass with irregular borders was identified. In the proximal portion it appeared to be noncircumfirential, but in the mid portion and distally the lesion eccentrically circumfirential. At the largest portion of the mass, it measured 12.8 mm in depth. At this point, the mass protruded beyond the adventitia (T3). In the area it was not clear to me if the mass extended or if it was a collection of lymph nodes. A couple of peritumoral lymph nodes were identified just proximal to this area. Evaluation of the Celiac axis was negative for any evidence of lymph nodes. The left lobe of the liver was negative for any suspicious lesions  and the left adrenal was normal in appearance. - Malignant esophageal tumor was found in the upper third of the esophagus and in the middle third of the esophagus. - A mass was found in the cervical esophagus and in the thoracic esophagus. The diagnosis is squamous cell carcinoma. This was staged T3 N1/N2 Mx by endosonographic criteria. - No specimens collected  Repeat EUS: 03/10/2016 Findings: One moderate benign-appearing, intrinsic stenosis was found 25 cm from the incisors. This measured 1.1 cm (inner diameter) x 1 cm (in length) and was traversed. The entire examined stomach was endoscopically normal. The examined duodenum was endoscopically normal. Endosonographic Finding Localized wall thickening was visualized endosonographically in the thoracic esophagus. The thickness of the abnormal layers measured 6 mm. The esophageal wall measured up to 8 mm in total thickness. Gross visualization of the esophagus revealed a marked improvement. There was no gross evidence of any mass in the esophageal lumen. Stenosis was encountered at 25 cm, but with gentle pressure the echoendoscope was able to be maneuvered through the area. A mild dilation of the mucosa occurred. With the ultrasound view, the esohpagus was thickened starting around 23 cm and it extended down in to the lower esophagus. Where the tumor was the thickest, prior to treatment, there was a marked reduction in volume. The thickness decreased from 12.8 mm down to 6-7.7 mm. Again, restaging post treatment is not the most reliable, however, it appears to be a T3. I cannot differentiate between actual tumor versus fibrosis/inflammation. Some peritumoral shoddy lymph nodes were noted.   12/2016 endoscopy with  EUS  - Normal esophagus. - Normal stomach. - Normal examined duodenum. - Wall thickening was seen in the upper third of the esophagus and in the middle third of the esophagus. - One benign lymph node was visualized in the  middle paraesophageal mediastinum (level 75M). - No specimens collected.    Assessment / Plan:   Patient with advanced squamous cell carcinoma of the esophagus, he is undergone treatment with radiation and chemotherapy with improvement of symptoms and swallowing ability.  No evidence of recurrence on endoscopy with EUS last week . He was  evaluated for possible esophageal resection and ultimately decided not to proceed with resection. He  continues to be doing well without evidence of recurrence. Plan to see him back in 6 months.  Grace Isaac MD      [address] [place] [address] Office [phone]   Beeper [phone]  01/21/2017 11:13 AM

## 2017-02-12 DIAGNOSIS — M150 Primary generalized (osteo)arthritis: Secondary | ICD-10-CM | POA: Diagnosis not present

## 2017-02-12 DIAGNOSIS — Z0000 Encounter for general adult medical examination without abnormal findings: Secondary | ICD-10-CM | POA: Diagnosis not present

## 2017-02-12 DIAGNOSIS — R7301 Impaired fasting glucose: Secondary | ICD-10-CM | POA: Diagnosis not present

## 2017-02-12 DIAGNOSIS — E782 Mixed hyperlipidemia: Secondary | ICD-10-CM | POA: Diagnosis not present

## 2017-03-01 ENCOUNTER — Encounter: Payer: Self-pay | Admitting: *Deleted

## 2017-03-01 ENCOUNTER — Telehealth: Payer: Self-pay | Admitting: *Deleted

## 2017-03-01 NOTE — Telephone Encounter (Signed)
Received call from pt stating that he wishes to go back to work.  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  He says his job is depending on him.  Next appt is 03/15/17 & wants to go back to work before then.  Discussed with Dr Burr Medico & OK to go back to work.  A letter will be done.  MD appt not scheduled.  Message to scheduler to add to 03/15/17 appts.

## 2017-03-01 NOTE — Telephone Encounter (Signed)
Will leave letter at front desk for pt to p/u tomorrow.

## 2017-03-03 DIAGNOSIS — E1165 Type 2 diabetes mellitus with hyperglycemia: Secondary | ICD-10-CM | POA: Diagnosis not present

## 2017-03-03 DIAGNOSIS — G609 Hereditary and idiopathic neuropathy, unspecified: Secondary | ICD-10-CM | POA: Diagnosis not present

## 2017-03-03 DIAGNOSIS — C159 Malignant neoplasm of esophagus, unspecified: Secondary | ICD-10-CM | POA: Diagnosis not present

## 2017-03-03 DIAGNOSIS — K219 Gastro-esophageal reflux disease without esophagitis: Secondary | ICD-10-CM | POA: Diagnosis not present

## 2017-03-03 DIAGNOSIS — E782 Mixed hyperlipidemia: Secondary | ICD-10-CM | POA: Diagnosis not present

## 2017-03-03 DIAGNOSIS — M5136 Other intervertebral disc degeneration, lumbar region: Secondary | ICD-10-CM | POA: Diagnosis not present

## 2017-03-03 DIAGNOSIS — Z23 Encounter for immunization: Secondary | ICD-10-CM | POA: Diagnosis not present

## 2017-03-11 NOTE — Progress Notes (Signed)
[place]  [phone] [phone]) [phone]  Clinic follow Up Note   Patient Care Team: [place], [place] Merrilee Seashore), MD (Inactive) as PCP - General (Internal Medicine) [name], MD as Consulting Physician (Hematology) Kyung Rudd, MD as Consulting Physician (Radiation Oncology) Juanita Craver, MD as Consulting Physician (Gastroenterology)  Date of Service:  03/15/2017   CHIEF COMPLAINTS:  Follow up esophageal squamous cell carcinoma  Oncology History   Presented with dysphagia and odynophagia with 20-25 pounds of weight loss in about 3-4 months  Esophageal cancer ([place])   Staging form: Esophagus - Adenocarcinoma, AJCC 7th Edition   - Clinical stage from 11/13/2015: Stage IIIB (T3, N2, M0, G2) - Signed by [name], MD on 12/11/2015      Esophageal cancer ([place])   11/13/2015 Procedure    UPPER ENDOSCOPY per Dr. Collene Mares: Large fungating, friable bleeding mass in middle third of esophagus, 22cm from incisors and extended to 27cm. Non-obstructing.      11/13/2015 Pathology Results    Invasive squamous cell carcinoma; moderately differentiated      11/13/2015 Imaging    CT ABD/PELVIS: IMPRESSION:No evidence of metastatic disease in the abdomen or pelvis. 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 Aortoiliac atherosclerosis. Small bilateral inguinal hernias containing fat the      11/22/2015 Initial Diagnosis    Esophageal cancer ([place])      12/03/2015 Imaging    PET scan showed a long segment of hypermetabolic thickening in the mid esophagus consistent with esophageal carcinoma. No clear evidence of node metastasis or distant metastasis.      12/10/2015 - 01/14/2016 Radiation Therapy    Neoadjuvant radiation to his esophageal cancer      12/10/2015 - 01/15/2016 Chemotherapy    Neoadjuvant weekly carboplatin AUC 2, and Taxol 45 mg/m, with concurrent radiation. He developed steroid-induced psychosis, and Taxol was changed to Abraxane to avoid premedication  with steroids.      02/24/2016 Imaging    CT CAP with contrast showed interval improvement mid esophageal mass, no evidence for metastatic adenopathy or distant metastasis.       03/10/2016 Procedure    Repeat EGD by Dr. Benson Norway showed wall thickening in the thoracic esophagus with the tumor was. The thickness decreased from 12.8 mm to 6-7 mm. Not able to differentiate between actual tumor versus fibrosis from radiation. Some peritumoral shoddy lymph nodes.      04/07/2016 Surgery    Patient followed up with Dr. Servando Snare, patient is not a great candidate for surgery, pt also does not want surgery, mutually agreed to not pursue esophagectomy.       06/01/2016 Imaging    CT C/A/P IMPRESSION: Mild mid esophageal wall thickening without residual mass on CT. Radiation changes in the paramediastinal lung bilaterally. No evidence of recurrent or metastatic disease.      08/18/2016 -  Hospital Admission    Patient presented to the ER with SOB and complaints of chest pain      11/07/2016 Imaging    Nm Pet Image Restag IMPRESSION: Negative PET-CT. No findings for recurrent esophageal cancer or metastatic disease.      01/07/2017 Procedure    EUS by Dr. Benson Norway 01/07/17 IMPRESSION - Normal esophagus. - Normal stomach. - Normal examined duodenum. - Wall thickening was seen in the upper third of the esophagus and in the middle third of the esophagus. - One benign lymph node was visualized in the middle paraesophageal mediastinum (level 60M). - No specimens collected.  HISTORY OF PRESENTING ILLNESS:  Juan [name] 76 y.o. male is here because of His recently diagnosed esophageal cancer. He is accompanied by his wife to my clinic today.  He started having sore throat and dysphagia about 4-5 months ago, and was seen by PCP, had 2 course antibiotics but is symptom progressed. He was referred to gastroenterologist Dr. Suezanne Cheshire, and underwent EGD on 11/13/2015, which showed a large fungating, friable  bleeding mass in the middle third of esophagus, 22 cm to 27 cm from incisors, non-obstructing. The biopsy showed squamous cell carcinoma. His CT abdomen and pelvis was negative for metastatic disease.  He has lost 20lbs, he is on soft diet and liquids, his pain is about 7/10 pain with swallowing. He also has chest pain , intermittent, it lasts about a few minutes, no nausea or vomiting, his bowel movement is normal. He denies melena or hematochezia. He is able to function and tolerated light activities at home.  CURRENT THERAPY: Observation   INTERIM HISTORY:  Juan [name] is here for a follow up. He presents to the clinic today noting he is doing well. He saw Dr. Benson Norway for EUS on 01/07/17 which was benign. He will see Dr. Servando Snare in 06/2017. He denies any bleeding recently.  He notes to take Gabapentin for his neuropathy in his left hip and leg. His pain is controlled. He is no longer taking prednisone. He will ask his PCP about his prednisone. He requested a letter for his job to say he was at this appointment.     MEDICAL HISTORY:  Past Medical History:  Diagnosis Date  . Diabetes mellitus without complication ([place])   . Esophageal cancer ([place])   . LBBB (left bundle branch block) 02/11/2016  . Reflux     SURGICAL HISTORY: Past Surgical History:  Procedure Laterality Date  . EUS N/A 12/05/2015   Procedure: UPPER ENDOSCOPIC ULTRASOUND (EUS) LINEAR;  Surgeon: Carol Ada, MD;  Location: [facility];  Service: Endoscopy;  Laterality: N/A;  . EUS N/A 03/10/2016   Procedure: UPPER ENDOSCOPIC ULTRASOUND (EUS) LINEAR;  Surgeon: Carol Ada, MD;  Location: [facility];  Service: Endoscopy;  Laterality: N/A;  . EUS N/A 01/07/2017   Procedure: UPPER ENDOSCOPIC ULTRASOUND (EUS) LINEAR;  Surgeon: Carol Ada, MD;  Location: [facility];  Service: Endoscopy;  Laterality: N/A;  . HERNIA REPAIR    . IR GENERIC HISTORICAL  12/24/2015   IR FLUORO GUIDE PORT INSERTION RIGHT 12/24/2015 Arne Cleveland,  MD [facility]  . IR GENERIC HISTORICAL  12/24/2015   IR US GUIDE VASC ACCESS RIGHT 12/24/2015 Arne Cleveland, MD [facility]  . SHOULDER ARTHROSCOPY W/ SUPERIOR LABRAL ANTERIOR POSTERIOR LESION REPAIR      SOCIAL HISTORY: Social History   Socioeconomic History  . Marital status: Married    Spouse name: Not on file  . Number of children: Not on file  . Years of education: Not on file  . Highest education level: Not on file  Social Needs  . Financial resource strain: Not on file  . Food insecurity - worry: Not on file  . Food insecurity - inability: Not on file  . Transportation needs - medical: Not on file  . Transportation needs - non-medical: Not on file  Occupational History  . Not on file  Tobacco Use  . Smoking status: Former Smoker    Packs/day: 2.00    Years: 40.00    Pack years: 80.00    Last attempt to quit: 03/31/1999    Years since quitting:  17.9  . Smokeless tobacco: Never Used  Substance and Sexual Activity  . Alcohol use: No    Comment: weekend binge drinking for 30-40 years, quit in 2001  . Drug use: No  . Sexual activity: Not on file  Other Topics Concern  . 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 Colon cancer Father 93  . Colon cancer Brother 73  . Heart attack Brother   . Heart disease Mother   . Heart attack Mother   . Diabetes Sister   . Stroke Sister     ALLERGIES:  has No Known Allergies.  MEDICATIONS:  Current Outpatient Medications  Medication Sig Dispense Refill  . alfuzosin (UROXATRAL) 10 MG 24 hr tablet Take 10 mg by mouth daily with breakfast.    . amitriptyline (ELAVIL) 10 MG tablet Take 10 mg by mouth at bedtime.     Marland Kitchen aspirin EC 81 MG tablet Take 81 mg by mouth daily.    Marland Kitchen atorvastatin  (LIPITOR) 20 MG tablet Take 20 mg by mouth at bedtime.     . gabapentin (NEURONTIN) 300 MG capsule Take 300 mg by mouth 2 (two) times daily.    . metFORMIN (GLUCOPHAGE) 500 MG tablet Take 500 mg by mouth 2 (two) times daily with a meal. States takes sometimes    . oxyCODONE (ROXICODONE) 15 MG immediate release tablet Take 1 tablet (15 mg total) by mouth every 6 (six) hours as needed for pain. 30 tablet 0  . pantoprazole (PROTONIX) 40 MG tablet Take 1 tablet (40 mg total) by mouth daily. 30 tablet 0  . predniSONE (DELTASONE) 20 MG tablet Two daily with food (Patient taking differently: Take 20 mg by mouth daily with breakfast. Two daily with food) 10 tablet 0  . sodium chloride (OCEAN) 0.65 % SOLN nasal spray Place 1 spray into both nostrils as needed for congestion. 30 mL 2   No current facility-administered medications for this visit.     REVIEW OF SYSTEMS:   Constitutional: Denies fevers, chills or abnormal night sweats. Denies fatigue.  Eyes: Denies blurriness of vision, double vision or watery eyes Ears, nose, mouth, throat, and face: Denies mucositis or dysphagia  Respiratory: Denies cough, dyspnea or wheezes (+) DOE Cardiovascular: Denies palpitation, chest discomfort or lower extremity swelling Gastrointestinal:  Denies nausea or change in bowel habits  Skin: Denies abnormal skin rashes Musculoskeletal: (+) arthritis, left hip pain  Lymphatics: Denies new lymphadenopathy or easy bruising Neurological:Denies numbness, tingling or new weaknesses Behavioral/Psych: Mood is stable, no new changes  All other systems were reviewed with the patient and are negative.  PHYSICAL EXAMINATION:  ECOG PERFORMANCE STATUS: 1 - Symptomatic but completely ambulatory  Vitals:   03/15/17 1037  BP: 132/79  Pulse: 64  Resp: 20  Temp: 99.6 F (37.6 C)  SpO2: 99%   Filed Weights   03/15/17 1037  Weight: 200 lb (90.7 kg)    GENERAL:alert, no distress and comfortable SKIN: skin color, texture,  turgor are normal, no rashes or significant lesions EYES: normal, conjunctiva are pink and non-injected, sclera clear OROPHARYNX:no exudate, no erythema and lips, buccal mucosa, and tongue normal  NECK: supple, thyroid normal size, non-tender, without nodularity LYMPH:  no palpable lymphadenopathy in  the cervical, axillary or inguinal LUNGS: clear to auscultation and percussion with normal breathing effort HEART: regular rate & rhythm and no murmurs and no lower extremity edema ABDOMEN:abdomen soft, non-tender and normal bowel sounds Musculoskeletal:no cyanosis of digits and no clubbing  PSYCH: alert & oriented x 3 with fluent speech NEURO: no focal motor/sensory deficits  LABORATORY DATA:  I have reviewed the data as listed CBC Latest Ref Rng & Units 03/15/2017 11/04/2016 09/04/2016  WBC 4.0 - 10.3 10e3/uL 2.8(L) 5.7 3.7(L)  Hemoglobin 13.0 - 17.1 g/dL 10.9(L) 12.0(L) 10.9(L)  Hematocrit 38.4 - 49.9 % 34.9(L) 38.2(L) 34.6(L)  Platelets 140 - 400 10e3/uL 242 253 236   CMP Latest Ref Rng & Units 03/15/2017 11/04/2016 09/04/2016  Glucose 70 - 140 mg/dl 111 133 104  BUN 7.0 - 26.0 mg/dL 6.8(L) 11.3 6.8(L)  Creatinine 0.7 - 1.3 mg/dL 0.8 1.0 0.9  Sodium 136 - 145 mEq/L 139 139 139  Potassium 3.5 - 5.1 mEq/L 3.7 3.8 4.0  Chloride 101 - 111 mmol/L - - -  CO2 22 - 29 mEq/L 24 24 25   Calcium 8.4 - 10.4 mg/dL 8.7 9.1 9.3  Total Protein 6.4 - 8.3 g/dL 6.9 7.1 7.1  Total Bilirubin 0.20 - 1.20 mg/dL 0.50 0.26 0.39  Alkaline Phos 40 - 150 U/L 68 84 87  AST 5 - 34 U/L 43(H) 17 24  ALT 0 - 55 U/L 25 31 22     PATHOLOGY REPORT  EGD 11/13/16 Final microscopic diagnosis Esophagus, 0.2 cm to 27 cm, biopsy -Invasive squamous cell carcinoma, moderately differentiated   PROCEDURES  EGD 11/13/2015 Dr. Collene Mares  -A large, fungating, friable mass with bleeding was found in the mid third of the esophagus, 22 cm from the incisors and extended to 27 cm. The mass was nonobstructing and a circumferential, biopsy was  taken. -Normal stomach -Normal proximal small bowel  Colonoscopy 11/13/2015 Dr. Collene Mares -Diverticulosis in the entire exam: -No specimens collected  EUS 12/05/2015 Malignant esophageal tumor was found in the upper third of the esophagus and in the middle third of the esophagus. - A mass was found in the cervical esophagus and in the thoracic esophagus. The diagnosis is squamous cell carcinoma. This was staged T3 N1/N2 Mx by endosonographic criteria. - No specimens collected.  EUS 03/10/2016 DR. HUNG  the esohpagus was thickened starting around 23 cm and it extended down in to the lower esophagus. Where the tumor was the thickest, prior to treatment, there was a marked reduction in volume. The thickness decreased from 12.8 mm down to 6-7.7 mm. Again, restaging post treatment is not the most reliable, however, it appears to be a T3. I cannot differentiate between actual tumor versus fibrosis/inflammation. Some peritumoral shoddy lymph nodes were noted. IMPRESSION:  - Benign-appearing esophageal stenosis. - Normal stomach. - Normal examined duodenum. - Wall thickening was seen in the thoracic esophagus. - No specimens collected.   EUS by Dr. Benson Norway 01/07/17 IMPRESSION - Normal esophagus. - Normal stomach. - Normal examined duodenum. - Wall thickening was seen in the upper third of the esophagus and in the middle third of the esophagus. - One benign lymph node was visualized in the middle paraesophageal mediastinum (level 68M). - No specimens collected.   RADIOGRAPHIC STUDIES: I have personally reviewed the radiological images as listed and agreed with the findings in the report. No results found.   PET 11/07/16  IMPRESSION: Negative PET-CT. No findings for recurrent esophageal cancer or metastatic disease.   ASSESSMENT & PLAN: 76 y.o.  gentleman, without significant past medical history except acid reflux, presented with progressive dysphagia and odynophagia and weight  loss.  1. Esophageal cancer, squamous cell carcinoma, cT3N1-2M0, stage IIIB -I previously reviewed his EGD findings, biopsy results, and the CT scan findings with patient and his wife in great details -I previously dicussed staging PET scan findings with patient and his wife, the mid esophageal mass is hypermetabolic, no distant metastasis. -EUS revealed a T3 lesion, N1 vs N2, locally advanced disease. 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 Servando Snare has mutually agreed not to pursue esophagectomy due to his advanced age and general health condition -We previously discussed that his esophageal cancer is unlikely cured by chemoradiation. I reviewed his repeated EUS after his chemotherapy and irradiation, which showed good response to chemoradiation, but possible residual tumor. -11/08/16 PET scan was was normal without evidence of recurrence. I did discuss that this would not pick up small residual tumor. I again encouraged him to consider surgery as the likelihood of residual disease is high.  -His 01/07/17 EUS was unremarkable without evidence of residual disease -Labs reviewed, anemia with Hg at 10.9. No signs of bleeding. WBC is 2.8, BUN at 6.8 and AST at 43.  -He is clinically doing well overall, will continue surveillance. F/u in 2 months with PET scan   2. Dysphagia, resolved  -His dysphagia has mostly resolved after chemotherapy and irradiation -follow up with dietician Pamala Hurry as needed -He has started gaining some weight back  3. History of steroids induced psychosis -Resolved now. We'll avoid steroids in the future.  4. Leukopenia and anemia -Secondary to chemotherapy radiation -Leukopenia has resolved, he still has mild anemia, overall stable. -Hg fluctuating but overall stable. WBC dropped to 2.8 on 03/15/17 -We'll continue monitoring, no  need for blood transfusion  5. Chronic back pain, recent left hip pain  -I previously encouraged the patient to follow with primary care physician for back pain -I will not refill his narcotics. -She has developed left hip pain since June 2018, which he contributes to arthritis. He follow up with his PCP. Managed with Gabapentin and oxycodone.   -PET scan in August 2018 was negative for metastasis   PLAN:  -I provided a letter to pt about taking off work for this appointment.  -F/u in 2 months  -Lab and PET one week before    All questions were answered. The patient knows to call the clinic with any problems, questions or concerns.  I spent 20 minutes counseling the patient face to face. The total time spent in the appointment was 25 minutes and more than 50% was on counseling.  This document serves as a record of services personally performed by [name], MD. It was created on her behalf by Joslyn Devon, a trained medical scribe. The creation of this record is based on the scribe's personal observations and the provider's statements to them.    I have reviewed the above documentation for accuracy and completeness, and I agree with the above.   [name], MD 03/15/2017

## 2017-03-15 ENCOUNTER — Telehealth: Payer: Self-pay | Admitting: Hematology

## 2017-03-15 ENCOUNTER — Ambulatory Visit (HOSPITAL_BASED_OUTPATIENT_CLINIC_OR_DEPARTMENT_OTHER): Payer: 59 | Admitting: Hematology

## 2017-03-15 ENCOUNTER — Ambulatory Visit (HOSPITAL_BASED_OUTPATIENT_CLINIC_OR_DEPARTMENT_OTHER): Payer: 59

## 2017-03-15 ENCOUNTER — Encounter: Payer: Self-pay | Admitting: Hematology

## 2017-03-15 ENCOUNTER — Other Ambulatory Visit (HOSPITAL_BASED_OUTPATIENT_CLINIC_OR_DEPARTMENT_OTHER): Payer: 59

## 2017-03-15 VITALS — BP 132/79 | HR 64 | Temp 99.6°F | Resp 20 | Ht 69.0 in | Wt 200.0 lb

## 2017-03-15 DIAGNOSIS — C154 Malignant neoplasm of middle third of esophagus: Secondary | ICD-10-CM

## 2017-03-15 DIAGNOSIS — D701 Agranulocytosis secondary to cancer chemotherapy: Secondary | ICD-10-CM

## 2017-03-15 DIAGNOSIS — D6481 Anemia due to antineoplastic chemotherapy: Secondary | ICD-10-CM

## 2017-03-15 DIAGNOSIS — G8929 Other chronic pain: Secondary | ICD-10-CM | POA: Diagnosis not present

## 2017-03-15 DIAGNOSIS — Z452 Encounter for adjustment and management of vascular access device: Secondary | ICD-10-CM | POA: Diagnosis not present

## 2017-03-15 DIAGNOSIS — M549 Dorsalgia, unspecified: Secondary | ICD-10-CM

## 2017-03-15 DIAGNOSIS — Z95828 Presence of other vascular implants and grafts: Secondary | ICD-10-CM

## 2017-03-15 LAB — CBC WITH DIFFERENTIAL/PLATELET
BASO%: 0.7 % (ref 0.0–2.0)
Basophils Absolute: 0 10*3/uL (ref 0.0–0.1)
EOS%: 3.5 % (ref 0.0–7.0)
Eosinophils Absolute: 0.1 10*3/uL (ref 0.0–0.5)
HCT: 34.9 % — ABNORMAL LOW (ref 38.4–49.9)
HGB: 10.9 g/dL — ABNORMAL LOW (ref 13.0–17.1)
LYMPH%: 28.4 % (ref 14.0–49.0)
MCH: 22.8 pg — ABNORMAL LOW (ref 27.2–33.4)
MCHC: 31.2 g/dL — ABNORMAL LOW (ref 32.0–36.0)
MCV: 73.1 fL — ABNORMAL LOW (ref 79.3–98.0)
MONO#: 0.3 10*3/uL (ref 0.1–0.9)
MONO%: 12.3 % (ref 0.0–14.0)
NEUT#: 1.6 10*3/uL (ref 1.5–6.5)
NEUT%: 55.1 % (ref 39.0–75.0)
Platelets: 242 10*3/uL (ref 140–400)
RBC: 4.77 10*6/uL (ref 4.20–5.82)
RDW: 15.6 % — ABNORMAL HIGH (ref 11.0–14.6)
WBC: 2.8 10*3/uL — ABNORMAL LOW (ref 4.0–10.3)
lymph#: 0.8 10*3/uL — ABNORMAL LOW (ref 0.9–3.3)

## 2017-03-15 LAB — COMPREHENSIVE METABOLIC PANEL
ALT: 25 U/L (ref 0–55)
AST: 43 U/L — ABNORMAL HIGH (ref 5–34)
Albumin: 3.8 g/dL (ref 3.5–5.0)
Alkaline Phosphatase: 68 U/L (ref 40–150)
Anion Gap: 8 mEq/L (ref 3–11)
BUN: 6.8 mg/dL — ABNORMAL LOW (ref 7.0–26.0)
CO2: 24 mEq/L (ref 22–29)
Calcium: 8.7 mg/dL (ref 8.4–10.4)
Chloride: 107 mEq/L (ref 98–109)
Creatinine: 0.8 mg/dL (ref 0.7–1.3)
EGFR: >60 mL/min/{1.73_m2} (ref >60)
Glucose: 111 mg/dl (ref 70–140)
Potassium: 3.7 mEq/L (ref 3.5–5.1)
Sodium: 139 mEq/L (ref 136–145)
Total Bilirubin: 0.5 mg/dL (ref 0.20–1.20)
Total Protein: 6.9 g/dL (ref 6.4–8.3)

## 2017-03-15 MED ORDER — SODIUM CHLORIDE 0.9 % IJ SOLN
10.0000 mL | INTRAMUSCULAR | Status: DC | PRN
Start: 2017-03-15 — End: 2017-03-15
  Administered 2017-03-15: 10 mL via INTRAVENOUS
  Filled 2017-03-15: qty 10

## 2017-03-15 MED ORDER — HEPARIN SOD (PORK) LOCK FLUSH 100 UNIT/ML IV SOLN
500.0000 [IU] | Freq: Once | INTRAVENOUS | Status: AC | PRN
  Administered 2017-03-15: 500 [IU] via INTRAVENOUS
  Filled 2017-03-15: qty 5

## 2017-03-15 NOTE — Telephone Encounter (Signed)
Gave avs and calendar for February 2019 °

## 2017-04-13 ENCOUNTER — Other Ambulatory Visit: Payer: Self-pay | Admitting: Hematology

## 2017-04-14 ENCOUNTER — Other Ambulatory Visit: Payer: Self-pay | Admitting: Hematology

## 2017-04-14 DIAGNOSIS — E1165 Type 2 diabetes mellitus with hyperglycemia: Secondary | ICD-10-CM | POA: Diagnosis not present

## 2017-04-14 DIAGNOSIS — E782 Mixed hyperlipidemia: Secondary | ICD-10-CM | POA: Diagnosis not present

## 2017-04-14 DIAGNOSIS — C154 Malignant neoplasm of middle third of esophagus: Secondary | ICD-10-CM

## 2017-04-14 DIAGNOSIS — G609 Hereditary and idiopathic neuropathy, unspecified: Secondary | ICD-10-CM | POA: Diagnosis not present

## 2017-04-15 ENCOUNTER — Other Ambulatory Visit: Payer: Self-pay | Admitting: *Deleted

## 2017-04-15 DIAGNOSIS — C154 Malignant neoplasm of middle third of esophagus: Secondary | ICD-10-CM

## 2017-04-27 ENCOUNTER — Telehealth: Payer: Self-pay | Admitting: *Deleted

## 2017-04-27 NOTE — Telephone Encounter (Signed)
"  The last two days I've had nose bleeds.  Blowing my nose, nasal congestion, I have a cold.  Used paper towels in my nose today, bled ten or fifteen minutes, changed towel twice, now just a few drops.  Using Nyquil at night for the cold.  No fever.  Do not use any blood thinners."  Reviewed what to do for nose bleeds.  Report to ED for heavy uncontrolled bleeding he is unable to stop within ten to fifteen minutes.  Contact PCP for congestion and cold symptoms.

## 2017-05-07 ENCOUNTER — Telehealth: Payer: Self-pay | Admitting: Hematology

## 2017-05-07 NOTE — Telephone Encounter (Signed)
Tried to call patient.

## 2017-05-10 ENCOUNTER — Inpatient Hospital Stay: Payer: 59 | Attending: Hematology

## 2017-05-10 ENCOUNTER — Ambulatory Visit (HOSPITAL_COMMUNITY)
Admission: RE | Admit: 2017-05-10 | Discharge: 2017-05-10 | Disposition: A | Discharge status: Home / Self Care | Payer: 59 | Source: Ambulatory Visit | Attending: Hematology | Admitting: Hematology

## 2017-05-10 ENCOUNTER — Encounter (HOSPITAL_COMMUNITY): Payer: Self-pay

## 2017-05-10 DIAGNOSIS — G8929 Other chronic pain: Secondary | ICD-10-CM | POA: Insufficient documentation

## 2017-05-10 DIAGNOSIS — I700 Atherosclerosis of aorta: Secondary | ICD-10-CM | POA: Insufficient documentation

## 2017-05-10 DIAGNOSIS — C154 Malignant neoplasm of middle third of esophagus: Secondary | ICD-10-CM | POA: Insufficient documentation

## 2017-05-10 DIAGNOSIS — C159 Malignant neoplasm of esophagus, unspecified: Secondary | ICD-10-CM | POA: Diagnosis not present

## 2017-05-10 DIAGNOSIS — D6481 Anemia due to antineoplastic chemotherapy: Secondary | ICD-10-CM | POA: Insufficient documentation

## 2017-05-10 DIAGNOSIS — M25552 Pain in left hip: Secondary | ICD-10-CM | POA: Insufficient documentation

## 2017-05-10 LAB — CBC WITH DIFFERENTIAL/PLATELET
Basophils Absolute: 0 10*3/uL (ref 0.0–0.1)
Basophils Relative: 1 %
Eosinophils Absolute: 0.1 10*3/uL (ref 0.0–0.5)
Eosinophils Relative: 3 %
HCT: 38.9 % (ref 38.4–49.9)
Hemoglobin: 11.8 g/dL — ABNORMAL LOW (ref 13.0–17.1)
Lymphocytes Relative: 36 %
Lymphs Abs: 1.3 10*3/uL (ref 0.9–3.3)
MCH: 23 pg — ABNORMAL LOW (ref 27.2–33.4)
MCHC: 30.3 g/dL — ABNORMAL LOW (ref 32.0–36.0)
MCV: 75.7 fL — ABNORMAL LOW (ref 79.3–98.0)
Monocytes Absolute: 0.3 10*3/uL (ref 0.1–0.9)
Monocytes Relative: 9 %
Neutro Abs: 1.8 10*3/uL (ref 1.5–6.5)
Neutrophils Relative %: 51 %
Platelets: 275 10*3/uL (ref 140–400)
RBC: 5.14 MIL/uL (ref 4.20–5.82)
RDW: 15.7 % — ABNORMAL HIGH (ref 11.0–14.6)
WBC: 3.5 10*3/uL — ABNORMAL LOW (ref 4.0–10.3)

## 2017-05-10 LAB — COMPREHENSIVE METABOLIC PANEL
ALT: 10 U/L (ref 0–55)
AST: 15 U/L (ref 5–34)
Albumin: 3.5 g/dL (ref 3.5–5.0)
Alkaline Phosphatase: 88 U/L (ref 40–150)
Anion gap: 8 (ref 3–11)
BUN: 8 mg/dL (ref 7–26)
CO2: 25 mmol/L (ref 22–29)
Calcium: 9 mg/dL (ref 8.4–10.4)
Chloride: 105 mmol/L (ref 98–109)
Creatinine, Ser: 0.91 mg/dL (ref 0.70–1.30)
GFR calc Af Amer: >60 mL/min (ref >60)
GFR calc non Af Amer: >60 mL/min (ref >60)
Glucose, Bld: 108 mg/dL (ref 70–140)
Potassium: 4.4 mmol/L (ref 3.5–5.1)
Sodium: 138 mmol/L (ref 136–145)
Total Bilirubin: 0.4 mg/dL (ref 0.2–1.2)
Total Protein: 7.2 g/dL (ref 6.4–8.3)

## 2017-05-10 MED ORDER — HEPARIN SOD (PORK) LOCK FLUSH 100 UNIT/ML IV SOLN
500.0000 [IU] | Freq: Once | INTRAVENOUS | Status: AC
  Administered 2017-05-10: 500 [IU] via INTRAVENOUS

## 2017-05-10 MED ORDER — SODIUM CHLORIDE 0.9 % IJ SOLN
INTRAMUSCULAR | Status: AC
  Filled 2017-05-10: qty 50

## 2017-05-10 MED ORDER — IOPAMIDOL (ISOVUE-300) INJECTION 61%
INTRAVENOUS | Status: AC
  Filled 2017-05-10: qty 100

## 2017-05-10 MED ORDER — HEPARIN SOD (PORK) LOCK FLUSH 100 UNIT/ML IV SOLN
INTRAVENOUS | Status: AC
  Filled 2017-05-10: qty 5

## 2017-05-10 MED ORDER — IOPAMIDOL (ISOVUE-300) INJECTION 61%
100.0000 mL | Freq: Once | INTRAVENOUS | Status: AC | PRN
  Administered 2017-05-10: 100 mL via INTRAVENOUS

## 2017-05-15 NOTE — Progress Notes (Signed)
[place]  Telephone:([phone]) [phone] Fax:(336) [phone]  Clinic follow Up Note   Patient Care Team: [place], [place] Merrilee Seashore), MD (Inactive) as PCP - General (Internal Medicine) [name], MD as Consulting Physician (Hematology) Kyung Rudd, MD as Consulting Physician (Radiation Oncology) Juanita Craver, MD as Consulting Physician (Gastroenterology)  Date of Service:  05/17/2017   CHIEF COMPLAINTS:  Follow up esophageal squamous cell carcinoma  Oncology History   Presented with dysphagia and odynophagia with 20-25 pounds of weight loss in about 3-4 months  Esophageal cancer ([place])   Staging form: Esophagus - Adenocarcinoma, AJCC 7th Edition   - Clinical stage from 11/13/2015: Stage IIIB (T3, N2, M0, G2) - Signed by [name], MD on 12/11/2015      Esophageal cancer ([place])   11/13/2015 Procedure    UPPER ENDOSCOPY per Dr. Collene Mares: Large fungating, friable bleeding mass in middle third of esophagus, 22cm from incisors and extended to 27cm. Non-obstructing.      11/13/2015 Pathology Results    Invasive squamous cell carcinoma; moderately differentiated      11/13/2015 Imaging    CT ABD/PELVIS: IMPRESSION:No evidence of metastatic disease in the abdomen or pelvis. 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 Aortoiliac atherosclerosis. Small bilateral inguinal hernias containing fat the      11/22/2015 Initial Diagnosis    Esophageal cancer ([place])      12/03/2015 Imaging    PET scan showed a long segment of hypermetabolic thickening in the mid esophagus consistent with esophageal carcinoma. No clear evidence of node metastasis or distant metastasis.      12/10/2015 - 01/14/2016 Radiation Therapy    Neoadjuvant radiation to his esophageal cancer      12/10/2015 - 01/15/2016 Chemotherapy    Neoadjuvant weekly carboplatin AUC 2, and Taxol 45 mg/m, with concurrent radiation. He developed steroid-induced psychosis, and Taxol was changed to Abraxane to avoid premedication  with steroids.      02/24/2016 Imaging    CT CAP with contrast showed interval improvement mid esophageal mass, no evidence for metastatic adenopathy or distant metastasis.       03/10/2016 Procedure    Repeat EGD by Dr. Benson Norway showed wall thickening in the thoracic esophagus with the tumor was. The thickness decreased from 12.8 mm to 6-7 mm. Not able to differentiate between actual tumor versus fibrosis from radiation. Some peritumoral shoddy lymph nodes.      04/07/2016 Surgery    Patient followed up with Dr. Servando Snare, patient is not a great candidate for surgery, pt also does not want surgery, mutually agreed to not pursue esophagectomy.       06/01/2016 Imaging    CT C/A/P IMPRESSION: Mild mid esophageal wall thickening without residual mass on CT. Radiation changes in the paramediastinal lung bilaterally. No evidence of recurrent or metastatic disease.      08/18/2016 -  Hospital Admission    Patient presented to the ER with SOB and complaints of chest pain      11/07/2016 Imaging    Nm Pet Image Restag IMPRESSION: Negative PET-CT. No findings for recurrent esophageal cancer or metastatic disease.      01/07/2017 Procedure    EUS by Dr. Benson Norway 01/07/17 IMPRESSION - Normal esophagus. - Normal stomach. - Normal examined duodenum. - Wall thickening was seen in the upper third of the esophagus and in the middle third of the esophagus. - One benign lymph node was visualized in the middle paraesophageal mediastinum (level 39M). - No specimens collected.  05/10/2017 Imaging    CT CAP W Contrast 05/10/17 IMPRESSION: 1. No new or progressive findings to suggest recurrent or metastatic disease in the chest or abdomen on today's study. 2.  Aortic Atherosclerois (ICD10-170.0)       HISTORY OF PRESENTING ILLNESS:  Juan [name] 77 y.o. male is here because of His recently diagnosed esophageal cancer. He is accompanied by his wife to my clinic today.  He started having sore throat  and dysphagia about 4-5 months ago, and was seen by PCP, had 2 course antibiotics but is symptom progressed. He was referred to gastroenterologist Dr. Suezanne Cheshire, and underwent EGD on 11/13/2015, which showed a large fungating, friable bleeding mass in the middle third of esophagus, 22 cm to 27 cm from incisors, non-obstructing. The biopsy showed squamous cell carcinoma. His CT abdomen and pelvis was negative for metastatic disease.  He has lost 20lbs, he is on soft diet and liquids, his pain is about 7/10 pain with swallowing. He also has chest pain , intermittent, it lasts about a few minutes, no nausea or vomiting, his bowel movement is normal. He denies melena or hematochezia. He is able to function and tolerated light activities at home.  CURRENT THERAPY: Observation   INTERIM HISTORY:  Juan [name] is here for a follow up. He presents to the clinic today by himself. He reports he is doing okay overall. He has continued left hip pain that he is tolerating. He takes oxycodone twice daily. He is not using a cane currently. He reports occasional tingling in his fingers but none in his toes.  He reports that he is working at [employer] and is not having any problems. He states he is separated now and lives by himself. His daughters do visit him from [place] a lot.   On review of systems, pt denies new pain, trouble eating/swallowing or any other complaints at this time. Pertinent positives are listed and detailed within the above HPI.   MEDICAL HISTORY:  Past Medical History:  Diagnosis Date  . Diabetes mellitus without complication ([place])   . Esophageal cancer ([place]) dx'd 2017  . LBBB (left bundle branch block) 02/11/2016  . Reflux     SURGICAL HISTORY: Past Surgical History:  Procedure Laterality Date  . EUS N/A 12/05/2015   Procedure: UPPER ENDOSCOPIC ULTRASOUND (EUS) LINEAR;  Surgeon: Carol Ada, MD;  Location: [facility];  Service: Endoscopy;  Laterality: N/A;  . EUS N/A 03/10/2016    Procedure: UPPER ENDOSCOPIC ULTRASOUND (EUS) LINEAR;  Surgeon: Carol Ada, MD;  Location: [facility];  Service: Endoscopy;  Laterality: N/A;  . EUS N/A 01/07/2017   Procedure: UPPER ENDOSCOPIC ULTRASOUND (EUS) LINEAR;  Surgeon: Carol Ada, MD;  Location: [facility];  Service: Endoscopy;  Laterality: N/A;  . HERNIA REPAIR    . IR GENERIC HISTORICAL  12/24/2015   IR FLUORO GUIDE PORT INSERTION RIGHT 12/24/2015 Arne Cleveland, MD [facility]  . IR GENERIC HISTORICAL  12/24/2015   IR US GUIDE VASC ACCESS RIGHT 12/24/2015 Arne Cleveland, MD [facility]  . SHOULDER ARTHROSCOPY W/ SUPERIOR LABRAL ANTERIOR POSTERIOR LESION REPAIR      SOCIAL HISTORY: Social History   Socioeconomic History  . Marital status: Married    Spouse name: Not on file  . Number of children: Not on file  . Years of education: Not on file  . Highest education level: Not on file  Social Needs  . Financial resource strain: Not on file  . Food insecurity - worry: Not on file  .  Food insecurity - inability: Not on file  . Transportation needs - medical: Not on file  . Transportation needs - non-medical: Not on file  Occupational History  . Not on file  Tobacco Use  . Smoking status: Former Smoker    Packs/day: 2.00    Years: 40.00    Pack years: 80.00    Last attempt to quit: 03/31/1999    Years since quitting: 18.1  . Smokeless tobacco: Never Used  Substance and Sexual Activity  . Alcohol use: No    Comment: weekend binge drinking for 30-40 years, quit in 2001  . Drug use: No  . Sexual activity: Not on file  Other Topics Concern  . 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 Colon cancer Father 79  . Colon cancer Brother 1  . Heart attack Brother   . Heart  disease Mother   . Heart attack Mother   . Diabetes Sister   . Stroke Sister     ALLERGIES:  has No Known Allergies.  MEDICATIONS:  Current Outpatient Medications  Medication Sig Dispense Refill  . alfuzosin (UROXATRAL) 10 MG 24 hr tablet Take 10 mg by mouth daily with breakfast.    . aspirin EC 81 MG tablet Take 81 mg by mouth daily.    Marland Kitchen atorvastatin (LIPITOR) 20 MG tablet Take 20 mg by mouth at bedtime.     . gabapentin (NEURONTIN) 300 MG capsule Take 300 mg by mouth 2 (two) times daily.    . metFORMIN (GLUCOPHAGE) 500 MG tablet Take 500 mg by mouth 2 (two) times daily with a meal. States takes sometimes    . oxyCODONE (ROXICODONE) 15 MG immediate release tablet Take 1 tablet (15 mg total) by mouth every 6 (six) hours as needed for pain. 30 tablet 0  . pantoprazole (PROTONIX) 40 MG tablet Take 1 tablet (40 mg total) by mouth daily. 30 tablet 0  . sodium chloride (OCEAN) 0.65 % SOLN nasal spray Place 1 spray into both nostrils as needed for congestion. 30 mL 2  . amitriptyline (ELAVIL) 10 MG tablet Take 10 mg by mouth at bedtime.      No current facility-administered medications for this visit.     REVIEW OF SYSTEMS:   Constitutional: Denies fevers, chills or abnormal night sweats. Denies fatigue.  Eyes: Denies blurriness of vision, double vision or watery eyes Ears, nose, mouth, throat, and face: Denies mucositis or dysphagia  Respiratory: Denies cough, dyspnea or wheezes (+) DOE Cardiovascular: Denies palpitation, chest discomfort or lower extremity swelling Gastrointestinal:  Denies nausea or change in bowel habits  Skin: Denies abnormal skin rashes Musculoskeletal: (+) arthritis, left hip pain  Lymphatics: Denies new lymphadenopathy or easy bruising Neurological:Denies numbness, tingling or new weaknesses Behavioral/Psych: Mood is stable, no new changes  All other systems were reviewed with the patient and are negative.  PHYSICAL EXAMINATION:  ECOG PERFORMANCE STATUS: 1 -  Symptomatic but completely ambulatory  Vitals:   05/17/17 1418  BP: 139/79  Pulse: 80  Resp: 20  Temp: 98.7 F (37.1 C)  SpO2: 100%   Filed Weights   05/17/17 1418  Weight: 199 lb (90.3 kg)    GENERAL:alert, no distress and comfortable SKIN: skin color, texture,  turgor are normal, no rashes or significant lesions EYES: normal, conjunctiva are pink and non-injected, sclera clear OROPHARYNX:no exudate, no erythema and lips, buccal mucosa, and tongue normal  NECK: supple, thyroid normal size, non-tender, without nodularity LYMPH:  no palpable lymphadenopathy in the cervical, axillary or inguinal LUNGS: clear to auscultation and percussion with normal breathing effort HEART: regular rate & rhythm and no murmurs and no lower extremity edema ABDOMEN:abdomen soft, non-tender and normal bowel sounds Musculoskeletal:no cyanosis of digits and no clubbing  PSYCH: alert & oriented x 3 with fluent speech NEURO: no focal motor/sensory deficits  LABORATORY DATA:  I have reviewed the data as listed CBC Latest Ref Rng & Units 05/10/2017 03/15/2017 11/04/2016  WBC 4.0 - 10.3 K/uL 3.5(L) 2.8(L) 5.7  Hemoglobin 13.0 - 17.1 g/dL 11.8(L) 10.9(L) 12.0(L)  Hematocrit 38.4 - 49.9 % 38.9 34.9(L) 38.2(L)  Platelets 140 - 400 K/uL 275 242 253   CMP Latest Ref Rng & Units 05/10/2017 03/15/2017 11/04/2016  Glucose 70 - 140 mg/dL 108 111 133  BUN 7 - 26 mg/dL 8 6.8(L) 11.3  Creatinine 0.70 - 1.30 mg/dL 0.91 0.8 1.0  Sodium 136 - 145 mmol/L 138 139 139  Potassium 3.5 - 5.1 mmol/L 4.4 3.7 3.8  Chloride 98 - 109 mmol/L 105 - -  CO2 22 - 29 mmol/L 25 24 24   Calcium 8.4 - 10.4 mg/dL 9.0 8.7 9.1  Total Protein 6.4 - 8.3 g/dL 7.2 6.9 7.1  Total Bilirubin 0.2 - 1.2 mg/dL 0.4 0.50 0.26  Alkaline Phos 40 - 150 U/L 88 68 84  AST 5 - 34 U/L 15 43(H) 17  ALT 0 - 55 U/L 10 25 31     PATHOLOGY REPORT  EGD 11/13/16 Final microscopic diagnosis Esophagus, 0.2 cm to 27 cm, biopsy -Invasive squamous cell carcinoma,  moderately differentiated   PROCEDURES  EGD 11/13/2015 Dr. Collene Mares  -A large, fungating, friable mass with bleeding was found in the mid third of the esophagus, 22 cm from the incisors and extended to 27 cm. The mass was nonobstructing and a circumferential, biopsy was taken. -Normal stomach -Normal proximal small bowel  Colonoscopy 11/13/2015 Dr. Collene Mares -Diverticulosis in the entire exam: -No specimens collected  EUS 12/05/2015 Malignant esophageal tumor was found in the upper third of the esophagus and in the middle third of the esophagus. - A mass was found in the cervical esophagus and in the thoracic esophagus. The diagnosis is squamous cell carcinoma. This was staged T3 N1/N2 Mx by endosonographic criteria. - No specimens collected.  EUS 03/10/2016 DR. HUNG  the esohpagus was thickened starting around 23 cm and it extended down in to the lower esophagus. Where the tumor was the thickest, prior to treatment, there was a marked reduction in volume. The thickness decreased from 12.8 mm down to 6-7.7 mm. Again, restaging post treatment is not the most reliable, however, it appears to be a T3. I cannot differentiate between actual tumor versus fibrosis/inflammation. Some peritumoral shoddy lymph nodes were noted. IMPRESSION:  - Benign-appearing esophageal stenosis. - Normal stomach. - Normal examined duodenum. - Wall thickening was seen in the thoracic esophagus. - No specimens collected.   EUS by Dr. Benson Norway 01/07/17 IMPRESSION - Normal esophagus. - Normal stomach. - Normal examined duodenum. - Wall thickening was seen in the upper third of the esophagus and in the middle third of the esophagus. - One benign lymph node was visualized in the middle paraesophageal mediastinum (level 13M). - No specimens collected.   RADIOGRAPHIC STUDIES: I have  personally reviewed the radiological images as listed and agreed with the findings in the report. Ct Chest W Contrast  Result Date:  05/10/2017 CLINICAL DATA:  Esophageal cancer.  Status post chemo radiation EXAM: CT CHEST, ABDOMEN, AND PELVIS WITH CONTRAST TECHNIQUE: Multidetector CT imaging of the chest, abdomen and pelvis was performed following the standard protocol during bolus administration of intravenous contrast. CONTRAST:  11mL ISOVUE-300 IOPAMIDOL (ISOVUE-300) INJECTION 61% COMPARISON:  PET-CT 11/07/2016. Chest CT 08/19/2016. Chest/abdomen/pelvis CT 06/01/2016. FINDINGS: CT CHEST FINDINGS Cardiovascular: The heart size is normal. No pericardial effusion. Coronary artery calcification is evident. Port-A-Cath tip is in the distal esophagus. Mediastinum/Nodes: No mediastinal lymphadenopathy. There is no hilar lymphadenopathy. No esophageal wall thickening in this individual known to have malignant neoplasm involving the middle third of the esophagus. No fluid or edema in the mediastinum. There is no axillary lymphadenopathy. Lungs/Pleura: Centrilobular emphysema evident. 4 mm right upper lobe pulmonary nodule similar to prior chest CT no suspicious pulmonary nodule or mass. No focal airspace consolidation. Musculoskeletal: Bone windows reveal no worrisome lytic or sclerotic osseous lesions. CT ABDOMEN PELVIS FINDINGS Hepatobiliary: 11 mm focus of hypo attenuation is identified in the medial segment left liver adjacent to the falciform ligament. This is likely related to focal fatty deposition, but close attention on follow-up recommended. Liver parenchyma otherwise unremarkable. There is no evidence for gallstones, gallbladder wall thickening, or pericholecystic fluid. No intrahepatic or extrahepatic biliary dilation. Pancreas: Pancreas divisum anatomy. No focal mass lesion or dilatation of the main duct. Spleen: Calcified granulomata. Adrenals/Urinary Tract: No adrenal nodule or mass. Similar appearance small cyst left kidney. Right kidney unremarkable. Stomach/Bowel: Stomach is nondistended. No gastric wall thickening. No evidence of  outlet obstruction. Duodenum is normally positioned as is the ligament of Treitz. Visualized small bowel loops and colonic segments are unremarkable. Vascular/Lymphatic: There is abdominal aortic atherosclerosis without aneurysm. There is no gastrohepatic or hepatoduodenal ligament lymphadenopathy. No intraperitoneal or retroperitoneal lymphadenopathy. Other: No intraperitoneal free fluid. Musculoskeletal: Bone windows reveal no worrisome lytic or sclerotic osseous lesions. IMPRESSION: 1. No new or progressive findings to suggest recurrent or metastatic disease in the chest or abdomen on today's study. 2.  Aortic Atherosclerois (ICD10-170.0) Electronically Signed   By: Misty Stanley M.D.   On: 05/10/2017 10:34   Ct Abdomen W Contrast  Result Date: 05/10/2017 CLINICAL DATA:  Esophageal cancer.  Status post chemo radiation EXAM: CT CHEST, ABDOMEN, AND PELVIS WITH CONTRAST TECHNIQUE: Multidetector CT imaging of the chest, abdomen and pelvis was performed following the standard protocol during bolus administration of intravenous contrast. CONTRAST:  180mL ISOVUE-300 IOPAMIDOL (ISOVUE-300) INJECTION 61% COMPARISON:  PET-CT 11/07/2016. Chest CT 08/19/2016. Chest/abdomen/pelvis CT 06/01/2016. FINDINGS: CT CHEST FINDINGS Cardiovascular: The heart size is normal. No pericardial effusion. Coronary artery calcification is evident. Port-A-Cath tip is in the distal esophagus. Mediastinum/Nodes: No mediastinal lymphadenopathy. There is no hilar lymphadenopathy. No esophageal wall thickening in this individual known to have malignant neoplasm involving the middle third of the esophagus. No fluid or edema in the mediastinum. There is no axillary lymphadenopathy. Lungs/Pleura: Centrilobular emphysema evident. 4 mm right upper lobe pulmonary nodule similar to prior chest CT no suspicious pulmonary nodule or mass. No focal airspace consolidation. Musculoskeletal: Bone windows reveal no worrisome lytic or sclerotic osseous lesions.  CT ABDOMEN PELVIS FINDINGS Hepatobiliary: 11 mm focus of hypo attenuation is identified in the medial segment left liver adjacent to the falciform ligament. This is likely related to focal fatty deposition, but close attention on follow-up recommended. Liver parenchyma otherwise unremarkable.  There is no evidence for gallstones, gallbladder wall thickening, or pericholecystic fluid. No intrahepatic or extrahepatic biliary dilation. Pancreas: Pancreas divisum anatomy. No focal mass lesion or dilatation of the main duct. Spleen: Calcified granulomata. Adrenals/Urinary Tract: No adrenal nodule or mass. Similar appearance small cyst left kidney. Right kidney unremarkable. Stomach/Bowel: Stomach is nondistended. No gastric wall thickening. No evidence of outlet obstruction. Duodenum is normally positioned as is the ligament of Treitz. Visualized small bowel loops and colonic segments are unremarkable. Vascular/Lymphatic: There is abdominal aortic atherosclerosis without aneurysm. There is no gastrohepatic or hepatoduodenal ligament lymphadenopathy. No intraperitoneal or retroperitoneal lymphadenopathy. Other: No intraperitoneal free fluid. Musculoskeletal: Bone windows reveal no worrisome lytic or sclerotic osseous lesions. IMPRESSION: 1. No new or progressive findings to suggest recurrent or metastatic disease in the chest or abdomen on today's study. 2.  Aortic Atherosclerois (ICD10-170.0) Electronically Signed   By: Misty Stanley M.D.   On: 05/10/2017 10:34   CT CAP W Contrast 05/10/17 IMPRESSION: 1. No new or progressive findings to suggest recurrent or metastatic disease in the chest or abdomen on today's study. 2.  Aortic Atherosclerois (ICD10-170.0)  PET 11/07/16  IMPRESSION: Negative PET-CT. No findings for recurrent esophageal cancer or metastatic disease.   ASSESSMENT & PLAN: 77 y.o.  gentleman, without significant past medical history except acid reflux, presented with progressive dysphagia and  odynophagia and weight loss.  1. Esophageal cancer, squamous cell carcinoma, cT3N1-2M0, stage IIIB -I previously reviewed his EGD findings, biopsy results, and the CT scan findings with patient and his wife in great details -I previously dicussed staging PET scan findings with patient and his wife, the mid esophageal mass is hypermetabolic, no distant metastasis. -EUS revealed a T3 lesion, N1 vs N2, locally advanced disease. 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 Servando Snare has mutually agreed not to pursue esophagectomy due to his advanced age and general health condition -We previously discussed that his esophageal cancer is unlikely cured by chemoradiation alone without surgery. I reviewed his repeated EUS after his chemotherapy and irradiation, which showed good response to chemoradiation, but possible residual tumor. -11/08/16 PET scan was was normal without evidence of recurrence. I did discuss that this would not pick up small residual tumor. I again encouraged him to consider surgery as the likelihood of residual disease is high.  -His 01/07/17 EUS was unremarkable without evidence of residual disease -Labs reviewed, anemia with Hg at 11.8. No signs of bleeding. WBC is 3.5. I will check iron study next visit.  -CT CAP from 05/10/17 revealed no new or progressive findings  to suggest recurrent or metastatic disease in the chest or abdomen. I discussed results with pt and he is pleased -He is clinically doing well overall, will continue surveillance. F/u in 3 months. Restaging scan in 6 months. He knows to call if he has any new symptoms.    2. Dysphagia, resolved  -His dysphagia has mostly resolved after chemotherapy and irradiation -follow up with dietician Pamala Hurry as needed -He has started gaining some weight back  3. History of steroids induced  psychosis -Resolved now. We'll avoid steroids in the future.  4. Leukopenia and anemia -Secondary to chemotherapy radiation -Leukopenia has resolved, he still has mild anemia, overall stable. -Hg fluctuating but overall stable. WBC dropped to 2.8 on 03/15/17 -We'll continue monitoring, no need for blood transfusion  5. Chronic back pain, left hip pain  -I previously  encouraged the patient to follow with primary care physician for back pain -I will not refill his narcotics. -He has developed left hip pain since June 2018, which he contributes to arthritis. He follow up with his PCP. Managed with Gabapentin and oxycodone.   -PET scan in August 2018 was negative for metastasis   PLAN:  -I provided a letter to pt about taking off work for this appointment today -F/u in 3 months with lab  -order scan next visit to be done in 6 months    All questions were answered. The patient knows to call the clinic with any problems, questions or concerns.  I spent 20 minutes counseling the patient face to face. The total time spent in the appointment was 25 minutes and more than 50% was on counseling.  This document serves as a record of services personally performed by [name], MD. It was created on her behalf by Theresia Bough, a trained medical scribe. The creation of this record is based on the scribe's personal observations and the provider's statements to them.   I have reviewed the above documentation for accuracy and completeness, and I agree with the above.    [name], MD 05/17/2017

## 2017-05-17 ENCOUNTER — Encounter: Payer: Self-pay | Admitting: Hematology

## 2017-05-17 ENCOUNTER — Telehealth: Payer: Self-pay | Admitting: Hematology

## 2017-05-17 ENCOUNTER — Inpatient Hospital Stay (HOSPITAL_BASED_OUTPATIENT_CLINIC_OR_DEPARTMENT_OTHER): Payer: 59 | Admitting: Hematology

## 2017-05-17 VITALS — BP 139/79 | HR 80 | Temp 98.7°F | Resp 20 | Ht 69.0 in | Wt 199.0 lb

## 2017-05-17 DIAGNOSIS — D6481 Anemia due to antineoplastic chemotherapy: Secondary | ICD-10-CM

## 2017-05-17 DIAGNOSIS — G8929 Other chronic pain: Secondary | ICD-10-CM

## 2017-05-17 DIAGNOSIS — D509 Iron deficiency anemia, unspecified: Secondary | ICD-10-CM | POA: Insufficient documentation

## 2017-05-17 DIAGNOSIS — C154 Malignant neoplasm of middle third of esophagus: Secondary | ICD-10-CM | POA: Diagnosis not present

## 2017-05-17 DIAGNOSIS — M25552 Pain in left hip: Secondary | ICD-10-CM | POA: Diagnosis not present

## 2017-05-17 NOTE — Telephone Encounter (Signed)
Scheduled appt per 2/18 los - Gave patient AVS and calender per los.

## 2017-06-15 DIAGNOSIS — G609 Hereditary and idiopathic neuropathy, unspecified: Secondary | ICD-10-CM | POA: Diagnosis not present

## 2017-06-15 DIAGNOSIS — E1165 Type 2 diabetes mellitus with hyperglycemia: Secondary | ICD-10-CM | POA: Diagnosis not present

## 2017-06-15 DIAGNOSIS — E782 Mixed hyperlipidemia: Secondary | ICD-10-CM | POA: Diagnosis not present

## 2017-06-22 DIAGNOSIS — M1612 Unilateral primary osteoarthritis, left hip: Secondary | ICD-10-CM | POA: Diagnosis not present

## 2017-06-22 DIAGNOSIS — M25552 Pain in left hip: Secondary | ICD-10-CM | POA: Diagnosis not present

## 2017-06-22 DIAGNOSIS — G609 Hereditary and idiopathic neuropathy, unspecified: Secondary | ICD-10-CM | POA: Diagnosis not present

## 2017-06-22 DIAGNOSIS — E782 Mixed hyperlipidemia: Secondary | ICD-10-CM | POA: Diagnosis not present

## 2017-06-22 DIAGNOSIS — E1165 Type 2 diabetes mellitus with hyperglycemia: Secondary | ICD-10-CM | POA: Diagnosis not present

## 2017-06-24 ENCOUNTER — Telehealth: Payer: Self-pay

## 2017-06-24 NOTE — Telephone Encounter (Signed)
Patient called inquiring whether he had a flu shot at [facility] this season. Patient made aware of no evidence of influenza vaccine from [facility]. Patient verbalized understanding.

## 2017-06-30 DIAGNOSIS — M25552 Pain in left hip: Secondary | ICD-10-CM | POA: Diagnosis not present

## 2017-06-30 DIAGNOSIS — M1612 Unilateral primary osteoarthritis, left hip: Secondary | ICD-10-CM | POA: Diagnosis not present

## 2017-07-06 ENCOUNTER — Telehealth: Payer: Self-pay | Admitting: *Deleted

## 2017-07-06 NOTE — Telephone Encounter (Signed)
   [facility] Pre-operative Risk Assessment    Request for surgical clearance:  1. What type of surgery is being performed? Left hip: left THA-DA    2. When is this surgery scheduled? 07/22/17   3. What type of clearance is required (medical clearance vs. Pharmacy clearance to hold med vs. Both)? both  4. Are there any medications that need to be held prior to surgery and how long?Aspirin   5. Practice name and name of physician performing surgery? [facility] Dr. Lyla Glassing   6. What is your office phone and fax number? [phone]   7. Anesthesia type (None, local, MAC, general) ? Spinal anesthesia   Juan [name] Juan [name] 07/06/2017, 11:37 AM  _________________________________________________________________   (provider comments below)

## 2017-07-06 NOTE — Telephone Encounter (Signed)
Pt has been scheduled to see Jory Sims for preop evaluation on   07/21/17.

## 2017-07-07 ENCOUNTER — Ambulatory Visit: Payer: Self-pay | Admitting: Orthopedic Surgery

## 2017-07-13 ENCOUNTER — Ambulatory Visit: Payer: Self-pay | Admitting: Orthopedic Surgery

## 2017-07-13 NOTE — H&P (View-Only) (Signed)
TOTAL HIP ADMISSION H&P  Patient is admitted for left total hip arthroplasty.  Subjective:  Chief Complaint: left hip pain  HPI: Juan [name], 77 y.o. male, has a history of pain and functional disability in the left hip(s) due to arthritis and patient has failed non-surgical conservative treatments for greater than 12 weeks to include NSAID's and/or analgesics, flexibility and strengthening excercises, use of assistive devices and activity modification.  Onset of symptoms was gradual starting 2 years ago with rapidlly worsening course since that time.The patient noted no past surgery on the left hip(s).  Patient currently rates pain in the left hip at 10 out of 10 with activity. Patient has night pain, worsening of pain with activity and weight bearing, trendelenberg gait, pain that interfers with activities of daily living, pain with passive range of motion and crepitus. Patient has evidence of subchondral cysts, subchondral sclerosis, periarticular osteophytes and joint space narrowing by imaging studies. This condition presents safety issues increasing the risk of falls. There is no current active infection.  Patient Active Problem List   Diagnosis Date Noted  . Iron deficiency anemia 05/17/2017  . LBBB (left bundle branch block) 02/11/2016  . Port catheter in place 12/25/2015  . 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 Esophageal cancer ([place]) 11/22/2015  . Noise effect on both inner ears 08/29/2015  . Right thyroid nodule 08/29/2015   Past Medical History:  Diagnosis Date  . Diabetes mellitus without complication ([place])   . Esophageal cancer ([place]) dx'd 2017  . LBBB (left bundle branch block) 02/11/2016  . Reflux     Past Surgical History:  Procedure Laterality Date  . EUS N/A 12/05/2015   Procedure: UPPER ENDOSCOPIC ULTRASOUND (EUS) LINEAR;  Surgeon: Carol Ada, MD;  Location: [facility];  Service: Endoscopy;  Laterality: N/A;  . EUS N/A  03/10/2016   Procedure: UPPER ENDOSCOPIC ULTRASOUND (EUS) LINEAR;  Surgeon: Carol Ada, MD;  Location: [facility];  Service: Endoscopy;  Laterality: N/A;  . EUS N/A 01/07/2017   Procedure: UPPER ENDOSCOPIC ULTRASOUND (EUS) LINEAR;  Surgeon: Carol Ada, MD;  Location: [place];  Service: Endoscopy;  Laterality: N/A;  . HERNIA REPAIR    . IR GENERIC HISTORICAL  12/24/2015   IR FLUORO GUIDE PORT INSERTION RIGHT 12/24/2015 Arne Cleveland, MD [facility]  . IR GENERIC HISTORICAL  12/24/2015   IR US GUIDE VASC ACCESS RIGHT 12/24/2015 Arne Cleveland, MD [facility]  . SHOULDER ARTHROSCOPY W/ SUPERIOR LABRAL ANTERIOR POSTERIOR LESION REPAIR      Current Outpatient Medications  Medication Sig Dispense Refill Last Dose  . alfuzosin (UROXATRAL) 10 MG 24 hr tablet Take 10 mg by mouth daily with breakfast.   Taking  . amitriptyline (ELAVIL) 10 MG tablet Take 10 mg by mouth at bedtime.    Taking  . aspirin EC 81 MG tablet Take 81 mg by mouth daily.   Taking  . atorvastatin (LIPITOR) 20 MG tablet Take 20 mg by mouth at bedtime.    Taking  . gabapentin (NEURONTIN) 300 MG capsule Take 300 mg by mouth 2 (two) times daily.   Taking  . metFORMIN (GLUCOPHAGE) 500 MG tablet Take 500 mg by mouth 2 (two) times daily with a meal. States takes sometimes   Taking  . oxyCODONE (ROXICODONE) 15 MG immediate release tablet Take 1 tablet (15 mg total) by mouth every 6 (six) hours as needed for pain. 30 tablet 0 Taking  . pantoprazole (PROTONIX) 40 MG tablet Take 1 tablet (40  mg total) by mouth daily. 30 tablet 0 Taking  . sodium chloride (OCEAN) 0.65 % SOLN nasal spray Place 1 spray into both nostrils as needed for congestion. 30 mL 2 Taking   No current facility-administered medications for this visit.    No Known Allergies  Social History   Tobacco Use  . Smoking status: Former Smoker    Packs/day: 2.00    Years: 40.00    Pack years: 80.00    Last attempt to quit: 03/31/1999    Years since quitting:  18.2  . Smokeless tobacco: Never Used  Substance Use Topics  . Alcohol use: No    Comment: weekend binge drinking for 30-40 years, quit in 2001    Family History  Problem Relation Age of Onset  . Colon cancer Father 38  . Colon cancer Brother 90  . Heart attack Brother   . Heart disease Mother   . Heart attack Mother   . Diabetes Sister   . Stroke Sister      Review of Systems  Constitutional: Negative.   HENT: Negative.   Eyes: Negative.   Respiratory: Negative.   Cardiovascular: Negative.   Gastrointestinal: Positive for heartburn.  Genitourinary: Negative.   Musculoskeletal: Positive for back pain and joint pain.  Skin: Negative.   Neurological: Positive for dizziness.  Endo/Heme/Allergies: Negative.   Psychiatric/Behavioral: Negative.     Objective:  Physical Exam  Vitals reviewed. Constitutional: He is oriented to person, place, and time. He appears well-developed and well-nourished.  HENT:  Head: Normocephalic and atraumatic.  Eyes: Pupils are equal, round, and reactive to light. Conjunctivae and EOM are normal.  Neck: Normal range of motion. Neck supple.  Cardiovascular: Normal rate, regular rhythm and intact distal pulses.  Respiratory: Effort normal. No respiratory distress.  GI: Soft. He exhibits no distension.  Genitourinary:  Genitourinary Comments: deferred  Musculoskeletal:       Left hip: He exhibits decreased range of motion, decreased strength and bony tenderness.  Neurological: He is alert and oriented to person, place, and time. He has normal reflexes.  Skin: Skin is warm and dry.  Psychiatric: He has a normal mood and affect. His behavior is normal. Judgment and thought content normal.    Vital signs in last 24 hours: @VSRANGES @  Labs:   Estimated body mass index is 29.39 kg/m as calculated from the following:   Height as of 05/17/17: 5\' 9"  (1.753 m).   Weight as of 05/17/17: 90.3 kg (199 lb).   Imaging Review Plain radiographs  demonstrate severe degenerative joint disease of the left hip(s). The bone quality appears to be adequate for age and reported activity level.    Preoperative templating of the joint replacement has been completed, documented, and submitted to the Operating Room personnel in order to optimize intra-operative equipment management.   Anticipated LOS equal to or greater than 2 midnights due to - Age 3 and older with one or more of the following:  - Obesity  - Expected need for hospital services (PT, OT, Nursing) required for safe  discharge  - Anticipated need for postoperative skilled nursing care or inpatient rehab  - Active co-morbidities: Chronic pain requiring opiods OR   - Unanticipated findings during/Post Surgery: None  - Patient is a high risk of re-admission due to: None     Assessment/Plan:  End stage arthritis, left hip(s)  The patient history, physical examination, clinical judgement of the provider and imaging studies are consistent with end stage degenerative joint disease of  the left hip(s) and total hip arthroplasty is deemed medically necessary. The treatment options including medical management, injection therapy, arthroscopy and arthroplasty were discussed at length. The risks and benefits of total hip arthroplasty were presented and reviewed. The risks due to aseptic loosening, infection, stiffness, dislocation/subluxation,  thromboembolic complications and other imponderables were discussed.  The patient acknowledged the explanation, agreed to proceed with the plan and consent was signed. Patient is being admitted for inpatient treatment for surgery, pain control, PT, OT, prophylactic antibiotics, VTE prophylaxis, progressive ambulation and ADL's and discharge planning.The patient is planning to be discharged home with HEP

## 2017-07-13 NOTE — H&P (Signed)
TOTAL HIP ADMISSION H&P  Patient is admitted for left total hip arthroplasty.  Subjective:  Chief Complaint: left hip pain  HPI: Juan [name], 77 y.o. male, has a history of pain and functional disability in the left hip(s) due to arthritis and patient has failed non-surgical conservative treatments for greater than 12 weeks to include NSAID's and/or analgesics, flexibility and strengthening excercises, use of assistive devices and activity modification.  Onset of symptoms was gradual starting 2 years ago with rapidlly worsening course since that time.The patient noted no past surgery on the left hip(s).  Patient currently rates pain in the left hip at 10 out of 10 with activity. Patient has night pain, worsening of pain with activity and weight bearing, trendelenberg gait, pain that interfers with activities of daily living, pain with passive range of motion and crepitus. Patient has evidence of subchondral cysts, subchondral sclerosis, periarticular osteophytes and joint space narrowing by imaging studies. This condition presents safety issues increasing the risk of falls. There is no current active infection.  Patient Active Problem List   Diagnosis Date Noted  . Iron deficiency anemia 05/17/2017  . LBBB (left bundle branch block) 02/11/2016  . Port catheter in place 12/25/2015  . 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 Esophageal cancer ([place]) 11/22/2015  . Noise effect on both inner ears 08/29/2015  . Right thyroid nodule 08/29/2015   Past Medical History:  Diagnosis Date  . Diabetes mellitus without complication ([place])   . Esophageal cancer ([place]) dx'd 2017  . LBBB (left bundle branch block) 02/11/2016  . Reflux     Past Surgical History:  Procedure Laterality Date  . EUS N/A 12/05/2015   Procedure: UPPER ENDOSCOPIC ULTRASOUND (EUS) LINEAR;  Surgeon: Carol Ada, MD;  [facility];  Service: Endoscopy;  Laterality: N/A;  . EUS N/A  03/10/2016   Procedure: UPPER ENDOSCOPIC ULTRASOUND (EUS) LINEAR;  Surgeon: Carol Ada, MD;  [facility];  Service: Endoscopy;  Laterality: N/A;  . EUS N/A 01/07/2017   Procedure: UPPER ENDOSCOPIC ULTRASOUND (EUS) LINEAR;  Surgeon: Carol Ada, MD;  Location: [place];  Service: Endoscopy;  Laterality: N/A;  . HERNIA REPAIR    . IR GENERIC HISTORICAL  12/24/2015   IR FLUORO GUIDE PORT INSERTION RIGHT 12/24/2015 Arne Cleveland, MD [facility]  . IR GENERIC HISTORICAL  12/24/2015   IR US GUIDE VASC ACCESS RIGHT 12/24/2015 Arne Cleveland, MD [facility]  . SHOULDER ARTHROSCOPY W/ SUPERIOR LABRAL ANTERIOR POSTERIOR LESION REPAIR      Current Outpatient Medications  Medication Sig Dispense Refill Last Dose  . alfuzosin (UROXATRAL) 10 MG 24 hr tablet Take 10 mg by mouth daily with breakfast.   Taking  . amitriptyline (ELAVIL) 10 MG tablet Take 10 mg by mouth at bedtime.    Taking  . aspirin EC 81 MG tablet Take 81 mg by mouth daily.   Taking  . atorvastatin (LIPITOR) 20 MG tablet Take 20 mg by mouth at bedtime.    Taking  . gabapentin (NEURONTIN) 300 MG capsule Take 300 mg by mouth 2 (two) times daily.   Taking  . metFORMIN (GLUCOPHAGE) 500 MG tablet Take 500 mg by mouth 2 (two) times daily with a meal. States takes sometimes   Taking  . oxyCODONE (ROXICODONE) 15 MG immediate release tablet Take 1 tablet (15 mg total) by mouth every 6 (six) hours as needed for pain. 30 tablet 0 Taking  . pantoprazole (PROTONIX) 40 MG tablet Take 1 tablet (40  mg total) by mouth daily. 30 tablet 0 Taking  . sodium chloride (OCEAN) 0.65 % SOLN nasal spray Place 1 spray into both nostrils as needed for congestion. 30 mL 2 Taking   No current facility-administered medications for this visit.    No Known Allergies  Social History   Tobacco Use  . Smoking status: Former Smoker    Packs/day: 2.00    Years: 40.00    Pack years: 80.00    Last attempt to quit: 03/31/1999    Years since quitting:  18.2  . Smokeless tobacco: Never Used  Substance Use Topics  . Alcohol use: No    Comment: weekend binge drinking for 30-40 years, quit in 2001    Family History  Problem Relation Age of Onset  . Colon cancer Father 104  . Colon cancer Brother 48  . Heart attack Brother   . Heart disease Mother   . Heart attack Mother   . Diabetes Sister   . Stroke Sister      Review of Systems  Constitutional: Negative.   HENT: Negative.   Eyes: Negative.   Respiratory: Negative.   Cardiovascular: Negative.   Gastrointestinal: Positive for heartburn.  Genitourinary: Negative.   Musculoskeletal: Positive for back pain and joint pain.  Skin: Negative.   Neurological: Positive for dizziness.  Endo/Heme/Allergies: Negative.   Psychiatric/Behavioral: Negative.     Objective:  Physical Exam  Vitals reviewed. Constitutional: He is oriented to person, place, and time. He appears well-developed and well-nourished.  HENT:  Head: Normocephalic and atraumatic.  Eyes: Pupils are equal, round, and reactive to light. Conjunctivae and EOM are normal.  Neck: Normal range of motion. Neck supple.  Cardiovascular: Normal rate, regular rhythm and intact distal pulses.  Respiratory: Effort normal. No respiratory distress.  GI: Soft. He exhibits no distension.  Genitourinary:  Genitourinary Comments: deferred  Musculoskeletal:       Left hip: He exhibits decreased range of motion, decreased strength and bony tenderness.  Neurological: He is alert and oriented to person, place, and time. He has normal reflexes.  Skin: Skin is warm and dry.  Psychiatric: He has a normal mood and affect. His behavior is normal. Judgment and thought content normal.    Vital signs in last 24 hours: @VSRANGES @  Labs:   Estimated body mass index is 29.39 kg/m as calculated from the following:   Height as of 05/17/17: 5\' 9"  (1.753 m).   Weight as of 05/17/17: 90.3 kg (199 lb).   Imaging Review Plain radiographs  demonstrate severe degenerative joint disease of the left hip(s). The bone quality appears to be adequate for age and reported activity level.    Preoperative templating of the joint replacement has been completed, documented, and submitted to the Operating Room personnel in order to optimize intra-operative equipment management.   Anticipated LOS equal to or greater than 2 midnights due to - Age 53 and older with one or more of the following:  - Obesity  - Expected need for hospital services (PT, OT, Nursing) required for safe  discharge  - Anticipated need for postoperative skilled nursing care or inpatient rehab  - Active co-morbidities: Chronic pain requiring opiods OR   - Unanticipated findings during/Post Surgery: None  - Patient is a high risk of re-admission due to: None     Assessment/Plan:  End stage arthritis, left hip(s)  The patient history, physical examination, clinical judgement of the provider and imaging studies are consistent with end stage degenerative joint disease of  the left hip(s) and total hip arthroplasty is deemed medically necessary. The treatment options including medical management, injection therapy, arthroscopy and arthroplasty were discussed at length. The risks and benefits of total hip arthroplasty were presented and reviewed. The risks due to aseptic loosening, infection, stiffness, dislocation/subluxation,  thromboembolic complications and other imponderables were discussed.  The patient acknowledged the explanation, agreed to proceed with the plan and consent was signed. Patient is being admitted for inpatient treatment for surgery, pain control, PT, OT, prophylactic antibiotics, VTE prophylaxis, progressive ambulation and ADL's and discharge planning.The patient is planning to be discharged home with HEP

## 2017-07-20 ENCOUNTER — Telehealth: Payer: Self-pay | Admitting: *Deleted

## 2017-07-20 ENCOUNTER — Encounter (HOSPITAL_COMMUNITY): Payer: Self-pay

## 2017-07-20 DIAGNOSIS — M1612 Unilateral primary osteoarthritis, left hip: Secondary | ICD-10-CM | POA: Diagnosis not present

## 2017-07-20 NOTE — Pre-Procedure Instructions (Signed)
In hard chart: Last office note Dr. Ashby Dawes 06/22/2017 Hgb A1C (6.9) 06/15/2017  The following are in epic: EKG 08/20/2016 CT ABD/Chest 05/10/2017

## 2017-07-20 NOTE — Patient Instructions (Addendum)
Your procedure is scheduled on: Thursday, July 22, 2017   Surgery Time:  12:35PM-2:35PM   Report to [facility]  Entrance    Report to admitting at 10:00 AM   Call this number if you have problems the morning of surgery [phone]   Do not eat food:After Midnight.   Do NOT smoke after Midnight   May have liquids until 6:30AM morning of surgery      CLEAR LIQUID DIET   Foods Allowed                                                                     Foods Excluded  Coffee and tea, regular and decaf                             liquids that you cannot  Plain Jell-O in any flavor                                             see through such as: Fruit ices (not with fruit pulp)                                     milk, soups, orange juice  Iced Popsicles                                    All solid food Carbonated beverages, regular and diet                                    Cranberry, grape and apple juices Sports drinks like Gatorade Lightly seasoned clear broth or consume(fat free) Sugar, honey syrup  Sample Menu Breakfast                                Lunch                                     Supper Cranberry juice                    Beef broth                            Chicken broth Jell-O                                     Grape juice                           Apple juice Coffee or tea  Jell-O                                      Popsicle                                                Coffee or tea                        Coffee or tea  Take these medicines the morning of surgery with A SIP OF WATER: Gabapentin, Pantoprazole   DO NOT TAKE ANY DIABETIC MEDICATIONS DAY OF YOUR SURGERY                               You may not have any metal on your body including  jewelry, and body piercings             Do not wear lotions, powders, perfumes/cologne, or deodorant                          Men may shave face and neck.   Do  not bring valuables to the hospital. [place].   Contacts, dentures or bridgework may not be worn into surgery.   Leave suitcase in the car. After surgery it may be brought to your room.   Special Instructions: Bring a copy of your healthcare power of attorney and living will documents         the day of surgery if you haven't scanned them in before.              Please read over the following fact sheets you were given:  [facility] - Preparing for Surgery Before surgery, you can play an important role.  Because skin is not sterile, your skin needs to be as free of germs as possible.  You can reduce the number of germs on your skin by washing with CHG (chlorahexidine gluconate) soap before surgery.  CHG is an antiseptic cleaner which kills germs and bonds with the skin to continue killing germs even after washing. Please DO NOT use if you have an allergy to CHG or antibacterial soaps.  If your skin becomes reddened/irritated stop using the CHG and inform your nurse when you arrive at Short Stay. Do not shave (including legs and underarms) for at least 48 hours prior to the first CHG shower.  You may shave your face/neck.  Please follow these instructions carefully:  1.  Shower with CHG Soap the night before surgery and the  morning of surgery.  2.  If you choose to wash your hair, wash your hair first as usual with your normal  shampoo.  3.  After you shampoo, rinse your hair and body thoroughly to remove the shampoo.                             4.  Use CHG as you would any other liquid soap.  You can apply chg directly to the skin and wash.  Gently with  a scrungie or clean washcloth.  5.  Apply the CHG Soap to your body ONLY FROM THE NECK DOWN.   Do   not use on face/ open                           Wound or open sores. Avoid contact with eyes, ears mouth and   genitals (private parts).                       Wash face,  Genitals (private  parts) with your normal soap.             6.  Wash thoroughly, paying special attention to the area where your    surgery  will be performed.  7.  Thoroughly rinse your body with warm water from the neck down.  8.  DO NOT shower/wash with your normal soap after using and rinsing off the CHG Soap.                9.  Pat yourself dry with a clean towel.            10.  Wear clean pajamas.            11.  Place clean sheets on your bed the night of your first shower and do not  sleep with pets. Day of Surgery : Do not apply any lotions/deodorants the morning of surgery.  Please wear clean clothes to the hospital/surgery center.  FAILURE TO FOLLOW THESE INSTRUCTIONS MAY RESULT IN THE CANCELLATION OF YOUR SURGERY  PATIENT SIGNATURE_________________________________  NURSE SIGNATURE__________________________________  ________________________________________________________________________   Adam Phenix  An incentive spirometer is a tool that can help keep your lungs clear and active. This tool measures how well you are filling your lungs with each breath. Taking long deep breaths may help reverse or decrease the chance of developing breathing (pulmonary) problems (especially infection) following:  A long period of time when you are unable to move or be active. BEFORE THE PROCEDURE   If the spirometer includes an indicator to show your best effort, your nurse or respiratory therapist will set it to a desired goal.  If possible, sit up straight or lean slightly forward. Try not to slouch.  Hold the incentive spirometer in an upright position. INSTRUCTIONS FOR USE  1. Sit on the edge of your bed if possible, or sit up as far as you can in bed or on a chair. 2. Hold the incentive spirometer in an upright position. 3. Breathe out normally. 4. Place the mouthpiece in your mouth and seal your lips tightly around it. 5. Breathe in slowly and as deeply as possible, raising the piston or  the ball toward the top of the column. 6. Hold your breath for 3-5 seconds or for as long as possible. Allow the piston or ball to fall to the bottom of the column. 7. Remove the mouthpiece from your mouth and breathe out normally. 8. Rest for a few seconds and repeat Steps 1 through 7 at least 10 times every 1-2 hours when you are awake. Take your time and take a few normal breaths between deep breaths. 9. The spirometer may include an indicator to show your best effort. Use the indicator as a goal to work toward during each repetition. 10. After each set of 10 deep breaths, practice coughing to be sure your lungs are clear. If you have an incision (the cut  made at the time of surgery), support your incision when coughing by placing a pillow or rolled up towels firmly against it. Once you are able to get out of bed, walk around indoors and cough well. You may stop using the incentive spirometer when instructed by your caregiver.  RISKS AND COMPLICATIONS  Take your time so you do not get dizzy or light-headed.  If you are in pain, you may need to take or ask for pain medication before doing incentive spirometry. It is harder to take a deep breath if you are having pain. AFTER USE  Rest and breathe slowly and easily.  It can be helpful to keep track of a log of your progress. Your caregiver can provide you with a simple table to help with this. If you are using the spirometer at home, follow these instructions: Coffee IF:   You are having difficultly using the spirometer.  You have trouble using the spirometer as often as instructed.  Your pain medication is not giving enough relief while using the spirometer.  You develop fever of 100.5 F (38.1 C) or higher. SEEK IMMEDIATE MEDICAL CARE IF:   You cough up bloody sputum that had not been present before.  You develop fever of 102 F (38.9 C) or greater.  You develop worsening pain at or near the incision site. MAKE SURE  YOU:   Understand these instructions.  Will watch your condition.  Will get help right away if you are not doing well or get worse. Document Released: 07/27/2006 Document Revised: 06/08/2011 Document Reviewed: 09/27/2006 ExitCare Patient Information 2014 Exit[employer], [place].   ________________________________________________________________________  WHAT IS A BLOOD TRANSFUSION? Blood Transfusion Information  A transfusion is the replacement of blood or some of its parts. Blood is made up of multiple cells which provide different functions.  Red blood cells carry oxygen and are used for blood loss replacement.  White blood cells fight against infection.  Platelets control bleeding.  Plasma helps clot blood.  Other blood products are available for specialized needs, such as hemophilia or other clotting disorders. BEFORE THE TRANSFUSION  Who gives blood for transfusions?   Healthy volunteers who are fully evaluated to make sure their blood is safe. This is blood bank blood. Transfusion therapy is the safest it has ever been in the practice of medicine. Before blood is taken from a donor, a complete history is taken to make sure that person has no history of diseases nor engages in risky social behavior (examples are intravenous drug use or sexual activity with multiple partners). The donor's travel history is screened to minimize risk of transmitting infections, such as malaria. The donated blood is tested for signs of infectious diseases, such as HIV and hepatitis. The blood is then tested to be sure it is compatible with you in order to minimize the chance of a transfusion reaction. If you or a relative donates blood, this is often done in anticipation of surgery and is not appropriate for emergency situations. It takes many days to process the donated blood. RISKS AND COMPLICATIONS Although transfusion therapy is very safe and saves many lives, the main dangers of transfusion include:    Getting an infectious disease.  Developing a transfusion reaction. This is an allergic reaction to something in the blood you were given. Every precaution is taken to prevent this. The decision to have a blood transfusion has been considered carefully by your caregiver before blood is given. Blood is not given unless the benefits  outweigh the risks. AFTER THE TRANSFUSION  Right after receiving a blood transfusion, you will usually feel much better and more energetic. This is especially true if your red blood cells have gotten low (anemic). The transfusion raises the level of the red blood cells which carry oxygen, and this usually causes an energy increase.  The nurse administering the transfusion will monitor you carefully for complications. HOME CARE INSTRUCTIONS  No special instructions are needed after a transfusion. You may find your energy is better. Speak with your caregiver about any limitations on activity for underlying diseases you may have. SEEK MEDICAL CARE IF:   Your condition is not improving after your transfusion.  You develop redness or irritation at the intravenous (IV) site. SEEK IMMEDIATE MEDICAL CARE IF:  Any of the following symptoms occur over the next 12 hours:  Shaking chills.  You have a temperature by mouth above 102 F (38.9 C), not controlled by medicine.  Chest, back, or muscle pain.  People around you feel you are not acting correctly or are confused.  Shortness of breath or difficulty breathing.  Dizziness and fainting.  You get a rash or develop hives.  You have a decrease in urine output.  Your urine turns a dark color or changes to pink, red, or brown. Any of the following symptoms occur over the next 10 days:  You have a temperature by mouth above 102 F (38.9 C), not controlled by medicine.  Shortness of breath.  Weakness after normal activity.  The white part of the eye turns yellow (jaundice).  You have a decrease in the  amount of urine or are urinating less often.  Your urine turns a dark color or changes to pink, red, or brown. Document Released: 03/13/2000 Document Revised: 06/08/2011 Document Reviewed: 10/31/2007 [facility] Patient Information 2014 [place], [place].  _______________________________________________________________________

## 2017-07-20 NOTE — Telephone Encounter (Signed)
Received vm message from pt stating that he needs to cancel his appt for 08/13/17 b/c he is scheduled for hip replacement this thurs at [facility] & will therefore be unable to make Dr Ernestina Penna appt.  He states that he is doing well in regard to his cancer but having a lot of pain due to hip problem & arthritis.  Message routed to Dr Burr Medico.

## 2017-07-20 NOTE — Progress Notes (Signed)
Cardiology Office Note   Date:  07/21/2017   ID:  Juan [name], DOB 1940/08/15, MRN [id number]  PCP:  Juan [name]), MD (Inactive)  Cardiologist:  Dr. Oval Linsey   Chief Complaint  Patient presents with  . Pre-op Exam     History of Present Illness: Juan [name] is a 77 y.o. male who presents for pre-operative cardiology clearance for total left hip replacement.  She has a history of squamous cell carcinoma of the esophagus, chronic LBBB, GERD.  The patient was last seen by Dr. Oval Linsey on 02/11/2016 for preoperative evaluation prior to throat surgery and was released to move forward with this.  It was noted that the patient does have known left bundle branch block or symptoms of ischemia and therefore no cardiac testing was planned.  The patient is here without any cardiac complaints.  He has significant pain in his left hip and is been looking forward to having surgery for a few months now.  The patient is closely monitored by his PCP concerning cholesterol status and diabetic status.  The patient has had recent changes in his medication by his primary care taking him off of metformin and changing him to glipizide.  The patient denies any significant fatigue, dyspnea on exertion, or palpitations.  He does admit that he is not been as active with his left hip pain and is now using a walker for ambulation.  Past Medical History:  Diagnosis Date  . Aortic atherosclerosis (Juan [name])   . Back pain   . Barrett esophagus   . Bleeding nose   . DDD (degenerative disc disease), thoracolumbar    Moderate to severe  . Diabetes mellitus without complication ([place])   . Diverticulosis   . Esophageal cancer ([place]) dx'd 2017  . Hearing loss   . Idiopathic peripheral neuropathy   . Iron deficiency anemia   . LBBB (left bundle branch block) 02/11/2016  . Left hip pain    Severe degenerative changes  . Lipoma of neck    Right  . Mixed hyperlipidemia   . Reflux   . Right thyroid nodule      Past Surgical History:  Procedure Laterality Date  . EUS N/A 12/05/2015   Procedure: UPPER ENDOSCOPIC ULTRASOUND (EUS) LINEAR;  Surgeon: Carol Ada, MD;  Location: [facility] ENDOSCOPY;  Service: Endoscopy;  Laterality: N/A;  . EUS N/A 03/10/2016   Procedure: UPPER ENDOSCOPIC ULTRASOUND (EUS) LINEAR;  Surgeon: Carol Ada, MD;  Location: [facility] ENDOSCOPY;  Service: Endoscopy;  Laterality: N/A;  . EUS N/A 01/07/2017   Procedure: UPPER ENDOSCOPIC ULTRASOUND (EUS) LINEAR;  Surgeon: Carol Ada, MD;  Location: [facility];  Service: Endoscopy;  Laterality: N/A;  . HERNIA REPAIR    . IR GENERIC HISTORICAL  12/24/2015   IR FLUORO GUIDE PORT INSERTION RIGHT 12/24/2015 Arne Cleveland, MD [facility]-INTERV RAD  . IR GENERIC HISTORICAL  12/24/2015   IR US GUIDE VASC ACCESS RIGHT 12/24/2015 Arne Cleveland, MD [facility]INTER[facility]  . PORTA CATH INSERTION    . SHOULDER ARTHROSCOPY W/ SUPERIOR LABRAL ANTERIOR POSTERIOR LESION REPAIR    . UPPER GI ENDOSCOPY  01/07/2017     Current Outpatient Medications  Medication Sig Dispense Refill  . alfuzosin (UROXATRAL) 10 MG 24 hr tablet Take 10 mg by mouth daily with breakfast.    . aspirin EC 81 MG tablet Take 81 mg by mouth daily.    Marland Kitchen atorvastatin (LIPITOR) 20 MG tablet Take 20 mg by mouth at bedtime.     . gabapentin (NEURONTIN) 300  MG capsule Take 300 mg by mouth 2 (two) times daily.    Marland Kitchen glimepiride (AMARYL) 1 MG tablet Take 1 mg by mouth daily with breakfast.    . oxyCODONE (ROXICODONE) 15 MG immediate release tablet Take 1 tablet (15 mg total) by mouth every 6 (six) hours as needed for pain. (Patient taking differently: Take 15 mg by mouth 3 (three) times daily as needed for pain. ) 30 tablet 0  . pantoprazole (PROTONIX) 40 MG tablet Take 1 tablet (40 mg total) by mouth daily. 30 tablet 0  . prochlorperazine (COMPAZINE) 10 MG tablet Take 10 mg by mouth every 6 (six) hours as needed for nausea or vomiting.    . sodium chloride (OCEAN) 0.65 % SOLN nasal spray Place 1 spray  into both nostrils as needed for congestion. 30 mL 2  . amitriptyline (ELAVIL) 10 MG tablet Take 10 mg by mouth at bedtime.      No current facility-administered medications for this visit.     Allergies:   Patient has no known allergies.    Social History:  The patient  reports that he quit smoking about 18 years ago. He has a 80.00 pack-year smoking history. He has never used smokeless tobacco. He reports that he does not drink alcohol or use drugs.   Family History:  The patient's family history includes Colon cancer (age of onset: 60) in his brother; Colon cancer (age of onset: 109) in his father; Diabetes in his sister; Heart attack in his brother and mother; Heart disease in his mother; Stroke in his sister.    ROS: All other systems are reviewed and negative. Unless otherwise mentioned in H&P    PHYSICAL EXAM: VS:  BP 126/78   Pulse (!) 58   Ht 5\' 9"  (1.753 m)   Wt 196 lb (88.9 kg)   BMI 28.94 kg/m  , BMI Body mass index is 28.94 kg/m. GEN: Well nourished, well developed, in no acute distress  HEENT: normal  Neck: no JVD, carotid bruits, or masses Cardiac: RRR; 1/6 holo-systolic murmurs, rubs, or gallops,no edema  Respiratory:  clear to auscultation bilaterally, normal work of breathing GI: soft, nontender, nondistended, + BS MS: no deformity or atrophy  Skin: warm and dry, no rash Neuro:  Strength and sensation are intact Psych: euthymic mood, full affect   EKG: Sinus bradycardia, left bundle branch block (chronic), left axis deviation, heart rate of 58 bpm.  Essentially on change from EKG on 07/2016 with the exception of the left axis deviation.  Recent Labs: 08/18/2016: B Natriuretic Peptide 41.8 05/10/2017: ALT 10; BUN 8; Creatinine, Ser 0.91; Hemoglobin 11.8; Platelets 275; Potassium 4.4; Sodium 138    Lipid Panel No results found for: CHOL, TRIG, HDL, CHOLHDL, VLDL, LDLCALC, LDLDIRECT    Wt Readings from Last 3 Encounters:  07/21/17 196 lb (88.9 kg)   05/17/17 199 lb (90.3 kg)  03/15/17 200 lb (90.7 kg)      Other studies Reviewed: CT scan of the chest and abdomen on 05/10/2017 1. No new or progressive findings to suggest recurrent or metastatic disease in the chest or abdomen on today's study. 2.  Aortic Atherosclerois (ICD10-170.0)  On comments concerning cardiovascular it was reported that the patient did have coronary artery calcification.  ASSESSMENT AND PLAN:  1.  Preoperative cardiac evaluation: The patient is essentially asymptomatic from a cardiovascular standpoint with the exception of some mild dyspnea on exertion.  The patient has significant trouble with ambulation due to left hip pain and  degeneration.  He is currently using a walker.  Patient has not had any cardiac testing.  He appears to be a low risk from our standpoint currently as his blood pressure, heart rate, and cholesterol are essentially well controlled.  He is medically compliant.   Chart reviewed as part of pre-operative protocol coverage. Given past medical history and time since last visit, based on ACC/AHA guidelines, Tlaloc Hedger would be at acceptable risk for the planned procedure without further cardiovascular testing. Cardiology will be available perioperatively if necessary.  I would like to see him again in a couple of months post left hip recovery to evaluate further now that he has had repair and has hopefully progress with ambulation.  Cardiovascular risk factors include diabetes and family history of heart disease.  Preoperative cardiac  2.  Hypercholesterolemia:  followed by PCP, the patient is on atorvastatin 20 mg daily.  3.  Diabetes: Followed by PCP.  Current medicines are reviewed at length with the patient today.    Labs/ tests ordered today include: None  Phill Myron. West Pugh, ANP, AACC   07/21/2017 8:20 AM    [facility] [address] [place], [place] [address] Phone: [phone]; Fax: [phone]

## 2017-07-21 ENCOUNTER — Encounter (HOSPITAL_COMMUNITY)
Admission: RE | Admit: 2017-07-21 | Discharge: 2017-07-21 | Disposition: A | Discharge status: Home / Self Care | Payer: 59 | Source: Ambulatory Visit | Attending: Orthopedic Surgery | Admitting: Orthopedic Surgery

## 2017-07-21 ENCOUNTER — Encounter: Payer: Self-pay | Admitting: Adult Health

## 2017-07-21 ENCOUNTER — Encounter (HOSPITAL_COMMUNITY): Payer: Self-pay

## 2017-07-21 ENCOUNTER — Ambulatory Visit (INDEPENDENT_AMBULATORY_CARE_PROVIDER_SITE_OTHER): Payer: 59 | Admitting: Adult Health

## 2017-07-21 ENCOUNTER — Other Ambulatory Visit: Payer: Self-pay

## 2017-07-21 VITALS — BP 126/78 | HR 58 | Ht 69.0 in | Wt 196.0 lb

## 2017-07-21 DIAGNOSIS — E7800 Pure hypercholesterolemia, unspecified: Secondary | ICD-10-CM | POA: Diagnosis not present

## 2017-07-21 DIAGNOSIS — Z96642 Presence of left artificial hip joint: Secondary | ICD-10-CM | POA: Diagnosis not present

## 2017-07-21 DIAGNOSIS — Z471 Aftercare following joint replacement surgery: Secondary | ICD-10-CM | POA: Diagnosis not present

## 2017-07-21 DIAGNOSIS — D509 Iron deficiency anemia, unspecified: Secondary | ICD-10-CM | POA: Diagnosis present

## 2017-07-21 DIAGNOSIS — Z01810 Encounter for preprocedural cardiovascular examination: Secondary | ICD-10-CM

## 2017-07-21 DIAGNOSIS — K08409 Partial loss of teeth, unspecified cause, unspecified class: Secondary | ICD-10-CM | POA: Diagnosis present

## 2017-07-21 DIAGNOSIS — E119 Type 2 diabetes mellitus without complications: Secondary | ICD-10-CM | POA: Diagnosis present

## 2017-07-21 DIAGNOSIS — M1612 Unilateral primary osteoarthritis, left hip: Secondary | ICD-10-CM | POA: Diagnosis present

## 2017-07-21 DIAGNOSIS — K219 Gastro-esophageal reflux disease without esophagitis: Secondary | ICD-10-CM | POA: Diagnosis present

## 2017-07-21 DIAGNOSIS — M5135 Other intervertebral disc degeneration, thoracolumbar region: Secondary | ICD-10-CM | POA: Diagnosis present

## 2017-07-21 DIAGNOSIS — I447 Left bundle-branch block, unspecified: Secondary | ICD-10-CM

## 2017-07-21 DIAGNOSIS — G609 Hereditary and idiopathic neuropathy, unspecified: Secondary | ICD-10-CM | POA: Diagnosis present

## 2017-07-21 DIAGNOSIS — Z8719 Personal history of other diseases of the digestive system: Secondary | ICD-10-CM | POA: Diagnosis not present

## 2017-07-21 DIAGNOSIS — Z7982 Long term (current) use of aspirin: Secondary | ICD-10-CM | POA: Diagnosis not present

## 2017-07-21 DIAGNOSIS — M25752 Osteophyte, left hip: Secondary | ICD-10-CM | POA: Diagnosis present

## 2017-07-21 DIAGNOSIS — Z823 Family history of stroke: Secondary | ICD-10-CM | POA: Diagnosis not present

## 2017-07-21 DIAGNOSIS — Z7984 Long term (current) use of oral hypoglycemic drugs: Secondary | ICD-10-CM | POA: Diagnosis not present

## 2017-07-21 DIAGNOSIS — I498 Other specified cardiac arrhythmias: Secondary | ICD-10-CM

## 2017-07-21 DIAGNOSIS — Z833 Family history of diabetes mellitus: Secondary | ICD-10-CM | POA: Diagnosis not present

## 2017-07-21 DIAGNOSIS — Z9181 History of falling: Secondary | ICD-10-CM | POA: Diagnosis not present

## 2017-07-21 DIAGNOSIS — Z8501 Personal history of malignant neoplasm of esophagus: Secondary | ICD-10-CM | POA: Diagnosis not present

## 2017-07-21 DIAGNOSIS — Z8249 Family history of ischemic heart disease and other diseases of the circulatory system: Secondary | ICD-10-CM | POA: Diagnosis not present

## 2017-07-21 DIAGNOSIS — Z79899 Other long term (current) drug therapy: Secondary | ICD-10-CM | POA: Diagnosis not present

## 2017-07-21 DIAGNOSIS — Z800 Family history of malignant neoplasm of digestive organs: Secondary | ICD-10-CM | POA: Diagnosis not present

## 2017-07-21 DIAGNOSIS — H9191 Unspecified hearing loss, right ear: Secondary | ICD-10-CM | POA: Diagnosis present

## 2017-07-21 DIAGNOSIS — C159 Malignant neoplasm of esophagus, unspecified: Secondary | ICD-10-CM | POA: Diagnosis not present

## 2017-07-21 DIAGNOSIS — E782 Mixed hyperlipidemia: Secondary | ICD-10-CM | POA: Diagnosis present

## 2017-07-21 DIAGNOSIS — Z87891 Personal history of nicotine dependence: Secondary | ICD-10-CM | POA: Diagnosis not present

## 2017-07-21 HISTORY — DX: Nontoxic single thyroid nodule: E04.1

## 2017-07-21 HISTORY — DX: Headache, unspecified: R51.9

## 2017-07-21 HISTORY — DX: Diverticulosis of intestine, part unspecified, without perforation or abscess without bleeding: K57.90

## 2017-07-21 HISTORY — DX: Personal history of other diseases of the digestive system: Z87.19

## 2017-07-21 HISTORY — DX: Nausea with vomiting, unspecified: R11.2

## 2017-07-21 HISTORY — DX: Atherosclerosis of aorta: I70.0

## 2017-07-21 HISTORY — DX: Headache: R51

## 2017-07-21 HISTORY — DX: Unspecified hearing loss, unspecified ear: H91.90

## 2017-07-21 HISTORY — DX: Benign lipomatous neoplasm of skin and subcutaneous tissue of head, face and neck: D17.0

## 2017-07-21 HISTORY — DX: Other specified postprocedural states: Z98.890

## 2017-07-21 HISTORY — DX: Dorsalgia, unspecified: M54.9

## 2017-07-21 HISTORY — DX: Pain in left hip: M25.552

## 2017-07-21 HISTORY — DX: Epistaxis: R04.0

## 2017-07-21 HISTORY — DX: Hereditary and idiopathic neuropathy, unspecified: G60.9

## 2017-07-21 HISTORY — DX: Barrett's esophagus without dysplasia: K22.70

## 2017-07-21 HISTORY — DX: Mixed hyperlipidemia: E78.2

## 2017-07-21 HISTORY — DX: Other intervertebral disc degeneration, thoracolumbar region: M51.35

## 2017-07-21 HISTORY — DX: Iron deficiency anemia, unspecified: D50.9

## 2017-07-21 HISTORY — DX: Gastro-esophageal reflux disease without esophagitis: K21.9

## 2017-07-21 LAB — BASIC METABOLIC PANEL
Anion gap: 10 (ref 5–15)
BUN: 7 mg/dL (ref 6–20)
CO2: 25 mmol/L (ref 22–32)
Calcium: 9.2 mg/dL (ref 8.9–10.3)
Chloride: 103 mmol/L (ref 101–111)
Creatinine, Ser: 0.85 mg/dL (ref 0.61–1.24)
GFR calc Af Amer: >60 mL/min (ref >60)
GFR calc non Af Amer: >60 mL/min (ref >60)
Glucose, Bld: 106 mg/dL — ABNORMAL HIGH (ref 65–99)
Potassium: 4.2 mmol/L (ref 3.5–5.1)
Sodium: 138 mmol/L (ref 135–145)

## 2017-07-21 LAB — ABO/RH: ABO/RH(D): O POS

## 2017-07-21 LAB — SURGICAL PCR SCREEN
MRSA, PCR: NEGATIVE
Staphylococcus aureus: POSITIVE — AB

## 2017-07-21 LAB — CBC
HCT: 39.7 % (ref 39.0–52.0)
Hemoglobin: 12.4 g/dL — ABNORMAL LOW (ref 13.0–17.0)
MCH: 22.9 pg — ABNORMAL LOW (ref 26.0–34.0)
MCHC: 31.2 g/dL (ref 30.0–36.0)
MCV: 73.2 fL — ABNORMAL LOW (ref 78.0–100.0)
Platelets: 233 10*3/uL (ref 150–400)
RBC: 5.42 MIL/uL (ref 4.22–5.81)
RDW: 15.9 % — ABNORMAL HIGH (ref 11.5–15.5)
WBC: 5.9 10*3/uL (ref 4.0–10.5)

## 2017-07-21 LAB — GLUCOSE, CAPILLARY: Glucose-Capillary: 94 mg/dL (ref 65–99)

## 2017-07-21 MED ORDER — TRANEXAMIC ACID 1000 MG/10ML IV SOLN
1000.0000 mg | Freq: Once | INTRAVENOUS | Status: AC
  Administered 2017-07-22: 1000 mg via INTRAVENOUS
  Filled 2017-07-21: qty 1100

## 2017-07-21 NOTE — Pre-Procedure Instructions (Signed)
Cardiac clearance from Jory Sims, NP 07/21/2017 in epic.

## 2017-07-21 NOTE — Telephone Encounter (Signed)
Yes, OK to postpone 4-6 weeks, please schedule, thanks   [name] MD

## 2017-07-21 NOTE — Pre-Procedure Instructions (Signed)
CBC, BMP, PCR results 07/21/2017 faxed to Dr. Lyla Glassing via epic.

## 2017-07-21 NOTE — Patient Instructions (Signed)
Medication Instructions:  NO CHANGES- Your physician recommends that you continue on your current medications as directed. Please refer to the Current Medication list given to you today.  If you need a refill on your cardiac medications before your next appointment, please call your pharmacy.  Special Instructions: CLEARED FOR HIP SURGERY-WE WILL CONTACT SURGEON  Follow-Up: Your physician wants you to follow-up in: 2 MONTHS WITH DR Putnam ([place]), DNP,AACC IF PRIMARY CARDIOLOGIST IS UNAVAILABLE.   Thank you for choosing [facility] at [facility]!!

## 2017-07-22 ENCOUNTER — Inpatient Hospital Stay (HOSPITAL_COMMUNITY): Payer: 59

## 2017-07-22 ENCOUNTER — Encounter: Payer: Self-pay | Admitting: Cardiothoracic Surgery

## 2017-07-22 ENCOUNTER — Encounter (HOSPITAL_COMMUNITY): Payer: Self-pay | Admitting: Certified Registered Nurse Anesthetist

## 2017-07-22 ENCOUNTER — Inpatient Hospital Stay (HOSPITAL_COMMUNITY): Payer: 59 | Admitting: Certified Registered Nurse Anesthetist

## 2017-07-22 ENCOUNTER — Other Ambulatory Visit: Payer: Self-pay

## 2017-07-22 ENCOUNTER — Encounter (HOSPITAL_COMMUNITY)
Admission: RE | Disposition: A | Discharge status: Home Health | Payer: Self-pay | Source: Ambulatory Visit | Attending: Orthopedic Surgery

## 2017-07-22 ENCOUNTER — Inpatient Hospital Stay (HOSPITAL_COMMUNITY)
Admission: RE | Admit: 2017-07-22 | Discharge: 2017-07-24 | DRG: 470 | Disposition: A | Discharge status: Home Health | Payer: 59 | Source: Ambulatory Visit | Attending: Orthopedic Surgery | Admitting: Orthopedic Surgery

## 2017-07-22 DIAGNOSIS — K08409 Partial loss of teeth, unspecified cause, unspecified class: Secondary | ICD-10-CM | POA: Diagnosis present

## 2017-07-22 DIAGNOSIS — K219 Gastro-esophageal reflux disease without esophagitis: Secondary | ICD-10-CM | POA: Diagnosis present

## 2017-07-22 DIAGNOSIS — Z823 Family history of stroke: Secondary | ICD-10-CM

## 2017-07-22 DIAGNOSIS — Z87891 Personal history of nicotine dependence: Secondary | ICD-10-CM | POA: Diagnosis not present

## 2017-07-22 DIAGNOSIS — E782 Mixed hyperlipidemia: Secondary | ICD-10-CM | POA: Diagnosis present

## 2017-07-22 DIAGNOSIS — M1612 Unilateral primary osteoarthritis, left hip: Principal | ICD-10-CM | POA: Diagnosis present

## 2017-07-22 DIAGNOSIS — I447 Left bundle-branch block, unspecified: Secondary | ICD-10-CM | POA: Diagnosis present

## 2017-07-22 DIAGNOSIS — M25752 Osteophyte, left hip: Secondary | ICD-10-CM | POA: Diagnosis present

## 2017-07-22 DIAGNOSIS — Z8719 Personal history of other diseases of the digestive system: Secondary | ICD-10-CM

## 2017-07-22 DIAGNOSIS — Z419 Encounter for procedure for purposes other than remedying health state, unspecified: Secondary | ICD-10-CM

## 2017-07-22 DIAGNOSIS — Z96642 Presence of left artificial hip joint: Secondary | ICD-10-CM | POA: Diagnosis not present

## 2017-07-22 DIAGNOSIS — Z7984 Long term (current) use of oral hypoglycemic drugs: Secondary | ICD-10-CM | POA: Diagnosis not present

## 2017-07-22 DIAGNOSIS — Z7982 Long term (current) use of aspirin: Secondary | ICD-10-CM

## 2017-07-22 DIAGNOSIS — Z9181 History of falling: Secondary | ICD-10-CM

## 2017-07-22 DIAGNOSIS — Z8249 Family history of ischemic heart disease and other diseases of the circulatory system: Secondary | ICD-10-CM

## 2017-07-22 DIAGNOSIS — Z79899 Other long term (current) drug therapy: Secondary | ICD-10-CM | POA: Diagnosis not present

## 2017-07-22 DIAGNOSIS — D509 Iron deficiency anemia, unspecified: Secondary | ICD-10-CM | POA: Diagnosis present

## 2017-07-22 DIAGNOSIS — G609 Hereditary and idiopathic neuropathy, unspecified: Secondary | ICD-10-CM | POA: Diagnosis present

## 2017-07-22 DIAGNOSIS — Z833 Family history of diabetes mellitus: Secondary | ICD-10-CM

## 2017-07-22 DIAGNOSIS — Z471 Aftercare following joint replacement surgery: Secondary | ICD-10-CM | POA: Diagnosis not present

## 2017-07-22 DIAGNOSIS — Z8501 Personal history of malignant neoplasm of esophagus: Secondary | ICD-10-CM

## 2017-07-22 DIAGNOSIS — E119 Type 2 diabetes mellitus without complications: Secondary | ICD-10-CM | POA: Diagnosis present

## 2017-07-22 DIAGNOSIS — Z800 Family history of malignant neoplasm of digestive organs: Secondary | ICD-10-CM

## 2017-07-22 DIAGNOSIS — H9191 Unspecified hearing loss, right ear: Secondary | ICD-10-CM | POA: Diagnosis present

## 2017-07-22 DIAGNOSIS — M5135 Other intervertebral disc degeneration, thoracolumbar region: Secondary | ICD-10-CM | POA: Diagnosis present

## 2017-07-22 DIAGNOSIS — Z09 Encounter for follow-up examination after completed treatment for conditions other than malignant neoplasm: Secondary | ICD-10-CM

## 2017-07-22 HISTORY — PX: TOTAL HIP ARTHROPLASTY: SHX124

## 2017-07-22 LAB — TYPE AND SCREEN
ABO/RH(D): O POS
Antibody Screen: NEGATIVE

## 2017-07-22 LAB — GLUCOSE, CAPILLARY
Glucose-Capillary: 87 mg/dL (ref 65–99)
Glucose-Capillary: 143 mg/dL — ABNORMAL HIGH (ref 65–99)
Glucose-Capillary: 154 mg/dL — ABNORMAL HIGH (ref 65–99)
Glucose-Capillary: 149 mg/dL — ABNORMAL HIGH (ref 65–99)

## 2017-07-22 LAB — HEMOGLOBIN A1C
Hgb A1c MFr Bld: 6.2 % — ABNORMAL HIGH (ref 4.8–5.6)
Mean Plasma Glucose: 131.24 mg/dL

## 2017-07-22 SURGERY — ARTHROPLASTY, HIP, TOTAL, ANTERIOR APPROACH
Anesthesia: Spinal | Site: Hip | Laterality: Left

## 2017-07-22 MED ORDER — ONDANSETRON HCL 4 MG PO TABS: 4.0000 mg | ORAL_TABLET | Freq: Four times a day (QID) | ORAL | Status: DC | PRN

## 2017-07-22 MED ORDER — ESMOLOL HCL 100 MG/10ML IV SOLN
INTRAVENOUS | Status: DC | PRN
  Administered 2017-07-22: 30 mg via INTRAVENOUS

## 2017-07-22 MED ORDER — ISOPROPYL ALCOHOL 70 % SOLN
Status: DC | PRN
  Administered 2017-07-22: 1 via TOPICAL

## 2017-07-22 MED ORDER — ACETAMINOPHEN 325 MG PO TABS
325.0000 mg | ORAL_TABLET | Freq: Four times a day (QID) | ORAL | Status: DC | PRN
  Administered 2017-07-24: 650 mg via ORAL
  Filled 2017-07-22: qty 2

## 2017-07-22 MED ORDER — CEFAZOLIN SODIUM-DEXTROSE 2-4 GM/100ML-% IV SOLN
2.0000 g | Freq: Four times a day (QID) | INTRAVENOUS | Status: AC
  Administered 2017-07-22 – 2017-07-23 (×2): 2 g via INTRAVENOUS
  Filled 2017-07-22 (×2): qty 100

## 2017-07-22 MED ORDER — FENTANYL CITRATE (PF) 100 MCG/2ML IJ SOLN
INTRAMUSCULAR | Status: AC
  Filled 2017-07-22: qty 2

## 2017-07-22 MED ORDER — METHOCARBAMOL 1000 MG/10ML IJ SOLN
500.0000 mg | Freq: Four times a day (QID) | INTRAVENOUS | Status: DC | PRN
  Administered 2017-07-22: 500 mg via INTRAVENOUS
  Filled 2017-07-22: qty 550

## 2017-07-22 MED ORDER — SODIUM CHLORIDE 0.9 % IJ SOLN
INTRAMUSCULAR | Status: DC | PRN
  Administered 2017-07-22: 29 mL

## 2017-07-22 MED ORDER — FENTANYL CITRATE (PF) 100 MCG/2ML IJ SOLN
25.0000 ug | INTRAMUSCULAR | Status: DC | PRN
  Administered 2017-07-22 (×2): 50 ug via INTRAVENOUS

## 2017-07-22 MED ORDER — AMITRIPTYLINE HCL 10 MG PO TABS
10.0000 mg | ORAL_TABLET | Freq: Every day | ORAL | Status: DC
  Administered 2017-07-22 – 2017-07-23 (×2): 10 mg via ORAL
  Filled 2017-07-22 (×3): qty 1

## 2017-07-22 MED ORDER — CEFAZOLIN SODIUM-DEXTROSE 2-4 GM/100ML-% IV SOLN
INTRAVENOUS | Status: AC
  Filled 2017-07-22: qty 100

## 2017-07-22 MED ORDER — PROPOFOL 10 MG/ML IV BOLUS
INTRAVENOUS | Status: AC
Start: 2017-07-22 — End: 2017-07-22
  Filled 2017-07-22: qty 20

## 2017-07-22 MED ORDER — SUGAMMADEX SODIUM 200 MG/2ML IV SOLN
INTRAVENOUS | Status: AC
  Filled 2017-07-22: qty 2

## 2017-07-22 MED ORDER — MIDAZOLAM HCL 5 MG/5ML IJ SOLN
INTRAMUSCULAR | Status: DC | PRN
  Administered 2017-07-22: 1 mg via INTRAVENOUS

## 2017-07-22 MED ORDER — SODIUM CHLORIDE 0.9 % IR SOLN
Status: DC | PRN
  Administered 2017-07-22: 4000 mL

## 2017-07-22 MED ORDER — SODIUM CHLORIDE 0.9 % IV SOLN: INTRAVENOUS | Status: DC

## 2017-07-22 MED ORDER — HYDROMORPHONE HCL 1 MG/ML IJ SOLN: 0.5000 mg | INTRAMUSCULAR | Status: DC | PRN

## 2017-07-22 MED ORDER — OXYCODONE HCL 5 MG PO TABS
10.0000 mg | ORAL_TABLET | ORAL | Status: DC | PRN
  Administered 2017-07-22 – 2017-07-24 (×6): 10 mg via ORAL
  Filled 2017-07-22 (×6): qty 2

## 2017-07-22 MED ORDER — KETOROLAC TROMETHAMINE 30 MG/ML IJ SOLN
INTRAMUSCULAR | Status: AC
  Filled 2017-07-22: qty 1

## 2017-07-22 MED ORDER — ALFUZOSIN HCL ER 10 MG PO TB24
10.0000 mg | ORAL_TABLET | Freq: Every day | ORAL | Status: DC
  Administered 2017-07-23 – 2017-07-24 (×2): 10 mg via ORAL
  Filled 2017-07-22 (×2): qty 1

## 2017-07-22 MED ORDER — PROPOFOL 10 MG/ML IV BOLUS
INTRAVENOUS | Status: DC | PRN
  Administered 2017-07-22: 200 mg via INTRAVENOUS

## 2017-07-22 MED ORDER — DOCUSATE SODIUM 100 MG PO CAPS
100.0000 mg | ORAL_CAPSULE | Freq: Two times a day (BID) | ORAL | Status: DC
  Administered 2017-07-22 – 2017-07-24 (×4): 100 mg via ORAL
  Filled 2017-07-22 (×4): qty 1

## 2017-07-22 MED ORDER — ONDANSETRON HCL 4 MG/2ML IJ SOLN
INTRAMUSCULAR | Status: AC
  Filled 2017-07-22: qty 2

## 2017-07-22 MED ORDER — BUDESONIDE 0.25 MG/2ML IN SUSP
0.2500 mg | Freq: Two times a day (BID) | RESPIRATORY_TRACT | Status: DC
  Administered 2017-07-23 – 2017-07-24 (×2): 0.25 mg via RESPIRATORY_TRACT
  Filled 2017-07-22 (×4): qty 2

## 2017-07-22 MED ORDER — LIDOCAINE 2% (20 MG/ML) 5 ML SYRINGE
INTRAMUSCULAR | Status: DC | PRN
  Administered 2017-07-22: 60 mg via INTRAVENOUS

## 2017-07-22 MED ORDER — METOCLOPRAMIDE HCL 5 MG PO TABS: 5.0000 mg | ORAL_TABLET | Freq: Three times a day (TID) | ORAL | Status: DC | PRN

## 2017-07-22 MED ORDER — METHOCARBAMOL 500 MG PO TABS
500.0000 mg | ORAL_TABLET | Freq: Four times a day (QID) | ORAL | Status: DC | PRN
  Administered 2017-07-24 (×2): 500 mg via ORAL
  Filled 2017-07-22 (×2): qty 1

## 2017-07-22 MED ORDER — CHLORHEXIDINE GLUCONATE 4 % EX LIQD: 60.0000 mL | Freq: Once | CUTANEOUS | Status: DC

## 2017-07-22 MED ORDER — KETOROLAC TROMETHAMINE 30 MG/ML IJ SOLN
INTRAMUSCULAR | Status: DC | PRN
  Administered 2017-07-22: 30 mg

## 2017-07-22 MED ORDER — MIDAZOLAM HCL 2 MG/2ML IJ SOLN
INTRAMUSCULAR | Status: AC
  Filled 2017-07-22: qty 2

## 2017-07-22 MED ORDER — CEFAZOLIN SODIUM-DEXTROSE 2-4 GM/100ML-% IV SOLN
2.0000 g | INTRAVENOUS | Status: AC
  Administered 2017-07-22: 2 g via INTRAVENOUS
  Filled 2017-07-22: qty 100

## 2017-07-22 MED ORDER — PANTOPRAZOLE SODIUM 40 MG PO TBEC
40.0000 mg | DELAYED_RELEASE_TABLET | Freq: Every day | ORAL | Status: DC
  Administered 2017-07-22 – 2017-07-24 (×3): 40 mg via ORAL
  Filled 2017-07-22 (×3): qty 1

## 2017-07-22 MED ORDER — DEXAMETHASONE SODIUM PHOSPHATE 10 MG/ML IJ SOLN
10.0000 mg | Freq: Once | INTRAMUSCULAR | Status: AC
  Administered 2017-07-23: 10 mg via INTRAVENOUS
  Filled 2017-07-22: qty 1

## 2017-07-22 MED ORDER — ROCURONIUM BROMIDE 10 MG/ML (PF) SYRINGE
PREFILLED_SYRINGE | INTRAVENOUS | Status: AC
  Filled 2017-07-22: qty 5

## 2017-07-22 MED ORDER — MENTHOL 3 MG MT LOZG: 1.0000 | LOZENGE | OROMUCOSAL | Status: DC | PRN

## 2017-07-22 MED ORDER — PROPOFOL 10 MG/ML IV BOLUS
INTRAVENOUS | Status: AC
  Filled 2017-07-22: qty 60

## 2017-07-22 MED ORDER — SODIUM CHLORIDE 0.9 % IJ SOLN
INTRAMUSCULAR | Status: AC
  Filled 2017-07-22: qty 50

## 2017-07-22 MED ORDER — ONDANSETRON HCL 4 MG/2ML IJ SOLN: 4.0000 mg | Freq: Four times a day (QID) | INTRAMUSCULAR | Status: DC | PRN

## 2017-07-22 MED ORDER — 0.9 % SODIUM CHLORIDE (POUR BTL) OPTIME
TOPICAL | Status: DC | PRN
  Administered 2017-07-22: 1000 mL

## 2017-07-22 MED ORDER — PHENOL 1.4 % MT LIQD: 1.0000 | OROMUCOSAL | Status: DC | PRN

## 2017-07-22 MED ORDER — INSULIN ASPART 100 UNIT/ML ~~LOC~~ SOLN
0.0000 [IU] | Freq: Three times a day (TID) | SUBCUTANEOUS | Status: DC
  Administered 2017-07-24: 1 [IU] via SUBCUTANEOUS

## 2017-07-22 MED ORDER — FENTANYL CITRATE (PF) 100 MCG/2ML IJ SOLN
INTRAMUSCULAR | Status: DC | PRN
  Administered 2017-07-22 (×4): 50 ug via INTRAVENOUS
  Administered 2017-07-22: 100 ug via INTRAVENOUS

## 2017-07-22 MED ORDER — WATER FOR IRRIGATION, STERILE IR SOLN
Status: DC | PRN
  Administered 2017-07-22: 2000 mL via SURGICAL_CAVITY

## 2017-07-22 MED ORDER — ATORVASTATIN CALCIUM 20 MG PO TABS
20.0000 mg | ORAL_TABLET | Freq: Every day | ORAL | Status: DC
  Administered 2017-07-22 – 2017-07-23 (×2): 20 mg via ORAL
  Filled 2017-07-22 (×2): qty 1

## 2017-07-22 MED ORDER — BUPIVACAINE HCL (PF) 0.25 % IJ SOLN
INTRAMUSCULAR | Status: AC
  Filled 2017-07-22: qty 30

## 2017-07-22 MED ORDER — SODIUM CHLORIDE 0.9 % IV SOLN
INTRAVENOUS | Status: DC
  Administered 2017-07-22 – 2017-07-23 (×2): via INTRAVENOUS

## 2017-07-22 MED ORDER — ONDANSETRON HCL 4 MG/2ML IJ SOLN
INTRAMUSCULAR | Status: DC | PRN
  Administered 2017-07-22: 4 mg via INTRAVENOUS

## 2017-07-22 MED ORDER — GABAPENTIN 300 MG PO CAPS
300.0000 mg | ORAL_CAPSULE | Freq: Two times a day (BID) | ORAL | Status: DC
  Administered 2017-07-22 – 2017-07-24 (×4): 300 mg via ORAL
  Filled 2017-07-22 (×4): qty 1

## 2017-07-22 MED ORDER — METOCLOPRAMIDE HCL 5 MG/ML IJ SOLN: 5.0000 mg | Freq: Three times a day (TID) | INTRAMUSCULAR | Status: DC | PRN

## 2017-07-22 MED ORDER — POLYETHYLENE GLYCOL 3350 17 G PO PACK: 17.0000 g | PACK | Freq: Every day | ORAL | Status: DC | PRN

## 2017-07-22 MED ORDER — APIXABAN 2.5 MG PO TABS
2.5000 mg | ORAL_TABLET | Freq: Two times a day (BID) | ORAL | Status: DC
  Administered 2017-07-23 – 2017-07-24 (×3): 2.5 mg via ORAL
  Filled 2017-07-22 (×3): qty 1

## 2017-07-22 MED ORDER — ROCURONIUM BROMIDE 10 MG/ML (PF) SYRINGE
PREFILLED_SYRINGE | INTRAVENOUS | Status: DC | PRN
  Administered 2017-07-22: 20 mg via INTRAVENOUS
  Administered 2017-07-22: 40 mg via INTRAVENOUS

## 2017-07-22 MED ORDER — DEXAMETHASONE SODIUM PHOSPHATE 10 MG/ML IJ SOLN
INTRAMUSCULAR | Status: DC | PRN
  Administered 2017-07-22: 10 mg via INTRAVENOUS

## 2017-07-22 MED ORDER — ACETAMINOPHEN 10 MG/ML IV SOLN
1000.0000 mg | INTRAVENOUS | Status: AC
  Administered 2017-07-22: 1000 mg via INTRAVENOUS
  Filled 2017-07-22: qty 100

## 2017-07-22 MED ORDER — LIDOCAINE 2% (20 MG/ML) 5 ML SYRINGE
INTRAMUSCULAR | Status: AC
  Filled 2017-07-22: qty 5

## 2017-07-22 MED ORDER — LACTATED RINGERS IV SOLN
INTRAVENOUS | Status: DC
  Administered 2017-07-22 (×2): via INTRAVENOUS

## 2017-07-22 MED ORDER — DEXAMETHASONE SODIUM PHOSPHATE 10 MG/ML IJ SOLN
INTRAMUSCULAR | Status: AC
  Filled 2017-07-22: qty 1

## 2017-07-22 MED ORDER — GLIMEPIRIDE 1 MG PO TABS
1.0000 mg | ORAL_TABLET | Freq: Every day | ORAL | Status: DC
  Administered 2017-07-23 – 2017-07-24 (×2): 1 mg via ORAL
  Filled 2017-07-22 (×2): qty 1

## 2017-07-22 MED ORDER — POVIDONE-IODINE 10 % EX SWAB: 2.0000 "application " | Freq: Once | CUTANEOUS | Status: DC

## 2017-07-22 MED ORDER — ONDANSETRON HCL 4 MG/2ML IJ SOLN: 4.0000 mg | Freq: Once | INTRAMUSCULAR | Status: DC | PRN

## 2017-07-22 MED ORDER — KETOROLAC TROMETHAMINE 15 MG/ML IJ SOLN
7.5000 mg | Freq: Four times a day (QID) | INTRAMUSCULAR | Status: AC
  Administered 2017-07-22 – 2017-07-23 (×4): 7.5 mg via INTRAVENOUS
  Filled 2017-07-22 (×4): qty 1

## 2017-07-22 MED ORDER — DIPHENHYDRAMINE HCL 12.5 MG/5ML PO ELIX: 12.5000 mg | ORAL_SOLUTION | ORAL | Status: DC | PRN

## 2017-07-22 MED ORDER — SUGAMMADEX SODIUM 200 MG/2ML IV SOLN
INTRAVENOUS | Status: DC | PRN
  Administered 2017-07-22: 200 mg via INTRAVENOUS

## 2017-07-22 MED ORDER — ALUM & MAG HYDROXIDE-SIMETH 200-200-20 MG/5ML PO SUSP: 30.0000 mL | ORAL | Status: DC | PRN

## 2017-07-22 MED ORDER — BUPIVACAINE HCL (PF) 0.25 % IJ SOLN
INTRAMUSCULAR | Status: DC | PRN
  Administered 2017-07-22: 30 mL

## 2017-07-22 SURGICAL SUPPLY — 53 items
ADH SKN CLS APL DERMABOND .7 (GAUZE/BANDAGES/DRESSINGS) ×1
BAG SPEC THK2 15X12 ZIP CLS (MISCELLANEOUS) ×1
BAG ZIPLOCK 12X15 (MISCELLANEOUS) ×2 IMPLANT
CAPT HIP TOTAL 2 ×1 IMPLANT
CHLORAPREP W/TINT 26ML (MISCELLANEOUS) ×2 IMPLANT
CLOTH BEACON ORANGE TIMEOUT ST (SAFETY) ×2 IMPLANT
COVER PERINEAL POST (MISCELLANEOUS) ×2 IMPLANT
COVER SURGICAL LIGHT HANDLE (MISCELLANEOUS) ×2 IMPLANT
DECANTER SPIKE VIAL GLASS SM (MISCELLANEOUS) ×2 IMPLANT
DERMABOND ADVANCED (GAUZE/BANDAGES/DRESSINGS) ×1
DERMABOND ADVANCED .7 DNX12 (GAUZE/BANDAGES/DRESSINGS) ×2 IMPLANT
DRAPE SHEET LG 3/4 BI-LAMINATE (DRAPES) ×6 IMPLANT
DRAPE STERI IOBAN 125X83 (DRAPES) ×2 IMPLANT
DRAPE U-SHAPE 47X51 STRL (DRAPES) ×4 IMPLANT
DRESSING AQUACEL AG SP 3.5X10 (GAUZE/BANDAGES/DRESSINGS) IMPLANT
DRSG AQUACEL AG SP 3.5X10 (GAUZE/BANDAGES/DRESSINGS) ×2
ELECT PENCIL ROCKER SW 15FT (MISCELLANEOUS) ×2 IMPLANT
ELECT REM PT RETURN 15FT ADLT (MISCELLANEOUS) ×2 IMPLANT
GAUZE SPONGE 4X4 12PLY STRL (GAUZE/BANDAGES/DRESSINGS) ×2 IMPLANT
GLOVE BIO SURGEON STRL SZ 6.5 (GLOVE) ×1 IMPLANT
GLOVE BIO SURGEON STRL SZ8.5 (GLOVE) ×5 IMPLANT
GLOVE BIOGEL PI IND STRL 6.5 (GLOVE) IMPLANT
GLOVE BIOGEL PI IND STRL 7.0 (GLOVE) IMPLANT
GLOVE BIOGEL PI IND STRL 7.5 (GLOVE) IMPLANT
GLOVE BIOGEL PI IND STRL 8.5 (GLOVE) ×1 IMPLANT
GLOVE BIOGEL PI INDICATOR 6.5 (GLOVE) ×1
GLOVE BIOGEL PI INDICATOR 7.0 (GLOVE) ×1
GLOVE BIOGEL PI INDICATOR 7.5 (GLOVE) ×4
GLOVE BIOGEL PI INDICATOR 8.5 (GLOVE) ×1
GLOVE SURG SS PI 7.0 STRL IVOR (GLOVE) ×2 IMPLANT
GOWN SPEC L3 XXLG W/TWL (GOWN DISPOSABLE) ×2 IMPLANT
GOWN STRL REUS W/TWL LRG LVL3 (GOWN DISPOSABLE) ×1 IMPLANT
GOWN STRL REUS W/TWL XL LVL3 (GOWN DISPOSABLE) ×2 IMPLANT
HANDPIECE INTERPULSE COAX TIP (DISPOSABLE) ×2
HOLDER FOLEY CATH W/STRAP (MISCELLANEOUS) ×2 IMPLANT
HOOD PEEL AWAY FLYTE STAYCOOL (MISCELLANEOUS) ×8 IMPLANT
MARKER SKIN DUAL TIP RULER LAB (MISCELLANEOUS) ×2 IMPLANT
NDL SPNL 18GX3.5 QUINCKE PK (NEEDLE) ×1 IMPLANT
NEEDLE SPNL 18GX3.5 QUINCKE PK (NEEDLE) ×2 IMPLANT
PACK ANTERIOR HIP CUSTOM (KITS) ×2 IMPLANT
SAW OSC TIP CART 19.5X105X1.3 (SAW) ×2 IMPLANT
SEALER BIPOLAR AQUA 6.0 (INSTRUMENTS) ×2 IMPLANT
SET HNDPC FAN SPRY TIP SCT (DISPOSABLE) ×1 IMPLANT
SUT ETHIBOND NAB CT1 #1 30IN (SUTURE) ×4 IMPLANT
SUT MNCRL AB 3-0 PS2 18 (SUTURE) ×2 IMPLANT
SUT MON AB 2-0 CT1 36 (SUTURE) ×4 IMPLANT
SUT STRATAFIX PDO 1 14 VIOLET (SUTURE) ×2
SUT STRATFX PDO 1 14 VIOLET (SUTURE) ×1
SUT VIC AB 2-0 CT1 27 (SUTURE) ×2
SUT VIC AB 2-0 CT1 TAPERPNT 27 (SUTURE) ×1 IMPLANT
SUTURE STRATFX PDO 1 14 VIOLET (SUTURE) ×1 IMPLANT
SYR 50ML LL SCALE MARK (SYRINGE) ×2 IMPLANT
YANKAUER SUCT BULB TIP 10FT TU (MISCELLANEOUS) ×2 IMPLANT

## 2017-07-22 NOTE — Anesthesia Postprocedure Evaluation (Signed)
Anesthesia Post Note  Patient: Juan [name]  Procedure(s) Performed: LEFT TOTAL HIP ARTHROPLASTY ANTERIOR APPROACH (Left Hip)     Patient location during evaluation: PACU Anesthesia Type: General Level of consciousness: awake Pain management: pain level controlled Vital Signs Assessment: post-procedure vital signs reviewed and stable Respiratory status: spontaneous breathing, nonlabored ventilation, respiratory function stable and patient connected to nasal cannula oxygen Cardiovascular status: blood pressure returned to baseline and stable Postop Assessment: no apparent nausea or vomiting Anesthetic complications: no    Last Vitals:  Vitals:   07/22/17 1820 07/22/17 1930  BP: 130/85 116/80  Pulse: 78 (!) 101  Resp:    Temp:    SpO2: 96% 98%    Last Pain:  Vitals:   07/22/17 1913  TempSrc:   PainSc: 5                  Kristy Catoe P Charlotte Brafford

## 2017-07-22 NOTE — Discharge Instructions (Addendum)
Dr. Rod Can Joint Replacement Specialist [facility] [address] [place], [place] [address] [phone]  _________________________________________________________  Information on my medicine - ELIQUIS (apixaban)  This medication education was reviewed with me or my healthcare representative as part of my discharge preparation.    Why was Eliquis prescribed for you? Eliquis was prescribed for you to reduce the risk of blood clots forming after orthopedic surgery.    What do You need to know about Eliquis? Take your Eliquis TWICE DAILY - one tablet in the morning and one tablet in the evening with or without food.  It would be best to take the dose about the same time each day.  If you have difficulty swallowing the tablet whole please discuss with your pharmacist how to take the medication safely.  Take Eliquis exactly as prescribed by your doctor and DO NOT stop taking Eliquis without talking to the doctor who prescribed the medication.  Stopping without other medication to take the place of Eliquis may increase your risk of developing a clot.  After discharge, you should have regular check-up appointments with your healthcare provider that is prescribing your Eliquis.  What do you do if you miss a dose? If a dose of ELIQUIS is not taken at the scheduled time, take it as soon as possible on the same day and twice-daily administration should be resumed.  The dose should not be doubled to make up for a missed dose.  Do not take more than one tablet of ELIQUIS at the same time.  Important Safety Information A possible side effect of Eliquis is bleeding. You should call your healthcare provider right away if you experience any of the following: ? Bleeding from an injury or your nose that does not stop. ? Unusual colored urine (red or dark brown) or unusual colored stools (red or black). ? Unusual bruising for unknown reasons. ? A serious  fall or if you hit your head (even if there is no bleeding).  Some medicines may interact with Eliquis and might increase your risk of bleeding or clotting while on Eliquis. To help avoid this, consult your healthcare provider or pharmacist prior to using any new prescription or non-prescription medications, including herbals, vitamins, non-steroidal anti-inflammatory drugs (NSAIDs) and supplements.  This website has more information on Eliquis (apixaban): http://www.eliquis.com/eliquis/home  TOTAL HIP REPLACEMENT POSTOPERATIVE DIRECTIONS    Hip Rehabilitation, Guidelines Following Surgery   WEIGHT BEARING Weight bearing as tolerated with assist device (walker, cane, etc) as directed, use it as long as suggested by your surgeon or therapist, typically at least 4-6 weeks.  The results of a hip operation are greatly improved after range of motion and muscle strengthening exercises. Follow all safety measures which are given to protect your hip. If any of these exercises cause increased pain or swelling in your joint, decrease the amount until you are comfortable again. Then slowly increase the exercises. Call your caregiver if you have problems or questions.   HOME CARE INSTRUCTIONS  Most of the following instructions are designed to prevent the dislocation of your new hip.  Remove items at home which could result in a fall. This includes throw rugs or furniture in walking pathways.  Continue medications as instructed at time of discharge.  You may have some home medications which will be placed on hold until you complete the course of blood thinner medication.  You may start showering once you are discharged home. Do not remove your dressing. Do not put  on socks or shoes without following the instructions of your caregivers.   Sit on chairs with arms. Use the chair arms to help push yourself up when arising.  Arrange for the use of a toilet seat elevator so you are not sitting low.    Walk with walker as instructed.  You may resume a sexual relationship in one month or when given the OK by your caregiver.  Use walker as long as suggested by your caregivers.  You may put full weight on your legs and walk as much as is comfortable. Avoid periods of inactivity such as sitting longer than an hour when not asleep. This helps prevent blood clots.  You may return to work once you are cleared by Engineer, production.  Do not drive a car for 6 weeks or until released by your surgeon.  Do not drive while taking narcotics.  Wear elastic stockings for two weeks following surgery during the day but you may remove then at night.  Make sure you keep all of your appointments after your operation with all of your doctors and caregivers. You should call the office at the above phone number and make an appointment for approximately two weeks after the date of your surgery. Please pick up a stool softener and laxative for home use as long as you are requiring pain medications.  ICE to the affected hip every three hours for 30 minutes at a time and then as needed for pain and swelling. Continue to use ice on the hip for pain and swelling from surgery. You may notice swelling that will progress down to the foot and ankle.  This is normal after surgery.  Elevate the leg when you are not up walking on it.   It is important for you to complete the blood thinner medication as prescribed by your doctor.  Continue to use the breathing machine which will help keep your temperature down.  It is common for your temperature to cycle up and down following surgery, especially at night when you are not up moving around and exerting yourself.  The breathing machine keeps your lungs expanded and your temperature down.  RANGE OF MOTION AND STRENGTHENING EXERCISES  These exercises are designed to help you keep full movement of your hip joint. Follow your caregiver's or physical therapist's instructions. Perform all  exercises about fifteen times, three times per day or as directed. Exercise both hips, even if you have had only one joint replacement. These exercises can be done on a training (exercise) mat, on the floor, on a table or on a bed. Use whatever works the best and is most comfortable for you. Use music or television while you are exercising so that the exercises are a pleasant break in your day. This will make your life better with the exercises acting as a break in routine you can look forward to.  Lying on your back, slowly slide your foot toward your buttocks, raising your knee up off the floor. Then slowly slide your foot back down until your leg is straight again.  Lying on your back spread your legs as far apart as you can without causing discomfort.  Lying on your side, raise your upper leg and foot straight up from the floor as far as is comfortable. Slowly lower the leg and repeat.  Lying on your back, tighten up the muscle in the front of your thigh (quadriceps muscles). You can do this by keeping your leg straight and trying to  raise your heel off the floor. This helps strengthen the largest muscle supporting your knee.  Lying on your back, tighten up the muscles of your buttocks both with the legs straight and with the knee bent at a comfortable angle while keeping your heel on the floor.   SKILLED REHAB INSTRUCTIONS: If the patient is transferred to a skilled rehab facility following release from the hospital, a list of the current medications will be sent to the facility for the patient to continue.  When discharged from the skilled rehab facility, please have the facility set up the patient's [place] prior to being released. Also, the skilled facility will be responsible for providing the patient with their medications at time of release from the facility to include their pain medication and their blood thinner medication. If the patient is still at the rehab facility at  time of the two week follow up appointment, the skilled rehab facility will also need to assist the patient in arranging follow up appointment in our office and any transportation needs.  MAKE SURE YOU:  Understand these instructions.  Will watch your condition.  Will get help right away if you are not doing well or get worse.  Pick up stool softner and laxative for home use following surgery while on pain medications. Do not remove your dressing. The dressing is waterproof--it is OK to take showers. Continue to use ice for pain and swelling after surgery. Do not use any lotions or creams on the incision until instructed by your surgeon. Total Hip Protocol.   Call 911 if any chest pain or difficulty breathing. Call Dr Lyla Glassing [phone]) if any problems with fever over 101, unrelieved pain, calf pain in either leg, redness at incision site, or any drainage from wound that is NOT small amounts of blood or clear           fluid - fluid may be tinged yellow, orange or pink - call if drainage is any other color or more than a small amount.

## 2017-07-22 NOTE — Progress Notes (Signed)
Cardiac clearance note received via fax 07-21-17 kathryn larwence np and placed on patient chart

## 2017-07-22 NOTE — Anesthesia Procedure Notes (Signed)
Procedure Name: Intubation Date/Time: 07/22/2017 12:48 PM Performed by: Genelle Bal, CRNA Pre-anesthesia Checklist: Patient identified, Emergency Drugs available, Suction available and Patient being monitored Patient Re-evaluated:Patient Re-evaluated prior to induction Oxygen Delivery Method: Circle system utilized Preoxygenation: Pre-oxygenation with 100% oxygen Induction Type: IV induction Ventilation: Mask ventilation without difficulty Laryngoscope Size: Miller and 2 Grade View: Grade I Tube type: Oral Tube size: 7.5 mm Number of attempts: 1 Airway Equipment and Method: Stylet and Oral airway Placement Confirmation: ETT inserted through vocal cords under direct vision,  positive ETCO2 and breath sounds checked- equal and bilateral Secured at: 22 cm Tube secured with: Tape Dental Injury: Teeth and Oropharynx as per pre-operative assessment

## 2017-07-22 NOTE — Interval H&P Note (Signed)
History and Physical Interval Note:  07/22/2017 12:07 PM  Juan [name]  has presented today for surgery, with the diagnosis of Left hip degenerative joint disease  The various methods of treatment have been discussed with the patient and family. After consideration of risks, benefits and other options for treatment, the patient has consented to  Procedure(s) with comments: LEFT TOTAL HIP ARTHROPLASTY ANTERIOR APPROACH (Left) - Needs RNFA as a surgical intervention .  The patient's history has been reviewed, patient examined, no change in status, stable for surgery.  I have reviewed the patient's chart and labs.  Questions were answered to the patient's satisfaction.     Hilton Cork Veniamin Kincaid

## 2017-07-22 NOTE — Anesthesia Preprocedure Evaluation (Addendum)
Anesthesia Evaluation  Patient identified by MRN, date of birth, ID band Patient awake    Reviewed: Allergy & Precautions, NPO status , Patient's Chart, lab work & pertinent test results  Airway Mallampati: III  TM Distance: >3 FB Neck ROM: Full    Dental  (+) Edentulous Lower, Edentulous Upper   Pulmonary former smoker,    Pulmonary exam normal breath sounds clear to auscultation       Cardiovascular Normal cardiovascular exam Rhythm:Regular Rate:Normal  ECG: SB, rate 58, LAD, LBBB   Neuro/Psych  Headaches, PSYCHIATRIC DISORDERS    GI/Hepatic GERD  Medicated and Controlled,  Endo/Other  diabetes, Oral Hypoglycemic Agents  Renal/GU      Musculoskeletal  (+) Arthritis , Osteoarthritis,    Abdominal   Peds  Hematology HLD   Anesthesia Other Findings Left hip degenerative joint disease  Reproductive/Obstetrics                            Anesthesia Physical Anesthesia Plan  ASA: III  Anesthesia Plan: Spinal   Post-op Pain Management:    Induction: Intravenous  PONV Risk Score and Plan: 1 and Propofol infusion and Treatment may vary due to age or medical condition  Airway Management Planned: Natural Airway  Additional Equipment:   Intra-op Plan:   Post-operative Plan:   Informed Consent: I have reviewed the patients History and Physical, chart, labs and discussed the procedure including the risks, benefits and alternatives for the proposed anesthesia with the patient or authorized representative who has indicated his/her understanding and acceptance.   Dental advisory given  Plan Discussed with: CRNA  Anesthesia Plan Comments:        Anesthesia Quick Evaluation

## 2017-07-22 NOTE — Transfer of Care (Signed)
Immediate Anesthesia Transfer of Care Note  Patient: Juan [name]  Procedure(s) Performed: LEFT TOTAL HIP ARTHROPLASTY ANTERIOR APPROACH (Left Hip)  Patient Location: PACU  Anesthesia Type:General  Level of Consciousness: awake, alert  and oriented  Airway & Oxygen Therapy: Patient Spontanous Breathing and Patient connected to face mask oxygen  Post-op Assessment: Report given to RN and Post -op Vital signs reviewed and stable  Post vital signs: Reviewed and stable  Last Vitals:  Vitals Value Taken Time  BP    Temp    Pulse    Resp 9 07/22/2017  2:58 PM  SpO2    Vitals shown include unvalidated device data.  Last Pain:  Vitals:   07/22/17 1022  TempSrc: Oral         Complications: No apparent anesthesia complications

## 2017-07-22 NOTE — Op Note (Signed)
OPERATIVE REPORT  SURGEON: Rod Can, MD   ASSISTANT: Roberto Scales, RNFA.  PREOPERATIVE DIAGNOSIS: Left hip arthritis.   POSTOPERATIVE DIAGNOSIS: Left hip arthritis.   PROCEDURE: Left total hip arthroplasty, anterior approach.   IMPLANTS: DePuy Tri Lock stem, size 7, hi offset. DePuy Pinnacle Cup, size 56 mm. DePuy Altrx liner, size 36 by 56 mm, neutral. DePuy Biolox ceramic head ball, size 36 + 1.5 mm.  ANESTHESIA:  General  ESTIMATED BLOOD LOSS:-250 mL    ANTIBIOTICS: 2 g Ancef.  DRAINS: None.  COMPLICATIONS: None.   CONDITION: PACU - hemodynamically stable.   BRIEF CLINICAL NOTE: Juan [name] is a 77 y.o. male with a long-standing history of Left hip arthritis. After failing conservative management, the patient was indicated for total hip arthroplasty. The risks, benefits, and alternatives to the procedure were explained, and the patient elected to proceed.  PROCEDURE IN DETAIL: Surgical site was marked by myself in the pre-op holding area. Once inside the operating room, general anesthesia was obtained, and a foley catheter was inserted. The patient was then positioned on the Hana table. All bony prominences were well padded. The hip was prepped and draped in the normal sterile surgical fashion. A time-out was called verifying side and site of surgery. The patient received IV antibiotics within 60 minutes of beginning the procedure.  The direct anterior approach to the hip was performed through the Hueter interval. Lateral femoral circumflex vessels were treated with the Auqumantys. The anterior capsule was exposed and an inverted T capsulotomy was made.The femoral neck cut was made to the level of the templated cut. A corkscrew was placed into the head and the head was removed. The femoral head was found to have eburnated bone. The head was passed to the back table and was measured.  Acetabular exposure was achieved, and the pulvinar and labrum were  excised. Sequential reaming of the acetabulum was then performed up to a size 55 mm reamer. A 56 mm cup was then opened and impacted into place at approximately 40 degrees of abduction and 20 degrees of anteversion. The final polyethylene liner was impacted into place and acetabular osteophytes were removed.   I then gained femoral exposure taking care to protect the abductors and greater trochanter. This was performed using standard external rotation, extension, and adduction. The capsule was peeled off the inner aspect of the greater trochanter, taking care to preserve the short external rotators. A cookie cutter was used to enter the femoral canal, and then the femoral canal finder was placed. Sequential broaching was performed up to a size 7. Calcar planer was used on the femoral neck remnant. I placed a hi offset neck and a trial head ball. The hip was reduced. Leg lengths and offset were checked fluoroscopically. The hip was dislocated and trial components were removed. The final implants were placed, and the hip was reduced.  Fluoroscopy was used to confirm component position and leg lengths. At 90 degrees of external rotation and full extension, the hip was stable to an anterior directed force.  The wound was copiously irrigated with normal saline using pulse lavage. Marcaine solution was injected into the periarticular soft tissue. The wound was closed in layers using #1 Vicryl and V-Loc for the fascia, 2-0 Vicryl for the subcutaneous fat, 2-0 Monocryl for the deep dermal layer, 3-0 running Monocryl subcuticular stitch, and Dermabond for the skin. Once the glue was fully dried, an Aquacell Ag dressing was applied. The patient was transported to the recovery room in  stable condition. Sponge, needle, and instrument counts were correct at the end of the case x2. The patient tolerated the procedure well and there were no known complications.

## 2017-07-23 LAB — CBC
HCT: 30.5 % — ABNORMAL LOW (ref 39.0–52.0)
Hemoglobin: 9.5 g/dL — ABNORMAL LOW (ref 13.0–17.0)
MCH: 22.6 pg — ABNORMAL LOW (ref 26.0–34.0)
MCHC: 31.1 g/dL (ref 30.0–36.0)
MCV: 72.4 fL — ABNORMAL LOW (ref 78.0–100.0)
Platelets: 201 10*3/uL (ref 150–400)
RBC: 4.21 MIL/uL — ABNORMAL LOW (ref 4.22–5.81)
RDW: 15.8 % — ABNORMAL HIGH (ref 11.5–15.5)
WBC: 7.8 10*3/uL (ref 4.0–10.5)

## 2017-07-23 LAB — BASIC METABOLIC PANEL
Anion gap: 10 (ref 5–15)
BUN: 12 mg/dL (ref 6–20)
CO2: 21 mmol/L — ABNORMAL LOW (ref 22–32)
Calcium: 8.2 mg/dL — ABNORMAL LOW (ref 8.9–10.3)
Chloride: 106 mmol/L (ref 101–111)
Creatinine, Ser: 0.86 mg/dL (ref 0.61–1.24)
GFR calc Af Amer: >60 mL/min (ref >60)
GFR calc non Af Amer: >60 mL/min (ref >60)
Glucose, Bld: 126 mg/dL — ABNORMAL HIGH (ref 65–99)
Potassium: 4.1 mmol/L (ref 3.5–5.1)
Sodium: 137 mmol/L (ref 135–145)

## 2017-07-23 LAB — GLUCOSE, CAPILLARY
Glucose-Capillary: 104 mg/dL — ABNORMAL HIGH (ref 65–99)
Glucose-Capillary: 99 mg/dL (ref 65–99)
Glucose-Capillary: 96 mg/dL (ref 65–99)
Glucose-Capillary: 98 mg/dL (ref 65–99)

## 2017-07-23 MED ORDER — ONDANSETRON HCL 4 MG PO TABS: 4.0000 mg | ORAL_TABLET | Freq: Four times a day (QID) | ORAL | 0 refills | Status: DC | PRN

## 2017-07-23 MED ORDER — APIXABAN 2.5 MG PO TABS: 2.5000 mg | ORAL_TABLET | Freq: Two times a day (BID) | ORAL | 0 refills | Status: DC

## 2017-07-23 MED ORDER — DOCUSATE SODIUM 100 MG PO CAPS: 100.0000 mg | ORAL_CAPSULE | Freq: Two times a day (BID) | ORAL | 0 refills | Status: DC

## 2017-07-23 MED ORDER — OXYCODONE HCL 10 MG PO TABS: 10.0000 mg | ORAL_TABLET | ORAL | 0 refills | Status: DC | PRN

## 2017-07-23 NOTE — Progress Notes (Signed)
    Subjective:  Patient reports pain as mild to moderate.  Denies N/V/CP/SOB. Walked in hall with PT.  Objective:   VITALS:   Vitals:   07/22/17 2200 07/23/17 0144 07/23/17 0622 07/23/17 0950  BP: 109/67 98/72 107/60 111/68  Pulse: 79 73 63 69  Resp:   18 17  Temp: 98.5 F (36.9 C) 99.3 F (37.4 C) 98.5 F (36.9 C) 98.9 F (37.2 C)  TempSrc: Oral Oral Oral Oral  SpO2: 98% 96% 93% 96%  Weight:      Height:        NAD ABD soft Sensation intact distally Intact pulses distally Dorsiflexion/Plantar flexion intact Incision: dressing C/D/I Compartment soft   Lab Results  Component Value Date   WBC 7.8 07/23/2017   HGB 9.5 (L) 07/23/2017   HCT 30.5 (L) 07/23/2017   MCV 72.4 (L) 07/23/2017   PLT 201 07/23/2017   BMET    Component Value Date/Time   NA 137 07/23/2017 0543   NA 139 03/15/2017 0920   K 4.1 07/23/2017 0543   K 3.7 03/15/2017 0920   CL 106 07/23/2017 0543   CO2 21 (L) 07/23/2017 0543   CO2 24 03/15/2017 0920   GLUCOSE 126 (H) 07/23/2017 0543   GLUCOSE 111 03/15/2017 0920   BUN 12 07/23/2017 0543   BUN 6.8 (L) 03/15/2017 0920   CREATININE 0.86 07/23/2017 0543   CREATININE 0.8 03/15/2017 0920   CALCIUM 8.2 (L) 07/23/2017 0543   CALCIUM 8.7 03/15/2017 0920   GFRNONAA >60 07/23/2017 0543   GFRAA >60 07/23/2017 0543     Assessment/Plan: 1 Day Post-Op   Principal Problem:   Osteoarthritis of left hip   WBAT with walker DVT ppx: apixaban, SCDs, TEDS PO pain control PT/OT Dispo: D/C home with HEP today vs tomorrow depending on family availability   Bertram Savin 07/23/2017, 10:11 AM   Rod Can, MD Cell [phone]

## 2017-07-23 NOTE — Discharge Summary (Signed)
Physician Discharge Summary  Patient ID: Juan [name] MRN: [id number] DOB/AGE: [birth year] 77 y.o.  Admit date: 07/22/2017 Discharge date: 07/24/2017  Admission Diagnoses:  Osteoarthritis of left hip  Discharge Diagnoses:  Principal Problem:   Osteoarthritis of left hip   Past Medical History:  Diagnosis Date  . Aortic atherosclerosis (HCC)   . Back pain   . Barrett esophagus   . Bleeding nose   . DDD (degenerative disc disease), thoracolumbar    Moderate to severe  . Diabetes mellitus without complication (HCC)   . Diverticulosis   . Esophageal cancer (HCC) dx'd 2017  . GERD (gastroesophageal reflux disease)   . Headache   . Hearing loss    Right ear  . History of umbilical hernia repair   . Idiopathic peripheral neuropathy   . Iron deficiency anemia   . LBBB (left bundle branch block) 02/11/2016  . Left hip pain    Severe degenerative changes  . Lipoma of neck    Right  . Mixed hyperlipidemia   . PONV (postoperative nausea and vomiting)   . Reflux   . Right thyroid nodule     Surgeries: Procedure(s): LEFT TOTAL HIP ARTHROPLASTY ANTERIOR APPROACH on 07/22/2017   Consultants (if any):   Discharged Condition: Improved  Hospital Course: Juan [name] is an 77 y.o. male who was admitted 07/22/2017 with a diagnosis of Osteoarthritis of left hip and went to the operating room on 07/22/2017 and underwent the above named procedures.    He was given perioperative antibiotics:  Anti-infectives (From admission, onward)   Start     Dose/Rate Route Frequency Ordered Stop   07/22/17 1930  ceFAZolin (ANCEF) IVPB 2g/100 mL premix     2 g 200 mL/hr over 30 Minutes Intravenous Every 6 hours 07/22/17 1630 07/23/17 0121   07/22/17 1050  ceFAZolin (ANCEF) 2-4 GM/100ML-% IVPB    Note to Pharmacy:  Curlene Dolphin   : cabinet override      07/22/17 1050 07/22/17 1245   07/22/17 1019  ceFAZolin (ANCEF) IVPB 2g/100 mL premix     2 g 200 mL/hr over 30 Minutes Intravenous On call to O.R.  07/22/17 1019 07/22/17 1315    .  He was given sequential compression devices, early ambulation, and apixaban for DVT prophylaxis.  He benefited maximally from the hospital stay and there were no complications.    Recent vital signs:  Vitals:   07/24/17 0746 07/24/17 1310  BP:  91/60  Pulse:  78  Resp:  16  Temp:  99.3 F (37.4 C)  SpO2: 92%     Recent laboratory studies:  Lab Results  Component Value Date   HGB 9.7 (L) 07/24/2017   HGB 9.5 (L) 07/23/2017   HGB 12.4 (L) 07/21/2017   Lab Results  Component Value Date   WBC 6.4 07/24/2017   PLT 172 07/24/2017   Lab Results  Component Value Date   INR 1.05 04/14/2016   Lab Results  Component Value Date   NA 137 07/23/2017   K 4.1 07/23/2017   CL 106 07/23/2017   CO2 21 (L) 07/23/2017   BUN 12 07/23/2017   CREATININE 0.86 07/23/2017   GLUCOSE 126 (H) 07/23/2017    Discharge Medications:   Allergies as of 07/24/2017   No Known Allergies     Medication List    STOP taking these medications   aspirin EC 81 MG tablet     TAKE these medications   alfuzosin 10 MG 24 hr tablet Commonly known  as:  UROXATRAL Take 10 mg by mouth daily with breakfast.   amitriptyline 10 MG tablet Commonly known as:  ELAVIL Take 10 mg by mouth at bedtime.   apixaban 2.5 MG Tabs tablet Commonly known as:  ELIQUIS Take 1 tablet (2.5 mg total) by mouth every 12 (twelve) hours.   atorvastatin 20 MG tablet Commonly known as:  LIPITOR Take 20 mg by mouth at bedtime.   docusate sodium 100 MG capsule Commonly known as:  COLACE Take 1 capsule (100 mg total) by mouth 2 (two) times daily.   Fluticasone Furoate 50 MCG/ACT Aepb Inhale into the lungs.   gabapentin 300 MG capsule Commonly known as:  NEURONTIN Take 300 mg by mouth 2 (two) times daily.   glimepiride 1 MG tablet Commonly known as:  AMARYL Take 1 mg by mouth daily with breakfast.   ondansetron 4 MG tablet Commonly known as:  ZOFRAN Take 1 tablet (4 mg total) by  mouth every 6 (six) hours as needed for nausea.   Oxycodone HCl 10 MG Tabs Take 1-1.5 tablets (10-15 mg total) by mouth every 4 (four) hours as needed for severe pain. What changed:    medication strength  how much to take  when to take this  reasons to take this   pantoprazole 40 MG tablet Commonly known as:  PROTONIX Take 1 tablet (40 mg total) by mouth daily.   prochlorperazine 10 MG tablet Commonly known as:  COMPAZINE Take 10 mg by mouth every 6 (six) hours as needed for nausea or vomiting.   sodium chloride 0.65 % Soln nasal spray Commonly known as:  OCEAN Place 1 spray into both nostrils as needed for congestion.   tiZANidine 2 MG tablet Commonly known as:  ZANAFLEX Take by mouth every 8 (eight) hours as needed for muscle spasms.       Diagnostic Studies: Dg Pelvis Portable  Result Date: 07/22/2017 CLINICAL DATA:  Left total hip replacement, anterior approach, Dr. Linna Caprice EXAM: PORTABLE PELVIS 1-2 VIEWS COMPARISON:  07/22/2017 and 06/22/2017 FINDINGS: Status post fall LEFT total hip arthroplasty. The femoral head component appears well seated in the acetabular component on the frontal view performed. There are degenerative changes in the RIGHT hip. No acute fracture. IMPRESSION: Status post LEFT hip arthroplasty.  No adverse features. Electronically Signed   By: Norva Pavlov M.D.   On: 07/22/2017 15:21   Dg C-arm 1-60 Min-no Report  Result Date: 07/22/2017 Fluoroscopy was utilized by the requesting physician.  No radiographic interpretation.   Dg Hip Operative Unilat W Or W/o Pelvis Left  Result Date: 07/22/2017 CLINICAL DATA:  Left hip replacement EXAM: OPERATIVE left HIP (WITH PELVIS IF PERFORMED) 4 VIEWS TECHNIQUE: Fluoroscopic spot image(s) were submitted for interpretation post-operatively. COMPARISON:  06/22/2017 FINDINGS: Total fluoroscopy time was 20.1 seconds. 4 low resolution intraoperative spot views of the left hip demonstrate a left hip replacement  with normal alignment. IMPRESSION: Intraoperative fluoroscopic assistance provided during left hip replacement. Electronically Signed   By: Jasmine Pang M.D.   On: 07/22/2017 14:35    Disposition:   Discharge Instructions    Call MD / Call 911   Complete by:  As directed    If you experience chest pain or shortness of breath, CALL 911 and be transported to the hospital emergency room.  If you develope a fever above 101 F, pus (white drainage) or increased drainage or redness at the wound, or calf pain, call your surgeon's office.   Call MD / Call 911  Complete by:  As directed    If you experience chest pain or shortness of breath, CALL 911 and be transported to the hospital emergency room.  If you develope a fever above 101 F, pus (white drainage) or increased drainage or redness at the wound, or calf pain, call your surgeon's office.   Constipation Prevention   Complete by:  As directed    Drink plenty of fluids.  Prune juice may be helpful.  You may use a stool softener, such as Colace (over the counter) 100 mg twice a day.  Use MiraLax (over the counter) for constipation as needed.   Constipation Prevention   Complete by:  As directed    Drink plenty of fluids.  Prune juice may be helpful.  You may use a stool softener, such as Colace (over the counter) 100 mg twice a day.  Use MiraLax (over the counter) for constipation as needed.   Diet - low sodium heart healthy   Complete by:  As directed    Diet - low sodium heart healthy   Complete by:  As directed    Increase activity slowly as tolerated   Complete by:  As directed    Increase activity slowly as tolerated   Complete by:  As directed       Follow-up Information    Kaeo Jacome, Arlys John, MD. Schedule an appointment as soon as possible for a visit in 2 weeks.   Specialty:  Orthopedic Surgery Why:  For wound re-check Contact information: [address] [place] [address] [phone]        [facility].   Specialty:  Home Health Services Why:  Home Health Physical Therapy-agency will call to arrange inital appointment Contact information: [address] [place] [address] [place] [address] [phone]           Signed: Iline Oven Peggyann Zwiefelhofer 07/27/2017, 11:42 AM

## 2017-07-23 NOTE — Evaluation (Signed)
Physical Therapy Evaluation Patient Details Name: Juan [name] MRN: [id number] DOB: 18-Mar-1941 Today's Date: 07/23/2017   History of Present Illness  Pt is a 77 year old male s/p L direct anterior THA  Clinical Impression  Pt is s/p THA resulting in the deficits listed below (see PT Problem List).  Pt will benefit from skilled PT to increase their independence and safety with mobility to allow discharge to the venue listed below.  Pt assisted with ambulating in hallway and performed LE exercises in recliner.  Pt uncertain of d/c plans as he states his wife works during the day.     Follow Up Recommendations Home health PT;Follow surgeon's recommendation for DC plan and follow-up therapies    Equipment Recommendations  None recommended by PT    Recommendations for Other Services       Precautions / Restrictions Precautions Precautions: None Restrictions Weight Bearing Restrictions: No Other Position/Activity Restrictions: WBAT      Mobility  Bed Mobility Overal bed mobility: Needs Assistance Bed Mobility: Supine to Sit     Supine to sit: Min guard     General bed mobility comments: verbal cues for technique  Transfers Overall transfer level: Needs assistance Equipment used: Rolling walker (2 wheeled) Transfers: Sit to/from Stand Sit to Stand: Min assist         General transfer comment: assist to rise and steady, verbal cues for UE and LE positioning  Ambulation/Gait Ambulation/Gait assistance: Min guard Ambulation Distance (Feet): 80 Feet Assistive device: Rolling walker (2 wheeled) Gait Pattern/deviations: Step-to pattern;Decreased weight shift to left;Decreased stance time - left;Antalgic     General Gait Details: verbal cues for sequence, step length, foot contact, distance to tolerance  Stairs            Wheelchair Mobility    Modified Rankin (Stroke Patients Only)       Balance                                              Pertinent Vitals/Pain Pain Assessment: 0-10 Pain Score: 4  Pain Location: L hip and thigh Pain Descriptors / Indicators: Aching;Sore Pain Intervention(s): Limited activity within patient's tolerance;Repositioned;Monitored during session    [place] expects to be discharged to:: Private residence Living Arrangements: Spouse/significant other   Type of Home: House Home Access: Stairs to enter Entrance Stairs-Rails: Right Entrance Stairs-Number of Steps: 3 Home Layout: One level Home Equipment: [place] - 2 wheels;Cane - single point Additional Comments: pt reports spouse works    Prior Function Level of Independence: Independent               Journalist, newspaper        Extremity/Trunk Assessment        Lower Extremity Assessment Lower Extremity Assessment: LLE deficits/detail LLE Deficits / Details: anticipated post op hip weakness, good quad contraction, hip flexion and abduction 2+/5       Communication   Communication: No difficulties  Cognition Arousal/Alertness: Awake/alert Behavior During Therapy: WFL for tasks assessed/performed Overall Cognitive Status: Within Functional Limits for tasks assessed                                        General Comments      Exercises Total Joint Exercises Ankle Circles/Pumps:  AROM;Both;10 reps Quad Sets: AROM;10 reps;Both Short Arc Quad: 10 reps;AAROM;Left;Seated Heel Slides: AAROM;10 reps;Left;Seated Hip ABduction/ADduction: AROM;10 reps;Seated;Left Long Arc Quad: AROM;10 reps;Left   Assessment/Plan    PT Assessment Patient needs continued PT services  PT Problem List Decreased strength;Decreased mobility;Decreased balance;Pain;Decreased range of motion;Decreased knowledge of use of DME       PT Treatment Interventions Stair training;Gait training;DME instruction;Therapeutic activities;Patient/family education;Therapeutic exercise;Functional mobility training    PT Goals  (Current goals can be found in the Care Plan section)  Acute Rehab PT Goals PT Goal Formulation: With patient Time For Goal Achievement: 07/28/17 Potential to Achieve Goals: Good    Frequency 7X/week   Barriers to discharge        Co-evaluation               AM-PAC PT "6 Clicks" Daily Activity  Outcome Measure Difficulty turning over in bed (including adjusting bedclothes, sheets and blankets)?: A Little Difficulty moving from lying on back to sitting on the side of the bed? : A Little Difficulty sitting down on and standing up from a chair with arms (e.g., wheelchair, bedside commode, etc,.)?: Unable Help needed moving to and from a bed to chair (including a wheelchair)?: A Little Help needed walking in hospital room?: A Little Help needed climbing 3-5 steps with a railing? : A Lot 6 Click Score: 15    End of Session Equipment Utilized During Treatment: Gait belt Activity Tolerance: Patient tolerated treatment well Patient left: in chair;with call bell/phone within reach Nurse Communication: Mobility status PT Visit Diagnosis: Other abnormalities of gait and mobility (R26.89)    Time: 2947-6546 PT Time Calculation (min) (ACUTE ONLY): 25 min   Charges:   PT Evaluation $PT Eval Low Complexity: 1 Low PT Treatments $Therapeutic Exercise: 8-22 mins   PT G Codes:        Carmelia Bake, PT, DPT 07/23/2017 Pager: [phone]  York Ram E 07/23/2017, 1:05 PM

## 2017-07-23 NOTE — Progress Notes (Signed)
Physical Therapy Treatment Patient Details Name: Juan [name] MRN: [id number] DOB: 1940-06-14 Today's Date: 07/23/2017    History of Present Illness Pt is a 77 year old male s/p L direct anterior THA    PT Comments    Pt assisted with ambulating in hallway and then back to bed. Pt reports plan is for d/c home tomorrow (however he would prefer to leave on Sunday).  Pt reports spouse is not working tomorrow, and he is mobilizing well currently so will return tomorrow to prepare for d/c home (anticipating d/c tomorrow).    Follow Up Recommendations  Home health PT;Follow surgeon's recommendation for DC plan and follow-up therapies     Equipment Recommendations  None recommended by PT    Recommendations for Other Services       Precautions / Restrictions Precautions Precautions: None Restrictions Other Position/Activity Restrictions: WBAT    Mobility  Bed Mobility Overal bed mobility: Needs Assistance Bed Mobility: Sit to Supine     Supine to sit: Min guard Sit to supine: Min assist   General bed mobility comments: verbal cues for technique, slight assist for L LE onto bed  Transfers Overall transfer level: Needs assistance Equipment used: Rolling walker (2 wheeled) Transfers: Sit to/from Stand Sit to Stand: Min guard         General transfer comment: verbal cues for UE and LE positioning  Ambulation/Gait Ambulation/Gait assistance: Min guard Ambulation Distance (Feet): 120 Feet Assistive device: Rolling walker (2 wheeled) Gait Pattern/deviations: Step-to pattern;Decreased weight shift to left;Decreased stance time - left;Antalgic     General Gait Details: verbal cues for sequence, step length, foot contact, distance to tolerance   Stairs             Wheelchair Mobility    Modified Rankin (Stroke Patients Only)       Balance                                            Cognition Arousal/Alertness: Awake/alert Behavior During  Therapy: WFL for tasks assessed/performed Overall Cognitive Status: Within Functional Limits for tasks assessed                                        Exercises     General Comments        Pertinent Vitals/Pain Pain Assessment: 0-10 Pain Score: 5  Pain Location: L hip and thigh Pain Descriptors / Indicators: Aching;Sore Pain Intervention(s): Repositioned;Limited activity within patient's tolerance;Ice applied;Monitored during session;Premedicated before session    Home Living Family/patient expects to be discharged to:: Private residence Living Arrangements: Spouse/significant other   Type of Home: House Home Access: Stairs to enter Entrance Stairs-Rails: Right Home Layout: One level Home Equipment: Environmental consultant - 2 wheels;Cane - single point Additional Comments: pt reports spouse works    Prior Function Level of Independence: Independent          PT Goals (current goals can now be found in the care plan section) Acute Rehab PT Goals PT Goal Formulation: With patient Time For Goal Achievement: 07/28/17 Potential to Achieve Goals: Good Progress towards PT goals: Progressing toward goals    Frequency    7X/week      PT Plan Current plan remains appropriate    Co-evaluation  AM-PAC PT "6 Clicks" Daily Activity  Outcome Measure  Difficulty turning over in bed (including adjusting bedclothes, sheets and blankets)?: A Little Difficulty moving from lying on back to sitting on the side of the bed? : Unable Difficulty sitting down on and standing up from a chair with arms ([name].g., wheelchair, bedside commode, etc,.)?: Unable Help needed moving to and from a bed to chair (including a wheelchair)?: A Little Help needed walking in hospital room?: A Little Help needed climbing 3-5 steps with a railing? : A Lot 6 Click Score: 13    End of Session Equipment Utilized During Treatment: Gait belt Activity Tolerance: Patient tolerated treatment  well Patient left: in bed;with call bell/phone within reach Nurse Communication: Mobility status PT Visit Diagnosis: Other abnormalities of gait and mobility (R26.89)     Time: 1400-1417 PT Time Calculation (min) (ACUTE ONLY): 17 min  Charges:  $Gait Training: 8-22 mins                  G Codes:       Carmelia Bake, PT, DPT 07/23/2017 Pager: [phone]   Juan [name] 07/23/2017, 3:27 PM

## 2017-07-24 LAB — CBC
HCT: 31 % — ABNORMAL LOW (ref 39.0–52.0)
Hemoglobin: 9.7 g/dL — ABNORMAL LOW (ref 13.0–17.0)
MCH: 22.8 pg — ABNORMAL LOW (ref 26.0–34.0)
MCHC: 31.3 g/dL (ref 30.0–36.0)
MCV: 72.9 fL — ABNORMAL LOW (ref 78.0–100.0)
Platelets: 172 10*3/uL (ref 150–400)
RBC: 4.25 MIL/uL (ref 4.22–5.81)
RDW: 15.9 % — ABNORMAL HIGH (ref 11.5–15.5)
WBC: 6.4 10*3/uL (ref 4.0–10.5)

## 2017-07-24 LAB — GLUCOSE, CAPILLARY
Glucose-Capillary: 111 mg/dL — ABNORMAL HIGH (ref 65–99)
Glucose-Capillary: 125 mg/dL — ABNORMAL HIGH (ref 65–99)

## 2017-07-24 NOTE — Plan of Care (Signed)
Plan of care discussed.   

## 2017-07-24 NOTE — Progress Notes (Signed)
    Subjective:  Patient reports pain as mild to moderate.  Denies N/V/CP/SOB. Walked in hall with PT.  Feels ready to dc home today.  Objective:   VITALS:   Vitals:   07/23/17 2152 07/24/17 0126 07/24/17 0546 07/24/17 0746  BP: 132/77 127/83 133/83   Pulse: 70 75 76   Resp: 20 20 20    Temp: 98.4 F (36.9 C) 99.2 F (37.3 C) 99.1 F (37.3 C)   TempSrc: Oral Oral Oral   SpO2: 100% (!) 85% 91% 92%  Weight:      Height:        NAD ABD soft Sensation intact distally Intact pulses distally Dorsiflexion/Plantar flexion intact Incision: dressing C/D/I Compartment soft   Lab Results  Component Value Date   WBC 6.4 07/24/2017   HGB 9.7 (L) 07/24/2017   HCT 31.0 (L) 07/24/2017   MCV 72.9 (L) 07/24/2017   PLT 172 07/24/2017   BMET    Component Value Date/Time   NA 137 07/23/2017 0543   NA 139 03/15/2017 0920   K 4.1 07/23/2017 0543   K 3.7 03/15/2017 0920   CL 106 07/23/2017 0543   CO2 21 (L) 07/23/2017 0543   CO2 24 03/15/2017 0920   GLUCOSE 126 (H) 07/23/2017 0543   GLUCOSE 111 03/15/2017 0920   BUN 12 07/23/2017 0543   BUN 6.8 (L) 03/15/2017 0920   CREATININE 0.86 07/23/2017 0543   CREATININE 0.8 03/15/2017 0920   CALCIUM 8.2 (L) 07/23/2017 0543   CALCIUM 8.7 03/15/2017 0920   GFRNONAA >60 07/23/2017 0543   GFRAA >60 07/23/2017 0543     Assessment/Plan: 2 Days Post-Op   Principal Problem:   Osteoarthritis of left hip   WBAT with walker DVT ppx: apixaban, SCDs, TEDS PO pain control PT/OT Dispo: D/C home with HEP today   Juan [name] 07/24/2017, 10:21 AM

## 2017-07-24 NOTE — Progress Notes (Signed)
Discharged from floor via w/c for transport home by car. Belongings & friend with pt. No changes in assessment. Lin Glazier  

## 2017-07-24 NOTE — Progress Notes (Signed)
Physical Therapy Treatment Patient Details Name: Juan [name] MRN: [id number] DOB: 1941-01-24 Today's Date: 07/24/2017    History of Present Illness Pt is a 77 year old male s/p L direct anterior THA    PT Comments    POD # 2 am session Assisted OOB to amb in hallway, amb to bathroom then positioned in recliner with ICE.     Follow Up Recommendations  Home health PT;Follow surgeon's recommendation for DC plan and follow-up therapies     Equipment Recommendations  None recommended by PT    Recommendations for Other Services       Precautions / Restrictions Precautions Precautions: None Restrictions Weight Bearing Restrictions: No Other Position/Activity Restrictions: WBAT    Mobility  Bed Mobility Overal bed mobility: Needs Assistance Bed Mobility: Supine to Sit     Supine to sit: Min guard     General bed mobility comments: verbal cues for technique, slight assist for L LE OOB  Transfers Overall transfer level: Needs assistance Equipment used: Rolling walker (2 wheeled) Transfers: Sit to/from Stand Sit to Stand: Min guard;Supervision         General transfer comment: verbal cues for UE and LE positioning and safety with turns  Ambulation/Gait Ambulation/Gait assistance: Supervision;Min guard Ambulation Distance (Feet): 85 Feet Assistive device: Rolling walker (2 wheeled) Gait Pattern/deviations: Step-to pattern;Decreased weight shift to left;Decreased stance time - left;Antalgic Gait velocity: decreasedc   General Gait Details: verbal cues for sequence, step length, foot contact, distance to tolerance   Stairs             Wheelchair Mobility    Modified Rankin (Stroke Patients Only)       Balance                                            Cognition Arousal/Alertness: Awake/alert Behavior During Therapy: WFL for tasks assessed/performed Overall Cognitive Status: Within Functional Limits for tasks assessed                                         Exercises      General Comments        Pertinent Vitals/Pain Pain Assessment: 0-10 Pain Score: 4  Pain Location: L hip and thigh Pain Descriptors / Indicators: Aching;Sore;Operative site guarding Pain Intervention(s): Monitored during session;Repositioned;Ice applied;Premedicated before session    Home Living                      Prior Function            PT Goals (current goals can now be found in the care plan section) Progress towards PT goals: Progressing toward goals    Frequency    7X/week      PT Plan Current plan remains appropriate    Co-evaluation              AM-PAC PT "6 Clicks" Daily Activity  Outcome Measure  Difficulty turning over in bed (including adjusting bedclothes, sheets and blankets)?: A Little Difficulty moving from lying on back to sitting on the side of the bed? : A Little Difficulty sitting down on and standing up from a chair with arms (e.g., wheelchair, bedside commode, etc,.)?: A Little   Help needed walking in hospital room?: A Little Help  needed climbing 3-5 steps with a railing? : A Lot 6 Click Score: 14    End of Session Equipment Utilized During Treatment: Gait belt Activity Tolerance: Patient tolerated treatment well Patient left: in chair;with call bell/phone within reach Nurse Communication: Mobility status PT Visit Diagnosis: Other abnormalities of gait and mobility (R26.89)     Time: 1000-1025 PT Time Calculation (min) (ACUTE ONLY): 25 min  Charges:  $Gait Training: 8-22 mins $Therapeutic Activity: 8-22 mins                    G Codes:       Rica Koyanagi  PTA [facility]  Acute  Rehab Pager      [phone]

## 2017-07-24 NOTE — Care Management Note (Signed)
Case Management Note  Patient Details  Name: Juan [name] MRN: [id number] Date of Birth: 1940-04-18  Subjective/Objective:     S/p THA               Action/Plan: NCM spoke to pt and offered choice for [facility]. Pt agreeable to [facility]. Pt states he has RW at home. Wife at home to assist with care. Contacted [facility] with new referral.     Expected Discharge Date:  07/24/17               Expected Discharge Plan:  [place]  In-House Referral:  NA  Discharge planning Services  CM Consult  Post Acute Care Choice:  Home Health Choice offered to:  Patient  DME Arranged:  N/A DME Agency:  NA  HH Arranged:  PT [place] Agency:  [facility] (formerly [employer])  Status of Service:  Completed, signed off  If discussed at [employer], dates discussed:    Additional Comments:  Erenest Rasher, RN 07/24/2017, 1:35 PM

## 2017-07-24 NOTE — Progress Notes (Signed)
Physical Therapy Treatment Patient Details Name: Juan [name] MRN: [id number] DOB: 07/30/1940 Today's Date: 07/24/2017    History of Present Illness Pt is a 77 year old male s/p L direct anterior THA    PT Comments    POD # 2 pm session Assisted with amb a second time, practiced stairs then performed some THR TE's following HEP handout.  Instructed on proper tech, freq as well as use of ICE.   Follow Up Recommendations  Home health PT;Follow surgeon's recommendation for DC plan and follow-up therapies     Equipment Recommendations  None recommended by PT    Recommendations for Other Services       Precautions / Restrictions Precautions Precautions: None Restrictions Weight Bearing Restrictions: No Other Position/Activity Restrictions: WBAT    Mobility  Bed Mobility Overal bed mobility: Needs Assistance Bed Mobility: Supine to Sit     Supine to sit: Min guard     General bed mobility comments: Pt OOB in recliner  Transfers Overall transfer level: Needs assistance Equipment used: Rolling walker (2 wheeled) Transfers: Sit to/from Stand Sit to Stand: Min guard;Supervision         General transfer comment: verbal cues for UE and LE positioning and safety with turns  Ambulation/Gait Ambulation/Gait assistance: Supervision;Min guard Ambulation Distance (Feet): 85 Feet Assistive device: Rolling walker (2 wheeled) Gait Pattern/deviations: Step-to pattern;Decreased weight shift to left;Decreased stance time - left;Antalgic Gait velocity: decreasedc   General Gait Details: verbal cues for sequence, step length, foot contact, distance to tolerance   Stairs Stairs: Yes Stairs assistance: Min guard Stair Management: One rail Left;Step to pattern(sideways) Number of Stairs: 2 General stair comments: 50% VC's on proper tech and safety   Wheelchair Mobility    Modified Rankin (Stroke Patients Only)       Balance                                            Cognition Arousal/Alertness: Awake/alert Behavior During Therapy: WFL for tasks assessed/performed Overall Cognitive Status: Within Functional Limits for tasks assessed                                        Exercises   Total Hip Replacement TE's 10 reps ankle pumps 10 reps knee presses 10 reps heel slides 10 reps SAQ's 10 reps ABD Followed by ICE     General Comments        Pertinent Vitals/Pain Pain Assessment: 0-10 Pain Score: 4  Pain Location: L hip and thigh Pain Descriptors / Indicators: Aching;Sore;Operative site guarding Pain Intervention(s): Monitored during session;Repositioned;Ice applied;Premedicated before session    Home Living                      Prior Function            PT Goals (current goals can now be found in the care plan section) Progress towards PT goals: Progressing toward goals    Frequency    7X/week      PT Plan Current plan remains appropriate    Co-evaluation              AM-PAC PT "6 Clicks" Daily Activity  Outcome Measure  Difficulty turning over in bed (including adjusting bedclothes, sheets and blankets)?: A Little  Difficulty moving from lying on back to sitting on the side of the bed? : A Little Difficulty sitting down on and standing up from a chair with arms (e.g., wheelchair, bedside commode, etc,.)?: A Little   Help needed walking in hospital room?: A Little Help needed climbing 3-5 steps with a railing? : A Lot 6 Click Score: 14    End of Session Equipment Utilized During Treatment: Gait belt Activity Tolerance: Patient tolerated treatment well Patient left: in chair;with call bell/phone within reach Nurse Communication: Mobility status PT Visit Diagnosis: Other abnormalities of gait and mobility (R26.89)     Time: 1000-1025 PT Time Calculation (min) (ACUTE ONLY): 25 min  Charges:  $Gait Training: 8-22 mins $Therapeutic Activity: 8-22 mins                     G Codes:       Rica Koyanagi  PTA [facility]  Acute  Rehab Pager      [phone]

## 2017-07-26 ENCOUNTER — Telehealth: Payer: Self-pay | Admitting: Hematology

## 2017-07-26 NOTE — Telephone Encounter (Signed)
Spoke to patient regarding voicemail he left earlier about date&time of upcoming may appointments.

## 2017-07-26 NOTE — Telephone Encounter (Signed)
Scheduled appt per 4/25 sch msg - attempted to vm for pt re appts that were added.

## 2017-08-05 ENCOUNTER — Encounter: Payer: Self-pay | Admitting: Cardiothoracic Surgery

## 2017-08-06 DIAGNOSIS — R6889 Other general symptoms and signs: Secondary | ICD-10-CM | POA: Diagnosis not present

## 2017-08-13 ENCOUNTER — Ambulatory Visit: Payer: Self-pay | Admitting: Hematology

## 2017-08-13 ENCOUNTER — Other Ambulatory Visit: Payer: Self-pay

## 2017-08-17 ENCOUNTER — Telehealth: Payer: Self-pay | Admitting: *Deleted

## 2017-08-17 NOTE — Telephone Encounter (Signed)
Called pt & he states he is doing well & able to come for MD visit.  Reminded visit was 08/27/17 @ 12:30 pm.  He states he may need to change this & will check with his wife & call scheduling if he does.

## 2017-08-26 NOTE — Progress Notes (Signed)
[facility]  Telephone:([phone] Fax:([phone]) [phone]  Clinic follow Up Note   Patient Care Team: [place], [place] Merrilee Seashore), MD (Inactive) as PCP - General (Internal Medicine) [name], MD as Consulting Physician (Hematology) Kyung Rudd, MD as Consulting Physician (Radiation Oncology) Juanita Craver, MD as Consulting Physician (Gastroenterology)  Date of Service:  08/27/2017   CHIEF COMPLAINTS:  Follow up esophageal squamous cell carcinoma  Oncology History   Presented with dysphagia and odynophagia with 20-25 pounds of weight loss in about 3-4 months  Esophageal cancer [facility])   Staging form: Esophagus - Adenocarcinoma, AJCC 7th Edition   - Clinical stage from 11/13/2015: Stage IIIB (T3, N2, M0, G2) - Signed by [name], MD on 12/11/2015      Esophageal cancer ([place])   11/13/2015 Procedure    UPPER ENDOSCOPY per Dr. Collene Mares: Large fungating, friable bleeding mass in middle third of esophagus, 22cm from incisors and extended to 27cm. Non-obstructing.      11/13/2015 Pathology Results    Invasive squamous cell carcinoma; moderately differentiated      11/13/2015 Imaging    CT ABD/PELVIS: IMPRESSION:No evidence of metastatic disease in the abdomen or pelvis. 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 Aortoiliac atherosclerosis. Small bilateral inguinal hernias containing fat the      11/22/2015 Initial Diagnosis    Esophageal cancer ([place])      12/03/2015 Imaging    PET scan showed a long segment of hypermetabolic thickening in the mid esophagus consistent with esophageal carcinoma. No clear evidence of node metastasis or distant metastasis.      12/10/2015 - 01/14/2016 Radiation Therapy    Neoadjuvant radiation to his esophageal cancer      12/10/2015 - 01/15/2016 Chemotherapy    Neoadjuvant weekly carboplatin AUC 2, and Taxol 45 mg/m, with concurrent radiation. He developed steroid-induced psychosis, and Taxol was changed to Abraxane to avoid premedication  with steroids.      02/24/2016 Imaging    CT CAP with contrast showed interval improvement mid esophageal mass, no evidence for metastatic adenopathy or distant metastasis.       03/10/2016 Procedure    Repeat EGD by Dr. Benson Norway showed wall thickening in the thoracic esophagus with the tumor was. The thickness decreased from 12.8 mm to 6-7 mm. Not able to differentiate between actual tumor versus fibrosis from radiation. Some peritumoral shoddy lymph nodes.      04/07/2016 Surgery    Patient followed up with Dr. Servando Snare, patient is not a great candidate for surgery, pt also does not want surgery, mutually agreed to not pursue esophagectomy.       06/01/2016 Imaging    CT C/A/P IMPRESSION: Mild mid esophageal wall thickening without residual mass on CT. Radiation changes in the paramediastinal lung bilaterally. No evidence of recurrent or metastatic disease.      08/18/2016 -  Hospital Admission    Patient presented to the ER with SOB and complaints of chest pain      11/07/2016 Imaging    Nm Pet Image Restag IMPRESSION: Negative PET-CT. No findings for recurrent esophageal cancer or metastatic disease.      01/07/2017 Procedure    EUS by Dr. Benson Norway 01/07/17 IMPRESSION - Normal esophagus. - Normal stomach. - Normal examined duodenum. - Wall thickening was seen in the upper third of the esophagus and in the middle third of the esophagus. - One benign lymph node was visualized in the middle paraesophageal mediastinum (level 54M). - No specimens collected.  05/10/2017 Imaging    CT CAP W Contrast 05/10/17 IMPRESSION: 1. No new or progressive findings to suggest recurrent or metastatic disease in the chest or abdomen on today's study. 2.  Aortic Atherosclerois (ICD10-170.0)       HISTORY OF PRESENTING ILLNESS:  Juan [name] 77 y.o. male is here because of His recently diagnosed esophageal cancer. He is accompanied by his wife to my clinic today.  He started having sore throat  and dysphagia about 4-5 months ago, and was seen by PCP, had 2 course antibiotics but is symptom progressed. He was referred to gastroenterologist Dr. Suezanne Cheshire, and underwent EGD on 11/13/2015, which showed a large fungating, friable bleeding mass in the middle third of esophagus, 22 cm to 27 cm from incisors, non-obstructing. The biopsy showed squamous cell carcinoma. His CT abdomen and pelvis was negative for metastatic disease.  He has lost 20lbs, he is on soft diet and liquids, his pain is about 7/10 pain with swallowing. He also has chest pain , intermittent, it lasts about a few minutes, no nausea or vomiting, his bowel movement is normal. He denies melena or hematochezia. He is able to function and tolerated light activities at home.  CURRENT THERAPY: Observation   INTERIM HISTORY:   Juan [name] is here for a follow up for his esophogeal cancer. He underwent Left total hip arthroplasty on 07/22/17. He presents to the clinic today by himself. He notes his hip is still but he is able to walk around with a cane. He was getting PT for 2 weeks. He is currently not working.   On review of symptoms, pt notes pain in his left hip. He denies pain anywhere else, no abdominal pain or trouble with his bowels. He notes he is eating well but loss some weight from his recent surgery.    MEDICAL HISTORY:  Past Medical History:  Diagnosis Date  . Aortic atherosclerosis ([place])   . Back pain   . Barrett esophagus   . Bleeding nose   . DDD (degenerative disc disease), thoracolumbar    Moderate to severe  . Diabetes mellitus without complication ([place])   . Diverticulosis   . Esophageal cancer ([place]) dx'd 2017  . GERD (gastroesophageal reflux disease)   . Headache   . Hearing loss    Right ear  . History of umbilical hernia repair   . Idiopathic peripheral neuropathy   . Iron deficiency anemia   . LBBB (left bundle branch block) 02/11/2016  . Left hip pain    Severe degenerative changes  . Lipoma of neck      Right  . Mixed hyperlipidemia   . PONV (postoperative nausea and vomiting)   . Reflux   . Right thyroid nodule     SURGICAL HISTORY: Past Surgical History:  Procedure Laterality Date  . COLONOSCOPY    . EUS N/A 12/05/2015   Procedure: UPPER ENDOSCOPIC ULTRASOUND (EUS) LINEAR;  Surgeon: Carol Ada, MD;  Location: [facility];  Service: Endoscopy;  Laterality: N/A;  . EUS N/A 03/10/2016   Procedure: UPPER ENDOSCOPIC ULTRASOUND (EUS) LINEAR;  Surgeon: Carol Ada, MD;  Location: [facility];  Service: Endoscopy;  Laterality: N/A;  . EUS N/A 01/07/2017   Procedure: UPPER ENDOSCOPIC ULTRASOUND (EUS) LINEAR;  Surgeon: Carol Ada, MD;  Location: [facility];  Service: Endoscopy;  Laterality: N/A;  . HERNIA REPAIR    . IR GENERIC HISTORICAL  12/24/2015   IR FLUORO GUIDE PORT INSERTION RIGHT 12/24/2015 Arne Cleveland, MD [facility]-INTER[facility] RAD  . IR  GENERIC HISTORICAL  12/24/2015   IR US GUIDE VASC ACCESS RIGHT 12/24/2015 Arne Cleveland, MD [facility]INTER[facility]  . PORTA CATH INSERTION    . SHOULDER ARTHROSCOPY W/ SUPERIOR LABRAL ANTERIOR POSTERIOR LESION REPAIR     times 2  . TOTAL HIP ARTHROPLASTY Left 07/22/2017   Procedure: LEFT TOTAL HIP ARTHROPLASTY ANTERIOR APPROACH;  Surgeon: Rod Can, MD;  Location: [facility];  Service: Orthopedics;  Laterality: Left;  . UPPER GI ENDOSCOPY  01/07/2017    SOCIAL HISTORY: Social History   Socioeconomic History  . Marital status: Married    Spouse name: Not on file  . Number of children: Not on file  . Years of education: Not on file  . Highest education level: Not on file  Occupational History  . Not on file  Social Needs  . Financial resource strain: Not on file  . Food insecurity:    Worry: Not on file    Inability: Not on file  . Transportation needs:    Medical: Not on file    Non-medical: Not on file  Tobacco Use  . Smoking status: Former Smoker    Packs/day: 2.00    Years: 40.00    Pack years: 80.00    Last attempt to quit: 03/31/1999     Years since quitting: 18.4  . Smokeless tobacco: Never Used  Substance and Sexual Activity  . Alcohol use: Not Currently    Comment: weekend binge drinking for 30-40 years, quit in 2001  . Drug use: No  . Sexual activity: Not on file  Lifestyle  . Physical activity:    Days per week: Not on file    Minutes per session: Not on file  . Stress: Not on file  Relationships  . Social connections:    Talks on phone: Not on file    Gets together: Not on file    Attends religious service: Not on file    Active member of club or organization: Not on file    Attends meetings of clubs or organizations: Not on file    Relationship status: Not on file  . Intimate partner violence:    Fear of current or ex partner: Not on file    Emotionally abused: Not on file    Physically abused: Not on file    Forced sexual activity: Not on file  Other Topics Concern  . 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 Colon cancer Father 39  . Colon cancer Brother 64  . Heart attack Brother   . Heart disease Mother   . Heart attack Mother   . Diabetes Sister   . Stroke Sister     ALLERGIES:  has No Known Allergies.  MEDICATIONS:  Current Outpatient Medications  Medication Sig Dispense Refill  . alfuzosin (UROXATRAL) 10 MG 24 hr tablet Take 10 mg by mouth daily with breakfast.    . apixaban (ELIQUIS) 2.5 MG TABS tablet Take 1 tablet (2.5 mg total) by mouth every 12 (twelve) hours. 60 tablet 0  . atorvastatin (LIPITOR) 20 MG tablet Take 20 mg by mouth at bedtime.     . docusate sodium (COLACE) 100 MG capsule Take 1 capsule (100 mg total) by mouth 2 (two) times  daily. 10 capsule 0  . Fluticasone Furoate 50 MCG/ACT AEPB Inhale into the lungs.    . gabapentin  (NEURONTIN) 300 MG capsule Take 300 mg by mouth 2 (two) times daily.    Marland Kitchen glimepiride (AMARYL) 1 MG tablet Take 1 mg by mouth daily with breakfast.    . ondansetron (ZOFRAN) 4 MG tablet Take 1 tablet (4 mg total) by mouth every 6 (six) hours as needed for nausea. 20 tablet 0  . oxyCODONE 10 MG TABS Take 1-1.5 tablets (10-15 mg total) by mouth every 4 (four) hours as needed for severe pain. 50 tablet 0  . pantoprazole (PROTONIX) 40 MG tablet Take 1 tablet (40 mg total) by mouth daily. 30 tablet 0  . prochlorperazine (COMPAZINE) 10 MG tablet Take 10 mg by mouth every 6 (six) hours as needed for nausea or vomiting.    . sodium chloride (OCEAN) 0.65 % SOLN nasal spray Place 1 spray into both nostrils as needed for congestion. 30 mL 2  . tiZANidine (ZANAFLEX) 2 MG tablet Take by mouth every 8 (eight) hours as needed for muscle spasms.    Marland Kitchen amitriptyline (ELAVIL) 10 MG tablet Take 10 mg by mouth at bedtime.      No current facility-administered medications for this visit.     REVIEW OF SYSTEMS:   Constitutional: Denies fevers, chills or abnormal night sweats. Denies fatigue. (+) weight loss  Eyes: Denies blurriness of vision, double vision or watery eyes Ears, nose, mouth, throat, and face: Denies mucositis or dysphagia  Respiratory: Denies cough, dyspnea or wheezes  Cardiovascular: Denies palpitation, chest discomfort or lower extremity swelling Gastrointestinal:  Denies nausea or change in bowel habits  Skin: Denies abnormal skin rashes Musculoskeletal: (+) arthritis, left hip pain much improved. Left hip stiffness  Lymphatics: Denies new lymphadenopathy or easy bruising Neurological:Denies numbness, tingling or new weaknesses Behavioral/Psych: Mood is stable, no new changes  All other systems were reviewed with the patient and are negative.  PHYSICAL EXAMINATION:  ECOG PERFORMANCE STATUS: 2  Vitals:   08/27/17 1325  BP: 124/77  Pulse: 64  Resp: 18  Temp: 98 F (36.7 C)  SpO2: 100%     Filed Weights   08/27/17 1325  Weight: 189 lb 8 oz (86 kg)    GENERAL:alert, no distress and comfortable SKIN: skin color, texture, turgor are normal, no rashes or significant lesions EYES: normal, conjunctiva are pink and non-injected, sclera clear OROPHARYNX:no exudate, no erythema and lips, buccal mucosa, and tongue normal  NECK: supple, thyroid normal size, non-tender, without nodularity LYMPH:  no palpable lymphadenopathy in the cervical, axillary or inguinal LUNGS: clear to auscultation and percussion with normal breathing effort HEART: regular rate & rhythm and no murmurs and no lower extremity edema ABDOMEN:abdomen soft, non-tender and normal bowel sounds Musculoskeletal:no cyanosis of digits and no clubbing  PSYCH: alert & oriented x 3 with fluent speech NEURO: no focal motor/sensory deficits  LABORATORY DATA:  I have reviewed the data as listed CBC Latest Ref Rng & Units 08/27/2017 07/24/2017 07/23/2017  WBC 4.0 - 10.3 K/uL 3.2(L) 6.4 7.8  Hemoglobin 13.0 - 17.1 g/dL 11.0(L) 9.7(L) 9.5(L)  Hematocrit 38.4 - 49.9 % 36.2(L) 31.0(L) 30.5(L)  Platelets 140 - 400 K/uL 257 172 201   CMP Latest Ref Rng & Units 07/23/2017 07/21/2017 05/10/2017  Glucose 65 - 99 mg/dL 126(H) 106(H) 108  BUN 6 - 20 mg/dL 12 7 8   Creatinine 0.61 - 1.24 mg/dL 0.86 0.85 0.91  Sodium 135 - 145 mmol/L 137  138 138  Potassium 3.5 - 5.1 mmol/L 4.1 4.2 4.4  Chloride 101 - 111 mmol/L 106 103 105  CO2 22 - 32 mmol/L 21(L) 25 25  Calcium 8.9 - 10.3 mg/dL 8.2(L) 9.2 9.0  Total Protein 6.4 - 8.3 g/dL - - 7.2  Total Bilirubin 0.2 - 1.2 mg/dL - - 0.4  Alkaline Phos 40 - 150 U/L - - 88  AST 5 - 34 U/L - - 15  ALT 0 - 55 U/L - - 10    PATHOLOGY REPORT  EGD 11/13/16 Final microscopic diagnosis Esophagus, 0.2 cm to 27 cm, biopsy -Invasive squamous cell carcinoma, moderately differentiated   PROCEDURES  EGD 11/13/2015 Dr. Collene Mares  -A large, fungating, friable mass with bleeding was found in the mid third of the  esophagus, 22 cm from the incisors and extended to 27 cm. The mass was nonobstructing and a circumferential, biopsy was taken. -Normal stomach -Normal proximal small bowel  Colonoscopy 11/13/2015 Dr. Collene Mares -Diverticulosis in the entire exam: -No specimens collected  EUS 12/05/2015 Malignant esophageal tumor was found in the upper third of the esophagus and in the middle third of the esophagus. - A mass was found in the cervical esophagus and in the thoracic esophagus. The diagnosis is squamous cell carcinoma. This was staged T3 N1/N2 Mx by endosonographic criteria. - No specimens collected.  EUS 03/10/2016 DR. HUNG  the esohpagus was thickened starting around 23 cm and it extended down in to the lower esophagus. Where the tumor was the thickest, prior to treatment, there was a marked reduction in volume. The thickness decreased from 12.8 mm down to 6-7.7 mm. Again, restaging post treatment is not the most reliable, however, it appears to be a T3. I cannot differentiate between actual tumor versus fibrosis/inflammation. Some peritumoral shoddy lymph nodes were noted. IMPRESSION:  - Benign-appearing esophageal stenosis. - Normal stomach. - Normal examined duodenum. - Wall thickening was seen in the thoracic esophagus. - No specimens collected.   EUS by Dr. Benson Norway 01/07/17 IMPRESSION - Normal esophagus. - Normal stomach. - Normal examined duodenum. - Wall thickening was seen in the upper third of the esophagus and in the middle third of the esophagus. - One benign lymph node was visualized in the middle paraesophageal mediastinum (level 26M). - No specimens collected.   RADIOGRAPHIC STUDIES: I have personally reviewed the radiological images as listed and agreed with the findings in the report. No results found. CT CAP W Contrast 05/10/17 IMPRESSION: 1. No new or progressive findings to suggest recurrent or metastatic disease in the chest or abdomen on today's study. 2.  Aortic  Atherosclerois (ICD10-170.0)  PET 11/07/16  IMPRESSION: Negative PET-CT. No findings for recurrent esophageal cancer or metastatic disease.   ASSESSMENT & PLAN: 77 y.o.  gentleman, without significant past medical history except acid reflux, presented with progressive dysphagia and odynophagia and weight loss.  1. Esophageal cancer, squamous cell carcinoma, cT3N1-2M0, stage IIIB, s/p chemoRT only  -I previously reviewed his EGD findings, biopsy results, and the CT scan findings with patient and his wife in great details -I previously dicussed staging PET scan findings with patient and his wife, the mid esophageal mass is hypermetabolic, no distant metastasis. -EUS revealed a T3 lesion, N1 vs N2, locally advanced disease. 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 Servando Snare has mutually agreed not to pursue esophagectomy due to his advanced age and general health condition -  We previously discussed that his esophageal cancer is unlikely cured by chemoradiation alone without surgery. I reviewed his repeated EUS after his chemotherapy and irradiation, which showed good response to chemoradiation, but possible residual tumor. -11/08/16 PET scan was normal without evidence of recurrence.  -His 01/07/17 EUS was unremarkable without evidence of residual disease -CT CAP from 05/10/17 revealed no new or progressive findings  to suggest recurrent or metastatic disease in the chest or abdomen. I discussed results with pt and he is pleased. Plan to repeat in 04/2018 -He is almost 2 years from diagnosis. Will continue to monitor his cancer closely for the first 2-3 years.  -He has loss some weight from his recent hip surgery. I encouraged him to work on regaining that weight.  -He is clinically doing well overall, will continue surveillance. F/u in 4-5 months. If he plans to see  Dr. Servando Snare I suggest he stagger our appointments.   2. Dysphagia, resolved  -His dysphagia has mostly resolved after chemotherapy and irradiation -follow up with dietician Pamala Hurry as needed -resolved.   3. History of steroids induced psychosis -Resolved now. We'll avoid steroids in the future.  4. Leukopenia and anemia -Secondary to chemotherapy radiation -WBC at 3.2 and Hg at 11 today (08/27/17) -We'll continue monitoring, no need for blood transfusion  5. Chronic back pain, left hip pain  -I previously encouraged the patient to follow with primary care physician for back pain -I will not refill his narcotics. -He has developed left hip pain since June 2018, which he contributes to arthritis. He follow up with his PCP. Managed with Gabapentin and oxycodone.   -PET scan in August 2018 was negative for metastasis -He underwent left total hip arthroplasty on 07/22/17 -His hip pain is much better, with some stiffness in his left hip. He walks with a crane    PLAN:  -Lab and f/u in 4 months    All questions were answered. The patient knows to call the clinic with any problems, questions or concerns.  I spent 20 minutes counseling the patient face to face. The total time spent in the appointment was 25 minutes and more than 50% was on counseling.  Oneal Deputy, am acting as scribe for [name], MD.   I have reviewed the above documentation for accuracy and completeness, and I agree with the above.     [name], MD 08/27/2017

## 2017-08-27 ENCOUNTER — Inpatient Hospital Stay: Payer: 59 | Attending: Hematology

## 2017-08-27 ENCOUNTER — Telehealth: Payer: Self-pay | Admitting: Hematology

## 2017-08-27 ENCOUNTER — Inpatient Hospital Stay (HOSPITAL_BASED_OUTPATIENT_CLINIC_OR_DEPARTMENT_OTHER): Payer: 59 | Admitting: Hematology

## 2017-08-27 VITALS — BP 124/77 | HR 64 | Temp 98.0°F | Resp 18 | Ht 69.0 in | Wt 189.5 lb

## 2017-08-27 DIAGNOSIS — Z8501 Personal history of malignant neoplasm of esophagus: Secondary | ICD-10-CM

## 2017-08-27 DIAGNOSIS — D509 Iron deficiency anemia, unspecified: Secondary | ICD-10-CM

## 2017-08-27 DIAGNOSIS — D72819 Decreased white blood cell count, unspecified: Secondary | ICD-10-CM

## 2017-08-27 DIAGNOSIS — D649 Anemia, unspecified: Secondary | ICD-10-CM | POA: Insufficient documentation

## 2017-08-27 DIAGNOSIS — M1612 Unilateral primary osteoarthritis, left hip: Secondary | ICD-10-CM

## 2017-08-27 DIAGNOSIS — C154 Malignant neoplasm of middle third of esophagus: Secondary | ICD-10-CM

## 2017-08-27 LAB — COMPREHENSIVE METABOLIC PANEL
ALT: 8 U/L (ref 0–55)
AST: 13 U/L (ref 5–34)
Albumin: 4.1 g/dL (ref 3.5–5.0)
Alkaline Phosphatase: 85 U/L (ref 40–150)
Anion gap: 10 (ref 3–11)
BUN: 11 mg/dL (ref 7–26)
CO2: 24 mmol/L (ref 22–29)
Calcium: 9.3 mg/dL (ref 8.4–10.4)
Chloride: 104 mmol/L (ref 98–109)
Creatinine, Ser: 0.93 mg/dL (ref 0.70–1.30)
GFR calc Af Amer: >60 mL/min (ref >60)
GFR calc non Af Amer: >60 mL/min (ref >60)
Glucose, Bld: 92 mg/dL (ref 70–140)
Potassium: 4.2 mmol/L (ref 3.5–5.1)
Sodium: 138 mmol/L (ref 136–145)
Total Bilirubin: 0.5 mg/dL (ref 0.2–1.2)
Total Protein: 7.9 g/dL (ref 6.4–8.3)

## 2017-08-27 LAB — IRON AND TIBC
Iron: 73 ug/dL (ref 42–163)
Saturation Ratios: 31 % — ABNORMAL LOW (ref 42–163)
TIBC: 239 ug/dL (ref 202–409)
UIBC: 165 ug/dL

## 2017-08-27 LAB — CBC WITH DIFFERENTIAL/PLATELET
Basophils Absolute: 0 10*3/uL (ref 0.0–0.1)
Basophils Relative: 1 %
Eosinophils Absolute: 0.1 10*3/uL (ref 0.0–0.5)
Eosinophils Relative: 4 %
HCT: 36.2 % — ABNORMAL LOW (ref 38.4–49.9)
Hemoglobin: 11 g/dL — ABNORMAL LOW (ref 13.0–17.1)
Lymphocytes Relative: 35 %
Lymphs Abs: 1.1 10*3/uL (ref 0.9–3.3)
MCH: 22.4 pg — ABNORMAL LOW (ref 27.2–33.4)
MCHC: 30.4 g/dL — ABNORMAL LOW (ref 32.0–36.0)
MCV: 73.7 fL — ABNORMAL LOW (ref 79.3–98.0)
Monocytes Absolute: 0.2 10*3/uL (ref 0.1–0.9)
Monocytes Relative: 6 %
Neutro Abs: 1.7 10*3/uL (ref 1.5–6.5)
Neutrophils Relative %: 54 %
Platelets: 257 10*3/uL (ref 140–400)
RBC: 4.91 MIL/uL (ref 4.20–5.82)
RDW: 15.8 % — ABNORMAL HIGH (ref 11.0–14.6)
WBC: 3.2 10*3/uL — ABNORMAL LOW (ref 4.0–10.3)

## 2017-08-27 LAB — FERRITIN: Ferritin: 105 ng/mL (ref 22–316)

## 2017-08-27 NOTE — Telephone Encounter (Signed)
Appointments scheduled Calendar/letter mailed to patient perm 5/31 los

## 2017-08-28 ENCOUNTER — Encounter: Payer: Self-pay | Admitting: Hematology

## 2017-09-09 ENCOUNTER — Encounter: Payer: Self-pay | Admitting: Cardiothoracic Surgery

## 2017-09-09 ENCOUNTER — Ambulatory Visit (INDEPENDENT_AMBULATORY_CARE_PROVIDER_SITE_OTHER): Payer: 59 | Admitting: Cardiothoracic Surgery

## 2017-09-09 ENCOUNTER — Other Ambulatory Visit: Payer: Self-pay

## 2017-09-09 VITALS — BP 143/85 | HR 59 | Resp 16 | Ht 69.0 in | Wt 196.2 lb

## 2017-09-09 DIAGNOSIS — Z9221 Personal history of antineoplastic chemotherapy: Secondary | ICD-10-CM | POA: Diagnosis not present

## 2017-09-09 DIAGNOSIS — C159 Malignant neoplasm of esophagus, unspecified: Secondary | ICD-10-CM | POA: Diagnosis not present

## 2017-09-09 DIAGNOSIS — Z923 Personal history of irradiation: Secondary | ICD-10-CM | POA: Diagnosis not present

## 2017-09-09 NOTE — Progress Notes (Signed)
[address]       [place] [address]             [phone]                    Leroi Zacharia [facility] Medical Record #[id number] Date of Birth: 11/05/1940  Referring: [name], MD Primary Care: [name], [name]), MD (Inactive)  Chief Complaint:    History of esophageal cancer  Cancer Staging Esophageal cancer [facility]) Staging form: Esophagus - Adenocarcinoma, AJCC 7th Edition - Clinical stage from 11/13/2015: Stage IIIB (T3, N2, M0, G2) - Signed by [name], MD on 12/11/2015   History of Present Illness:    Patient returns the office today for follow-up he originally presented in August 2017 with stage IIIb esophageal carcinoma.  He was treated with concomitant radiation and chemotherapy.  Ultimately because of his age and overall medical status it was decided not to proceed with esophageal resection.  He continues to take a p.o. diet well maintaining his weight denies any difficulty swallowing.  He is currently recovering from left hip replacement.     He was first diagnosed with esophageal cancer-squamous cell -[place] [id number]. The patient noted a "sore throat and painful swallowing for more than a year, associated with weight loss from 196-171. While living in [place] he had been told that he had Barrett's esophagus and had had episodic endoscopies, but I have no reports at this.  Dr. Collene Mares saw for a follow-up colonoscopy, at that time a upper GI endoscopy was performed. A second endoscopy was performed with esophageal ultrasound as noted below :    A mass was found in the cervical esophagus and in the thoracic esophagus. The diagnosis is squamous cell carcinoma. This was staged T3 N1/N2 Mx by endosonographic criteria. From the descriptions of endoscopy is not clear to me if this patient has extensive squamous cell carcinoma involving the cervical and mid thoracic esophagus or 2 separate lesions.          THERAPY: concurrent  chemotherapy and radiation, with weekly carboplatin AUC 2 and Taxol 45 mg/m, started on 12/10/2015 and ended January 13, 2017  He's completed radiation   therapy on October 18  and chemotherapy  on January 14 2016  Current Activity/ Functional Status:  Patient is independent with mobility/ambulation, transfers, ADL's, IADL's.   Zubrod Score: At the time of surgery this patient's most appropriate activity status/level should be described as: []     0    Normal activity, no symptoms [x]     1    Restricted in physical strenuous activity but ambulatory, able to do out light work []     2    Ambulatory and capable of self care, unable to do work activities, up and about               >50 % of waking hours                              []     3    Only limited self care, in bed greater than 50% of waking hours []     4    Completely disabled, no self care, confined to bed or chair []     5    Moribund   Past Medical History:  Diagnosis Date  . Aortic atherosclerosis ([place])   . Back pain   .  Barrett esophagus   . Bleeding nose   . DDD (degenerative disc disease), thoracolumbar    Moderate to severe  . Diabetes mellitus without complication ([place])   . Diverticulosis   . Esophageal cancer ([place]) dx'd 2017  . GERD (gastroesophageal reflux disease)   . Headache   . Hearing loss    Right ear  . History of umbilical hernia repair   . Idiopathic peripheral neuropathy   . Iron deficiency anemia   . LBBB (left bundle branch block) 02/11/2016  . Left hip pain    Severe degenerative changes  . Lipoma of neck    Right  . Mixed hyperlipidemia   . PONV (postoperative nausea and vomiting)   . Reflux   . Right thyroid nodule     Past Surgical History:  Procedure Laterality Date  . COLONOSCOPY    . EUS N/A 12/05/2015   Procedure: UPPER ENDOSCOPIC ULTRASOUND (EUS) LINEAR;  Surgeon: Carol Ada, MD;  Location: [facility];  Service: Endoscopy;  Laterality: N/A;  . EUS N/A 03/10/2016   Procedure:  UPPER ENDOSCOPIC ULTRASOUND (EUS) LINEAR;  Surgeon: Carol Ada, MD;  Location: [facility];  Service: Endoscopy;  Laterality: N/A;  . EUS N/A 01/07/2017   Procedure: UPPER ENDOSCOPIC ULTRASOUND (EUS) LINEAR;  Surgeon: Carol Ada, MD;  Location: [facility];  Service: Endoscopy;  Laterality: N/A;  . HERNIA REPAIR    . IR GENERIC HISTORICAL  12/24/2015   IR FLUORO GUIDE PORT INSERTION RIGHT 12/24/2015 Arne Cleveland, MD [facility]  . IR GENERIC HISTORICAL  12/24/2015   IR US GUIDE VASC ACCESS RIGHT 12/24/2015 Arne Cleveland, MD [facility]  . PORTA CATH INSERTION    . SHOULDER ARTHROSCOPY W/ SUPERIOR LABRAL ANTERIOR POSTERIOR LESION REPAIR     times 2  . TOTAL HIP ARTHROPLASTY Left 07/22/2017   Procedure: LEFT TOTAL HIP ARTHROPLASTY ANTERIOR APPROACH;  Surgeon: Rod Can, MD;  Location: [facility];  Service: Orthopedics;  Laterality: Left;  . UPPER GI ENDOSCOPY  01/07/2017    Family History  Problem Relation Age of Onset  . Colon cancer Father 28  . Colon cancer Brother 49  . Heart attack Brother   . Heart disease Mother   . Heart attack Mother   . Diabetes Sister   . Stroke Sister     Social History   Socioeconomic History  . Marital status: Married    Spouse name: Not on file  . Number of children: Not on file  . Years of education: Not on file  . Highest education level: Not on file  Occupational History  . Not on file  Social Needs  . Financial resource strain: Not on file  . Food insecurity:    Worry: Not on file    Inability: Not on file  . Transportation needs:    Medical: Not on file    Non-medical: Not on file  Tobacco Use  . Smoking status: Former Smoker    Packs/day: 2.00    Years: 40.00    Pack years: 80.00    Last attempt to quit: 03/31/1999    Years since quitting: 18.4  . Smokeless tobacco: Never Used  Substance and Sexual Activity  . Alcohol use: Not Currently    Comment: weekend binge drinking for 30-40 years, quit in 2001  . Drug use: No    . Sexual activity: Not on file  Lifestyle  . Physical activity:    Days per week: Not on file    Minutes per session: Not on  file  . Stress: Not on file  Relationships  . Social connections:    Talks on phone: Not on file    Gets together: Not on file    Attends religious service: Not on file    Active member of club or organization: Not on file    Attends meetings of clubs or organizations: Not on file    Relationship status: Not on file  . Intimate partner violence:    Fear of current or ex partner: Not on file    Emotionally abused: Not on file    Physically abused: Not on file    Forced sexual activity: Not on file  Other Topics Concern  . 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 Packs/day: 2.00  . Years: 40.00  . Pack years: 80.00  . Last attempt to quit: 03/31/1999  . Years since quitting: 18.4  Smokeless Tobacco Never Used    Social History   Substance and Sexual Activity  Alcohol Use Not Currently   Comment: weekend binge drinking for 30-40 years, quit in 2001     No Known Allergies  Current Outpatient Medications  Medication Sig Dispense Refill  . alfuzosin (UROXATRAL) 10 MG 24 hr tablet Take 10 mg by mouth daily with breakfast.    . amitriptyline (ELAVIL) 10 MG tablet Take 10 mg by mouth at bedtime.     Marland Kitchen apixaban (ELIQUIS) 2.5 MG TABS tablet Take 1 tablet (2.5 mg total) by mouth every 12 (twelve) hours. 60 tablet 0  . atorvastatin (LIPITOR) 20 MG tablet Take 20 mg by mouth at bedtime.     . docusate sodium (COLACE) 100 MG capsule Take 1 capsule (100 mg total) by mouth 2 (two) times daily. 10 capsule 0  . Fluticasone Furoate 50 MCG/ACT AEPB Inhale into the lungs.    . gabapentin (NEURONTIN) 300 MG capsule Take 300 mg by mouth 2 (two) times daily.    Marland Kitchen glimepiride (AMARYL) 1 MG tablet Take 1 mg by mouth  daily with breakfast.    . ondansetron (ZOFRAN) 4 MG tablet Take 1 tablet (4 mg total) by mouth every 6 (six) hours as needed for nausea. 20 tablet 0  . oxyCODONE 10 MG TABS Take 1-1.5 tablets (10-15 mg total) by mouth every 4 (four) hours as needed for severe pain. 50 tablet 0  . pantoprazole (PROTONIX) 40 MG tablet Take 1 tablet (40 mg total) by mouth daily. 30 tablet 0  . prochlorperazine (COMPAZINE) 10 MG tablet Take 10 mg by mouth every 6 (six) hours as needed for nausea or vomiting.    . sodium chloride (OCEAN) 0.65 % SOLN nasal spray Place 1 spray into both nostrils as needed for congestion. 30 mL 2  . tiZANidine (ZANAFLEX) 2 MG tablet Take by mouth every 8 (eight) hours as needed for muscle spasms.     No current facility-administered medications for this visit.       Review of Systems:         Review of Systems  Constitutional: Negative.   HENT: Negative.   Eyes: Negative.   Respiratory: Negative.   Cardiovascular: Negative for chest pain, palpitations, orthopnea, claudication, leg swelling and PND.  Gastrointestinal: Negative.   Genitourinary: Negative.   Musculoskeletal: Positive for joint pain.  Skin: Negative.   Neurological: Negative.   Endo/Heme/Allergies:  Negative.   Psychiatric/Behavioral: Negative.     Physical Exam: BP (!) 143/85 (BP Location: Left Arm, Patient Position: Sitting, Cuff Size: Normal)   Pulse (!) 59   Resp 16   Ht 5\' 9"  (1.753 m)   Wt 196 lb 3.2 oz (89 kg)   SpO2 97% Comment: RA  BMI 28.97 kg/m   PHYSICAL EXAMINATION:  General appearance: alert, cooperative and no distress Head: Normocephalic, without obvious abnormality, atraumatic Neck: no adenopathy, no carotid bruit, no JVD, supple, symmetrical, trachea midline and thyroid not enlarged, symmetric, no tenderness/mass/nodules Lymph nodes: Cervical, supraclavicular, and axillary nodes normal. Resp: clear to auscultation bilaterally Cardio: regular rate and rhythm, S1, S2 normal, no  murmur, click, rub or gallop GI: soft, non-tender; bowel sounds normal; no masses,  no organomegaly Extremities: Walking with a cane from recent left hip surgery Neurologic: Grossly normal  Diagnostic Studies & Laboratory data:     Recent Radiology Findings:  CLINICAL DATA:  Dyspnea. History of reflux, esophageal cancer, diabetes  EXAM: CT ANGIOGRAPHY CHEST WITH CONTRAST  TECHNIQUE: Multidetector CT imaging of the chest was performed using the standard protocol during bolus administration of intravenous contrast. Multiplanar CT image reconstructions and MIPs were obtained to evaluate the vascular anatomy.  CONTRAST:  79 mL Isovue 370  COMPARISON:  06/01/2016  FINDINGS: Cardiovascular: Satisfactory opacification of the pulmonary arteries to the segmental level. No evidence of pulmonary embolism. Normal heart size. No pericardial effusion.  Mediastinum/Nodes: No significant lymphadenopathy in the chest. Esophagus is decompressed. Difficult to evaluate for residual wall thickness due to decompression. Thyroid gland is unremarkable. Right central venous catheter with tip in the superior vena cava.  Lungs/Pleura: Evaluation is limited due to motion artifact. Hazy opacities in both lungs, more prominent posteriorly, suggesting edema or pneumonia. This is new since previous study. No pleural effusions. No pneumothorax. Airways appear patent. Small bulla in the anterior left lung.  Upper Abdomen: Calcified granuloma in the spleen.  Musculoskeletal: Degenerative changes in the spine. No destructive bone lesions.  Review of the MIP images confirms the above findings.  IMPRESSION: No evidence of significant pulmonary embolus. Hazy opacities in both lungs suggesting edema or pneumonia, new since previous study.   Electronically Signed   By: Lucienne Capers M.D.   On: 08/19/2016 03:25     Ct Chest W Contrast & Ct Abdomen Pelvis W Contrast  Result Date:  02/24/2016 CLINICAL DATA:  Restaging esophageal carcinoma EXAM: CT CHEST, ABDOMEN, AND PELVIS WITH CONTRAST TECHNIQUE: Multidetector CT imaging of the chest, abdomen and pelvis was performed following the standard protocol during bolus administration of intravenous contrast. CONTRAST:  17mL ISOVUE-300 IOPAMIDOL (ISOVUE-300) INJECTION 61% COMPARISON:  PET-CT from 12/03/2015 FINDINGS: CT CHEST FINDINGS Cardiovascular: The heart size is normal. Aortic atherosclerosis identified. LAD and left circumflex coronary artery calcifications identified. Mediastinum/Nodes: No mediastinal or hilar adenopathy identified. Circumferential wall thickening involving the mid thoracic esophagus is again noted. This appears improved from previous exam. At the level of the aortic arch the esophagus, at its thickest, measures 1.8 x 1.8 cm, image 16 of series 2. Previously 2.6 x 3.3 cm. Lungs/Pleura: No pleural effusion identified. Mild changes of centrilobular emphysema no suspicious pulmonary nodules identified. Musculoskeletal: Degenerative disc disease is identified within the thoracic spine. No chest wall mass or suspicious bone lesions identified. CT ABDOMEN PELVIS FINDINGS Hepatobiliary: No focal liver abnormality is seen. No gallstones, gallbladder wall thickening, or biliary dilatation. Pancreas: Unremarkable. No pancreatic ductal dilatation or surrounding inflammatory changes. Spleen: Calcified granulomas identified within the  spleen. Adrenals/Urinary Tract: The adrenal glands are normal. Parapelvic cyst identified within the left kidney measuring 1.4 cm, image 58 of series 2. Urinary bladder appears normal. Stomach/Bowel: The stomach is normal. The small bowel loops have a normal course and caliber. No obstruction. Unremarkable appearance of the colon. Vascular/Lymphatic: Aortic atherosclerosis. No enlarged upper abdominal lymph nodes. No pelvic or inguinal adenopathy. Reproductive: Prostate is unremarkable. Other: There is no  ascites or focal fluid collections within the abdomen or pelvis. Musculoskeletal: No suspicious bone lesions identified. Spondylosis noted within the lumbar spine. IMPRESSION: 1. Interval improvement and mid esophageal mass. 2. No evidence for metastatic adenopathy or distant metastatic disease. 3. Aortic atherosclerosis and coronary artery calcification. Electronically Signed   By: Kerby Moors M.D.   On: 02/24/2016 16:33  Ct Head Wo Contrast  Result Date: 12/19/2015 CLINICAL DATA:  Patient with history of psychiatric issues. History of esophageal carcinoma. EXAM: CT HEAD WITHOUT CONTRAST TECHNIQUE: Contiguous axial images were obtained from the base of the skull through the vertex without intravenous contrast. COMPARISON:  Brain CT 01/05/2014 FINDINGS: Brain: Ventricles and sulci are appropriate for patient's age. No evidence for acute cortically based infarct, intracranial hemorrhage, mass lesion or mass-effect. Vascular: No hyperdense vessel or unexpected calcification. Skull: Normal. Negative for fracture or focal lesion. Sinuses/Orbits: No acute finding. Other: None. IMPRESSION: No acute intracranial process. Electronically Signed   By: Lovey Newcomer M.D.   On: 12/19/2015 15:51   Nm Pet Image Initial (pi) Skull Base To Thigh  Result Date: 12/03/2015 CLINICAL DATA:  Initial treatment strategy for esophageal cancer of the middle third of the esophagus. EXAM: NUCLEAR MEDICINE PET SKULL BASE TO THIGH TECHNIQUE: 12.7 mCi F-18 FDG was injected intravenously. Full-ring PET imaging was performed from the skull base to thigh after the radiotracer. CT data was obtained and used for attenuation correction and anatomic localization. FASTING BLOOD GLUCOSE:  Value: 11 mg/dl COMPARISON:  CT abdomen 11/13/2015 FINDINGS: NECK No hypermetabolic lymph nodes in the neck. CHEST A 3 cm segment of circumferential esophageal wall thickening at the level of the carina with intense metabolic activity (SUV max equal 18.2). Single  wall thickness measures 1.5 cm High RIGHT paratracheal lymph node measures 7 mm (image 964, series 4) with low metabolic activity the hypermetabolic activity (SUV max equaled 2.1). No hypermetabolic paraesophageal lymph nodes. No suspicious pulmonary nodules. ABDOMEN/PELVIS No hypermetabolic gastrohepatic ligament nodes. No abnormal metabolic activity in the liver. No hypermetabolic retroperitoneal or pelvic lymph nodes. Physiologic activity noted within the colon. SKELETON No focal hypermetabolic activity to suggest skeletal metastasis. IMPRESSION: 1. Long segment of hypermetabolic thickening in the mid esophagus consistent with esophageal carcinoma. 2. No clear evidence of mediastinal nodal metastasis. Single high RIGHT paratracheal lymph node with low metabolic activity 3. No evidence of liver metastasis or upper abdominal nodal metastasis Electronically Signed   By: Suzy Bouchard M.D.   On: 12/03/2015 10:53     I have independently reviewed the above radiology studies  and reviewed the findings with the patient.     Recent Lab Findings: Lab Results  Component Value Date   WBC 3.2 (L) 08/27/2017   HGB 11.0 (L) 08/27/2017   HCT 36.2 (L) 08/27/2017   PLT 257 08/27/2017   GLUCOSE 92 08/27/2017   ALT 8 08/27/2017   AST 13 08/27/2017   NA 138 08/27/2017   K 4.2 08/27/2017   CL 104 08/27/2017   CREATININE 0.93 08/27/2017   BUN 11 08/27/2017   CO2 24 08/27/2017   INR  1.05 04/14/2016   HGBA1C 6.2 (H) 07/22/2017   Findings: A large, ulcerating mass with no bleeding and stigmata of recent bleeding was found in the upper third of the esophagus and in the middle third of the esophagus. The mass was non-obstructing and partially circumferential (involving one-half of the lumen circumference). Endosonographic Finding A mass was found in the cervical esophagus and in the thoracic esophagus. The procedure was difficult to perform as a result of patient tolerance issues combined with rapid HR  and severe HTN. In the upper to mid esophagus a large protruding mass was identified. The surrounding mucosa was friable and ulcerated. There was some initial difficulty with passing the radial echoendoscope through this area. Because of the patient's combativeness and the protrusion of the mass I was not able to obtain an accurate measurement of the mass with endoscopic visualization. Evaluation with ultrasound was challenging, but a large eccentric hypoechoic mass with irregular borders was identified. In the proximal portion it appeared to be noncircumfirential, but in the mid portion and distally the lesion eccentrically circumfirential. At the largest portion of the mass, it measured 12.8 mm in depth. At this point, the mass protruded beyond the adventitia (T3). In the area it was not clear to me if the mass extended or if it was a collection of lymph nodes. A couple of peritumoral lymph nodes were identified just proximal to this area. Evaluation of the Celiac axis was negative for any evidence of lymph nodes. The left lobe of the liver was negative for any suspicious lesions and the left adrenal was normal in appearance. - Malignant esophageal tumor was found in the upper third of the esophagus and in the middle third of the esophagus. - A mass was found in the cervical esophagus and in the thoracic esophagus. The diagnosis is squamous cell carcinoma. This was staged T3 N1/N2 Mx by endosonographic criteria. - No specimens collected  Repeat EUS: 03/10/2016 Findings: One moderate benign-appearing, intrinsic stenosis was found 25 cm from the incisors. This measured 1.1 cm (inner diameter) x 1 cm (in length) and was traversed. The entire examined stomach was endoscopically normal. The examined duodenum was endoscopically normal. Endosonographic Finding Localized wall thickening was visualized endosonographically in the thoracic esophagus. The thickness of the abnormal layers measured 6  mm. The esophageal wall measured up to 8 mm in total thickness. Gross visualization of the esophagus revealed a marked improvement. There was no gross evidence of any mass in the esophageal lumen. Stenosis was encountered at 25 cm, but with gentle pressure the echoendoscope was able to be maneuvered through the area. A mild dilation of the mucosa occurred. With the ultrasound view, the esohpagus was thickened starting around 23 cm and it extended down in to the lower esophagus. Where the tumor was the thickest, prior to treatment, there was a marked reduction in volume. The thickness decreased from 12.8 mm down to 6-7.7 mm. Again, restaging post treatment is not the most reliable, however, it appears to be a T3. I cannot differentiate between actual tumor versus fibrosis/inflammation. Some peritumoral shoddy lymph nodes were noted.   12/2016 endoscopy with  EUS  - Normal esophagus. - Normal stomach. - Normal examined duodenum. - Wall thickening was seen in the upper third of the esophagus and in the middle third of the esophagus. - One benign lymph node was visualized in the middle paraesophageal mediastinum (level 38M). - No specimens collected.    Assessment / Plan:   Patient now almost 2 years  after original treatment for stage IIIb adenocarcinoma of the esophagus treated with radiation and chemotherapy.  Ultimately because of the patient's medical status surgical resection was not pursued.  In spite of this the patient appears to be doing well without evidence of recurrent disease.  He is closely followed in oncology, I have not made a return appointment for him to see me but would be glad to see him at his or oncology requested anytime.  Grace Isaac MD      [address] ,[place] [address] Office [phone]   Beeper [phone]  09/09/2017 12:23 PM

## 2017-09-19 IMAGING — CT NM PET TUM IMG INITIAL (PI) SKULL BASE T - THIGH
9 series · 21 of 25 positions shown · non-contrast
Comparison: CT abdomen 11/13/2015

CLINICAL DATA: Initial treatment strategy for esophageal cancer of
the middle third of the esophagus..

EXAM:
NUCLEAR MEDICINE PET SKULL BASE TO THIGH
TECHNIQUE: 12.7 mCi F-18 FDG was injected intravenously. Full-ring PET imaging
was performed from the skull base to thigh after the radiotracer. CT
data was obtained and used for attenuation correction and anatomic
localization.
FASTING BLOOD GLUCOSE:  Value: 11 mg/dl

[Series 3: pet wb (ac) · axial · 5.0mm · 4.07mm/px · z∈[-1426,-850]mm · 3 of 290 slices shown]
[im 1/290]
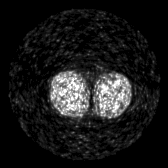
[im 97/290]
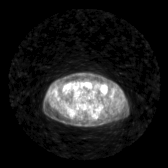
[im 193/290]
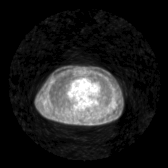

[Series 5: pet wb uncorrected (nac) · axial · 5.0mm · 4.07mm/px · z∈[-1426,-559]mm · 4 of 290 slices shown]
[im 1/290]
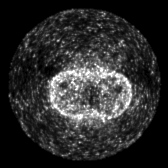
[im 97/290]
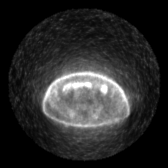
[im 193/290]
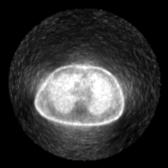
[im 290/290]
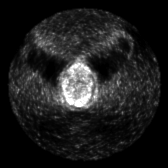

[Series 603: fused axial · 3 of 288 slices shown]
[im 1/288]
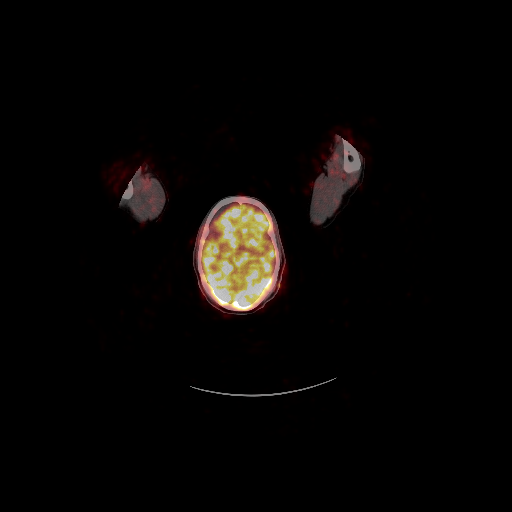
[im 192/288]
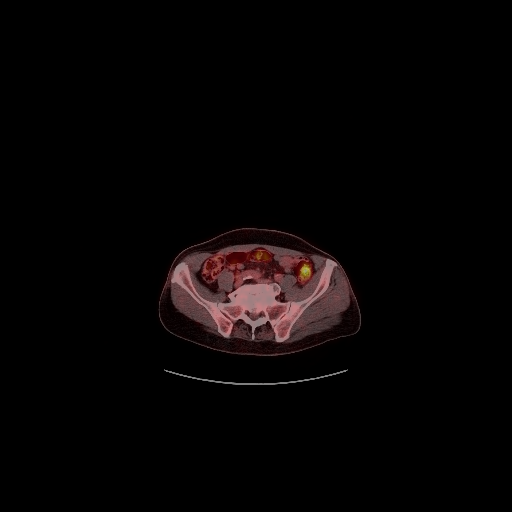
[im 288/288]
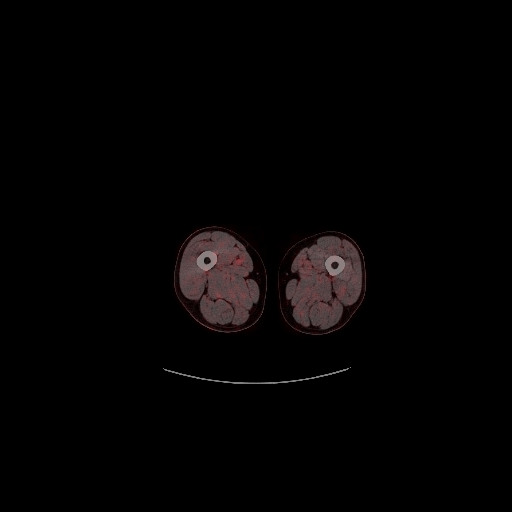

[Series 604: fused coronal · 1 of 73 slices shown]
[im 1/73]
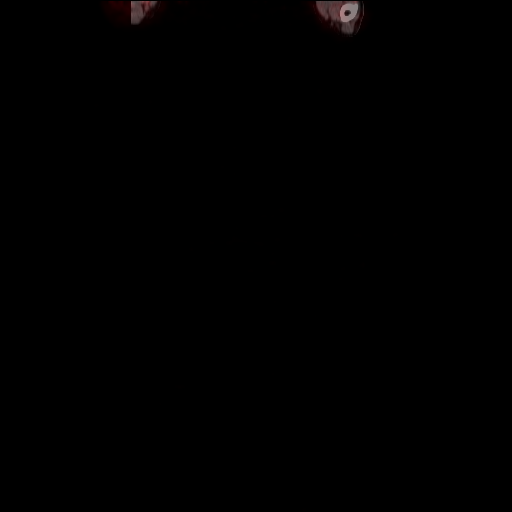

[Series 605: fused sagittal · 2 of 118 slices shown]
[im 1/118]
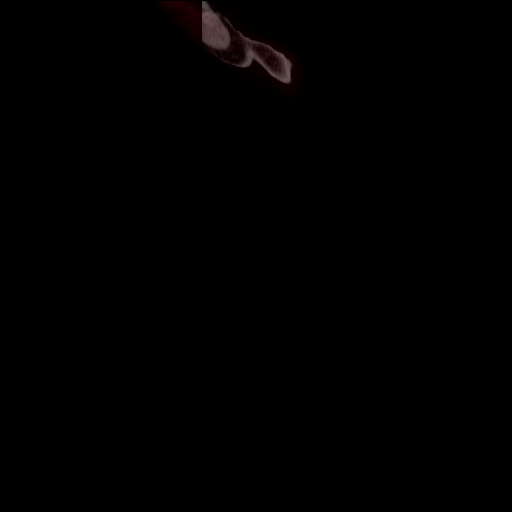
[im 118/118]
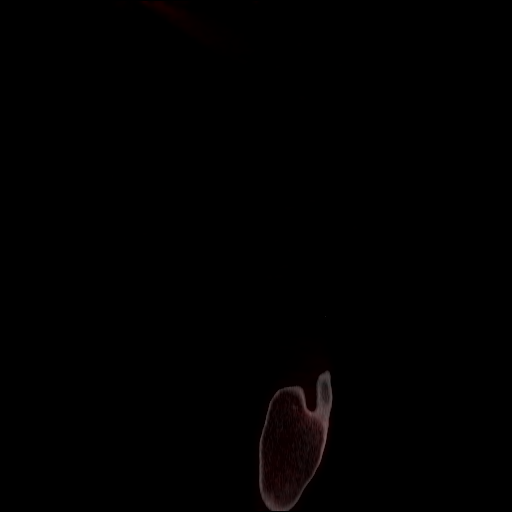

[Series 606: pet axial · 4 of 290 slices shown]
[im 73/290]
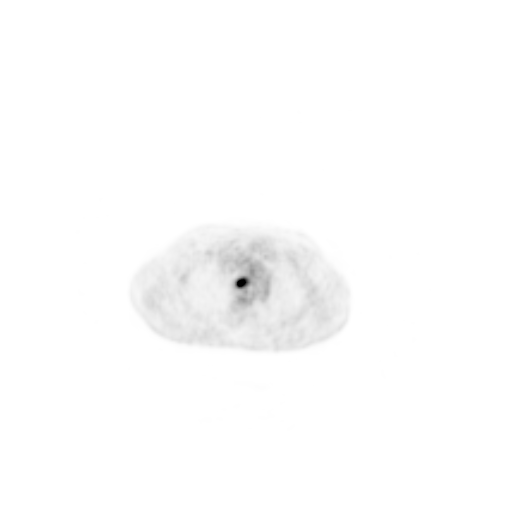
[im 145/290]
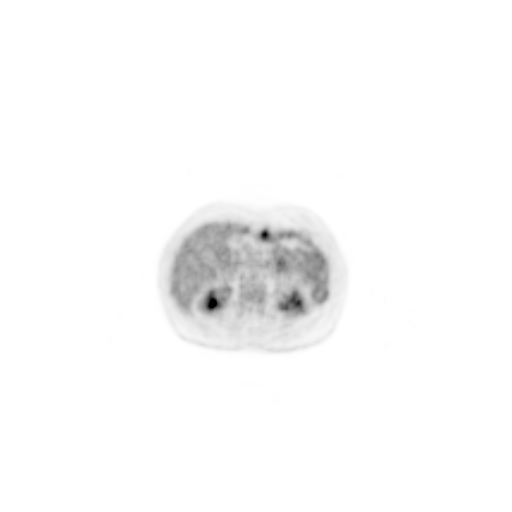
[im 217/290]
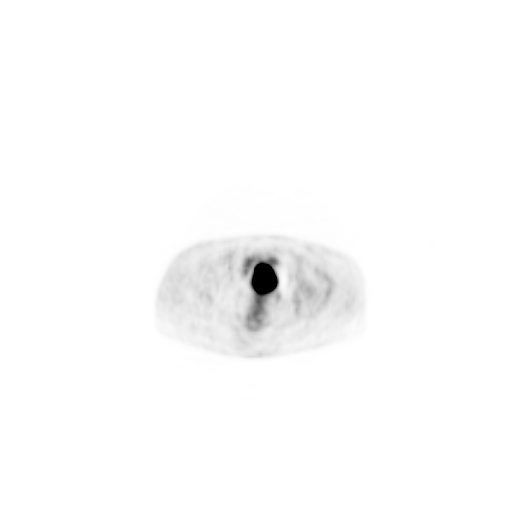
[im 290/290]
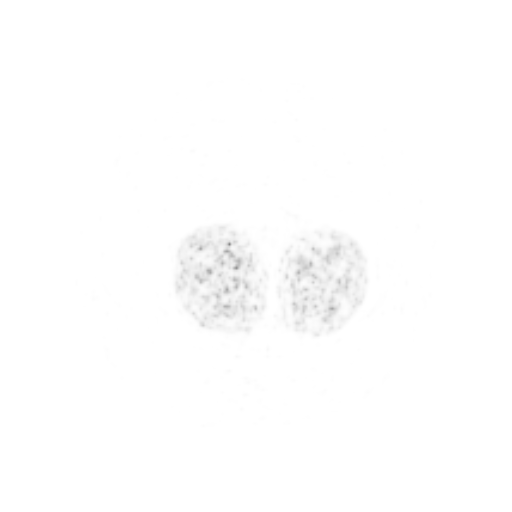

[Series 607: pet coronal · 1 of 96 slices shown]
[im 1/96]
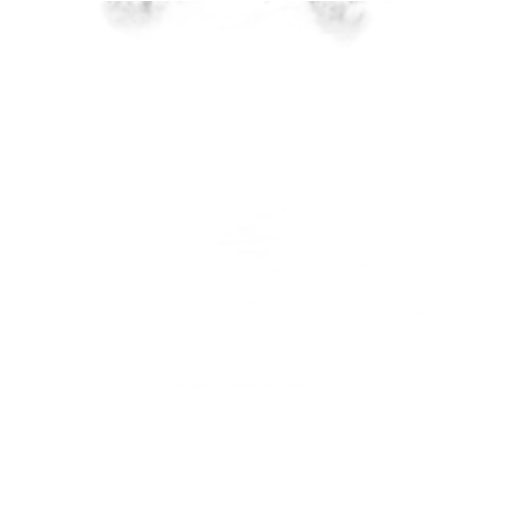

[Series 608: pet sagittal · 2 of 135 slices shown]
[im 1/135]
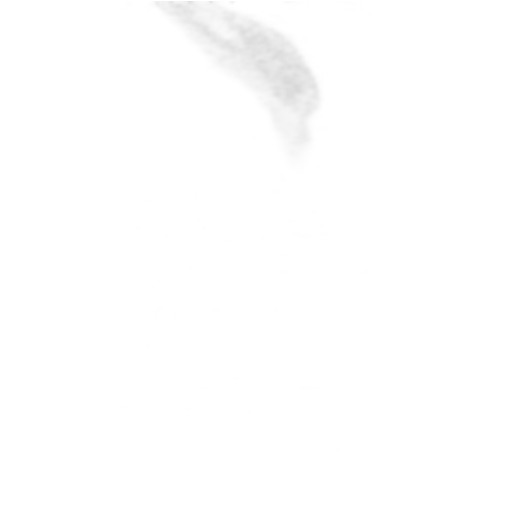
[im 135/135]
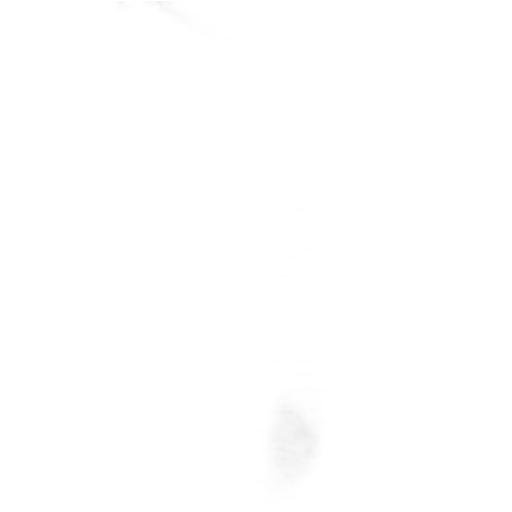

[Series 1038: results mm oncology reading · 0.50mm/px · 1 of 3 slices shown]
[im 1/3]
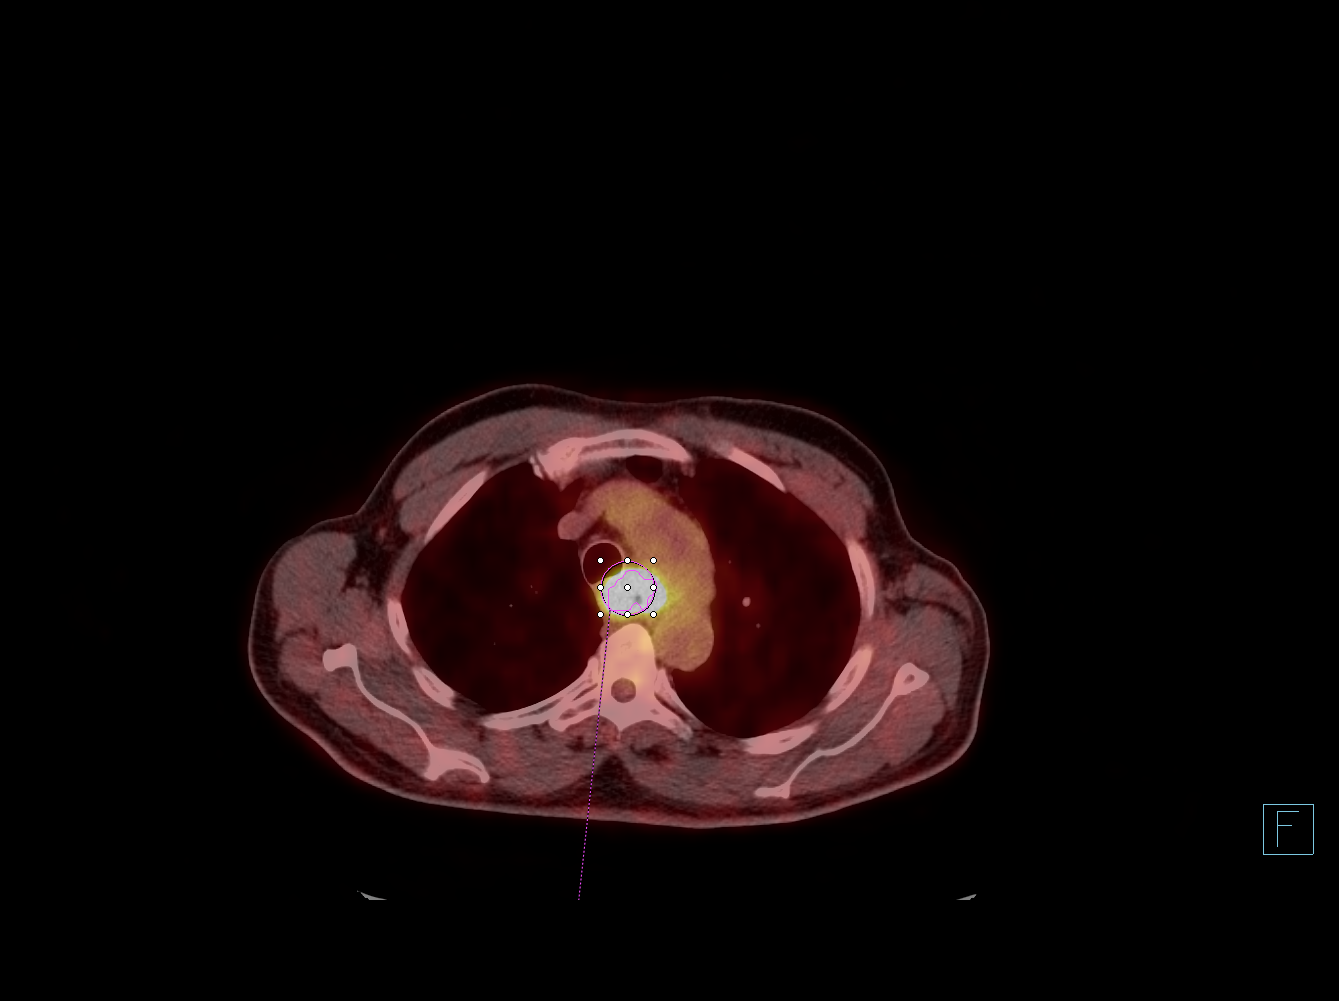

[21 of 25 positions shown; findings below may reference images not displayed]

FINDINGS: NECK

No hypermetabolic lymph nodes in the neck.

CHEST

A 3 cm segment of circumferential esophageal wall thickening at the
level of the carina with intense metabolic activity (SUV max equal
18.2). Single wall thickness measures 1.5 cm

High RIGHT paratracheal lymph node measures 7 mm (image 964, series
4) with low metabolic activity the hypermetabolic activity (SUV max
equaled 2.1). No hypermetabolic paraesophageal lymph nodes.

No suspicious pulmonary nodules.

ABDOMEN/PELVIS

No hypermetabolic gastrohepatic ligament nodes. No abnormal
metabolic activity in the liver. No hypermetabolic retroperitoneal
or pelvic lymph nodes.

Physiologic activity noted within the colon.

SKELETON

No focal hypermetabolic activity to suggest skeletal metastasis.
IMPRESSION: 1. Long segment of hypermetabolic thickening in the mid esophagus
consistent with esophageal carcinoma.
2. No clear evidence of mediastinal nodal metastasis. Single high
RIGHT paratracheal lymph node with low metabolic activity
3. No evidence of liver metastasis or upper abdominal nodal
metastasis

## 2017-09-20 IMAGING — CR DG CHEST 2V
2 series · 2 of 2 positions shown · non-contrast
Comparison: PETCT dated 12/03/2015

CLINICAL DATA: 75-year-old male with intermittent midsternal chest
pain. Recent diagnosis of esophageal cancer.

EXAM:
CHEST  2 VIEW

[w chest pa]
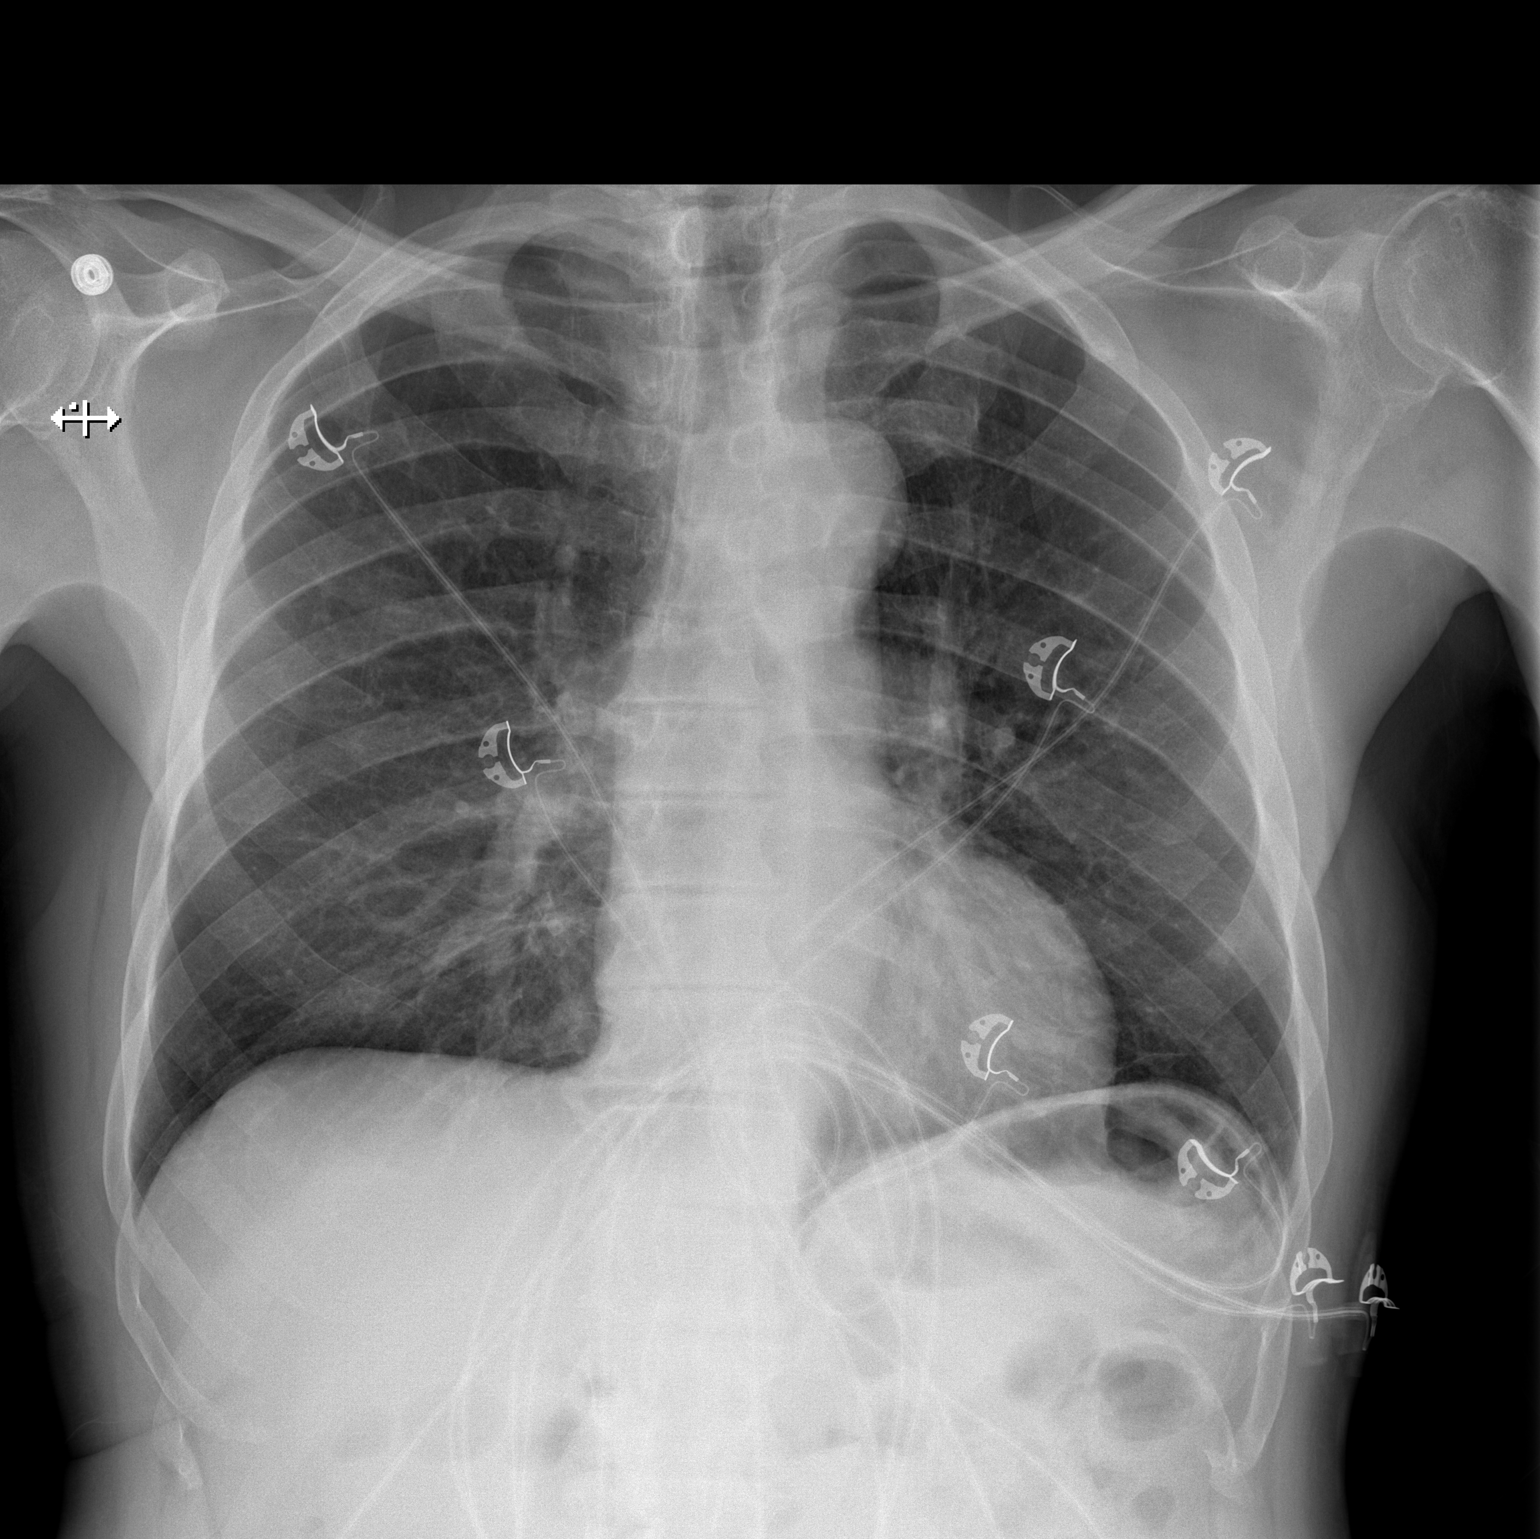

[w chest lat]
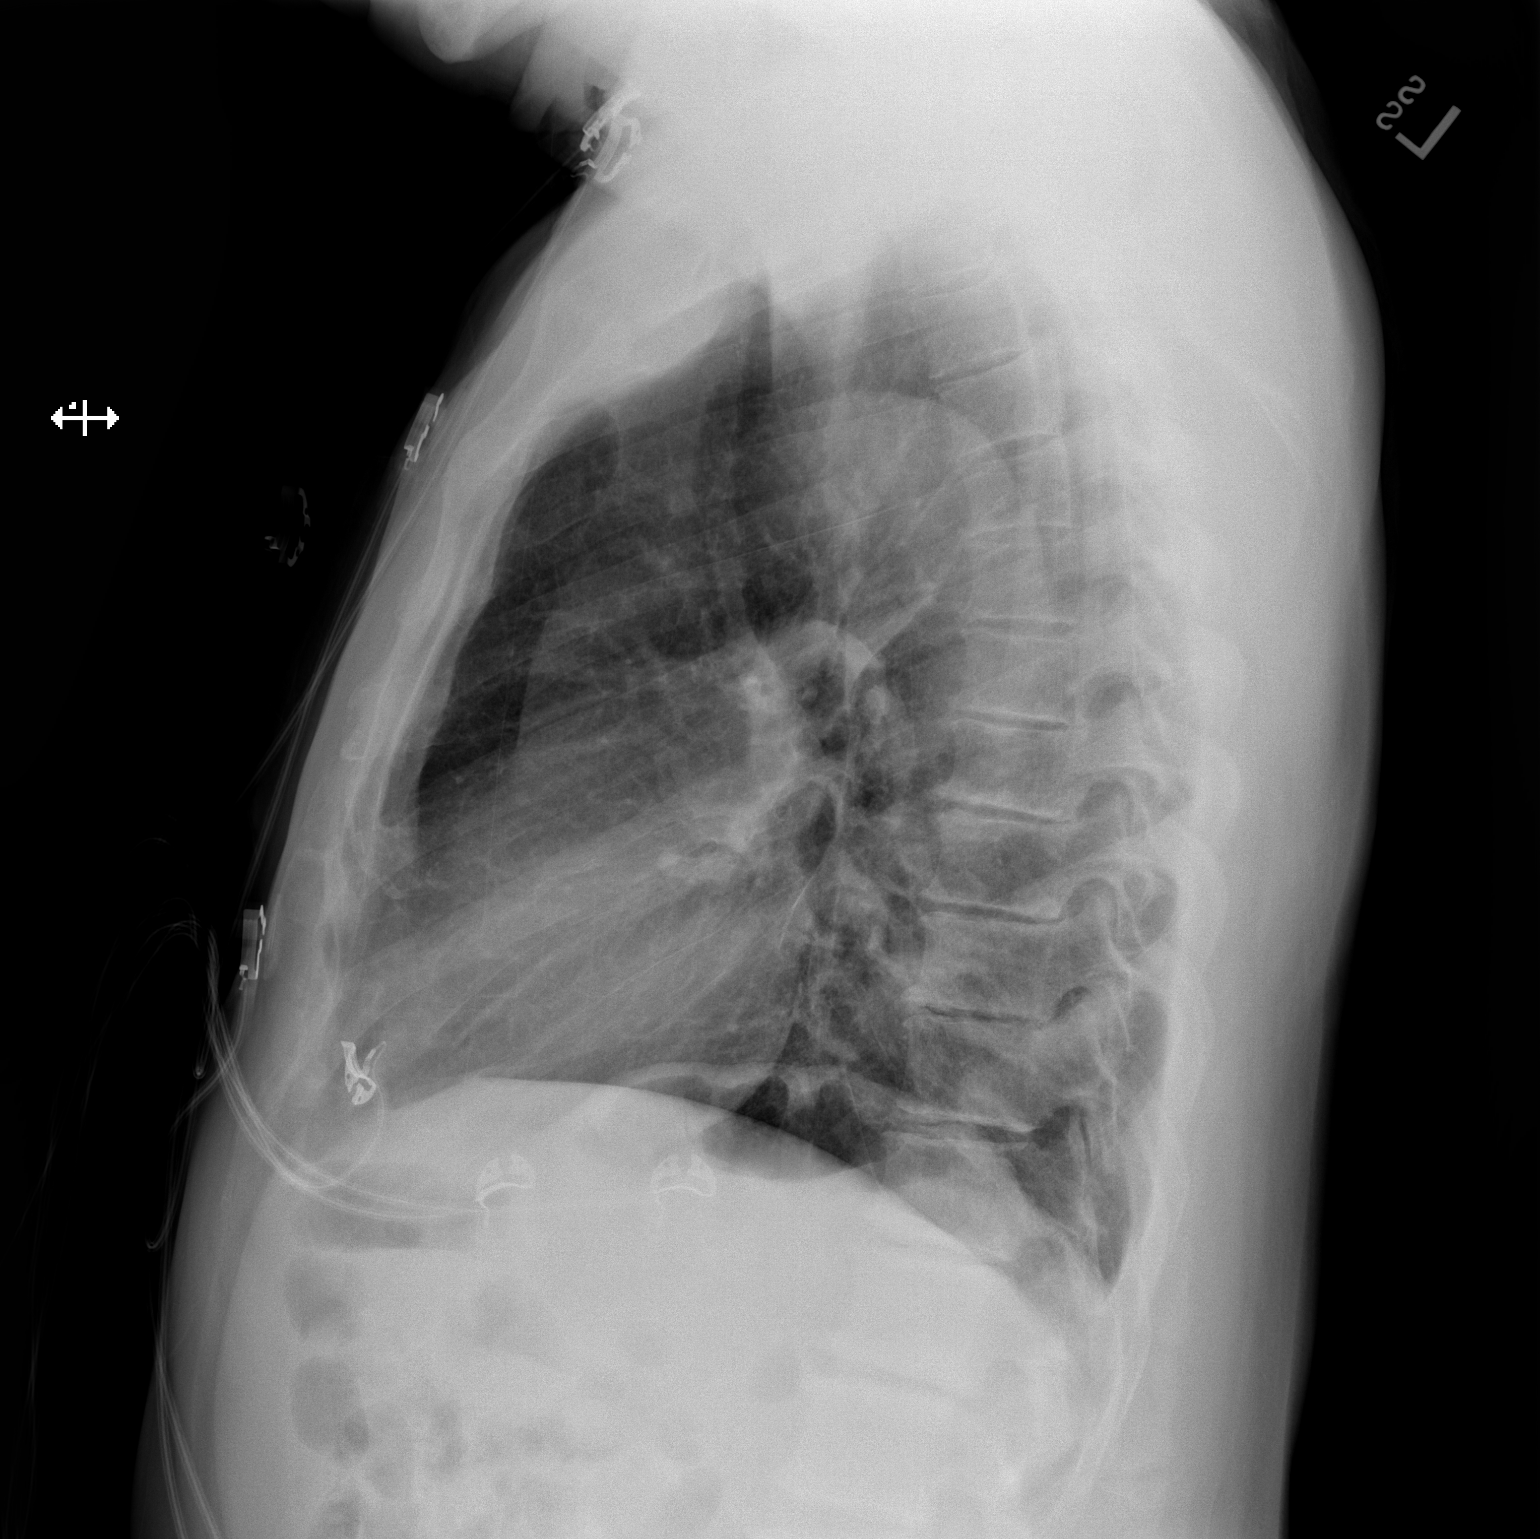

[2 of 2 positions shown; findings below may reference images not displayed]

FINDINGS: The lungs are clear. There is no pleural effusion or pneumothorax.
The cardiac silhouette is within normal limits. No acute osseous
pathology.
IMPRESSION: No active cardiopulmonary disease.

## 2017-10-22 ENCOUNTER — Telehealth: Payer: Self-pay | Admitting: *Deleted

## 2017-10-22 NOTE — Telephone Encounter (Signed)
Voicemail: "This is Juan [name] calling to give my new phone number [phone]) for you all to call me with appointments.       Reached patient confirming date of birth.  "I've been away a good while.  Hip replacement in January.  Recently released to return to work. New number, [phone] is the only number I have.  I turned the other phones off so yes; remove those."    Provided next scheduled lab/F/U appointments on 12-27-2017 12:30 pm/1:00 pm with this call.  Updated demographics.

## 2017-10-25 ENCOUNTER — Telehealth: Payer: Self-pay | Admitting: *Deleted

## 2017-10-25 NOTE — Telephone Encounter (Signed)
"  I missed a call after 8:00 am.  I can be reached 3:30 to 4:30 pm when I get off work."

## 2017-10-28 ENCOUNTER — Encounter: Payer: Self-pay | Admitting: Cardiothoracic Surgery

## 2017-11-22 DIAGNOSIS — F1190 Opioid use, unspecified, uncomplicated: Secondary | ICD-10-CM | POA: Diagnosis not present

## 2017-11-22 DIAGNOSIS — E1165 Type 2 diabetes mellitus with hyperglycemia: Secondary | ICD-10-CM | POA: Diagnosis not present

## 2017-11-22 DIAGNOSIS — E782 Mixed hyperlipidemia: Secondary | ICD-10-CM | POA: Diagnosis not present

## 2017-11-25 DIAGNOSIS — G609 Hereditary and idiopathic neuropathy, unspecified: Secondary | ICD-10-CM | POA: Diagnosis not present

## 2017-11-25 DIAGNOSIS — E1165 Type 2 diabetes mellitus with hyperglycemia: Secondary | ICD-10-CM | POA: Diagnosis not present

## 2017-11-25 DIAGNOSIS — Z23 Encounter for immunization: Secondary | ICD-10-CM | POA: Diagnosis not present

## 2017-11-25 DIAGNOSIS — E782 Mixed hyperlipidemia: Secondary | ICD-10-CM | POA: Diagnosis not present

## 2017-11-25 DIAGNOSIS — F1190 Opioid use, unspecified, uncomplicated: Secondary | ICD-10-CM | POA: Diagnosis not present

## 2017-12-26 NOTE — Progress Notes (Addendum)
[facility]  [phone] [phone]  Clinic Follow up Note   Patient Care Team: [place], [name]), MD (Inactive) as PCP - General (Internal Medicine) [name], MD as Consulting Physician (Hematology) Kyung Rudd, MD as Consulting Physician (Radiation Oncology) Juanita Craver, MD as Consulting Physician (Gastroenterology) 12/27/2017  SUMMARY OF ONCOLOGIC HISTORY: Oncology History   Presented with dysphagia and odynophagia with 20-25 pounds of weight loss in about 3-4 months  Esophageal cancer [facility])   Staging form: Esophagus - Adenocarcinoma, AJCC 7th Edition   - Clinical stage from 11/13/2015: Stage IIIB (T3, N2, M0, G2) - Signed by [name], MD on 12/11/2015      Esophageal cancer ([place])   11/13/2015 Procedure    UPPER ENDOSCOPY per Dr. Collene Mares: Large fungating, friable bleeding mass in middle third of esophagus, 22cm from incisors and extended to 27cm. Non-obstructing.    11/13/2015 Pathology Results    Invasive squamous cell carcinoma; moderately differentiated    11/13/2015 Imaging    CT ABD/PELVIS: IMPRESSION:No evidence of metastatic disease in the abdomen or pelvis. 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 Aortoiliac atherosclerosis. Small bilateral inguinal hernias containing fat the    11/22/2015 Initial Diagnosis    Esophageal cancer ([place])    12/03/2015 Imaging    PET scan showed a long segment of hypermetabolic thickening in the mid esophagus consistent with esophageal carcinoma. No clear evidence of node metastasis or distant metastasis.    12/10/2015 - 01/14/2016 Radiation Therapy    Neoadjuvant radiation to his esophageal cancer    12/10/2015 - 01/15/2016 Chemotherapy    Neoadjuvant weekly carboplatin AUC 2, and Taxol 45 mg/m, with concurrent radiation. He developed steroid-induced psychosis, and Taxol was changed to Abraxane to avoid premedication with steroids.    02/24/2016 Imaging    CT CAP with contrast showed  interval improvement mid esophageal mass, no evidence for metastatic adenopathy or distant metastasis.     03/10/2016 Procedure    Repeat EGD by Dr. Benson Norway showed wall thickening in the thoracic esophagus with the tumor was. The thickness decreased from 12.8 mm to 6-7 mm. Not able to differentiate between actual tumor versus fibrosis from radiation. Some peritumoral shoddy lymph nodes.    04/07/2016 Surgery    Patient followed up with Dr. Servando Snare, patient is not a great candidate for surgery, pt also does not want surgery, mutually agreed to not pursue esophagectomy.     06/01/2016 Imaging    CT C/A/P IMPRESSION: Mild mid esophageal wall thickening without residual mass on CT. Radiation changes in the paramediastinal lung bilaterally. No evidence of recurrent or metastatic disease.    08/18/2016 -  Hospital Admission    Patient presented to the ER with SOB and complaints of chest pain    11/07/2016 Imaging    Nm Pet Image Restag IMPRESSION: Negative PET-CT. No findings for recurrent esophageal cancer or metastatic disease.    01/07/2017 Procedure    EUS by Dr. Benson Norway 01/07/17 IMPRESSION - Normal esophagus. - Normal stomach. - Normal examined duodenum. - Wall thickening was seen in the upper third of the esophagus and in the middle third of the esophagus. - One benign lymph node was visualized in the middle paraesophageal mediastinum (level 46M). - No specimens collected.    05/10/2017 Imaging    CT CAP W Contrast 05/10/17 IMPRESSION: 1. No new or progressive findings to suggest recurrent or metastatic disease in the chest or abdomen on today's study. 2.  Aortic Atherosclerois (ICD10-170.0)   CURRENT THERAPY:  observation   INTERVAL HISTORY: Mr. Musselman returns for lab and follow up as scheduled. He was last seen by Dr. Burr Medico in 07/2017 and Dr. Servando Snare in 08/2017. He has been doing well in the interim. 2 weeks ago he developed mild difficulty and pain on swallowing and food getting stuck in  back of his throat. He continues to tolerate solids and liquids. Takes protonix for GERD. He has not lost weight. Appetite remains normal. 1 month ago he had a cold with cough, sneezing, and rhinorrhea. He has some pain in the low chest which may be from coughing. He also notes sensitivity in the left breast. Denies fever, chills, chest pain, or dyspnea. He restarted working, has mild fatigue but able to function completely.    MEDICAL HISTORY:  Past Medical History:  Diagnosis Date  . Aortic atherosclerosis ([place])   . Back pain   . Barrett esophagus   . Bleeding nose   . DDD (degenerative disc disease), thoracolumbar    Moderate to severe  . Diabetes mellitus without complication ([place])   . Diverticulosis   . Esophageal cancer ([place]) dx'd 2017  . GERD (gastroesophageal reflux disease)   . Headache   . Hearing loss    Right ear  . History of umbilical hernia repair   . Idiopathic peripheral neuropathy   . Iron deficiency anemia   . LBBB (left bundle branch block) 02/11/2016  . Left hip pain    Severe degenerative changes  . Lipoma of neck    Right  . Mixed hyperlipidemia   . PONV (postoperative nausea and vomiting)   . Reflux   . Right thyroid nodule     SURGICAL HISTORY: Past Surgical History:  Procedure Laterality Date  . COLONOSCOPY    . EUS N/A 12/05/2015   Procedure: UPPER ENDOSCOPIC ULTRASOUND (EUS) LINEAR;  Surgeon: Carol Ada, MD;  Location: [facility];  Service: Endoscopy;  Laterality: N/A;  . EUS N/A 03/10/2016   Procedure: UPPER ENDOSCOPIC ULTRASOUND (EUS) LINEAR;  Surgeon: Carol Ada, MD;  Location: [facility];  Service: Endoscopy;  Laterality: N/A;  . EUS N/A 01/07/2017   Procedure: UPPER ENDOSCOPIC ULTRASOUND (EUS) LINEAR;  Surgeon: Carol Ada, MD;  Location: [facility];  Service: Endoscopy;  Laterality: N/A;  . HERNIA REPAIR    . IR GENERIC HISTORICAL  12/24/2015   IR FLUORO GUIDE PORT INSERTION RIGHT 12/24/2015 Arne Cleveland, MD [facility]INTER[facility]  . IR  GENERIC HISTORICAL  12/24/2015   IR US GUIDE VASC ACCESS RIGHT 12/24/2015 Arne Cleveland, MD [facility]  . PORTA CATH INSERTION    . SHOULDER ARTHROSCOPY W/ SUPERIOR LABRAL ANTERIOR POSTERIOR LESION REPAIR     times 2  . TOTAL HIP ARTHROPLASTY Left 07/22/2017   Procedure: LEFT TOTAL HIP ARTHROPLASTY ANTERIOR APPROACH;  Surgeon: Rod Can, MD;  Location: [facility];  Service: Orthopedics;  Laterality: Left;  . UPPER GI ENDOSCOPY  01/07/2017    I have reviewed the social history and family history with the patient and they are unchanged from previous note.  ALLERGIES:  has No Known Allergies.  MEDICATIONS:  Current Outpatient Medications  Medication Sig Dispense Refill  . alfuzosin (UROXATRAL) 10 MG 24 hr tablet Take 10 mg by mouth daily with breakfast.    . apixaban (ELIQUIS) 2.5 MG TABS tablet Take 1 tablet (2.5 mg total) by mouth every 12 (twelve) hours. 60 tablet 0  . atorvastatin (LIPITOR) 20 MG tablet Take 20 mg by mouth at bedtime.     . docusate sodium (COLACE)  100 MG capsule Take 1 capsule (100 mg total) by mouth 2 (two) times daily. 10 capsule 0  . Fluticasone Furoate 50 MCG/ACT AEPB Inhale into the lungs.    . gabapentin (NEURONTIN) 300 MG capsule Take 300 mg by mouth 2 (two) times daily.    Marland Kitchen glimepiride (AMARYL) 1 MG tablet Take 1 mg by mouth daily with breakfast.    . ondansetron (ZOFRAN) 4 MG tablet Take 1 tablet (4 mg total) by mouth every 6 (six) hours as needed for nausea. 20 tablet 0  . oxyCODONE 10 MG TABS Take 1-1.5 tablets (10-15 mg total) by mouth every 4 (four) hours as needed for severe pain. 50 tablet 0  . pantoprazole (PROTONIX) 40 MG tablet Take 1 tablet (40 mg total) by mouth daily. 30 tablet 0  . prochlorperazine (COMPAZINE) 10 MG tablet Take 10 mg by mouth every 6 (six) hours as needed for nausea or vomiting.    . sodium chloride (OCEAN) 0.65 % SOLN nasal spray Place 1 spray into both nostrils as needed for congestion. 30 mL 2  . tiZANidine (ZANAFLEX) 2 MG  tablet Take by mouth every 8 (eight) hours as needed for muscle spasms.    Marland Kitchen amitriptyline (ELAVIL) 10 MG tablet Take 10 mg by mouth at bedtime.      No current facility-administered medications for this visit.     PHYSICAL EXAMINATION: ECOG PERFORMANCE STATUS: 1 - Symptomatic but completely ambulatory  Vitals:   12/27/17 1259  BP: (!) 169/87  Pulse: 65  Resp: 19  Temp: 98.6 F (37 C)  SpO2: 99%   Filed Weights   12/27/17 1259  Weight: 202 lb 9.6 oz (91.9 kg)    GENERAL:alert, no distress and comfortable SKIN: no rashes or significant lesions EYES: sclera clear OROPHARYNX:no thrush or ulcers  LYMPH:  Palpable 2-2.5 cm non-tender right anterior cervical LN; no supraclavicular or axillary adenopathy  LUNGS: clear to auscultation with normal breathing effort HEART: regular rate & rhythm and no lower extremity edema ABDOMEN:abdomen soft, non-tender and normal bowel sounds Musculoskeletal:no cyanosis of digits and no clubbing  NEURO: alert & oriented x 3 with fluent speech, no focal motor/sensory deficits Breast exam performed by Dr. Burr Medico without palpable mass  PAC without erythema    LABORATORY DATA:  I have reviewed the data as listed CBC Latest Ref Rng & Units 12/27/2017 08/27/2017 07/24/2017  WBC 4.0 - 10.3 K/uL 4.1 3.2(L) 6.4  Hemoglobin 13.0 - 17.1 g/dL 11.6(L) 11.0(L) 9.7(L)  Hematocrit 38.4 - 49.9 % 37.5(L) 36.2(L) 31.0(L)  Platelets 140 - 400 K/uL 236 257 172     CMP Latest Ref Rng & Units 12/27/2017 08/27/2017 07/23/2017  Glucose 70 - 99 mg/dL 90 92 126(H)  BUN 8 - 23 mg/dL 6(L) 11 12  Creatinine 0.61 - 1.24 mg/dL 0.94 0.93 0.86  Sodium 135 - 145 mmol/L 143 138 137  Potassium 3.5 - 5.1 mmol/L 4.2 4.2 4.1  Chloride 98 - 111 mmol/L 107 104 106  CO2 22 - 32 mmol/L 28 24 21(L)  Calcium 8.9 - 10.3 mg/dL 9.2 9.3 8.2(L)  Total Protein 6.5 - 8.1 g/dL 7.5 7.9 -  Total Bilirubin 0.3 - 1.2 mg/dL 0.4 0.5 -  Alkaline Phos 38 - 126 U/L 79 85 -  AST 15 - 41 U/L 19 13 -  ALT  0 - 44 U/L 14 8 -      RADIOGRAPHIC STUDIES: I have personally reviewed the radiological images as listed and agreed with the findings in the  report. No results found.   ASSESSMENT & PLAN: 77 y.o.  gentleman, without significant past medical history except acid reflux, presented with progressive dysphagia and odynophagia and weight loss.  1. 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 He completed chemoRT from 11/2015 to 12/2015, has had no evidence of recurrence on previous surveillance scans. His dysphagia resolved after chemoRT.  -He has 2 week h/o dysphagia and throat pain with solids; he is able to tolerate liquids. Physical exam reveals palpable right cervical LN. He had a cold 1 month ago. Labs are stable.  -will obtain PET to r/o recurrence, and refer him back to Dr. Benson Norway, I sent him a message today  -If LN is PET positive, would recommend tissue biopsy, will see him back in f/u in 2 weeks to reviewe results -The patient was seen with Dr. Burr Medico who recommends the plan, the patient agrees.   2. Dysphagia, resolved after chemoradiation  -He initially presented with dysphagia and throat pain, not able to tolerate solids. Dysphagia resolved after chemoRT.  -He has 2 week h/o mild dysphagia and throat pain, he remains able to eat normal diet. Will obtain PET to r/o recurrence   3. History of steroids induced psychosis -Resolved now.   4. Leukopenia and anemia -Secondary to chemotherapy radiation; leukopenia resolved, anemia remains mild and stable, Hgb 11.6  5. Chronic back pain, left hip pain  -PCP refills narcotics; he restarted working; pain is overall improved   PLAN:  -Labs reviewed  -PET in 1 week to r/o recurrence, possible biopsy  -Lab, flush, f/u in 2-3 weeks to review results  -Refer back to Dr. Benson Norway for upper endoscopy, I sent him a message today    Orders Placed  This Encounter  Procedures  . NM PET Image Restag (PS) Skull Base To Thigh    Standing Status:   Future    Standing Expiration Date:   12/27/2018    Order Specific Question:   ** REASON FOR EXAM (FREE TEXT)    Answer:   h/o esophageal cancer s/p chemoRT in 2017 with 2 week h/o dysphagia and enlarged right cervical LN    Order Specific Question:   If indicated for the ordered procedure, I authorize the administration of a radiopharmaceutical per Radiology protocol    Answer:   Yes    Order Specific Question:   Preferred imaging location?    Answer:   [facility]    Order Specific Question:   Radiology Contrast Protocol - do NOT remove file path    Answer:   [email or web address]   All questions were answered. The patient knows to call the clinic with any problems, questions or concerns. No barriers to learning was detected.     Alla Feeling, NP 12/27/17    Addendum  I have seen the patient, examined him. I agree with the assessment and and plan and have edited the notes.   Jhonnie has a new and enlarged right cervical node, he has also developed some dysphagia.  I recommend a PET scan to rule out recurrence, and also encouraged him to return to Dr. Benson Norway for endoscopy. I will see him back after the above work up.  He previously had a clinical complete response to chemo and radiation, but he did not have definitive surgery esophagectomy, he is at very high risk for recurrence.  He voiced good understanding and agrees with the plan.  [name]  12/27/2017

## 2017-12-27 ENCOUNTER — Telehealth: Payer: Self-pay | Admitting: Nurse Practitioner

## 2017-12-27 ENCOUNTER — Encounter: Payer: Self-pay | Admitting: Nurse Practitioner

## 2017-12-27 ENCOUNTER — Inpatient Hospital Stay (HOSPITAL_BASED_OUTPATIENT_CLINIC_OR_DEPARTMENT_OTHER): Payer: 59 | Admitting: Nurse Practitioner

## 2017-12-27 ENCOUNTER — Inpatient Hospital Stay: Payer: 59 | Attending: Nurse Practitioner

## 2017-12-27 VITALS — BP 169/87 | HR 65 | Temp 98.6°F | Resp 19 | Ht 69.0 in | Wt 202.6 lb

## 2017-12-27 DIAGNOSIS — Z8501 Personal history of malignant neoplasm of esophagus: Secondary | ICD-10-CM

## 2017-12-27 DIAGNOSIS — M549 Dorsalgia, unspecified: Secondary | ICD-10-CM | POA: Insufficient documentation

## 2017-12-27 DIAGNOSIS — D6481 Anemia due to antineoplastic chemotherapy: Secondary | ICD-10-CM

## 2017-12-27 DIAGNOSIS — G8929 Other chronic pain: Secondary | ICD-10-CM

## 2017-12-27 DIAGNOSIS — M25552 Pain in left hip: Secondary | ICD-10-CM | POA: Insufficient documentation

## 2017-12-27 DIAGNOSIS — K219 Gastro-esophageal reflux disease without esophagitis: Secondary | ICD-10-CM

## 2017-12-27 DIAGNOSIS — C154 Malignant neoplasm of middle third of esophagus: Secondary | ICD-10-CM

## 2017-12-27 DIAGNOSIS — D490 Neoplasm of unspecified behavior of digestive system: Secondary | ICD-10-CM

## 2017-12-27 LAB — COMPREHENSIVE METABOLIC PANEL
ALT: 14 U/L (ref 0–44)
AST: 19 U/L (ref 15–41)
Albumin: 4 g/dL (ref 3.5–5.0)
Alkaline Phosphatase: 79 U/L (ref 38–126)
Anion gap: 8 (ref 5–15)
BUN: 6 mg/dL — ABNORMAL LOW (ref 8–23)
CO2: 28 mmol/L (ref 22–32)
Calcium: 9.2 mg/dL (ref 8.9–10.3)
Chloride: 107 mmol/L (ref 98–111)
Creatinine, Ser: 0.94 mg/dL (ref 0.61–1.24)
GFR calc Af Amer: >60 mL/min (ref >60)
GFR calc non Af Amer: >60 mL/min (ref >60)
Glucose, Bld: 90 mg/dL (ref 70–99)
Potassium: 4.2 mmol/L (ref 3.5–5.1)
Sodium: 143 mmol/L (ref 135–145)
Total Bilirubin: 0.4 mg/dL (ref 0.3–1.2)
Total Protein: 7.5 g/dL (ref 6.5–8.1)

## 2017-12-27 LAB — CBC WITH DIFFERENTIAL/PLATELET
Basophils Absolute: 0 10*3/uL (ref 0.0–0.1)
Basophils Relative: 1 %
Eosinophils Absolute: 0.1 10*3/uL (ref 0.0–0.5)
Eosinophils Relative: 3 %
HCT: 37.5 % — ABNORMAL LOW (ref 38.4–49.9)
Hemoglobin: 11.6 g/dL — ABNORMAL LOW (ref 13.0–17.1)
Lymphocytes Relative: 35 %
Lymphs Abs: 1.5 10*3/uL (ref 0.9–3.3)
MCH: 22.3 pg — ABNORMAL LOW (ref 27.2–33.4)
MCHC: 30.9 g/dL — ABNORMAL LOW (ref 32.0–36.0)
MCV: 72.2 fL — ABNORMAL LOW (ref 79.3–98.0)
Monocytes Absolute: 0.4 10*3/uL (ref 0.1–0.9)
Monocytes Relative: 10 %
Neutro Abs: 2.1 10*3/uL (ref 1.5–6.5)
Neutrophils Relative %: 51 %
Platelets: 236 10*3/uL (ref 140–400)
RBC: 5.19 MIL/uL (ref 4.20–5.82)
RDW: 17.4 % — ABNORMAL HIGH (ref 11.0–14.6)
WBC: 4.1 10*3/uL (ref 4.0–10.3)

## 2017-12-27 NOTE — Telephone Encounter (Signed)
Gave pt avs and calendar  °

## 2017-12-30 ENCOUNTER — Telehealth: Payer: Self-pay | Admitting: *Deleted

## 2017-12-30 NOTE — Telephone Encounter (Signed)
Medical records faxed to [facility]; release [phone]

## 2017-12-31 ENCOUNTER — Encounter (HOSPITAL_COMMUNITY): Payer: 59

## 2018-01-06 ENCOUNTER — Other Ambulatory Visit: Payer: Self-pay | Admitting: Nurse Practitioner

## 2018-01-06 DIAGNOSIS — D490 Neoplasm of unspecified behavior of digestive system: Secondary | ICD-10-CM

## 2018-01-07 ENCOUNTER — Encounter (HOSPITAL_COMMUNITY): Payer: 59

## 2018-01-08 ENCOUNTER — Other Ambulatory Visit: Payer: Self-pay | Admitting: Nurse Practitioner

## 2018-01-12 ENCOUNTER — Ambulatory Visit (HOSPITAL_COMMUNITY): Payer: 59

## 2018-01-13 ENCOUNTER — Inpatient Hospital Stay: Payer: 59 | Attending: Nurse Practitioner

## 2018-01-13 ENCOUNTER — Telehealth: Payer: Self-pay | Admitting: Hematology

## 2018-01-13 ENCOUNTER — Inpatient Hospital Stay: Payer: 59 | Admitting: Hematology

## 2018-01-13 ENCOUNTER — Inpatient Hospital Stay: Payer: 59

## 2018-01-13 DIAGNOSIS — Z8501 Personal history of malignant neoplasm of esophagus: Secondary | ICD-10-CM | POA: Diagnosis not present

## 2018-01-13 DIAGNOSIS — D6481 Anemia due to antineoplastic chemotherapy: Secondary | ICD-10-CM | POA: Diagnosis not present

## 2018-01-13 DIAGNOSIS — D72818 Other decreased white blood cell count: Secondary | ICD-10-CM | POA: Insufficient documentation

## 2018-01-13 DIAGNOSIS — Z23 Encounter for immunization: Secondary | ICD-10-CM | POA: Insufficient documentation

## 2018-01-13 DIAGNOSIS — C154 Malignant neoplasm of middle third of esophagus: Secondary | ICD-10-CM

## 2018-01-13 LAB — CBC WITH DIFFERENTIAL/PLATELET
Abs Immature Granulocytes: 0.01 10*3/uL (ref 0.00–0.07)
Basophils Absolute: 0 10*3/uL (ref 0.0–0.1)
Basophils Relative: 1 %
Eosinophils Absolute: 0.1 10*3/uL (ref 0.0–0.5)
Eosinophils Relative: 4 %
HCT: 38.1 % — ABNORMAL LOW (ref 39.0–52.0)
Hemoglobin: 11.5 g/dL — ABNORMAL LOW (ref 13.0–17.0)
Immature Granulocytes: 0 %
Lymphocytes Relative: 36 %
Lymphs Abs: 1.4 10*3/uL (ref 0.7–4.0)
MCH: 22 pg — ABNORMAL LOW (ref 26.0–34.0)
MCHC: 30.2 g/dL (ref 30.0–36.0)
MCV: 73 fL — ABNORMAL LOW (ref 80.0–100.0)
Monocytes Absolute: 0.4 10*3/uL (ref 0.1–1.0)
Monocytes Relative: 11 %
Neutro Abs: 1.9 10*3/uL (ref 1.7–7.7)
Neutrophils Relative %: 48 %
Platelets: 262 10*3/uL (ref 150–400)
RBC: 5.22 MIL/uL (ref 4.22–5.81)
RDW: 17.1 % — ABNORMAL HIGH (ref 11.5–15.5)
WBC: 3.9 10*3/uL — ABNORMAL LOW (ref 4.0–10.5)
nRBC: 0 % (ref 0.0–0.2)

## 2018-01-13 LAB — COMPREHENSIVE METABOLIC PANEL
ALT: 9 U/L (ref 0–44)
AST: 18 U/L (ref 15–41)
Albumin: 3.7 g/dL (ref 3.5–5.0)
Alkaline Phosphatase: 91 U/L (ref 38–126)
Anion gap: 9 (ref 5–15)
BUN: 10 mg/dL (ref 8–23)
CO2: 26 mmol/L (ref 22–32)
Calcium: 9.1 mg/dL (ref 8.9–10.3)
Chloride: 109 mmol/L (ref 98–111)
Creatinine, Ser: 0.95 mg/dL (ref 0.61–1.24)
GFR calc Af Amer: >60 mL/min (ref >60)
GFR calc non Af Amer: >60 mL/min (ref >60)
Glucose, Bld: 66 mg/dL — ABNORMAL LOW (ref 70–99)
Potassium: 4.2 mmol/L (ref 3.5–5.1)
Sodium: 144 mmol/L (ref 135–145)
Total Bilirubin: 0.4 mg/dL (ref 0.3–1.2)
Total Protein: 7.1 g/dL (ref 6.5–8.1)

## 2018-01-13 NOTE — Telephone Encounter (Signed)
Appts added to schedule LMVM for patient with date/time per 10/17 sch msg

## 2018-01-13 NOTE — Progress Notes (Unsigned)
Patient had appointment to have his labs drawn through his [place] a cath today.Upon reviewing tip placement it was noted on 05/10/17 under his last chest CT Port-A-Cath tip is in the distal esophagus. Doctor was notified and patient was sent to lab to have his labs drawn peripherally.

## 2018-01-14 ENCOUNTER — Ambulatory Visit (HOSPITAL_COMMUNITY)
Admission: RE | Admit: 2018-01-14 | Discharge: 2018-01-14 | Disposition: A | Discharge status: Home / Self Care | Payer: 59 | Source: Ambulatory Visit | Attending: Nurse Practitioner | Admitting: Nurse Practitioner

## 2018-01-14 ENCOUNTER — Encounter (HOSPITAL_COMMUNITY): Payer: Self-pay

## 2018-01-14 DIAGNOSIS — I700 Atherosclerosis of aorta: Secondary | ICD-10-CM | POA: Diagnosis not present

## 2018-01-14 DIAGNOSIS — C159 Malignant neoplasm of esophagus, unspecified: Secondary | ICD-10-CM | POA: Diagnosis not present

## 2018-01-14 DIAGNOSIS — D490 Neoplasm of unspecified behavior of digestive system: Secondary | ICD-10-CM | POA: Insufficient documentation

## 2018-01-14 DIAGNOSIS — J439 Emphysema, unspecified: Secondary | ICD-10-CM | POA: Diagnosis not present

## 2018-01-14 DIAGNOSIS — R590 Localized enlarged lymph nodes: Secondary | ICD-10-CM | POA: Diagnosis not present

## 2018-01-14 MED ORDER — HEPARIN SOD (PORK) LOCK FLUSH 100 UNIT/ML IV SOLN
500.0000 [IU] | Freq: Once | INTRAVENOUS | Status: AC
  Administered 2018-01-14: 500 [IU]

## 2018-01-14 MED ORDER — SODIUM CHLORIDE 0.9 % IJ SOLN
INTRAMUSCULAR | Status: AC
  Filled 2018-01-14: qty 50

## 2018-01-14 MED ORDER — HEPARIN SOD (PORK) LOCK FLUSH 100 UNIT/ML IV SOLN
INTRAVENOUS | Status: AC
  Filled 2018-01-14: qty 5

## 2018-01-14 MED ORDER — IOHEXOL 300 MG/ML  SOLN
100.0000 mL | Freq: Once | INTRAMUSCULAR | Status: AC | PRN
  Administered 2018-01-14: 100 mL via INTRAVENOUS

## 2018-01-17 ENCOUNTER — Inpatient Hospital Stay: Payer: 59 | Admitting: Hematology

## 2018-01-17 ENCOUNTER — Telehealth: Payer: Self-pay | Admitting: Hematology

## 2018-01-17 ENCOUNTER — Inpatient Hospital Stay (HOSPITAL_BASED_OUTPATIENT_CLINIC_OR_DEPARTMENT_OTHER): Payer: 59 | Admitting: Hematology

## 2018-01-17 VITALS — BP 136/86 | HR 62 | Temp 98.7°F | Resp 17 | Ht 69.0 in | Wt 204.5 lb

## 2018-01-17 DIAGNOSIS — D6481 Anemia due to antineoplastic chemotherapy: Secondary | ICD-10-CM | POA: Diagnosis not present

## 2018-01-17 DIAGNOSIS — D72818 Other decreased white blood cell count: Secondary | ICD-10-CM

## 2018-01-17 DIAGNOSIS — Z23 Encounter for immunization: Secondary | ICD-10-CM | POA: Diagnosis not present

## 2018-01-17 DIAGNOSIS — Z8501 Personal history of malignant neoplasm of esophagus: Secondary | ICD-10-CM | POA: Diagnosis not present

## 2018-01-17 DIAGNOSIS — C154 Malignant neoplasm of middle third of esophagus: Secondary | ICD-10-CM

## 2018-01-17 MED ORDER — INFLUENZA VAC SPLIT QUAD 0.5 ML IM SUSY
0.5000 mL | PREFILLED_SYRINGE | Freq: Once | INTRAMUSCULAR | Status: AC
  Administered 2018-01-17: 0.5 mL via INTRAMUSCULAR

## 2018-01-17 MED ORDER — INFLUENZA VAC SPLIT QUAD 0.5 ML IM SUSY
PREFILLED_SYRINGE | INTRAMUSCULAR | Status: AC
  Filled 2018-01-17: qty 0.5

## 2018-01-17 NOTE — Telephone Encounter (Signed)
Scheduled appt per 10/21 los- gave patient AVS and calender per los.   

## 2018-01-17 NOTE — Progress Notes (Signed)
[place]  [phone]) [phone] Fax:([phone]) [phone]  Clinic follow Up Note   Patient Care Team: [place], [place] Merrilee Seashore), MD (Inactive) as PCP - General (Internal Medicine) [name], MD as Consulting Physician (Hematology) Kyung Rudd, MD as Consulting Physician (Radiation Oncology) Juanita Craver, MD as Consulting Physician (Gastroenterology)  Date of Service:  01/17/2018   CHIEF COMPLAINTS:  Follow up esophageal squamous cell carcinoma  Oncology History   Presented with dysphagia and odynophagia with 20-25 pounds of weight loss in about 3-4 months  Esophageal cancer ([place])   Staging form: Esophagus - Adenocarcinoma, AJCC 7th Edition   - Clinical stage from 11/13/2015: Stage IIIB (T3, N2, M0, G2) - Signed by [name], MD on 12/11/2015      Esophageal cancer ([place])   11/13/2015 Procedure    UPPER ENDOSCOPY per Dr. Collene Mares: Large fungating, friable bleeding mass in middle third of esophagus, 22cm from incisors and extended to 27cm. Non-obstructing.    11/13/2015 Pathology Results    Invasive squamous cell carcinoma; moderately differentiated    11/13/2015 Imaging    CT ABD/PELVIS: IMPRESSION:No evidence of metastatic disease in the abdomen or pelvis. 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 Aortoiliac atherosclerosis. Small bilateral inguinal hernias containing fat the    11/22/2015 Initial Diagnosis    Esophageal cancer ([place])    12/03/2015 Imaging    PET scan showed a long segment of hypermetabolic thickening in the mid esophagus consistent with esophageal carcinoma. No clear evidence of node metastasis or distant metastasis.    12/10/2015 - 01/14/2016 Radiation Therapy    Neoadjuvant radiation to his esophageal cancer    12/10/2015 - 01/15/2016 Chemotherapy    Neoadjuvant weekly carboplatin AUC 2, and Taxol 45 mg/m, with concurrent radiation. He developed steroid-induced psychosis, and Taxol was changed to Abraxane to avoid premedication with steroids.    02/24/2016 Imaging    CT CAP with contrast showed interval improvement mid esophageal mass, no evidence for metastatic adenopathy or distant metastasis.     03/10/2016 Procedure    Repeat EGD by Dr. Benson Norway showed wall thickening in the thoracic esophagus with the tumor was. The thickness decreased from 12.8 mm to 6-7 mm. Not able to differentiate between actual tumor versus fibrosis from radiation. Some peritumoral shoddy lymph nodes.    04/07/2016 Surgery    Patient followed up with Dr. Servando Snare, patient is not a great candidate for surgery, pt also does not want surgery, mutually agreed to not pursue esophagectomy.     06/01/2016 Imaging    CT C/A/P IMPRESSION: Mild mid esophageal wall thickening without residual mass on CT. Radiation changes in the paramediastinal lung bilaterally. No evidence of recurrent or metastatic disease.    08/18/2016 -  Hospital Admission    Patient presented to the ER with SOB and complaints of chest pain    11/07/2016 Imaging    Nm Pet Image Restag IMPRESSION: Negative PET-CT. No findings for recurrent esophageal cancer or metastatic disease.    01/07/2017 Procedure    EUS by Dr. Benson Norway 01/07/17 IMPRESSION - Normal esophagus. - Normal stomach. - Normal examined duodenum. - Wall thickening was seen in the upper third of the esophagus and in the middle third of the esophagus. - One benign lymph node was visualized in the middle paraesophageal mediastinum (level 24M). - No specimens collected.    05/10/2017 Imaging    CT CAP W Contrast 05/10/17 IMPRESSION: 1. No new or progressive findings to suggest recurrent or metastatic disease in the chest or abdomen on  today's study. 2.  Aortic Atherosclerois (ICD10-170.0)    01/14/2018 Imaging    01/14/2018 CT Neck IMPRESSION: 1. No evidence of cervical lymphadenopathy. 2. 5 cm right neck lipoma, mildly enlarged from 2010.    01/14/2018 Imaging    01/14/2018 CT CAP IMPRESSION: 1. Interval development of several  tiny bilateral pulmonary nodules. While indeterminate and potentially related to infectious/inflammatory etiology, close attention recommended as early metastatic disease not excluded. 2. Otherwise stable exam. 3.  Emphysema. (ICD10-J43.9) 4.  Aortic Atherosclerois (ICD10-170.0)     HISTORY OF PRESENTING ILLNESS:  Juan [name] 77 y.o. male is here because of His recently diagnosed esophageal cancer. He is accompanied by his wife to my clinic today.  He started having sore throat and dysphagia about 4-5 months ago, and was seen by PCP, had 2 course antibiotics but is symptom progressed. He was referred to gastroenterologist Dr. Suezanne Cheshire, and underwent EGD on 11/13/2015, which showed a large fungating, friable bleeding mass in the middle third of esophagus, 22 cm to 27 cm from incisors, non-obstructing. The biopsy showed squamous cell carcinoma. His CT abdomen and pelvis was negative for metastatic disease.  He has lost 20lbs, he is on soft diet and liquids, his pain is about 7/10 pain with swallowing. He also has chest pain , intermittent, it lasts about a few minutes, no nausea or vomiting, his bowel movement is normal. He denies melena or hematochezia. He is able to function and tolerated light activities at home.  CURRENT THERAPY: Observation   INTERIM HISTORY:   Juan [name] is here for a follow up for his esophogeal cancer. He was last seen by me in May 2019. He has followed-up with NP Lacie when he was noted to have dysphagia. He is here today to discuss his recent CT scan results. He is here alone at the clinic. He states that he feels better. He states that he is back to work and his energy level is good. His eating and swallowing are good.  His hip pain has improves, but worsens when he sits for too long.   MEDICAL HISTORY:  Past Medical History:  Diagnosis Date  . Aortic atherosclerosis ([place])   . Back pain   . Barrett esophagus   . Bleeding nose   . DDD (degenerative disc disease),  thoracolumbar    Moderate to severe  . Diabetes mellitus without complication ([place])   . Diverticulosis   . Esophageal cancer ([place]) dx'd 2017  . GERD (gastroesophageal reflux disease)   . Headache   . Hearing loss    Right ear  . History of umbilical hernia repair   . Idiopathic peripheral neuropathy   . Iron deficiency anemia   . LBBB (left bundle branch block) 02/11/2016  . Left hip pain    Severe degenerative changes  . Lipoma of neck    Right  . Mixed hyperlipidemia   . PONV (postoperative nausea and vomiting)   . Reflux   . Right thyroid nodule     SURGICAL HISTORY: Past Surgical History:  Procedure Laterality Date  . COLONOSCOPY    . EUS N/A 12/05/2015   Procedure: UPPER ENDOSCOPIC ULTRASOUND (EUS) LINEAR;  Surgeon: Carol Ada, MD;  Location: [facility];  Service: Endoscopy;  Laterality: N/A;  . EUS N/A 03/10/2016   Procedure: UPPER ENDOSCOPIC ULTRASOUND (EUS) LINEAR;  Surgeon: Carol Ada, MD;  Location: [facility];  Service: Endoscopy;  Laterality: N/A;  . EUS N/A 01/07/2017   Procedure: UPPER ENDOSCOPIC ULTRASOUND (EUS) LINEAR;  Surgeon: Benson Norway,  Saralyn Pilar, MD;  Location: [facility];  Service: Endoscopy;  Laterality: N/A;  . HERNIA REPAIR    . IR GENERIC HISTORICAL  12/24/2015   IR FLUORO GUIDE PORT INSERTION RIGHT 12/24/2015 Arne Cleveland, MD [facility]  . IR GENERIC HISTORICAL  12/24/2015   IR US GUIDE VASC ACCESS RIGHT 12/24/2015 Arne Cleveland, MD [facility]  . PORTA CATH INSERTION    . SHOULDER ARTHROSCOPY W/ SUPERIOR LABRAL ANTERIOR POSTERIOR LESION REPAIR     times 2  . TOTAL HIP ARTHROPLASTY Left 07/22/2017   Procedure: LEFT TOTAL HIP ARTHROPLASTY ANTERIOR APPROACH;  Surgeon: Rod Can, MD;  Location: [facility];  Service: Orthopedics;  Laterality: Left;  . UPPER GI ENDOSCOPY  01/07/2017    SOCIAL HISTORY: Social History   Socioeconomic History  . Marital status: Married    Spouse name: Not on file  . Number of children: Not on file  . Years  of education: Not on file  . Highest education level: Not on file  Occupational History  . Not on file  Social Needs  . Financial resource strain: Not on file  . Food insecurity:    Worry: Not on file    Inability: Not on file  . Transportation needs:    Medical: Not on file    Non-medical: Not on file  Tobacco Use  . Smoking status: Former Smoker    Packs/day: 2.00    Years: 40.00    Pack years: 80.00    Last attempt to quit: 03/31/1999    Years since quitting: 18.8  . Smokeless tobacco: Never Used  Substance and Sexual Activity  . Alcohol use: Not Currently    Comment: weekend binge drinking for 30-40 years, quit in 2001  . Drug use: No  . Sexual activity: Not on file  Lifestyle  . Physical activity:    Days per week: Not on file    Minutes per session: Not on file  . Stress: Not on file  Relationships  . Social connections:    Talks on phone: Not on file    Gets together: Not on file    Attends religious service: Not on file    Active member of club or organization: Not on file    Attends meetings of clubs or organizations: Not on file    Relationship status: Not on file  . Intimate partner violence:    Fear of current or ex partner: Not on file    Emotionally abused: Not on file    Physically abused: Not on file    Forced sexual activity: Not on file  Other Topics Concern  . 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 Colon cancer Father 42  . Colon cancer Brother 63  . Heart attack Brother   . Heart disease Mother   . Heart attack Mother   . Diabetes Sister   . Stroke Sister     ALLERGIES:  has No Known Allergies.  MEDICATIONS:  Current Outpatient Medications  Medication Sig Dispense Refill  . alfuzosin (UROXATRAL)  10 MG 24 hr tablet Take 10 mg by mouth daily with breakfast.    . apixaban (ELIQUIS) 2.5 MG TABS tablet Take 1 tablet (2.5 mg total) by mouth  every 12 (twelve) hours. 60 tablet 0  . atorvastatin (LIPITOR) 20 MG tablet Take 20 mg by mouth at bedtime.     . docusate sodium (COLACE) 100 MG capsule Take 1 capsule (100 mg total) by mouth 2 (two) times daily. 10 capsule 0  . Fluticasone Furoate 50 MCG/ACT AEPB Inhale into the lungs.    . gabapentin (NEURONTIN) 300 MG capsule Take 300 mg by mouth 2 (two) times daily.    Marland Kitchen glimepiride (AMARYL) 1 MG tablet Take 1 mg by mouth daily with breakfast.    . ondansetron (ZOFRAN) 4 MG tablet Take 1 tablet (4 mg total) by mouth every 6 (six) hours as needed for nausea. 20 tablet 0  . oxyCODONE 10 MG TABS Take 1-1.5 tablets (10-15 mg total) by mouth every 4 (four) hours as needed for severe pain. 50 tablet 0  . pantoprazole (PROTONIX) 40 MG tablet Take 1 tablet (40 mg total) by mouth daily. 30 tablet 0  . prochlorperazine (COMPAZINE) 10 MG tablet Take 10 mg by mouth every 6 (six) hours as needed for nausea or vomiting.    . sodium chloride (OCEAN) 0.65 % SOLN nasal spray Place 1 spray into both nostrils as needed for congestion. 30 mL 2  . tiZANidine (ZANAFLEX) 2 MG tablet Take by mouth every 8 (eight) hours as needed for muscle spasms.    Marland Kitchen amitriptyline (ELAVIL) 10 MG tablet Take 10 mg by mouth at bedtime.      No current facility-administered medications for this visit.     REVIEW OF SYSTEMS:   Constitutional: Denies fevers, chills or abnormal night sweats. Denies fatigue. Eyes: Denies blurriness of vision, double vision or watery eyes Ears, nose, mouth, throat, and face: Denies mucositis or dysphagia  Respiratory: Denies cough, dyspnea or wheezes  Cardiovascular: Denies palpitation, chest discomfort or lower extremity swelling Gastrointestinal:  Denies nausea or change in bowel habits  Skin: Denies abnormal skin rashes Musculoskeletal: No new joint pain  or myalgia  Lymphatics: Denies new lymphadenopathy or easy bruising Neurological:Denies numbness, tingling or new weaknesses Behavioral/Psych: Mood is stable, no new changes  All other systems were reviewed with the patient and are negative.  PHYSICAL EXAMINATION:  ECOG PERFORMANCE STATUS: 2  Vitals:   01/17/18 1330  BP: 136/86  Pulse: 62  Resp: 17  Temp: 98.7 F (37.1 C)  SpO2: 96%   Filed Weights   01/17/18 1330  Weight: 204 lb 8 oz (92.8 kg)    GENERAL:alert, no distress and comfortable SKIN: skin color, texture, turgor are normal, no rashes or significant lesions EYES: normal, conjunctiva are pink and non-injected, sclera clear OROPHARYNX:no exudate, no erythema and lips, buccal mucosa, and tongue normal  NECK: supple, thyroid normal size, non-tender, without nodularity (+) 4cm soft nodule on the right lateral side of his neck LYMPH:  no palpable lymphadenopathy in the cervical, axillary or inguinal LUNGS: clear to auscultation and percussion with normal breathing effort HEART: regular rate & rhythm and no murmurs and no lower extremity edema ABDOMEN:abdomen soft, non-tender and normal bowel sounds Musculoskeletal:no cyanosis of digits and no clubbing  PSYCH: alert & oriented x 3 with fluent speech NEURO: no focal motor/sensory deficits  LABORATORY DATA:  I have reviewed the data as listed CBC Latest Ref Rng & Units 01/13/2018 12/27/2017 08/27/2017  WBC 4.0 - 10.5 K/uL 3.9(L) 4.1 3.2(L)  Hemoglobin 13.0 - 17.0 g/dL 11.5(L) 11.6(L) 11.0(L)  Hematocrit 39.0 - 52.0 % 38.1(L) 37.5(L) 36.2(L)  Platelets 150 - 400 K/uL 262 236 257  CMP Latest Ref Rng & Units 01/13/2018 12/27/2017 08/27/2017  Glucose 70 - 99 mg/dL 66(L) 90 92  BUN 8 - 23 mg/dL 10 6(L) 11  Creatinine 0.61 - 1.24 mg/dL 0.95 0.94 0.93  Sodium 135 - 145 mmol/L 144 143 138  Potassium 3.5 - 5.1 mmol/L 4.2 4.2 4.2  Chloride 98 - 111 mmol/L 109 107 104  CO2 22 - 32 mmol/L 26 28 24   Calcium 8.9 - 10.3 mg/dL 9.1  9.2 9.3  Total Protein 6.5 - 8.1 g/dL 7.1 7.5 7.9  Total Bilirubin 0.3 - 1.2 mg/dL 0.4 0.4 0.5  Alkaline Phos 38 - 126 U/L 91 79 85  AST 15 - 41 U/L 18 19 13   ALT 0 - 44 U/L 9 14 8     PATHOLOGY REPORT  EGD 11/13/16 Final microscopic diagnosis Esophagus, 0.2 cm to 27 cm, biopsy -Invasive squamous cell carcinoma, moderately differentiated   PROCEDURES  EGD 11/13/2015 Dr. Collene Mares  -A large, fungating, friable mass with bleeding was found in the mid third of the esophagus, 22 cm from the incisors and extended to 27 cm. The mass was nonobstructing and a circumferential, biopsy was taken. -Normal stomach -Normal proximal small bowel  Colonoscopy 11/13/2015 Dr. Collene Mares -Diverticulosis in the entire exam: -No specimens collected  EUS 12/05/2015 Malignant esophageal tumor was found in the upper third of the esophagus and in the middle third of the esophagus. - A mass was found in the cervical esophagus and in the thoracic esophagus. The diagnosis is squamous cell carcinoma. This was staged T3 N1/N2 Mx by endosonographic criteria. - No specimens collected.  EUS 03/10/2016 DR. HUNG  the esohpagus was thickened starting around 23 cm and it extended down in to the lower esophagus. Where the tumor was the thickest, prior to treatment, there was a marked reduction in volume. The thickness decreased from 12.8 mm down to 6-7.7 mm. Again, restaging post treatment is not the most reliable, however, it appears to be a T3. I cannot differentiate between actual tumor versus fibrosis/inflammation. Some peritumoral shoddy lymph nodes were noted. IMPRESSION:  - Benign-appearing esophageal stenosis. - Normal stomach. - Normal examined duodenum. - Wall thickening was seen in the thoracic esophagus. - No specimens collected.   EUS by Dr. Benson Norway 01/07/17 IMPRESSION - Normal esophagus. - Normal stomach. - Normal examined duodenum. - Wall thickening was seen in the upper third of the esophagus and in the  middle third of the esophagus. - One benign lymph node was visualized in the middle paraesophageal mediastinum (level 71M). - No specimens collected.   RADIOGRAPHIC STUDIES: I have personally reviewed the radiological images as listed and agreed with the findings in the report. Ct Soft Tissue Neck W Contrast  Result Date: 01/14/2018 CLINICAL DATA:  Enlarged right cervical lymph node. History of esophageal cancer status post chemotherapy and radiation. EXAM: CT NECK WITH CONTRAST TECHNIQUE: Multidetector CT imaging of the neck was performed using the standard protocol following the bolus administration of intravenous contrast. CONTRAST:  169mL OMNIPAQUE IOHEXOL 300 MG/ML  SOLN COMPARISON:  PET-CT 11/07/2016. Neck CT 08/22/2008. Head CT 12/19/2015. FINDINGS: Pharynx and larynx: Slight asymmetric fullness in the left anterior tonsillar region appears to be chronic based on the 2010 CT. No definite mass is identified. Salivary glands: No mass or inflammation. Thyroid: Unremarkable. Lymph nodes: No enlarged or suspicious lymph nodes are identified in the right neck to correspond to the physical examination finding, however there is a chronic lipoma in the right neck medial to the sternocleidomastoid muscle  which has mildly enlarged from 2010 and measures 2.6 x 2.6 x 5.1 cm (AP x transverse x craniocaudal). A small amount of somewhat amorphous nodal or other soft tissue in the left supraclavicular region is unchanged from the prior PET-CT without abnormal uptake on that examination. Vascular: Partially visualized right jugular Port-A-Cath. Minimal bilateral carotid artery atherosclerosis. Limited intracranial: Left temporal lobe encephalomalacia as seen previously. Visualized orbits: Unremarkable. Mastoids and visualized paranasal sinuses: Clear. Skeleton: No suspicious osseous lesion. Moderate to severe diffuse cervical disc degeneration. Upper chest: Reported separately. Other: None. IMPRESSION: 1. No  evidence of cervical lymphadenopathy. 2. 5 cm right neck lipoma, mildly enlarged from 2010. Electronically Signed   By: Logan Bores M.D.   On: 01/14/2018 09:51   Ct Chest W Contrast  Result Date: 01/14/2018 CLINICAL DATA:  Esophageal cancer EXAM: CT CHEST, ABDOMEN, AND PELVIS WITH CONTRAST TECHNIQUE: Multidetector CT imaging of the chest, abdomen and pelvis was performed following the standard protocol during bolus administration of intravenous contrast. CONTRAST:  166mL OMNIPAQUE IOHEXOL 300 MG/ML  SOLN COMPARISON:  05/10/2017 FINDINGS: CT CHEST FINDINGS Cardiovascular: The heart size is normal. No substantial pericardial effusion. Coronary artery calcification is evident. Atherosclerotic calcification is noted in the wall of the thoracic aorta. Right Port-A-Cath tip is positioned in the mid SVC. Mediastinum/Nodes: No mediastinal lymphadenopathy. There is no hilar lymphadenopathy.The esophagus has normal imaging features. There is no axillary lymphadenopathy. Lungs/Pleura: The central tracheobronchial airways are patent. Centrilobular emphysema noted. 5 mm right parahilar lung nodule (67/4) is new in the interval. 4 mm nodule anterior right lung (75/4) is stable. 4 mm right pulmonary nodule (74/4 along the major fissure is new in the interval. 5 mm left upper lobe nodule (33/4) is new. Dependent atelectasis noted bilaterally. Musculoskeletal: No worrisome lytic or sclerotic osseous abnormality. CT ABDOMEN PELVIS FINDINGS Hepatobiliary: No focal abnormality within the liver parenchyma. There is no evidence for gallstones, gallbladder wall thickening, or pericholecystic fluid. No intrahepatic or extrahepatic biliary dilation. Pancreas: No focal mass lesion. No dilatation of the main duct. No intraparenchymal cyst. No peripancreatic edema. Spleen: Calcified granulomata Adrenals/Urinary Tract: No adrenal nodule or mass. 16 mm cyst identified in the interpolar left kidney. There is a tiny 6 mm low-density lesion  in the upper pole the right kidney, too small to characterize but likely a cyst. No evidence for hydroureter. The urinary bladder appears normal for the degree of distention. Stomach/Bowel: Stomach is nondistended. No gastric wall thickening. No evidence of outlet obstruction. Duodenum is normally positioned as is the ligament of Treitz. No small bowel wall thickening. No small bowel dilatation. The terminal ileum is normal. The appendix is normal. No gross colonic mass. No colonic wall thickening. No substantial diverticular change. Vascular/Lymphatic: There is abdominal aortic atherosclerosis without aneurysm. There is no gastrohepatic or hepatoduodenal ligament lymphadenopathy. No intraperitoneal or retroperitoneal lymphadenopathy. No pelvic sidewall lymphadenopathy. Reproductive: The prostate gland and seminal vesicles have normal imaging features. Other: No intraperitoneal free fluid. Musculoskeletal: Small bilateral groin hernias contain only fat. Patient is status post left hip replacement. Degenerative changes noted in the lumbar spine. IMPRESSION: 1. Interval development of several tiny bilateral pulmonary nodules. While indeterminate and potentially related to infectious/inflammatory etiology, close attention recommended as early metastatic disease not excluded. 2. Otherwise stable exam. 3.  Emphysema. (ICD10-J43.9) 4.  Aortic Atherosclerois (ICD10-170.0) Electronically Signed   By: Misty Stanley M.D.   On: 01/14/2018 12:45   Ct Abdomen Pelvis W Contrast  Result Date: 01/14/2018 CLINICAL DATA:  Esophageal cancer EXAM: CT  CHEST, ABDOMEN, AND PELVIS WITH CONTRAST TECHNIQUE: Multidetector CT imaging of the chest, abdomen and pelvis was performed following the standard protocol during bolus administration of intravenous contrast. CONTRAST:  110mL OMNIPAQUE IOHEXOL 300 MG/ML  SOLN COMPARISON:  05/10/2017 FINDINGS: CT CHEST FINDINGS Cardiovascular: The heart size is normal. No substantial pericardial  effusion. Coronary artery calcification is evident. Atherosclerotic calcification is noted in the wall of the thoracic aorta. Right Port-A-Cath tip is positioned in the mid SVC. Mediastinum/Nodes: No mediastinal lymphadenopathy. There is no hilar lymphadenopathy.The esophagus has normal imaging features. There is no axillary lymphadenopathy. Lungs/Pleura: The central tracheobronchial airways are patent. Centrilobular emphysema noted. 5 mm right parahilar lung nodule (67/4) is new in the interval. 4 mm nodule anterior right lung (75/4) is stable. 4 mm right pulmonary nodule (74/4 along the major fissure is new in the interval. 5 mm left upper lobe nodule (33/4) is new. Dependent atelectasis noted bilaterally. Musculoskeletal: No worrisome lytic or sclerotic osseous abnormality. CT ABDOMEN PELVIS FINDINGS Hepatobiliary: No focal abnormality within the liver parenchyma. There is no evidence for gallstones, gallbladder wall thickening, or pericholecystic fluid. No intrahepatic or extrahepatic biliary dilation. Pancreas: No focal mass lesion. No dilatation of the main duct. No intraparenchymal cyst. No peripancreatic edema. Spleen: Calcified granulomata Adrenals/Urinary Tract: No adrenal nodule or mass. 16 mm cyst identified in the interpolar left kidney. There is a tiny 6 mm low-density lesion in the upper pole the right kidney, too small to characterize but likely a cyst. No evidence for hydroureter. The urinary bladder appears normal for the degree of distention. Stomach/Bowel: Stomach is nondistended. No gastric wall thickening. No evidence of outlet obstruction. Duodenum is normally positioned as is the ligament of Treitz. No small bowel wall thickening. No small bowel dilatation. The terminal ileum is normal. The appendix is normal. No gross colonic mass. No colonic wall thickening. No substantial diverticular change. Vascular/Lymphatic: There is abdominal aortic atherosclerosis without aneurysm. There is no  gastrohepatic or hepatoduodenal ligament lymphadenopathy. No intraperitoneal or retroperitoneal lymphadenopathy. No pelvic sidewall lymphadenopathy. Reproductive: The prostate gland and seminal vesicles have normal imaging features. Other: No intraperitoneal free fluid. Musculoskeletal: Small bilateral groin hernias contain only fat. Patient is status post left hip replacement. Degenerative changes noted in the lumbar spine. IMPRESSION: 1. Interval development of several tiny bilateral pulmonary nodules. While indeterminate and potentially related to infectious/inflammatory etiology, close attention recommended as early metastatic disease not excluded. 2. Otherwise stable exam. 3.  Emphysema. (ICD10-J43.9) 4.  Aortic Atherosclerois (ICD10-170.0) Electronically Signed   By: Misty Stanley M.D.   On: 01/14/2018 12:45   01/14/2018 CT Neck IMPRESSION: 1. No evidence of cervical lymphadenopathy. 2. 5 cm right neck lipoma, mildly enlarged from 2010.  01/14/2018 CT CAP IMPRESSION: 1. Interval development of several tiny bilateral pulmonary nodules. While indeterminate and potentially related to infectious/inflammatory etiology, close attention recommended as early metastatic disease not excluded. 2. Otherwise stable exam. 3.  Emphysema. (ICD10-J43.9) 4.  Aortic Atherosclerois (ICD10-170.0)  CT CAP W Contrast 05/10/17 IMPRESSION: 1. No new or progressive findings to suggest recurrent or metastatic disease in the chest or abdomen on today's study. 2.  Aortic Atherosclerois (ICD10-170.0)  PET 11/07/16  IMPRESSION: Negative PET-CT. No findings for recurrent esophageal cancer or metastatic disease.   ASSESSMENT & PLAN:  77 y.o.  gentleman, without significant past medical history except acid reflux, presented with progressive dysphagia and odynophagia and weight loss.  1. 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 -EUS revealed a T3 lesion, N1 vs N2, locally advanced disease. 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 Servando Snare has mutually agreed not to pursue esophagectomy due to his advanced age and general health condition -We previously discussed that his esophageal cancer is unlikely cured by chemoradiation alone without surgery. I reviewed his repeated EUS after his chemotherapy and irradiation, which showed good response to chemoradiation, but possible residual tumor. -11/08/16 PET scan was normal without evidence of recurrence.  -His 01/07/17 EUS was unremarkable without evidence of residual disease -CT CAP from 05/10/17 revealed no new or progressive findings  to suggest recurrent or metastatic disease in the chest or abdomen. I discussed results with pt and he is pleased. Plan to repeat in 04/2018 -due to his recently dysphagia and neck mass, restaging CT CAP was obtained on 01/14/2018, which revealed Interval development of several tiny bilateral pulmonary nodules, which are not impressive, I think the possibility of metastatic disease is not high.  No other evidence of recurrent or metastatic disease.  The palpable right neck mass is a lipoma based on the CT scan.  I discussed the above with the patient.  -I will repeat scan in 4 months -He is clinically doing well overall, will continue surveillance. He will see Dr. Servando Snare as needed. - I will request port removal -f/u in 4 months  2. Dysphagia, resolved  -His dysphagia has mostly resolved after chemotherapy and irradiation -follow up with dietician Pamala Hurry as needed -resolved now   3.  History of steroids induced psychosis -Resolved now. We'll avoid steroids in the future.  4. Leukopenia and anemia -Secondary to chemotherapy radiation -Labs from 10/17 revealed Hg 11.5 WBC 3.9 and platelets 262K.  -We'll continue monitoring, no need for blood transfusion  5. Chronic back pain, left hip pain  -I previously encouraged the patient to follow with primary care physician for back pain -I will not refill his narcotics. -He has developed left hip pain since June 2018, which he contributes to arthritis. He follow up with his PCP. Managed with Gabapentin and oxycodone.   -PET scan in August 2018 was negative for metastasis -He underwent left total hip arthroplasty on 07/22/17 -His hip pain is much better now.   PLAN:  -Recent CT scan and lab results reviewed with patient, no evidence of recurrent or metastatic disease  -lab and f/u in 4 months with CT chest wo contrast scan a few days before - I will request port removal  All questions were answered. The patient knows to call the clinic with any problems, questions or concerns.  I spent 20 minutes counseling the patient face to face. The total time spent in the appointment was 25 minutes and more than 50% was on counseling.  Dierdre Searles Dweik am acting as scribe for Dr. [name].  I have reviewed the above documentation for accuracy and completeness, and I agree with the above.     [name], MD 01/17/2018

## 2018-01-17 NOTE — Telephone Encounter (Signed)
Pt sched per 10/21 sch message. Pt aware of d&t  °

## 2018-01-18 ENCOUNTER — Encounter: Payer: Self-pay | Admitting: Hematology

## 2018-02-03 ENCOUNTER — Other Ambulatory Visit: Payer: Self-pay | Admitting: Radiology

## 2018-02-04 ENCOUNTER — Other Ambulatory Visit: Payer: Self-pay | Admitting: Radiology

## 2018-02-07 ENCOUNTER — Encounter (HOSPITAL_COMMUNITY): Payer: Self-pay

## 2018-02-07 ENCOUNTER — Ambulatory Visit (HOSPITAL_COMMUNITY)
Admission: RE | Admit: 2018-02-07 | Discharge: 2018-02-07 | Disposition: A | Discharge status: Home / Self Care | Payer: Medicare HMO | Source: Ambulatory Visit | Attending: Hematology | Admitting: Hematology

## 2018-02-07 DIAGNOSIS — Z923 Personal history of irradiation: Secondary | ICD-10-CM | POA: Insufficient documentation

## 2018-02-07 DIAGNOSIS — Z8501 Personal history of malignant neoplasm of esophagus: Secondary | ICD-10-CM | POA: Diagnosis not present

## 2018-02-07 DIAGNOSIS — E782 Mixed hyperlipidemia: Secondary | ICD-10-CM | POA: Diagnosis not present

## 2018-02-07 DIAGNOSIS — Z7901 Long term (current) use of anticoagulants: Secondary | ICD-10-CM | POA: Insufficient documentation

## 2018-02-07 DIAGNOSIS — K219 Gastro-esophageal reflux disease without esophagitis: Secondary | ICD-10-CM | POA: Diagnosis not present

## 2018-02-07 DIAGNOSIS — E119 Type 2 diabetes mellitus without complications: Secondary | ICD-10-CM | POA: Diagnosis not present

## 2018-02-07 DIAGNOSIS — I447 Left bundle-branch block, unspecified: Secondary | ICD-10-CM | POA: Diagnosis not present

## 2018-02-07 DIAGNOSIS — Z9889 Other specified postprocedural states: Secondary | ICD-10-CM | POA: Insufficient documentation

## 2018-02-07 DIAGNOSIS — H9191 Unspecified hearing loss, right ear: Secondary | ICD-10-CM | POA: Insufficient documentation

## 2018-02-07 DIAGNOSIS — Z96642 Presence of left artificial hip joint: Secondary | ICD-10-CM | POA: Insufficient documentation

## 2018-02-07 DIAGNOSIS — Z452 Encounter for adjustment and management of vascular access device: Secondary | ICD-10-CM | POA: Insufficient documentation

## 2018-02-07 DIAGNOSIS — Z9221 Personal history of antineoplastic chemotherapy: Secondary | ICD-10-CM | POA: Insufficient documentation

## 2018-02-07 DIAGNOSIS — Z79899 Other long term (current) drug therapy: Secondary | ICD-10-CM | POA: Insufficient documentation

## 2018-02-07 DIAGNOSIS — C159 Malignant neoplasm of esophagus, unspecified: Secondary | ICD-10-CM | POA: Diagnosis not present

## 2018-02-07 DIAGNOSIS — C154 Malignant neoplasm of middle third of esophagus: Secondary | ICD-10-CM

## 2018-02-07 DIAGNOSIS — Z7984 Long term (current) use of oral hypoglycemic drugs: Secondary | ICD-10-CM | POA: Diagnosis not present

## 2018-02-07 HISTORY — PX: IR REMOVAL TUN ACCESS W/ PORT W/O FL MOD SED: IMG2290

## 2018-02-07 LAB — CBC WITH DIFFERENTIAL/PLATELET
Abs Immature Granulocytes: 0 10*3/uL (ref 0.00–0.07)
Basophils Absolute: 0 10*3/uL (ref 0.0–0.1)
Basophils Relative: 1 %
Eosinophils Absolute: 0.1 10*3/uL (ref 0.0–0.5)
Eosinophils Relative: 3 %
HCT: 39.8 % (ref 39.0–52.0)
Hemoglobin: 11.8 g/dL — ABNORMAL LOW (ref 13.0–17.0)
Immature Granulocytes: 0 %
Lymphocytes Relative: 35 %
Lymphs Abs: 0.9 10*3/uL (ref 0.7–4.0)
MCH: 22.3 pg — ABNORMAL LOW (ref 26.0–34.0)
MCHC: 29.6 g/dL — ABNORMAL LOW (ref 30.0–36.0)
MCV: 75.2 fL — ABNORMAL LOW (ref 80.0–100.0)
Monocytes Absolute: 0.3 10*3/uL (ref 0.1–1.0)
Monocytes Relative: 11 %
Neutro Abs: 1.3 10*3/uL — ABNORMAL LOW (ref 1.7–7.7)
Neutrophils Relative %: 50 %
Platelets: 277 10*3/uL (ref 150–400)
RBC: 5.29 MIL/uL (ref 4.22–5.81)
RDW: 16.6 % — ABNORMAL HIGH (ref 11.5–15.5)
WBC: 2.7 10*3/uL — ABNORMAL LOW (ref 4.0–10.5)
nRBC: 0 % (ref 0.0–0.2)

## 2018-02-07 LAB — PROTIME-INR
INR: 0.99
Prothrombin Time: 13 seconds (ref 11.4–15.2)

## 2018-02-07 MED ORDER — FENTANYL CITRATE (PF) 100 MCG/2ML IJ SOLN
INTRAMUSCULAR | Status: AC | PRN
  Administered 2018-02-07 (×2): 50 ug via INTRAVENOUS

## 2018-02-07 MED ORDER — MIDAZOLAM HCL 2 MG/2ML IJ SOLN
INTRAMUSCULAR | Status: AC | PRN
  Administered 2018-02-07 (×2): 1 mg via INTRAVENOUS

## 2018-02-07 MED ORDER — MIDAZOLAM HCL 2 MG/2ML IJ SOLN
INTRAMUSCULAR | Status: AC
  Filled 2018-02-07: qty 4

## 2018-02-07 MED ORDER — LIDOCAINE-EPINEPHRINE (PF) 2 %-1:200000 IJ SOLN
INTRAMUSCULAR | Status: AC
  Filled 2018-02-07: qty 20

## 2018-02-07 MED ORDER — LIDOCAINE HCL 1 % IJ SOLN
INTRAMUSCULAR | Status: AC
  Filled 2018-02-07: qty 20

## 2018-02-07 MED ORDER — CEFAZOLIN SODIUM-DEXTROSE 2-4 GM/100ML-% IV SOLN
INTRAVENOUS | Status: AC
  Administered 2018-02-07: 2 g via INTRAVENOUS
  Filled 2018-02-07: qty 100

## 2018-02-07 MED ORDER — LIDOCAINE-EPINEPHRINE (PF) 1 %-1:200000 IJ SOLN
INTRAMUSCULAR | Status: AC | PRN
  Administered 2018-02-07: 10 mL

## 2018-02-07 MED ORDER — FENTANYL CITRATE (PF) 100 MCG/2ML IJ SOLN
INTRAMUSCULAR | Status: AC
  Filled 2018-02-07: qty 2

## 2018-02-07 MED ORDER — CEFAZOLIN SODIUM-DEXTROSE 2-4 GM/100ML-% IV SOLN
2.0000 g | INTRAVENOUS | Status: AC
  Administered 2018-02-07: 2 g via INTRAVENOUS

## 2018-02-07 MED ORDER — SODIUM CHLORIDE 0.9 % IV SOLN
INTRAVENOUS | Status: DC
  Administered 2018-02-07: 13:00:00 via INTRAVENOUS

## 2018-02-07 NOTE — Discharge Instructions (Signed)
Implanted Port Removal, Care After °Refer to this sheet in the next few weeks. These instructions provide you with information about caring for yourself after your procedure. Your health care provider may also give you more specific instructions. Your treatment has been planned according to current medical practices, but problems sometimes occur. Call your health care provider if you have any problems or questions after your procedure. °What can I expect after the procedure? °After the procedure, it is common to have: °· Soreness or pain near your incision. °· Some swelling or bruising near your incision. ° °Follow these instructions at home: °Medicines °· Take over-the-counter and prescription medicines only as told by your health care provider. °· If you were prescribed an antibiotic medicine, take it as told by your health care provider. Do not stop taking the antibiotic even if you start to feel better. °Bathing °· Do not take baths, swim, or use a hot tub until your health care provider approves. Ask your health care provider if you can take showers. You may only be allowed to take sponge baths for bathing. °Incision care °· Follow instructions from your health care provider about how to take care of your incision. Make sure you: °? Wash your hands with soap and water before you change your bandage (dressing). If soap and water are not available, use hand sanitizer. °? Change your dressing as told by your health care provider. °? Keep your dressing dry. °? Leave stitches (sutures), skin glue, or adhesive strips in place. These skin closures may need to stay in place for 2 weeks or longer. If adhesive strip edges start to loosen and curl up, you may trim the loose edges. Do not remove adhesive strips completely unless your health care provider tells you to do that. °· Check your incision area every day for signs of infection. Check for: °? More redness, swelling, or pain. °? More fluid or  blood. °? Warmth. °? Pus or a bad smell. °Driving °· If you received a sedative, do not drive for 24 hours after the procedure. °· If you did not receive a sedative, ask your health care provider when it is safe to drive. °Activity °· Return to your normal activities as told by your health care provider. Ask your health care provider what activities are safe for you. °· Until your health care provider says it is safe: °? Do not lift anything that is heavier than 10 lb (4.5 kg). °? Do not do activities that involve lifting your arms over your head. °General instructions °· Do not use any tobacco products, such as cigarettes, chewing tobacco, and e-cigarettes. Tobacco can delay healing. If you need help quitting, ask your health care provider. °· Keep all follow-up visits as told by your health care provider. This is important. °Contact a health care provider if: °· You have more redness, swelling, or pain around your incision. °· You have more fluid or blood coming from your incision. °· Your incision feels warm to the touch. °· You have pus or a bad smell coming from your incision. °· You have a fever. °· You have pain that is not relieved by your pain medicine. °Get help right away if: °· You have chest pain. °· You have difficulty breathing. °This information is not intended to replace advice given to you by your health care provider. Make sure you discuss any questions you have with your health care provider. °Document Released: 02/25/2015 Document Revised: 08/22/2015 Document Reviewed: 12/19/2014 °Elsevier Interactive Patient   Education  2018 [place].   Moderate Conscious Sedation, Adult, Care After These instructions provide you with information about caring for yourself after your procedure. Your health care provider may also give you more specific instructions. Your treatment has been planned according to current medical practices, but problems sometimes occur. Call your health care provider if you  have any problems or questions after your procedure. What can I expect after the procedure? After your procedure, it is common:  To feel sleepy for several hours.  To feel clumsy and have poor balance for several hours.  To have poor judgment for several hours.  To vomit if you eat too soon.  Follow these instructions at home: For at least 24 hours after the procedure:   Do not: ? Participate in activities where you could fall or become injured. ? Drive. ? Use heavy machinery. ? Drink alcohol. ? Take sleeping pills or medicines that cause drowsiness. ? Make important decisions or sign legal documents. ? Take care of children on your own.  Rest. Eating and drinking  Follow the diet recommended by your health care provider.  If you vomit: ? Drink water, juice, or soup when you can drink without vomiting. ? Make sure you have little or no nausea before eating solid foods. General instructions  Have a responsible adult stay with you until you are awake and alert.  Take over-the-counter and prescription medicines only as told by your health care provider.  If you smoke, do not smoke without supervision.  Keep all follow-up visits as told by your health care provider. This is important. Contact a health care provider if:  You keep feeling nauseous or you keep vomiting.  You feel light-headed.  You develop a rash.  You have a fever. Get help right away if:  You have trouble breathing. This information is not intended to replace advice given to you by your health care provider. Make sure you discuss any questions you have with your health care provider. Document Released: 01/04/2013 Document Revised: 08/19/2015 Document Reviewed: 07/06/2015 Elsevier Interactive Patient Education  2018 [employer].  May remove dressing and shower in 48 hours.  Keep site clean and dry. Look at site daily and report signs of infection.

## 2018-02-07 NOTE — Procedures (Signed)
Successful removal of right chest port.  No immediate complication.  Minimal blood loss.  See full report in IMAGING.

## 2018-02-07 NOTE — H&P (Signed)
Referring Physician(s): Feng,Yan  Supervising Physician: Markus Daft  Patient Status:  [facility]  Chief Complaint:  "I'm getting my port out"  Subjective: Patient familiar to IR service from prior Port-A-Cath placement on 12/24/2015.  He has a history of esophageal cancer, status post chemoradiation (last chemo about 2 years ago).  Recent imaging reveals no new or progressive findings.  He no longer needs port and presents today for Port-A-Cath removal.  He currently denies fever, chest pain, dyspnea, abdominal/back pain, nausea, vomiting or bleeding.  He does have occasional headache and occasional cough.  Past Medical History:  Diagnosis Date  . Aortic atherosclerosis ([place])   . Back pain   . Barrett esophagus   . Bleeding nose   . DDD (degenerative disc disease), thoracolumbar    Moderate to severe  . Diabetes mellitus without complication ([place])   . Diverticulosis   . Esophageal cancer ([place]) dx'd 2017  . GERD (gastroesophageal reflux disease)   . Headache   . Hearing loss    Right ear  . History of umbilical hernia repair   . Idiopathic peripheral neuropathy   . Iron deficiency anemia   . LBBB (left bundle branch block) 02/11/2016  . Left hip pain    Severe degenerative changes  . Lipoma of neck    Right  . Mixed hyperlipidemia   . PONV (postoperative nausea and vomiting)   . Reflux   . Right thyroid nodule    Past Surgical History:  Procedure Laterality Date  . COLONOSCOPY    . EUS N/A 12/05/2015   Procedure: UPPER ENDOSCOPIC ULTRASOUND (EUS) LINEAR;  Surgeon: Carol Ada, MD;  Location: [facility];  Service: Endoscopy;  Laterality: N/A;  . EUS N/A 03/10/2016   Procedure: UPPER ENDOSCOPIC ULTRASOUND (EUS) LINEAR;  Surgeon: Carol Ada, MD;  Location[facility];  Service: Endoscopy;  Laterality: N/A;  . EUS N/A 01/07/2017   Procedure: UPPER ENDOSCOPIC ULTRASOUND (EUS) LINEAR;  Surgeon: Carol Ada, MD;  Location: [place];  Service: Endoscopy;   Laterality: N/A;  . HERNIA REPAIR    . IR GENERIC HISTORICAL  12/24/2015   IR FLUORO GUIDE PORT INSERTION RIGHT 12/24/2015 Arne Cleveland, MD [facility]  . IR GENERIC HISTORICAL  12/24/2015   IR US GUIDE VASC ACCESS RIGHT 12/24/2015 Arne Cleveland, MD [facility]  . PORTA CATH INSERTION    . SHOULDER ARTHROSCOPY W/ SUPERIOR LABRAL ANTERIOR POSTERIOR LESION REPAIR     times 2  . TOTAL HIP ARTHROPLASTY Left 07/22/2017   Procedure: LEFT TOTAL HIP ARTHROPLASTY ANTERIOR APPROACH;  Surgeon: Rod Can, MD;  Location[facility];  Service: Orthopedics;  Laterality: Left;  . UPPER GI ENDOSCOPY  01/07/2017      Allergies: Patient has no known allergies.  Medications: Prior to Admission medications   Medication Sig Start Date End Date Taking? Authorizing Provider  alfuzosin (UROXATRAL) 10 MG 24 hr tablet Take 10 mg by mouth daily with breakfast.   Yes [provider]  apixaban (ELIQUIS) 2.5 MG TABS tablet Take 1 tablet (2.5 mg total) by mouth every 12 (twelve) hours. 07/23/17  Yes Swinteck, Aaron Edelman, MD  atorvastatin (LIPITOR) 20 MG tablet Take 20 mg by mouth at bedtime.  11/30/13  Yes [provider]  docusate sodium (COLACE) 100 MG capsule Take 1 capsule (100 mg total) by mouth 2 (two) times daily. 07/23/17  Yes Swinteck, Aaron Edelman, MD  gabapentin (NEURONTIN) 300 MG capsule Take 300 mg by mouth 2 (two) times daily.   Yes [provider]  glimepiride (AMARYL) 1 MG tablet Take 1 mg by mouth daily with breakfast.   Yes [provider]  ondansetron (ZOFRAN) 4 MG tablet Take 1 tablet (4 mg total) by mouth every 6 (six) hours as needed for nausea. 07/23/17  Yes Swinteck, Aaron Edelman, MD  oxyCODONE 10 MG TABS Take 1-1.5 tablets (10-15 mg total) by mouth every 4 (four) hours as needed for severe pain. 07/23/17  Yes Swinteck, Aaron Edelman, MD  pantoprazole (PROTONIX) 40 MG tablet Take 1 tablet (40 mg total) by mouth daily. 03/09/16  Yes Lorella Nimrod, MD  prochlorperazine (COMPAZINE) 10 MG  tablet Take 10 mg by mouth every 6 (six) hours as needed for nausea or vomiting.   Yes [provider]  sodium chloride (OCEAN) 0.65 % SOLN nasal spray Place 1 spray into both nostrils as needed for congestion. 03/02/16  Yes Lorella Nimrod, MD  tiZANidine (ZANAFLEX) 2 MG tablet Take by mouth every 8 (eight) hours as needed for muscle spasms.   Yes [provider]  amitriptyline (ELAVIL) 10 MG tablet Take 10 mg by mouth at bedtime.  11/16/15 09/09/17  [provider]  Fluticasone Furoate 50 MCG/ACT AEPB Inhale into the lungs.    [provider]     Vital Signs: BP 139/86   Pulse (!) 54   Temp 97.7 F (36.5 C) (Oral)   Resp 18   SpO2 98%   Physical Exam awake, alert.  Chest with distant but clear breath sounds bilat.  Clean, intact right chest wall Port-A-Cath.  Heart with bradycardic but regular rhythm.  Abdomen soft, positive bowel sounds, nontender.No LE edema.  Imaging: No results found.  Labs:  CBC: Recent Labs    07/24/17 0540 08/27/17 1252 12/27/17 1216 01/13/18 1310  WBC 6.4 3.2* 4.1 3.9*  HGB 9.7* 11.0* 11.6* 11.5*  HCT 31.0* 36.2* 37.5* 38.1*  PLT 172 257 236 262    COAGS: No results for input(s): INR, APTT in the last 8760 hours.  BMP: Recent Labs    07/23/17 0543 08/27/17 1252 12/27/17 1216 01/13/18 1310  NA 137 138 143 144  K 4.1 4.2 4.2 4.2  CL 106 104 107 109  CO2 21* 24 28 26   GLUCOSE 126* 92 90 66*  BUN 12 11 6* 10  CALCIUM 8.2* 9.3 9.2 9.1  CREATININE 0.86 0.93 0.94 0.95  GFRNONAA >60 >60 >60 >60  GFRAA >60 >60 >60 >60    LIVER FUNCTION TESTS: Recent Labs    05/10/17 0735 08/27/17 1252 12/27/17 1216 01/13/18 1310  BILITOT 0.4 0.5 0.4 0.4  AST 15 13 19 18   ALT 10 8 14 9   ALKPHOS 88 85 79 91  PROT 7.2 7.9 7.5 7.1  ALBUMIN 3.5 4.1 4.0 3.7    Assessment and Plan: Pt with history of esophageal cancer, status post chemoradiation (last chemo about 2 years ago).  Recent imaging reveals no new or  progressive findings.  He no longer needs port and presents today for Port-A-Cath removal.  Details/risks of procedure, including but not limited to, internal bleeding, infection, injury to adjacent structures discussed with patient and family with their understanding and consent.  LABS PENDING   Electronically Signed: D. Rowe Robert, PA-C 02/07/2018, 1:18 PM   I spent a total of 20 minutes at the the patient's bedside AND on the patient's hospital floor or unit, greater than 50% of which was counseling/coordinating care for Port-A-Cath removal

## 2018-02-16 DIAGNOSIS — R1310 Dysphagia, unspecified: Secondary | ICD-10-CM | POA: Diagnosis not present

## 2018-02-16 DIAGNOSIS — C159 Malignant neoplasm of esophagus, unspecified: Secondary | ICD-10-CM | POA: Diagnosis not present

## 2018-04-20 DIAGNOSIS — E1165 Type 2 diabetes mellitus with hyperglycemia: Secondary | ICD-10-CM | POA: Diagnosis not present

## 2018-04-20 DIAGNOSIS — E782 Mixed hyperlipidemia: Secondary | ICD-10-CM | POA: Diagnosis not present

## 2018-04-20 DIAGNOSIS — G609 Hereditary and idiopathic neuropathy, unspecified: Secondary | ICD-10-CM | POA: Diagnosis not present

## 2018-05-04 ENCOUNTER — Other Ambulatory Visit: Payer: Self-pay | Admitting: Gastroenterology

## 2018-05-04 DIAGNOSIS — K5730 Diverticulosis of large intestine without perforation or abscess without bleeding: Secondary | ICD-10-CM | POA: Diagnosis not present

## 2018-05-04 DIAGNOSIS — R1310 Dysphagia, unspecified: Secondary | ICD-10-CM | POA: Diagnosis not present

## 2018-05-04 DIAGNOSIS — C159 Malignant neoplasm of esophagus, unspecified: Secondary | ICD-10-CM | POA: Diagnosis not present

## 2018-05-05 NOTE — Anesthesia Preprocedure Evaluation (Addendum)
Anesthesia Evaluation  Patient identified by MRN, date of birth, ID band Patient awake    Reviewed: Allergy & Precautions, NPO status , Patient's Chart, lab work & pertinent test results  History of Anesthesia Complications (+) PONV and history of anesthetic complications  Airway Mallampati: III       Dental  (+) Lower Dentures, Upper Dentures   Pulmonary former smoker,    Pulmonary exam normal breath sounds clear to auscultation       Cardiovascular negative cardio ROS Normal cardiovascular exam Rhythm:Regular Rate:Normal  ECG: SB, rate 58, LAD, LBBB   Neuro/Psych  Headaches, PSYCHIATRIC DISORDERS    GI/Hepatic GERD  Medicated and Controlled,  Endo/Other  diabetes, Oral Hypoglycemic Agents  Renal/GU   negative genitourinary   Musculoskeletal  (+) Arthritis , Osteoarthritis,    Abdominal Normal abdominal exam  (+)   Peds  Hematology  (+) anemia , HLD   Anesthesia Other Findings Left hip degenerative joint disease  Reproductive/Obstetrics                            Anesthesia Physical  Anesthesia Plan  ASA: III  Anesthesia Plan: MAC   Post-op Pain Management:    Induction: Intravenous  PONV Risk Score and Plan: 1 and Propofol infusion and Treatment may vary due to age or medical condition  Airway Management Planned: Natural Airway and Mask  Additional Equipment:   Intra-op Plan:   Post-operative Plan:   Informed Consent: I have reviewed the patients History and Physical, chart, labs and discussed the procedure including the risks, benefits and alternatives for the proposed anesthesia with the patient or authorized representative who has indicated his/her understanding and acceptance.     Dental advisory given  Plan Discussed with: CRNA  Anesthesia Plan Comments:        Anesthesia Quick Evaluation

## 2018-05-06 ENCOUNTER — Encounter (HOSPITAL_COMMUNITY): Payer: Self-pay | Admitting: *Deleted

## 2018-05-06 ENCOUNTER — Other Ambulatory Visit: Payer: Self-pay

## 2018-05-06 ENCOUNTER — Ambulatory Visit (HOSPITAL_COMMUNITY)
Admission: RE | Admit: 2018-05-06 | Discharge: 2018-05-06 | Disposition: A | Discharge status: Home / Self Care | Payer: 59 | Source: Ambulatory Visit | Attending: Gastroenterology | Admitting: Gastroenterology

## 2018-05-06 ENCOUNTER — Ambulatory Visit (HOSPITAL_COMMUNITY): Payer: 59 | Admitting: Anesthesiology

## 2018-05-06 ENCOUNTER — Encounter (HOSPITAL_COMMUNITY)
Admission: RE | Disposition: A | Discharge status: Home / Self Care | Payer: Self-pay | Source: Ambulatory Visit | Attending: Gastroenterology

## 2018-05-06 DIAGNOSIS — E119 Type 2 diabetes mellitus without complications: Secondary | ICD-10-CM | POA: Diagnosis not present

## 2018-05-06 DIAGNOSIS — E782 Mixed hyperlipidemia: Secondary | ICD-10-CM | POA: Insufficient documentation

## 2018-05-06 DIAGNOSIS — R634 Abnormal weight loss: Secondary | ICD-10-CM | POA: Diagnosis not present

## 2018-05-06 DIAGNOSIS — K219 Gastro-esophageal reflux disease without esophagitis: Secondary | ICD-10-CM | POA: Insufficient documentation

## 2018-05-06 DIAGNOSIS — Z79899 Other long term (current) drug therapy: Secondary | ICD-10-CM | POA: Insufficient documentation

## 2018-05-06 DIAGNOSIS — E1142 Type 2 diabetes mellitus with diabetic polyneuropathy: Secondary | ICD-10-CM | POA: Diagnosis not present

## 2018-05-06 DIAGNOSIS — Z79891 Long term (current) use of opiate analgesic: Secondary | ICD-10-CM | POA: Diagnosis not present

## 2018-05-06 DIAGNOSIS — Z8501 Personal history of malignant neoplasm of esophagus: Secondary | ICD-10-CM | POA: Diagnosis not present

## 2018-05-06 DIAGNOSIS — Z791 Long term (current) use of non-steroidal anti-inflammatories (NSAID): Secondary | ICD-10-CM | POA: Insufficient documentation

## 2018-05-06 DIAGNOSIS — Z7982 Long term (current) use of aspirin: Secondary | ICD-10-CM | POA: Diagnosis not present

## 2018-05-06 DIAGNOSIS — K222 Esophageal obstruction: Secondary | ICD-10-CM | POA: Diagnosis not present

## 2018-05-06 DIAGNOSIS — Z96642 Presence of left artificial hip joint: Secondary | ICD-10-CM | POA: Diagnosis not present

## 2018-05-06 DIAGNOSIS — Z923 Personal history of irradiation: Secondary | ICD-10-CM | POA: Diagnosis not present

## 2018-05-06 DIAGNOSIS — Z87891 Personal history of nicotine dependence: Secondary | ICD-10-CM | POA: Insufficient documentation

## 2018-05-06 DIAGNOSIS — R1310 Dysphagia, unspecified: Secondary | ICD-10-CM | POA: Diagnosis not present

## 2018-05-06 HISTORY — PX: BALLOON DILATION: SHX5330

## 2018-05-06 HISTORY — PX: ESOPHAGOGASTRODUODENOSCOPY (EGD) WITH PROPOFOL: SHX5813

## 2018-05-06 LAB — GLUCOSE, CAPILLARY
Glucose-Capillary: 96 mg/dL (ref 70–99)
Glucose-Capillary: 103 mg/dL — ABNORMAL HIGH (ref 70–99)

## 2018-05-06 SURGERY — ESOPHAGOGASTRODUODENOSCOPY (EGD) WITH PROPOFOL
Anesthesia: Monitor Anesthesia Care

## 2018-05-06 MED ORDER — LACTATED RINGERS IV SOLN
INTRAVENOUS | Status: DC
  Administered 2018-05-06: 07:00:00 via INTRAVENOUS

## 2018-05-06 MED ORDER — PROPOFOL 500 MG/50ML IV EMUL
INTRAVENOUS | Status: DC | PRN
  Administered 2018-05-06: 100 ug/kg/min via INTRAVENOUS

## 2018-05-06 MED ORDER — PROPOFOL 500 MG/50ML IV EMUL: INTRAVENOUS | Status: DC | PRN

## 2018-05-06 MED ORDER — PROPOFOL 10 MG/ML IV BOLUS
INTRAVENOUS | Status: AC
  Filled 2018-05-06: qty 60

## 2018-05-06 MED ORDER — LIDOCAINE HCL (CARDIAC) PF 100 MG/5ML IV SOSY
PREFILLED_SYRINGE | INTRAVENOUS | Status: DC | PRN
  Administered 2018-05-06: 100 mg via INTRAVENOUS

## 2018-05-06 MED ORDER — PROPOFOL 10 MG/ML IV BOLUS
INTRAVENOUS | Status: DC | PRN
  Administered 2018-05-06: 75 mg via INTRAVENOUS

## 2018-05-06 MED ORDER — SODIUM CHLORIDE 0.9 % IV SOLN: INTRAVENOUS | Status: DC

## 2018-05-06 SURGICAL SUPPLY — 15 items

## 2018-05-06 NOTE — Discharge Instructions (Signed)

## 2018-05-06 NOTE — H&P (Signed)
Juan [name] HPI:  The patient started to have issues with dysphagia to solid over the past 2-3 months. He denies any problems with nausea, vomiting, or hematemesis. He was treated for his esophageal cancer with radiation.  Past Medical History:  Diagnosis Date  . Aortic atherosclerosis ([place])   . Back pain   . Barrett esophagus   . Bleeding nose   . DDD (degenerative disc disease), thoracolumbar    Moderate to severe  . Diabetes mellitus without complication ([place])   . Diverticulosis   . Esophageal cancer ([place]) dx'd 2017  . GERD (gastroesophageal reflux disease)   . Headache   . Hearing loss    Right ear  . History of umbilical hernia repair   . Idiopathic peripheral neuropathy   . Iron deficiency anemia   . LBBB (left bundle branch block) 02/11/2016  . Left hip pain    Severe degenerative changes  . Lipoma of neck    Right  . Mixed hyperlipidemia   . PONV (postoperative nausea and vomiting)   . Reflux   . Right thyroid nodule     Past Surgical History:  Procedure Laterality Date  . COLONOSCOPY    . EUS N/A 12/05/2015   Procedure: UPPER ENDOSCOPIC ULTRASOUND (EUS) LINEAR;  Surgeon: Carol Ada, MD;  [facility];  Service: Endoscopy;  Laterality: N/A;  . EUS N/A 03/10/2016   Procedure: UPPER ENDOSCOPIC ULTRASOUND (EUS) LINEAR;  Surgeon: Carol Ada, MD;  [facility];  Service: Endoscopy;  Laterality: N/A;  . EUS N/A 01/07/2017   Procedure: UPPER ENDOSCOPIC ULTRASOUND (EUS) LINEAR;  Surgeon: Carol Ada, MD;  Location: [place];  Service: Endoscopy;  Laterality: N/A;  . HERNIA REPAIR    . IR GENERIC HISTORICAL  12/24/2015   IR FLUORO GUIDE PORT INSERTION RIGHT 12/24/2015 Arne Cleveland, MD [facility]  . IR GENERIC HISTORICAL  12/24/2015   IR US GUIDE VASC ACCESS RIGHT 12/24/2015 Arne Cleveland, MD [facility]  . IR REMOVAL TUN ACCESS W/ PORT W/O FL MOD SED  02/07/2018  . PORTA CATH INSERTION    . SHOULDER ARTHROSCOPY W/ SUPERIOR LABRAL  ANTERIOR POSTERIOR LESION REPAIR     times 2  . TOTAL HIP ARTHROPLASTY Left 07/22/2017   Procedure: LEFT TOTAL HIP ARTHROPLASTY ANTERIOR APPROACH;  Surgeon: Rod Can, MD;  [facility];  Service: Orthopedics;  Laterality: Left;  . UPPER GI ENDOSCOPY  01/07/2017    Family History  Problem Relation Age of Onset  . Colon cancer Father 31  . Colon cancer Brother 69  . Heart attack Brother   . Heart disease Mother   . Heart attack Mother   . Diabetes Sister   . Stroke Sister     Social History:  reports that he quit smoking about 19 years ago. He has a 80.00 pack-year smoking history. He has never used smokeless tobacco. He reports previous alcohol use. He reports that he does not use drugs.  Allergies: No Known Allergies  Medications:  Scheduled:  Continuous: . lactated ringers 20 mL/hr at 05/06/18 7209    Results for orders placed or performed during the hospital encounter of 05/06/18 (from the past 24 hour(s))  Glucose, capillary     Status: None   Collection Time: 05/06/18  6:46 AM  Result Value Ref Range   Glucose-Capillary 96 70 - 99 mg/dL     No results found.  ROS:  As stated above in the HPI otherwise negative.  Blood pressure (!) 155/88, pulse (!) 56, temperature  97.9 F (36.6 C), temperature source Oral, resp. rate 18, height 5\' 9"  (1.753 m), weight 93.4 kg.    PE: Gen: NAD, Alert and Oriented HEENT:  [place]/AT, EOMI Neck: Supple, no LAD Lungs: CTA Bilaterally CV: RRR without M/G/R ABM: Soft, NTND, +BS Ext: No C/C/E  Assessment/Plan: 1) SSC of the esophagus. 2) Weight loss. 3) Dysphagia.  Plan: 1) ERCP  Juan [name] D 05/06/2018, 7:11 AM

## 2018-05-06 NOTE — Op Note (Signed)
[facility] Patient Name: Juan [name] Procedure Date: 05/06/2018 MRN: [id number] Attending MD: Carol Ada , MD Date of Birth: 1940/08/17 CSN: [id number] Age: 78 Admit Type: Outpatient Procedure:                Upper GI endoscopy Indications:              Dysphagia Providers:                Carol Ada, MD, Dorise Hiss, RN, Carmie End, RN, Elspeth Cho Tech., Technician,                            Dion Saucier, CRNA Referring MD:              Medicines:                Propofol per Anesthesia Complications:            No immediate complications. Estimated Blood Loss:     Estimated blood loss: none. Procedure:                Pre-Anesthesia Assessment:                           - Prior to the procedure, a History and Physical                            was performed, and patient medications and                            allergies were reviewed. The patient's tolerance of                            previous anesthesia was also reviewed. The risks                            and benefits of the procedure and the sedation                            options and risks were discussed with the patient.                            All questions were answered, and informed consent                            was obtained. Prior Anticoagulants: The patient has                            taken no previous anticoagulant or antiplatelet                            agents. ASA Grade Assessment: III - A patient with                            severe systemic  disease. After reviewing the risks                            and benefits, the patient was deemed in                            satisfactory condition to undergo the procedure.                           - Sedation was administered by an anesthesia                            professional. Deep sedation was attained.                           After obtaining informed consent, the endoscope was                      passed under direct vision. Throughout the                            procedure, the patient's blood pressure, pulse, and                            oxygen saturations were monitored continuously. The                            [id number]-H190 ([id number] Olympus gastroscope was                            introduced through the mouth, and advanced to the                            second part of duodenum. The upper GI endoscopy was                            accomplished without difficulty. The patient                            tolerated the procedure well. Scope In: Scope Out: Findings:      One benign-appearing, intrinsic mild stenosis was found 25 cm from the       incisors. This stenosis measured 1.4 cm (inner diameter) x 1 cm (in       length). The stenosis was traversed. A TTS dilator was passed through       the scope. Dilation with a 12-13.5-15 mm balloon and a 15-16.5-18 mm       balloon dilator was performed to 16.5 mm. The dilation site was examined       and showed no change. Estimated blood loss: none.      The stomach was normal.      The examined duodenum was normal.      At 25 cm there was a mild stenosis and scarring from radiation treatment       of his prior SCC. There was no evidence of any luminal disease and the       rest  of the esophagus was normal. Dilation was performed using the       dilation balloon at 12 mm and then increased to 15 mm. At all three       diameters there was free movement of the balloon. The 16.5 mm balloon       was then used and there was again free movement of the balloon. No       further dilation was performed as the esophagus appeared to be compliant. Impression:               - Benign-appearing esophageal stenosis. Dilated.                           - Normal stomach.                           - Normal examined duodenum.                           - No specimens collected. Moderate Sedation:      Not Applicable - Patient had  care per Anesthesia. Recommendation:           - Patient has a contact number available for                            emergencies. The signs and symptoms of potential                            delayed complications were discussed with the                            patient. Return to normal activities tomorrow.                            Written discharge instructions were provided to the                            patient.                           - Resume previous diet.                           - Continue present medications.                           - Chew food well. Procedure Code(s):        --- Professional ---                           [id number], Esophagogastroduodenoscopy, flexible,                            transoral; with transendoscopic balloon dilation of                            esophagus (less than 30 mm diameter) Diagnosis Code(s):        --- Professional ---  K22.2, Esophageal obstruction                           R13.10, Dysphagia, unspecified CPT copyright 2018 [employer]. All rights reserved. The codes documented in this report are preliminary and upon coder review may  be revised to meet current compliance requirements. Carol Ada, MD Carol Ada, MD 05/06/2018 7:48:10 AM This report has been signed electronically. Number of Addenda: 0

## 2018-05-06 NOTE — Transfer of Care (Signed)
Immediate Anesthesia Transfer of Care Note  Patient: Juan [name]  Procedure(s) Performed: ESOPHAGOGASTRODUODENOSCOPY (EGD) WITH PROPOFOL (N/A ) BALLOON DILATION (N/A )  Patient Location: PACU and Endoscopy Unit  Anesthesia Type:MAC  Level of Consciousness: awake, oriented, drowsy and patient cooperative  Airway & Oxygen Therapy: Patient Spontanous Breathing and Patient connected to nasal cannula oxygen  Post-op Assessment: Report given to RN, Post -op Vital signs reviewed and stable and Patient moving all extremities  Post vital signs: Reviewed and stable  Last Vitals:  Vitals Value Taken Time  BP 129/81 05/06/2018  7:50 AM  Temp 36.4 C 05/06/2018  7:48 AM  Pulse 57 05/06/2018  7:56 AM  Resp 14 05/06/2018  7:56 AM  SpO2 94 % 05/06/2018  7:56 AM  Vitals shown include unvalidated device data.  Last Pain:  Vitals:   05/06/18 0748  TempSrc: Oral  PainSc: 0-No pain         Complications: No apparent anesthesia complications

## 2018-05-06 NOTE — Anesthesia Postprocedure Evaluation (Signed)
Anesthesia Post Note  Patient: Juan [name]  Procedure(s) Performed: ESOPHAGOGASTRODUODENOSCOPY (EGD) WITH PROPOFOL (N/A ) BALLOON DILATION (N/A )     Patient location during evaluation: Endoscopy Anesthesia Type: MAC Level of consciousness: awake and sedated Pain management: pain level controlled Vital Signs Assessment: post-procedure vital signs reviewed and stable Respiratory status: spontaneous breathing Cardiovascular status: stable Postop Assessment: no apparent nausea or vomiting Anesthetic complications: no    Last Vitals:  Vitals:   05/06/18 0650 05/06/18 0748  BP: (!) 155/88 132/82  Pulse: (!) 56 (!) 54  Resp: 18 15  Temp: 36.6 C 36.4 C  SpO2:  100%    Last Pain:  Vitals:   05/06/18 0748  TempSrc: Oral  PainSc: 0-No pain   Pain Goal:                   Huston Foley

## 2018-05-09 ENCOUNTER — Encounter (HOSPITAL_COMMUNITY): Payer: Self-pay | Admitting: Gastroenterology

## 2018-05-18 ENCOUNTER — Inpatient Hospital Stay: Payer: 59 | Attending: Hematology

## 2018-05-18 ENCOUNTER — Ambulatory Visit (HOSPITAL_COMMUNITY)
Admission: RE | Admit: 2018-05-18 | Discharge: 2018-05-18 | Disposition: A | Discharge status: Home / Self Care | Payer: 59 | Source: Ambulatory Visit | Attending: Hematology | Admitting: Hematology

## 2018-05-18 DIAGNOSIS — C154 Malignant neoplasm of middle third of esophagus: Secondary | ICD-10-CM

## 2018-05-18 DIAGNOSIS — Z8501 Personal history of malignant neoplasm of esophagus: Secondary | ICD-10-CM | POA: Insufficient documentation

## 2018-05-18 LAB — COMPREHENSIVE METABOLIC PANEL
ALT: 9 U/L (ref 0–44)
AST: 18 U/L (ref 15–41)
Albumin: 3.9 g/dL (ref 3.5–5.0)
Alkaline Phosphatase: 93 U/L (ref 38–126)
Anion gap: 10 (ref 5–15)
BUN: 10 mg/dL (ref 8–23)
CO2: 23 mmol/L (ref 22–32)
Calcium: 8.5 mg/dL — ABNORMAL LOW (ref 8.9–10.3)
Chloride: 109 mmol/L (ref 98–111)
Creatinine, Ser: 0.86 mg/dL (ref 0.61–1.24)
GFR calc Af Amer: >60 mL/min (ref >60)
GFR calc non Af Amer: >60 mL/min (ref >60)
Glucose, Bld: 86 mg/dL (ref 70–99)
Potassium: 3.8 mmol/L (ref 3.5–5.1)
Sodium: 142 mmol/L (ref 135–145)
Total Bilirubin: 0.6 mg/dL (ref 0.3–1.2)
Total Protein: 7.2 g/dL (ref 6.5–8.1)

## 2018-05-18 LAB — CBC WITH DIFFERENTIAL/PLATELET
Abs Immature Granulocytes: 0.01 K/uL (ref 0.00–0.07)
Basophils Absolute: 0 K/uL (ref 0.0–0.1)
Basophils Relative: 1 %
Eosinophils Absolute: 0.1 K/uL (ref 0.0–0.5)
Eosinophils Relative: 3 %
HCT: 40.9 % (ref 39.0–52.0)
Hemoglobin: 12.6 g/dL — ABNORMAL LOW (ref 13.0–17.0)
Immature Granulocytes: 0 %
Lymphocytes Relative: 35 %
Lymphs Abs: 1.4 K/uL (ref 0.7–4.0)
MCH: 22.7 pg — ABNORMAL LOW (ref 26.0–34.0)
MCHC: 30.8 g/dL (ref 30.0–36.0)
MCV: 73.6 fL — ABNORMAL LOW (ref 80.0–100.0)
Monocytes Absolute: 0.4 K/uL (ref 0.1–1.0)
Monocytes Relative: 11 %
Neutro Abs: 1.9 K/uL (ref 1.7–7.7)
Neutrophils Relative %: 50 %
Platelets: 260 K/uL (ref 150–400)
RBC: 5.56 MIL/uL (ref 4.22–5.81)
RDW: 15.6 % — ABNORMAL HIGH (ref 11.5–15.5)
WBC: 3.9 K/uL — ABNORMAL LOW (ref 4.0–10.5)
nRBC: 0 % (ref 0.0–0.2)

## 2018-05-20 ENCOUNTER — Inpatient Hospital Stay: Payer: 59 | Admitting: Hematology

## 2018-06-03 ENCOUNTER — Emergency Department (HOSPITAL_COMMUNITY)
Admission: EM | Admit: 2018-06-03 | Discharge: 2018-06-03 | Disposition: A | Discharge status: Home / Self Care | Payer: 59 | Attending: Emergency Medicine | Admitting: Emergency Medicine

## 2018-06-03 ENCOUNTER — Other Ambulatory Visit: Payer: Self-pay

## 2018-06-03 ENCOUNTER — Encounter (HOSPITAL_COMMUNITY): Payer: Self-pay | Admitting: Emergency Medicine

## 2018-06-03 DIAGNOSIS — J111 Influenza due to unidentified influenza virus with other respiratory manifestations: Secondary | ICD-10-CM | POA: Diagnosis not present

## 2018-06-03 DIAGNOSIS — E119 Type 2 diabetes mellitus without complications: Secondary | ICD-10-CM | POA: Insufficient documentation

## 2018-06-03 DIAGNOSIS — Z7984 Long term (current) use of oral hypoglycemic drugs: Secondary | ICD-10-CM | POA: Insufficient documentation

## 2018-06-03 DIAGNOSIS — Z87891 Personal history of nicotine dependence: Secondary | ICD-10-CM | POA: Diagnosis not present

## 2018-06-03 DIAGNOSIS — F4325 Adjustment disorder with mixed disturbance of emotions and conduct: Secondary | ICD-10-CM | POA: Diagnosis not present

## 2018-06-03 DIAGNOSIS — I2510 Atherosclerotic heart disease of native coronary artery without angina pectoris: Secondary | ICD-10-CM | POA: Insufficient documentation

## 2018-06-03 DIAGNOSIS — Z7982 Long term (current) use of aspirin: Secondary | ICD-10-CM | POA: Diagnosis not present

## 2018-06-03 DIAGNOSIS — R69 Illness, unspecified: Secondary | ICD-10-CM

## 2018-06-03 DIAGNOSIS — Z8501 Personal history of malignant neoplasm of esophagus: Secondary | ICD-10-CM | POA: Diagnosis not present

## 2018-06-03 DIAGNOSIS — Z96642 Presence of left artificial hip joint: Secondary | ICD-10-CM | POA: Diagnosis not present

## 2018-06-03 DIAGNOSIS — Z79899 Other long term (current) drug therapy: Secondary | ICD-10-CM | POA: Insufficient documentation

## 2018-06-03 DIAGNOSIS — R05 Cough: Secondary | ICD-10-CM | POA: Diagnosis present

## 2018-06-03 MED ORDER — BENZONATATE 100 MG PO CAPS: 100.0000 mg | ORAL_CAPSULE | Freq: Three times a day (TID) | ORAL | 0 refills | Status: DC

## 2018-06-03 NOTE — ED Provider Notes (Signed)
[facility] DEPT Provider Note   CSN: [id number] Arrival date & time: 06/03/18  1945    History   Chief Complaint Chief Complaint  Patient presents with  . Cough    HPI Bodie Abernethy is a 78 y.o. male.     78 yo M with a chief complaint of cough congestion and subjective fevers and chills.  This been going on for the past 3 days.  Has had multiple sick contacts at work with similar illness.  Having nausea and vomiting as well.  Denies abdominal pain denies shortness of breath denies confusion.  The history is provided by the patient.  Cough  Associated symptoms: chills, fever and myalgias   Associated symptoms: no chest pain, no eye discharge, no headaches, no rash and no shortness of breath   Illness  Severity:  Moderate Onset quality:  Gradual Duration:  2 days Timing:  Constant Progression:  Unchanged Chronicity:  New Associated symptoms: congestion, cough, fever, myalgias, nausea and vomiting   Associated symptoms: no abdominal pain, no chest pain, no diarrhea, no headaches, no rash and no shortness of breath     Past Medical History:  Diagnosis Date  . Aortic atherosclerosis ([place])   . Back pain   . Barrett esophagus   . Bleeding nose   . DDD (degenerative disc disease), thoracolumbar    Moderate to severe  . Diabetes mellitus without complication ([place])   . Diverticulosis   . Esophageal cancer ([place]) dx'd 2017  . GERD (gastroesophageal reflux disease)   . Headache   . Hearing loss    Right ear  . History of umbilical hernia repair   . Idiopathic peripheral neuropathy   . Iron deficiency anemia   . LBBB (left bundle branch block) 02/11/2016  . Left hip pain    Severe degenerative changes  . Lipoma of neck    Right  . Mixed hyperlipidemia   . PONV (postoperative nausea and vomiting)   . Reflux   . Right thyroid nodule     Patient Active Problem List   Diagnosis Date Noted  . Osteoarthritis of left hip 07/22/2017  . Iron  deficiency anemia 05/17/2017  . LBBB (left bundle branch block) 02/11/2016  . Port catheter in place 12/25/2015  . 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 Esophageal cancer ([place]) 11/22/2015  . Noise effect on both inner ears 08/29/2015  . Right thyroid nodule 08/29/2015    Past Surgical History:  Procedure Laterality Date  . BALLOON DILATION N/A 05/06/2018   Procedure: BALLOON DILATION;  Surgeon: Carol Ada, MD;  Location: [facility];  Service: Endoscopy;  Laterality: N/A;  . COLONOSCOPY    . ESOPHAGOGASTRODUODENOSCOPY (EGD) WITH PROPOFOL N/A 05/06/2018   Procedure: ESOPHAGOGASTRODUODENOSCOPY (EGD) WITH PROPOFOL;  Surgeon: Carol Ada, MD;  Location: [facility];  Service: Endoscopy;  Laterality: N/A;  . EUS N/A 12/05/2015   Procedure: UPPER ENDOSCOPIC ULTRASOUND (EUS) LINEAR;  Surgeon: Carol Ada, MD;  Location: [facility];  Service: Endoscopy;  Laterality: N/A;  . EUS N/A 03/10/2016   Procedure: UPPER ENDOSCOPIC ULTRASOUND (EUS) LINEAR;  Surgeon: Carol Ada, MD;  Location: [facility];  Service: Endoscopy;  Laterality: N/A;  . EUS N/A 01/07/2017   Procedure: UPPER ENDOSCOPIC ULTRASOUND (EUS) LINEAR;  Surgeon: Carol Ada, MD;  Location: [facility];  Service: Endoscopy;  Laterality: N/A;  . HERNIA REPAIR    . IR GENERIC HISTORICAL  12/24/2015   IR FLUORO GUIDE PORT INSERTION RIGHT 12/24/2015 Arne Cleveland,  MD [facility]  . IR GENERIC HISTORICAL  12/24/2015   IR US GUIDE VASC ACCESS RIGHT 12/24/2015 Arne Cleveland, MD [facility]  . IR REMOVAL TUN ACCESS W/ PORT W/O FL MOD SED  02/07/2018  . PORTA CATH INSERTION    . SHOULDER ARTHROSCOPY W/ SUPERIOR LABRAL ANTERIOR POSTERIOR LESION REPAIR     times 2  . TOTAL HIP ARTHROPLASTY Left 07/22/2017   Procedure: LEFT TOTAL HIP ARTHROPLASTY ANTERIOR APPROACH;  Surgeon: Rod Can, MD;  Location: [facility];  Service: Orthopedics;  Laterality: Left;  . UPPER GI ENDOSCOPY  01/07/2017         Home Medications    Prior to Admission medications   Medication Sig Start Date End Date Taking? Authorizing Provider  amitriptyline (ELAVIL) 10 MG tablet Take 10 mg by mouth at bedtime. 03/11/18  Yes [provider]  aspirin EC 81 MG tablet Take 81 mg by mouth daily.   Yes [provider]  celecoxib (CELEBREX) 200 MG capsule Take 200 mg by mouth daily. 04/26/18  Yes [provider]  gabapentin (NEURONTIN) 300 MG capsule Take 300 mg by mouth 2 (two) times daily.   Yes [provider]  glimepiride (AMARYL) 1 MG tablet Take 1 mg by mouth daily after breakfast.  02/22/18  Yes [provider]  LIPITOR 20 MG tablet Take 20 mg by mouth every evening. 02/21/18  Yes [provider]  pantoprazole (PROTONIX) 40 MG tablet Take 40 mg by mouth daily. 02/21/18  Yes [provider]  PERCOCET 10-325 MG tablet Take 1 tablet by mouth 3 (three) times daily as needed for pain. 04/26/18  Yes [provider]  tiZANidine (ZANAFLEX) 2 MG tablet Take 2 mg by mouth 2 (two) times daily as needed for muscle spasms. 02/22/18  Yes [provider]  benzonatate (TESSALON) 100 MG capsule Take 1 capsule (100 mg total) by mouth every 8 (eight) hours. 06/03/18   Deno Etienne, DO    Family History Family History  Problem Relation Age of Onset  . Colon cancer Father 74  . Colon cancer Brother 55  . Heart attack Brother   . Heart disease Mother   . Heart attack Mother   . Diabetes Sister   . Stroke Sister     Social History Social History   Tobacco Use  . Smoking status: Former Smoker    Packs/day: 2.00    Years: 40.00    Pack years: 80.00    Last attempt to quit: 03/31/1999    Years since quitting: 19.1  . Smokeless tobacco: Never Used  Substance Use Topics  . Alcohol use: Not Currently    Comment: weekend binge drinking for 30-40 years, quit in 2001  . Drug use: No     Allergies   Patient has no known  allergies.   Review of Systems Review of Systems  Constitutional: Positive for chills and fever.  HENT: Positive for congestion. Negative for facial swelling.   Eyes: Negative for discharge and visual disturbance.  Respiratory: Positive for cough. Negative for shortness of breath.   Cardiovascular: Negative for chest pain and palpitations.  Gastrointestinal: Positive for nausea and vomiting. Negative for abdominal pain and diarrhea.  Musculoskeletal: Positive for myalgias. Negative for arthralgias.  Skin: Negative for color change and rash.  Neurological: Negative for tremors, syncope and headaches.  Psychiatric/Behavioral: Negative for confusion and dysphoric mood.     Physical Exam Updated Vital Signs BP (!) 152/91   Pulse 65   Temp 97.9  F (36.6 C) (Oral)   Resp 15   SpO2 95%   Physical Exam Vitals signs and nursing note reviewed.  Constitutional:      Appearance: He is well-developed.  HENT:     Head: Normocephalic and atraumatic.     Comments: Swollen turbinates, posterior nasal drip, no noted sinus ttp, tm normal bilaterally.   Eyes:     Pupils: Pupils are equal, round, and reactive to light.  Neck:     Musculoskeletal: Normal range of motion and neck supple.     Vascular: No JVD.  Cardiovascular:     Rate and Rhythm: Normal rate and regular rhythm.     Heart sounds: No murmur. No friction rub. No gallop.   Pulmonary:     Effort: No respiratory distress.     Breath sounds: No wheezing.  Abdominal:     General: There is no distension.     Tenderness: There is no guarding or rebound.  Musculoskeletal: Normal range of motion.  Skin:    Coloration: Skin is not pale.     Findings: No rash.  Neurological:     Mental Status: He is alert and oriented to person, place, and time.  Psychiatric:        Behavior: Behavior normal.      ED Treatments / Results  Labs (all labs ordered are listed, but only abnormal results are displayed) Labs Reviewed - No data to  display  EKG None  Radiology No results found.  Procedures Procedures (including critical care time)  Medications Ordered in ED Medications - No data to display   Initial Impression / Assessment and Plan / ED Course  I have reviewed the triage vital signs and the nursing notes.  Pertinent labs & imaging results that were available during my care of the patient were reviewed by me and considered in my medical decision making (see chart for details).        78 yo M with a chief complaint of an influenza-like illness.  The patient is well-appearing and nontoxic.  He is clear lung sounds on my exam.  No other bacterial source was found.  Will discharge patient home.  PCP follow-up.  9:41 PM:  I have discussed the diagnosis/risks/treatment options with the patient and believe the pt to be eligible for discharge home to follow-up with PCP. We also discussed returning to the ED immediately if new or worsening sx occur. We discussed the sx which are most concerning (e.g., sudden worsening pain, fever, inability to tolerate by mouth) that necessitate immediate return. Medications administered to the patient during their visit and any new prescriptions provided to the patient are listed below.  Medications given during this visit Medications - No data to display   The patient appears reasonably screen and/or stabilized for discharge and I doubt any other medical condition or other [facility] requiring further screening, evaluation, or treatment in the ED at this time prior to discharge.    Final Clinical Impressions(s) / ED Diagnoses   Final diagnoses:  Influenza-like illness    ED Discharge Orders         Ordered    benzonatate (TESSALON) 100 MG capsule  Every 8 hours     06/03/18 2132           Deno Etienne, DO 06/03/18 2141

## 2018-06-03 NOTE — Discharge Instructions (Signed)
Take tylenol 2 pills 4 times a day and if your doctor has told you its ok to take motrin 4 pills 3 times a day.  Drink plenty of fluids.  Return for worsening shortness of breath, headache, confusion. Follow up with your family doctor.

## 2018-06-03 NOTE — ED Triage Notes (Addendum)
Patient c/o cough, congestion, sneezing, body aches, and fevers x2 days. Denies V/D.

## 2018-07-02 ENCOUNTER — Emergency Department (HOSPITAL_COMMUNITY)
Admission: EM | Admit: 2018-07-02 | Discharge: 2018-07-02 | Disposition: A | Discharge status: Home / Self Care | Payer: 59 | Attending: Emergency Medicine | Admitting: Emergency Medicine

## 2018-07-02 ENCOUNTER — Other Ambulatory Visit: Payer: Self-pay

## 2018-07-02 ENCOUNTER — Emergency Department (HOSPITAL_COMMUNITY): Payer: 59

## 2018-07-02 DIAGNOSIS — Z79899 Other long term (current) drug therapy: Secondary | ICD-10-CM | POA: Insufficient documentation

## 2018-07-02 DIAGNOSIS — E119 Type 2 diabetes mellitus without complications: Secondary | ICD-10-CM | POA: Insufficient documentation

## 2018-07-02 DIAGNOSIS — R0602 Shortness of breath: Secondary | ICD-10-CM | POA: Insufficient documentation

## 2018-07-02 DIAGNOSIS — Z87891 Personal history of nicotine dependence: Secondary | ICD-10-CM | POA: Insufficient documentation

## 2018-07-02 DIAGNOSIS — Z7982 Long term (current) use of aspirin: Secondary | ICD-10-CM | POA: Diagnosis not present

## 2018-07-02 LAB — TROPONIN I
Troponin I: <0.03 ng/mL (ref <0.03)
Troponin I: <0.03 ng/mL (ref <0.03)

## 2018-07-02 LAB — COMPREHENSIVE METABOLIC PANEL
ALT: 17 U/L (ref 0–44)
AST: 25 U/L (ref 15–41)
Albumin: 3.8 g/dL (ref 3.5–5.0)
Alkaline Phosphatase: 96 U/L (ref 38–126)
Anion gap: 9 (ref 5–15)
BUN: 6 mg/dL — ABNORMAL LOW (ref 8–23)
CO2: 25 mmol/L (ref 22–32)
Calcium: 8.7 mg/dL — ABNORMAL LOW (ref 8.9–10.3)
Chloride: 105 mmol/L (ref 98–111)
Creatinine, Ser: 1.01 mg/dL (ref 0.61–1.24)
GFR calc Af Amer: >60 mL/min (ref >60)
GFR calc non Af Amer: >60 mL/min (ref >60)
Glucose, Bld: 104 mg/dL — ABNORMAL HIGH (ref 70–99)
Potassium: 4.5 mmol/L (ref 3.5–5.1)
Sodium: 139 mmol/L (ref 135–145)
Total Bilirubin: 0.9 mg/dL (ref 0.3–1.2)
Total Protein: 7.1 g/dL (ref 6.5–8.1)

## 2018-07-02 LAB — CBC WITH DIFFERENTIAL/PLATELET
Abs Immature Granulocytes: 0.01 10*3/uL (ref 0.00–0.07)
Basophils Absolute: 0 10*3/uL (ref 0.0–0.1)
Basophils Relative: 1 %
Eosinophils Absolute: 0.1 10*3/uL (ref 0.0–0.5)
Eosinophils Relative: 3 %
HCT: 44.3 % (ref 39.0–52.0)
Hemoglobin: 12.7 g/dL — ABNORMAL LOW (ref 13.0–17.0)
Immature Granulocytes: 0 %
Lymphocytes Relative: 29 %
Lymphs Abs: 1 10*3/uL (ref 0.7–4.0)
MCH: 21.6 pg — ABNORMAL LOW (ref 26.0–34.0)
MCHC: 28.7 g/dL — ABNORMAL LOW (ref 30.0–36.0)
MCV: 75.5 fL — ABNORMAL LOW (ref 80.0–100.0)
Monocytes Absolute: 0.5 10*3/uL (ref 0.1–1.0)
Monocytes Relative: 13 %
Neutro Abs: 1.9 10*3/uL (ref 1.7–7.7)
Neutrophils Relative %: 54 %
Platelets: 307 10*3/uL (ref 150–400)
RBC: 5.87 MIL/uL — ABNORMAL HIGH (ref 4.22–5.81)
RDW: 17.1 % — ABNORMAL HIGH (ref 11.5–15.5)
WBC: 3.5 10*3/uL — ABNORMAL LOW (ref 4.0–10.5)
nRBC: 0 % (ref 0.0–0.2)

## 2018-07-02 LAB — LACTIC ACID, PLASMA: Lactic Acid, Venous: 2 mmol/L (ref 0.5–1.9)

## 2018-07-02 LAB — BRAIN NATRIURETIC PEPTIDE: B Natriuretic Peptide: 163.1 pg/mL — ABNORMAL HIGH (ref 0.0–100.0)

## 2018-07-02 LAB — D-DIMER, QUANTITATIVE: D-Dimer, Quant: 0.54 ug/mL-FEU — ABNORMAL HIGH (ref 0.00–0.50)

## 2018-07-02 MED ORDER — FUROSEMIDE 20 MG PO TABS: 20.0000 mg | ORAL_TABLET | Freq: Every day | ORAL | 0 refills | Status: DC

## 2018-07-02 MED ORDER — IOHEXOL 350 MG/ML SOLN
75.0000 mL | Freq: Once | INTRAVENOUS | Status: AC | PRN
  Administered 2018-07-02: 75 mL via INTRAVENOUS

## 2018-07-02 MED ORDER — DOXYCYCLINE HYCLATE 100 MG PO CAPS: 100.0000 mg | ORAL_CAPSULE | Freq: Two times a day (BID) | ORAL | 0 refills | Status: DC

## 2018-07-02 NOTE — ED Provider Notes (Signed)
Signout from Dr. Ellender Hose.  78 year old male with recent fever here we are with chest congestion and cough. Physical Exam  BP 140/90 (BP Location: Right Arm)   Pulse 60   Temp 98.1 F (36.7 C) (Oral)   Resp 17   Ht 5\' 9"  (1.753 m)   Wt 93.9 kg   SpO2 97%   BMI 30.57 kg/m   Physical Exam  ED Course/Procedures   Clinical Course as of Jul 02 1033  Sat Jul 02, 2018  1745 CT showing no evidence of PE.  Does demonstrate mild cardiomegaly with some edema.  Pneumonia not excluded.  Clinical correlation recommended no focal consolidation.  Will follow with plan for discharge and close follow-up with PCP.   [MB]    Clinical Course User Index [MB] [name], MD    Procedures  MDM  Plan is to follow-up on CT PE.  If negative for acute findings likely will discharge with symptomatic treatment.       [name], MD 07/03/18 [phone]

## 2018-07-02 NOTE — ED Provider Notes (Signed)
[facility] EMERGENCY DEPARTMENT Provider Note   CSN: [id number] Arrival date & time: 07/02/18  1158    History   Chief Complaint No chief complaint on file.   HPI Juan [name] is a 78 y.o. male.     HPI   78 yo M with PMHx esophageal CA, iron deficiency anemia, aortic atherosclerosis, DDD, DM, HTN, here with CP and SOB. Pt reports gradual onset of productive cough, general fatigue, poor appetite, and SOB w/ exertion x 1 week. He states he had a fever to 101-102 at beginning of sx, but this has since resolved. Over the past 3-4 days, he's had worsening cough, occasionally productive, as well as fatigue. No abd pain, n/v. No specific sick contacts. No travel outside of [place]. Does not recall any leg swleling. No chest pain. No alleviating factors other than rest. Has not taken any OTC meds.  Past Medical History:  Diagnosis Date   Aortic atherosclerosis (HCC)    Back pain    Barrett esophagus    Bleeding nose    DDD (degenerative disc disease), thoracolumbar    Moderate to severe   Diabetes mellitus without complication ([place])    Diverticulosis    Esophageal cancer ([place]) dx'd 2017   GERD (gastroesophageal reflux disease)    Headache    Hearing loss    Right ear   History of umbilical hernia repair    Idiopathic peripheral neuropathy    Iron deficiency anemia    LBBB (left bundle branch block) 02/11/2016   Left hip pain    Severe degenerative changes   Lipoma of neck    Right   Mixed hyperlipidemia    PONV (postoperative nausea and vomiting)    Reflux    Right thyroid nodule     Patient Active Problem List   Diagnosis Date Noted   Osteoarthritis of left hip 07/22/2017   Iron deficiency anemia 05/17/2017   LBBB (left bundle branch block) 02/11/2016   Port catheter in place 12/25/2015   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   Esophageal cancer ([place]) 11/22/2015   Noise effect on both  inner ears 08/29/2015   Right thyroid nodule 08/29/2015    Past Surgical History:  Procedure Laterality Date   BALLOON DILATION N/A 05/06/2018   Procedure: BALLOON DILATION;  Surgeon: Carol Ada, MD;  Location: [facility];  Service: Endoscopy;  Laterality: N/A;   COLONOSCOPY     ESOPHAGOGASTRODUODENOSCOPY (EGD) WITH PROPOFOL N/A 05/06/2018   Procedure: ESOPHAGOGASTRODUODENOSCOPY (EGD) WITH PROPOFOL;  Surgeon: Carol Ada, MD;  Location: [facility];  Service: Endoscopy;  Laterality: N/A;   EUS N/A 12/05/2015   Procedure: UPPER ENDOSCOPIC ULTRASOUND (EUS) LINEAR;  Surgeon: Carol Ada, MD;  Location: [facility];  Service: Endoscopy;  Laterality: N/A;   EUS N/A 03/10/2016   Procedure: UPPER ENDOSCOPIC ULTRASOUND (EUS) LINEAR;  Surgeon: Carol Ada, MD;  Location: [facility];  Service: Endoscopy;  Laterality: N/A;   EUS N/A 01/07/2017   Procedure: UPPER ENDOSCOPIC ULTRASOUND (EUS) LINEAR;  Surgeon: Carol Ada, MD;  Location: [facility];  Service: Endoscopy;  Laterality: N/A;   HERNIA REPAIR     IR GENERIC HISTORICAL  12/24/2015   IR FLUORO GUIDE PORT INSERTION RIGHT 12/24/2015 Arne Cleveland, MD [facility]   IR GENERIC HISTORICAL  12/24/2015   IR US GUIDE VASC ACCESS RIGHT 12/24/2015 Arne Cleveland, MD [facility]   IR REMOVAL TUN ACCESS W/ PORT W/O FL MOD SED  02/07/2018   PORTA CATH INSERTION  SHOULDER ARTHROSCOPY W/ SUPERIOR LABRAL ANTERIOR POSTERIOR LESION REPAIR     times 2   TOTAL HIP ARTHROPLASTY Left 07/22/2017   Procedure: LEFT TOTAL HIP ARTHROPLASTY ANTERIOR APPROACH;  Surgeon: Rod Can, MD;  Location: [facility];  Service: Orthopedics;  Laterality: Left;   UPPER GI ENDOSCOPY  01/07/2017        Home Medications    Prior to Admission medications   Medication Sig Start Date End Date Taking? Authorizing Provider  amitriptyline (ELAVIL) 10 MG tablet Take 10 mg by mouth at bedtime. 03/11/18  Yes [provider]  aspirin EC 81 MG  tablet Take 81 mg by mouth daily.   Yes [provider]  celecoxib (CELEBREX) 200 MG capsule Take 200 mg by mouth daily. 04/26/18  Yes [provider]  gabapentin (NEURONTIN) 300 MG capsule Take 300 mg by mouth 2 (two) times daily.   Yes [provider]  glimepiride (AMARYL) 1 MG tablet Take 1 mg by mouth daily after breakfast.  02/22/18  Yes [provider]  LIPITOR 20 MG tablet Take 20 mg by mouth every evening. 02/21/18  Yes [provider]  pantoprazole (PROTONIX) 40 MG tablet Take 40 mg by mouth daily. 02/21/18  Yes [provider]  PERCOCET 10-325 MG tablet Take 1 tablet by mouth 3 (three) times daily as needed for pain. 04/26/18  Yes [provider]  tiZANidine (ZANAFLEX) 2 MG tablet Take 2 mg by mouth 2 (two) times daily as needed for muscle spasms. 02/22/18  Yes [provider]  benzonatate (TESSALON) 100 MG capsule Take 1 capsule (100 mg total) by mouth every 8 (eight) hours. Patient not taking: Reported on 07/02/2018 06/03/18   Deno Etienne, DO  doxycycline (VIBRAMYCIN) 100 MG capsule Take 1 capsule (100 mg total) by mouth 2 (two) times daily. 07/02/18   [name], MD  furosemide (LASIX) 20 MG tablet Take 1 tablet (20 mg total) by mouth daily. 07/02/18   [name], MD    Family History Family History  Problem Relation Age of Onset   Colon cancer Father 58   Colon cancer Brother 31   Heart attack Brother    Heart disease Mother    Heart attack Mother    Diabetes Sister    Stroke Sister     Social History Social History   Tobacco Use   Smoking status: Former Smoker    Packs/day: 2.00    Years: 40.00    Pack years: 80.00    Last attempt to quit: 03/31/1999    Years since quitting: 19.2   Smokeless tobacco: Never Used  Substance Use Topics   Alcohol use: Not Currently    Comment: weekend binge drinking for 30-40 years, quit in 2001   Drug use: No     Allergies   Patient has no  known allergies.   Review of Systems Review of Systems  Constitutional: Positive for fatigue. Negative for chills and fever.  HENT: Negative for congestion and rhinorrhea.   Eyes: Negative for visual disturbance.  Respiratory: Positive for cough and shortness of breath.   Cardiovascular: Negative for chest pain and leg swelling.  Gastrointestinal: Negative for abdominal pain, diarrhea, nausea and vomiting.  Genitourinary: Negative for dysuria and flank pain.  Musculoskeletal: Negative for neck pain and neck stiffness.  Skin: Negative for rash and wound.  Allergic/Immunologic: Negative for immunocompromised state.  Neurological: Positive for weakness. Negative for syncope and headaches.  All other systems reviewed and are negative.  Physical Exam Updated Vital Signs BP (!) 151/87    Pulse (!) 59    Temp 98.1 F (36.7 C) (Oral)    Resp 19    Ht 5\' 9"  (1.753 m)    Wt 93.9 kg    SpO2 95%    BMI 30.57 kg/m   Physical Exam Vitals signs and nursing note reviewed.  Constitutional:      General: He is not in acute distress.    Appearance: He is well-developed.  HENT:     Head: Normocephalic and atraumatic.  Eyes:     Conjunctiva/sclera: Conjunctivae normal.  Neck:     Musculoskeletal: Neck supple.  Cardiovascular:     Rate and Rhythm: Normal rate and regular rhythm.     Heart sounds: Normal heart sounds. No murmur. No friction rub.  Pulmonary:     Effort: Pulmonary effort is normal. No respiratory distress.     Breath sounds: Decreased breath sounds present. No wheezing or rales.  Abdominal:     General: There is no distension.     Palpations: Abdomen is soft.     Tenderness: There is no abdominal tenderness.  Skin:    General: Skin is warm.     Capillary Refill: Capillary refill takes less than 2 seconds.  Neurological:     Mental Status: He is alert and oriented to person, place, and time.     Motor: No abnormal muscle tone.      ED Treatments / Results   Labs (all labs ordered are listed, but only abnormal results are displayed) Labs Reviewed  D-DIMER, QUANTITATIVE (NOT AT [facility]) - Abnormal; Notable for the following components:      Result Value   D-Dimer, Quant 0.54 (*)    All other components within normal limits  CBC WITH DIFFERENTIAL/PLATELET - Abnormal; Notable for the following components:   WBC 3.5 (*)    RBC 5.87 (*)    Hemoglobin 12.7 (*)    MCV 75.5 (*)    MCH 21.6 (*)    MCHC 28.7 (*)    RDW 17.1 (*)    All other components within normal limits  COMPREHENSIVE METABOLIC PANEL - Abnormal; Notable for the following components:   Glucose, Bld 104 (*)    BUN 6 (*)    Calcium 8.7 (*)    All other components within normal limits  LACTIC ACID, PLASMA - Abnormal; Notable for the following components:   Lactic Acid, Venous 2.0 (*)    All other components within normal limits  BRAIN NATRIURETIC PEPTIDE - Abnormal; Notable for the following components:   B Natriuretic Peptide 163.1 (*)    All other components within normal limits  TROPONIN I  TROPONIN I  LACTIC ACID, PLASMA    EKG EKG Interpretation  Date/Time:  Saturday July 02 2018 12:11:32 EDT Ventricular Rate:  57 PR Interval:    QRS Duration: 158 QT Interval:  476 QTC Calculation: 464 R Axis:   -44 Text Interpretation:  Sinus rhythm Atrial premature complexes Short PR interval Probable left atrial enlargement Left bundle branch block No significant change since last tracing Confirmed by Duffy Bruce [phone]) on 07/02/2018 12:20:30 PM   Radiology Ct Angio Chest Pe W Or Wo Contrast  Result Date: 07/02/2018 CLINICAL DATA:  78 year old male with cough and body aches. Concern for pulmonary embolism. EXAM: CT ANGIOGRAPHY CHEST WITH CONTRAST TECHNIQUE: Multidetector CT imaging of the chest was performed using the standard protocol during bolus administration of intravenous contrast. Multiplanar CT image reconstructions  and MIPs were obtained to evaluate the vascular  anatomy. CONTRAST:  4mL OMNIPAQUE IOHEXOL 350 MG/ML SOLN COMPARISON:  Chest radiograph dated 07/02/2018 and CT dated 05/18/2018 FINDINGS: Evaluation of this exam is limited due to respiratory motion artifact. Cardiovascular: There is mild cardiomegaly. No pericardial effusion. The unopacified thoracic aorta is unremarkable. There is no CT evidence of pulmonary embolism. Mediastinum/Nodes: Mildly enlarged right hilar lymph node measures 16 mm in short axis. Left hilar lymph node measures 15 mm in short axis. The esophagus is grossly unremarkable. No mediastinal fluid collection. Lungs/Pleura: There is diffuse interstitial prominence and hazy airspace density which may rep edema. Atypical pneumonia is not excluded. There is no focal consolidation, pleural effusion, or pneumothorax. The central airways are patent. Upper Abdomen: No acute abnormality. Musculoskeletal: Degenerative changes of the spine. No acute osseous pathology. Review of the MIP images confirms the above findings. IMPRESSION: 1. No CT evidence of pulmonary embolism. 2. Mild cardiomegaly with edema. Pneumonia is not excluded. Clinical correlation is recommended. No focal consolidation. Electronically Signed   By: Anner Crete M.D.   On: 07/02/2018 17:41   Dg Chest Portable 1 View  Result Date: 07/02/2018 CLINICAL DATA:  Cough, body aches, and fevers for 2 days. EXAM: PORTABLE CHEST 1 VIEW COMPARISON:  08/18/2016 FINDINGS: The heart size and mediastinal contours are within normal limits. Both lungs are clear. The visualized skeletal structures are unremarkable. IMPRESSION: No active disease. Electronically Signed   By: Earle Gell M.D.   On: 07/02/2018 13:29    Procedures Procedures (including critical care time)  Medications Ordered in ED Medications  iohexol (OMNIPAQUE) 350 MG/ML injection 75 mL (75 mLs Intravenous Contrast Given 07/02/18 1725)     Initial Impression / Assessment and Plan / ED Course  I have reviewed the triage  vital signs and the nursing notes.  Pertinent labs & imaging results that were available during my care of the patient were reviewed by me and considered in my medical decision making (see chart for details).     78 year old male here with fairly atypical chest pain, shortness of breath, and subjective fevers.  I suspect this could be viral URI versus bronchitis.  Consideration additionally of coronavirus, though he is afebrile here.  Chest x-ray clear.  Lab work is overall reassuring.  Some mild leukopenia which is his baseline.  D-dimer minimally elevated, but given undifferentiated etiology of his shortness of breath, as well as history of cancer, will obtain CT for further assessment.  Lactic acid minimally elevated.  If CT neg, I'd plan on treating for possible bronchitis, outpt follow-up with COVID precautions.   Juan [name] was evaluated in Emergency Department on 07/02/2018 for the symptoms described in the history of present illness. He was evaluated in the context of the global COVID-19 pandemic, which necessitated consideration that the patient might be at risk for infection with the SARS-CoV-2 virus that causes COVID-19. Institutional protocols and algorithms that pertain to the evaluation of patients at risk for COVID-19 are in a state of rapid change based on information released by regulatory bodies including the CDC and federal and state organizations. These policies and algorithms were followed during the patient's care in the ED.  Final Clinical Impressions(s) / ED Diagnoses   Final diagnoses:  SOB (shortness of breath)    ED Discharge Orders         Ordered    doxycycline (VIBRAMYCIN) 100 MG capsule  2 times daily     07/02/18 1746    furosemide (  LASIX) 20 MG tablet  Daily     07/02/18 1746           Duffy Bruce, MD 07/02/18 1944

## 2018-07-02 NOTE — Discharge Instructions (Signed)
You were seen in the emergency department for shortness of breath and cough.  You had blood work EKG and a CAT scan of your chest.  The test showed that you were holding a little extra fluid in your lungs and there was possible signs of infection.  We are giving you 2 prescriptions.  One is an antibiotic to take twice a day for 10 days.  The other is a fluid pill that you will take in the mornings for 5 days.  This will make you urinate extra to try to pull some of the fluid off your lungs.  Will be important for you to contact your primary care doctor to review this with them.  Please return if any worsening symptoms.

## 2018-07-06 DIAGNOSIS — Z7189 Other specified counseling: Secondary | ICD-10-CM | POA: Diagnosis not present

## 2018-07-06 DIAGNOSIS — R079 Chest pain, unspecified: Secondary | ICD-10-CM | POA: Diagnosis not present

## 2018-07-06 DIAGNOSIS — J40 Bronchitis, not specified as acute or chronic: Secondary | ICD-10-CM | POA: Diagnosis not present

## 2018-07-06 DIAGNOSIS — R0602 Shortness of breath: Secondary | ICD-10-CM | POA: Diagnosis not present

## 2018-08-02 DIAGNOSIS — F1190 Opioid use, unspecified, uncomplicated: Secondary | ICD-10-CM | POA: Diagnosis not present

## 2018-08-02 DIAGNOSIS — E782 Mixed hyperlipidemia: Secondary | ICD-10-CM | POA: Diagnosis not present

## 2018-08-02 DIAGNOSIS — G609 Hereditary and idiopathic neuropathy, unspecified: Secondary | ICD-10-CM | POA: Diagnosis not present

## 2018-08-02 DIAGNOSIS — E1140 Type 2 diabetes mellitus with diabetic neuropathy, unspecified: Secondary | ICD-10-CM | POA: Diagnosis not present

## 2018-08-02 DIAGNOSIS — Z7189 Other specified counseling: Secondary | ICD-10-CM | POA: Diagnosis not present

## 2018-08-02 DIAGNOSIS — E1165 Type 2 diabetes mellitus with hyperglycemia: Secondary | ICD-10-CM | POA: Diagnosis not present

## 2018-08-02 DIAGNOSIS — J439 Emphysema, unspecified: Secondary | ICD-10-CM | POA: Diagnosis not present

## 2018-09-05 ENCOUNTER — Emergency Department (HOSPITAL_COMMUNITY): Payer: 59

## 2018-09-05 ENCOUNTER — Emergency Department (HOSPITAL_COMMUNITY)
Admission: EM | Admit: 2018-09-05 | Discharge: 2018-09-05 | Disposition: A | Discharge status: Home / Self Care | Payer: 59 | Attending: Emergency Medicine | Admitting: Emergency Medicine

## 2018-09-05 DIAGNOSIS — Z7982 Long term (current) use of aspirin: Secondary | ICD-10-CM | POA: Insufficient documentation

## 2018-09-05 DIAGNOSIS — Z87891 Personal history of nicotine dependence: Secondary | ICD-10-CM | POA: Insufficient documentation

## 2018-09-05 DIAGNOSIS — U071 COVID-19: Secondary | ICD-10-CM | POA: Diagnosis not present

## 2018-09-05 DIAGNOSIS — E119 Type 2 diabetes mellitus without complications: Secondary | ICD-10-CM | POA: Diagnosis not present

## 2018-09-05 DIAGNOSIS — R05 Cough: Secondary | ICD-10-CM | POA: Diagnosis not present

## 2018-09-05 DIAGNOSIS — I447 Left bundle-branch block, unspecified: Secondary | ICD-10-CM | POA: Diagnosis not present

## 2018-09-05 DIAGNOSIS — Z79899 Other long term (current) drug therapy: Secondary | ICD-10-CM | POA: Insufficient documentation

## 2018-09-05 DIAGNOSIS — R52 Pain, unspecified: Secondary | ICD-10-CM | POA: Diagnosis not present

## 2018-09-05 DIAGNOSIS — R0602 Shortness of breath: Secondary | ICD-10-CM | POA: Diagnosis not present

## 2018-09-05 DIAGNOSIS — R079 Chest pain, unspecified: Secondary | ICD-10-CM | POA: Diagnosis not present

## 2018-09-05 DIAGNOSIS — I499 Cardiac arrhythmia, unspecified: Secondary | ICD-10-CM | POA: Diagnosis not present

## 2018-09-05 DIAGNOSIS — I10 Essential (primary) hypertension: Secondary | ICD-10-CM | POA: Diagnosis not present

## 2018-09-05 LAB — BASIC METABOLIC PANEL
Anion gap: 13 (ref 5–15)
BUN: 10 mg/dL (ref 8–23)
CO2: 22 mmol/L (ref 22–32)
Calcium: 8.7 mg/dL — ABNORMAL LOW (ref 8.9–10.3)
Chloride: 106 mmol/L (ref 98–111)
Creatinine, Ser: 0.99 mg/dL (ref 0.61–1.24)
GFR calc Af Amer: >60 mL/min (ref >60)
GFR calc non Af Amer: >60 mL/min (ref >60)
Glucose, Bld: 105 mg/dL — ABNORMAL HIGH (ref 70–99)
Potassium: 3.7 mmol/L (ref 3.5–5.1)
Sodium: 141 mmol/L (ref 135–145)

## 2018-09-05 LAB — CBC WITH DIFFERENTIAL/PLATELET
Abs Immature Granulocytes: 0.02 10*3/uL (ref 0.00–0.07)
Basophils Absolute: 0 10*3/uL (ref 0.0–0.1)
Basophils Relative: 0 %
Eosinophils Absolute: 0 10*3/uL (ref 0.0–0.5)
Eosinophils Relative: 0 %
HCT: 44.1 % (ref 39.0–52.0)
Hemoglobin: 13.6 g/dL (ref 13.0–17.0)
Immature Granulocytes: 1 %
Lymphocytes Relative: 15 %
Lymphs Abs: 0.5 10*3/uL — ABNORMAL LOW (ref 0.7–4.0)
MCH: 22.9 pg — ABNORMAL LOW (ref 26.0–34.0)
MCHC: 30.8 g/dL (ref 30.0–36.0)
MCV: 74.4 fL — ABNORMAL LOW (ref 80.0–100.0)
Monocytes Absolute: 0.2 10*3/uL (ref 0.1–1.0)
Monocytes Relative: 6 %
Neutro Abs: 2.9 10*3/uL (ref 1.7–7.7)
Neutrophils Relative %: 78 %
Platelets: 273 10*3/uL (ref 150–400)
RBC: 5.93 MIL/uL — ABNORMAL HIGH (ref 4.22–5.81)
RDW: 14.8 % (ref 11.5–15.5)
WBC: 3.6 10*3/uL — ABNORMAL LOW (ref 4.0–10.5)
nRBC: 0 % (ref 0.0–0.2)

## 2018-09-05 LAB — TROPONIN I: Troponin I: <0.03 ng/mL (ref <0.03)

## 2018-09-05 LAB — MAGNESIUM: Magnesium: 1.8 mg/dL (ref 1.7–2.4)

## 2018-09-05 MED ORDER — ACETAMINOPHEN 500 MG PO TABS
1000.0000 mg | ORAL_TABLET | Freq: Once | ORAL | Status: AC
  Administered 2018-09-05: 1000 mg via ORAL
  Filled 2018-09-05: qty 2

## 2018-09-05 NOTE — ED Provider Notes (Signed)
Blood pressure (!) 151/96, pulse 68, temperature 98.2 F (36.8 C), temperature source Oral, resp. rate (!) 22, SpO2 95 %.  Assuming care from Dr. Regenia Skeeter and Carmon Sails PA-C.  In short, Juan [name] is a 78 y.o. male with a chief complaint of Shortness of Breath .  Refer to the original H&P for additional details.  The current plan of care is to follow up on labs.    EKG Interpretation  Date/Time:    Ventricular Rate:  157 PR Interval:    QRS Duration: 166 QT Interval:  239 QTC Calculation: 369 R Axis:   0 Text Interpretation:  Artifact in lead(s) I III aVR aVL aVF V5 Sinus rhythm. No STEMI. Similar to prior.  Confirmed by Nanda Quinton [phone]) on 09/05/2018 4:45:54 PM       04:44 PM  Called to speak with the patient regarding his lab work.  He has no significant findings.  He is ambulatory here with no hypoxemia.  Troponin is negative.  He has had constant discomfort in his chest for 2 weeks.  EKG reviewed with no acute ischemic changes.  Suspect that most of the patient symptoms are related to COVID-19 she tested positive for today.  We discussed home quarantine, social distancing with his family member at home, and ED return precautions in detail.  Patient states he is feeling comfortable with the plan at time of discharge and understands ED return precautions.  Will use Tylenol for fever.  [place] to call his PCP tomorrow to schedule a telehealth visit.   Aadil Sur was evaluated in Emergency Department on 09/05/2018 for the symptoms described in the history of present illness. He was evaluated in the context of the global COVID-19 pandemic, which necessitated consideration that the patient might be at risk for infection with the SARS-CoV-2 virus that causes COVID-19. Institutional protocols and algorithms that pertain to the evaluation of patients at risk for COVID-19 are in a state of rapid change based on information released by regulatory bodies including the CDC and federal and state  organizations. These policies and algorithms were followed during the patient's care in the ED.    Margette Fast, MD 09/05/18 [phone]

## 2018-09-05 NOTE — ED Notes (Signed)
Patient verbalizes understanding of discharge instructions. Opportunity for questioning and answers were provided. Armband removed by staff, pt discharged from ED.  

## 2018-09-05 NOTE — ED Notes (Signed)
Pt ambulated around room for 4 min. Pulse ox remained at 95% for most of the walk but dipped down to 92% right before sitting down. Pt reported feeling tired, but not short of breath or dizzy.

## 2018-09-05 NOTE — ED Triage Notes (Signed)
Pt arrived via [facility] from home. Notified of positive COVID-19 results by his pharmacy today. Pt c/o sob, cough, lethargy. EMS reported LBB, pt states he has been having centralized cp x2 weeks. EMS gave 324 ASA PTA. cbg 108, hr79, rr20, 97%ra.

## 2018-09-05 NOTE — Discharge Instructions (Addendum)
You were seen in the ED for COVID infection.  Work up in [place] was reassuring and everything looked normal. Your oxygen was normal during walking.  Treatment of your illness and symptoms will include self-isolation, monitoring of symptoms and supportive care with over-the-counter medicines.    Call your primary care doctor and arrange for a telemedicine follow up in 48-72 hours to ensure symptoms are improving.  Your doctor may direct you to seek further medical care if there are any concerning or new symptoms.   Return to the ED if there is increased work of breathing, shortness of breath, inability to tolerate fluids, weakness, chest pain, leg swelling or calf pain  If your test results are POSITIVE, the following isolation requirements need to be met to return to work and resume essential activities: At least 14 days since symptom onset  72 hours of absence of fever without antifever medicine (ibuprofen, acetaminophen). A fever is temperature of 100.57F or greater. Improvement of respiratory symptoms  Stay well-hydrated. Rest. You can use over the counter medications to help with symptoms: 600 mg ibuprofen (motrin, aleve, advil) or acetaminophen (tylenol) every 6 hours, around the clock to help with associated fevers, sore throat, headaches, generalized body aches and malaise.  Oxymetazoline (afrin) intranasal spray once daily for no more than 3 days to help with congestion, after 3 days you can switch to another over-the-counter nasal steroid spray such as fluticasone (flonase) Allergy medication (loratadine, cetirizine, etc) and phenylephrine (sudafed) help with nasal congestion, runny nose and postnasal drip.   Dextromethorphan (Delsym) to suppress dry cough. Frequent coughing is likely causing your chest wall pain Wash your hands often to prevent spread.    Infection Prevention Recommendations for Individuals Confirmed to have, or Being Evaluated for, or have symptoms of 2019 Novel  Coronavirus (COVID-19) Infection Who Receive Care at Home  Individuals who are confirmed to have, or are being evaluated for, COVID-19 should follow the prevention steps below until a healthcare provider or local or state health department says they can return to normal activities.  Stay home except to get medical care You should restrict activities outside your home, except for getting medical care. Do not go to work, school, or public areas, and do not use public transportation or taxis.  Call ahead before visiting your doctor Before your medical appointment, call the healthcare provider and tell them that you have, or are being evaluated for, COVID-19 infection. This will help the healthcare providers office take steps to keep other people from getting infected. Ask your healthcare provider to call the local or state health department.  Monitor your symptoms Seek prompt medical attention if your illness is worsening (e.g., difficulty breathing). Before going to your medical appointment, call the healthcare provider and tell them that you have, or are being evaluated for, COVID-19 infection. Ask your healthcare provider to call the local or state health department.  Wear a facemask You should wear a facemask that covers your nose and mouth when you are in the same room with other people and when you visit a healthcare provider. People who live with or visit you should also wear a facemask while they are in the same room with you.  Separate yourself from other people in your home As much as possible, you should stay in a different room from other people in your home. Also, you should use a separate bathroom, if available.  Avoid sharing household items You should not share dishes, drinking glasses, cups, eating utensils, towels,  bedding, or other items with other people in your home. After using these items, you should wash them thoroughly with soap and water.  Cover your coughs and  sneezes Cover your mouth and nose with a tissue when you cough or sneeze, or you can cough or sneeze into your sleeve. Throw used tissues in a lined trash can, and immediately wash your hands with soap and water for at least 20 seconds or use an alcohol-based hand rub.  Wash your [facility] your hands often and thoroughly with soap and water for at least 20 seconds. You can use an alcohol-based hand sanitizer if soap and water are not available and if your hands are not visibly dirty. Avoid touching your eyes, nose, and mouth with unwashed hands.   Prevention Steps for Caregivers and Household Members of Individuals Confirmed to have, or Being Evaluated for, or have symptoms of 2019 Novel Coronavirus (COVID-19) Infection Being Cared for in the Home  If you live with, or provide care at home for, a person confirmed to have, or being evaluated for, COVID-19 infection please follow these guidelines to prevent infection:  Follow healthcare providers instructions Make sure that you understand and can help the patient follow any healthcare provider instructions for all care.  Provide for the patients basic needs You should help the patient with basic needs in the home and provide support for getting groceries, prescriptions, and other personal needs.  Monitor the patients symptoms If they are getting sicker, call his or her medical provider and tell them that the patient has, or is being evaluated for, COVID-19 infection. This will help the healthcare providers office take steps to keep other people from getting infected. Ask the healthcare provider to call the local or state health department.  Limit the number of people who have contact with the patient If possible, have only one caregiver for the patient. Other household members should stay in another home or place of residence. If this is not possible, they should stay in another room, or be separated from the patient as much as possible.  Use a separate bathroom, if available. Restrict visitors who do not have an essential need to be in the home.  Keep older adults, very young children, and other sick people away from the patient Keep older adults, very young children, and those who have compromised immune systems or chronic health conditions away from the patient. This includes people with chronic heart, lung, or kidney conditions, diabetes, and cancer.  Ensure good ventilation Make sure that shared spaces in the home have good air flow, such as from an air conditioner or an opened window, weather permitting.  Wash your hands often Wash your hands often and thoroughly with soap and water for at least 20 seconds. You can use an alcohol based hand sanitizer if soap and water are not available and if your hands are not visibly dirty. Avoid touching your eyes, nose, and mouth with unwashed hands. Use disposable paper towels to dry your hands. If not available, use dedicated cloth towels and replace them when they become wet.  Wear a facemask and gloves Wear a disposable facemask at all times in the room and gloves when you touch or have contact with the patients blood, body fluids, and/or secretions or excretions, such as sweat, saliva, sputum, nasal mucus, vomit, urine, or feces.  Ensure the mask fits over your nose and mouth tightly, and do not touch it during use. Throw out disposable facemasks and gloves after  using them. Do not reuse. Wash your hands immediately after removing your facemask and gloves. If your personal clothing becomes contaminated, carefully remove clothing and launder. Wash your hands after handling contaminated clothing. Place all used disposable facemasks, gloves, and other waste in a lined container before disposing them with other household waste. Remove gloves and wash your hands immediately after handling these items.  Do not share dishes, glasses, or other household items with the patient Avoid  sharing household items. You should not share dishes, drinking glasses, cups, eating utensils, towels, bedding, or other items with a patient who is confirmed to have, or being evaluated for, COVID-19 infection. After the person uses these items, you should wash them thoroughly with soap and water.  Wash laundry thoroughly Immediately remove and wash clothes or bedding that have blood, body fluids, and/or secretions or excretions, such as sweat, saliva, sputum, nasal mucus, vomit, urine, or feces, on them. Wear gloves when handling laundry from the patient. Read and follow directions on labels of laundry or clothing items and detergent. In general, wash and dry with the warmest temperatures recommended on the label.  Clean all areas the individual has used often Clean all touchable surfaces, such as counters, tabletops, doorknobs, bathroom fixtures, toilets, phones, keyboards, tablets, and bedside tables, every day. Also, clean any surfaces that may have blood, body fluids, and/or secretions or excretions on them. Wear gloves when cleaning surfaces the patient has come in contact with. Use a diluted bleach solution (e.g., dilute bleach with 1 part bleach and 10 parts water) or a household disinfectant with a label that says EPA-registered for coronaviruses. To make a bleach solution at home, add 1 tablespoon of bleach to 1 quart (4 cups) of water. For a larger supply, add  cup of bleach to 1 gallon (16 cups) of water. Read labels of cleaning products and follow recommendations provided on product labels. Labels contain instructions for safe and effective use of the cleaning product including precautions you should take when applying the product, such as wearing gloves or eye protection and making sure you have good ventilation during use of the product. Remove gloves and wash hands immediately after cleaning.  Monitor yourself for signs and symptoms of illness Caregivers and household members are  considered close contacts, should monitor their health, and will be asked to limit movement outside of the home to the extent possible. Follow the monitoring steps for close contacts listed on the symptom monitoring form.  ? If you have additional questions, contact your local health department or call the epidemiologist on call at [phone] (available 24/7). ? This guidance is subject to change. For the most up-to-date guidance from [facility], please refer to their website: [email or web address]

## 2018-09-05 NOTE — ED Provider Notes (Signed)
[facility] EMERGENCY DEPARTMENT Provider Note   CSN: [id number] Arrival date & time: 09/05/18  1355    History   Chief Complaint Chief Complaint  Patient presents with  . Shortness of Breath    HPI Juan [name] is a 78 y.o. male with h/o esophageal cancer, anemia, DM, HTN presents to ED after positive COVID test today.  Reports for the last week he has had body aches, congestion, sneezing, rhinorrhea, dry cough, chest pain and shortness of breath with exertion, breathing, moving and generalized weakness, feeling like he is going to faint.  One episode of diarrhea, nonbloody non-melanotic today. He had a COVID test done at [facility] last Wednesday and he was notified that it came back positive today and he came to the ER for evaluation because he does not feel like there is anyone at home to help him.  Specifically, feels like he is too sick and weak to cook for himself.  He lives with his 74 year old grandson who is moving back to [place].  He works in a TE[employer] teacher [employer].  States that both of his coworkers there have tested positive for COVID, they both work within [employer] of each other.  No interventions for this so far.  No alleviating or aggravating factors.  No fever, headache, nausea, vomiting, abdominal pain.  No h/o DVT/PE, recent surgery or prolonged immobilization, calf pain or leg swelling, orthopnea, PND.    Juan [name] was evaluated in Emergency Department on 09/05/2018 for the symptoms described in the history of present illness. He was evaluated in the context of the global COVID-19 pandemic, which necessitated consideration that the patient might be at risk for infection with the SARS-CoV-2 virus that causes COVID-19. Institutional protocols and algorithms that pertain to the evaluation of patients at risk for COVID-19 are in a state of rapid change based on information released by regulatory bodies including the CDC and federal and state organizations.  These policies and algorithms were followed during the patient's care in the ED.    HPI  Past Medical History:  Diagnosis Date  . Aortic atherosclerosis ([place])   . Back pain   . Barrett esophagus   . Bleeding nose   . DDD (degenerative disc disease), thoracolumbar    Moderate to severe  . Diabetes mellitus without complication ([place])   . Diverticulosis   . Esophageal cancer ([place]) dx'd 2017  . GERD (gastroesophageal reflux disease)   . Headache   . Hearing loss    Right ear  . History of umbilical hernia repair   . Idiopathic peripheral neuropathy   . Iron deficiency anemia   . LBBB (left bundle branch block) 02/11/2016  . Left hip pain    Severe degenerative changes  . Lipoma of neck    Right  . Mixed hyperlipidemia   . PONV (postoperative nausea and vomiting)   . Reflux   . Right thyroid nodule     Patient Active Problem List   Diagnosis Date Noted  . Osteoarthritis of left hip 07/22/2017  . Iron deficiency anemia 05/17/2017  . LBBB (left bundle branch block) 02/11/2016  . Port catheter in place 12/25/2015  . 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 Esophageal cancer ([place]) 11/22/2015  . Noise effect on both inner ears 08/29/2015  . Right thyroid nodule 08/29/2015    Past Surgical History:  Procedure Laterality Date  . BALLOON DILATION N/A 05/06/2018   Procedure: BALLOON DILATION;  Surgeon:  Carol Ada, MD;  Location: [facility];  Service: Endoscopy;  Laterality: N/A;  . COLONOSCOPY    . ESOPHAGOGASTRODUODENOSCOPY (EGD) WITH PROPOFOL N/A 05/06/2018   Procedure: ESOPHAGOGASTRODUODENOSCOPY (EGD) WITH PROPOFOL;  Surgeon: Carol Ada, MD;  Location: [facility];  Service: Endoscopy;  Laterality: N/A;  . EUS N/A 12/05/2015   Procedure: UPPER ENDOSCOPIC ULTRASOUND (EUS) LINEAR;  Surgeon: Carol Ada, MD;  Location: [facility];  Service: Endoscopy;  Laterality: N/A;  . EUS N/A 03/10/2016   Procedure: UPPER ENDOSCOPIC  ULTRASOUND (EUS) LINEAR;  Surgeon: Carol Ada, MD;  Location: [facility];  Service: Endoscopy;  Laterality: N/A;  . EUS N/A 01/07/2017   Procedure: UPPER ENDOSCOPIC ULTRASOUND (EUS) LINEAR;  Surgeon: Carol Ada, MD;  Location: [place] [facility];  Service: Endoscopy;  Laterality: N/A;  . HERNIA REPAIR    . IR GENERIC HISTORICAL  12/24/2015   IR FLUORO GUIDE PORT INSERTION RIGHT 12/24/2015 Arne Cleveland, MD [facility]  . IR GENERIC HISTORICAL  12/24/2015   IR US GUIDE VASC ACCESS RIGHT 12/24/2015 Arne Cleveland, MD [facility]  . IR REMOVAL TUN ACCESS W/ PORT W/O FL MOD SED  02/07/2018  . PORTA CATH INSERTION    . SHOULDER ARTHROSCOPY W/ SUPERIOR LABRAL ANTERIOR POSTERIOR LESION REPAIR     times 2  . TOTAL HIP ARTHROPLASTY Left 07/22/2017   Procedure: LEFT TOTAL HIP ARTHROPLASTY ANTERIOR APPROACH;  Surgeon: Rod Can, MD;  Location: [facility];  Service: Orthopedics;  Laterality: Left;  . UPPER GI ENDOSCOPY  01/07/2017        Home Medications    Prior to Admission medications   Medication Sig Start Date End Date Taking? Authorizing Provider  amitriptyline (ELAVIL) 10 MG tablet Take 10 mg by mouth at bedtime. 03/11/18  Yes [provider]  aspirin EC 81 MG tablet Take 81 mg by mouth daily.   Yes [provider]  celecoxib (CELEBREX) 200 MG capsule Take 200 mg by mouth daily. 04/26/18  Yes [provider]  gabapentin (NEURONTIN) 300 MG capsule Take 300 mg by mouth 2 (two) times daily.   Yes [provider]  glimepiride (AMARYL) 1 MG tablet Take 1 mg by mouth daily after breakfast.  02/22/18  Yes [provider]  LIPITOR 20 MG tablet Take 20 mg by mouth every evening. 02/21/18  Yes [provider]  pantoprazole (PROTONIX) 40 MG tablet Take 40 mg by mouth daily. 02/21/18  Yes [provider]  PERCOCET 10-325 MG tablet Take 1 tablet by mouth 3 (three) times daily as needed for pain. 04/26/18  Yes [provider]    tiZANidine (ZANAFLEX) 2 MG tablet Take 2 mg by mouth 2 (two) times daily as needed for muscle spasms. 02/22/18  Yes [provider]  benzonatate (TESSALON) 100 MG capsule Take 1 capsule (100 mg total) by mouth every 8 (eight) hours. Patient not taking: Reported on 07/02/2018 06/03/18   Deno Etienne, DO  doxycycline (VIBRAMYCIN) 100 MG capsule Take 1 capsule (100 mg total) by mouth 2 (two) times daily. Patient not taking: Reported on 09/05/2018 07/02/18   [name], MD  furosemide (LASIX) 20 MG tablet Take 1 tablet (20 mg total) by mouth daily. Patient not taking: Reported on 09/05/2018 07/02/18   [name], MD    Family History Family History  Problem Relation Age of Onset  . Colon cancer Father 54  . Colon cancer Brother 53  . Heart attack Brother   . Heart disease Mother   . Heart attack Mother   .  Diabetes Sister   . Stroke Sister     Social History Social History   Tobacco Use  . Smoking status: Former Smoker    Packs/day: 2.00    Years: 40.00    Pack years: 80.00    Last attempt to quit: 03/31/1999    Years since quitting: 19.4  . Smokeless tobacco: Never Used  Substance Use Topics  . Alcohol use: Not Currently    Comment: weekend binge drinking for 30-40 years, quit in 2001  . Drug use: No     Allergies   Patient has no known allergies.   Review of Systems Review of Systems  HENT: Positive for congestion, postnasal drip, rhinorrhea and sneezing.   Respiratory: Positive for cough and shortness of breath.   Cardiovascular: Positive for chest pain.  Musculoskeletal: Positive for myalgias.  Neurological: Positive for weakness (generalized).  All other systems reviewed and are negative.    Physical Exam Updated Vital Signs BP (!) 150/82   Pulse 67   Temp 98.2 F (36.8 C) (Oral)   Resp (!) 21   SpO2 96%   Physical Exam Vitals signs and nursing note reviewed.  Constitutional:      Appearance: He is well-developed.     Comments: Non toxic.   HENT:     Head: Normocephalic and atraumatic.     Comments: Mild mucosal edema, erythema and clear rhinorrhea noted.    Nose: Nose normal.     Mouth/Throat:     Comments: Moist mucous membranes.  Mild erythema diffusely to oropharynx.  Tonsils normal.  No exudates.  Uvula is midline. Eyes:     Conjunctiva/sclera: Conjunctivae normal.  Neck:     Musculoskeletal: Normal range of motion.  Cardiovascular:     Rate and Rhythm: Normal rate and regular rhythm.     Heart sounds: Normal heart sounds.     Comments: 1+ radial and DP pulses bilaterally.  No lower extremity edema.  No calf tenderness. Pulmonary:     Effort: Pulmonary effort is normal.     Breath sounds: Normal breath sounds.     Comments: Normal work of breathing.  Speaking in full sentences. Abdominal:     General: Bowel sounds are normal.     Palpations: Abdomen is soft.     Tenderness: There is no abdominal tenderness.     Comments: No G/R/R. No suprapubic or CVA tenderness. Negative Murphy's and McBurney's  Musculoskeletal: Normal range of motion.  Skin:    General: Skin is warm and dry.     Capillary Refill: Capillary refill takes less than 2 seconds.  Neurological:     Mental Status: He is alert.  Psychiatric:        Behavior: Behavior normal.      ED Treatments / Results  Labs (all labs ordered are listed, but only abnormal results are displayed) Labs Reviewed  CBC WITH DIFFERENTIAL/PLATELET - Abnormal; Notable for the following components:      Result Value   WBC 3.6 (*)    RBC 5.93 (*)    MCV 74.4 (*)    MCH 22.9 (*)    Lymphs Abs 0.5 (*)    All other components within normal limits  BASIC METABOLIC PANEL  TROPONIN I  MAGNESIUM    EKG None  Radiology Dg Chest Portable 1 View  Result Date: 09/05/2018 CLINICAL DATA:  COVID-19 positive with shortness of breath and cough EXAM: PORTABLE CHEST 1 VIEW COMPARISON:  07/06/2018 FINDINGS: Cardiac shadow is within normal limits. Lungs  are well aerated  bilaterally. No focal infiltrate or sizable effusion is seen. Degenerative changes of the thoracic spine are noted. IMPRESSION: No acute abnormality noted. Electronically Signed   By: Inez Catalina M.D.   On: 09/05/2018 15:30    Procedures Procedures (including critical care time)  Medications Ordered in ED Medications - No data to display   Initial Impression / Assessment and Plan / ED Course  I have reviewed the triage vital signs and the nursing notes.  Pertinent labs & imaging results that were available during my care of the patient were reviewed by me and considered in my medical decision making (see chart for details).        78 y.o. -year-old male with presents with symptoms consistent with COVID. Test obtained last Wednesday, positive today. Known exposures to confirmed COVID-19 at work.  He is concerned he may not be able to care for himself at home.    Exam is very benign.  Normal WOB. No fever, tachypnea, tachycardia, hypoxemia. Lungs are CTAB. He ambulated in ER with Spo2 >92%, but no CP, light-headedness, syncope.  Given age, exertional symptoms, will obtain screening labs, CXR, EKG.   Final Clinical Impressions(s) / ED Diagnoses   5670: Work up remarkable as above WBC 3.6. Hgb normal. CXR w/o pulmonary edema, infiltrates, PTX. EKG w/o ischemia.  Pt will be handed off to oncoming EDP Dr Laverta Baltimore who will f/u on remaining labs. Anticipate discharge if benign.  His CP sounds atypical, worse with moving, coughing, breathing likely from cough. Given other infectious symptoms I have low suspicion for ACS, PE, dissection.  CP is not positional, no rubs noted on exam and pericarditis is less likely.   no h/o HF, no clinical signs of hypervolemia today. Discussed with Dr Regenia Skeeter.    Final diagnoses:  [id number] virus infection  Shortness of breath    ED Discharge Orders    None       Kinnie Feil, PA-C 09/05/18 1559    Sherwood Gambler, MD 09/08/18 [phone]

## 2018-09-05 NOTE — ED Notes (Signed)
ED Provider at bedside. 

## 2018-11-01 DIAGNOSIS — R7301 Impaired fasting glucose: Secondary | ICD-10-CM | POA: Diagnosis not present

## 2018-11-01 DIAGNOSIS — Z7189 Other specified counseling: Secondary | ICD-10-CM | POA: Diagnosis not present

## 2018-11-01 DIAGNOSIS — F1190 Opioid use, unspecified, uncomplicated: Secondary | ICD-10-CM | POA: Diagnosis not present

## 2018-11-01 DIAGNOSIS — E1165 Type 2 diabetes mellitus with hyperglycemia: Secondary | ICD-10-CM | POA: Diagnosis not present

## 2018-11-01 DIAGNOSIS — J439 Emphysema, unspecified: Secondary | ICD-10-CM | POA: Diagnosis not present

## 2018-11-01 DIAGNOSIS — E782 Mixed hyperlipidemia: Secondary | ICD-10-CM | POA: Diagnosis not present

## 2018-11-11 ENCOUNTER — Telehealth: Payer: Self-pay | Admitting: Hematology

## 2018-11-11 NOTE — Telephone Encounter (Signed)
I talk with patient regarding schedule  

## 2019-01-06 NOTE — Progress Notes (Signed)
[name]   Telephone:([phone]) [phone] [phone]   Clinic Follow up Note   Patient Care Team: Ensign, [name]), MD (Inactive) as PCP - General (Internal Medicine) [name], MD as Consulting Physician (Hematology) Kyung Rudd, MD as Consulting Physician (Radiation Oncology) Juanita Craver, MD as Consulting Physician (Gastroenterology)  Date of Service:  01/11/2019  CHIEF COMPLAINT: Follow up esophageal squamous cell carcinoma  SUMMARY OF ONCOLOGIC HISTORY: Oncology History Overview Note  Presented with dysphagia and odynophagia with 20-25 pounds of weight loss in about 3-4 months  Esophageal cancer ([place])   Staging form: Esophagus - Adenocarcinoma, AJCC 7th Edition   - Clinical stage from 11/13/2015: Stage IIIB (T3, N2, M0, G2) - Signed by [name], MD on 12/11/2015    Esophageal cancer ([place])  11/13/2015 Procedure   UPPER ENDOSCOPY per Dr. Collene Mares: Large fungating, friable bleeding mass in middle third of esophagus, 22cm from incisors and extended to 27cm. Non-obstructing.   11/13/2015 Pathology Results   Invasive squamous cell carcinoma; moderately differentiated   11/13/2015 Imaging   CT ABD/PELVIS: IMPRESSION:No evidence of metastatic disease in the abdomen or pelvis. 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 Aortoiliac atherosclerosis. Small bilateral inguinal hernias containing fat the   11/22/2015 Initial Diagnosis   Esophageal cancer ([place])   12/03/2015 Imaging   PET scan showed a long segment of hypermetabolic thickening in the mid esophagus consistent with esophageal carcinoma. No clear evidence of node metastasis or distant metastasis.   12/10/2015 - 01/14/2016 Radiation Therapy   Neoadjuvant radiation to his esophageal cancer   12/10/2015 - 01/15/2016 Chemotherapy   Neoadjuvant weekly carboplatin AUC 2, and Taxol 45 mg/m, with concurrent radiation. He developed steroid-induced psychosis, and Taxol was changed to Abraxane to avoid  premedication with steroids.   02/24/2016 Imaging   CT CAP with contrast showed interval improvement mid esophageal mass, no evidence for metastatic adenopathy or distant metastasis.    03/10/2016 Procedure   Repeat EGD by Dr. Benson Norway showed wall thickening in the thoracic esophagus with the tumor was. The thickness decreased from 12.8 mm to 6-7 mm. Not able to differentiate between actual tumor versus fibrosis from radiation. Some peritumoral shoddy lymph nodes.   04/07/2016 Surgery   Patient followed up with Dr. Servando Snare, patient is not a great candidate for surgery, pt also does not want surgery, mutually agreed to not pursue esophagectomy.    06/01/2016 Imaging   CT C/A/P IMPRESSION: Mild mid esophageal wall thickening without residual mass on CT. Radiation changes in the paramediastinal lung bilaterally. No evidence of recurrent or metastatic disease.   08/18/2016 -  Hospital Admission   Patient presented to the ER with SOB and complaints of chest pain   11/07/2016 Imaging   Nm Pet Image Restag IMPRESSION: Negative PET-CT. No findings for recurrent esophageal cancer or metastatic disease.   01/07/2017 Procedure   EUS by Dr. Benson Norway 01/07/17 IMPRESSION - Normal esophagus. - Normal stomach. - Normal examined duodenum. - Wall thickening was seen in the upper third of the esophagus and in the middle third of the esophagus. - One benign lymph node was visualized in the middle paraesophageal mediastinum (level 19M). - No specimens collected.   05/10/2017 Imaging   CT CAP W Contrast 05/10/17 IMPRESSION: 1. No new or progressive findings to suggest recurrent or metastatic disease in the chest or abdomen on today's study. 2.  Aortic Atherosclerois (ICD10-170.0)   01/14/2018 Imaging   01/14/2018 CT Neck IMPRESSION: 1. No evidence of cervical lymphadenopathy. 2. 5 cm  right neck lipoma, mildly enlarged from 2010.   01/14/2018 Imaging   01/14/2018 CT CAP IMPRESSION: 1. Interval  development of several tiny bilateral pulmonary nodules. While indeterminate and potentially related to infectious/inflammatory etiology, close attention recommended as early metastatic disease not excluded. 2. Otherwise stable exam. 3.  Emphysema. (ICD10-J43.9) 4.  Aortic Atherosclerois (ICD10-170.0)      CURRENT THERAPY:  Observation   INTERVAL HISTORY:  Juan [name] is here for a follow up of esophogeal cancer. She presents to the clinic alone. He notes he went to ED last night for having severe pain in his posterior legs, back and arms. He notes this was due to nerve pain. He was given steroid shots in ED. He notes he feels much better. He notes this pain started 2 weeks ago. He notes he has orthopedic surgeon.  He notes he tested positive for COVID in 09/2018. He feels he has recovered well. He is eating adequately. He needs information from his physicians who treated his cancer for his new insurance. He requested a letter to detail his diagnosis.  He notes his hearing is decreasing R>L and wants to get hearing aid soon.   REVIEW OF SYSTEMS:   Constitutional: Denies fevers, chills or abnormal weight loss Eyes: Denies blurriness of vision Ears, nose, mouth, throat, and face: Denies mucositis or sore throat (+) Hard of hearing  Respiratory: Denies cough, dyspnea or wheezes Cardiovascular: Denies palpitation, chest discomfort or lower extremity swelling Gastrointestinal:  Denies nausea, heartburn or change in bowel habits Skin: Denies abnormal skin rashes Lymphatics: Denies new lymphadenopathy or easy bruising Neurological:Denies numbness, tingling or new weaknesses (+) Whole body nerve pain Behavioral/Psych: Mood is stable, no new changes  All other systems were reviewed with the patient and are negative.  MEDICAL HISTORY:  Past Medical History:  Diagnosis Date  . Aortic atherosclerosis ([place])   . Back pain   . Barrett esophagus   . Bleeding nose   . DDD (degenerative disc  disease), thoracolumbar    Moderate to severe  . Diabetes mellitus without complication ([place])   . Diverticulosis   . Esophageal cancer ([place]) dx'd 2017  . GERD (gastroesophageal reflux disease)   . Headache   . Hearing loss    Right ear  . History of umbilical hernia repair   . Idiopathic peripheral neuropathy   . Iron deficiency anemia   . LBBB (left bundle branch block) 02/11/2016  . Left hip pain    Severe degenerative changes  . Lipoma of neck    Right  . Mixed hyperlipidemia   . PONV (postoperative nausea and vomiting)   . Reflux   . Right thyroid nodule     SURGICAL HISTORY: Past Surgical History:  Procedure Laterality Date  . BALLOON DILATION N/A 05/06/2018   Procedure: BALLOON DILATION;  Surgeon: Carol Ada, MD;  Location: [facility];  Service: Endoscopy;  Laterality: N/A;  . COLONOSCOPY    . ESOPHAGOGASTRODUODENOSCOPY (EGD) WITH PROPOFOL N/A 05/06/2018   Procedure: ESOPHAGOGASTRODUODENOSCOPY (EGD) WITH PROPOFOL;  Surgeon: Carol Ada, MD;  Location: [facility];  Service: Endoscopy;  Laterality: N/A;  . EUS N/A 12/05/2015   Procedure: UPPER ENDOSCOPIC ULTRASOUND (EUS) LINEAR;  Surgeon: Carol Ada, MD;  Location: [facility];  Service: Endoscopy;  Laterality: N/A;  . EUS N/A 03/10/2016   Procedure: UPPER ENDOSCOPIC ULTRASOUND (EUS) LINEAR;  Surgeon: Carol Ada, MD;  Location: [facility];  Service: Endoscopy;  Laterality: N/A;  . EUS N/A 01/07/2017   Procedure: UPPER ENDOSCOPIC ULTRASOUND (EUS) LINEAR;  Surgeon: Carol Ada, MD;  Location: [facility];  Service: Endoscopy;  Laterality: N/A;  . HERNIA REPAIR    . IR GENERIC HISTORICAL  12/24/2015   IR FLUORO GUIDE PORT INSERTION RIGHT 12/24/2015 Arne Cleveland, MD [facility]  . IR GENERIC HISTORICAL  12/24/2015   IR US GUIDE VASC ACCESS RIGHT 12/24/2015 Arne Cleveland, MD [facility]  . IR REMOVAL TUN ACCESS W/ PORT W/O FL MOD SED  02/07/2018  . PORTA CATH INSERTION    . SHOULDER ARTHROSCOPY W/ SUPERIOR  LABRAL ANTERIOR POSTERIOR LESION REPAIR     times 2  . TOTAL HIP ARTHROPLASTY Left 07/22/2017   Procedure: LEFT TOTAL HIP ARTHROPLASTY ANTERIOR APPROACH;  Surgeon: Rod Can, MD;  Location: [facility];  Service: Orthopedics;  Laterality: Left;  . UPPER GI ENDOSCOPY  01/07/2017    I have reviewed the social history and family history with the patient and they are unchanged from previous note.  ALLERGIES:  has No Known Allergies.  MEDICATIONS:  Current Outpatient Medications  Medication Sig Dispense Refill  . amitriptyline (ELAVIL) 10 MG tablet Take 10 mg by mouth at bedtime.    Marland Kitchen aspirin EC 81 MG tablet Take 81 mg by mouth daily.    . furosemide (LASIX) 20 MG tablet Take 1 tablet (20 mg total) by mouth daily. 5 tablet 0  . gabapentin (NEURONTIN) 300 MG capsule Take 300 mg by mouth 2 (two) times daily.    Marland Kitchen glimepiride (AMARYL) 1 MG tablet Take 1 mg by mouth daily after breakfast.     . LIPITOR 20 MG tablet Take 20 mg by mouth every evening.    Marland Kitchen oxyCODONE-acetaminophen (PERCOCET) 10-325 MG tablet     . pantoprazole (PROTONIX) 40 MG tablet Take 40 mg by mouth daily.    Marland Kitchen tiZANidine (ZANAFLEX) 2 MG tablet Take 2 mg by mouth 2 (two) times daily as needed for muscle spasms.     No current facility-administered medications for this visit.     PHYSICAL EXAMINATION: ECOG PERFORMANCE STATUS: 2 - Symptomatic, <50% confined to bed  Vitals:   01/11/19 1355  BP: (!) 141/85  Pulse: 67  Resp: 20  Temp: 98.3 F (36.8 C)  SpO2: 98%   Filed Weights   01/11/19 1355  Weight: 200 lb 11.2 oz (91 kg)    GENERAL:alert, no distress and comfortable SKIN: skin color, texture, turgor are normal, no rashes or significant lesions EYES: normal, Conjunctiva are pink and non-injected, sclera clear  NECK: supple, thyroid normal size, non-tender, without nodularity LYMPH:  no palpable lymphadenopathy in the cervical, axillary  LUNGS: clear to auscultation and percussion with normal breathing  effort HEART: regular rate & rhythm and no murmurs and no lower extremity edema ABDOMEN:abdomen soft, non-tender and normal bowel sounds Musculoskeletal:no cyanosis of digits and no clubbing  NEURO: alert & oriented x 3 with fluent speech, no focal motor/sensory deficits  LABORATORY DATA:  I have reviewed the data as listed CBC Latest Ref Rng & Units 01/11/2019 09/05/2018 07/02/2018  WBC 4.0 - 10.5 K/uL 4.8 3.6(L) 3.5(L)  Hemoglobin 13.0 - 17.0 g/dL 12.8(L) 13.6 12.7(L)  Hematocrit 39.0 - 52.0 % 41.5 44.1 44.3  Platelets 150 - 400 K/uL 283 273 307     CMP Latest Ref Rng & Units 01/11/2019 09/05/2018 07/02/2018  Glucose 70 - 99 mg/dL 138(H) 105(H) 104(H)  BUN 8 - 23 mg/dL 13 10 6(L)  Creatinine 0.61 - 1.24 mg/dL 1.07 0.99 1.01  Sodium 135 - 145 mmol/L 140 141 139  Potassium 3.5 - 5.1 mmol/L 4.4 3.7 4.5  Chloride 98 - 111 mmol/L 106 106 105  CO2 22 - 32 mmol/L 26 22 25   Calcium 8.9 - 10.3 mg/dL 9.1 8.7(L) 8.7(L)  Total Protein 6.5 - 8.1 g/dL 7.4 - 7.1  Total Bilirubin 0.3 - 1.2 mg/dL 0.5 - 0.9  Alkaline Phos 38 - 126 U/L 88 - 96  AST 15 - 41 U/L 25 - 25  ALT 0 - 44 U/L 18 - 17      RADIOGRAPHIC STUDIES: I have personally reviewed the radiological images as listed and agreed with the findings in the report. Dg Lumbar Spine Complete  Result Date: 01/11/2019 CLINICAL DATA:  Back pain EXAM: LUMBAR SPINE - COMPLETE 4+ VIEW COMPARISON:  CT 01/14/2018 FINDINGS: Left hip replacement. Suspected transitional anatomy with 4 non rib-bearing lumbar type vertebra. These will be labeled L1 through L4. Trace retrolisthesis L2 on L3. Moderate diffuse degenerative changes of the lumbar spine. Posterior facet degenerative changes. Aortic atherosclerosis. No acute osseous abnormality IMPRESSION: Moderate diffuse degenerative changes without acute osseous abnormality Electronically Signed   By: Donavan Foil M.D.   On: 01/11/2019 04:25     ASSESSMENT & PLAN:  Juan [name] is a 78 y.o. male with   1.  Esophageal cancer, squamous cell carcinoma, cT3N1-2M0, stage IIIB, s/p chemoRT only  -He was diagnosed in 10/2015. EUS revealed a T3 lesion, N1 vs N2, locally advanced disease. -He was treated with concurrent chemoRT with weekly carboplatin and Taxol. Taxol was changed to Abraxane due to steroids induced psychosis -Patient and Dr. Servando Snare has mutually agreed not to pursue esophagectomy due to his advanced age and general health condition -We previously discussed that his esophageal cancer is unlikely cured by chemoradiation alone without surgery. I reviewed his repeated EUS after his chemotherapy and irradiation, which showed good response to chemoradiation, but possible residual tumor. -Following scans have been NED.  --From a cancer standpoint he is clinically doing well, no dysphasia or other concerning symptoms. Labs reviewed, CBC and CMP WNL except Hg 12.8. Physical exam unremarkable. There is no clinical concern for recurrence.  -Last CT chest was in April 2020, last CT of abdomen was in 2019, which showed no evidence of recurrence. -He is 3 years since his cancer diagnosis. I discussed his risk of recurrence has decreased significantly. Will do last routine CT scan in 2-3 weeks.  -F/u in 6 months  -He has had flu shot this year at [facility]  2. History of steroids induced psychosis -Resolved now. We'll avoid steroids in the future.  3. Leukopenia and anemia  -Secondary to chemotherapy radiation -We'll continue monitoring, no need for blood transfusion -Hg 12.8 today, platelets normal (01/11/19)  4. Chronic back pain, left hip pain and leg pain  -I previously encouraged the patient to follow with primary care physician for back pain -I will not refill his narcotics. -He has developed left hip pain since June 2018, which he contributes to arthritis. He follow up with his PCP. Managed with Gabapentin and oxycodone.   -PET scan in August 2018 was negative for metastasis -He underwent left  total hip arthroplasty on 07/22/17 -He notes having full body nerve pain for 2 weeks before worsened and he went to ED last night. He was given 2 steroid shots and his pain much better.  -I strongly encouraged him to f/u with PCP and orthopedic surgeon.   5. COVID-19  -Tested positive in 09/2018. Has recovered well. No symptoms remaining.   PLAN:  -  He is clinically doing well  -CT AP W Contrast in 2-3 weeks. I will call him with results.  -Lab and f/u in 6 months  -Per pt request I provided letter detailing his diagnosis and treatment.    No problem-specific Assessment & Plan notes found for this encounter.   Orders Placed This Encounter  Procedures  . CT Abdomen Pelvis W Contrast    Standing Status:   Future    Standing Expiration Date:   01/11/2020    Order Specific Question:   If indicated for the ordered procedure, I authorize the administration of contrast media per Radiology protocol    Answer:   Yes    Order Specific Question:   Preferred imaging location?    Answer:   [facility]    Order Specific Question:   Is Oral Contrast requested for this exam?    Answer:   Yes, Per Radiology protocol    Order Specific Question:   Radiology Contrast Protocol - do NOT remove file path    Answer:   [email or web address]   All questions were answered. The patient knows to call the clinic with any problems, questions or concerns. No barriers to learning was detected. I spent 20 minutes counseling the patient face to face. The total time spent in the appointment was 25 minutes and more than 50% was on counseling and review of test results     [name], MD 01/11/2019   I, Joslyn Devon, am acting as scribe for [name], MD.   I have reviewed the above documentation for accuracy and completeness, and I agree with the above.

## 2019-01-10 ENCOUNTER — Other Ambulatory Visit: Payer: Self-pay

## 2019-01-10 DIAGNOSIS — M5430 Sciatica, unspecified side: Secondary | ICD-10-CM | POA: Insufficient documentation

## 2019-01-10 DIAGNOSIS — E119 Type 2 diabetes mellitus without complications: Secondary | ICD-10-CM | POA: Insufficient documentation

## 2019-01-10 DIAGNOSIS — Z96642 Presence of left artificial hip joint: Secondary | ICD-10-CM | POA: Diagnosis not present

## 2019-01-10 DIAGNOSIS — Z87891 Personal history of nicotine dependence: Secondary | ICD-10-CM | POA: Insufficient documentation

## 2019-01-10 DIAGNOSIS — Z8501 Personal history of malignant neoplasm of esophagus: Secondary | ICD-10-CM | POA: Diagnosis not present

## 2019-01-10 DIAGNOSIS — Z79899 Other long term (current) drug therapy: Secondary | ICD-10-CM | POA: Diagnosis not present

## 2019-01-10 DIAGNOSIS — M545 Low back pain: Secondary | ICD-10-CM | POA: Diagnosis not present

## 2019-01-10 DIAGNOSIS — Z7982 Long term (current) use of aspirin: Secondary | ICD-10-CM | POA: Insufficient documentation

## 2019-01-11 ENCOUNTER — Encounter: Payer: Self-pay | Admitting: Hematology

## 2019-01-11 ENCOUNTER — Emergency Department (HOSPITAL_COMMUNITY): Payer: 59

## 2019-01-11 ENCOUNTER — Emergency Department (HOSPITAL_COMMUNITY)
Admission: EM | Admit: 2019-01-11 | Discharge: 2019-01-11 | Disposition: A | Discharge status: Home / Self Care | Payer: 59 | Attending: Emergency Medicine | Admitting: Emergency Medicine

## 2019-01-11 ENCOUNTER — Encounter (HOSPITAL_COMMUNITY): Payer: Self-pay | Admitting: Family Medicine

## 2019-01-11 ENCOUNTER — Inpatient Hospital Stay (HOSPITAL_BASED_OUTPATIENT_CLINIC_OR_DEPARTMENT_OTHER): Payer: 59 | Admitting: Hematology

## 2019-01-11 ENCOUNTER — Inpatient Hospital Stay: Payer: 59 | Attending: Hematology

## 2019-01-11 ENCOUNTER — Other Ambulatory Visit: Payer: Self-pay

## 2019-01-11 VITALS — BP 141/85 | HR 67 | Temp 98.3°F | Resp 20 | Ht 69.0 in | Wt 200.7 lb

## 2019-01-11 DIAGNOSIS — M5430 Sciatica, unspecified side: Secondary | ICD-10-CM

## 2019-01-11 DIAGNOSIS — D6481 Anemia due to antineoplastic chemotherapy: Secondary | ICD-10-CM | POA: Diagnosis not present

## 2019-01-11 DIAGNOSIS — C154 Malignant neoplasm of middle third of esophagus: Secondary | ICD-10-CM | POA: Diagnosis not present

## 2019-01-11 DIAGNOSIS — G8929 Other chronic pain: Secondary | ICD-10-CM | POA: Diagnosis not present

## 2019-01-11 DIAGNOSIS — M549 Dorsalgia, unspecified: Secondary | ICD-10-CM | POA: Insufficient documentation

## 2019-01-11 DIAGNOSIS — Z8501 Personal history of malignant neoplasm of esophagus: Secondary | ICD-10-CM | POA: Diagnosis not present

## 2019-01-11 DIAGNOSIS — D72818 Other decreased white blood cell count: Secondary | ICD-10-CM | POA: Diagnosis not present

## 2019-01-11 DIAGNOSIS — M25552 Pain in left hip: Secondary | ICD-10-CM | POA: Insufficient documentation

## 2019-01-11 DIAGNOSIS — M545 Low back pain: Secondary | ICD-10-CM | POA: Diagnosis not present

## 2019-01-11 LAB — CBC WITH DIFFERENTIAL/PLATELET
Abs Immature Granulocytes: 0 10*3/uL (ref 0.00–0.07)
Basophils Absolute: 0 10*3/uL (ref 0.0–0.1)
Basophils Relative: 0 %
Eosinophils Absolute: 0 10*3/uL (ref 0.0–0.5)
Eosinophils Relative: 0 %
HCT: 41.5 % (ref 39.0–52.0)
Hemoglobin: 12.8 g/dL — ABNORMAL LOW (ref 13.0–17.0)
Immature Granulocytes: 0 %
Lymphocytes Relative: 13 %
Lymphs Abs: 0.6 10*3/uL — ABNORMAL LOW (ref 0.7–4.0)
MCH: 23.7 pg — ABNORMAL LOW (ref 26.0–34.0)
MCHC: 30.8 g/dL (ref 30.0–36.0)
MCV: 76.9 fL — ABNORMAL LOW (ref 80.0–100.0)
Monocytes Absolute: 0.1 10*3/uL (ref 0.1–1.0)
Monocytes Relative: 2 %
Neutro Abs: 4.1 10*3/uL (ref 1.7–7.7)
Neutrophils Relative %: 85 %
Platelets: 283 10*3/uL (ref 150–400)
RBC: 5.4 MIL/uL (ref 4.22–5.81)
RDW: 14.7 % (ref 11.5–15.5)
WBC: 4.8 10*3/uL (ref 4.0–10.5)
nRBC: 0 % (ref 0.0–0.2)

## 2019-01-11 LAB — COMPREHENSIVE METABOLIC PANEL
ALT: 18 U/L (ref 0–44)
AST: 25 U/L (ref 15–41)
Albumin: 4.2 g/dL (ref 3.5–5.0)
Alkaline Phosphatase: 88 U/L (ref 38–126)
Anion gap: 8 (ref 5–15)
BUN: 13 mg/dL (ref 8–23)
CO2: 26 mmol/L (ref 22–32)
Calcium: 9.1 mg/dL (ref 8.9–10.3)
Chloride: 106 mmol/L (ref 98–111)
Creatinine, Ser: 1.07 mg/dL (ref 0.61–1.24)
GFR calc Af Amer: >60 mL/min (ref >60)
GFR calc non Af Amer: >60 mL/min (ref >60)
Glucose, Bld: 138 mg/dL — ABNORMAL HIGH (ref 70–99)
Potassium: 4.4 mmol/L (ref 3.5–5.1)
Sodium: 140 mmol/L (ref 135–145)
Total Bilirubin: 0.5 mg/dL (ref 0.3–1.2)
Total Protein: 7.4 g/dL (ref 6.5–8.1)

## 2019-01-11 MED ORDER — LIDOCAINE 5 % EX PTCH: 2.0000 | MEDICATED_PATCH | CUTANEOUS | 0 refills | Status: DC

## 2019-01-11 MED ORDER — DICLOFENAC SODIUM ER 100 MG PO TB24: 100.0000 mg | ORAL_TABLET | Freq: Every day | ORAL | 0 refills | Status: DC

## 2019-01-11 MED ORDER — LIDOCAINE 5 % EX PTCH
2.0000 | MEDICATED_PATCH | CUTANEOUS | Status: DC
  Administered 2019-01-11: 2 via TRANSDERMAL
  Filled 2019-01-11: qty 2

## 2019-01-11 MED ORDER — DEXAMETHASONE SODIUM PHOSPHATE 4 MG/ML IJ SOLN
4.0000 mg | Freq: Once | INTRAMUSCULAR | Status: AC
  Administered 2019-01-11: 4 mg via INTRAMUSCULAR
  Filled 2019-01-11: qty 1

## 2019-01-11 MED ORDER — KETOROLAC TROMETHAMINE 60 MG/2ML IM SOLN
60.0000 mg | Freq: Once | INTRAMUSCULAR | Status: AC
  Administered 2019-01-11: 60 mg via INTRAMUSCULAR
  Filled 2019-01-11: qty 2

## 2019-01-11 NOTE — ED Provider Notes (Signed)
[place] DEPT Provider Note   CSN: [id number] Arrival date & time: 01/10/19  2351     History   Chief Complaint Chief Complaint  Patient presents with  . Back Pain    HPI Juan [name] is a 78 y.o. male.     The history is provided by the patient.  Back Pain Location:  Sacro-iliac joint Quality:  Cramping and shooting Radiates to:  L posterior upper leg Pain severity:  Severe Pain is:  Same all the time Onset quality:  Sudden Duration:  3 weeks Timing:  Constant Progression:  Unchanged Chronicity:  Recurrent Context: lifting heavy objects   Context: not emotional stress, not falling and not jumping from heights   Relieved by:  Nothing Worsened by:  Nothing Ineffective treatments:  None tried   Past Medical History:  Diagnosis Date  . Aortic atherosclerosis ([place])   . Back pain   . Barrett esophagus   . Bleeding nose   . DDD (degenerative disc disease), thoracolumbar    Moderate to severe  . Diabetes mellitus without complication ([place])   . Diverticulosis   . Esophageal cancer ([place]) dx'd 2017  . GERD (gastroesophageal reflux disease)   . Headache   . Hearing loss    Right ear  . History of umbilical hernia repair   . Idiopathic peripheral neuropathy   . Iron deficiency anemia   . LBBB (left bundle branch block) 02/11/2016  . Left hip pain    Severe degenerative changes  . Lipoma of neck    Right  . Mixed hyperlipidemia   . PONV (postoperative nausea and vomiting)   . Reflux   . Right thyroid nodule     Patient Active Problem List   Diagnosis Date Noted  . Osteoarthritis of left hip 07/22/2017  . Iron deficiency anemia 05/17/2017  . LBBB (left bundle branch block) 02/11/2016  . Port catheter in place 12/25/2015  . 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 Esophageal cancer ([place]) 11/22/2015  . Noise effect on both inner ears 08/29/2015  . Right thyroid nodule 08/29/2015     Past Surgical History:  Procedure Laterality Date  . BALLOON DILATION N/A 05/06/2018   Procedure: BALLOON DILATION;  Surgeon: Carol Ada, MD;  Location: [facility];  Service: Endoscopy;  Laterality: N/A;  . COLONOSCOPY    . ESOPHAGOGASTRODUODENOSCOPY (EGD) WITH PROPOFOL N/A 05/06/2018   Procedure: ESOPHAGOGASTRODUODENOSCOPY (EGD) WITH PROPOFOL;  Surgeon: Carol Ada, MD;  Location: [facility];  Service: Endoscopy;  Laterality: N/A;  . EUS N/A 12/05/2015   Procedure: UPPER ENDOSCOPIC ULTRASOUND (EUS) LINEAR;  Surgeon: Carol Ada, MD;  Location: [facility];  Service: Endoscopy;  Laterality: N/A;  . EUS N/A 03/10/2016   Procedure: UPPER ENDOSCOPIC ULTRASOUND (EUS) LINEAR;  Surgeon: Carol Ada, MD;  Location: [facility];  Service: Endoscopy;  Laterality: N/A;  . EUS N/A 01/07/2017   Procedure: UPPER ENDOSCOPIC ULTRASOUND (EUS) LINEAR;  Surgeon: Carol Ada, MD;  Location: [facility];  Service: Endoscopy;  Laterality: N/A;  . HERNIA REPAIR    . IR GENERIC HISTORICAL  12/24/2015   IR FLUORO GUIDE PORT INSERTION RIGHT 12/24/2015 Arne Cleveland, MD [facility]  . IR GENERIC HISTORICAL  12/24/2015   IR US GUIDE VASC ACCESS RIGHT 12/24/2015 Arne Cleveland, MD [facility]  . IR REMOVAL TUN ACCESS W/ PORT W/O FL MOD SED  02/07/2018  . PORTA CATH INSERTION    . SHOULDER ARTHROSCOPY W/ SUPERIOR LABRAL ANTERIOR POSTERIOR LESION REPAIR  times 2  . TOTAL HIP ARTHROPLASTY Left 07/22/2017   Procedure: LEFT TOTAL HIP ARTHROPLASTY ANTERIOR APPROACH;  Surgeon: Rod Can, MD;  Location: [facility];  Service: Orthopedics;  Laterality: Left;  . UPPER GI ENDOSCOPY  01/07/2017        Home Medications    Prior to Admission medications   Medication Sig Start Date End Date Taking? Authorizing Provider  amitriptyline (ELAVIL) 10 MG tablet Take 10 mg by mouth at bedtime. 03/11/18   [provider]  aspirin EC 81 MG tablet Take 81 mg by mouth daily.    [provider]   benzonatate (TESSALON) 100 MG capsule Take 1 capsule (100 mg total) by mouth every 8 (eight) hours. Patient not taking: Reported on 07/02/2018 06/03/18   Deno Etienne, DO  celecoxib (CELEBREX) 200 MG capsule Take 200 mg by mouth daily. 04/26/18   [provider]  Diclofenac Sodium CR 100 MG 24 hr tablet Take 1 tablet (100 mg total) by mouth daily. 01/11/19   Chandani Rogowski, MD  doxycycline (VIBRAMYCIN) 100 MG capsule Take 1 capsule (100 mg total) by mouth 2 (two) times daily. Patient not taking: Reported on 09/05/2018 07/02/18   [name], MD  furosemide (LASIX) 20 MG tablet Take 1 tablet (20 mg total) by mouth daily. Patient not taking: Reported on 09/05/2018 07/02/18   [name], MD  gabapentin (NEURONTIN) 300 MG capsule Take 300 mg by mouth 2 (two) times daily.    [provider]  glimepiride (AMARYL) 1 MG tablet Take 1 mg by mouth daily after breakfast.  02/22/18   [provider]  lidocaine (LIDODERM) 5 % Place 2 patches onto the skin daily. Remove & Discard patch within 12 hours or as directed by MD 01/11/19   Blaike Newburn, MD  LIPITOR 20 MG tablet Take 20 mg by mouth every evening. 02/21/18   [provider]  pantoprazole (PROTONIX) 40 MG tablet Take 40 mg by mouth daily. 02/21/18   [provider]  PERCOCET 10-325 MG tablet Take 1 tablet by mouth 3 (three) times daily as needed for pain. 04/26/18   [provider]  tiZANidine (ZANAFLEX) 2 MG tablet Take 2 mg by mouth 2 (two) times daily as needed for muscle spasms. 02/22/18   [provider]    Family History Family History  Problem Relation Age of Onset  . Colon cancer Father 11  . Colon cancer Brother 17  . Heart attack Brother   . Heart disease Mother   . Heart attack Mother   . Diabetes Sister   . Stroke Sister     Social History Social History   Tobacco Use  . Smoking status: Former Smoker    Packs/day: 2.00    Years: 40.00    Pack years: 80.00    Quit  date: 03/31/1999    Years since quitting: 19.7  . Smokeless tobacco: Never Used  Substance Use Topics  . Alcohol use: Not Currently  . Drug use: No     Allergies   Patient has no known allergies.   Review of Systems Review of Systems  Musculoskeletal: Positive for back pain.     Physical Exam Updated Vital Signs BP (!) 143/88 (BP Location: Left Arm)   Pulse 62   Temp 97.7 F (36.5 C) (Oral)   Resp 16   Ht 5\' 9"  (1.753 m)   Wt 93 kg   SpO2 99%   BMI 30.27 kg/m   Physical Exam Vitals signs  and nursing note reviewed.  Constitutional:      General: He is not in acute distress.    Appearance: Normal appearance. He is not ill-appearing.  HENT:     Head: Normocephalic and atraumatic.     Nose: Nose normal.  Eyes:     Conjunctiva/sclera: Conjunctivae normal.     Pupils: Pupils are equal, round, and reactive to light.  Neck:     Musculoskeletal: Normal range of motion and neck supple.  Cardiovascular:     Rate and Rhythm: Normal rate.     Pulses: Normal pulses.     Heart sounds: Normal heart sounds.  Pulmonary:     Effort: Pulmonary effort is normal. No respiratory distress.     Breath sounds: No stridor. No wheezing, rhonchi or rales.  Chest:     Chest wall: No tenderness.  Abdominal:     General: Abdomen is flat. Bowel sounds are normal.     Tenderness: There is no abdominal tenderness. There is no guarding or rebound.  Musculoskeletal: Normal range of motion.        General: No tenderness or deformity.     Right hip: Normal.     Left hip: Normal.     Right knee: Normal.     Left knee: Normal.     Cervical back: Normal.     Thoracic back: Normal.     Lumbar back: Normal.  Skin:    General: Skin is warm and dry.     Capillary Refill: Capillary refill takes less than 2 seconds.  Neurological:     General: No focal deficit present.     Mental Status: He is alert and oriented to person, place, and time.     Deep Tendon Reflexes: Reflexes normal.   Psychiatric:        Mood and Affect: Mood normal.        Behavior: Behavior normal.      ED Treatments / Results  Labs (all labs ordered are listed, but only abnormal results are displayed) Labs Reviewed - No data to display  EKG None  Radiology Dg Lumbar Spine Complete  Result Date: 01/11/2019 CLINICAL DATA:  Back pain EXAM: LUMBAR SPINE - COMPLETE 4+ VIEW COMPARISON:  CT 01/14/2018 FINDINGS: Left hip replacement. Suspected transitional anatomy with 4 non rib-bearing lumbar type vertebra. These will be labeled L1 through L4. Trace retrolisthesis L2 on L3. Moderate diffuse degenerative changes of the lumbar spine. Posterior facet degenerative changes. Aortic atherosclerosis. No acute osseous abnormality IMPRESSION: Moderate diffuse degenerative changes without acute osseous abnormality Electronically Signed   By: Donavan Foil M.D.   On: 01/11/2019 04:25    Procedures Procedures (including critical care time)  Medications Ordered in ED Medications  lidocaine (LIDODERM) 5 % 2 patch (2 patches Transdermal Patch Applied 01/11/19 0523)  ketorolac (TORADOL) injection 60 mg (60 mg Intramuscular Given 01/11/19 0523)  dexamethasone (DECADRON) injection 4 mg (4 mg Intramuscular Given 01/11/19 0523)      Xrays are negative.  Patient is known to have back pain.  No bowel or bladder incontinence. Denies urinary symptoms.  No f/c/r.  No point tenderness.  Xrays without acute injury.  Ambulatory without weakness.  No concern for infection nor cauda equina.  Will treat symptomatically.   Kevian Burrola was evaluated in Emergency Department on 01/11/2019 for the symptoms described in the history of present illness. He was evaluated in the context of the global COVID-19 pandemic, which necessitated consideration that the patient might be at risk  for infection with the SARS-CoV-2 virus that causes COVID-19. Institutional protocols and algorithms that pertain to the evaluation of patients at risk for  COVID-19 are in a state of rapid change based on information released by regulatory bodies including the CDC and federal and state organizations. These policies and algorithms were followed during the patient's care in the ED.   Final Clinical Impressions(s) / ED Diagnoses   Final diagnoses:  Sciatica, unspecified laterality   Return for weakness, numbness, changes in vision or speech, fevers >100.4 unrelieved by medication, shortness of breath, intractable vomiting, or diarrhea, abdominal pain, Inability to tolerate liquids or food, cough, altered mental status or any concerns. No signs of systemic illness or infection. The patient is nontoxic-appearing on exam and vital signs are within normal limits.   I have reviewed the triage vital signs and the nursing notes. Pertinent labs &imaging results that were available during my care of the patient were reviewed by me and considered in my medical decision making (see chart for details).  After history, exam, and medical workup I feel the patient has been appropriately medically screened and is safe for discharge home. Pertinent diagnoses were discussed with the patient. Patient was given return precautions. ED Discharge Orders         Ordered    Diclofenac Sodium CR 100 MG 24 hr tablet  Daily     01/11/19 0539    lidocaine (LIDODERM) 5 %  Every 24 hours     01/11/19 0539           Bladen Umar, MD 01/11/19 [id number]

## 2019-01-11 NOTE — ED Triage Notes (Signed)
Patient is complaining of left lower back pain that radiates down his left leg that started yesterday. Denies injury. Reports left leg numbness and tingling. Denies loss of bowel or bladder. Patient is ambulatory to triage.

## 2019-01-12 ENCOUNTER — Telehealth: Payer: Self-pay | Admitting: Hematology

## 2019-01-12 NOTE — Telephone Encounter (Signed)
Scheduled appt per 10/14 los.  Left a vm of the appt date and time.

## 2019-01-25 ENCOUNTER — Ambulatory Visit (HOSPITAL_COMMUNITY)
Admission: RE | Admit: 2019-01-25 | Discharge: 2019-01-25 | Disposition: A | Discharge status: Home / Self Care | Payer: 59 | Source: Ambulatory Visit | Attending: Hematology | Admitting: Hematology

## 2019-01-25 ENCOUNTER — Other Ambulatory Visit: Payer: Self-pay

## 2019-01-25 ENCOUNTER — Encounter (HOSPITAL_COMMUNITY): Payer: Self-pay

## 2019-01-25 DIAGNOSIS — C154 Malignant neoplasm of middle third of esophagus: Secondary | ICD-10-CM

## 2019-01-25 DIAGNOSIS — C159 Malignant neoplasm of esophagus, unspecified: Secondary | ICD-10-CM | POA: Diagnosis not present

## 2019-01-25 MED ORDER — SODIUM CHLORIDE (PF) 0.9 % IJ SOLN
INTRAMUSCULAR | Status: AC
  Filled 2019-01-25: qty 50

## 2019-01-25 MED ORDER — IOHEXOL 300 MG/ML  SOLN
100.0000 mL | Freq: Once | INTRAMUSCULAR | Status: AC | PRN
  Administered 2019-01-25: 100 mL via INTRAVENOUS

## 2019-02-01 ENCOUNTER — Telehealth: Payer: Self-pay

## 2019-02-01 NOTE — Telephone Encounter (Signed)
Left voice message for patient on patient identified voice mail box with CT results.  Per Dr. Burr Medico CT was negative, no concerns, great news.  Encouraged him to call back if he has any questions.

## 2019-02-01 NOTE — Telephone Encounter (Signed)
-----   Message from [name], MD sent at 02/01/2019 10:06 AM EST ----- Please let pt know his CT scan result, which is negative, good news!  [name]  02/01/2019

## 2019-02-10 ENCOUNTER — Other Ambulatory Visit: Payer: Self-pay

## 2019-02-10 DIAGNOSIS — Z20822 Contact with and (suspected) exposure to covid-19: Secondary | ICD-10-CM

## 2019-02-14 LAB — NOVEL CORONAVIRUS, NAA: SARS-CoV-2, NAA: NOT DETECTED

## 2019-02-15 ENCOUNTER — Telehealth: Payer: Self-pay

## 2019-02-15 NOTE — Telephone Encounter (Signed)
Per pt's. Request, faxed COVID 19 test results to [facility]; Attention: Elenore Rota @ [phone].

## 2019-02-22 DIAGNOSIS — E1165 Type 2 diabetes mellitus with hyperglycemia: Secondary | ICD-10-CM | POA: Diagnosis not present

## 2019-02-22 DIAGNOSIS — E782 Mixed hyperlipidemia: Secondary | ICD-10-CM | POA: Diagnosis not present

## 2019-02-22 DIAGNOSIS — R7301 Impaired fasting glucose: Secondary | ICD-10-CM | POA: Diagnosis not present

## 2019-03-08 DIAGNOSIS — G609 Hereditary and idiopathic neuropathy, unspecified: Secondary | ICD-10-CM | POA: Diagnosis not present

## 2019-03-08 DIAGNOSIS — F1190 Opioid use, unspecified, uncomplicated: Secondary | ICD-10-CM | POA: Diagnosis not present

## 2019-03-08 DIAGNOSIS — E782 Mixed hyperlipidemia: Secondary | ICD-10-CM | POA: Diagnosis not present

## 2019-03-08 DIAGNOSIS — E1165 Type 2 diabetes mellitus with hyperglycemia: Secondary | ICD-10-CM | POA: Diagnosis not present

## 2019-03-08 DIAGNOSIS — J439 Emphysema, unspecified: Secondary | ICD-10-CM | POA: Diagnosis not present

## 2019-03-08 DIAGNOSIS — R0602 Shortness of breath: Secondary | ICD-10-CM | POA: Diagnosis not present

## 2019-05-04 DIAGNOSIS — H903 Sensorineural hearing loss, bilateral: Secondary | ICD-10-CM | POA: Diagnosis not present

## 2019-06-21 DIAGNOSIS — J439 Emphysema, unspecified: Secondary | ICD-10-CM | POA: Diagnosis not present

## 2019-06-21 DIAGNOSIS — E782 Mixed hyperlipidemia: Secondary | ICD-10-CM | POA: Diagnosis not present

## 2019-06-21 DIAGNOSIS — F1190 Opioid use, unspecified, uncomplicated: Secondary | ICD-10-CM | POA: Diagnosis not present

## 2019-06-21 DIAGNOSIS — E1165 Type 2 diabetes mellitus with hyperglycemia: Secondary | ICD-10-CM | POA: Diagnosis not present

## 2019-06-21 DIAGNOSIS — G609 Hereditary and idiopathic neuropathy, unspecified: Secondary | ICD-10-CM | POA: Diagnosis not present

## 2019-06-24 ENCOUNTER — Other Ambulatory Visit: Payer: Self-pay

## 2019-06-24 ENCOUNTER — Emergency Department (HOSPITAL_COMMUNITY): Payer: 59

## 2019-06-24 ENCOUNTER — Emergency Department (HOSPITAL_COMMUNITY)
Admission: EM | Admit: 2019-06-24 | Discharge: 2019-06-24 | Disposition: A | Discharge status: Home / Self Care | Payer: 59 | Attending: Emergency Medicine | Admitting: Emergency Medicine

## 2019-06-24 DIAGNOSIS — Z20822 Contact with and (suspected) exposure to covid-19: Secondary | ICD-10-CM | POA: Insufficient documentation

## 2019-06-24 DIAGNOSIS — Z7984 Long term (current) use of oral hypoglycemic drugs: Secondary | ICD-10-CM | POA: Insufficient documentation

## 2019-06-24 DIAGNOSIS — Z8501 Personal history of malignant neoplasm of esophagus: Secondary | ICD-10-CM | POA: Insufficient documentation

## 2019-06-24 DIAGNOSIS — Z79899 Other long term (current) drug therapy: Secondary | ICD-10-CM | POA: Diagnosis not present

## 2019-06-24 DIAGNOSIS — E119 Type 2 diabetes mellitus without complications: Secondary | ICD-10-CM | POA: Insufficient documentation

## 2019-06-24 DIAGNOSIS — M7918 Myalgia, other site: Secondary | ICD-10-CM | POA: Diagnosis not present

## 2019-06-24 DIAGNOSIS — J189 Pneumonia, unspecified organism: Secondary | ICD-10-CM | POA: Diagnosis not present

## 2019-06-24 DIAGNOSIS — Z87891 Personal history of nicotine dependence: Secondary | ICD-10-CM | POA: Diagnosis not present

## 2019-06-24 DIAGNOSIS — Z7982 Long term (current) use of aspirin: Secondary | ICD-10-CM | POA: Insufficient documentation

## 2019-06-24 DIAGNOSIS — R5381 Other malaise: Secondary | ICD-10-CM | POA: Insufficient documentation

## 2019-06-24 DIAGNOSIS — R079 Chest pain, unspecified: Secondary | ICD-10-CM | POA: Diagnosis present

## 2019-06-24 DIAGNOSIS — Z96642 Presence of left artificial hip joint: Secondary | ICD-10-CM | POA: Diagnosis not present

## 2019-06-24 DIAGNOSIS — R5383 Other fatigue: Secondary | ICD-10-CM | POA: Insufficient documentation

## 2019-06-24 DIAGNOSIS — R0789 Other chest pain: Secondary | ICD-10-CM | POA: Diagnosis not present

## 2019-06-24 DIAGNOSIS — R0602 Shortness of breath: Secondary | ICD-10-CM | POA: Diagnosis not present

## 2019-06-24 DIAGNOSIS — R112 Nausea with vomiting, unspecified: Secondary | ICD-10-CM | POA: Insufficient documentation

## 2019-06-24 LAB — BASIC METABOLIC PANEL
Anion gap: 12 (ref 5–15)
BUN: 9 mg/dL (ref 8–23)
CO2: 21 mmol/L — ABNORMAL LOW (ref 22–32)
Calcium: 9.3 mg/dL (ref 8.9–10.3)
Chloride: 106 mmol/L (ref 98–111)
Creatinine, Ser: 0.95 mg/dL (ref 0.61–1.24)
GFR calc Af Amer: >60 mL/min (ref >60)
GFR calc non Af Amer: >60 mL/min (ref >60)
Glucose, Bld: 132 mg/dL — ABNORMAL HIGH (ref 70–99)
Potassium: 3.7 mmol/L (ref 3.5–5.1)
Sodium: 139 mmol/L (ref 135–145)

## 2019-06-24 LAB — D-DIMER, QUANTITATIVE: D-Dimer, Quant: 0.38 ug/mL-FEU (ref 0.00–0.50)

## 2019-06-24 LAB — HEPATIC FUNCTION PANEL
ALT: 30 U/L (ref 0–44)
AST: 46 U/L — ABNORMAL HIGH (ref 15–41)
Albumin: 4 g/dL (ref 3.5–5.0)
Alkaline Phosphatase: 73 U/L (ref 38–126)
Bilirubin, Direct: <0.1 mg/dL (ref 0.0–0.2)
Total Bilirubin: 0.5 mg/dL (ref 0.3–1.2)
Total Protein: 7.1 g/dL (ref 6.5–8.1)

## 2019-06-24 LAB — CBC
HCT: 41.5 % (ref 39.0–52.0)
Hemoglobin: 12.7 g/dL — ABNORMAL LOW (ref 13.0–17.0)
MCH: 23.3 pg — ABNORMAL LOW (ref 26.0–34.0)
MCHC: 30.6 g/dL (ref 30.0–36.0)
MCV: 76.1 fL — ABNORMAL LOW (ref 80.0–100.0)
Platelets: 293 10*3/uL (ref 150–400)
RBC: 5.45 MIL/uL (ref 4.22–5.81)
RDW: 15.2 % (ref 11.5–15.5)
WBC: 4.9 10*3/uL (ref 4.0–10.5)
nRBC: 0 % (ref 0.0–0.2)

## 2019-06-24 LAB — BRAIN NATRIURETIC PEPTIDE: B Natriuretic Peptide: 79.2 pg/mL (ref 0.0–100.0)

## 2019-06-24 LAB — SARS CORONAVIRUS 2 (TAT 6-24 HRS): SARS Coronavirus 2: NEGATIVE

## 2019-06-24 LAB — TROPONIN I (HIGH SENSITIVITY)
Troponin I (High Sensitivity): 17 ng/L (ref <18)
Troponin I (High Sensitivity): 15 ng/L (ref <18)

## 2019-06-24 LAB — LIPASE, BLOOD: Lipase: 31 U/L (ref 11–51)

## 2019-06-24 LAB — POC SARS CORONAVIRUS 2 AG -  ED: SARS Coronavirus 2 Ag: NEGATIVE

## 2019-06-24 MED ORDER — CEFDINIR 300 MG PO CAPS: 300.0000 mg | ORAL_CAPSULE | Freq: Two times a day (BID) | ORAL | 0 refills | Status: DC

## 2019-06-24 MED ORDER — DOXYCYCLINE HYCLATE 100 MG PO CAPS: 100.0000 mg | ORAL_CAPSULE | Freq: Two times a day (BID) | ORAL | 0 refills | Status: DC

## 2019-06-24 MED ORDER — ACETAMINOPHEN 325 MG PO TABS
650.0000 mg | ORAL_TABLET | Freq: Once | ORAL | Status: AC
  Administered 2019-06-24: 650 mg via ORAL
  Filled 2019-06-24: qty 2

## 2019-06-24 MED ORDER — SODIUM CHLORIDE 0.9% FLUSH: 3.0000 mL | Freq: Once | INTRAVENOUS | Status: DC

## 2019-06-24 NOTE — ED Provider Notes (Signed)
[facility] EMERGENCY DEPARTMENT Provider Note   CSN: [id number] Arrival date & time: 06/24/19  0456     History Chief Complaint  Patient presents with  . Chest Pain  . Leg Pain    Juan [name] is a 79 y.o. male.  The history is provided by the patient and medical records. No language interpreter was used.  Chest Pain Leg Pain  Juan [name] is a 78 y.o. male who presents to the Emergency Department complaining of chest pain. He presents to the emergency department complaining of chest pain as well as body aches. 1 to 2 weeks ago he developed generalized body aches and malaise. He has been experiencing occasional nausea and vomiting. He is unable to quantify the vomiting. He also describes associated what he calls chest pain but he points across his mid abdomen. The pain radiates to his back. He denies any diarrhea, constipation, fevers, dysuria. He does have some mild associated shortness of breath. He has a history of diabetes. No prior similar symptoms.    Past Medical History:  Diagnosis Date  . Aortic atherosclerosis ([place])   . Back pain   . Barrett esophagus   . Bleeding nose   . DDD (degenerative disc disease), thoracolumbar    Moderate to severe  . Diabetes mellitus without complication ([place])   . Diverticulosis   . Esophageal cancer ([place]) dx'd 2017  . GERD (gastroesophageal reflux disease)   . Headache   . Hearing loss    Right ear  . History of umbilical hernia repair   . Idiopathic peripheral neuropathy   . Iron deficiency anemia   . LBBB (left bundle branch block) 02/11/2016  . Left hip pain    Severe degenerative changes  . Lipoma of neck    Right  . Mixed hyperlipidemia   . PONV (postoperative nausea and vomiting)   . Reflux   . Right thyroid nodule     Patient Active Problem List   Diagnosis Date Noted  . Osteoarthritis of left hip 07/22/2017  . Iron deficiency anemia 05/17/2017  . LBBB (left bundle branch block) 02/11/2016  . Port  catheter in place 12/25/2015  . 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 Esophageal cancer ([place]) 11/22/2015  . Noise effect on both inner ears 08/29/2015  . Right thyroid nodule 08/29/2015    Past Surgical History:  Procedure Laterality Date  . BALLOON DILATION N/A 05/06/2018   Procedure: BALLOON DILATION;  Surgeon: Carol Ada, MD;  Location: [facility];  Service: Endoscopy;  Laterality: N/A;  . COLONOSCOPY    . ESOPHAGOGASTRODUODENOSCOPY (EGD) WITH PROPOFOL N/A 05/06/2018   Procedure: ESOPHAGOGASTRODUODENOSCOPY (EGD) WITH PROPOFOL;  Surgeon: Carol Ada, MD;  [facility]: [facility];  Service: Endoscopy;  Laterality: N/A;  . EUS N/A 12/05/2015   Procedure: UPPER ENDOSCOPIC ULTRASOUND (EUS) LINEAR;  Surgeon: Carol Ada, MD;  Location[facility];  Service: Endoscopy;  Laterality: N/A;  . EUS N/A 03/10/2016   Procedure: UPPER ENDOSCOPIC ULTRASOUND (EUS) LINEAR;  Surgeon: Carol Ada, MD;  Location: [facility];  Service: Endoscopy;  Laterality: N/A;  . EUS N/A 01/07/2017   Procedure: UPPER ENDOSCOPIC ULTRASOUND (EUS) LINEAR;  Surgeon: Carol Ada, MD;  Location: [facility];  Service: Endoscopy;  Laterality: N/A;  . HERNIA REPAIR    . IR GENERIC HISTORICAL  12/24/2015   IR FLUORO GUIDE PORT INSERTION RIGHT 12/24/2015 Arne Cleveland, MD [facility]  . IR GENERIC HISTORICAL  12/24/2015   IR US GUIDE VASC ACCESS  RIGHT 12/24/2015 Arne Cleveland, MD [facility]  . IR REMOVAL TUN ACCESS W/ PORT W/O FL MOD SED  02/07/2018  . PORTA CATH INSERTION    . SHOULDER ARTHROSCOPY W/ SUPERIOR LABRAL ANTERIOR POSTERIOR LESION REPAIR     times 2  . TOTAL HIP ARTHROPLASTY Left 07/22/2017   Procedure: LEFT TOTAL HIP ARTHROPLASTY ANTERIOR APPROACH;  Surgeon: Rod Can, MD;  Location: [facility];  Service: Orthopedics;  Laterality: Left;  . UPPER GI ENDOSCOPY  01/07/2017       Family History  Problem Relation Age of Onset  . Colon cancer Father 67  .  Colon cancer Brother 33  . Heart attack Brother   . Heart disease Mother   . Heart attack Mother   . Diabetes Sister   . Stroke Sister     Social History   Tobacco Use  . Smoking status: Former Smoker    Packs/day: 2.00    Years: 40.00    Pack years: 80.00    Quit date: 03/31/1999    Years since quitting: 20.2  . Smokeless tobacco: Never Used  Substance Use Topics  . Alcohol use: Not Currently  . Drug use: No    Home Medications Prior to Admission medications   Medication Sig Start Date End Date Taking? Authorizing Provider  amitriptyline (ELAVIL) 10 MG tablet Take 10 mg by mouth at bedtime. 03/11/18   [provider]  aspirin EC 81 MG tablet Take 81 mg by mouth daily.    [provider]  cefdinir (OMNICEF) 300 MG capsule Take 1 capsule (300 mg total) by mouth 2 (two) times daily. 06/24/19   Quintella Reichert, MD  doxycycline (VIBRAMYCIN) 100 MG capsule Take 1 capsule (100 mg total) by mouth 2 (two) times daily. 06/24/19   Quintella Reichert, MD  furosemide (LASIX) 20 MG tablet Take 1 tablet (20 mg total) by mouth daily. 07/02/18   [name], MD  gabapentin (NEURONTIN) 300 MG capsule Take 300 mg by mouth 2 (two) times daily.    [provider]  glimepiride (AMARYL) 1 MG tablet Take 1 mg by mouth daily after breakfast.  02/22/18   [provider]  LIPITOR 20 MG tablet Take 20 mg by mouth every evening. 02/21/18   [provider]  oxyCODONE-acetaminophen (PERCOCET) 10-325 MG tablet  01/11/19   [provider]  pantoprazole (PROTONIX) 40 MG tablet Take 40 mg by mouth daily. 02/21/18   [provider]  tiZANidine (ZANAFLEX) 2 MG tablet Take 2 mg by mouth 2 (two) times daily as needed for muscle spasms. 02/22/18   [provider]    Allergies    Patient has no known allergies.  Review of Systems   Review of Systems  Cardiovascular: Positive for chest pain.  All other systems reviewed and are  negative.   Physical Exam Updated Vital Signs BP 125/67   Pulse (!) 51   Temp 98 F (36.7 C) (Oral)   Resp 13   SpO2 99%   Physical Exam Vitals and nursing note reviewed.  Constitutional:      Appearance: He is well-developed.  HENT:     Head: Normocephalic and atraumatic.  Cardiovascular:     Rate and Rhythm: Normal rate and regular rhythm.     Heart sounds: No murmur.  Pulmonary:     Effort: Pulmonary effort is normal. No respiratory distress.     Breath sounds: Normal breath sounds.  Abdominal:     Palpations: Abdomen is soft.  Tenderness: There is no abdominal tenderness. There is no guarding or rebound.  Musculoskeletal:        General: No swelling or tenderness.  Skin:    General: Skin is warm and dry.  Neurological:     Mental Status: He is alert and oriented to person, place, and time.  Psychiatric:        Behavior: Behavior normal.     ED Results / Procedures / Treatments   Labs (all labs ordered are listed, but only abnormal results are displayed) Labs Reviewed  BASIC METABOLIC PANEL - Abnormal; Notable for the following components:      Result Value   CO2 21 (*)    Glucose, Bld 132 (*)    All other components within normal limits  CBC - Abnormal; Notable for the following components:   Hemoglobin 12.7 (*)    MCV 76.1 (*)    MCH 23.3 (*)    All other components within normal limits  HEPATIC FUNCTION PANEL - Abnormal; Notable for the following components:   AST 46 (*)    All other components within normal limits  SARS CORONAVIRUS 2 (TAT 6-24 HRS)  LIPASE, BLOOD  BRAIN NATRIURETIC PEPTIDE  D-DIMER, QUANTITATIVE (NOT AT [facility])  POC SARS CORONAVIRUS 2 AG -  ED  TROPONIN I (HIGH SENSITIVITY)  TROPONIN I (HIGH SENSITIVITY)    EKG EKG Interpretation  Date/Time:  Saturday June 24 2019 05:05:03 EDT Ventricular Rate:  65 PR Interval:  186 QRS Duration: 146 QT Interval:  480 QTC Calculation: 499 R Axis:   -40 Text Interpretation: Sinus  rhythm with Premature atrial complexes Left axis deviation Left bundle branch block Abnormal ECG Confirmed by Quintella Reichert [phone]) on 06/24/2019 7:10:20 AM   Radiology DG Chest 2 View  Result Date: 06/24/2019 CLINICAL DATA:  Chest pain for 1 week. Has gotten worse tonight with nausea. EXAM: CHEST - 2 VIEW COMPARISON:  03/08/2019 FINDINGS: There is bilateral mild interstitial thickening which may reflect mild interstitial edema versus atypical infection. There is no focal consolidation. There is no pleural effusion or pneumothorax. The heart and mediastinal contours are unremarkable. There is no acute osseous abnormality. IMPRESSION: Bilateral mild interstitial thickening which may reflect mild interstitial edema versus atypical infection. Electronically Signed   By: Kathreen Devoid   On: 06/24/2019 06:00    Procedures Procedures (including critical care time)  Medications Ordered in ED Medications  sodium chloride flush (NS) 0.9 % injection 3 mL (3 mLs Intravenous Not Given 06/24/19 0733)  acetaminophen (TYLENOL) tablet 650 mg (650 mg Oral Given 06/24/19 [id number])    ED Course  I have reviewed the triage vital signs and the nursing notes.  Pertinent labs & imaging results that were available during my care of the patient were reviewed by me and considered in my medical decision making (see chart for details).    MDM Rules/Calculators/A&P                     Pt here for evaluation of body aches, chest/abdominal discomfort for 1-2 weeks as well as malaise/fatigue.  He is nontoxic appearing on exam with no respiratory distress.  EKG unchanged from prior and troponin neg x 2.  Presentation is not c/w ACS, PE, dissection, acute abdomen, acute CHF.  CXR with possible atypical infection - will treat for possible CAP due to patient's sxs.  D/w pt importance of PCP follow up and return precautions.    Final Clinical Impression(s) / ED Diagnoses Final diagnoses:  Atypical chest pain  Community acquired  pneumonia, unspecified laterality  Malaise and fatigue    Rx / DC Orders ED Discharge Orders         Ordered    doxycycline (VIBRAMYCIN) 100 MG capsule  2 times daily     06/24/19 1030    cefdinir (OMNICEF) 300 MG capsule  2 times daily     06/24/19 1030           Quintella Reichert, MD 06/24/19 [phone]

## 2019-06-24 NOTE — ED Notes (Signed)
Pt discharge instructions reviewed with the patient. The patient verbalized understanding of both. Pt discharged.

## 2019-06-24 NOTE — ED Triage Notes (Signed)
Pt said he has been having chest pain x 1 week that has gotten worse tonight with nausea, vomiting and is radiating into his left arm Pt also having leg pain in his right calf. Some SOB

## 2019-07-07 NOTE — Progress Notes (Signed)
[place]   Telephone:([phone] [phone]   Clinic Follow up Note   Patient Care Team: Liberal, [name]), MD (Inactive) as PCP - General (Internal Medicine) [name], MD as Consulting Physician (Hematology) Kyung Rudd, MD as Consulting Physician (Radiation Oncology) Juanita Craver, MD as Consulting Physician (Gastroenterology)  Date of Service:  07/13/2019  CHIEF COMPLAINT: Follow up esophageal squamous cell carcinoma  SUMMARY OF ONCOLOGIC HISTORY: Oncology History Overview Note  Presented with dysphagia and odynophagia with 20-25 pounds of weight loss in about 3-4 months  Esophageal cancer ([place])   Staging form: Esophagus - Adenocarcinoma, AJCC 7th Edition   - Clinical stage from 11/13/2015: Stage IIIB (T3, N2, M0, G2) - Signed by [name], MD on 12/11/2015    Esophageal cancer ([place])  11/13/2015 Procedure   UPPER ENDOSCOPY per Dr. Collene Mares: Large fungating, friable bleeding mass in middle third of esophagus, 22cm from incisors and extended to 27cm. Non-obstructing.   11/13/2015 Pathology Results   Invasive squamous cell carcinoma; moderately differentiated   11/13/2015 Imaging   CT ABD/PELVIS: IMPRESSION:No evidence of metastatic disease in the abdomen or pelvis. 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 Aortoiliac atherosclerosis. Small bilateral inguinal hernias containing fat the   11/22/2015 Initial Diagnosis   Esophageal cancer ([place])   12/03/2015 Imaging   PET scan showed a long segment of hypermetabolic thickening in the mid esophagus consistent with esophageal carcinoma. No clear evidence of node metastasis or distant metastasis.   12/10/2015 - 01/14/2016 Radiation Therapy   Neoadjuvant radiation to his esophageal cancer   12/10/2015 - 01/15/2016 Chemotherapy   Neoadjuvant weekly carboplatin AUC 2, and Taxol 45 mg/m, with concurrent radiation. He developed steroid-induced psychosis, and Taxol was changed to Abraxane to avoid  premedication with steroids.   02/24/2016 Imaging   CT CAP with contrast showed interval improvement mid esophageal mass, no evidence for metastatic adenopathy or distant metastasis.    03/10/2016 Procedure   Repeat EGD by Dr. Benson Norway showed wall thickening in the thoracic esophagus with the tumor was. The thickness decreased from 12.8 mm to 6-7 mm. Not able to differentiate between actual tumor versus fibrosis from radiation. Some peritumoral shoddy lymph nodes.   04/07/2016 Surgery   Patient followed up with Dr. Servando Snare, patient is not a great candidate for surgery, pt also does not want surgery, mutually agreed to not pursue esophagectomy.    06/01/2016 Imaging   CT C/A/P IMPRESSION: Mild mid esophageal wall thickening without residual mass on CT. Radiation changes in the paramediastinal lung bilaterally. No evidence of recurrent or metastatic disease.   08/18/2016 -  Hospital Admission   Patient presented to the ER with SOB and complaints of chest pain   11/07/2016 Imaging   Nm Pet Image Restag IMPRESSION: Negative PET-CT. No findings for recurrent esophageal cancer or metastatic disease.   01/07/2017 Procedure   EUS by Dr. Benson Norway 01/07/17 IMPRESSION - Normal esophagus. - Normal stomach. - Normal examined duodenum. - Wall thickening was seen in the upper third of the esophagus and in the middle third of the esophagus. - One benign lymph node was visualized in the middle paraesophageal mediastinum (level 41M). - No specimens collected.   05/10/2017 Imaging   CT CAP W Contrast 05/10/17 IMPRESSION: 1. No new or progressive findings to suggest recurrent or metastatic disease in the chest or abdomen on today's study. 2.  Aortic Atherosclerois (ICD10-170.0)   01/14/2018 Imaging   01/14/2018 CT Neck IMPRESSION: 1. No evidence of cervical lymphadenopathy. 2. 5 cm  right neck lipoma, mildly enlarged from 2010.   01/14/2018 Imaging   01/14/2018 CT CAP IMPRESSION: 1. Interval  development of several tiny bilateral pulmonary nodules. While indeterminate and potentially related to infectious/inflammatory etiology, close attention recommended as early metastatic disease not excluded. 2. Otherwise stable exam. 3.  Emphysema. (ICD10-J43.9) 4.  Aortic Atherosclerois (ICD10-170.0)   01/25/2019 Imaging   CT AP W contrast  IMPRESSION: No evidence for residual or recurrent tumor within the abdomen or pelvis.     05/07/2019 Procedure   Upper Endoscopy by Dr Benson Norway  IMPRESSION - Benign-appearing esophageal stenosis. Dilated. - Normal stomach. - Normal examined duodenum. - No specimens collected.      CURRENT THERAPY:  Observation  INTERVAL HISTORY:  Juan [name] is here for a follow up of esophageal cancer. He was last seen by me 6 months ago. He presents to the clinic alone. He notes he is doing well. He notes his left hip pain is manageable, s/p hip surgery. He denies stomach issues. He denies bloating or nausea. He notes occasional SOB, no chest pain. He still works in [employer]. He notes if he swallows too much it can be difficult but no pain. He overall can swallow everything. I reviewed his medication list with him. He notes 2 weeks ago he had PNA, treated with antibiotics. He notes he has been taking Gabapentin BID for his back pain. He notes he has had a nodule on his thyroid for 3-4 years. His PCP has palpated this as well.    REVIEW OF SYSTEMS:   Constitutional: Denies fevers, chills or abnormal weight loss Eyes: Denies blurriness of vision Ears, nose, mouth, throat, and face: Denies mucositis or sore throat Respiratory: Denies cough or wheezes (+) Occasional SOB  Cardiovascular: Denies palpitation, chest discomfort or lower extremity swelling Gastrointestinal:  Denies nausea, heartburn or change in bowel habits Skin: Denies abnormal skin rashes MSK: (+) Left hip pain (+) back pain  Lymphatics: Denies new lymphadenopathy or easy  bruising Neurological:Denies numbness, tingling or new weaknesses Behavioral/Psych: Mood is stable, no new changes  All other systems were reviewed with the patient and are negative.  MEDICAL HISTORY:  Past Medical History:  Diagnosis Date  . Aortic atherosclerosis ([place])   . Back pain   . Barrett esophagus   . Bleeding nose   . DDD (degenerative disc disease), thoracolumbar    Moderate to severe  . Diabetes mellitus without complication ([place])   . Diverticulosis   . Esophageal cancer ([place]) dx'd 2017  . GERD (gastroesophageal reflux disease)   . Headache   . Hearing loss    Right ear  . History of umbilical hernia repair   . Idiopathic peripheral neuropathy   . Iron deficiency anemia   . LBBB (left bundle branch block) 02/11/2016  . Left hip pain    Severe degenerative changes  . Lipoma of neck    Right  . Mixed hyperlipidemia   . PONV (postoperative nausea and vomiting)   . Reflux   . Right thyroid nodule     SURGICAL HISTORY: Past Surgical History:  Procedure Laterality Date  . BALLOON DILATION N/A 05/06/2018   Procedure: BALLOON DILATION;  Surgeon: Carol Ada, MD;  Location: [facility];  Service: Endoscopy;  Laterality: N/A;  . COLONOSCOPY    . ESOPHAGOGASTRODUODENOSCOPY (EGD) WITH PROPOFOL N/A 05/06/2018   Procedure: ESOPHAGOGASTRODUODENOSCOPY (EGD) WITH PROPOFOL;  Surgeon: Carol Ada, MD;  Location: [facility];  Service: Endoscopy;  Laterality: N/A;  . EUS N/A 12/05/2015   Procedure:  UPPER ENDOSCOPIC ULTRASOUND (EUS) LINEAR;  Surgeon: Carol Ada, MD;  Location: [facility];  Service: Endoscopy;  Laterality: N/A;  . EUS N/A 03/10/2016   Procedure: UPPER ENDOSCOPIC ULTRASOUND (EUS) LINEAR;  Surgeon: Carol Ada, MD;  Location: [facility];  Service: Endoscopy;  Laterality: N/A;  . EUS N/A 01/07/2017   Procedure: UPPER ENDOSCOPIC ULTRASOUND (EUS) LINEAR;  Surgeon: Carol Ada, MD;  Location: [facility];  Service: Endoscopy;  Laterality: N/A;  . HERNIA  REPAIR    . IR GENERIC HISTORICAL  12/24/2015   IR FLUORO GUIDE PORT INSERTION RIGHT 12/24/2015 Arne Cleveland, MD [facility]  . IR GENERIC HISTORICAL  12/24/2015   IR US GUIDE VASC ACCESS RIGHT 12/24/2015 Arne Cleveland, MD [facility]  . IR REMOVAL TUN ACCESS W/ PORT W/O FL MOD SED  02/07/2018  . PORTA CATH INSERTION    . SHOULDER ARTHROSCOPY W/ SUPERIOR LABRAL ANTERIOR POSTERIOR LESION REPAIR     times 2  . TOTAL HIP ARTHROPLASTY Left 07/22/2017   Procedure: LEFT TOTAL HIP ARTHROPLASTY ANTERIOR APPROACH;  Surgeon: Rod Can, MD;  Location: [facility];  Service: Orthopedics;  Laterality: Left;  . UPPER GI ENDOSCOPY  01/07/2017    I have reviewed the social history and family history with the patient and they are unchanged from previous note.  ALLERGIES:  has No Known Allergies.  MEDICATIONS:  Current Outpatient Medications  Medication Sig Dispense Refill  . albuterol (VENTOLIN HFA) 108 (90 Base) MCG/ACT inhaler     . amitriptyline (ELAVIL) 10 MG tablet Take 10 mg by mouth at bedtime.    Marland Kitchen aspirin EC 81 MG tablet Take 81 mg by mouth daily.    . celecoxib (CELEBREX) 200 MG capsule Take 200 mg by mouth daily.    . furosemide (LASIX) 20 MG tablet Take 1 tablet (20 mg total) by mouth daily. 5 tablet 0  . gabapentin (NEURONTIN) 300 MG capsule Take 300 mg by mouth 2 (two) times daily.    Marland Kitchen glimepiride (AMARYL) 1 MG tablet Take 1 mg by mouth daily after breakfast.     . LIPITOR 20 MG tablet Take 20 mg by mouth every evening.    Marland Kitchen oxyCODONE-acetaminophen (PERCOCET) 10-325 MG tablet     . pantoprazole (PROTONIX) 40 MG tablet Take 40 mg by mouth daily.    Marland Kitchen tiZANidine (ZANAFLEX) 2 MG tablet Take 2 mg by mouth 2 (two) times daily as needed for muscle spasms.     No current facility-administered medications for this visit.    PHYSICAL EXAMINATION: ECOG PERFORMANCE STATUS: 1 - Symptomatic but completely ambulatory  Vitals:   07/13/19 1301  BP: (!) 142/87  Pulse: (!) 59  Resp: 20   Temp: 98.2 F (36.8 C)  SpO2: 99%   Filed Weights   07/13/19 1301  Weight: 203 lb 3.2 oz (92.2 kg)    GENERAL:alert, no distress and comfortable SKIN: skin color, texture, turgor are normal, no rashes or significant lesions EYES: normal, Conjunctiva are pink and non-injected, sclera clear OROPHARYNX:no exudate, no erythema and lips, buccal mucosa, and tongue normal  NECK:  (+) palpable 3.5x4cm Right upper thyroid nodule  LYMPH:  no palpable lymphadenopathy in the cervical, axillary  LUNGS: clear to auscultation and percussion with normal breathing effort HEART: regular rate & rhythm and no murmurs and no lower extremity edema ABDOMEN:abdomen soft, non-tender and normal bowel sounds Musculoskeletal:no cyanosis of digits and no clubbing  NEURO: alert & oriented x 3 with fluent speech, no focal motor/sensory deficits  LABORATORY DATA:  I have reviewed the data as listed CBC Latest Ref Rng & Units 07/13/2019 06/24/2019 01/11/2019  WBC 4.0 - 10.5 K/uL 4.2 4.9 4.8  Hemoglobin 13.0 - 17.0 g/dL 12.5(L) 12.7(L) 12.8(L)  Hematocrit 39.0 - 52.0 % 40.5 41.5 41.5  Platelets 150 - 400 K/uL 260 293 283     CMP Latest Ref Rng & Units 07/13/2019 06/24/2019 01/11/2019  Glucose 70 - 99 mg/dL 85 132(H) 138(H)  BUN 8 - 23 mg/dL 10 9 13   Creatinine 0.61 - 1.24 mg/dL 1.09 0.95 1.07  Sodium 135 - 145 mmol/L 141 139 140  Potassium 3.5 - 5.1 mmol/L 4.1 3.7 4.4  Chloride 98 - 111 mmol/L 108 106 106  CO2 22 - 32 mmol/L 24 21(L) 26  Calcium 8.9 - 10.3 mg/dL 8.9 9.3 9.1  Total Protein 6.5 - 8.1 g/dL 7.4 7.1 7.4  Total Bilirubin 0.3 - 1.2 mg/dL 0.5 0.5 0.5  Alkaline Phos 38 - 126 U/L 73 73 88  AST 15 - 41 U/L 34 46(H) 25  ALT 0 - 44 U/L 21 30 18       RADIOGRAPHIC STUDIES: I have personally reviewed the radiological images as listed and agreed with the findings in the report. No results found.   ASSESSMENT & PLAN:  Eder Strome is a 79 y.o. male with    1. Esophageal cancer, squamous cell  carcinoma, cT3N1-2M0, stage IIIB, s/p chemoRT only  -He was diagnosed in 10/2015. EUS revealed a T3 lesion, N1 vs N2, locally advanced disease. -He was treated with concurrent chemoRT with weekly carboplatin and Taxol. Taxol was changed to Abraxane due to steroids induced psychosis -Patient and Dr. Servando Snare has mutually agreed not to pursue esophagectomy due to his advanced age and general health condition -We previously discussed that his esophageal cancer is unlikely cured by chemoradiation alone without surgery. I reviewed his repeated EUS after his chemotherapy and irradiation, which showed good response to chemoradiation, but possible residual tumor. -Following surveillance scans have been NED.  -He is clinically doing well. Labs reviewed, CBC and CMP WNL except MCV 75.7. Physical exam unremarkable except palpable thyroid nodule. There is no clinical concern for recurrence.  -He is still swallowing adequately with no diet restrictions. No new pain or concerns.  -He is over 3.5 years since his diagnosis. His risk of recurrence is very small. Continue with 5 year surveillance plan. Last surveillance CT scan in 6 months -F/u in 6 months.    2. History of steroids induced psychosis -Resolved now. We'll avoid steroids in the future.  3. Leukopenia and anemia  -Secondary to chemotherapy radiation -We'll continue monitoring, no need for blood transfusion  4. Chronic back pain, left hip pain and leg pain  -I previously encouraged the patient to follow with PCP and orthopedic surgeon  -He underwent left total hip arthroplasty on 07/22/17. Pain much improved. His overall MSK pain is manageable.  -Managed with Gabapentin and oxycodone. I do not refill his narcotics.   5. H/o COVID-19  -Tested positive in 09/2018. Has recovered well. No symptoms remaining.   6. Right neck mass  -He has large right upper neck mass 3.5x4cm on exam today (07/13/19), likely a large thyroid nodule or lipoma.  -Per  pt he has had this for 3-4 years. He notes his PCP palpated this before.  -This is likely benign. I recommend neck US to further evaluate for him. He is agreeable.    PLAN:  -neck US next week  -F/u in 6 months with lab  and CT CAP a few days before.   No problem-specific Assessment & Plan notes found for this encounter.   Orders Placed This Encounter  Procedures  . US Soft Tissue Head/Neck    Standing Status:   Future    Standing Expiration Date:   07/12/2020    Order Specific Question:   Reason for Exam (SYMPTOM  OR DIAGNOSIS REQUIRED)    Answer:   4cm right mid neck mass, ? thyroid nodule    Order Specific Question:   Preferred imaging location?    Answer:   [facility]  . CT Abdomen Pelvis W Contrast    Standing Status:   Future    Standing Expiration Date:   07/12/2020    Order Specific Question:   If indicated for the ordered procedure, I authorize the administration of contrast media per Radiology protocol    Answer:   Yes    Order Specific Question:   Preferred imaging location?    Answer:   [facility]    Order Specific Question:   Is Oral Contrast requested for this exam?    Answer:   Yes, Per Radiology protocol    Order Specific Question:   Radiology Contrast Protocol - do NOT remove file path    Answer:   \\[email or web address]  . CT Chest W Contrast    Standing Status:   Future    Standing Expiration Date:   07/12/2020    Order Specific Question:   If indicated for the ordered procedure, I authorize the administration of contrast media per Radiology protocol    Answer:   Yes    Order Specific Question:   Preferred imaging location?    Answer:   [facility]    Order Specific Question:   Radiology Contrast Protocol - do NOT remove file path    Answer:   \\[email or web address]   All questions were answered. The patient knows to call the clinic with any problems, questions or concerns. No barriers to  learning was detected. The total time spent in the appointment was 30 minutes.     [name], MD 07/13/2019   I, Joslyn Devon, am acting as scribe for [name], MD.   I have reviewed the above documentation for accuracy and completeness, and I agree with the above.

## 2019-07-13 ENCOUNTER — Encounter: Payer: Self-pay | Admitting: Hematology

## 2019-07-13 ENCOUNTER — Inpatient Hospital Stay: Payer: 59 | Attending: Hematology

## 2019-07-13 ENCOUNTER — Other Ambulatory Visit: Payer: Self-pay

## 2019-07-13 ENCOUNTER — Inpatient Hospital Stay (HOSPITAL_BASED_OUTPATIENT_CLINIC_OR_DEPARTMENT_OTHER): Payer: 59 | Admitting: Hematology

## 2019-07-13 VITALS — BP 142/87 | HR 59 | Temp 98.2°F | Resp 20 | Ht 69.0 in | Wt 203.2 lb

## 2019-07-13 DIAGNOSIS — R221 Localized swelling, mass and lump, neck: Secondary | ICD-10-CM | POA: Diagnosis not present

## 2019-07-13 DIAGNOSIS — C154 Malignant neoplasm of middle third of esophagus: Secondary | ICD-10-CM

## 2019-07-13 DIAGNOSIS — E041 Nontoxic single thyroid nodule: Secondary | ICD-10-CM | POA: Insufficient documentation

## 2019-07-13 DIAGNOSIS — D6481 Anemia due to antineoplastic chemotherapy: Secondary | ICD-10-CM | POA: Insufficient documentation

## 2019-07-13 DIAGNOSIS — Z8501 Personal history of malignant neoplasm of esophagus: Secondary | ICD-10-CM | POA: Insufficient documentation

## 2019-07-13 LAB — CBC WITH DIFFERENTIAL/PLATELET
Abs Immature Granulocytes: 0 10*3/uL (ref 0.00–0.07)
Basophils Absolute: 0 10*3/uL (ref 0.0–0.1)
Basophils Relative: 1 %
Eosinophils Absolute: 0.2 10*3/uL (ref 0.0–0.5)
Eosinophils Relative: 5 %
HCT: 40.5 % (ref 39.0–52.0)
Hemoglobin: 12.5 g/dL — ABNORMAL LOW (ref 13.0–17.0)
Immature Granulocytes: 0 %
Lymphocytes Relative: 40 %
Lymphs Abs: 1.7 10*3/uL (ref 0.7–4.0)
MCH: 23.4 pg — ABNORMAL LOW (ref 26.0–34.0)
MCHC: 30.9 g/dL (ref 30.0–36.0)
MCV: 75.7 fL — ABNORMAL LOW (ref 80.0–100.0)
Monocytes Absolute: 0.4 10*3/uL (ref 0.1–1.0)
Monocytes Relative: 9 %
Neutro Abs: 1.9 10*3/uL (ref 1.7–7.7)
Neutrophils Relative %: 45 %
Platelets: 260 10*3/uL (ref 150–400)
RBC: 5.35 MIL/uL (ref 4.22–5.81)
RDW: 15.4 % (ref 11.5–15.5)
WBC: 4.2 10*3/uL (ref 4.0–10.5)
nRBC: 0 % (ref 0.0–0.2)

## 2019-07-13 LAB — COMPREHENSIVE METABOLIC PANEL
ALT: 21 U/L (ref 0–44)
AST: 34 U/L (ref 15–41)
Albumin: 4 g/dL (ref 3.5–5.0)
Alkaline Phosphatase: 73 U/L (ref 38–126)
Anion gap: 9 (ref 5–15)
BUN: 10 mg/dL (ref 8–23)
CO2: 24 mmol/L (ref 22–32)
Calcium: 8.9 mg/dL (ref 8.9–10.3)
Chloride: 108 mmol/L (ref 98–111)
Creatinine, Ser: 1.09 mg/dL (ref 0.61–1.24)
GFR calc Af Amer: >60 mL/min (ref >60)
GFR calc non Af Amer: >60 mL/min (ref >60)
Glucose, Bld: 85 mg/dL (ref 70–99)
Potassium: 4.1 mmol/L (ref 3.5–5.1)
Sodium: 141 mmol/L (ref 135–145)
Total Bilirubin: 0.5 mg/dL (ref 0.3–1.2)
Total Protein: 7.4 g/dL (ref 6.5–8.1)

## 2019-07-14 ENCOUNTER — Telehealth: Payer: Self-pay | Admitting: Hematology

## 2019-07-14 NOTE — Telephone Encounter (Signed)
Scheduled appt per 4/15 los.  Printed calendar and avs. 

## 2019-07-15 ENCOUNTER — Encounter (HOSPITAL_COMMUNITY): Payer: Self-pay | Admitting: Emergency Medicine

## 2019-07-15 ENCOUNTER — Emergency Department (HOSPITAL_COMMUNITY): Payer: 59

## 2019-07-15 ENCOUNTER — Other Ambulatory Visit: Payer: Self-pay

## 2019-07-15 ENCOUNTER — Emergency Department (HOSPITAL_COMMUNITY)
Admission: EM | Admit: 2019-07-15 | Discharge: 2019-07-16 | Disposition: A | Discharge status: Home / Self Care | Payer: 59 | Attending: Emergency Medicine | Admitting: Emergency Medicine

## 2019-07-15 DIAGNOSIS — R531 Weakness: Secondary | ICD-10-CM | POA: Diagnosis not present

## 2019-07-15 DIAGNOSIS — R079 Chest pain, unspecified: Secondary | ICD-10-CM

## 2019-07-15 DIAGNOSIS — Z7984 Long term (current) use of oral hypoglycemic drugs: Secondary | ICD-10-CM | POA: Insufficient documentation

## 2019-07-15 DIAGNOSIS — R1012 Left upper quadrant pain: Secondary | ICD-10-CM | POA: Diagnosis not present

## 2019-07-15 DIAGNOSIS — E119 Type 2 diabetes mellitus without complications: Secondary | ICD-10-CM | POA: Diagnosis not present

## 2019-07-15 DIAGNOSIS — Z7982 Long term (current) use of aspirin: Secondary | ICD-10-CM | POA: Insufficient documentation

## 2019-07-15 DIAGNOSIS — Z96642 Presence of left artificial hip joint: Secondary | ICD-10-CM | POA: Insufficient documentation

## 2019-07-15 DIAGNOSIS — R0602 Shortness of breath: Secondary | ICD-10-CM | POA: Diagnosis not present

## 2019-07-15 DIAGNOSIS — R0789 Other chest pain: Secondary | ICD-10-CM | POA: Insufficient documentation

## 2019-07-15 DIAGNOSIS — Z87891 Personal history of nicotine dependence: Secondary | ICD-10-CM | POA: Diagnosis not present

## 2019-07-15 LAB — URINALYSIS, ROUTINE W REFLEX MICROSCOPIC
Bilirubin Urine: NEGATIVE
Glucose, UA: NEGATIVE mg/dL
Hgb urine dipstick: NEGATIVE
Ketones, ur: NEGATIVE mg/dL
Leukocytes,Ua: NEGATIVE
Nitrite: NEGATIVE
Protein, ur: NEGATIVE mg/dL
Specific Gravity, Urine: 1.006 (ref 1.005–1.030)
pH: 7 (ref 5.0–8.0)

## 2019-07-15 LAB — HEPATIC FUNCTION PANEL
ALT: 23 U/L (ref 0–44)
AST: 55 U/L — ABNORMAL HIGH (ref 15–41)
Albumin: 3.8 g/dL (ref 3.5–5.0)
Alkaline Phosphatase: 64 U/L (ref 38–126)
Bilirubin, Direct: 0.5 mg/dL — ABNORMAL HIGH (ref 0.0–0.2)
Indirect Bilirubin: 0.7 mg/dL (ref 0.3–0.9)
Total Bilirubin: 1.2 mg/dL (ref 0.3–1.2)
Total Protein: 6.6 g/dL (ref 6.5–8.1)

## 2019-07-15 LAB — CBC
HCT: 41.9 % (ref 39.0–52.0)
Hemoglobin: 12.5 g/dL — ABNORMAL LOW (ref 13.0–17.0)
MCH: 22.9 pg — ABNORMAL LOW (ref 26.0–34.0)
MCHC: 29.8 g/dL — ABNORMAL LOW (ref 30.0–36.0)
MCV: 76.6 fL — ABNORMAL LOW (ref 80.0–100.0)
Platelets: 284 10*3/uL (ref 150–400)
RBC: 5.47 MIL/uL (ref 4.22–5.81)
RDW: 15.4 % (ref 11.5–15.5)
WBC: 3.5 10*3/uL — ABNORMAL LOW (ref 4.0–10.5)
nRBC: 0 % (ref 0.0–0.2)

## 2019-07-15 LAB — TROPONIN I (HIGH SENSITIVITY)
Troponin I (High Sensitivity): 10 ng/L (ref <18)
Troponin I (High Sensitivity): 12 ng/L (ref <18)

## 2019-07-15 LAB — BASIC METABOLIC PANEL
Anion gap: 9 (ref 5–15)
BUN: 6 mg/dL — ABNORMAL LOW (ref 8–23)
CO2: 25 mmol/L (ref 22–32)
Calcium: 9 mg/dL (ref 8.9–10.3)
Chloride: 104 mmol/L (ref 98–111)
Creatinine, Ser: 0.96 mg/dL (ref 0.61–1.24)
GFR calc Af Amer: >60 mL/min (ref >60)
GFR calc non Af Amer: >60 mL/min (ref >60)
Glucose, Bld: 102 mg/dL — ABNORMAL HIGH (ref 70–99)
Potassium: 4.1 mmol/L (ref 3.5–5.1)
Sodium: 138 mmol/L (ref 135–145)

## 2019-07-15 MED ORDER — OXYCODONE-ACETAMINOPHEN 5-325 MG PO TABS
1.0000 | ORAL_TABLET | Freq: Once | ORAL | Status: AC
  Administered 2019-07-15: 1 via ORAL
  Filled 2019-07-15: qty 1

## 2019-07-15 MED ORDER — SODIUM CHLORIDE 0.9% FLUSH: 3.0000 mL | Freq: Once | INTRAVENOUS | Status: DC

## 2019-07-15 MED ORDER — ALUM & MAG HYDROXIDE-SIMETH 200-200-20 MG/5ML PO SUSP
30.0000 mL | Freq: Once | ORAL | Status: AC
  Administered 2019-07-15: 30 mL via ORAL
  Filled 2019-07-15: qty 30

## 2019-07-15 MED ORDER — IOHEXOL 350 MG/ML SOLN: 100.0000 mL | Freq: Once | INTRAVENOUS | Status: DC | PRN

## 2019-07-15 MED ORDER — PANTOPRAZOLE SODIUM 40 MG IV SOLR
40.0000 mg | Freq: Once | INTRAVENOUS | Status: AC
  Administered 2019-07-15: 40 mg via INTRAVENOUS
  Filled 2019-07-15: qty 40

## 2019-07-15 NOTE — ED Provider Notes (Signed)
[facility] EMERGENCY DEPARTMENT Provider Note   CSN: [id number] Arrival date & time: 07/15/19  1430     History Chief Complaint  Patient presents with  . Weakness    Juan [name] is a 79 y.o. male.  The history is provided by the patient and medical records. No language interpreter was used.  Weakness  Juan [name] is a 79 y.o. male who presents to the Emergency Department complaining of chest pain, weakness.  He complains of sharp, central chest pain that started last night.  He feels like his breathing is about to cut off.  He has some associated sob.  He has associated diaphoresis.  He vomited twice this morning (looked like his breakfast).  No current nausea.  He has associated LUQ pain.  Has associated generalized weakness.  He lives alone.  Denies tobacco, alcohol, drug use.  He takes his medications as directed.  No recent medication changes.  Denies fever, diarrhea, dysuria.      Past Medical History:  Diagnosis Date  . Aortic atherosclerosis ([place])   . Back pain   . Barrett esophagus   . Bleeding nose   . DDD (degenerative disc disease), thoracolumbar    Moderate to severe  . Diabetes mellitus without complication ([place])   . Diverticulosis   . Esophageal cancer ([place]) dx'd 2017  . GERD (gastroesophageal reflux disease)   . Headache   . Hearing loss    Right ear  . History of umbilical hernia repair   . Idiopathic peripheral neuropathy   . Iron deficiency anemia   . LBBB (left bundle branch block) 02/11/2016  . Left hip pain    Severe degenerative changes  . Lipoma of neck    Right  . Mixed hyperlipidemia   . PONV (postoperative nausea and vomiting)   . Reflux   . Right thyroid nodule     Patient Active Problem List   Diagnosis Date Noted  . Osteoarthritis of left hip 07/22/2017  . Iron deficiency anemia 05/17/2017  . LBBB (left bundle branch block) 02/11/2016  . Port catheter in place 12/25/2015  . Adjustment disorder with mixed  disturbance of emotions and conduct 12/20/2015  . Esophageal cancer ([place]) 11/22/2015  . Noise effect on both inner ears 08/29/2015  . Right thyroid nodule 08/29/2015    Past Surgical History:  Procedure Laterality Date  . BALLOON DILATION N/A 05/06/2018   Procedure: BALLOON DILATION;  Surgeon: Carol Ada, MD;  Location: [facility];  Service: Endoscopy;  Laterality: N/A;  . COLONOSCOPY    . ESOPHAGOGASTRODUODENOSCOPY (EGD) WITH PROPOFOL N/A 05/06/2018   Procedure: ESOPHAGOGASTRODUODENOSCOPY (EGD) WITH PROPOFOL;  Surgeon: Carol Ada, MD;  Location: [facility];  Service: Endoscopy;  Laterality: N/A;  . EUS N/A 12/05/2015   Procedure: UPPER ENDOSCOPIC ULTRASOUND (EUS) LINEAR;  Surgeon: Carol Ada, MD;  Location[facility];  Service: Endoscopy;  Laterality: N/A;  . EUS N/A 03/10/2016   Procedure: UPPER ENDOSCOPIC ULTRASOUND (EUS) LINEAR;  Surgeon: Carol Ada, MD;  [facility];  Service: Endoscopy;  Laterality: N/A;  . EUS N/A 01/07/2017   Procedure: UPPER ENDOSCOPIC ULTRASOUND (EUS) LINEAR;  Surgeon: Carol Ada, MD;  Location: [facility];  Service: Endoscopy;  Laterality: N/A;  . HERNIA REPAIR    . IR GENERIC HISTORICAL  12/24/2015   IR FLUORO GUIDE PORT INSERTION RIGHT 12/24/2015 Arne Cleveland, MD [facility]  . IR GENERIC HISTORICAL  12/24/2015   IR US GUIDE VASC ACCESS RIGHT 12/24/2015 Arne Cleveland, MD [facility]  .  IR REMOVAL TUN ACCESS W/ PORT W/O FL MOD SED  02/07/2018  . PORTA CATH INSERTION    . SHOULDER ARTHROSCOPY W/ SUPERIOR LABRAL ANTERIOR POSTERIOR LESION REPAIR     times 2  . TOTAL HIP ARTHROPLASTY Left 07/22/2017   Procedure: LEFT TOTAL HIP ARTHROPLASTY ANTERIOR APPROACH;  Surgeon: Rod Can, MD;  Location: [facility];  Service: Orthopedics;  Laterality: Left;  . UPPER GI ENDOSCOPY  01/07/2017       Family History  Problem Relation Age of Onset  . Colon cancer Father 51  . Colon cancer Brother 19  . Heart attack Brother   . Heart  disease Mother   . Heart attack Mother   . Diabetes Sister   . Stroke Sister     Social History   Tobacco Use  . Smoking status: Former Smoker    Packs/day: 2.00    Years: 40.00    Pack years: 80.00    Quit date: 03/31/1999    Years since quitting: 20.3  . Smokeless tobacco: Never Used  Substance Use Topics  . Alcohol use: Not Currently  . Drug use: No    Home Medications Prior to Admission medications   Medication Sig Start Date End Date Taking? Authorizing Provider  albuterol (VENTOLIN HFA) 108 (90 Base) MCG/ACT inhaler  06/14/19   [provider]  amitriptyline (ELAVIL) 10 MG tablet Take 10 mg by mouth at bedtime. 03/11/18   [provider]  aspirin EC 81 MG tablet Take 81 mg by mouth daily.    [provider]  celecoxib (CELEBREX) 200 MG capsule Take 200 mg by mouth daily. 04/17/19   [provider]  furosemide (LASIX) 20 MG tablet Take 1 tablet (20 mg total) by mouth daily. 07/02/18   [name], MD  gabapentin (NEURONTIN) 300 MG capsule Take 300 mg by mouth 2 (two) times daily.    [provider]  glimepiride (AMARYL) 1 MG tablet Take 1 mg by mouth daily after breakfast.  02/22/18   [provider]  LIPITOR 20 MG tablet Take 20 mg by mouth every evening. 02/21/18   [provider]  oxyCODONE-acetaminophen (PERCOCET) 10-325 MG tablet  01/11/19   [provider]  pantoprazole (PROTONIX) 40 MG tablet Take 40 mg by mouth daily. 02/21/18   [provider]  tiZANidine (ZANAFLEX) 2 MG tablet Take 2 mg by mouth 2 (two) times daily as needed for muscle spasms. 02/22/18   [provider]    Allergies    Patient has no known allergies.  Review of Systems   Review of Systems  Neurological: Positive for weakness.  All other systems reviewed and are negative.   Physical Exam Updated Vital Signs BP (!) 142/86   Pulse (!) 53   Temp 98.2 F (36.8 C) (Oral)   Resp 11   Ht 5\' 9"  (1.753  m)   Wt 92.1 kg   SpO2 100%   BMI 29.98 kg/m   Physical Exam Vitals and nursing note reviewed.  Constitutional:      Appearance: He is well-developed.  HENT:     Head: Normocephalic and atraumatic.  Cardiovascular:     Rate and Rhythm: Normal rate and regular rhythm.     Heart sounds: No murmur.  Pulmonary:     Effort: Pulmonary effort is normal. No respiratory distress.     Breath sounds: Normal breath sounds.  Abdominal:     Palpations: Abdomen is soft.     Tenderness: There is no  abdominal tenderness. There is no guarding or rebound.  Musculoskeletal:        General: No swelling or tenderness.     Comments: 2+ radial and DP pulses bilaterally  Skin:    General: Skin is warm and dry.  Neurological:     Mental Status: He is alert and oriented to person, place, and time.  Psychiatric:        Behavior: Behavior normal.     ED Results / Procedures / Treatments   Labs (all labs ordered are listed, but only abnormal results are displayed) Labs Reviewed  BASIC METABOLIC PANEL - Abnormal; Notable for the following components:      Result Value   Glucose, Bld 102 (*)    BUN 6 (*)    All other components within normal limits  CBC - Abnormal; Notable for the following components:   WBC 3.5 (*)    Hemoglobin 12.5 (*)    MCV 76.6 (*)    MCH 22.9 (*)    MCHC 29.8 (*)    All other components within normal limits  URINALYSIS, ROUTINE W REFLEX MICROSCOPIC - Abnormal; Notable for the following components:   Color, Urine STRAW (*)    All other components within normal limits  HEPATIC FUNCTION PANEL - Abnormal; Notable for the following components:   AST 55 (*)    Bilirubin, Direct 0.5 (*)    All other components within normal limits  TROPONIN I (HIGH SENSITIVITY)  TROPONIN I (HIGH SENSITIVITY)    EKG EKG Interpretation  Date/Time:  Saturday July 15 2019 14:46:12 EDT Ventricular Rate:  64 PR Interval:  188 QRS Duration: 146 QT Interval:  474 QTC Calculation: 489 R  Axis:   50 Text Interpretation: Normal sinus rhythm Left bundle branch block Abnormal ECG LBBB present on prior EKG 06/24/19 Confirmed by Quintella Reichert [phone]) on 07/15/2019 6:19:03 PM   Radiology No results found.  Procedures Procedures (including critical care time)  Medications Ordered in ED Medications  sodium chloride flush (NS) 0.9 % injection 3 mL (has no administration in time range)    ED Course  I have reviewed the triage vital signs and the nursing notes.  Pertinent labs & imaging results that were available during my care of the patient were reviewed by me and considered in my medical decision making (see chart for details).    MDM Rules/Calculators/A&P                     Patient with history of esophageal cancer here for evaluation of sharp, central chest pain, left upper quadrant pain with two episodes of emesis. He is non-toxic appearing on evaluation, with no reproducible tenderness on exam. EKG with left bundle branch block, unchanged when compared to priors. Plan to obtain CTA to rule out dissection, central PE, mass. Patient care transferred pending CTA.  Final Clinical Impression(s) / ED Diagnoses Final diagnoses:  None    Rx / DC Orders ED Discharge Orders    None       Quintella Reichert, MD 07/15/19 2053

## 2019-07-15 NOTE — ED Notes (Signed)
Report gotten from [place], pt only pending to get a CT angio of his chest, per report pt is not distress at this time, only complaining of generalized weakness for the past few days, pt moved to the hallway bed 21 per Cornell Barman, ED CN request.

## 2019-07-15 NOTE — ED Notes (Signed)
Attempted IV access x2, unable to obtain. Flash noted both times, able to obtain blood from 1st attempt but unable to advance. IV team consulted

## 2019-07-15 NOTE — ED Triage Notes (Signed)
C/o generalized weakness x 3-4 days.  Vomited x 2 this morning.  Reports lower back, L arm and L leg pain that he states may be related to arthritis.

## 2019-07-16 ENCOUNTER — Emergency Department (HOSPITAL_COMMUNITY): Payer: 59

## 2019-07-16 DIAGNOSIS — R0789 Other chest pain: Secondary | ICD-10-CM | POA: Diagnosis not present

## 2019-07-16 MED ORDER — OXYCODONE-ACETAMINOPHEN 5-325 MG PO TABS
1.0000 | ORAL_TABLET | Freq: Once | ORAL | Status: AC
  Administered 2019-07-16: 1 via ORAL
  Filled 2019-07-16: qty 1

## 2019-07-16 NOTE — ED Provider Notes (Signed)
I assumed care of this patient.  Please see previous provider note for further details of Hx, PE.  Briefly patient is a 79 y.o. male who presented sharp chest pain pending imaging to rule out dissection.  IV infiltrated and unable to obtain a CTA.  Sent for MRI of the chest ruling out dissection.  No lymphadenopathy.  No other acute findings.  The patient appears reasonably screened and/or stabilized for discharge and I doubt any other medical condition or other [facility] requiring further screening, evaluation, or treatment in the ED at this time prior to discharge. Safe for discharge with strict return precautions.  Disposition: Discharge  Condition: Good  I have discussed the results, Dx and Tx plan with the patient/family who expressed understanding and agree(s) with the plan. Discharge instructions discussed at length. The patient/family was given strict return precautions who verbalized understanding of the instructions. No further questions at time of discharge.    ED Discharge Orders    None       Follow Up: [place] [address] [id number] [place] Go to  If symptoms worsen  [place], [place] Merrilee Seashore), MD [address] [place] [address] [phone]  Schedule an appointment as soon as possible for a visit           Leonette Monarch Grayce Sessions, MD 07/16/19 [phone]

## 2019-07-16 NOTE — ED Provider Notes (Signed)
Clinical Course as of Jul 16 1142  Sat Jul 15, 2019  2109 Patient signed out to me by Dr. Ralene Bathe.  Briefly 79 yo male w/ esophageal cancer presenting for sharp chest pain, LUQ pain, pending CTA dissection study.  Trop at baseline and flat on repeat.  Patient unfortunately had infiltration of IV during original CTA, unable to obtain reliable imaging of vessels.  He had a 2nd IV placed by IV team, and per Dr Ralene Bathe his original EDP's request, will undergo repeat CTA.  If this is unremarkable I anticipate discharge home.  This plan was subsequently relayed to Dr Leonette Monarch who is the overnight physician taking over the case.   [MT]    Clinical Course User Index [MT] Jered Heiny, Carola Rhine, MD      Wyvonnia Dusky, MD 07/16/19 [phone]

## 2019-07-21 ENCOUNTER — Other Ambulatory Visit: Payer: Self-pay

## 2019-07-21 ENCOUNTER — Ambulatory Visit (HOSPITAL_COMMUNITY)
Admission: RE | Admit: 2019-07-21 | Discharge: 2019-07-21 | Disposition: A | Discharge status: Home / Self Care | Payer: 59 | Source: Ambulatory Visit | Attending: Hematology | Admitting: Hematology

## 2019-07-21 DIAGNOSIS — R221 Localized swelling, mass and lump, neck: Secondary | ICD-10-CM | POA: Diagnosis present

## 2019-07-22 ENCOUNTER — Other Ambulatory Visit: Payer: Self-pay

## 2019-07-22 ENCOUNTER — Emergency Department (HOSPITAL_COMMUNITY): Payer: 59

## 2019-07-22 DIAGNOSIS — Z7982 Long term (current) use of aspirin: Secondary | ICD-10-CM | POA: Diagnosis not present

## 2019-07-22 DIAGNOSIS — Z87891 Personal history of nicotine dependence: Secondary | ICD-10-CM | POA: Diagnosis not present

## 2019-07-22 DIAGNOSIS — Z7984 Long term (current) use of oral hypoglycemic drugs: Secondary | ICD-10-CM | POA: Diagnosis not present

## 2019-07-22 DIAGNOSIS — R0789 Other chest pain: Secondary | ICD-10-CM | POA: Insufficient documentation

## 2019-07-22 DIAGNOSIS — Z8501 Personal history of malignant neoplasm of esophagus: Secondary | ICD-10-CM | POA: Insufficient documentation

## 2019-07-22 DIAGNOSIS — Z79899 Other long term (current) drug therapy: Secondary | ICD-10-CM | POA: Insufficient documentation

## 2019-07-22 DIAGNOSIS — E119 Type 2 diabetes mellitus without complications: Secondary | ICD-10-CM | POA: Insufficient documentation

## 2019-07-22 DIAGNOSIS — I10 Essential (primary) hypertension: Secondary | ICD-10-CM | POA: Diagnosis not present

## 2019-07-22 DIAGNOSIS — Z96642 Presence of left artificial hip joint: Secondary | ICD-10-CM | POA: Diagnosis not present

## 2019-07-22 DIAGNOSIS — R609 Edema, unspecified: Secondary | ICD-10-CM | POA: Diagnosis not present

## 2019-07-22 DIAGNOSIS — I447 Left bundle-branch block, unspecified: Secondary | ICD-10-CM | POA: Diagnosis not present

## 2019-07-22 DIAGNOSIS — R079 Chest pain, unspecified: Secondary | ICD-10-CM | POA: Diagnosis not present

## 2019-07-22 LAB — CBC
HCT: 41.3 % (ref 39.0–52.0)
Hemoglobin: 12.7 g/dL — ABNORMAL LOW (ref 13.0–17.0)
MCH: 23.5 pg — ABNORMAL LOW (ref 26.0–34.0)
MCHC: 30.8 g/dL (ref 30.0–36.0)
MCV: 76.3 fL — ABNORMAL LOW (ref 80.0–100.0)
Platelets: 278 10*3/uL (ref 150–400)
RBC: 5.41 MIL/uL (ref 4.22–5.81)
RDW: 15.3 % (ref 11.5–15.5)
WBC: 4.9 10*3/uL (ref 4.0–10.5)
nRBC: 0 % (ref 0.0–0.2)

## 2019-07-22 LAB — BASIC METABOLIC PANEL
Anion gap: 9 (ref 5–15)
BUN: 10 mg/dL (ref 8–23)
CO2: 25 mmol/L (ref 22–32)
Calcium: 9.3 mg/dL (ref 8.9–10.3)
Chloride: 105 mmol/L (ref 98–111)
Creatinine, Ser: 1.15 mg/dL (ref 0.61–1.24)
GFR calc Af Amer: >60 mL/min (ref >60)
GFR calc non Af Amer: >60 mL/min (ref >60)
Glucose, Bld: 118 mg/dL — ABNORMAL HIGH (ref 70–99)
Potassium: 4.5 mmol/L (ref 3.5–5.1)
Sodium: 139 mmol/L (ref 135–145)

## 2019-07-22 LAB — TROPONIN I (HIGH SENSITIVITY): Troponin I (High Sensitivity): 9 ng/L (ref <18)

## 2019-07-22 NOTE — ED Triage Notes (Signed)
Arrives C/C chest pain and soreness, intermittent for the past few months. This episode started last Friday and has been getting worse. Endorses SOB, pain from L chest shooting down to his L leg. Endorses N/V/D.

## 2019-07-23 ENCOUNTER — Other Ambulatory Visit: Payer: Self-pay

## 2019-07-23 ENCOUNTER — Emergency Department (HOSPITAL_COMMUNITY)
Admission: EM | Admit: 2019-07-23 | Discharge: 2019-07-23 | Disposition: A | Discharge status: Home / Self Care | Payer: 59 | Attending: Emergency Medicine | Admitting: Emergency Medicine

## 2019-07-23 DIAGNOSIS — R0789 Other chest pain: Secondary | ICD-10-CM

## 2019-07-23 LAB — TROPONIN I (HIGH SENSITIVITY): Troponin I (High Sensitivity): 12 ng/L (ref <18)

## 2019-07-23 NOTE — ED Provider Notes (Signed)
[facility] DEPT Provider Note: Juan Spurling, MD, FACEP  CSN: [id number] MRN: [id number] ARRIVAL: 07/22/19 at 2206 ROOM: [facility]  Chest Pain   HISTORY OF PRESENT ILLNESS  07/23/19 3:11 AM Juan [name] is a 79 y.o. male who complains of pain in his left chest that he describes as a soreness or aching but rates it as a 9 out of 10.  It radiates to his left arm and down to his left great toe.  The pain is worse with movement of his neck.  He is also having some pain on both sides of his neck which is exacerbated with certain sleeping positions.  He has some shortness of breath but this is not changed from baseline.  The pain began 2 mornings ago.  He has also had some nausea, vomiting and diarrhea recently.   Past Medical History:  Diagnosis Date  . Aortic atherosclerosis ([place])   . Back pain   . Barrett esophagus   . Bleeding nose   . DDD (degenerative disc disease), thoracolumbar    Moderate to severe  . Diabetes mellitus without complication ([place])   . Diverticulosis   . Esophageal cancer ([place]) dx'd 2017  . GERD (gastroesophageal reflux disease)   . Headache   . Hearing loss    Right ear  . History of umbilical hernia repair   . Idiopathic peripheral neuropathy   . Iron deficiency anemia   . LBBB (left bundle branch block) 02/11/2016  . Left hip pain    Severe degenerative changes  . Lipoma of neck    Right  . Mixed hyperlipidemia   . PONV (postoperative nausea and vomiting)   . Reflux   . Right thyroid nodule     Past Surgical History:  Procedure Laterality Date  . BALLOON DILATION N/A 05/06/2018   Procedure: BALLOON DILATION;  Surgeon: Carol Ada, MD;  Location: [facility];  Service: Endoscopy;  Laterality: N/A;  . COLONOSCOPY    . ESOPHAGOGASTRODUODENOSCOPY (EGD) WITH PROPOFOL N/A 05/06/2018   Procedure: ESOPHAGOGASTRODUODENOSCOPY (EGD) WITH PROPOFOL;  Surgeon: Carol Ada, MD;  [facility];  Service: Endoscopy;  Laterality:  N/A;  . EUS N/A 12/05/2015   Procedure: UPPER ENDOSCOPIC ULTRASOUND (EUS) LINEAR;  Surgeon: Carol Ada, MD;  [facility];  Service: Endoscopy;  Laterality: N/A;  . EUS N/A 03/10/2016   Procedure: UPPER ENDOSCOPIC ULTRASOUND (EUS) LINEAR;  Surgeon: Carol Ada, MD;  [facility];  Service: Endoscopy;  Laterality: N/A;  . EUS N/A 01/07/2017   Procedure: UPPER ENDOSCOPIC ULTRASOUND (EUS) LINEAR;  Surgeon: Carol Ada, MD;  Location: [facility];  Service: Endoscopy;  Laterality: N/A;  . HERNIA REPAIR    . IR GENERIC HISTORICAL  12/24/2015   IR FLUORO GUIDE PORT INSERTION RIGHT 12/24/2015 Arne Cleveland, MD [facility]  . IR GENERIC HISTORICAL  12/24/2015   IR US GUIDE VASC ACCESS RIGHT 12/24/2015 Arne Cleveland, MD [facility]  . IR REMOVAL TUN ACCESS W/ PORT W/O FL MOD SED  02/07/2018  . PORTA CATH INSERTION    . SHOULDER ARTHROSCOPY W/ SUPERIOR LABRAL ANTERIOR POSTERIOR LESION REPAIR     times 2  . TOTAL HIP ARTHROPLASTY Left 07/22/2017   Procedure: LEFT TOTAL HIP ARTHROPLASTY ANTERIOR APPROACH;  Surgeon: Rod Can, MD;  [facility];  Service: Orthopedics;  Laterality: Left;  . UPPER GI ENDOSCOPY  01/07/2017    Family History  Problem Relation Age of Onset  . Colon cancer Father 35  . Colon cancer Brother  40  . Heart attack Brother   . Heart disease Mother   . Heart attack Mother   . Diabetes Sister   . Stroke Sister     Social History   Tobacco Use  . Smoking status: Former Smoker    Packs/day: 2.00    Years: 40.00    Pack years: 80.00    Quit date: 03/31/1999    Years since quitting: 20.3  . Smokeless tobacco: Never Used  Substance Use Topics  . Alcohol use: Not Currently  . Drug use: No    Prior to Admission medications   Medication Sig Start Date End Date Taking? Authorizing Provider  albuterol (VENTOLIN HFA) 108 (90 Base) MCG/ACT inhaler Inhale 1-2 puffs into the lungs every 6 (six) hours as needed for wheezing or shortness of  breath.  06/14/19   [provider]  amitriptyline (ELAVIL) 10 MG tablet Take 10 mg by mouth at bedtime. 03/11/18   [provider]  aspirin EC 81 MG tablet Take 81 mg by mouth daily.    [provider]  B Complex Vitamins (B COMPLEX 1 PO) Take 1 tablet by mouth daily.    [provider]  celecoxib (CELEBREX) 200 MG capsule Take 200 mg by mouth daily. 04/17/19   [provider]  gabapentin (NEURONTIN) 300 MG capsule Take 300 mg by mouth 2 (two) times daily.    [provider]  glimepiride (AMARYL) 1 MG tablet Take 1 mg by mouth daily after breakfast.  02/22/18   [provider]  LIPITOR 20 MG tablet Take 20 mg by mouth every evening. 02/21/18   [provider]  Multiple Vitamin (MULTIVITAMIN WITH MINERALS) TABS tablet Take 1 tablet by mouth daily.    [provider]  pantoprazole (PROTONIX) 40 MG tablet Take 40 mg by mouth daily. 02/21/18   [provider]  furosemide (LASIX) 20 MG tablet Take 1 tablet (20 mg total) by mouth daily. Patient not taking: Reported on 07/16/2019 07/02/18 07/23/19  [name], MD    Allergies Patient has no known allergies.   REVIEW OF SYSTEMS  Negative except as noted here or in the History of Present Illness.   PHYSICAL EXAMINATION  Initial Vital Signs Blood pressure (!) 155/100, pulse (!) 59, temperature 98.3 F (36.8 C), temperature source Oral, resp. rate 18, height 5\' 9"  (1.753 m), weight 92.1 kg, SpO2 96 %.  Examination General: Well-developed, well-nourished male in no acute distress; appearance consistent with age of record HENT: normocephalic; atraumatic Eyes: pupils equal, round and reactive to light; extraocular muscles intact Neck: supple Heart: regular rate and rhythm; no murmurs, rubs or gallops Lungs: clear to auscultation bilaterally Abdomen: soft; nondistended; nontender; no masses or hepatosplenomegaly; bowel sounds present Extremities: No  deformity; full range of motion; pulses normal Neurologic: Awake, alert and oriented; motor function intact in all extremities and symmetric; no facial droop Skin: Warm and dry Psychiatric: Normal mood and affect   RESULTS  Summary of this visit's results, reviewed and interpreted by myself:   EKG Interpretation  Date/Time:  Saturday July 22 2019 22:25:16 EDT Ventricular Rate:  72 PR Interval:    QRS Duration: 152 QT Interval:  436 QTC Calculation: [id number] Axis:   31 Text Interpretation: Sinus arrhythmia Probable left atrial enlargement Left bundle branch block No significant change was found Confirmed by Macallan Ord [phone]) on 07/23/2019 3:12:13 AM      Laboratory Studies: Results for orders placed or performed during the hospital encounter of  07/23/19 (from the past 24 hour(s))  Basic metabolic panel     Status: Abnormal   Collection Time: 07/22/19 10:23 PM  Result Value Ref Range   Sodium 139 135 - 145 mmol/L   Potassium 4.5 3.5 - 5.1 mmol/L   Chloride 105 98 - 111 mmol/L   CO2 25 22 - 32 mmol/L   Glucose, Bld 118 (H) 70 - 99 mg/dL   BUN 10 8 - 23 mg/dL   Creatinine, Ser 1.15 0.61 - 1.24 mg/dL   Calcium 9.3 8.9 - 10.3 mg/dL   GFR calc non Af Amer >60 >60 mL/min   GFR calc Af Amer >60 >60 mL/min   Anion gap 9 5 - 15  CBC     Status: Abnormal   Collection Time: 07/22/19 10:23 PM  Result Value Ref Range   WBC 4.9 4.0 - 10.5 K/uL   RBC 5.41 4.22 - 5.81 MIL/uL   Hemoglobin 12.7 (L) 13.0 - 17.0 g/dL   HCT 41.3 39.0 - 52.0 %   MCV 76.3 (L) 80.0 - 100.0 fL   MCH 23.5 (L) 26.0 - 34.0 pg   MCHC 30.8 30.0 - 36.0 g/dL   RDW 15.3 11.5 - 15.5 %   Platelets 278 150 - 400 K/uL   nRBC 0.0 0.0 - 0.2 %  Troponin I (High Sensitivity)     Status: None   Collection Time: 07/22/19 10:23 PM  Result Value Ref Range   Troponin I (High Sensitivity) 9 <18 ng/L  Troponin I (High Sensitivity)     Status: None   Collection Time: 07/23/19  2:24 AM  Result Value Ref Range   Troponin I  (High Sensitivity) 12 <18 ng/L   Imaging Studies: DG Chest 2 View  Result Date: 07/22/2019 CLINICAL DATA:  Chest pain. EXAM: CHEST - 2 VIEW COMPARISON:  June 24, 2019 FINDINGS: There is no evidence of acute infiltrate. A stable subcentimeter calcified nodular opacity is seen overlying the lateral aspect of the left apex. There is no evidence of a pleural effusion or pneumothorax. The heart size and mediastinal contours are within normal limits. Degenerative changes are seen within the mid and lower thoracic spine. IMPRESSION: No active cardiopulmonary disease. Electronically Signed   By: Virgina Norfolk M.D.   On: 07/22/2019 22:43   US Soft Tissue Head/Neck  Result Date: 07/21/2019 CLINICAL DATA:  Mass right neck.  Question thyroid nodule. EXAM: ULTRASOUND OF HEAD/NECK SOFT TISSUES TECHNIQUE: Ultrasound examination of the head and neck soft tissues was performed in the area of clinical concern. COMPARISON:  CT neck 01/14/2018 FINDINGS: Ultrasound redemonstrates the lipoma the right neck measuring 6.5 cm in length with a transverse diameter of 3.9 cm. This has enlarged slightly since the CT scan of October 2019 when it measured 6 cm in length with a transverse diameter of 3.2 x 2.3 cm. The lesion is along the medial margin of the right sternocleidomastoid muscle and abuts the posterior aspect of the right submandibular gland. No other regional abnormal finding. This does not represent a thyroid nodule. No evidence of regional adenopathy. IMPRESSION: Lipoma of the right neck as shown on a previous CT October 2019. Slight enlargement since that time. Electronically Signed   By: Nelson Chimes M.D.   On: 07/21/2019 20:43    ED COURSE and MDM  Nursing notes, initial and subsequent vitals signs, including pulse oximetry, reviewed and interpreted by myself.  Vitals:   07/22/19 2227 07/22/19 2228 07/23/19 0203 07/23/19 0300  BP: (!) 196/99  Marland Kitchen)  155/100 (!) 120/110  Pulse: 68  (!) 59 (!) 53  Resp: 18  18  17   Temp: 97.9 F (36.6 C)  98.3 F (36.8 C)   TempSrc: Oral  Oral   SpO2: 100%  96% 98%  Weight:  92.1 kg 92.1 kg   Height:  5\' 9"  (1.753 m) 5\' 9"  (1.753 m)    Medications - No data to display  The patient's pain is atypical for cardiac etiology as it radiates to both his left leg and left arm and is exacerbated by movement of the neck.  I suspect cervical spinal etiology is the more likely cause.  His troponins are within normal limits and his EKG is not significantly changed from previous EKGs.  He tells me his pain is not severe at the present time.  PROCEDURES  Procedures   ED DIAGNOSES     ICD-10-CM   1. Atypical chest pain  R07.89        Shanon Rosser, MD 07/23/19 [phone]

## 2019-08-10 ENCOUNTER — Telehealth: Payer: Self-pay

## 2019-08-10 NOTE — Telephone Encounter (Signed)
Left vm with u/s results.

## 2019-08-15 ENCOUNTER — Telehealth: Payer: Self-pay

## 2019-08-15 NOTE — Telephone Encounter (Signed)
Mr Juan [name] called requesting results of u/s of his neck done in April 2021.  I reviewed results with him.  He verbalized understanding.

## 2019-09-27 DIAGNOSIS — F1190 Opioid use, unspecified, uncomplicated: Secondary | ICD-10-CM | POA: Diagnosis not present

## 2019-09-27 DIAGNOSIS — E1165 Type 2 diabetes mellitus with hyperglycemia: Secondary | ICD-10-CM | POA: Diagnosis not present

## 2019-09-27 DIAGNOSIS — E782 Mixed hyperlipidemia: Secondary | ICD-10-CM | POA: Diagnosis not present

## 2019-09-27 DIAGNOSIS — G609 Hereditary and idiopathic neuropathy, unspecified: Secondary | ICD-10-CM | POA: Diagnosis not present

## 2019-11-25 ENCOUNTER — Other Ambulatory Visit: Payer: Self-pay

## 2019-11-25 ENCOUNTER — Ambulatory Visit
Admission: EM | Admit: 2019-11-25 | Discharge: 2019-11-25 | Disposition: A | Discharge status: Home / Self Care | Payer: 59 | Attending: Emergency Medicine | Admitting: Emergency Medicine

## 2019-11-25 ENCOUNTER — Encounter: Payer: Self-pay | Admitting: Emergency Medicine

## 2019-11-25 DIAGNOSIS — R059 Cough, unspecified: Secondary | ICD-10-CM

## 2019-11-25 DIAGNOSIS — R05 Cough: Secondary | ICD-10-CM | POA: Diagnosis not present

## 2019-11-25 MED ORDER — FLUTICASONE PROPIONATE 50 MCG/ACT NA SUSP: 1.0000 | Freq: Every day | NASAL | 0 refills | Status: AC

## 2019-11-25 MED ORDER — BENZONATATE 100 MG PO CAPS
100.0000 mg | ORAL_CAPSULE | Freq: Three times a day (TID) | ORAL | 0 refills | Status: DC
Start: 2019-11-25 — End: 2019-12-28

## 2019-11-25 MED ORDER — CETIRIZINE HCL 10 MG PO TABS: 10.0000 mg | ORAL_TABLET | Freq: Every day | ORAL | 0 refills | Status: AC

## 2019-11-25 NOTE — ED Provider Notes (Signed)
[facility]    CSN: [id number] Arrival date & time: 11/25/19  1234      History   Chief Complaint Chief Complaint  Patient presents with  . Cough    HPI Juan [name] is a 79 y.o. male   Presenting for Covid testing.  Endorsing weeklong course of mildly productive cough without difficulty breathing, chest pain, palpitations, fever, lower leg swelling, nausea, vomiting.  States that he believes her been multiple people at work who have been Covid positive over the last 2 weeks-requesting testing today.  Managing sx with OTC medications.  Past Medical History:  Diagnosis Date  . Aortic atherosclerosis ([place])   . Back pain   . Barrett esophagus   . Bleeding nose   . DDD (degenerative disc disease), thoracolumbar    Moderate to severe  . Diabetes mellitus without complication ([place])   . Diverticulosis   . Esophageal cancer ([place]) dx'd 2017  . GERD (gastroesophageal reflux disease)   . Headache   . Hearing loss    Right ear  . History of umbilical hernia repair   . Idiopathic peripheral neuropathy   . Iron deficiency anemia   . LBBB (left bundle branch block) 02/11/2016  . Left hip pain    Severe degenerative changes  . Lipoma of neck    Right  . Mixed hyperlipidemia   . PONV (postoperative nausea and vomiting)   . Reflux   . Right thyroid nodule     Patient Active Problem List   Diagnosis Date Noted  . Osteoarthritis of left hip 07/22/2017  . Iron deficiency anemia 05/17/2017  . LBBB (left bundle branch block) 02/11/2016  . Port catheter in place 12/25/2015  . 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 Esophageal cancer ([place]) 11/22/2015  . Noise effect on both inner ears 08/29/2015  . Right thyroid nodule 08/29/2015    Past Surgical History:  Procedure Laterality Date  . BALLOON DILATION N/A 05/06/2018   Procedure: BALLOON DILATION;  Surgeon: Carol Ada, MD;  Location: [facility];  Service: Endoscopy;   Laterality: N/A;  . COLONOSCOPY    . ESOPHAGOGASTRODUODENOSCOPY (EGD) WITH PROPOFOL N/A 05/06/2018   Procedure: ESOPHAGOGASTRODUODENOSCOPY (EGD) WITH PROPOFOL;  Surgeon: Carol Ada, MD;  Location: [facility];  Service: Endoscopy;  Laterality: N/A;  . EUS N/A 12/05/2015   Procedure: UPPER ENDOSCOPIC ULTRASOUND (EUS) LINEAR;  Surgeon: Carol Ada, MD;  Location: [facility];  Service: Endoscopy;  Laterality: N/A;  . EUS N/A 03/10/2016   Procedure: UPPER ENDOSCOPIC ULTRASOUND (EUS) LINEAR;  Surgeon: Carol Ada, MD;  [facility];  Service: Endoscopy;  Laterality: N/A;  . EUS N/A 01/07/2017   Procedure: UPPER ENDOSCOPIC ULTRASOUND (EUS) LINEAR;  Surgeon: Carol Ada, MD;  Location: [place];  Service: Endoscopy;  Laterality: N/A;  . HERNIA REPAIR    . IR GENERIC HISTORICAL  12/24/2015   IR FLUORO GUIDE PORT INSERTION RIGHT 12/24/2015 Arne Cleveland, MD [facility]  . IR GENERIC HISTORICAL  12/24/2015   IR US GUIDE VASC ACCESS RIGHT 12/24/2015 Arne Cleveland, MD [facility]  . IR REMOVAL TUN ACCESS W/ PORT W/O FL MOD SED  02/07/2018  . PORTA CATH INSERTION    . SHOULDER ARTHROSCOPY W/ SUPERIOR LABRAL ANTERIOR POSTERIOR LESION REPAIR     times 2  . TOTAL HIP ARTHROPLASTY Left 07/22/2017   Procedure: LEFT TOTAL HIP ARTHROPLASTY ANTERIOR APPROACH;  Surgeon: Rod Can, MD;  Location: [facility];  Service: Orthopedics;  Laterality: Left;  . UPPER  GI ENDOSCOPY  01/07/2017       Home Medications    Prior to Admission medications   Medication Sig Start Date End Date Taking? Authorizing Provider  albuterol (VENTOLIN HFA) 108 (90 Base) MCG/ACT inhaler Inhale 1-2 puffs into the lungs every 6 (six) hours as needed for wheezing or shortness of breath.  06/14/19   [provider]  amitriptyline (ELAVIL) 10 MG tablet Take 10 mg by mouth at bedtime. 03/11/18   [provider]  aspirin EC 81 MG tablet Take 81 mg by mouth daily.    [provider]  B Complex  Vitamins (B COMPLEX 1 PO) Take 1 tablet by mouth daily.    [provider]  benzonatate (TESSALON) 100 MG capsule Take 1 capsule (100 mg total) by mouth every 8 (eight) hours. 11/25/19   Hall-Potvin, [place], PA-C  celecoxib (CELEBREX) 200 MG capsule Take 200 mg by mouth daily. 04/17/19   [provider]  cetirizine (ZYRTEC ALLERGY) 10 MG tablet Take 1 tablet (10 mg total) by mouth daily. 11/25/19   Hall-Potvin, [place], PA-C  fluticasone (FLONASE) 50 MCG/ACT nasal spray Place 1 spray into both nostrils daily. 11/25/19   Hall-Potvin, [place], PA-C  gabapentin (NEURONTIN) 300 MG capsule Take 300 mg by mouth 2 (two) times daily.    [provider]  glimepiride (AMARYL) 1 MG tablet Take 1 mg by mouth daily after breakfast.  02/22/18   [provider]  LIPITOR 20 MG tablet Take 20 mg by mouth every evening. 02/21/18   [provider]  Multiple Vitamin (MULTIVITAMIN WITH MINERALS) TABS tablet Take 1 tablet by mouth daily.    [provider]  pantoprazole (PROTONIX) 40 MG tablet Take 40 mg by mouth daily. 02/21/18   [provider]  furosemide (LASIX) 20 MG tablet Take 1 tablet (20 mg total) by mouth daily. Patient not taking: Reported on 07/16/2019 07/02/18 07/23/19  [name], MD    Family History Family History  Problem Relation Age of Onset  . Colon cancer Father 48  . Colon cancer Brother 1  . Heart attack Brother   . Heart disease Mother   . Heart attack Mother   . Diabetes Sister   . Stroke Sister     Social History Social History   Tobacco Use  . Smoking status: Former Smoker    Packs/day: 2.00    Years: 40.00    Pack years: 80.00    Quit date: 03/31/1999    Years since quitting: 20.6  . Smokeless tobacco: Never Used  Vaping Use  . Vaping Use: Never used  Substance Use Topics  . Alcohol use: Not Currently  . Drug use: No     Allergies   Patient has no known allergies.   Review of Systems As per  HPI   Physical Exam Triage Vital Signs ED Triage Vitals [11/25/19 1332]  Enc Vitals Group     BP (!) 161/86     Pulse Rate 60     Resp 18     Temp (!) 97.4 F (36.3 C)     Temp Source Oral     SpO2 95 %     Weight      Height      Head Circumference      Peak Flow      Pain Score 0     Pain Loc      Pain Edu?      Excl. in [place]?    No  data found.  Updated Vital Signs BP (!) 161/86 (BP Location: Left Arm)   Pulse 60   Temp (!) 97.4 F (36.3 C) (Oral)   Resp 18   SpO2 95%   Visual Acuity Right Eye Distance:   Left Eye Distance:   Bilateral Distance:    Right Eye Near:   Left Eye Near:    Bilateral Near:     Physical Exam Constitutional:      General: He is not in acute distress.    Appearance: He is not toxic-appearing or diaphoretic.  HENT:     Head: Normocephalic and atraumatic.     Mouth/Throat:     Mouth: Mucous membranes are moist.     Pharynx: Oropharynx is clear.  Eyes:     General: No scleral icterus.    Conjunctiva/sclera: Conjunctivae normal.     Pupils: Pupils are equal, round, and reactive to light.  Neck:     Comments: Trachea midline, negative JVD Cardiovascular:     Rate and Rhythm: Normal rate and regular rhythm.  Pulmonary:     Effort: Pulmonary effort is normal. No respiratory distress.     Breath sounds: No wheezing or rales.  Musculoskeletal:     Cervical back: Neck supple. No tenderness.  Lymphadenopathy:     Cervical: No cervical adenopathy.  Skin:    Capillary Refill: Capillary refill takes less than 2 seconds.     Coloration: Skin is not jaundiced or pale.     Findings: No rash.  Neurological:     Mental Status: He is alert and oriented to person, place, and time.      UC Treatments / Results  Labs (all labs ordered are listed, but only abnormal results are displayed) Labs Reviewed  NOVEL CORONAVIRUS, NAA    EKG   Radiology No results found.  Procedures Procedures (including critical care  time)  Medications Ordered in UC Medications - No data to display  Initial Impression / Assessment and Plan / UC Course  I have reviewed the triage vital signs and the nursing notes.  Pertinent labs & imaging results that were available during my care of the patient were reviewed by me and considered in my medical decision making (see chart for details).     Patient afebrile, nontoxic, with SpO2 95%.  Covid PCR pending.  Patient to quarantine until results are back.  We will treat supportively as outlined below.  Return precautions discussed, patient verbalized understanding and is agreeable to plan. Final Clinical Impressions(s) / UC Diagnoses   Final diagnoses:  None     Discharge Instructions     Your COVID test is pending - it is important to quarantine / isolate at home until your results are back. If you test positive and would like further evaluation for persistent or worsening symptoms, you may schedule an E-visit or virtual (video) visit throughout the [facility] app or website.  PLEASE NOTE: If you develop severe chest pain or shortness of breath please go to the ER or call [phone] for further evaluation --> DO NOT schedule electronic or virtual visits for this. Please call our office for further guidance / recommendations as needed.  For information about the Covid vaccine, please visit [email or web address]    ED Prescriptions    Medication Sig Dispense Auth. Provider   benzonatate (TESSALON) 100 MG capsule Take 1 capsule (100 mg total) by mouth every 8 (eight) hours. 21 capsule Hall-Potvin, [place], PA-C   cetirizine (ZYRTEC ALLERGY) 10 MG tablet  Take 1 tablet (10 mg total) by mouth daily. 30 tablet Hall-Potvin, [place], PA-C   fluticasone (FLONASE) 50 MCG/ACT nasal spray Place 1 spray into both nostrils daily. 16 g Hall-Potvin, [place], PA-C     PDMP not reviewed this encounter.   Hall-Potvin, [place], [place] 11/25/19 1439

## 2019-11-25 NOTE — ED Triage Notes (Signed)
Pt here for cough x 2 weeks and requests covid test

## 2019-11-25 NOTE — Discharge Instructions (Addendum)
Your COVID test is pending - it is important to quarantine / isolate at home until your results are back. °If you test positive and would like further evaluation for persistent or worsening symptoms, you may schedule an E-visit or virtual (video) visit throughout the [facility] app or website. ° °PLEASE NOTE: If you develop severe chest pain or shortness of breath please go to the ER or call 9-1-1 for further evaluation --> DO NOT schedule electronic or virtual visits for this. °Please call our office for further guidance / recommendations as needed. ° °For information about the Covid vaccine, please visit [email or web address] °

## 2019-11-26 LAB — NOVEL CORONAVIRUS, NAA: SARS-CoV-2, NAA: NOT DETECTED

## 2019-11-26 LAB — SARS-COV-2, NAA 2 DAY TAT

## 2019-12-01 ENCOUNTER — Other Ambulatory Visit: Payer: Self-pay

## 2019-12-28 ENCOUNTER — Ambulatory Visit
Admission: EM | Admit: 2019-12-28 | Discharge: 2019-12-28 | Disposition: A | Discharge status: Home / Self Care | Payer: 59 | Attending: Emergency Medicine | Admitting: Emergency Medicine

## 2019-12-28 ENCOUNTER — Other Ambulatory Visit: Payer: Self-pay

## 2019-12-28 DIAGNOSIS — Z1152 Encounter for screening for COVID-19: Secondary | ICD-10-CM

## 2019-12-28 DIAGNOSIS — R52 Pain, unspecified: Secondary | ICD-10-CM | POA: Diagnosis not present

## 2019-12-28 DIAGNOSIS — R062 Wheezing: Secondary | ICD-10-CM

## 2019-12-28 DIAGNOSIS — R0981 Nasal congestion: Secondary | ICD-10-CM

## 2019-12-28 DIAGNOSIS — R05 Cough: Secondary | ICD-10-CM

## 2019-12-28 DIAGNOSIS — R059 Cough, unspecified: Secondary | ICD-10-CM

## 2019-12-28 MED ORDER — BENZONATATE 100 MG PO CAPS: 100.0000 mg | ORAL_CAPSULE | Freq: Three times a day (TID) | ORAL | 0 refills | Status: DC | PRN

## 2019-12-28 MED ORDER — FLUTICASONE-SALMETEROL 250-50 MCG/DOSE IN AEPB
1.0000 | INHALATION_SPRAY | Freq: Two times a day (BID) | RESPIRATORY_TRACT | 0 refills | Status: DC
Start: 2019-12-28 — End: 2020-01-13

## 2019-12-28 NOTE — ED Provider Notes (Signed)
[facility]    CSN: [id number] Arrival date & time: 12/28/19  1213      History   Chief Complaint Chief Complaint  Patient presents with   Cough    HPI Juan [name] is a 79 y.o. male.   79 year old male presents with sore throat, nasal congestion, cough, fatigue and weakness for about 1 week. Also having headache, chills, body aches, and decrease in taste and smell. Recently had low grade fever (99). Also having some loose stools/diarrhea and shortness of breath. He has used his Albuterol inhaler with some relief. Also has taken Nyquil with honey with some relief. No definite exposure to COVID 19 but many co-workers do not wear masks at work and some co-workers have been sick. He is fully vaccinated against COVID 19 but will get a booster in October. Other chronic health issues include HTN, CAD, hyperlipidemia, type 2 DM, GERD, degenerative disc disease, Diverticulosis, peripheral neuropathy, hx of esophageal cancer, iron deficiency anemia, probable COPD, and hearing impairment. Currently on Lipitor, aspirin, Glimepiride, Protonix, Amitriptyline, Celebrex, Neurontin, Zyrtec and Flonase daily and Albuterol prn. Was on Lasix for HTN and cardiac issues but no longer taking. He was seen here about 1 month ago for cough and probable viral respiratory illness which resolved.   The history is provided by the patient.    Past Medical History:  Diagnosis Date   Aortic atherosclerosis ([facility])    Back pain    Barrett esophagus    Bleeding nose    DDD (degenerative disc disease), thoracolumbar    Moderate to severe   Diabetes mellitus without complication ([place])    Diverticulosis    Esophageal cancer ([place]) dx'd 2017   GERD (gastroesophageal reflux disease)    Headache    Hearing loss    Right ear   History of umbilical hernia repair    Idiopathic peripheral neuropathy    Iron deficiency anemia    LBBB (left bundle branch block) 02/11/2016   Left hip pain     Severe degenerative changes   Lipoma of neck    Right   Mixed hyperlipidemia    PONV (postoperative nausea and vomiting)    Reflux    Right thyroid nodule     Patient Active Problem List   Diagnosis Date Noted   Osteoarthritis of left hip 07/22/2017   Iron deficiency anemia 05/17/2017   LBBB (left bundle branch block) 02/11/2016   Port catheter in place 12/25/2015   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   Esophageal cancer ([place]) 11/22/2015   Noise effect on both inner ears 08/29/2015   Right thyroid nodule 08/29/2015    Past Surgical History:  Procedure Laterality Date   BALLOON DILATION N/A 05/06/2018   Procedure: BALLOON DILATION;  Surgeon: Carol Ada, MD;  [facility];  Service: Endoscopy;  Laterality: N/A;   COLONOSCOPY     ESOPHAGOGASTRODUODENOSCOPY (EGD) WITH PROPOFOL N/A 05/06/2018   Procedure: ESOPHAGOGASTRODUODENOSCOPY (EGD) WITH PROPOFOL;  Surgeon: Carol Ada, MD;  [facility];  Service: Endoscopy;  Laterality: N/A;   EUS N/A 12/05/2015   Procedure: UPPER ENDOSCOPIC ULTRASOUND (EUS) LINEAR;  Surgeon: Carol Ada, MD;  [facility];  Service: Endoscopy;  Laterality: N/A;   EUS N/A 03/10/2016   Procedure: UPPER ENDOSCOPIC ULTRASOUND (EUS) LINEAR;  Surgeon: Carol Ada, MD;  [facility];  Service: Endoscopy;  Laterality: N/A;   EUS N/A 01/07/2017   Procedure: UPPER ENDOSCOPIC ULTRASOUND (EUS) LINEAR;  Surgeon: Carol Ada,  MD;  Location: [facility];  Service: Endoscopy;  Laterality: N/A;   HERNIA REPAIR     IR GENERIC HISTORICAL  12/24/2015   IR FLUORO GUIDE PORT INSERTION RIGHT 12/24/2015 Arne Cleveland, MD [facility]   IR GENERIC HISTORICAL  12/24/2015   IR US GUIDE VASC ACCESS RIGHT 12/24/2015 Arne Cleveland, MD [facility]   IR REMOVAL TUN ACCESS W/ PORT W/O FL MOD SED  02/07/2018   PORTA CATH INSERTION     SHOULDER ARTHROSCOPY W/ SUPERIOR LABRAL  ANTERIOR POSTERIOR LESION REPAIR     times 2   TOTAL HIP ARTHROPLASTY Left 07/22/2017   Procedure: LEFT TOTAL HIP ARTHROPLASTY ANTERIOR APPROACH;  Surgeon: Rod Can, MD;  Location: [facility];  Service: Orthopedics;  Laterality: Left;   UPPER GI ENDOSCOPY  01/07/2017       Home Medications    Prior to Admission medications   Medication Sig Start Date End Date Taking? Authorizing Provider  albuterol (VENTOLIN HFA) 108 (90 Base) MCG/ACT inhaler Inhale 1-2 puffs into the lungs every 6 (six) hours as needed for wheezing or shortness of breath.  06/14/19   [provider]  amitriptyline (ELAVIL) 10 MG tablet Take 10 mg by mouth at bedtime. 03/11/18   [provider]  aspirin EC 81 MG tablet Take 81 mg by mouth daily.    [provider]  B Complex Vitamins (B COMPLEX 1 PO) Take 1 tablet by mouth daily.    [provider]  benzonatate (TESSALON) 100 MG capsule Take 1 capsule (100 mg total) by mouth 3 (three) times daily as needed for cough. 12/28/19   Katy Apo, NP  celecoxib (CELEBREX) 200 MG capsule Take 200 mg by mouth daily. 04/17/19   [provider]  cetirizine (ZYRTEC ALLERGY) 10 MG tablet Take 1 tablet (10 mg total) by mouth daily. 11/25/19   Hall-Potvin, [place], PA-C  fluticasone (FLONASE) 50 MCG/ACT nasal spray Place 1 spray into both nostrils daily. 11/25/19   Hall-Potvin, [place], PA-C  Fluticasone-Salmeterol (ADVAIR DISKUS) 250-50 MCG/DOSE AEPB Inhale 1 puff into the lungs in the morning and at bedtime. 12/28/19   Katy Apo, NP  gabapentin (NEURONTIN) 300 MG capsule Take 300 mg by mouth 2 (two) times daily.    [provider]  glimepiride (AMARYL) 1 MG tablet Take 1 mg by mouth daily after breakfast.  02/22/18   [provider]  LIPITOR 20 MG tablet Take 20 mg by mouth every evening. 02/21/18   [provider]  Multiple Vitamin (MULTIVITAMIN WITH MINERALS) TABS tablet Take 1 tablet by mouth daily.     [provider]  pantoprazole (PROTONIX) 40 MG tablet Take 40 mg by mouth daily. 02/21/18   [provider]  furosemide (LASIX) 20 MG tablet Take 1 tablet (20 mg total) by mouth daily. Patient not taking: Reported on 07/16/2019 07/02/18 07/23/19  [name], MD    Family History Family History  Problem Relation Age of Onset   Colon cancer Father 80   Colon cancer Brother 35   Heart attack Brother    Heart disease Mother    Heart attack Mother    Diabetes Sister    Stroke Sister     Social History Social History   Tobacco Use   Smoking status: Former Smoker    Packs/day: 2.00    Years: 40.00    Pack years: 80.00    Quit date: 03/31/1999    Years since quitting: 20.7   Smokeless tobacco: Never Used  Vaping Use   Vaping Use: Never used  Substance Use Topics   Alcohol use: Not Currently   Drug use: No     Allergies   Patient has no known allergies.   Review of Systems Review of Systems  Constitutional: Positive for activity change, appetite change, chills, fatigue and fever. Negative for diaphoresis.  HENT: Positive for congestion, hearing loss, sinus pressure and sore throat. Negative for ear discharge, ear pain, facial swelling, nosebleeds, postnasal drip, rhinorrhea and trouble swallowing.   Eyes: Negative for pain, discharge, redness and visual disturbance.  Respiratory: Positive for cough, shortness of breath and wheezing.   Cardiovascular: Negative for chest pain.  Gastrointestinal: Positive for diarrhea and nausea. Negative for vomiting.  Musculoskeletal: Positive for arthralgias, back pain and myalgias. Negative for neck pain and neck stiffness.  Skin: Negative for color change and rash.  Allergic/Immunologic: Positive for environmental allergies. Negative for food allergies.  Neurological: Positive for light-headedness and headaches. Negative for tremors, seizures and syncope.  Hematological: Negative for adenopathy.  Bruises/bleeds easily.     Physical Exam Triage Vital Signs ED Triage Vitals [12/28/19 1242]  Enc Vitals Group     BP (!) 143/88     Pulse Rate (!) 54     Resp 18     Temp 98.1 F (36.7 C)     Temp Source Oral     SpO2 97 %     Weight      Height      Head Circumference      Peak Flow      Pain Score 0     Pain Loc      Pain Edu?      Excl. in [place]?    No data found.  Updated Vital Signs BP (!) 143/88 (BP Location: Left Arm)    Pulse (!) 54    Temp 98.1 F (36.7 C) (Oral)    Resp 18    SpO2 97%   Visual Acuity Right Eye Distance:   Left Eye Distance:   Bilateral Distance:    Right Eye Near:   Left Eye Near:    Bilateral Near:     Physical Exam Vitals and nursing note reviewed.  Constitutional:      General: He is awake. He is not in acute distress.    Appearance: He is well-developed and well-groomed. He is ill-appearing.     Comments: He is sitting comfortably on the exam table in no acute distress but appears ill and tired.   HENT:     Head: Normocephalic and atraumatic.     Right Ear: Tympanic membrane, ear canal and external ear normal. Decreased hearing noted.     Left Ear: Tympanic membrane, ear canal and external ear normal. Decreased hearing noted.     Nose: Congestion present.     Right Sinus: No maxillary sinus tenderness or frontal sinus tenderness.     Left Sinus: No maxillary sinus tenderness or frontal sinus tenderness.     Mouth/Throat:     Lips: Pink.     Mouth: Mucous membranes are moist.     Dentition: Has dentures.     Pharynx: Oropharynx is clear. Uvula midline. Posterior oropharyngeal erythema present. No pharyngeal swelling, oropharyngeal exudate or uvula swelling.  Eyes:     Extraocular Movements: Extraocular movements intact.     Conjunctiva/sclera: Conjunctivae normal.  Cardiovascular:     Rate and Rhythm: Regular rhythm. Bradycardia present.     Heart sounds: No murmur heard.  Comments: Heart rate is 58. Normal rate for him  is around 60.  Pulmonary:     Effort: Pulmonary effort is normal. No tachypnea, accessory muscle usage, prolonged expiration, respiratory distress or retractions.     Breath sounds: Normal air entry. Examination of the right-upper field reveals decreased breath sounds and wheezing. Examination of the left-upper field reveals decreased breath sounds and wheezing. Decreased breath sounds and wheezing present. No rhonchi or rales.     Comments: Mild wheezes and quieter breath sounds in the upper lobes bilaterally.  Musculoskeletal:     Cervical back: Normal range of motion and neck supple. No rigidity.  Lymphadenopathy:     Cervical: No cervical adenopathy.  Skin:    General: Skin is warm and dry.     Capillary Refill: Capillary refill takes less than 2 seconds.  Neurological:     Mental Status: He is alert and oriented to person, place, and time. He is confused.     Comments: He is alert and oriented but does appear slightly confused- uncertain if this is due to decreased hearing and unable to hear and understand health care providers with masks and face shields on.   Psychiatric:        Attention and Perception: Attention normal.        Mood and Affect: Mood normal.        Speech: Speech normal.        Behavior: Behavior normal. Behavior is cooperative.        Cognition and Memory: He exhibits impaired recent memory.     Comments: Asked about most recent visit here to Urgent Care about 1 month ago and does not remember much about medications and treatment at that time.       UC Treatments / Results  Labs (all labs ordered are listed, but only abnormal results are displayed) Labs Reviewed  NOVEL CORONAVIRUS, NAA    EKG   Radiology No results found.  Procedures Procedures (including critical care time)  Medications Ordered in UC Medications - No data to display  Initial Impression / Assessment and Plan / UC Course  I have reviewed the triage vital signs and the nursing  notes.  Pertinent labs & imaging results that were available during my care of the patient were reviewed by me and considered in my medical decision making (see chart for details).    Reviewed with patient that he appears to have a viral upper respiratory illness. Doubt pneumonia or other bacterial infection at this time but need to continue to monitor symptoms. Unable to perform chest x-ray here today due to staffing. Discussed possibility of COVID 19 but may be other viral illnesses that are circulating in the community. Patient indicated that he has "multiple inhalers" at home. With further clarification, it appears he has multiple canisters of Albuterol inhaler but not different medications. Discussed hesitancy about prescribing oral steroids for wheezing and cough- he has a history of bleeding and his blood pressure is elevated today and he is no longer on blood pressure medication and only on aspirin and Lipitor for Cardiac issues. Will trial Advair 1 puff every 12 hours as directed. Continue Albuterol inhaler 2 puffs every 4 to 6 hours as needed. May continue Tessalon cough pills 1 every 8 hours as needed. Increase fluids to help loosen up mucus in chest and to stay hydrated, especially with diarrhea. Rest. Stay at home. Note written for work. Follow-up pending COVID 19 test result and in 3 to 4  days if not improving.    Final Clinical Impressions(s) / UC Diagnoses   Final diagnoses:  Encounter for screening for COVID-19  Cough  Wheezing  Nasal congestion  Body aches     Discharge Instructions     Recommend add Advair inhaler (has steroid in it to help with breathing and wheezing) twice a day. May continue Albuterol inhaler 2 puffs every 4 hours as needed for wheezing and cough. May continue Tessalon cough pills every 8 hours as needed. Continue to push fluids to stay well hydrated. Rest. Stay at home. Follow-up pending COVID 19 test results and in 3 to 4 days if not improving.     ED  Prescriptions    Medication Sig Dispense Auth. Provider   Fluticasone-Salmeterol (ADVAIR DISKUS) 250-50 MCG/DOSE AEPB Inhale 1 puff into the lungs in the morning and at bedtime. 60 each Kewanda [place], Nicholes Stairs, NP   benzonatate (TESSALON) 100 MG capsule Take 1 capsule (100 mg total) by mouth 3 (three) times daily as needed for cough. 30 capsule Axzel [place], Nicholes Stairs, NP     PDMP not reviewed this encounter.   Katy Apo, NP 12/28/19 1734

## 2019-12-28 NOTE — ED Triage Notes (Signed)
Pt c/o cough, nasal congestion, sore throat, fatigue, weakness, and decrease taste and smell x1wk. States using his inhaler more often this week d/t SOB. No distress, speaking in complete sentences.

## 2019-12-28 NOTE — Discharge Instructions (Addendum)
Recommend add Advair inhaler (has steroid in it to help with breathing and wheezing) twice a day. May continue Albuterol inhaler 2 puffs every 4 hours as needed for wheezing and cough. May continue Tessalon cough pills every 8 hours as needed. Continue to push fluids to stay well hydrated. Rest. Stay at home. Follow-up pending COVID 19 test results and in 3 to 4 days if not improving.

## 2019-12-29 LAB — SARS-COV-2, NAA 2 DAY TAT

## 2019-12-29 LAB — NOVEL CORONAVIRUS, NAA: SARS-CoV-2, NAA: NOT DETECTED

## 2020-01-09 ENCOUNTER — Inpatient Hospital Stay: Payer: 59 | Attending: Hematology

## 2020-01-09 ENCOUNTER — Ambulatory Visit: Payer: 59 | Attending: Internal Medicine

## 2020-01-09 ENCOUNTER — Other Ambulatory Visit: Payer: Self-pay

## 2020-01-09 DIAGNOSIS — Z23 Encounter for immunization: Secondary | ICD-10-CM

## 2020-01-09 DIAGNOSIS — D649 Anemia, unspecified: Secondary | ICD-10-CM | POA: Diagnosis not present

## 2020-01-09 DIAGNOSIS — I2510 Atherosclerotic heart disease of native coronary artery without angina pectoris: Secondary | ICD-10-CM | POA: Insufficient documentation

## 2020-01-09 DIAGNOSIS — G8929 Other chronic pain: Secondary | ICD-10-CM | POA: Insufficient documentation

## 2020-01-09 DIAGNOSIS — M79606 Pain in leg, unspecified: Secondary | ICD-10-CM | POA: Diagnosis not present

## 2020-01-09 DIAGNOSIS — I509 Heart failure, unspecified: Secondary | ICD-10-CM | POA: Diagnosis not present

## 2020-01-09 DIAGNOSIS — C154 Malignant neoplasm of middle third of esophagus: Secondary | ICD-10-CM

## 2020-01-09 DIAGNOSIS — E119 Type 2 diabetes mellitus without complications: Secondary | ICD-10-CM | POA: Insufficient documentation

## 2020-01-09 DIAGNOSIS — Z8501 Personal history of malignant neoplasm of esophagus: Secondary | ICD-10-CM | POA: Insufficient documentation

## 2020-01-09 LAB — COMPREHENSIVE METABOLIC PANEL
ALT: 16 U/L (ref 0–44)
AST: 23 U/L (ref 15–41)
Albumin: 3.7 g/dL (ref 3.5–5.0)
Alkaline Phosphatase: 69 U/L (ref 38–126)
Anion gap: 5 (ref 5–15)
BUN: 7 mg/dL — ABNORMAL LOW (ref 8–23)
CO2: 30 mmol/L (ref 22–32)
Calcium: 8.8 mg/dL — ABNORMAL LOW (ref 8.9–10.3)
Chloride: 107 mmol/L (ref 98–111)
Creatinine, Ser: 0.99 mg/dL (ref 0.61–1.24)
GFR, Estimated: >60 mL/min (ref >60)
Glucose, Bld: 104 mg/dL — ABNORMAL HIGH (ref 70–99)
Potassium: 4 mmol/L (ref 3.5–5.1)
Sodium: 142 mmol/L (ref 135–145)
Total Bilirubin: 0.5 mg/dL (ref 0.3–1.2)
Total Protein: 6.8 g/dL (ref 6.5–8.1)

## 2020-01-09 LAB — CBC WITH DIFFERENTIAL/PLATELET
Abs Immature Granulocytes: 0 10*3/uL (ref 0.00–0.07)
Basophils Absolute: 0 10*3/uL (ref 0.0–0.1)
Basophils Relative: 1 %
Eosinophils Absolute: 0.3 10*3/uL (ref 0.0–0.5)
Eosinophils Relative: 6 %
HCT: 37.4 % — ABNORMAL LOW (ref 39.0–52.0)
Hemoglobin: 11.4 g/dL — ABNORMAL LOW (ref 13.0–17.0)
Immature Granulocytes: 0 %
Lymphocytes Relative: 29 %
Lymphs Abs: 1.2 10*3/uL (ref 0.7–4.0)
MCH: 23.4 pg — ABNORMAL LOW (ref 26.0–34.0)
MCHC: 30.5 g/dL (ref 30.0–36.0)
MCV: 76.8 fL — ABNORMAL LOW (ref 80.0–100.0)
Monocytes Absolute: 0.4 10*3/uL (ref 0.1–1.0)
Monocytes Relative: 10 %
Neutro Abs: 2.2 10*3/uL (ref 1.7–7.7)
Neutrophils Relative %: 54 %
Platelets: 218 10*3/uL (ref 150–400)
RBC: 4.87 MIL/uL (ref 4.22–5.81)
RDW: 15.9 % — ABNORMAL HIGH (ref 11.5–15.5)
WBC: 4.2 10*3/uL (ref 4.0–10.5)
nRBC: 0 % (ref 0.0–0.2)

## 2020-01-09 NOTE — Progress Notes (Signed)
   Covid-19 Vaccination [facility]  Name:  Juan [name]    MRN: [id number] DOB: 08-15-1940  01/09/2020  Mr. Juan [name] was observed post Covid-19 immunization for 15 minutes without incident. He was provided with Vaccine Information Sheet and instruction to access the V-Safe system.   Mr. Juan [name] was instructed to call 911 with any severe reactions post vaccine: Marland Kitchen Difficulty breathing  . Swelling of face and throat  . A fast heartbeat  . A bad rash all over body  . Dizziness and weakness

## 2020-01-11 ENCOUNTER — Telehealth: Payer: Self-pay

## 2020-01-11 NOTE — Telephone Encounter (Signed)
I spoke with Mr Makki.  He did not have his ct scan done.  I have d/c his appt with Dr. Burr Medico for tomorrow.  I sent a request for a new PA.

## 2020-01-12 ENCOUNTER — Inpatient Hospital Stay: Payer: 59 | Admitting: Hematology

## 2020-01-13 ENCOUNTER — Encounter (HOSPITAL_COMMUNITY): Payer: Self-pay | Admitting: Emergency Medicine

## 2020-01-13 ENCOUNTER — Emergency Department (HOSPITAL_COMMUNITY): Payer: 59

## 2020-01-13 ENCOUNTER — Inpatient Hospital Stay (HOSPITAL_COMMUNITY)
Admission: EM | Admit: 2020-01-13 | Discharge: 2020-01-17 | DRG: 286 | Disposition: A | Discharge status: Home / Self Care | Payer: 59 | Attending: Internal Medicine | Admitting: Internal Medicine

## 2020-01-13 ENCOUNTER — Other Ambulatory Visit: Payer: Self-pay

## 2020-01-13 DIAGNOSIS — E782 Mixed hyperlipidemia: Secondary | ICD-10-CM | POA: Diagnosis present

## 2020-01-13 DIAGNOSIS — K219 Gastro-esophageal reflux disease without esophagitis: Secondary | ICD-10-CM | POA: Diagnosis present

## 2020-01-13 DIAGNOSIS — C159 Malignant neoplasm of esophagus, unspecified: Secondary | ICD-10-CM | POA: Diagnosis present

## 2020-01-13 DIAGNOSIS — H9191 Unspecified hearing loss, right ear: Secondary | ICD-10-CM | POA: Diagnosis present

## 2020-01-13 DIAGNOSIS — R0789 Other chest pain: Secondary | ICD-10-CM

## 2020-01-13 DIAGNOSIS — I110 Hypertensive heart disease with heart failure: Secondary | ICD-10-CM | POA: Diagnosis present

## 2020-01-13 DIAGNOSIS — Z853 Personal history of malignant neoplasm of breast: Secondary | ICD-10-CM | POA: Diagnosis not present

## 2020-01-13 DIAGNOSIS — Z7982 Long term (current) use of aspirin: Secondary | ICD-10-CM

## 2020-01-13 DIAGNOSIS — Z8501 Personal history of malignant neoplasm of esophagus: Secondary | ICD-10-CM | POA: Diagnosis not present

## 2020-01-13 DIAGNOSIS — C154 Malignant neoplasm of middle third of esophagus: Secondary | ICD-10-CM | POA: Diagnosis not present

## 2020-01-13 DIAGNOSIS — Z7951 Long term (current) use of inhaled steroids: Secondary | ICD-10-CM

## 2020-01-13 DIAGNOSIS — J432 Centrilobular emphysema: Secondary | ICD-10-CM | POA: Diagnosis present

## 2020-01-13 DIAGNOSIS — I340 Nonrheumatic mitral (valve) insufficiency: Secondary | ICD-10-CM | POA: Diagnosis not present

## 2020-01-13 DIAGNOSIS — E785 Hyperlipidemia, unspecified: Secondary | ICD-10-CM | POA: Diagnosis not present

## 2020-01-13 DIAGNOSIS — E119 Type 2 diabetes mellitus without complications: Secondary | ICD-10-CM | POA: Diagnosis present

## 2020-01-13 DIAGNOSIS — R0602 Shortness of breath: Secondary | ICD-10-CM | POA: Diagnosis not present

## 2020-01-13 DIAGNOSIS — I5021 Acute systolic (congestive) heart failure: Secondary | ICD-10-CM | POA: Diagnosis present

## 2020-01-13 DIAGNOSIS — I5042 Chronic combined systolic (congestive) and diastolic (congestive) heart failure: Secondary | ICD-10-CM | POA: Diagnosis present

## 2020-01-13 DIAGNOSIS — I447 Left bundle-branch block, unspecified: Secondary | ICD-10-CM | POA: Diagnosis present

## 2020-01-13 DIAGNOSIS — J439 Emphysema, unspecified: Secondary | ICD-10-CM | POA: Diagnosis not present

## 2020-01-13 DIAGNOSIS — I2510 Atherosclerotic heart disease of native coronary artery without angina pectoris: Secondary | ICD-10-CM | POA: Diagnosis not present

## 2020-01-13 DIAGNOSIS — E876 Hypokalemia: Secondary | ICD-10-CM | POA: Diagnosis present

## 2020-01-13 DIAGNOSIS — Z20822 Contact with and (suspected) exposure to covid-19: Secondary | ICD-10-CM | POA: Diagnosis present

## 2020-01-13 DIAGNOSIS — I420 Dilated cardiomyopathy: Secondary | ICD-10-CM | POA: Diagnosis present

## 2020-01-13 DIAGNOSIS — G609 Hereditary and idiopathic neuropathy, unspecified: Secondary | ICD-10-CM | POA: Diagnosis present

## 2020-01-13 DIAGNOSIS — Z79899 Other long term (current) drug therapy: Secondary | ICD-10-CM | POA: Diagnosis not present

## 2020-01-13 DIAGNOSIS — I428 Other cardiomyopathies: Secondary | ICD-10-CM | POA: Diagnosis present

## 2020-01-13 DIAGNOSIS — Z9221 Personal history of antineoplastic chemotherapy: Secondary | ICD-10-CM | POA: Diagnosis not present

## 2020-01-13 DIAGNOSIS — K5790 Diverticulosis of intestine, part unspecified, without perforation or abscess without bleeding: Secondary | ICD-10-CM | POA: Diagnosis present

## 2020-01-13 DIAGNOSIS — I5041 Acute combined systolic (congestive) and diastolic (congestive) heart failure: Secondary | ICD-10-CM | POA: Diagnosis not present

## 2020-01-13 DIAGNOSIS — I700 Atherosclerosis of aorta: Secondary | ICD-10-CM

## 2020-01-13 DIAGNOSIS — I25110 Atherosclerotic heart disease of native coronary artery with unstable angina pectoris: Principal | ICD-10-CM | POA: Diagnosis present

## 2020-01-13 DIAGNOSIS — R110 Nausea: Secondary | ICD-10-CM | POA: Diagnosis not present

## 2020-01-13 DIAGNOSIS — R079 Chest pain, unspecified: Secondary | ICD-10-CM | POA: Diagnosis not present

## 2020-01-13 DIAGNOSIS — K2270 Barrett's esophagus without dysplasia: Secondary | ICD-10-CM | POA: Diagnosis present

## 2020-01-13 DIAGNOSIS — Z87891 Personal history of nicotine dependence: Secondary | ICD-10-CM | POA: Diagnosis not present

## 2020-01-13 DIAGNOSIS — Z833 Family history of diabetes mellitus: Secondary | ICD-10-CM

## 2020-01-13 DIAGNOSIS — I200 Unstable angina: Secondary | ICD-10-CM

## 2020-01-13 DIAGNOSIS — R030 Elevated blood-pressure reading, without diagnosis of hypertension: Secondary | ICD-10-CM

## 2020-01-13 DIAGNOSIS — M5135 Other intervertebral disc degeneration, thoracolumbar region: Secondary | ICD-10-CM | POA: Diagnosis present

## 2020-01-13 DIAGNOSIS — Z923 Personal history of irradiation: Secondary | ICD-10-CM

## 2020-01-13 DIAGNOSIS — I10 Essential (primary) hypertension: Secondary | ICD-10-CM | POA: Diagnosis not present

## 2020-01-13 DIAGNOSIS — N200 Calculus of kidney: Secondary | ICD-10-CM | POA: Diagnosis not present

## 2020-01-13 DIAGNOSIS — Z8249 Family history of ischemic heart disease and other diseases of the circulatory system: Secondary | ICD-10-CM

## 2020-01-13 LAB — C-REACTIVE PROTEIN: CRP: <0.5 mg/dL (ref <1.0)

## 2020-01-13 LAB — RESPIRATORY PANEL BY RT PCR (FLU A&B, COVID)
Influenza A by PCR: NEGATIVE
Influenza B by PCR: NEGATIVE
SARS Coronavirus 2 by RT PCR: NEGATIVE

## 2020-01-13 LAB — TROPONIN I (HIGH SENSITIVITY)
Troponin I (High Sensitivity): 22 ng/L — ABNORMAL HIGH (ref <18)
Troponin I (High Sensitivity): 25 ng/L — ABNORMAL HIGH (ref <18)
Troponin I (High Sensitivity): 21 ng/L — ABNORMAL HIGH (ref <18)

## 2020-01-13 LAB — CBC
HCT: 37.5 % — ABNORMAL LOW (ref 39.0–52.0)
Hemoglobin: 11.2 g/dL — ABNORMAL LOW (ref 13.0–17.0)
MCH: 23 pg — ABNORMAL LOW (ref 26.0–34.0)
MCHC: 29.9 g/dL — ABNORMAL LOW (ref 30.0–36.0)
MCV: 77 fL — ABNORMAL LOW (ref 80.0–100.0)
Platelets: 231 10*3/uL (ref 150–400)
RBC: 4.87 MIL/uL (ref 4.22–5.81)
RDW: 15.6 % — ABNORMAL HIGH (ref 11.5–15.5)
WBC: 2.8 10*3/uL — ABNORMAL LOW (ref 4.0–10.5)
nRBC: 0 % (ref 0.0–0.2)

## 2020-01-13 LAB — BASIC METABOLIC PANEL
Anion gap: 8 (ref 5–15)
BUN: 9 mg/dL (ref 8–23)
CO2: 26 mmol/L (ref 22–32)
Calcium: 8.5 mg/dL — ABNORMAL LOW (ref 8.9–10.3)
Chloride: 108 mmol/L (ref 98–111)
Creatinine, Ser: 1 mg/dL (ref 0.61–1.24)
GFR, Estimated: >60 mL/min (ref >60)
Glucose, Bld: 123 mg/dL — ABNORMAL HIGH (ref 70–99)
Potassium: 3.3 mmol/L — ABNORMAL LOW (ref 3.5–5.1)
Sodium: 142 mmol/L (ref 135–145)

## 2020-01-13 LAB — CBG MONITORING, ED
Glucose-Capillary: 151 mg/dL — ABNORMAL HIGH (ref 70–99)
Glucose-Capillary: 92 mg/dL (ref 70–99)

## 2020-01-13 LAB — SEDIMENTATION RATE: Sed Rate: 3 mm/hr (ref 0–16)

## 2020-01-13 LAB — D-DIMER, QUANTITATIVE: D-Dimer, Quant: 0.42 ug/mL-FEU (ref 0.00–0.50)

## 2020-01-13 MED ORDER — GABAPENTIN 300 MG PO CAPS
300.0000 mg | ORAL_CAPSULE | Freq: Two times a day (BID) | ORAL | Status: DC
  Administered 2020-01-13 – 2020-01-17 (×8): 300 mg via ORAL
  Filled 2020-01-13 (×8): qty 1

## 2020-01-13 MED ORDER — HYDRALAZINE HCL 20 MG/ML IJ SOLN: 10.0000 mg | INTRAMUSCULAR | Status: DC | PRN

## 2020-01-13 MED ORDER — ASPIRIN 325 MG PO TABS
325.0000 mg | ORAL_TABLET | Freq: Once | ORAL | Status: AC
  Administered 2020-01-13: 325 mg via ORAL
  Filled 2020-01-13: qty 1

## 2020-01-13 MED ORDER — CELECOXIB 200 MG PO CAPS
200.0000 mg | ORAL_CAPSULE | Freq: Every day | ORAL | Status: DC
  Administered 2020-01-14 – 2020-01-17 (×4): 200 mg via ORAL
  Filled 2020-01-13 (×5): qty 1

## 2020-01-13 MED ORDER — MORPHINE SULFATE (PF) 2 MG/ML IV SOLN: 2.0000 mg | INTRAVENOUS | Status: DC | PRN

## 2020-01-13 MED ORDER — ONDANSETRON HCL 4 MG/2ML IJ SOLN: 4.0000 mg | Freq: Four times a day (QID) | INTRAMUSCULAR | Status: DC | PRN

## 2020-01-13 MED ORDER — PANTOPRAZOLE SODIUM 40 MG PO TBEC: 40.0000 mg | DELAYED_RELEASE_TABLET | Freq: Every day | ORAL | Status: DC

## 2020-01-13 MED ORDER — FLUTICASONE FUROATE-VILANTEROL 200-25 MCG/INH IN AEPB
1.0000 | INHALATION_SPRAY | Freq: Every day | RESPIRATORY_TRACT | Status: DC
  Administered 2020-01-15 – 2020-01-17 (×3): 1 via RESPIRATORY_TRACT
  Filled 2020-01-13 (×2): qty 28

## 2020-01-13 MED ORDER — ATORVASTATIN CALCIUM 10 MG PO TABS
20.0000 mg | ORAL_TABLET | Freq: Every day | ORAL | Status: DC
  Administered 2020-01-14: 20 mg via ORAL
  Filled 2020-01-13: qty 2

## 2020-01-13 MED ORDER — ACETAMINOPHEN 325 MG PO TABS
650.0000 mg | ORAL_TABLET | ORAL | Status: DC | PRN
  Administered 2020-01-15 – 2020-01-16 (×6): 650 mg via ORAL
  Filled 2020-01-13 (×7): qty 2

## 2020-01-13 MED ORDER — INSULIN ASPART 100 UNIT/ML ~~LOC~~ SOLN
0.0000 [IU] | SUBCUTANEOUS | Status: DC
  Administered 2020-01-15 – 2020-01-17 (×6): 1 [IU] via SUBCUTANEOUS

## 2020-01-13 MED ORDER — PANTOPRAZOLE SODIUM 40 MG PO TBEC
40.0000 mg | DELAYED_RELEASE_TABLET | Freq: Every day | ORAL | Status: DC
  Administered 2020-01-14 – 2020-01-17 (×4): 40 mg via ORAL
  Filled 2020-01-13 (×4): qty 1

## 2020-01-13 MED ORDER — FAMOTIDINE IN NACL 20-0.9 MG/50ML-% IV SOLN
20.0000 mg | Freq: Once | INTRAVENOUS | Status: AC
  Administered 2020-01-13: 20 mg via INTRAVENOUS
  Filled 2020-01-13: qty 50

## 2020-01-13 MED ORDER — IOHEXOL 300 MG/ML  SOLN
100.0000 mL | Freq: Once | INTRAMUSCULAR | Status: AC | PRN
  Administered 2020-01-13: 100 mL via INTRAVENOUS

## 2020-01-13 MED ORDER — ASPIRIN EC 81 MG PO TBEC
81.0000 mg | DELAYED_RELEASE_TABLET | Freq: Every day | ORAL | Status: DC
  Administered 2020-01-14 – 2020-01-17 (×3): 81 mg via ORAL
  Filled 2020-01-13 (×3): qty 1

## 2020-01-13 MED ORDER — ENOXAPARIN SODIUM 40 MG/0.4ML ~~LOC~~ SOLN
40.0000 mg | SUBCUTANEOUS | Status: DC
  Administered 2020-01-13 – 2020-01-14 (×2): 40 mg via SUBCUTANEOUS
  Filled 2020-01-13 (×2): qty 0.4

## 2020-01-13 MED ORDER — ALBUTEROL SULFATE HFA 108 (90 BASE) MCG/ACT IN AERS
2.0000 | INHALATION_SPRAY | Freq: Four times a day (QID) | RESPIRATORY_TRACT | Status: DC | PRN
  Filled 2020-01-13: qty 6.7

## 2020-01-13 MED ORDER — NITROGLYCERIN 0.4 MG SL SUBL: 0.4000 mg | SUBLINGUAL_TABLET | SUBLINGUAL | Status: DC | PRN

## 2020-01-13 MED ORDER — AMITRIPTYLINE HCL 10 MG PO TABS
10.0000 mg | ORAL_TABLET | Freq: Every day | ORAL | Status: DC
  Administered 2020-01-13 – 2020-01-16 (×4): 10 mg via ORAL
  Filled 2020-01-13 (×6): qty 1

## 2020-01-13 MED ORDER — FENTANYL CITRATE (PF) 100 MCG/2ML IJ SOLN
25.0000 ug | Freq: Once | INTRAMUSCULAR | Status: AC
  Administered 2020-01-13: 25 ug via INTRAVENOUS
  Filled 2020-01-13: qty 2

## 2020-01-13 MED ORDER — FLUTICASONE PROPIONATE 50 MCG/ACT NA SUSP
1.0000 | Freq: Every day | NASAL | Status: DC | PRN
  Administered 2020-01-16: 1 via NASAL
  Filled 2020-01-13 (×2): qty 16

## 2020-01-13 NOTE — ED Provider Notes (Signed)
[facility] EMERGENCY DEPARTMENT Provider Note   CSN: [id number] Arrival date & time: 01/13/20  1039     History Chief Complaint  Patient presents with  . Chest Pain    Juan [name] is a 79 y.o. male.  HPI      This is a 79 year old male with a history of diabetes, hyperlipidemia who presents with chest discomfort and shortness of breath.  Patient reports 3-week history of intermittent left-sided chest discomfort.  He states that he feels like his left chest is somewhat swollen compared to his right.  He denies any recent heavy lifting or trauma.  Patient denies exertional symptoms.  Nothing seems to make his symptoms better or worse.  He states that this morning he had worsening shortness of breath and nausea.  No recent cough or fevers.  He is currently chest pain-free.  He did not take anything for his symptoms.  Denies any symptoms related to food.  Denies any lower extremity swelling or history of blood clots.  He is a former smoker.   Past Medical History:  Diagnosis Date  . Aortic atherosclerosis ([place])   . Back pain   . Barrett esophagus   . Bleeding nose   . DDD (degenerative disc disease), thoracolumbar    Moderate to severe  . Diabetes mellitus without complication ([place])   . Diverticulosis   . Esophageal cancer ([place]) dx'd 2017  . GERD (gastroesophageal reflux disease)   . Headache   . Hearing loss    Right ear  . History of umbilical hernia repair   . Idiopathic peripheral neuropathy   . Iron deficiency anemia   . LBBB (left bundle branch block) 02/11/2016  . Left hip pain    Severe degenerative changes  . Lipoma of neck    Right  . Mixed hyperlipidemia   . PONV (postoperative nausea and vomiting)   . Reflux   . Right thyroid nodule     Patient Active Problem List   Diagnosis Date Noted  . Osteoarthritis of left hip 07/22/2017  . Iron deficiency anemia 05/17/2017  . LBBB (left bundle branch block) 02/11/2016  . Port catheter in  place 12/25/2015  . 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 Esophageal cancer ([place]) 11/22/2015  . Noise effect on both inner ears 08/29/2015  . Right thyroid nodule 08/29/2015    Past Surgical History:  Procedure Laterality Date  . BALLOON DILATION N/A 05/06/2018   Procedure: BALLOON DILATION;  Surgeon: Carol Ada, MD;  Location: [facility];  Service: Endoscopy;  Laterality: N/A;  . COLONOSCOPY    . ESOPHAGOGASTRODUODENOSCOPY (EGD) WITH PROPOFOL N/A 05/06/2018   Procedure: ESOPHAGOGASTRODUODENOSCOPY (EGD) WITH PROPOFOL;  Surgeon: Carol Ada, MD;  Location: [facility];  Service: Endoscopy;  Laterality: N/A;  . EUS N/A 12/05/2015   Procedure: UPPER ENDOSCOPIC ULTRASOUND (EUS) LINEAR;  Surgeon: Carol Ada, MD;  Location: [facility];  Service: Endoscopy;  Laterality: N/A;  . EUS N/A 03/10/2016   Procedure: UPPER ENDOSCOPIC ULTRASOUND (EUS) LINEAR;  Surgeon: Carol Ada, MD;  Location: [facility];  Service: Endoscopy;  Laterality: N/A;  . EUS N/A 01/07/2017   Procedure: UPPER ENDOSCOPIC ULTRASOUND (EUS) LINEAR;  Surgeon: Carol Ada, MD;  Location: [facility];  Service: Endoscopy;  Laterality: N/A;  . HERNIA REPAIR    . IR GENERIC HISTORICAL  12/24/2015   IR FLUORO GUIDE PORT INSERTION RIGHT 12/24/2015 Arne Cleveland, MD [facility]  . IR GENERIC HISTORICAL  12/24/2015   IR US  GUIDE VASC ACCESS RIGHT 12/24/2015 Arne Cleveland, MD [facility]  . IR REMOVAL TUN ACCESS W/ PORT W/O FL MOD SED  02/07/2018  . PORTA CATH INSERTION    . SHOULDER ARTHROSCOPY W/ SUPERIOR LABRAL ANTERIOR POSTERIOR LESION REPAIR     times 2  . TOTAL HIP ARTHROPLASTY Left 07/22/2017   Procedure: LEFT TOTAL HIP ARTHROPLASTY ANTERIOR APPROACH;  Surgeon: Rod Can, MD;  Location: [facility];  Service: Orthopedics;  Laterality: Left;  . UPPER GI ENDOSCOPY  01/07/2017       Family History  Problem Relation Age of Onset  . Colon cancer Father 66  . Colon cancer  Brother 101  . Heart attack Brother   . Heart disease Mother   . Heart attack Mother   . Diabetes Sister   . Stroke Sister     Social History   Tobacco Use  . Smoking status: Former Smoker    Packs/day: 2.00    Years: 40.00    Pack years: 80.00    Quit date: 03/31/1999    Years since quitting: 20.8  . Smokeless tobacco: Never Used  Vaping Use  . Vaping Use: Never used  Substance Use Topics  . Alcohol use: Not Currently  . Drug use: No    Home Medications Prior to Admission medications   Medication Sig Start Date End Date Taking? Authorizing Provider  albuterol (VENTOLIN HFA) 108 (90 Base) MCG/ACT inhaler Inhale 1-2 puffs into the lungs every 6 (six) hours as needed for wheezing or shortness of breath.  06/14/19   [provider]  amitriptyline (ELAVIL) 10 MG tablet Take 10 mg by mouth at bedtime. 03/11/18   [provider]  aspirin EC 81 MG tablet Take 81 mg by mouth daily.    [provider]  B Complex Vitamins (B COMPLEX 1 PO) Take 1 tablet by mouth daily.    [provider]  benzonatate (TESSALON) 100 MG capsule Take 1 capsule (100 mg total) by mouth 3 (three) times daily as needed for cough. 12/28/19   Katy Apo, NP  celecoxib (CELEBREX) 200 MG capsule Take 200 mg by mouth daily. 04/17/19   [provider]  cetirizine (ZYRTEC ALLERGY) 10 MG tablet Take 1 tablet (10 mg total) by mouth daily. 11/25/19   Hall-Potvin, [place], PA-C  fluticasone (FLONASE) 50 MCG/ACT nasal spray Place 1 spray into both nostrils daily. 11/25/19   Hall-Potvin, [place], PA-C  Fluticasone-Salmeterol (ADVAIR DISKUS) 250-50 MCG/DOSE AEPB Inhale 1 puff into the lungs in the morning and at bedtime. 12/28/19   Katy Apo, NP  gabapentin (NEURONTIN) 300 MG capsule Take 300 mg by mouth 2 (two) times daily.    [provider]  glimepiride (AMARYL) 1 MG tablet Take 1 mg by mouth daily after breakfast.  02/22/18   [provider]  LIPITOR 20  MG tablet Take 20 mg by mouth every evening. 02/21/18   [provider]  Multiple Vitamin (MULTIVITAMIN WITH MINERALS) TABS tablet Take 1 tablet by mouth daily.    [provider]  pantoprazole (PROTONIX) 40 MG tablet Take 40 mg by mouth daily. 02/21/18   [provider]  furosemide (LASIX) 20 MG tablet Take 1 tablet (20 mg total) by mouth daily. Patient not taking: Reported on 07/16/2019 07/02/18 07/23/19  [name], MD    Allergies    Patient has no known allergies.  Review of Systems   Review of Systems  Constitutional: Negative for chills and fever.  Respiratory: Positive for  shortness of breath. Negative for cough.   Cardiovascular: Positive for chest pain. Negative for leg swelling.  Gastrointestinal: Positive for nausea. Negative for abdominal pain, diarrhea and vomiting.  Genitourinary: Negative for dysuria.  All other systems reviewed and are negative.   Physical Exam Updated Vital Signs BP (!) 142/84 (BP Location: Right Arm)   Pulse (!) 57   Temp 97.8 F (36.6 C) (Oral)   Resp 16   Ht 1.753 m (5\' 9" )   Wt 93.4 kg   SpO2 97%   BMI 30.42 kg/m   Physical Exam Vitals and nursing note reviewed.  Constitutional:      Appearance: He is well-developed. He is not ill-appearing.  HENT:     Head: Normocephalic and atraumatic.  Eyes:     Pupils: Pupils are equal, round, and reactive to light.  Cardiovascular:     Rate and Rhythm: Normal rate and regular rhythm.     Heart sounds: Normal heart sounds. No murmur heard.   Pulmonary:     Effort: Pulmonary effort is normal. No respiratory distress.     Breath sounds: Normal breath sounds. No wheezing.  Chest:     Chest wall: Tenderness present.  Abdominal:     General: Bowel sounds are normal.     Palpations: Abdomen is soft.     Tenderness: There is no abdominal tenderness. There is no rebound.  Musculoskeletal:     Cervical back: Neck supple.     Right lower leg: No tenderness. No  edema.     Left lower leg: No tenderness. No edema.  Lymphadenopathy:     Cervical: No cervical adenopathy.  Skin:    General: Skin is warm and dry.  Neurological:     Mental Status: He is alert and oriented to person, place, and time.  Psychiatric:        Mood and Affect: Mood normal.     ED Results / Procedures / Treatments   Labs (all labs ordered are listed, but only abnormal results are displayed) Labs Reviewed  BASIC METABOLIC PANEL - Abnormal; Notable for the following components:      Result Value   Potassium 3.3 (*)    Glucose, Bld 123 (*)    Calcium 8.5 (*)    All other components within normal limits  CBC - Abnormal; Notable for the following components:   WBC 2.8 (*)    Hemoglobin 11.2 (*)    HCT 37.5 (*)    MCV 77.0 (*)    MCH 23.0 (*)    MCHC 29.9 (*)    RDW 15.6 (*)    All other components within normal limits  TROPONIN I (HIGH SENSITIVITY) - Abnormal; Notable for the following components:   Troponin I (High Sensitivity) 22 (*)    All other components within normal limits  TROPONIN I (HIGH SENSITIVITY) - Abnormal; Notable for the following components:   Troponin I (High Sensitivity) 25 (*)    All other components within normal limits  RESPIRATORY PANEL BY RT PCR (FLU A&B, COVID)  D-DIMER, QUANTITATIVE (NOT AT [facility])  TROPONIN I (HIGH SENSITIVITY)    EKG EKG Interpretation  Date/Time:  Saturday January 13 2020 10:54:17 EDT Ventricular Rate:  56 PR Interval:  194 QRS Duration: 166 QT Interval:  508 QTC Calculation: 490 R Axis:   -19 Text Interpretation: Sinus bradycardia Left bundle branch block Abnormal ECG LBBB on prior EKG Confirmed by Thayer Jew [phone]) on 01/13/2020 12:39:10 PM   Radiology DG Chest 2 View  Result Date: 01/13/2020 CLINICAL DATA:  79 year old male with chest pain and shortness of breath. EXAM: CHEST - 2 VIEW COMPARISON:  None. FINDINGS: The heart size and mediastinal contours are within normal limits. Both lungs are  clear. Multilevel degenerative changes of the visualized thoracic spine. No acute osseous abnormality. IMPRESSION: No active cardiopulmonary disease. Electronically Signed   By: Ruthann Cancer MD   On: 01/13/2020 12:36    Procedures Procedures (including critical care time)  Medications Ordered in ED Medications - No data to display  ED Course  I have reviewed the triage vital signs and the nursing notes.  Pertinent labs & imaging results that were available during my care of the patient were reviewed by me and considered in my medical decision making (see chart for details).    MDM Rules/Calculators/A&P                          Patient presents with chest pain or shortness of breath.  Very atypical in nature.  There is a reproducible component.  Vital signs are reassuring.  EKG shows no acute ischemic changes or acute arrhythmia.  Considerations include but not limited to, ACS, PE, pneumothorax, pneumonia.  Could also be musculoskeletal in nature.  Chest x-ray without evidence of pneumothorax or pneumonia.  Initial troponin is marginally elevated at 25.  This is flat on second troponin.  He is not having active ongoing chest pain.  Awaiting D-dimer for PE with stratification.  Will likely need admission given elevated troponins and risk factors.  Final Clinical Impression(s) / ED Diagnoses Final diagnoses:  Atypical chest pain    Rx / DC Orders ED Discharge Orders    None       Merryl Hacker, MD 01/13/20 1554

## 2020-01-13 NOTE — ED Provider Notes (Addendum)
7:14 PM Patient awake and alert continues to complain of left-sided chest pain. D-dimer negative. Given the patient's absence of prior evaluation, has new positive troponin values, though they have been essentially unchanged for several hours, he will be admitted for further monitoring, management   Carmin Muskrat, MD 01/13/20 1914  7:43 PM I discussed the patient's case with our internal medicine colleague. As the patient is overdue for CT chest, abdomen pelvis given his history of esophageal cancer and this remains on the differential, these tests have been ordered.    Carmin Muskrat, MD 01/13/20 1943

## 2020-01-13 NOTE — ED Triage Notes (Signed)
Pt reports L sided chest pain with SOB and nausea since 6am.

## 2020-01-13 NOTE — H&P (Addendum)
History and Physical    Harvie Pessin NWG:[id number] DOB: November 24, 1940 DOA: 01/13/2020  PCP: Margorie John Georgianne Fick), MD (Inactive)  Patient coming from: Home  I have personally briefly reviewed patient's old medical records in [facility]  Chief Complaint: CP  HPI: Juan [name] is a 79 y.o. male with medical history significant of Esophageal CA currently in remission following radiation and chemo in 2017, prior smoking, DM2.  Pt presents to ED with c/o intermittent L chest pain.  Associated SOB.  Symptoms onset about 3 weeks ago.  Feels like L chest is "swolen" compared to right.  No recent heavy lifting or trauma.  This AM worsening SOB and nausea.  No cough, no fevers.  Had 2 doses of Pfizer in Jan, and a 3rd dose just 4 days ago.  No h/o blood clots, no LE swelling.   ED Course: Trops flat at 22, 25, 21.  EKG just shows LBBB (old).  CXR neg.  BPs running 170s-180s systolic consistently thus far in ED, no PMH of HTN though.  Not taking Protonix because insurance company is not covering it for some reason.  Hospitalist asked to admit for CP r/o.   Review of Systems: As per HPI, otherwise all review of systems negative.  Past Medical History:  Diagnosis Date  . Aortic atherosclerosis (HCC)   . Back pain   . Barrett esophagus   . Bleeding nose   . DDD (degenerative disc disease), thoracolumbar    Moderate to severe  . Diabetes mellitus without complication (HCC)   . Diverticulosis   . Esophageal cancer (HCC) dx'd 2017  . GERD (gastroesophageal reflux disease)   . Headache   . Hearing loss    Right ear  . History of umbilical hernia repair   . Idiopathic peripheral neuropathy   . Iron deficiency anemia   . LBBB (left bundle branch block) 02/11/2016  . Left hip pain    Severe degenerative changes  . Lipoma of neck    Right  . Mixed hyperlipidemia   . PONV (postoperative nausea and vomiting)   . Reflux   . Right thyroid nodule     Past Surgical  History:  Procedure Laterality Date  . BALLOON DILATION N/A 05/06/2018   Procedure: BALLOON DILATION;  Surgeon: Jeani Hawking, MD;  Location: [facility];  Service: Endoscopy;  Laterality: N/A;  . COLONOSCOPY    . ESOPHAGOGASTRODUODENOSCOPY (EGD) WITH PROPOFOL N/A 05/06/2018   Procedure: ESOPHAGOGASTRODUODENOSCOPY (EGD) WITH PROPOFOL;  Surgeon: Jeani Hawking, MD;  Location: [facility] ENDOSCOPY;  Service: Endoscopy;  Laterality: N/A;  . EUS N/A 12/05/2015   Procedure: UPPER ENDOSCOPIC ULTRASOUND (EUS) LINEAR;  Surgeon: Jeani Hawking, MD;  Location: [facility] ENDOSCOPY;  Service: Endoscopy;  Laterality: N/A;  . EUS N/A 03/10/2016   Procedure: UPPER ENDOSCOPIC ULTRASOUND (EUS) LINEAR;  Surgeon: Jeani Hawking, MD;  Location: [facility] ENDOSCOPY;  Service: Endoscopy;  Laterality: N/A;  . EUS N/A 01/07/2017   Procedure: UPPER ENDOSCOPIC ULTRASOUND (EUS) LINEAR;  Surgeon: Jeani Hawking, MD;  Location: [facility] ENDOSCOPY;  Service: Endoscopy;  Laterality: N/A;  . HERNIA REPAIR    . IR GENERIC HISTORICAL  12/24/2015   IR FLUORO GUIDE PORT INSERTION RIGHT 12/24/2015 Oley Balm, MD [facility]  . IR GENERIC HISTORICAL  12/24/2015   IR US GUIDE VASC ACCESS RIGHT 12/24/2015 Oley Balm, MD [facility]  . IR REMOVAL TUN ACCESS W/ PORT W/O FL MOD SED  02/07/2018  . PORTA CATH INSERTION    . SHOULDER ARTHROSCOPY W/ SUPERIOR LABRAL ANTERIOR  POSTERIOR LESION REPAIR     times 2  . TOTAL HIP ARTHROPLASTY Left 07/22/2017   Procedure: LEFT TOTAL HIP ARTHROPLASTY ANTERIOR APPROACH;  Surgeon: Samson Frederic, MD;  Location: [facility] ORS;  Service: Orthopedics;  Laterality: Left;  . UPPER GI ENDOSCOPY  01/07/2017     reports that he quit smoking about 20 years ago. He has a 80.00 pack-year smoking history. He has never used smokeless tobacco. He reports previous alcohol use. He reports that he does not use drugs.  No Known Allergies  Family History  Problem Relation Age of Onset  . Colon cancer Father 91  . Colon cancer Brother 12  . Heart  attack Brother   . Heart disease Mother   . Heart attack Mother   . Diabetes Sister   . Stroke Sister      Prior to Admission medications   Medication Sig Start Date End Date Taking? Authorizing Provider  albuterol (VENTOLIN HFA) 108 (90 Base) MCG/ACT inhaler Inhale 1-2 puffs into the lungs every 6 (six) hours as needed for wheezing or shortness of breath.  06/14/19   [provider]  amitriptyline (ELAVIL) 10 MG tablet Take 10 mg by mouth at bedtime. 03/11/18   [provider]  aspirin EC 81 MG tablet Take 81 mg by mouth daily.    [provider]  B Complex Vitamins (B COMPLEX 1 PO) Take 1 tablet by mouth daily.    [provider]  benzonatate (TESSALON) 100 MG capsule Take 1 capsule (100 mg total) by mouth 3 (three) times daily as needed for cough. 12/28/19   Sudie Grumbling, NP  celecoxib (CELEBREX) 200 MG capsule Take 200 mg by mouth daily. 04/17/19   [provider]  cetirizine (ZYRTEC ALLERGY) 10 MG tablet Take 1 tablet (10 mg total) by mouth daily. 11/25/19   Hall-Potvin, [place], PA-C  fluticasone (FLONASE) 50 MCG/ACT nasal spray Place 1 spray into both nostrils daily. 11/25/19   Hall-Potvin, [place], PA-C  Fluticasone-Salmeterol (ADVAIR DISKUS) 250-50 MCG/DOSE AEPB Inhale 1 puff into the lungs in the morning and at bedtime. 12/28/19   Sudie Grumbling, NP  gabapentin (NEURONTIN) 300 MG capsule Take 300 mg by mouth 2 (two) times daily.    [provider]  glimepiride (AMARYL) 1 MG tablet Take 1 mg by mouth daily after breakfast.  02/22/18   [provider]  LIPITOR 20 MG tablet Take 20 mg by mouth every evening. 02/21/18   [provider]  Multiple Vitamin (MULTIVITAMIN WITH MINERALS) TABS tablet Take 1 tablet by mouth daily.    [provider]  pantoprazole (PROTONIX) 40 MG tablet Take 40 mg by mouth daily. 02/21/18   [provider]  furosemide (LASIX) 20 MG tablet Take 1 tablet (20 mg total) by  mouth daily. Patient not taking: Reported on 07/16/2019 07/02/18 07/23/19  Terrilee Files, MD    Physical Exam: Vitals:   01/13/20 1715 01/13/20 1730 01/13/20 1830 01/13/20 1900  BP:   (!) 170/98 (!) 168/94  Pulse: (!) 49 (!) 55 (!) 58 (!) 52  Resp: 16 13 15 17   Temp:      TempSrc:      SpO2: 100% 95% 97% 98%  Weight:      Height:        Constitutional: NAD, calm, comfortable Eyes: PERRL, lids and conjunctivae normal ENMT: Mucous membranes are moist. Posterior pharynx clear of any exudate or lesions.Normal dentition.  Neck: normal, supple, no masses, no thyromegaly Respiratory: clear to  auscultation bilaterally, no wheezing, no crackles. Normal respiratory effort. No accessory muscle use.  Cardiovascular: Regular rate and rhythm, no murmurs / rubs / gallops. No extremity edema. 2+ pedal pulses. No carotid bruits.  Abdomen: no tenderness, no masses palpated. No hepatosplenomegaly. Bowel sounds positive.  Musculoskeletal: no clubbing / cyanosis. No joint deformity upper and lower extremities. Good ROM, no contractures. Normal muscle tone.  Skin: no rashes, lesions, ulcers. No induration Neurologic: CN 2-12 grossly intact. Sensation intact, DTR normal. Strength 5/5 in all 4.  Psychiatric: Normal judgment and insight. Alert and oriented x 3. Normal mood.    Labs on Admission: I have personally reviewed following labs and imaging studies  CBC: Recent Labs  Lab 01/09/20 1056 01/13/20 1105  WBC 4.2 2.8*  NEUTROABS 2.2  --   HGB 11.4* 11.2*  HCT 37.4* 37.5*  MCV 76.8* 77.0*  PLT 218 231   Basic Metabolic Panel: Recent Labs  Lab 01/09/20 1056 01/13/20 1105  NA 142 142  K 4.0 3.3*  CL 107 108  CO2 30 26  GLUCOSE 104* 123*  BUN 7* 9  CREATININE 0.99 1.00  CALCIUM 8.8* 8.5*   GFR: Estimated Creatinine Clearance: 67.6 mL/min (by C-G formula based on SCr of 1 mg/dL). Liver Function Tests: Recent Labs  Lab 01/09/20 1056  AST 23  ALT 16  ALKPHOS 69  BILITOT 0.5   PROT 6.8  ALBUMIN 3.7   No results for input(s): LIPASE, AMYLASE in the last 168 hours. No results for input(s): AMMONIA in the last 168 hours. Coagulation Profile: No results for input(s): INR, PROTIME in the last 168 hours. Cardiac Enzymes: No results for input(s): CKTOTAL, CKMB, CKMBINDEX, TROPONINI in the last 168 hours. BNP (last 3 results) No results for input(s): PROBNP in the last 8760 hours. HbA1C: No results for input(s): HGBA1C in the last 72 hours. CBG: No results for input(s): GLUCAP in the last 168 hours. Lipid Profile: No results for input(s): CHOL, HDL, LDLCALC, TRIG, CHOLHDL, LDLDIRECT in the last 72 hours. Thyroid Function Tests: No results for input(s): TSH, T4TOTAL, FREET4, T3FREE, THYROIDAB in the last 72 hours. Anemia Panel: No results for input(s): VITAMINB12, FOLATE, FERRITIN, TIBC, IRON, RETICCTPCT in the last 72 hours. Urine analysis:    Component Value Date/Time   COLORURINE STRAW (A) 07/15/2019 1959   APPEARANCEUR CLEAR 07/15/2019 1959   LABSPEC 1.006 07/15/2019 1959   PHURINE 7.0 07/15/2019 1959   GLUCOSEU NEGATIVE 07/15/2019 1959   HGBUR NEGATIVE 07/15/2019 1959   BILIRUBINUR NEGATIVE 07/15/2019 1959   KETONESUR NEGATIVE 07/15/2019 1959   PROTEINUR NEGATIVE 07/15/2019 1959   UROBILINOGEN 0.2 01/29/2016 1313   NITRITE NEGATIVE 07/15/2019 1959   LEUKOCYTESUR NEGATIVE 07/15/2019 1959    Radiological Exams on Admission: DG Chest 2 View  Result Date: 01/13/2020 CLINICAL DATA:  79 year old male with chest pain and shortness of breath. EXAM: CHEST - 2 VIEW COMPARISON:  None. FINDINGS: The heart size and mediastinal contours are within normal limits. Both lungs are clear. Multilevel degenerative changes of the visualized thoracic spine. No acute osseous abnormality. IMPRESSION: No active cardiopulmonary disease. Electronically Signed   By: Marliss Coots MD   On: 01/13/2020 12:36    EKG: Independently reviewed. Chronic  LBBB  Assessment/Plan Principal Problem:   Chest pain, rule out acute myocardial infarction Active Problems:   Esophageal cancer (HCC)   LBBB (left bundle branch block)   DM2 (diabetes mellitus, type 2) (HCC)   Elevated blood-pressure reading without diagnosis of hypertension    1. CP  r/o -  1. CP obs pathway 2. Serial trops 3. Tele monitor 4. PRN NTG 5. Restart Protonix 6. Ordered aspirin 325 x1 now, already on 81 daily looks like 7. Cont statin 8. Ordered morphine PRN 9. Cards eval in AM for CP 2. Esophageal CA - 1. Restart protonix - patient accidentally threw bottle of PPI away he says, probably needs re-script. 2. Checking CT chest, abd, pelvis as he is due for these anyhow in follow up. 3. DM2 - 1. Hold home PO meds 2. Sensitive SSI Q4H 4. Elevated BP reading - 1. PRN NTG for CP 2. PRN hydralazine also ordered for BP  DVT prophylaxis: Lovenox Code Status: Full Family Communication: No family in room Disposition Plan: Home after CP work up [employer] called: Message sent to [employer] for cards eval in AM Admission status: Place in [place]    Ulises Wolfinger, [place] M. DO [facility]  How to contact the [facility] Attending or Consulting provider 7A - 7P or covering provider during after hours 7P -7A, for this patient?  1. Check the care team in [facility] and look for a) attending/consulting TRH provider listed and b) the [facility] team listed 2. Log into [email or web address]  Amion Physician Scheduling and messaging for groups and whole hospitals  On call and physician scheduling software for group practices, residents, hospitalists and other medical providers for call, clinic, rotation and shift schedules. OnCall Enterprise is a hospital-wide system for scheduling doctors and paging doctors on call. EasyPlot is for scientific plotting and data analysis.  [email or web address]  and use [place]'s universal password to access. If you do not have the password, please contact the hospital operator.   3. Locate the [facility] provider you are looking for under [facility] and page to a number that you can be directly reached. 4. If you still have difficulty reaching the provider, please page the [facility] (Director on Call) for the Hospitalists listed on amion for assistance.  01/13/2020, 8:10 PM

## 2020-01-14 ENCOUNTER — Observation Stay (HOSPITAL_COMMUNITY): Payer: 59

## 2020-01-14 DIAGNOSIS — Z87891 Personal history of nicotine dependence: Secondary | ICD-10-CM | POA: Diagnosis not present

## 2020-01-14 DIAGNOSIS — R0602 Shortness of breath: Secondary | ICD-10-CM | POA: Diagnosis not present

## 2020-01-14 DIAGNOSIS — Z7951 Long term (current) use of inhaled steroids: Secondary | ICD-10-CM | POA: Diagnosis not present

## 2020-01-14 DIAGNOSIS — Z79899 Other long term (current) drug therapy: Secondary | ICD-10-CM | POA: Diagnosis not present

## 2020-01-14 DIAGNOSIS — E782 Mixed hyperlipidemia: Secondary | ICD-10-CM | POA: Diagnosis present

## 2020-01-14 DIAGNOSIS — E785 Hyperlipidemia, unspecified: Secondary | ICD-10-CM

## 2020-01-14 DIAGNOSIS — R079 Chest pain, unspecified: Secondary | ICD-10-CM

## 2020-01-14 DIAGNOSIS — Z9221 Personal history of antineoplastic chemotherapy: Secondary | ICD-10-CM | POA: Diagnosis not present

## 2020-01-14 DIAGNOSIS — E119 Type 2 diabetes mellitus without complications: Secondary | ICD-10-CM | POA: Diagnosis not present

## 2020-01-14 DIAGNOSIS — K2270 Barrett's esophagus without dysplasia: Secondary | ICD-10-CM | POA: Diagnosis present

## 2020-01-14 DIAGNOSIS — I700 Atherosclerosis of aorta: Secondary | ICD-10-CM | POA: Diagnosis not present

## 2020-01-14 DIAGNOSIS — I5041 Acute combined systolic (congestive) and diastolic (congestive) heart failure: Secondary | ICD-10-CM

## 2020-01-14 DIAGNOSIS — J439 Emphysema, unspecified: Secondary | ICD-10-CM | POA: Diagnosis not present

## 2020-01-14 DIAGNOSIS — I110 Hypertensive heart disease with heart failure: Secondary | ICD-10-CM | POA: Diagnosis present

## 2020-01-14 DIAGNOSIS — K5790 Diverticulosis of intestine, part unspecified, without perforation or abscess without bleeding: Secondary | ICD-10-CM | POA: Diagnosis present

## 2020-01-14 DIAGNOSIS — K219 Gastro-esophageal reflux disease without esophagitis: Secondary | ICD-10-CM | POA: Diagnosis present

## 2020-01-14 DIAGNOSIS — Z7982 Long term (current) use of aspirin: Secondary | ICD-10-CM | POA: Diagnosis not present

## 2020-01-14 DIAGNOSIS — E876 Hypokalemia: Secondary | ICD-10-CM | POA: Diagnosis present

## 2020-01-14 DIAGNOSIS — I2510 Atherosclerotic heart disease of native coronary artery without angina pectoris: Secondary | ICD-10-CM | POA: Diagnosis not present

## 2020-01-14 DIAGNOSIS — I340 Nonrheumatic mitral (valve) insufficiency: Secondary | ICD-10-CM

## 2020-01-14 DIAGNOSIS — R0789 Other chest pain: Secondary | ICD-10-CM | POA: Diagnosis present

## 2020-01-14 DIAGNOSIS — Z20822 Contact with and (suspected) exposure to covid-19: Secondary | ICD-10-CM | POA: Diagnosis present

## 2020-01-14 DIAGNOSIS — H9191 Unspecified hearing loss, right ear: Secondary | ICD-10-CM | POA: Diagnosis present

## 2020-01-14 DIAGNOSIS — I25110 Atherosclerotic heart disease of native coronary artery with unstable angina pectoris: Secondary | ICD-10-CM | POA: Diagnosis present

## 2020-01-14 DIAGNOSIS — I420 Dilated cardiomyopathy: Secondary | ICD-10-CM | POA: Diagnosis present

## 2020-01-14 DIAGNOSIS — J432 Centrilobular emphysema: Secondary | ICD-10-CM | POA: Diagnosis present

## 2020-01-14 DIAGNOSIS — G609 Hereditary and idiopathic neuropathy, unspecified: Secondary | ICD-10-CM | POA: Diagnosis present

## 2020-01-14 DIAGNOSIS — M5135 Other intervertebral disc degeneration, thoracolumbar region: Secondary | ICD-10-CM | POA: Diagnosis present

## 2020-01-14 DIAGNOSIS — I447 Left bundle-branch block, unspecified: Secondary | ICD-10-CM | POA: Diagnosis present

## 2020-01-14 DIAGNOSIS — Z8501 Personal history of malignant neoplasm of esophagus: Secondary | ICD-10-CM | POA: Diagnosis not present

## 2020-01-14 DIAGNOSIS — I428 Other cardiomyopathies: Secondary | ICD-10-CM | POA: Diagnosis present

## 2020-01-14 DIAGNOSIS — I5042 Chronic combined systolic (congestive) and diastolic (congestive) heart failure: Secondary | ICD-10-CM | POA: Diagnosis present

## 2020-01-14 DIAGNOSIS — I5021 Acute systolic (congestive) heart failure: Secondary | ICD-10-CM | POA: Diagnosis not present

## 2020-01-14 DIAGNOSIS — I10 Essential (primary) hypertension: Secondary | ICD-10-CM | POA: Diagnosis not present

## 2020-01-14 LAB — CBC
HCT: 40.6 % (ref 39.0–52.0)
Hemoglobin: 12.6 g/dL — ABNORMAL LOW (ref 13.0–17.0)
MCH: 23.2 pg — ABNORMAL LOW (ref 26.0–34.0)
MCHC: 31 g/dL (ref 30.0–36.0)
MCV: 74.6 fL — ABNORMAL LOW (ref 80.0–100.0)
Platelets: 244 10*3/uL (ref 150–400)
RBC: 5.44 MIL/uL (ref 4.22–5.81)
RDW: 15 % (ref 11.5–15.5)
WBC: 6.1 10*3/uL (ref 4.0–10.5)
nRBC: 0 % (ref 0.0–0.2)

## 2020-01-14 LAB — CBG MONITORING, ED: Glucose-Capillary: 90 mg/dL (ref 70–99)

## 2020-01-14 LAB — ECHOCARDIOGRAM COMPLETE
AR max vel: 2.35 cm2
AV Area VTI: 2.31 cm2
AV Area mean vel: 2.38 cm2
AV Mean grad: 3.7 mmHg
AV Peak grad: 7.6 mmHg
Ao pk vel: 1.38 m/s
Area-P 1/2: 2.76 cm2
Height: 69 in
S' Lateral: 4.2 cm
Weight: 3169.6 oz

## 2020-01-14 LAB — BASIC METABOLIC PANEL
Anion gap: 12 (ref 5–15)
BUN: 7 mg/dL — ABNORMAL LOW (ref 8–23)
CO2: 21 mmol/L — ABNORMAL LOW (ref 22–32)
Calcium: 8.9 mg/dL (ref 8.9–10.3)
Chloride: 105 mmol/L (ref 98–111)
Creatinine, Ser: 1 mg/dL (ref 0.61–1.24)
GFR, Estimated: >60 mL/min (ref >60)
Glucose, Bld: 164 mg/dL — ABNORMAL HIGH (ref 70–99)
Potassium: 3.5 mmol/L (ref 3.5–5.1)
Sodium: 138 mmol/L (ref 135–145)

## 2020-01-14 LAB — GLUCOSE, CAPILLARY
Glucose-Capillary: 102 mg/dL — ABNORMAL HIGH (ref 70–99)
Glucose-Capillary: 113 mg/dL — ABNORMAL HIGH (ref 70–99)
Glucose-Capillary: 115 mg/dL — ABNORMAL HIGH (ref 70–99)
Glucose-Capillary: 111 mg/dL — ABNORMAL HIGH (ref 70–99)

## 2020-01-14 MED ORDER — SODIUM CHLORIDE 0.9% FLUSH
3.0000 mL | Freq: Two times a day (BID) | INTRAVENOUS | Status: DC
  Administered 2020-01-14 – 2020-01-16 (×5): 3 mL via INTRAVENOUS

## 2020-01-14 MED ORDER — PERFLUTREN LIPID MICROSPHERE
1.0000 mL | INTRAVENOUS | Status: AC | PRN
  Administered 2020-01-14: 3 mL via INTRAVENOUS
  Filled 2020-01-14: qty 10

## 2020-01-14 MED ORDER — ATORVASTATIN CALCIUM 80 MG PO TABS
80.0000 mg | ORAL_TABLET | Freq: Every day | ORAL | Status: DC
  Administered 2020-01-15 – 2020-01-16 (×2): 80 mg via ORAL
  Filled 2020-01-14 (×2): qty 1

## 2020-01-14 MED ORDER — AMLODIPINE BESYLATE 5 MG PO TABS: 5.0000 mg | ORAL_TABLET | Freq: Every day | ORAL | Status: DC

## 2020-01-14 MED ORDER — POTASSIUM CHLORIDE CRYS ER 20 MEQ PO TBCR
60.0000 meq | EXTENDED_RELEASE_TABLET | Freq: Once | ORAL | Status: AC
  Administered 2020-01-14: 60 meq via ORAL
  Filled 2020-01-14: qty 3

## 2020-01-14 MED ORDER — ASPIRIN 81 MG PO CHEW
81.0000 mg | CHEWABLE_TABLET | ORAL | Status: AC
  Administered 2020-01-15: 81 mg via ORAL
  Filled 2020-01-14: qty 1

## 2020-01-14 MED ORDER — FUROSEMIDE 10 MG/ML IJ SOLN
40.0000 mg | Freq: Once | INTRAMUSCULAR | Status: AC
  Administered 2020-01-14: 40 mg via INTRAVENOUS
  Filled 2020-01-14: qty 4

## 2020-01-14 MED ORDER — SODIUM CHLORIDE 0.9% FLUSH: 3.0000 mL | INTRAVENOUS | Status: DC | PRN

## 2020-01-14 MED ORDER — ISOSORBIDE MONONITRATE ER 30 MG PO TB24
30.0000 mg | ORAL_TABLET | Freq: Every day | ORAL | Status: DC
  Administered 2020-01-14 – 2020-01-16 (×3): 30 mg via ORAL
  Filled 2020-01-14 (×3): qty 1

## 2020-01-14 MED ORDER — SODIUM CHLORIDE 0.9 % IV SOLN: INTRAVENOUS | Status: DC

## 2020-01-14 MED ORDER — SODIUM CHLORIDE 0.9 % IV SOLN: 250.0000 mL | INTRAVENOUS | Status: DC | PRN

## 2020-01-14 MED ORDER — HYDRALAZINE HCL 25 MG PO TABS
25.0000 mg | ORAL_TABLET | Freq: Three times a day (TID) | ORAL | Status: DC
  Administered 2020-01-14 – 2020-01-16 (×5): 25 mg via ORAL
  Filled 2020-01-14 (×6): qty 1

## 2020-01-14 NOTE — Progress Notes (Signed)
PROGRESS NOTE  Latron Ribas ZOX:[id number] DOB: 10-May-1940 DOA: 01/13/2020 PCP: [name], [name]), MD (Inactive)  Brief History   79 year old man presented with intermittent left chest pain, shortness of breath, swelling.  Admitted for further evaluation of chest pain. Found to have new systolic CHF. Plan for [facility] 10/18.   A & P  Acute systolic CHF, new dx, w/ regional wall motion abnormality, chronic left bundle branch block. LVEF 25% --Plan for right and left heart catheterization tomorrow as per cardiology --Continue hydralazine, Imdur.  Add AR and I after catheterization.  Beta-blocker not started given baseline bradycardia.  Consider spironolactone.  Diabetes mellitus type 2 --Stable.  Continue to hold oral home medications --Continue sliding scale insulin  PMH esophageal cancer in remission --Continue to follow-up as an outpatient  Emphysema by CT --appears stable, f/u as an outpt  Aortic atherosclerosis --f/u as an outpt  Disposition Plan:  Discussion: plan as above  Status is: Inpt  Dispo: The patient is from: Home              Anticipated d/c is to: Home              Anticipated d/c date is: 2 days              Patient currently is not medically stable to d/c.  DVT prophylaxis: enoxaparin (LOVENOX) injection 40 mg Start: 01/13/20 2100   Code Status: Full Code Family Communication: none  [name], MD  [facility] Direct contact: see [email or web address] (further directions at bottom of note if needed) 7PM-7AM contact night coverage as at bottom of note 01/14/2020, 5:20 PM  LOS: 0 days   Significant Hospital Events       Consults:   Cardiology    Procedures:     Significant Diagnostic Tests:      Micro Data:      Antimicrobials:     Interval History/Subjective  CC: f/u SOB  SOB now. No pain.   Objective   Vitals:  Vitals:   01/14/20 1118 01/14/20 1617  BP: (!) 179/97 96/76  Pulse: 64 61  Resp: (!) 21 20   Temp: 98.5 F (36.9 C) 98.4 F (36.9 C)  SpO2: 94% 97%    Exam:  Physical Exam Vitals reviewed.  Constitutional:      General: He is not in acute distress.    Appearance: He is well-developed. He is not ill-appearing or toxic-appearing.     Comments: Appears mildly short of breath  Cardiovascular:     Rate and Rhythm: Normal rate and regular rhythm.     Heart sounds: No murmur heard.  No friction rub. No gallop.   Pulmonary:     Effort: Pulmonary effort is normal.     Breath sounds: Normal breath sounds.  Neurological:     Mental Status: He is alert.  Psychiatric:        Mood and Affect: Mood normal.        Behavior: Behavior normal.      I have personally reviewed the following:   Today's Data   CBG stable  BMP unremarkable  CBC unremarkable  Scheduled Meds:  amitriptyline  10 mg Oral QHS   [START ON 01/15/2020] aspirin  81 mg Oral Pre-Cath   aspirin EC  81 mg Oral Daily   [START ON 01/15/2020] atorvastatin  80 mg Oral q1800   celecoxib  200 mg Oral Daily   enoxaparin (LOVENOX) injection  40 mg Subcutaneous Q24H   fluticasone furoate-vilanterol  1 puff Inhalation Daily   gabapentin  300 mg Oral BID   hydrALAZINE  25 mg Oral Q8H   insulin aspart  0-9 Units Subcutaneous Q4H   isosorbide mononitrate  30 mg Oral Daily   pantoprazole  40 mg Oral Daily   sodium chloride flush  3 mL Intravenous Q12H   Continuous Infusions:  sodium chloride     [START ON 01/15/2020] sodium chloride      Principal Problem:   Chest pain, rule out acute myocardial infarction Active Problems:   Esophageal cancer (HCC)   LBBB (left bundle branch block)   DM2 (diabetes mellitus, type 2) (HCC)   Elevated blood-pressure reading without diagnosis of hypertension   Acute systolic CHF (congestive heart failure) ([place])   LOS: 0 days   How to contact the [facility] Attending or Consulting provider 7A - 7P or covering provider during after hours [place], for this patient?   1. Check the care team in [facility] and look for a) attending/consulting TRH provider listed and b) the [facility] team listed 2. Log into [email or web address] and use [place]'s universal password to access. If you do not have the password, please contact the hospital operator. 3. Locate the [facility] provider you are looking for under [facility] and page to a number that you can be directly reached. 4. If you still have difficulty reaching the provider, please page the [facility] (Director on Call) for the Hospitalists listed on amion for assistance.

## 2020-01-14 NOTE — Consult Note (Addendum)
Cardiology Consultation:   Patient ID: Juan [name]; [id number]; [birth year]   Admit date: 01/13/2020 Date of Consult: 01/14/2020  Primary Care Provider: [name], [name]), MD (Inactive) Primary Cardiologist: Skeet Latch, MD Primary Electrophysiologist:  None  Patient Profile:   Juan [name] is a 79 y.o. male with a PMH of HLD, chronic LBBB, DM type 2, GERD, esophageal cancer s/p chemo/XRT in 2017 (currently in remission), and former tobacco abuse, who is being seen today for the evaluation of chest pain at the request of Dr. Alcario Drought.  History of Present Illness:   Juan [name] was in his usual state of health until a few weeks ago when he began experiencing intermittent left-sided chest discomfort. He noted a fullness sensation on the left side of his chest. No clear exertional component. On the morning of 01/13/20 he noted worsening SOB and nausea prompting him to present to the ED for further evaluation.   He was last evaluated by cardiology at an outpatient visit with Jory Sims, NP 06/2017 for preoperative evaluation for upcoming hip surgery. He was felt to be at an acceptable risk for upcoming surgery without further testing. He was recommended to follow-up a couple months after surgery, however was lost to follow-up. Prior CT scans have noted aortic atherosclerosis and coronary artery calcifications though he has not had any formal cardiac work-up.   At the time of this evaluation he is chest pain free. He is a bit of a poor historian, though sounds like he has had intermittent chest pain for the past 2 months. Not clearly exertional in nature. He describes a burning sensation with radiates up from left lower chest to left shoulder. Also with some progressive DOE. No memorable episodes of crushing chest pain. He notes some PND but no clear orthopnea. No complaints of LE edema or weight gain but thinks he may have some abdominal bloating as his pants have been a bit  tighter. He also has some intermittent palpitations. No recent viral infections. He reports family history of CHF/CAD in his mother who died in her 21s. Also with a brother who died from an MI in the late 50s/early 3s. He reports former tobacco and ETOH abuse, though quit 10+ years ago. He currently works in a frame shop making frames.   ED course: BP is persistently elevated this admission (no formal diagnosis of HTN), intermittently bradycardic to the 50s, intermittently tachypneic, satting in the 90s on 2L [place], afebrile. Labs notable for K 3.3, Cr 1.0, WBC 2.8, Hgb 11.2, PLT 231, Ddimer negative, CRP wnl, HsTrop 22>25>21. EKG with sinus bradycardia with rate 56 bpm, chronic LBBB. CXR without acute findings. CT C/A/P showed mild pulmonary edema, bilateral bronchial wall thickening c/w infection vs inflammation, and aortic atherosclerosis/coronary artery calcifications. He was given ASA 325mg  and IV fentanyl x1 dose in the ED. Admitted to medicine. Home medications continued. Cardiology asked to evaluate.   Past Medical History:  Diagnosis Date  . Aortic atherosclerosis ([place])   . Back pain   . Barrett esophagus   . Bleeding nose   . DDD (degenerative disc disease), thoracolumbar    Moderate to severe  . Diabetes mellitus without complication ([place])   . Diverticulosis   . Esophageal cancer ([place]) dx'd 2017  . GERD (gastroesophageal reflux disease)   . Headache   . Hearing loss    Right ear  . History of umbilical hernia repair   . Idiopathic peripheral neuropathy   . Iron deficiency anemia   . LBBB (  left bundle branch block) 02/11/2016  . Left hip pain    Severe degenerative changes  . Lipoma of neck    Right  . Mixed hyperlipidemia   . PONV (postoperative nausea and vomiting)   . Reflux   . Right thyroid nodule     Past Surgical History:  Procedure Laterality Date  . BALLOON DILATION N/A 05/06/2018   Procedure: BALLOON DILATION;  Surgeon: Carol Ada, MD;  Location: [facility];   Service: Endoscopy;  Laterality: N/A;  . COLONOSCOPY    . ESOPHAGOGASTRODUODENOSCOPY (EGD) WITH PROPOFOL N/A 05/06/2018   Procedure: ESOPHAGOGASTRODUODENOSCOPY (EGD) WITH PROPOFOL;  Surgeon: Carol Ada, MD;  Location: [facility];  Service: Endoscopy;  Laterality: N/A;  . EUS N/A 12/05/2015   Procedure: UPPER ENDOSCOPIC ULTRASOUND (EUS) LINEAR;  Surgeon: Carol Ada, MD;  Location: [facility];  Service: Endoscopy;  Laterality: N/A;  . EUS N/A 03/10/2016   Procedure: UPPER ENDOSCOPIC ULTRASOUND (EUS) LINEAR;  Surgeon: Carol Ada, MD;  Location: [facility];  Service: Endoscopy;  Laterality: N/A;  . EUS N/A 01/07/2017   Procedure: UPPER ENDOSCOPIC ULTRASOUND (EUS) LINEAR;  Surgeon: Carol Ada, MD;  Location: [facility];  Service: Endoscopy;  Laterality: N/A;  . HERNIA REPAIR    . IR GENERIC HISTORICAL  12/24/2015   IR FLUORO GUIDE PORT INSERTION RIGHT 12/24/2015 Arne Cleveland, MD [facility]  . IR GENERIC HISTORICAL  12/24/2015   IR US GUIDE VASC ACCESS RIGHT 12/24/2015 Arne Cleveland, MD [facility]  . IR REMOVAL TUN ACCESS W/ PORT W/O FL MOD SED  02/07/2018  . PORTA CATH INSERTION    . SHOULDER ARTHROSCOPY W/ SUPERIOR LABRAL ANTERIOR POSTERIOR LESION REPAIR     times 2  . TOTAL HIP ARTHROPLASTY Left 07/22/2017   Procedure: LEFT TOTAL HIP ARTHROPLASTY ANTERIOR APPROACH;  Surgeon: Rod Can, MD;  Location: [facility];  Service: Orthopedics;  Laterality: Left;  . UPPER GI ENDOSCOPY  01/07/2017     Home Medications:  Prior to Admission medications   Medication Sig Start Date End Date Taking? Authorizing Provider  albuterol (VENTOLIN HFA) 108 (90 Base) MCG/ACT inhaler Inhale 2 puffs into the lungs every 6 (six) hours as needed for wheezing or shortness of breath.  06/14/19  Yes [provider]  amitriptyline (ELAVIL) 10 MG tablet Take 10 mg by mouth at bedtime. 03/11/18  Yes [provider]  aspirin EC 81 MG tablet Take 81 mg by mouth daily.   Yes [provider]  atorvastatin (LIPITOR) 20 MG tablet Take 20 mg by mouth daily.   Yes [provider]  benzonatate (TESSALON) 100 MG capsule Take 1 capsule (100 mg total) by mouth 3 (three) times daily as needed for cough. 12/28/19  Yes Amyot, Nicholes Stairs, NP  celecoxib (CELEBREX) 200 MG capsule Take 200 mg by mouth daily. 04/17/19  Yes [provider]  cetirizine (ZYRTEC ALLERGY) 10 MG tablet Take 1 tablet (10 mg total) by mouth daily. Patient taking differently: Take 10 mg by mouth daily as needed for allergies or rhinitis.  11/25/19  Yes Hall-Potvin, [place], PA-C  fluticasone (FLONASE) 50 MCG/ACT nasal spray Place 1 spray into both nostrils daily. Patient taking differently: Place 1 spray into both nostrils daily as needed for allergies or rhinitis.  11/25/19  Yes Hall-Potvin, [place], PA-C  gabapentin (NEURONTIN) 300 MG capsule Take 300 mg by mouth in the morning and at bedtime.    Yes [provider]  Multiple Vitamins-Minerals (CENTRUM SILVER 50+MEN) TABS Take 1 tablet by mouth daily with  breakfast.   Yes [provider]  oxyCODONE-acetaminophen (PERCOCET) 10-325 MG tablet Take 1 tablet by mouth See admin instructions. Take 1 tablet by mouth two to three times a day as needed for pain 12/25/19  Yes [provider]  pantoprazole (PROTONIX) 40 MG tablet Take 40 mg by mouth daily before breakfast.  02/21/18  Yes [provider]  Grant Ruts INHUB 250-50 MCG/DOSE AEPB Inhale 1 puff into the lungs daily.   Yes [provider]  glimepiride (AMARYL) 1 MG tablet Take 1 mg by mouth daily after breakfast.  02/22/18   [provider]  furosemide (LASIX) 20 MG tablet Take 1 tablet (20 mg total) by mouth daily. Patient not taking: Reported on 07/16/2019 07/02/18 07/23/19  [name], MD    Inpatient Medications: Scheduled Meds: . amitriptyline  10 mg Oral QHS  . amLODipine  5 mg Oral Daily  . aspirin EC  81 mg Oral Daily  . atorvastatin   20 mg Oral Daily  . celecoxib  200 mg Oral Daily  . enoxaparin (LOVENOX) injection  40 mg Subcutaneous Q24H  . fluticasone furoate-vilanterol  1 puff Inhalation Daily  . gabapentin  300 mg Oral BID  . insulin aspart  0-9 Units Subcutaneous Q4H  . pantoprazole  40 mg Oral Daily   Continuous Infusions:  PRN Meds: acetaminophen, albuterol, fluticasone, hydrALAZINE, morphine injection, nitroGLYCERIN, ondansetron (ZOFRAN) IV  Allergies:   No Known Allergies  Social History:   Social History   Socioeconomic History  . Marital status: Legally Separated    Spouse name: Not on file  . Number of children: Not on file  . Years of education: Not on file  . Highest education level: Not on file  Occupational History  . Not on file  Tobacco Use  . Smoking status: Former Smoker    Packs/day: 2.00    Years: 40.00    Pack years: 80.00    Quit date: 03/31/1999    Years since quitting: 20.8  . Smokeless tobacco: Never Used  Vaping Use  . Vaping Use: Never used  Substance and Sexual Activity  . Alcohol use: Not Currently  . Drug use: No  . Sexual activity: Not on file  Other Topics Concern  . Not on file  Social History Narrative   Married, wife [name]   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 and 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   Investment banker, operational of Radio broadcast assistant Strain:   . Difficulty of Paying Living Expenses: Not on file  Food Insecurity:   . Worried About Charity fundraiser in the Last Year: Not on file  . Ran Out of Food in the Last Year: Not on file  Transportation Needs:   . Lack of Transportation (Medical): Not on file  . Lack of Transportation (Non-Medical): Not on file  Physical Activity:   . Days of Exercise per Week: Not on file  . Minutes of Exercise per Session: Not on file  Stress:   . Feeling of Stress : Not on file  Social Connections:   . Frequency of Communication with Friends and Family: Not on file  . Frequency of Social Gatherings with Friends and  Family: Not on file  . Attends Religious Services: Not on file  . Active Member of Clubs or Organizations: Not on file  . Attends Archivist Meetings: Not on file  . Marital Status: Not on file  Intimate Partner Violence:   . Fear of Current or Ex-Partner: Not  on file  . Emotionally Abused: Not on file  . Physically Abused: Not on file  . Sexually Abused: Not on file    Family History:    Family History  Problem Relation Age of Onset  . Colon cancer Father 71  . Colon cancer Brother 3  . Heart attack Brother   . Heart disease Mother   . Heart attack Mother   . Diabetes Sister   . Stroke Sister      ROS:  Please see the history of present illness.  ROS  All other ROS reviewed and negative.     Physical Exam/Data:   Vitals:   01/14/20 0515 01/14/20 0530 01/14/20 0637 01/14/20 0727  BP:  (!) 172/109 (!) 162/99 (!) 161/97  Pulse: 61 76 (!) 58 76  Resp: (!) 21 17 20 17   Temp:   97.6 F (36.4 C) 97.8 F (36.6 C)  TempSrc:   Oral Oral  SpO2: 96% 97% 92% 96%  Weight:   89.9 kg   Height:   5\' 9"  (1.753 m)     Intake/Output Summary (Last 24 hours) at 01/14/2020 0940 Last data filed at 01/14/2020 0843 Gross per 24 hour  Intake 170 ml  Output 350 ml  Net -180 ml   Filed Weights   01/13/20 1053 01/14/20 0637  Weight: 93.4 kg 89.9 kg   Body mass index is 29.25 kg/m.  General:  Well nourished, well developed, in no acute distress HEENT: sclera anicteric  Neck: no JVD Vascular: No carotid bruits; distal pulses 2+ bilaterally Cardiac:  normal S1, S2; RRR; no murmurs, rubs, or gallops Lungs:  clear to auscultation bilaterally, no wheezing, rhonchi or rales  Abd: NABS, soft, nontender, no hepatomegaly Ext: no edema Musculoskeletal:  No deformities, BUE and BLE strength normal and equal Skin: warm and dry  Neuro:  CNs 2-12 intact, no focal abnormalities noted Psych:  Normal affect   EKG:  The EKG was personally reviewed and demonstrates:  Sinus  bradycardia with rate 56 bpm, chronic LBBB Telemetry:  Telemetry was personally reviewed and demonstrates:  Sinus rhythm/sinus bradycardia with intermittent LBBB  Relevant CV Studies: Echo pending  Laboratory Data:  Chemistry Recent Labs  Lab 01/09/20 1056 01/13/20 1105  NA 142 142  K 4.0 3.3*  CL 107 108  CO2 30 26  GLUCOSE 104* 123*  BUN 7* 9  CREATININE 0.99 1.00  CALCIUM 8.8* 8.5*  GFRNONAA >60 >60  ANIONGAP 5 8    Recent Labs  Lab 01/09/20 1056  PROT 6.8  ALBUMIN 3.7  AST 23  ALT 16  ALKPHOS 69  BILITOT 0.5   Hematology Recent Labs  Lab 01/09/20 1056 01/13/20 1105  WBC 4.2 2.8*  RBC 4.87 4.87  HGB 11.4* 11.2*  HCT 37.4* 37.5*  MCV 76.8* 77.0*  MCH 23.4* 23.0*  MCHC 30.5 29.9*  RDW 15.9* 15.6*  PLT 218 231   Cardiac EnzymesNo results for input(s): TROPONINI in the last 168 hours. No results for input(s): TROPIPOC in the last 168 hours.  BNPNo results for input(s): BNP, PROBNP in the last 168 hours.  DDimer  Recent Labs  Lab 01/13/20 1540  DDIMER 0.42    Radiology/Studies:  DG Chest 2 View  Result Date: 01/13/2020 CLINICAL DATA:  79 year old male with chest pain and shortness of breath. EXAM: CHEST - 2 VIEW COMPARISON:  None. FINDINGS: The heart size and mediastinal contours are within normal limits. Both lungs are clear. Multilevel degenerative changes of the visualized thoracic spine. No  acute osseous abnormality. IMPRESSION: No active cardiopulmonary disease. Electronically Signed   By: Ruthann Cancer MD   On: 01/13/2020 12:36   CT Chest W Contrast  Result Date: 01/13/2020 CLINICAL DATA:  Left-sided chest pain, shortness of breath, nausea, history of esophageal cancer EXAM: CT CHEST, ABDOMEN, AND PELVIS WITH CONTRAST TECHNIQUE: Multidetector CT imaging of the chest, abdomen and pelvis was performed following the standard protocol during bolus administration of intravenous contrast. CONTRAST:  153mL OMNIPAQUE IOHEXOL 300 MG/ML  SOLN COMPARISON:   07/15/2019 FINDINGS: CT CHEST FINDINGS Cardiovascular: Aortic atherosclerosis. Normal heart size. Left coronary artery calcifications. No pericardial effusion. Mediastinum/Nodes: Prominent mediastinal lymph nodes, unchanged compared to prior examination. Thyroid gland, trachea, and esophagus demonstrate no significant findings. Lungs/Pleura: Mild centrilobular emphysema. Diffuse bilateral bronchial wall thickening. Mild interlobular septal thickening and trace bilateral pleural effusions. No pleural effusion or pneumothorax. Musculoskeletal: No chest wall mass or suspicious bone lesions identified. CT ABDOMEN PELVIS FINDINGS Hepatobiliary: No solid liver abnormality is seen. No gallstones, gallbladder wall thickening, or biliary dilatation. Pancreas: Unremarkable. No pancreatic ductal dilatation or surrounding inflammatory changes. Spleen: Normal in size without significant abnormality. Adrenals/Urinary Tract: Adrenal glands are unremarkable. Small bilateral nonobstructive renal calculi. No hydronephrosis. Bladder is unremarkable. Stomach/Bowel: Stomach is within normal limits. Appendix appears normal. No evidence of bowel wall thickening, distention, or inflammatory changes. Vascular/Lymphatic: No significant vascular findings are present. No enlarged abdominal or pelvic lymph nodes. Reproductive: No mass or other abnormality. Other: No abdominal wall hernia or abnormality. No abdominopelvic ascites. Musculoskeletal: No acute or significant osseous findings. Left hip total arthroplasty. IMPRESSION: 1. Mild interlobular septal thickening and trace bilateral pleural effusions, consistent with pulmonary edema. 2. Mild centrilobular emphysema. Emphysema (ICD10-J43.9). 3. Diffuse bilateral bronchial wall thickening, consistent with nonspecific infectious or inflammatory bronchitis. 4. No evidence of esophageal mass or other specific evidence of malignancy on this examination. 5. Prominent mediastinal lymph nodes,  unchanged compared to prior examination, nonspecific. 6. Coronary artery disease. Aortic Atherosclerosis (ICD10-I70.0). 7. Nonobstructive bilateral nephrolithiasis. Electronically Signed   By: Eddie Candle M.D.   On: 01/13/2020 20:54   CT Abdomen Pelvis W Contrast  Result Date: 01/13/2020 CLINICAL DATA:  Left-sided chest pain, shortness of breath, nausea, history of esophageal cancer EXAM: CT CHEST, ABDOMEN, AND PELVIS WITH CONTRAST TECHNIQUE: Multidetector CT imaging of the chest, abdomen and pelvis was performed following the standard protocol during bolus administration of intravenous contrast. CONTRAST:  164mL OMNIPAQUE IOHEXOL 300 MG/ML  SOLN COMPARISON:  07/15/2019 FINDINGS: CT CHEST FINDINGS Cardiovascular: Aortic atherosclerosis. Normal heart size. Left coronary artery calcifications. No pericardial effusion. Mediastinum/Nodes: Prominent mediastinal lymph nodes, unchanged compared to prior examination. Thyroid gland, trachea, and esophagus demonstrate no significant findings. Lungs/Pleura: Mild centrilobular emphysema. Diffuse bilateral bronchial wall thickening. Mild interlobular septal thickening and trace bilateral pleural effusions. No pleural effusion or pneumothorax. Musculoskeletal: No chest wall mass or suspicious bone lesions identified. CT ABDOMEN PELVIS FINDINGS Hepatobiliary: No solid liver abnormality is seen. No gallstones, gallbladder wall thickening, or biliary dilatation. Pancreas: Unremarkable. No pancreatic ductal dilatation or surrounding inflammatory changes. Spleen: Normal in size without significant abnormality. Adrenals/Urinary Tract: Adrenal glands are unremarkable. Small bilateral nonobstructive renal calculi. No hydronephrosis. Bladder is unremarkable. Stomach/Bowel: Stomach is within normal limits. Appendix appears normal. No evidence of bowel wall thickening, distention, or inflammatory changes. Vascular/Lymphatic: No significant vascular findings are present. No enlarged  abdominal or pelvic lymph nodes. Reproductive: No mass or other abnormality. Other: No abdominal wall hernia or abnormality. No abdominopelvic ascites. Musculoskeletal: No acute or significant osseous  findings. Left hip total arthroplasty. IMPRESSION: 1. Mild interlobular septal thickening and trace bilateral pleural effusions, consistent with pulmonary edema. 2. Mild centrilobular emphysema. Emphysema (ICD10-J43.9). 3. Diffuse bilateral bronchial wall thickening, consistent with nonspecific infectious or inflammatory bronchitis. 4. No evidence of esophageal mass or other specific evidence of malignancy on this examination. 5. Prominent mediastinal lymph nodes, unchanged compared to prior examination, nonspecific. 6. Coronary artery disease. Aortic Atherosclerosis (ICD10-I70.0). 7. Nonobstructive bilateral nephrolithiasis. Electronically Signed   By: Eddie Candle M.D.   On: 01/13/2020 20:54    Assessment and Plan:   1. Chest pain in patient with coronary artery calcifications on CT: patient presented with intermittent non-exertional chest pain with associated SOB. EKG with chronic LBBB. HsTrop with low flat trend in the 20s, not c/w ACS. CT C/A/P showed possible infectious vs inflammatory process with pulmonary edema and aortic atherosclerosis/coronary artery calcifications. No prior cardiac testing. Risk factors for CAD include HTN (BP elevated this admission though no formal diagnosis of HTN in the past), HLD, DM type 2, family history of CAD, former tobacco use, and prior XRT to chest. He is currently chest pain free. Prelim read on echo suggestive of LV systolic dysfunction with RWMA.  - Await formal echocardiogram read - Plan for R/L heart catheterization tomorrow to evaluate coronary artery anatomy. The patient understands that risks included but are not limited to stroke (1 in 1000), death (1 in 57), kidney failure [usually temporary] (1 in 500), bleeding (1 in 200), allergic reaction [possibly  serious] (1 in 200).  The patient understands and agrees to proceed.  - Continue aspirin 81mg  daily - Continue statin - Will check FLP and A1C for risk stratification  2. Acute combined CHF: prelim read on echo suggestive of LV systolic dysfunction. CXR without acute findings. CT Chest suggested some mild pulmonary edema. Lungs are clear and no LE edema on exam though still with some SOB. - Will give IV lasix 40mg  x1 today - Await formal echo read  - Plan for cardiac cath as above - Will start hydralazine/imdur - Anticipate addition of entresto following cath if Cr is stable - Unable to add BBlocker due to baseline bradycardia  3. HTN: new diagnosis this admission. BP has been persistently 140-180s/80s-110s. Possible this has contributed to #2.  - Will start hydralazine/imdur - Anticipate addition of entresto following cath if Cr is stable  4. HLD: no lipids on file. Home atorvastatin 20mg  continued - Will check FLP in AM - Will increase atorvastatin to 80mg  daily   5. DM type 2: A1C pending, no prior on file - Continue management per primary team - Would consider addition of SGLT2-I in light of #2  6. Hypokalemia: K 3.3 yesterday morning. Does not look like this was repleted.  - Will repeat BMET this AM. Replete K to maintain >4 - Will give po potassium 60 mEq in anticipation of IV lasix today  7. GERD in patient with history of esophageal cancer: he has been off his protonix for a while due to insurance issues. Possible this is contributing to #1. CT C/A/P not suggestive of recurrent cancer.  - Continue mangement per primary team.    For questions or updates, please contact [facility] Please consult [email or web address] for contact info under Cardiology/STEMI.   Signed, Abigail Butts, PA-C  01/14/2020 9:40 AM [phone]  ---------------------------------------------------------------------------------------------   History and all data above reviewed.  Patient  examined.  I agree with the findings as above.  Juan [name] is a pleasant 80 year old  gentleman with several weeks of chest discomfort, shortness of breath, and PND.  Echocardiogram performed this admission demonstrates reduced ejection fraction.  We do not have a prior assessment of cardiovascular function.  He has not previously had an ischemic evaluation.  CT of the chest this admission suggests mild coronary artery calcifications.  There are however regional wall motion abnormalities on his echocardiogram.  He provides a brief history but notes fullness and discomfort on the left side of the chest at rest, and limiting shortness of breath with nausea.  Was last seen in cardiology office 2 years ago for preoperative evaluation.  Constitutional: No acute distress Cardiovascular: regular rhythm, normal rate, no murmurs. S1 and S2 normal. No jugular venous distention.  Respiratory: Bibasilar crackles, faint GI : normal bowel sounds, soft and nontender. No distention.   MSK: extremities warm, well perfused. No edema.  NEURO: grossly nonfocal exam, moves all extremities. PSYCH: alert and oriented x 3, normal mood and affect.   All available labs, radiology testing, previous records reviewed. Agree with documented assessment and plan of my colleague as stated above with the following additions or changes:  Principal Problem:   Chest pain, rule out acute myocardial infarction Active Problems:   Esophageal cancer ([facility])   LBBB (left bundle branch block)   DM2 (diabetes mellitus, type 2) ([facility])   Elevated blood-pressure reading without diagnosis of hypertension    Plan:  Acute systolic heart failure Regional wall motion abnormality on echo -He is known to have a left bundle branch block, but on my evaluation of the echocardiogram it appears there are also regional wall motion abnormalities suggesting possible ischemic etiology.  - plan for [facility] tomorrow for further information. He is SOB but exam  is equivocal for volume overload.Normal appearing right heart size and function on echo. Will administer one dose of lasix and monitor symptoms and output.  - Hydralazine imdur can be started for HF and HTN, will add ARNI after cath. Baseline bradycardia limits use of BB. Will consider spironolactone as well.   Length of Stay:  LOS: 0 days   Elouise Munroe, MD [facility] 12:21 PM  01/14/2020

## 2020-01-14 NOTE — ED Notes (Signed)
Breakfast ordered 

## 2020-01-14 NOTE — Hospital Course (Signed)
79 year old man presented with intermittent left chest pain, shortness of breath, swelling.  Admitted for further evaluation of chest pain. Found to have new systolic CHF. Plan for [facility] 10/18.

## 2020-01-14 NOTE — Plan of Care (Signed)

## 2020-01-14 NOTE — Plan of Care (Signed)

## 2020-01-14 NOTE — Progress Notes (Signed)
  Echocardiogram 2D Echocardiogram has been performed.  Juan [name] 01/14/2020, 10:11 AM

## 2020-01-15 ENCOUNTER — Encounter (HOSPITAL_COMMUNITY)
Admission: EM | Disposition: A | Discharge status: Home / Self Care | Payer: Self-pay | Source: Home / Self Care | Attending: Family Medicine

## 2020-01-15 DIAGNOSIS — I2510 Atherosclerotic heart disease of native coronary artery without angina pectoris: Secondary | ICD-10-CM

## 2020-01-15 DIAGNOSIS — R079 Chest pain, unspecified: Secondary | ICD-10-CM | POA: Diagnosis not present

## 2020-01-15 DIAGNOSIS — I10 Essential (primary) hypertension: Secondary | ICD-10-CM | POA: Diagnosis not present

## 2020-01-15 DIAGNOSIS — I5021 Acute systolic (congestive) heart failure: Secondary | ICD-10-CM | POA: Diagnosis not present

## 2020-01-15 DIAGNOSIS — J439 Emphysema, unspecified: Secondary | ICD-10-CM | POA: Diagnosis not present

## 2020-01-15 DIAGNOSIS — I200 Unstable angina: Secondary | ICD-10-CM

## 2020-01-15 DIAGNOSIS — E119 Type 2 diabetes mellitus without complications: Secondary | ICD-10-CM | POA: Diagnosis not present

## 2020-01-15 HISTORY — PX: RIGHT/LEFT HEART CATH AND CORONARY ANGIOGRAPHY: CATH118266

## 2020-01-15 LAB — POCT I-STAT EG7
Acid-Base Excess: 2 mmol/L (ref 0.0–2.0)
Bicarbonate: 26.8 mmol/L (ref 20.0–28.0)
Calcium, Ion: 1.18 mmol/L (ref 1.15–1.40)
HCT: 37 % — ABNORMAL LOW (ref 39.0–52.0)
Hemoglobin: 12.6 g/dL — ABNORMAL LOW (ref 13.0–17.0)
O2 Saturation: 58 %
Potassium: 3.5 mmol/L (ref 3.5–5.1)
Sodium: 141 mmol/L (ref 135–145)
TCO2: 28 mmol/L (ref 22–32)
pCO2, Ven: 40.6 mmHg — ABNORMAL LOW (ref 44.0–60.0)
pH, Ven: 7.429 (ref 7.250–7.430)
pO2, Ven: 29 mmHg — CL (ref 32.0–45.0)

## 2020-01-15 LAB — HEMOGLOBIN A1C
Hgb A1c MFr Bld: 6.3 % — ABNORMAL HIGH (ref 4.8–5.6)
Mean Plasma Glucose: 134 mg/dL

## 2020-01-15 LAB — GLUCOSE, CAPILLARY
Glucose-Capillary: 112 mg/dL — ABNORMAL HIGH (ref 70–99)
Glucose-Capillary: 121 mg/dL — ABNORMAL HIGH (ref 70–99)
Glucose-Capillary: 124 mg/dL — ABNORMAL HIGH (ref 70–99)
Glucose-Capillary: 125 mg/dL — ABNORMAL HIGH (ref 70–99)
Glucose-Capillary: 89 mg/dL (ref 70–99)
Glucose-Capillary: 97 mg/dL (ref 70–99)

## 2020-01-15 LAB — LIPID PANEL
Cholesterol: 173 mg/dL (ref 0–200)
HDL: 52 mg/dL (ref >40)
LDL Cholesterol: 103 mg/dL — ABNORMAL HIGH (ref 0–99)
Total CHOL/HDL Ratio: 3.3 RATIO
Triglycerides: 89 mg/dL (ref <150)
VLDL: 18 mg/dL (ref 0–40)

## 2020-01-15 LAB — POCT I-STAT 7, (LYTES, BLD GAS, ICA,H+H)
Acid-Base Excess: 1 mmol/L (ref 0.0–2.0)
Bicarbonate: 24.6 mmol/L (ref 20.0–28.0)
Calcium, Ion: 1.16 mmol/L (ref 1.15–1.40)
HCT: 37 % — ABNORMAL LOW (ref 39.0–52.0)
Hemoglobin: 12.6 g/dL — ABNORMAL LOW (ref 13.0–17.0)
O2 Saturation: 98 %
Potassium: 3.5 mmol/L (ref 3.5–5.1)
Sodium: 140 mmol/L (ref 135–145)
TCO2: 26 mmol/L (ref 22–32)
pCO2 arterial: 35.4 mmHg (ref 32.0–48.0)
pH, Arterial: 7.45 (ref 7.350–7.450)
pO2, Arterial: 107 mmHg (ref 83.0–108.0)

## 2020-01-15 SURGERY — RIGHT/LEFT HEART CATH AND CORONARY ANGIOGRAPHY
Anesthesia: LOCAL

## 2020-01-15 MED ORDER — VERAPAMIL HCL 2.5 MG/ML IV SOLN
INTRAVENOUS | Status: AC
  Filled 2020-01-15: qty 2

## 2020-01-15 MED ORDER — IOHEXOL 350 MG/ML SOLN
INTRAVENOUS | Status: DC | PRN
  Administered 2020-01-15: 30 mL

## 2020-01-15 MED ORDER — LIDOCAINE HCL (PF) 1 % IJ SOLN
INTRAMUSCULAR | Status: AC
  Filled 2020-01-15: qty 30

## 2020-01-15 MED ORDER — HEPARIN (PORCINE) IN NACL 1000-0.9 UT/500ML-% IV SOLN
INTRAVENOUS | Status: AC
  Filled 2020-01-15: qty 1000

## 2020-01-15 MED ORDER — MIDAZOLAM HCL 2 MG/2ML IJ SOLN
INTRAMUSCULAR | Status: AC
  Filled 2020-01-15: qty 2

## 2020-01-15 MED ORDER — MIDAZOLAM HCL 2 MG/2ML IJ SOLN
INTRAMUSCULAR | Status: DC | PRN
  Administered 2020-01-15: 0.5 mg via INTRAVENOUS

## 2020-01-15 MED ORDER — LIDOCAINE HCL (PF) 1 % IJ SOLN
INTRAMUSCULAR | Status: DC | PRN
  Administered 2020-01-15 (×2): 1 mL

## 2020-01-15 MED ORDER — HEPARIN SODIUM (PORCINE) 1000 UNIT/ML IJ SOLN
INTRAMUSCULAR | Status: DC | PRN
  Administered 2020-01-15: 4500 [IU] via INTRAVENOUS

## 2020-01-15 MED ORDER — FENTANYL CITRATE (PF) 100 MCG/2ML IJ SOLN
INTRAMUSCULAR | Status: AC
  Filled 2020-01-15: qty 2

## 2020-01-15 MED ORDER — HEPARIN (PORCINE) IN NACL 1000-0.9 UT/500ML-% IV SOLN
INTRAVENOUS | Status: DC | PRN
  Administered 2020-01-15: 500 mL

## 2020-01-15 MED ORDER — SODIUM CHLORIDE 0.9 % IV SOLN: INTRAVENOUS | Status: AC

## 2020-01-15 MED ORDER — SODIUM CHLORIDE 0.9% FLUSH: 3.0000 mL | INTRAVENOUS | Status: DC | PRN

## 2020-01-15 MED ORDER — FENTANYL CITRATE (PF) 100 MCG/2ML IJ SOLN
INTRAMUSCULAR | Status: DC | PRN
  Administered 2020-01-15: 12.5 ug via INTRAVENOUS

## 2020-01-15 MED ORDER — VERAPAMIL HCL 2.5 MG/ML IV SOLN
INTRAVENOUS | Status: DC | PRN
  Administered 2020-01-15: 10 mL via INTRA_ARTERIAL

## 2020-01-15 MED ORDER — HEPARIN SODIUM (PORCINE) 1000 UNIT/ML IJ SOLN
INTRAMUSCULAR | Status: AC
  Filled 2020-01-15: qty 1

## 2020-01-15 MED ORDER — ENOXAPARIN SODIUM 40 MG/0.4ML ~~LOC~~ SOLN
40.0000 mg | SUBCUTANEOUS | Status: DC
  Administered 2020-01-16 – 2020-01-17 (×2): 40 mg via SUBCUTANEOUS
  Filled 2020-01-15 (×2): qty 0.4

## 2020-01-15 MED ORDER — HYDRALAZINE HCL 20 MG/ML IJ SOLN: 10.0000 mg | INTRAMUSCULAR | Status: AC | PRN

## 2020-01-15 MED ORDER — SODIUM CHLORIDE 0.9 % IV SOLN: 250.0000 mL | INTRAVENOUS | Status: DC | PRN

## 2020-01-15 MED ORDER — SODIUM CHLORIDE 0.9% FLUSH
3.0000 mL | Freq: Two times a day (BID) | INTRAVENOUS | Status: DC
  Administered 2020-01-16 – 2020-01-17 (×2): 3 mL via INTRAVENOUS

## 2020-01-15 SURGICAL SUPPLY — 12 items
CATH 5FR JL3.5 JR4 ANG PIG MP (CATHETERS) ×1 IMPLANT
CATH SWAN DBL LUMAN 5F 110 (CATHETERS) ×1 IMPLANT
DEVICE RAD COMP TR BAND LRG (VASCULAR PRODUCTS) ×1 IMPLANT
ELECT DEFIB PAD ADLT CADENCE (PAD) ×1 IMPLANT
GLIDESHEATH SLEND SS 6F .021 (SHEATH) ×2 IMPLANT
GUIDEWIRE INQWIRE 1.5J.035X260 (WIRE) IMPLANT
INQWIRE 1.5J .035X260CM (WIRE) ×2
KIT HEART LEFT (KITS) ×2 IMPLANT
PACK CARDIAC CATHETERIZATION (CUSTOM PROCEDURE TRAY) ×2 IMPLANT
SHEATH PROBE COVER 6X72 (BAG) ×1 IMPLANT
TRANSDUCER W/STOPCOCK (MISCELLANEOUS) ×2 IMPLANT
TUBING CIL FLEX 10 FLL-RA (TUBING) ×2 IMPLANT

## 2020-01-15 NOTE — Interval H&P Note (Signed)
History and Physical Interval Note:  01/15/2020 3:14 PM  Juan [name]  has presented today for surgery, with the diagnosis of unstable angina and new systolic heart failure.  The various methods of treatment have been discussed with the patient and family. After consideration of risks, benefits and other options for treatment, the patient has consented to  Procedure(s): RIGHT/LEFT HEART CATH AND CORONARY ANGIOGRAPHY (N/A) as a surgical intervention.  The patient's history has been reviewed, patient examined, no change in status, stable for surgery.  I have reviewed the patient's chart and labs.  Questions were answered to the patient's satisfaction.    Cath Lab Visit (complete for each Cath Lab visit)  Clinical Evaluation Leading to the Procedure:   ACS: Yes.    Non-ACS:  N/A  [name]

## 2020-01-15 NOTE — H&P (View-Only) (Signed)
Progress Note  Patient Name: Juan [name] Date of Encounter: 01/15/2020  [facility] Cardiologist: Skeet Latch, MD   Subjective  Brief HPI: 79 y.o. male with a PMH of HLD, chronic LBBB, DM type 2, GERD, esophageal cancer s/p chemo/XRT in 2017 (currently in remission), and former tobacco abuse, who is being seen today for the evaluation of chest pain found to have LVEF 25% with regional WMA concerning for ischemic cardiomyopathy.  Subjective: Comfortable this morning. No chest pain or SOB.   Inpatient Medications    Scheduled Meds: . amitriptyline  10 mg Oral QHS  . aspirin EC  81 mg Oral Daily  . atorvastatin  80 mg Oral q1800  . celecoxib  200 mg Oral Daily  . enoxaparin (LOVENOX) injection  40 mg Subcutaneous Q24H  . fluticasone furoate-vilanterol  1 puff Inhalation Daily  . gabapentin  300 mg Oral BID  . hydrALAZINE  25 mg Oral Q8H  . insulin aspart  0-9 Units Subcutaneous Q4H  . isosorbide mononitrate  30 mg Oral Daily  . pantoprazole  40 mg Oral Daily  . sodium chloride flush  3 mL Intravenous Q12H   Continuous Infusions: . sodium chloride    . sodium chloride 10 mL/hr at 01/15/20 0630   PRN Meds: sodium chloride, acetaminophen, albuterol, fluticasone, hydrALAZINE, morphine injection, nitroGLYCERIN, ondansetron (ZOFRAN) IV, sodium chloride flush   Vital Signs    Vitals:   01/15/20 0104 01/15/20 0413 01/15/20 0616 01/15/20 0837  BP:  (!) 150/78 (!) 154/83 (!) 149/99  Pulse:  (!) 51  (!) 57  Resp:  18  16  Temp:  98.5 F (36.9 C)  98.9 F (37.2 C)  TempSrc:  Oral  Oral  SpO2:  97%  95%  Weight: 89.9 kg     Height:        Intake/Output Summary (Last 24 hours) at 01/15/2020 0942 Last data filed at 01/15/2020 0630 Gross per 24 hour  Intake 958.18 ml  Output 350 ml  Net 608.18 ml   Last 3 Weights 01/15/2020 01/14/2020 01/13/2020  Weight (lbs) 198 lb 3.2 oz 198 lb 1.6 oz 206 lb  Weight (kg) 89.903 kg 89.858 kg 93.441 kg      Telemetry     Sinus rhythm, sinus brady with LBBB, 1 degree AVB, PAC and occasional PVCs - Personally Reviewed  ECG   Sinus bradycardia with chronic LBBB - Personally Reviewed  Physical Exam   GEN: No acute distress.   Neck: No JVD Cardiac: Bradycardic, regular, no murmurs, rubs, or gallops.  Respiratory: Clear to auscultation bilaterally. GI: Soft, nontender, non-distended, +BS MS: No edema; No deformity. Neuro:  Nonfocal  Psych: Normal affect   Labs    High Sensitivity Troponin:   Recent Labs  Lab 01/13/20 1105 01/13/20 1306 01/13/20 1709  TROPONINIHS 22* 25* 21*      Chemistry Recent Labs  Lab 01/09/20 1056 01/13/20 1105 01/14/20 1321  NA 142 142 138  K 4.0 3.3* 3.5  CL 107 108 105  CO2 30 26 21*  GLUCOSE 104* 123* 164*  BUN 7* 9 7*  CREATININE 0.99 1.00 1.00  CALCIUM 8.8* 8.5* 8.9  PROT 6.8  --   --   ALBUMIN 3.7  --   --   AST 23  --   --   ALT 16  --   --   ALKPHOS 69  --   --   BILITOT 0.5  --   --   GFRNONAA >60 >60 >60  ANIONGAP 5 8 12      Hematology Recent Labs  Lab 01/09/20 1056 01/13/20 1105 01/14/20 1321  WBC 4.2 2.8* 6.1  RBC 4.87 4.87 5.44  HGB 11.4* 11.2* 12.6*  HCT 37.4* 37.5* 40.6  MCV 76.8* 77.0* 74.6*  MCH 23.4* 23.0* 23.2*  MCHC 30.5 29.9* 31.0  RDW 15.9* 15.6* 15.0  PLT 218 231 244    BNPNo results for input(s): BNP, PROBNP in the last 168 hours.   DDimer  Recent Labs  Lab 01/13/20 1540  DDIMER 0.42     Radiology    DG Chest 2 View  Result Date: 01/13/2020 CLINICAL DATA:  79 year old male with chest pain and shortness of breath. EXAM: CHEST - 2 VIEW COMPARISON:  None. FINDINGS: The heart size and mediastinal contours are within normal limits. Both lungs are clear. Multilevel degenerative changes of the visualized thoracic spine. No acute osseous abnormality. IMPRESSION: No active cardiopulmonary disease. Electronically Signed   By: Ruthann Cancer MD   On: 01/13/2020 12:36   CT Chest W Contrast  Result Date:  01/13/2020 CLINICAL DATA:  Left-sided chest pain, shortness of breath, nausea, history of esophageal cancer EXAM: CT CHEST, ABDOMEN, AND PELVIS WITH CONTRAST TECHNIQUE: Multidetector CT imaging of the chest, abdomen and pelvis was performed following the standard protocol during bolus administration of intravenous contrast. CONTRAST:  128mL OMNIPAQUE IOHEXOL 300 MG/ML  SOLN COMPARISON:  07/15/2019 FINDINGS: CT CHEST FINDINGS Cardiovascular: Aortic atherosclerosis. Normal heart size. Left coronary artery calcifications. No pericardial effusion. Mediastinum/Nodes: Prominent mediastinal lymph nodes, unchanged compared to prior examination. Thyroid gland, trachea, and esophagus demonstrate no significant findings. Lungs/Pleura: Mild centrilobular emphysema. Diffuse bilateral bronchial wall thickening. Mild interlobular septal thickening and trace bilateral pleural effusions. No pleural effusion or pneumothorax. Musculoskeletal: No chest wall mass or suspicious bone lesions identified. CT ABDOMEN PELVIS FINDINGS Hepatobiliary: No solid liver abnormality is seen. No gallstones, gallbladder wall thickening, or biliary dilatation. Pancreas: Unremarkable. No pancreatic ductal dilatation or surrounding inflammatory changes. Spleen: Normal in size without significant abnormality. Adrenals/Urinary Tract: Adrenal glands are unremarkable. Small bilateral nonobstructive renal calculi. No hydronephrosis. Bladder is unremarkable. Stomach/Bowel: Stomach is within normal limits. Appendix appears normal. No evidence of bowel wall thickening, distention, or inflammatory changes. Vascular/Lymphatic: No significant vascular findings are present. No enlarged abdominal or pelvic lymph nodes. Reproductive: No mass or other abnormality. Other: No abdominal wall hernia or abnormality. No abdominopelvic ascites. Musculoskeletal: No acute or significant osseous findings. Left hip total arthroplasty. IMPRESSION: 1. Mild interlobular septal  thickening and trace bilateral pleural effusions, consistent with pulmonary edema. 2. Mild centrilobular emphysema. Emphysema (ICD10-J43.9). 3. Diffuse bilateral bronchial wall thickening, consistent with nonspecific infectious or inflammatory bronchitis. 4. No evidence of esophageal mass or other specific evidence of malignancy on this examination. 5. Prominent mediastinal lymph nodes, unchanged compared to prior examination, nonspecific. 6. Coronary artery disease. Aortic Atherosclerosis (ICD10-I70.0). 7. Nonobstructive bilateral nephrolithiasis. Electronically Signed   By: Eddie Candle M.D.   On: 01/13/2020 20:54   CT Abdomen Pelvis W Contrast  Result Date: 01/13/2020 CLINICAL DATA:  Left-sided chest pain, shortness of breath, nausea, history of esophageal cancer EXAM: CT CHEST, ABDOMEN, AND PELVIS WITH CONTRAST TECHNIQUE: Multidetector CT imaging of the chest, abdomen and pelvis was performed following the standard protocol during bolus administration of intravenous contrast. CONTRAST:  143mL OMNIPAQUE IOHEXOL 300 MG/ML  SOLN COMPARISON:  07/15/2019 FINDINGS: CT CHEST FINDINGS Cardiovascular: Aortic atherosclerosis. Normal heart size. Left coronary artery calcifications. No pericardial effusion. Mediastinum/Nodes: Prominent mediastinal lymph nodes, unchanged compared to prior  examination. Thyroid gland, trachea, and esophagus demonstrate no significant findings. Lungs/Pleura: Mild centrilobular emphysema. Diffuse bilateral bronchial wall thickening. Mild interlobular septal thickening and trace bilateral pleural effusions. No pleural effusion or pneumothorax. Musculoskeletal: No chest wall mass or suspicious bone lesions identified. CT ABDOMEN PELVIS FINDINGS Hepatobiliary: No solid liver abnormality is seen. No gallstones, gallbladder wall thickening, or biliary dilatation. Pancreas: Unremarkable. No pancreatic ductal dilatation or surrounding inflammatory changes. Spleen: Normal in size without  significant abnormality. Adrenals/Urinary Tract: Adrenal glands are unremarkable. Small bilateral nonobstructive renal calculi. No hydronephrosis. Bladder is unremarkable. Stomach/Bowel: Stomach is within normal limits. Appendix appears normal. No evidence of bowel wall thickening, distention, or inflammatory changes. Vascular/Lymphatic: No significant vascular findings are present. No enlarged abdominal or pelvic lymph nodes. Reproductive: No mass or other abnormality. Other: No abdominal wall hernia or abnormality. No abdominopelvic ascites. Musculoskeletal: No acute or significant osseous findings. Left hip total arthroplasty. IMPRESSION: 1. Mild interlobular septal thickening and trace bilateral pleural effusions, consistent with pulmonary edema. 2. Mild centrilobular emphysema. Emphysema (ICD10-J43.9). 3. Diffuse bilateral bronchial wall thickening, consistent with nonspecific infectious or inflammatory bronchitis. 4. No evidence of esophageal mass or other specific evidence of malignancy on this examination. 5. Prominent mediastinal lymph nodes, unchanged compared to prior examination, nonspecific. 6. Coronary artery disease. Aortic Atherosclerosis (ICD10-I70.0). 7. Nonobstructive bilateral nephrolithiasis. Electronically Signed   By: Eddie Candle M.D.   On: 01/13/2020 20:54   ECHOCARDIOGRAM COMPLETE  Result Date: 01/14/2020    ECHOCARDIOGRAM REPORT   Patient Name:   Juan [name] Date of Exam: 01/14/2020 Medical Rec #:  [id number]  Height:       69.0 in Accession #:    [id number] Weight:       198.1 lb Date of Birth:  1941/03/19  BSA:          2.058 m Patient Age:    82 years   BP:           161/97 mmHg Patient Gender: M          HR:           57 bpm. Exam Location:  Inpatient Procedure: 2D Echo, Cardiac Doppler, Color Doppler and Intracardiac            Opacification Agent Indications:    Chest pain                 LBBB                 Esophageal cancer  History:        Patient has no prior history of  Echocardiogram examinations.                 Arrythmias:LBBB, Signs/Symptoms:Chest Pain and Shortness of                 Breath; Risk Factors:Diabetes. H/O esophageal cancer.  Sonographer:    Clayton Lefort RDCS (AE) Referring Phys: [id number] GAYATRI A [place]  1. Left ventricular ejection fraction, by estimation, is 25%. The left ventricle has severely decreased function. The left ventricle demonstrates regional wall motion abnormalities (see scoring diagram/findings for description). There is moderate left ventricular hypertrophy. Left ventricular diastolic parameters are consistent with Grade II diastolic dysfunction (pseudonormalization).  2. Right ventricular systolic function is normal. The right ventricular size is normal. Tricuspid regurgitation signal is inadequate for assessing PA pressure.  3. Left atrial size was mild to moderately dilated.  4. The mitral valve is grossly normal. Mild mitral valve regurgitation. No  evidence of mitral stenosis.  5. The aortic valve is tricuspid. There is mild calcification of the aortic valve. There is mild thickening of the aortic valve. Aortic valve regurgitation is trivial. No aortic stenosis is present.  6. The inferior vena cava is normal in size with greater than 50% respiratory variability, suggesting right atrial pressure of 3 mmHg. Conclusion(s)/Recommendation(s): No left ventricular mural or apical thrombus/thrombi. FINDINGS  Left Ventricle: Left ventricular ejection fraction, by estimation, is 25%. The left ventricle has severely decreased function. The left ventricle demonstrates regional wall motion abnormalities. Definity contrast agent was given IV to delineate the left  ventricular endocardial borders. The left ventricular internal cavity size was normal in size. There is moderate left ventricular hypertrophy. Left ventricular diastolic parameters are consistent with Grade II diastolic dysfunction (pseudonormalization).  LV Wall Scoring: The entire  anterior septum and mid inferoseptal segment are akinetic. The apical lateral segment, apical anterior segment, basal anterior segment, apical inferior segment, and basal inferoseptal segment are hypokinetic. Right Ventricle: The right ventricular size is normal. No increase in right ventricular wall thickness. Right ventricular systolic function is normal. Tricuspid regurgitation signal is inadequate for assessing PA pressure. Left Atrium: Left atrial size was mild to moderately dilated. Right Atrium: Right atrial size was normal in size. Pericardium: There is no evidence of pericardial effusion. Mitral Valve: The mitral valve is grossly normal. Mild mitral valve regurgitation. No evidence of mitral valve stenosis. Tricuspid Valve: The tricuspid valve is normal in structure. Tricuspid valve regurgitation is trivial. No evidence of tricuspid stenosis. Aortic Valve: The aortic valve is tricuspid. There is mild calcification of the aortic valve. There is mild thickening of the aortic valve. Aortic valve regurgitation is trivial. No aortic stenosis is present. Aortic valve mean gradient measures 3.7 mmHg. Aortic valve peak gradient measures 7.6 mmHg. Aortic valve area, by VTI measures 2.31 cm. Pulmonic Valve: The pulmonic valve was grossly normal. Pulmonic valve regurgitation is trivial. No evidence of pulmonic stenosis. Aorta: The aortic root is normal in size and structure. Venous: The inferior vena cava is normal in size with greater than 50% respiratory variability, suggesting right atrial pressure of 3 mmHg. IAS/Shunts: No atrial level shunt detected by color flow Doppler.  LEFT VENTRICLE PLAX 2D LVIDd:         4.70 cm LVIDs:         4.20 cm LV PW:         1.40 cm LV IVS:        1.60 cm LVOT diam:     2.10 cm LV SV:         58 LV SV Index:   28 LVOT Area:     3.46 cm  RIGHT VENTRICLE            IVC RV Basal diam:  3.60 cm    IVC diam: 1.60 cm RV S prime:     9.68 cm/s TAPSE (M-mode): 2.7 cm LEFT ATRIUM              Index       RIGHT ATRIUM           Index LA diam:        4.40 cm 2.14 cm/m  RA Area:     15.30 cm LA Vol (A2C):   82.2 ml 39.95 ml/m RA Volume:   34.90 ml  16.96 ml/m LA Vol (A4C):   84.5 ml 41.07 ml/m LA Biplane Vol: 85.7 ml 41.65 ml/m  AORTIC VALVE AV Area (  Vmax):    2.35 cm AV Area (Vmean):   2.38 cm AV Area (VTI):     2.31 cm AV Vmax:           138.00 cm/s AV Vmean:          89.933 cm/s AV VTI:            0.252 m AV Peak Grad:      7.6 mmHg AV Mean Grad:      3.7 mmHg LVOT Vmax:         93.77 cm/s LVOT Vmean:        61.733 cm/s LVOT VTI:          0.168 m LVOT/AV VTI ratio: 0.67  AORTA Ao Root diam: 2.90 cm Ao Asc diam:  3.30 cm MITRAL VALVE MV Area (PHT): 2.76 cm    SHUNTS MV Decel Time: 275 msec    Systemic VTI:  0.17 m MV E velocity: 91.70 cm/s  Systemic Diam: 2.10 cm MV A velocity: 73.70 cm/s MV E/A ratio:  1.24 Cherlynn Kaiser MD Electronically signed by Cherlynn Kaiser MD Signature Date/Time: 01/14/2020/11:25:24 AM    Final     Cardiac Studies   TTE 01/15/20: IMPRESSIONS  1. Left ventricular ejection fraction, by estimation, is 25%. The left  ventricle has severely decreased function. The left ventricle demonstrates  regional wall motion abnormalities (see scoring diagram/findings for  description). There is moderate left  ventricular hypertrophy. Left ventricular diastolic parameters are  consistent with Grade II diastolic dysfunction (pseudonormalization).  2. Right ventricular systolic function is normal. The right ventricular  size is normal. Tricuspid regurgitation signal is inadequate for assessing  PA pressure.  3. Left atrial size was mild to moderately dilated.  4. The mitral valve is grossly normal. Mild mitral valve regurgitation.  No evidence of mitral stenosis.  5. The aortic valve is tricuspid. There is mild calcification of the  aortic valve. There is mild thickening of the aortic valve. Aortic valve  regurgitation is trivial. No aortic stenosis is  present.  6. The inferior vena cava is normal in size with greater than 50%  respiratory variability, suggesting right atrial pressure of 3 mmHg.   Conclusion(s)/Recommendation(s): No left ventricular mural or apical  thrombus/thrombi.   Patient Profile     79 y.o. male with a PMH of HLD, chronic LBBB, DM type 2, GERD, esophageal cancer s/p chemo/XRT in 2017 (currently in remission), and former tobacco abuse, who is being seen today for the evaluation of chest pain found to have LVEF 25% with regional WMA concerning for ischemic cardiomyopathy.  Assessment & Plan    #Chest Pain: #Acute systolic heart failure: Patient with significant regional wall motion abnormalities on TTE with EF 25% concerning for ischemic cardiomyopathy. Has chronic LBBB. Awaiting LHC. -RHC and LHC today -Continue hydralazine and imdur; plan to transition to entresto post-cath -BB use limited due to bradycardia -Add spironolactone post-cath pending renal function -Continue ASA 81mg  daily -Continue atorvastatin 80mg  daily -I/Os and daily weights -Appears euvolemic, no further diuresis this AM  #HTN: -Hydralazine/imdur for now -Plan to start entresto post cath  #HLD: -Continue atorvastatin 80mg  daily  #DMII: -Management per primary team -SGLT-2 inhibitor prior to discharge  #Esophageal cancer s/p chemo/XRT #GERD -Management per primary team      For questions or updates, please contact [facility] Please consult [email or web address] for contact info under        Signed, Freada Bergeron, MD  01/15/2020, 9:42 AM

## 2020-01-15 NOTE — Progress Notes (Signed)
Received pt post Heart Cath, alert and oriented, no complaints of any discomfort. Right radial level 0, pt able to move fingers.will monitor

## 2020-01-15 NOTE — Progress Notes (Signed)
Heart Failure Stewardship Pharmacist Progress Note   PCP: [name], [name]), MD (Inactive) PCP-Cardiologist: Skeet Latch, MD    HPI:  79 yo M with PMH of HLD, chronic LBBB, type 2 diabetes, GERD, esophageal cancer, and former tobacco abuser. He presented to the ED on 01/13/20 with worsening shortness of breath and nausea. An ECHO was done on 01/14/20 and LVEF is 25%. Pending R/LHC today.  Current HF Medications: Hydralazine 25 mg TID Imdur 30 mg daily  Prior to admission HF Medications: None  Pertinent Lab Values: . Serum creatinine 1.00, BUN 7, Potassium 3.5, Sodium 138  Vital Signs: . Weight: 198 lbs (admission weight: 206 lbs) . Blood pressure: 150/80s  . Heart rate: 70s   Medication Assistance / Insurance Benefits Check: Does the patient have prescription insurance?  Yes Type of insurance plan: [employer]  Does the patient qualify for medication assistance through manufacturers or grants?   Pending . Eligible grants and/or patient assistance programs: pending . Medication assistance applications in progress: none  . Medication assistance applications approved: none Approved medication assistance renewals will be completed by: Dr. Blenda Mounts office  Outpatient Pharmacy:  Prior to admission outpatient pharmacy: [facility] Is the patient willing to use [facility] at discharge? Yes Is the patient willing to transition their outpatient pharmacy to utilize a [facility] outpatient pharmacy?   Pending    Assessment: 1. Acute systolic CHF (EF 86%), due to unknown etiology. Pending R/LHC today. NYHA class II symptoms. - Continue hydralazine 25 mg TID - Continue Imdur 30 mg daily - Consider adding Entresto + beta blocker following cath today   Plan: 1) Medication changes recommended at this time: - Add Entresto +/- beta blocker pending cath results today  2) Patient assistance application(s): - None pending  3)  Education  - To be completed  prior to discharge  Kerby Nora, PharmD, BCPS Heart Failure Stewardship Pharmacist Phone [phone]

## 2020-01-15 NOTE — Progress Notes (Signed)
°   01/15/20 1012  Clinical Encounter Type  Visited With Patient  Visit Type Initial  Referral From Nurse  Consult/Referral To Chaplain  Spiritual Encounters  Spiritual Needs Literature;Prayer  Chaplain responded to [facility] fore Juan [name], he requested a bible.  Bible given to Pt, we talked on his procedure he was having today and said a prayer.  Chaplain Jerrine Urschel [place]  [phone]

## 2020-01-15 NOTE — Progress Notes (Signed)
Progress Note  Patient Name: Juan [name] Date of Encounter: 01/15/2020  [facility] Cardiologist: Skeet Latch, MD   Subjective  Brief HPI: 79 y.o. male with a PMH of HLD, chronic LBBB, DM type 2, GERD, esophageal cancer s/p chemo/XRT in 2017 (currently in remission), and former tobacco abuse, who is being seen today for the evaluation of chest pain found to have LVEF 25% with regional WMA concerning for ischemic cardiomyopathy.  Subjective: Comfortable this morning. No chest pain or SOB.   Inpatient Medications    Scheduled Meds: . amitriptyline  10 mg Oral QHS  . aspirin EC  81 mg Oral Daily  . atorvastatin  80 mg Oral q1800  . celecoxib  200 mg Oral Daily  . enoxaparin (LOVENOX) injection  40 mg Subcutaneous Q24H  . fluticasone furoate-vilanterol  1 puff Inhalation Daily  . gabapentin  300 mg Oral BID  . hydrALAZINE  25 mg Oral Q8H  . insulin aspart  0-9 Units Subcutaneous Q4H  . isosorbide mononitrate  30 mg Oral Daily  . pantoprazole  40 mg Oral Daily  . sodium chloride flush  3 mL Intravenous Q12H   Continuous Infusions: . sodium chloride    . sodium chloride 10 mL/hr at 01/15/20 0630   PRN Meds: sodium chloride, acetaminophen, albuterol, fluticasone, hydrALAZINE, morphine injection, nitroGLYCERIN, ondansetron (ZOFRAN) IV, sodium chloride flush   Vital Signs    Vitals:   01/15/20 0104 01/15/20 0413 01/15/20 0616 01/15/20 0837  BP:  (!) 150/78 (!) 154/83 (!) 149/99  Pulse:  (!) 51  (!) 57  Resp:  18  16  Temp:  98.5 F (36.9 C)  98.9 F (37.2 C)  TempSrc:  Oral  Oral  SpO2:  97%  95%  Weight: 89.9 kg     Height:        Intake/Output Summary (Last 24 hours) at 01/15/2020 0942 Last data filed at 01/15/2020 0630 Gross per 24 hour  Intake 958.18 ml  Output 350 ml  Net 608.18 ml   Last 3 Weights 01/15/2020 01/14/2020 01/13/2020  Weight (lbs) 198 lb 3.2 oz 198 lb 1.6 oz 206 lb  Weight (kg) 89.903 kg 89.858 kg 93.441 kg      Telemetry     Sinus rhythm, sinus brady with LBBB, 1 degree AVB, PAC and occasional PVCs - Personally Reviewed  ECG   Sinus bradycardia with chronic LBBB - Personally Reviewed  Physical Exam   GEN: No acute distress.   Neck: No JVD Cardiac: Bradycardic, regular, no murmurs, rubs, or gallops.  Respiratory: Clear to auscultation bilaterally. GI: Soft, nontender, non-distended, +BS MS: No edema; No deformity. Neuro:  Nonfocal  Psych: Normal affect   Labs    High Sensitivity Troponin:   Recent Labs  Lab 01/13/20 1105 01/13/20 1306 01/13/20 1709  TROPONINIHS 22* 25* 21*      Chemistry Recent Labs  Lab 01/09/20 1056 01/13/20 1105 01/14/20 1321  NA 142 142 138  K 4.0 3.3* 3.5  CL 107 108 105  CO2 30 26 21*  GLUCOSE 104* 123* 164*  BUN 7* 9 7*  CREATININE 0.99 1.00 1.00  CALCIUM 8.8* 8.5* 8.9  PROT 6.8  --   --   ALBUMIN 3.7  --   --   AST 23  --   --   ALT 16  --   --   ALKPHOS 69  --   --   BILITOT 0.5  --   --   GFRNONAA >60 >60 >60  ANIONGAP 5 8 12      Hematology Recent Labs  Lab 01/09/20 1056 01/13/20 1105 01/14/20 1321  WBC 4.2 2.8* 6.1  RBC 4.87 4.87 5.44  HGB 11.4* 11.2* 12.6*  HCT 37.4* 37.5* 40.6  MCV 76.8* 77.0* 74.6*  MCH 23.4* 23.0* 23.2*  MCHC 30.5 29.9* 31.0  RDW 15.9* 15.6* 15.0  PLT 218 231 244    BNPNo results for input(s): BNP, PROBNP in the last 168 hours.   DDimer  Recent Labs  Lab 01/13/20 1540  DDIMER 0.42     Radiology    DG Chest 2 View  Result Date: 01/13/2020 CLINICAL DATA:  79 year old male with chest pain and shortness of breath. EXAM: CHEST - 2 VIEW COMPARISON:  None. FINDINGS: The heart size and mediastinal contours are within normal limits. Both lungs are clear. Multilevel degenerative changes of the visualized thoracic spine. No acute osseous abnormality. IMPRESSION: No active cardiopulmonary disease. Electronically Signed   By: Ruthann Cancer MD   On: 01/13/2020 12:36   CT Chest W Contrast  Result Date:  01/13/2020 CLINICAL DATA:  Left-sided chest pain, shortness of breath, nausea, history of esophageal cancer EXAM: CT CHEST, ABDOMEN, AND PELVIS WITH CONTRAST TECHNIQUE: Multidetector CT imaging of the chest, abdomen and pelvis was performed following the standard protocol during bolus administration of intravenous contrast. CONTRAST:  117mL OMNIPAQUE IOHEXOL 300 MG/ML  SOLN COMPARISON:  07/15/2019 FINDINGS: CT CHEST FINDINGS Cardiovascular: Aortic atherosclerosis. Normal heart size. Left coronary artery calcifications. No pericardial effusion. Mediastinum/Nodes: Prominent mediastinal lymph nodes, unchanged compared to prior examination. Thyroid gland, trachea, and esophagus demonstrate no significant findings. Lungs/Pleura: Mild centrilobular emphysema. Diffuse bilateral bronchial wall thickening. Mild interlobular septal thickening and trace bilateral pleural effusions. No pleural effusion or pneumothorax. Musculoskeletal: No chest wall mass or suspicious bone lesions identified. CT ABDOMEN PELVIS FINDINGS Hepatobiliary: No solid liver abnormality is seen. No gallstones, gallbladder wall thickening, or biliary dilatation. Pancreas: Unremarkable. No pancreatic ductal dilatation or surrounding inflammatory changes. Spleen: Normal in size without significant abnormality. Adrenals/Urinary Tract: Adrenal glands are unremarkable. Small bilateral nonobstructive renal calculi. No hydronephrosis. Bladder is unremarkable. Stomach/Bowel: Stomach is within normal limits. Appendix appears normal. No evidence of bowel wall thickening, distention, or inflammatory changes. Vascular/Lymphatic: No significant vascular findings are present. No enlarged abdominal or pelvic lymph nodes. Reproductive: No mass or other abnormality. Other: No abdominal wall hernia or abnormality. No abdominopelvic ascites. Musculoskeletal: No acute or significant osseous findings. Left hip total arthroplasty. IMPRESSION: 1. Mild interlobular septal  thickening and trace bilateral pleural effusions, consistent with pulmonary edema. 2. Mild centrilobular emphysema. Emphysema (ICD10-J43.9). 3. Diffuse bilateral bronchial wall thickening, consistent with nonspecific infectious or inflammatory bronchitis. 4. No evidence of esophageal mass or other specific evidence of malignancy on this examination. 5. Prominent mediastinal lymph nodes, unchanged compared to prior examination, nonspecific. 6. Coronary artery disease. Aortic Atherosclerosis (ICD10-I70.0). 7. Nonobstructive bilateral nephrolithiasis. Electronically Signed   By: Eddie Candle M.D.   On: 01/13/2020 20:54   CT Abdomen Pelvis W Contrast  Result Date: 01/13/2020 CLINICAL DATA:  Left-sided chest pain, shortness of breath, nausea, history of esophageal cancer EXAM: CT CHEST, ABDOMEN, AND PELVIS WITH CONTRAST TECHNIQUE: Multidetector CT imaging of the chest, abdomen and pelvis was performed following the standard protocol during bolus administration of intravenous contrast. CONTRAST:  139mL OMNIPAQUE IOHEXOL 300 MG/ML  SOLN COMPARISON:  07/15/2019 FINDINGS: CT CHEST FINDINGS Cardiovascular: Aortic atherosclerosis. Normal heart size. Left coronary artery calcifications. No pericardial effusion. Mediastinum/Nodes: Prominent mediastinal lymph nodes, unchanged compared to prior  examination. Thyroid gland, trachea, and esophagus demonstrate no significant findings. Lungs/Pleura: Mild centrilobular emphysema. Diffuse bilateral bronchial wall thickening. Mild interlobular septal thickening and trace bilateral pleural effusions. No pleural effusion or pneumothorax. Musculoskeletal: No chest wall mass or suspicious bone lesions identified. CT ABDOMEN PELVIS FINDINGS Hepatobiliary: No solid liver abnormality is seen. No gallstones, gallbladder wall thickening, or biliary dilatation. Pancreas: Unremarkable. No pancreatic ductal dilatation or surrounding inflammatory changes. Spleen: Normal in size without  significant abnormality. Adrenals/Urinary Tract: Adrenal glands are unremarkable. Small bilateral nonobstructive renal calculi. No hydronephrosis. Bladder is unremarkable. Stomach/Bowel: Stomach is within normal limits. Appendix appears normal. No evidence of bowel wall thickening, distention, or inflammatory changes. Vascular/Lymphatic: No significant vascular findings are present. No enlarged abdominal or pelvic lymph nodes. Reproductive: No mass or other abnormality. Other: No abdominal wall hernia or abnormality. No abdominopelvic ascites. Musculoskeletal: No acute or significant osseous findings. Left hip total arthroplasty. IMPRESSION: 1. Mild interlobular septal thickening and trace bilateral pleural effusions, consistent with pulmonary edema. 2. Mild centrilobular emphysema. Emphysema (ICD10-J43.9). 3. Diffuse bilateral bronchial wall thickening, consistent with nonspecific infectious or inflammatory bronchitis. 4. No evidence of esophageal mass or other specific evidence of malignancy on this examination. 5. Prominent mediastinal lymph nodes, unchanged compared to prior examination, nonspecific. 6. Coronary artery disease. Aortic Atherosclerosis (ICD10-I70.0). 7. Nonobstructive bilateral nephrolithiasis. Electronically Signed   By: Eddie Candle M.D.   On: 01/13/2020 20:54   ECHOCARDIOGRAM COMPLETE  Result Date: 01/14/2020    ECHOCARDIOGRAM REPORT   Patient Name:   LORCAN SHELP Date of Exam: 01/14/2020 Medical Rec #:  [id number]  Height:       69.0 in Accession #:    [id number] Weight:       198.1 lb Date of Birth:  January 28, 1941  BSA:          2.058 m Patient Age:    2 years   BP:           161/97 mmHg Patient Gender: M          HR:           57 bpm. Exam Location:  Inpatient Procedure: 2D Echo, Cardiac Doppler, Color Doppler and Intracardiac            Opacification Agent Indications:    Chest pain                 LBBB                 Esophageal cancer  History:        Patient has no prior history of  Echocardiogram examinations.                 Arrythmias:LBBB, Signs/Symptoms:Chest Pain and Shortness of                 Breath; Risk Factors:Diabetes. H/O esophageal cancer.  Sonographer:    Clayton Lefort RDCS (AE) Referring Phys: [id number] GAYATRI A [place]  1. Left ventricular ejection fraction, by estimation, is 25%. The left ventricle has severely decreased function. The left ventricle demonstrates regional wall motion abnormalities (see scoring diagram/findings for description). There is moderate left ventricular hypertrophy. Left ventricular diastolic parameters are consistent with Grade II diastolic dysfunction (pseudonormalization).  2. Right ventricular systolic function is normal. The right ventricular size is normal. Tricuspid regurgitation signal is inadequate for assessing PA pressure.  3. Left atrial size was mild to moderately dilated.  4. The mitral valve is grossly normal. Mild mitral valve regurgitation. No  evidence of mitral stenosis.  5. The aortic valve is tricuspid. There is mild calcification of the aortic valve. There is mild thickening of the aortic valve. Aortic valve regurgitation is trivial. No aortic stenosis is present.  6. The inferior vena cava is normal in size with greater than 50% respiratory variability, suggesting right atrial pressure of 3 mmHg. Conclusion(s)/Recommendation(s): No left ventricular mural or apical thrombus/thrombi. FINDINGS  Left Ventricle: Left ventricular ejection fraction, by estimation, is 25%. The left ventricle has severely decreased function. The left ventricle demonstrates regional wall motion abnormalities. Definity contrast agent was given IV to delineate the left  ventricular endocardial borders. The left ventricular internal cavity size was normal in size. There is moderate left ventricular hypertrophy. Left ventricular diastolic parameters are consistent with Grade II diastolic dysfunction (pseudonormalization).  LV Wall Scoring: The entire  anterior septum and mid inferoseptal segment are akinetic. The apical lateral segment, apical anterior segment, basal anterior segment, apical inferior segment, and basal inferoseptal segment are hypokinetic. Right Ventricle: The right ventricular size is normal. No increase in right ventricular wall thickness. Right ventricular systolic function is normal. Tricuspid regurgitation signal is inadequate for assessing PA pressure. Left Atrium: Left atrial size was mild to moderately dilated. Right Atrium: Right atrial size was normal in size. Pericardium: There is no evidence of pericardial effusion. Mitral Valve: The mitral valve is grossly normal. Mild mitral valve regurgitation. No evidence of mitral valve stenosis. Tricuspid Valve: The tricuspid valve is normal in structure. Tricuspid valve regurgitation is trivial. No evidence of tricuspid stenosis. Aortic Valve: The aortic valve is tricuspid. There is mild calcification of the aortic valve. There is mild thickening of the aortic valve. Aortic valve regurgitation is trivial. No aortic stenosis is present. Aortic valve mean gradient measures 3.7 mmHg. Aortic valve peak gradient measures 7.6 mmHg. Aortic valve area, by VTI measures 2.31 cm. Pulmonic Valve: The pulmonic valve was grossly normal. Pulmonic valve regurgitation is trivial. No evidence of pulmonic stenosis. Aorta: The aortic root is normal in size and structure. Venous: The inferior vena cava is normal in size with greater than 50% respiratory variability, suggesting right atrial pressure of 3 mmHg. IAS/Shunts: No atrial level shunt detected by color flow Doppler.  LEFT VENTRICLE PLAX 2D LVIDd:         4.70 cm LVIDs:         4.20 cm LV PW:         1.40 cm LV IVS:        1.60 cm LVOT diam:     2.10 cm LV SV:         58 LV SV Index:   28 LVOT Area:     3.46 cm  RIGHT VENTRICLE            IVC RV Basal diam:  3.60 cm    IVC diam: 1.60 cm RV S prime:     9.68 cm/s TAPSE (M-mode): 2.7 cm LEFT ATRIUM              Index       RIGHT ATRIUM           Index LA diam:        4.40 cm 2.14 cm/m  RA Area:     15.30 cm LA Vol (A2C):   82.2 ml 39.95 ml/m RA Volume:   34.90 ml  16.96 ml/m LA Vol (A4C):   84.5 ml 41.07 ml/m LA Biplane Vol: 85.7 ml 41.65 ml/m  AORTIC VALVE AV Area (  Vmax):    2.35 cm AV Area (Vmean):   2.38 cm AV Area (VTI):     2.31 cm AV Vmax:           138.00 cm/s AV Vmean:          89.933 cm/s AV VTI:            0.252 m AV Peak Grad:      7.6 mmHg AV Mean Grad:      3.7 mmHg LVOT Vmax:         93.77 cm/s LVOT Vmean:        61.733 cm/s LVOT VTI:          0.168 m LVOT/AV VTI ratio: 0.67  AORTA Ao Root diam: 2.90 cm Ao Asc diam:  3.30 cm MITRAL VALVE MV Area (PHT): 2.76 cm    SHUNTS MV Decel Time: 275 msec    Systemic VTI:  0.17 m MV E velocity: 91.70 cm/s  Systemic Diam: 2.10 cm MV A velocity: 73.70 cm/s MV E/A ratio:  1.24 Cherlynn Kaiser MD Electronically signed by Cherlynn Kaiser MD Signature Date/Time: 01/14/2020/11:25:24 AM    Final     Cardiac Studies   TTE 01/15/20: IMPRESSIONS  1. Left ventricular ejection fraction, by estimation, is 25%. The left  ventricle has severely decreased function. The left ventricle demonstrates  regional wall motion abnormalities (see scoring diagram/findings for  description). There is moderate left  ventricular hypertrophy. Left ventricular diastolic parameters are  consistent with Grade II diastolic dysfunction (pseudonormalization).  2. Right ventricular systolic function is normal. The right ventricular  size is normal. Tricuspid regurgitation signal is inadequate for assessing  PA pressure.  3. Left atrial size was mild to moderately dilated.  4. The mitral valve is grossly normal. Mild mitral valve regurgitation.  No evidence of mitral stenosis.  5. The aortic valve is tricuspid. There is mild calcification of the  aortic valve. There is mild thickening of the aortic valve. Aortic valve  regurgitation is trivial. No aortic stenosis is  present.  6. The inferior vena cava is normal in size with greater than 50%  respiratory variability, suggesting right atrial pressure of 3 mmHg.   Conclusion(s)/Recommendation(s): No left ventricular mural or apical  thrombus/thrombi.   Patient Profile     79 y.o. male with a PMH of HLD, chronic LBBB, DM type 2, GERD, esophageal cancer s/p chemo/XRT in 2017 (currently in remission), and former tobacco abuse, who is being seen today for the evaluation of chest pain found to have LVEF 25% with regional WMA concerning for ischemic cardiomyopathy.  Assessment & Plan    #Chest Pain: #Acute systolic heart failure: Patient with significant regional wall motion abnormalities on TTE with EF 25% concerning for ischemic cardiomyopathy. Has chronic LBBB. Awaiting LHC. -RHC and LHC today -Continue hydralazine and imdur; plan to transition to entresto post-cath -BB use limited due to bradycardia -Add spironolactone post-cath pending renal function -Continue ASA 81mg  daily -Continue atorvastatin 80mg  daily -I/Os and daily weights -Appears euvolemic, no further diuresis this AM  #HTN: -Hydralazine/imdur for now -Plan to start entresto post cath  #HLD: -Continue atorvastatin 80mg  daily  #DMII: -Management per primary team -SGLT-2 inhibitor prior to discharge  #Esophageal cancer s/p chemo/XRT #GERD -Management per primary team      For questions or updates, please contact [facility] Please consult [email or web address] for contact info under        Signed, Freada Bergeron, MD  01/15/2020, 9:42 AM

## 2020-01-15 NOTE — Progress Notes (Signed)
PROGRESS NOTE  Juan [name] IRW:[id number] DOB: Apr 21, 1940 DOA: 01/13/2020 PCP: [name], [name]), MD (Inactive)  Brief History   79 year old man presented with intermittent left chest pain, shortness of breath, swelling.  Admitted for further evaluation of chest pain. Found to have new systolic CHF. Plan for [facility] 10/18.   A & P  Acute systolic CHF, new dx, w/ regional wall motion abnormality, chronic left bundle branch block. LVEF 25% --appears stable. No sig LE edema. Plan for right and left heart catheterization today as per cardiology --Continu ehydralazine, Imdur.  Add ARNI after catheterization.  Beta-blocker not started given baseline bradycardia.  Consider spironolactone.  Diabetes mellitus type 2 --remains stable.  Continue to hold oral home medications --continue sliding scale insulin  PMH esophageal cancer in remission --Continue to follow-up as an outpatient  Emphysema by CT --appears stable, f/u as an outpt  Aortic atherosclerosis --f/u as an outpt  Disposition Plan:  Discussion: plan as above  Status is: Inpt  Dispo: The patient is from: Home              Anticipated d/c is to: Home              Anticipated d/c date is: 2 days              Patient currently is not medically stable to d/c.  DVT prophylaxis: enoxaparin (LOVENOX) injection 40 mg Start: 01/13/20 2100   Code Status: Full Code Family Communication: none  [name], MD  [facility] Direct contact: see [email or web address] (further directions at bottom of note if needed) 7PM-7AM contact night coverage as at bottom of note 01/15/2020, 3:01 PM  LOS: 1 day   Significant Hospital Events   .    Consults:  . Cardiology    Procedures:  .   Significant Diagnostic Tests:  Marland Kitchen    Micro Data:  .    Antimicrobials:  .   Interval History/Subjective  CC: f/u SOB  Feels ok, tired but breathing ok.  Objective   Vitals:  Vitals:   01/15/20 0837 01/15/20 1439  BP: (!)  149/99 139/77  Pulse: (!) 57 60  Resp: 16 16  Temp: 98.9 F (37.2 C) 98.7 F (37.1 C)  SpO2: 95% 97%    Exam:  Physical Exam Vitals reviewed.  Constitutional:      General: He is not in acute distress.    Appearance: He is well-developed. He is not ill-appearing or toxic-appearing.     Comments: Appears mildly short of breath  Cardiovascular:     Rate and Rhythm: Normal rate and regular rhythm.     Heart sounds: Normal heart sounds. No murmur heard.  No friction rub. No gallop.   Pulmonary:     Effort: Pulmonary effort is normal. No respiratory distress.     Breath sounds: Normal breath sounds. No wheezing or rales.  Neurological:     Mental Status: He is alert.  Psychiatric:        Mood and Affect: Mood normal.        Behavior: Behavior normal.      I have personally reviewed the following:   Today's Data  . CBG stable  Scheduled Meds: . [MAR Hold] amitriptyline  10 mg Oral QHS  . [MAR Hold] aspirin EC  81 mg Oral Daily  . [MAR Hold] atorvastatin  80 mg Oral q1800  . [MAR Hold] celecoxib  200 mg Oral Daily  . [MAR Hold] enoxaparin (LOVENOX) injection  40 mg  Subcutaneous Q24H  . [MAR Hold] fluticasone furoate-vilanterol  1 puff Inhalation Daily  . [MAR Hold] gabapentin  300 mg Oral BID  . [MAR Hold] hydrALAZINE  25 mg Oral Q8H  . [MAR Hold] insulin aspart  0-9 Units Subcutaneous Q4H  . [MAR Hold] isosorbide mononitrate  30 mg Oral Daily  . [MAR Hold] pantoprazole  40 mg Oral Daily  . [MAR Hold] sodium chloride flush  3 mL Intravenous Q12H   Continuous Infusions: . sodium chloride    . sodium chloride 10 mL/hr at 01/15/20 0630    Principal Problem:   Chest pain, rule out acute myocardial infarction Active Problems:   Esophageal cancer (HCC)   LBBB (left bundle branch block)   DM2 (diabetes mellitus, type 2) (HCC)   Elevated blood-pressure reading without diagnosis of hypertension   Acute systolic CHF (congestive heart failure) ([place])   Emphysema lung  ([place])   Aortic atherosclerosis ([place])   LOS: 1 day   How to contact the [facility] Attending or Consulting provider 7A - 7P or covering provider during after hours [place], for this patient?  1. Check the care team in [facility] and look for a) attending/consulting [facility] provider listed and b) the [facility] team listed 2. Log into [email or web address] and use [place]'s universal password to access. If you do not have the password, please contact the hospital operator. 3. Locate the [facility] provider you are looking for under [facility] and page to a number that you can be directly reached. 4. If you still have difficulty reaching the provider, please page the [facility] (Director on Call) for the Hospitalists listed on amion for assistance.

## 2020-01-16 ENCOUNTER — Encounter (HOSPITAL_COMMUNITY): Payer: Self-pay | Admitting: Internal Medicine

## 2020-01-16 DIAGNOSIS — R079 Chest pain, unspecified: Secondary | ICD-10-CM | POA: Diagnosis not present

## 2020-01-16 DIAGNOSIS — I700 Atherosclerosis of aorta: Secondary | ICD-10-CM | POA: Diagnosis not present

## 2020-01-16 DIAGNOSIS — I5021 Acute systolic (congestive) heart failure: Secondary | ICD-10-CM | POA: Diagnosis not present

## 2020-01-16 DIAGNOSIS — E119 Type 2 diabetes mellitus without complications: Secondary | ICD-10-CM | POA: Diagnosis not present

## 2020-01-16 LAB — CBC
HCT: 36.8 % — ABNORMAL LOW (ref 39.0–52.0)
Hemoglobin: 11.4 g/dL — ABNORMAL LOW (ref 13.0–17.0)
MCH: 23.5 pg — ABNORMAL LOW (ref 26.0–34.0)
MCHC: 31 g/dL (ref 30.0–36.0)
MCV: 75.9 fL — ABNORMAL LOW (ref 80.0–100.0)
Platelets: 225 10*3/uL (ref 150–400)
RBC: 4.85 MIL/uL (ref 4.22–5.81)
RDW: 15.4 % (ref 11.5–15.5)
WBC: 4.6 10*3/uL (ref 4.0–10.5)
nRBC: 0 % (ref 0.0–0.2)

## 2020-01-16 LAB — BASIC METABOLIC PANEL
Anion gap: 9 (ref 5–15)
BUN: 13 mg/dL (ref 8–23)
CO2: 24 mmol/L (ref 22–32)
Calcium: 8.8 mg/dL — ABNORMAL LOW (ref 8.9–10.3)
Chloride: 104 mmol/L (ref 98–111)
Creatinine, Ser: 1.06 mg/dL (ref 0.61–1.24)
GFR, Estimated: >60 mL/min (ref >60)
Glucose, Bld: 127 mg/dL — ABNORMAL HIGH (ref 70–99)
Potassium: 3.5 mmol/L (ref 3.5–5.1)
Sodium: 137 mmol/L (ref 135–145)

## 2020-01-16 LAB — GLUCOSE, CAPILLARY
Glucose-Capillary: 100 mg/dL — ABNORMAL HIGH (ref 70–99)
Glucose-Capillary: 105 mg/dL — ABNORMAL HIGH (ref 70–99)
Glucose-Capillary: 117 mg/dL — ABNORMAL HIGH (ref 70–99)
Glucose-Capillary: 121 mg/dL — ABNORMAL HIGH (ref 70–99)
Glucose-Capillary: 125 mg/dL — ABNORMAL HIGH (ref 70–99)
Glucose-Capillary: 132 mg/dL — ABNORMAL HIGH (ref 70–99)
Glucose-Capillary: 97 mg/dL (ref 70–99)

## 2020-01-16 MED ORDER — SACUBITRIL-VALSARTAN 24-26 MG PO TABS
1.0000 | ORAL_TABLET | Freq: Two times a day (BID) | ORAL | Status: DC
  Administered 2020-01-16 – 2020-01-17 (×3): 1 via ORAL
  Filled 2020-01-16 (×3): qty 1

## 2020-01-16 MED ORDER — EMPAGLIFLOZIN 10 MG PO TABS
10.0000 mg | ORAL_TABLET | Freq: Every day | ORAL | Status: DC
  Administered 2020-01-16 – 2020-01-17 (×2): 10 mg via ORAL
  Filled 2020-01-16 (×2): qty 1

## 2020-01-16 MED ORDER — CARVEDILOL 3.125 MG PO TABS
3.1250 mg | ORAL_TABLET | Freq: Two times a day (BID) | ORAL | Status: DC
  Administered 2020-01-16 – 2020-01-17 (×2): 3.125 mg via ORAL
  Filled 2020-01-16 (×2): qty 1

## 2020-01-16 NOTE — Progress Notes (Signed)
Heart Failure Stewardship Pharmacist Progress Note   PCP: [name], [name]), MD (Inactive) PCP-Cardiologist: Skeet Latch, MD    HPI:  79 yo M with PMH of HLD, chronic LBBB, type 2 diabetes, GERD, esophageal cancer, and former tobacco abuser. He presented to the ED on 01/13/20 with worsening shortness of breath and nausea. An ECHO was done on 01/14/20 and LVEF is 25%. R/LHC done on 10/18 showed nonobstructive CAD with mildly elevated filling pressures.  Current HF Medications: Carvedilol 3.125 mg BID Entresto 24/26 mg BID Jardiance 10 mg daily  Prior to admission HF Medications: None  Pertinent Lab Values: . 10/19 labs pending . Serum creatinine 1.00, BUN 7, Potassium 3.5, Sodium 138  Vital Signs: . Weight: 195 lbs (admission weight: 206 lbs) . Blood pressure: 150-160/70-100s  . Heart rate: 60-70s   Medication Assistance / Insurance Benefits Check: Does the patient have prescription insurance?  Yes Type of insurance plan: [employer] Medicare  Does the patient qualify for medication assistance through manufacturers or grants?   Pending . Eligible grants and/or patient assistance programs: pending . Medication assistance applications in progress: none  . Medication assistance applications approved: none Approved medication assistance renewals will be completed by: Dr. Blenda Mounts office  Outpatient Pharmacy:  [facility] Is the patient willing to use [facility] at discharge? Yes Is the patient willing to transition their outpatient pharmacy to utilize a [facility] outpatient pharmacy?   Pending    Assessment: 1. Acute systolic CHF (EF 12%), due to NICM. R/LHC on 01/15/20 - nonobstructive CAD and mildly elevated filling pressures. NYHA class II symptoms. - Agree with starting carvedilol 3.125 mg BID - Agree with starting Entresto 24/26 mg BID - Agree with starting Jardiance 10 mg daily - Consider starting  spironolactone pending BP and SCr trends - Hydralazine/nitrate stopped. Consider restarting if BP needs additional therapy after titration of other GDMT meds   Plan: 1) Medication changes recommended at this time: - Agree with medication changes as above  2) Patient assistance application(s): - None pending  3)  Education  - To be completed prior to discharge  Kerby Nora, PharmD, BCPS Heart Failure Stewardship Pharmacist Phone [phone]

## 2020-01-16 NOTE — TOC Benefit Eligibility Note (Signed)
Transition of Care [facility]) Benefit Eligibility Note    Patient Details  Name: Juan [name] MRN: [id number] Date of Birth: [birth year]   Medication/Dose: ENTREST  24 TO 26 MG BID  Covered?: Yes  Tier: 3 Drug  Prescription Coverage [facility]   and    [name] with Person/Company/Phone Number:: KEITH   @  [employer] # [phone]  Co-Pay: $ 45.00     Deductible: Unmet       Memory Argue Phone Number: 01/16/2020, 12:17 PM

## 2020-01-16 NOTE — Progress Notes (Signed)
Progress Note  Patient Name: Juan [name] Date of Encounter: 01/16/2020  [facility] Cardiologist: Skeet Latch, MD   Subjective  Doing well. No chest pain or SOB. Eating breakfast this morning.   RHC/LHC: Mild-to-moderate non-obstructive CAD; normal RAP, normal mPAP, PCWP 21mmHg, CI 1.9 with CO 4.0 Inpatient Medications    Scheduled Meds: . amitriptyline  10 mg Oral QHS  . aspirin EC  81 mg Oral Daily  . atorvastatin  80 mg Oral q1800  . celecoxib  200 mg Oral Daily  . enoxaparin (LOVENOX) injection  40 mg Subcutaneous Q24H  . fluticasone furoate-vilanterol  1 puff Inhalation Daily  . gabapentin  300 mg Oral BID  . hydrALAZINE  25 mg Oral Q8H  . insulin aspart  0-9 Units Subcutaneous Q4H  . isosorbide mononitrate  30 mg Oral Daily  . pantoprazole  40 mg Oral Daily  . sodium chloride flush  3 mL Intravenous Q12H  . sodium chloride flush  3 mL Intravenous Q12H   Continuous Infusions: . sodium chloride     PRN Meds: sodium chloride, acetaminophen, albuterol, fluticasone, hydrALAZINE, morphine injection, nitroGLYCERIN, ondansetron (ZOFRAN) IV, sodium chloride flush   Vital Signs    Vitals:   01/15/20 1800 01/15/20 1914 01/16/20 0456 01/16/20 0816  BP: (!) 139/94 (!) 148/96 (!) 157/67   Pulse: (!) 56 64 (!) 50   Resp:  17 16   Temp:  97.7 F (36.5 C) 97.8 F (36.6 C)   TempSrc:  Oral Oral   SpO2:  100% 96% 95%  Weight:   88.5 kg   Height:        Intake/Output Summary (Last 24 hours) at 01/16/2020 0938 Last data filed at 01/15/2020 2131 Gross per 24 hour  Intake 243 ml  Output 625 ml  Net -382 ml   Last 3 Weights 01/16/2020 01/15/2020 01/14/2020  Weight (lbs) 195 lb 1.6 oz 198 lb 3.2 oz 198 lb 1.6 oz  Weight (kg) 88.497 kg 89.903 kg 89.858 kg      Telemetry    Sinus rhythm, sinus brady with LBBB, 1 degree AVB, PAC and occasional PVCs - Personally Reviewed  ECG   Sinus rhythm with LBBB HR 70s this AM - Personally Reviewed  Physical Exam    GEN: No acute distress.   Neck: No JVD Cardiac: RRR, no murmurs, rubs, or gallops.  Respiratory: Clear to auscultation bilaterally. GI: Soft, nontender, non-distended, +BS MS: No edema; No deformity. Neuro:  Nonfocal  Psych: Normal affect   Labs    High Sensitivity Troponin:   Recent Labs  Lab 01/13/20 1105 01/13/20 1306 01/13/20 1709  TROPONINIHS 22* 25* 21*      Chemistry Recent Labs  Lab 01/09/20 1056 01/09/20 1056 01/13/20 1105 01/13/20 1105 01/14/20 1321 01/15/20 1539 01/15/20 1542  NA 142   < > 142   < > 138 141 140  K 4.0   < > 3.3*   < > 3.5 3.5 3.5  CL 107  --  108  --  105  --   --   CO2 30  --  26  --  21*  --   --   GLUCOSE 104*  --  123*  --  164*  --   --   BUN 7*  --  9  --  7*  --   --   CREATININE 0.99  --  1.00  --  1.00  --   --   CALCIUM 8.8*  --  8.5*  --  8.9  --   --   PROT 6.8  --   --   --   --   --   --   ALBUMIN 3.7  --   --   --   --   --   --   AST 23  --   --   --   --   --   --   ALT 16  --   --   --   --   --   --   ALKPHOS 69  --   --   --   --   --   --   BILITOT 0.5  --   --   --   --   --   --   GFRNONAA >60  --  >60  --  >60  --   --   ANIONGAP 5  --  8  --  12  --   --    < > = values in this interval not displayed.     Hematology Recent Labs  Lab 01/09/20 1056 01/09/20 1056 01/13/20 1105 01/13/20 1105 01/14/20 1321 01/15/20 1539 01/15/20 1542  WBC 4.2  --  2.8*  --  6.1  --   --   RBC 4.87  --  4.87  --  5.44  --   --   HGB 11.4*   < > 11.2*   < > 12.6* 12.6* 12.6*  HCT 37.4*   < > 37.5*   < > 40.6 37.0* 37.0*  MCV 76.8*  --  77.0*  --  74.6*  --   --   MCH 23.4*  --  23.0*  --  23.2*  --   --   MCHC 30.5  --  29.9*  --  31.0  --   --   RDW 15.9*  --  15.6*  --  15.0  --   --   PLT 218  --  231  --  244  --   --    < > = values in this interval not displayed.    BNPNo results for input(s): BNP, PROBNP in the last 168 hours.   DDimer  Recent Labs  Lab 01/13/20 1540  DDIMER 0.42     Radiology     CARDIAC CATHETERIZATION  Result Date: 01/15/2020 Conclusions: 1. Mild to moderate, non-obstructive coronary artery disease. 2. Upper normal to mildly elevated left heart filling pressures. 3. Normal right heart and pulmonary artery pressures. 4. Moderately reduced Fick cardiac output/index. Recommendations: 1. Escalate evidence based heart failure therapy as tolerated for management of non-ischemic cardiomyopathy. 2. Medical therapy and risk factor modification to prevent progression of coronary artery disease. Nelva Bush, MD [facility]   ECHOCARDIOGRAM COMPLETE  Result Date: 01/14/2020    ECHOCARDIOGRAM REPORT   Patient Name:   Juan [name] Date of Exam: 01/14/2020 Medical Rec #:  [id number]  Height:       69.0 in Accession #:    [id number] Weight:       198.1 lb Date of Birth:  [birth year]  BSA:          2.058 m Patient Age:    79 years   BP:           161/97 mmHg Patient Gender: M          HR:           57 bpm. Exam Location:  Inpatient Procedure: 2D Echo, Cardiac Doppler, Color  Doppler and Intracardiac            Opacification Agent Indications:    Chest pain                 LBBB                 Esophageal cancer  History:        Patient has no prior history of Echocardiogram examinations.                 Arrythmias:LBBB, Signs/Symptoms:Chest Pain and Shortness of                 Breath; Risk Factors:Diabetes. H/O esophageal cancer.  Sonographer:    Clayton Lefort RDCS (AE) Referring Phys: [id number] GAYATRI A [place]  1. Left ventricular ejection fraction, by estimation, is 25%. The left ventricle has severely decreased function. The left ventricle demonstrates regional wall motion abnormalities (see scoring diagram/findings for description). There is moderate left ventricular hypertrophy. Left ventricular diastolic parameters are consistent with Grade II diastolic dysfunction (pseudonormalization).  2. Right ventricular systolic function is normal. The right ventricular size is normal.  Tricuspid regurgitation signal is inadequate for assessing PA pressure.  3. Left atrial size was mild to moderately dilated.  4. The mitral valve is grossly normal. Mild mitral valve regurgitation. No evidence of mitral stenosis.  5. The aortic valve is tricuspid. There is mild calcification of the aortic valve. There is mild thickening of the aortic valve. Aortic valve regurgitation is trivial. No aortic stenosis is present.  6. The inferior vena cava is normal in size with greater than 50% respiratory variability, suggesting right atrial pressure of 3 mmHg. Conclusion(s)/Recommendation(s): No left ventricular mural or apical thrombus/thrombi. FINDINGS  Left Ventricle: Left ventricular ejection fraction, by estimation, is 25%. The left ventricle has severely decreased function. The left ventricle demonstrates regional wall motion abnormalities. Definity contrast agent was given IV to delineate the left  ventricular endocardial borders. The left ventricular internal cavity size was normal in size. There is moderate left ventricular hypertrophy. Left ventricular diastolic parameters are consistent with Grade II diastolic dysfunction (pseudonormalization).  LV Wall Scoring: The entire anterior septum and mid inferoseptal segment are akinetic. The apical lateral segment, apical anterior segment, basal anterior segment, apical inferior segment, and basal inferoseptal segment are hypokinetic. Right Ventricle: The right ventricular size is normal. No increase in right ventricular wall thickness. Right ventricular systolic function is normal. Tricuspid regurgitation signal is inadequate for assessing PA pressure. Left Atrium: Left atrial size was mild to moderately dilated. Right Atrium: Right atrial size was normal in size. Pericardium: There is no evidence of pericardial effusion. Mitral Valve: The mitral valve is grossly normal. Mild mitral valve regurgitation. No evidence of mitral valve stenosis. Tricuspid Valve: The  tricuspid valve is normal in structure. Tricuspid valve regurgitation is trivial. No evidence of tricuspid stenosis. Aortic Valve: The aortic valve is tricuspid. There is mild calcification of the aortic valve. There is mild thickening of the aortic valve. Aortic valve regurgitation is trivial. No aortic stenosis is present. Aortic valve mean gradient measures 3.7 mmHg. Aortic valve peak gradient measures 7.6 mmHg. Aortic valve area, by VTI measures 2.31 cm. Pulmonic Valve: The pulmonic valve was grossly normal. Pulmonic valve regurgitation is trivial. No evidence of pulmonic stenosis. Aorta: The aortic root is normal in size and structure. Venous: The inferior vena cava is normal in size with greater than 50% respiratory variability, suggesting right atrial pressure of 3 mmHg. IAS/Shunts:  No atrial level shunt detected by color flow Doppler.  LEFT VENTRICLE PLAX 2D LVIDd:         4.70 cm LVIDs:         4.20 cm LV PW:         1.40 cm LV IVS:        1.60 cm LVOT diam:     2.10 cm LV SV:         58 LV SV Index:   28 LVOT Area:     3.46 cm  RIGHT VENTRICLE            IVC RV Basal diam:  3.60 cm    IVC diam: 1.60 cm RV S prime:     9.68 cm/s TAPSE (M-mode): 2.7 cm LEFT ATRIUM             Index       RIGHT ATRIUM           Index LA diam:        4.40 cm 2.14 cm/m  RA Area:     15.30 cm LA Vol (A2C):   82.2 ml 39.95 ml/m RA Volume:   34.90 ml  16.96 ml/m LA Vol (A4C):   84.5 ml 41.07 ml/m LA Biplane Vol: 85.7 ml 41.65 ml/m  AORTIC VALVE AV Area (Vmax):    2.35 cm AV Area (Vmean):   2.38 cm AV Area (VTI):     2.31 cm AV Vmax:           138.00 cm/s AV Vmean:          89.933 cm/s AV VTI:            0.252 m AV Peak Grad:      7.6 mmHg AV Mean Grad:      3.7 mmHg LVOT Vmax:         93.77 cm/s LVOT Vmean:        61.733 cm/s LVOT VTI:          0.168 m LVOT/AV VTI ratio: 0.67  AORTA Ao Root diam: 2.90 cm Ao Asc diam:  3.30 cm MITRAL VALVE MV Area (PHT): 2.76 cm    SHUNTS MV Decel Time: 275 msec    Systemic VTI:  0.17 m  MV E velocity: 91.70 cm/s  Systemic Diam: 2.10 cm MV A velocity: 73.70 cm/s MV E/A ratio:  1.24 Cherlynn Kaiser MD Electronically signed by Cherlynn Kaiser MD Signature Date/Time: 01/14/2020/11:25:24 AM    Final     Cardiac Studies  LHC/RHC 01/15/20 Conclusions: 1. Mild to moderate, non-obstructive coronary artery disease. 2. Upper normal to mildly elevated left heart filling pressures. 3. Normal right heart and pulmonary artery pressures. 4. Moderately reduced Fick cardiac output/index.  Recommendations: 1. Escalate evidence based heart failure therapy as tolerated for management of non-ischemic cardiomyopathy. 2. Medical therapy and risk factor modification to prevent progression of coronary artery disease.   Right Heart Pressures RA (mean): 4 mmHg RV (S/EDP): 34/4 mmHg PA (S/D, mean): 33/15 (21) mmHg PCWP (mean): 18 mmHg  Ao sat: 98% PA sat: 58%  Fick CO: 4.0 L/min Fick CI: 1.9 L/min/m^2    Left Main  Vessel is large. Vessel is angiographically normal.  Left Anterior Descending  The vessel exhibits minimal luminal irregularities.  First Diagonal Branch  Vessel is small in size.  Second Diagonal Branch  Vessel is small in size.  Third Diagonal Branch  Vessel is large in size.  3rd Diag lesion is 20% stenosed.  Left Circumflex  Vessel is large.  First Obtuse Marginal Branch  Vessel is small in size.  Second Obtuse Marginal Branch  Vessel is large in size.  2nd Mrg lesion is 30% stenosed.  Third Obtuse Marginal Branch  Vessel is small in size.  Fourth Obtuse Marginal Branch  Vessel is large in size.  Right Coronary Artery  Vessel is large.  Prox RCA lesion is 30% stenosed.  Prox RCA to Mid RCA lesion is 30% stenosed.  Right Posterior Descending Artery  Vessel is small in size.  RPDA lesion is 20% stenosed.  Right Posterior Atrioventricular Artery  Vessel is large in size.     TTE 01/15/20: IMPRESSIONS  1. Left ventricular ejection fraction, by  estimation, is 25%. The left  ventricle has severely decreased function. The left ventricle demonstrates  regional wall motion abnormalities (see scoring diagram/findings for  description). There is moderate left  ventricular hypertrophy. Left ventricular diastolic parameters are  consistent with Grade II diastolic dysfunction (pseudonormalization).  2. Right ventricular systolic function is normal. The right ventricular  size is normal. Tricuspid regurgitation signal is inadequate for assessing  PA pressure.  3. Left atrial size was mild to moderately dilated.  4. The mitral valve is grossly normal. Mild mitral valve regurgitation.  No evidence of mitral stenosis.  5. The aortic valve is tricuspid. There is mild calcification of the  aortic valve. There is mild thickening of the aortic valve. Aortic valve  regurgitation is trivial. No aortic stenosis is present.  6. The inferior vena cava is normal in size with greater than 50%  respiratory variability, suggesting right atrial pressure of 3 mmHg.   Conclusion(s)/Recommendation(s): No left ventricular mural or apical  thrombus/thrombi.   Patient Profile     79 y.o. male with a PMH of HLD, chronic LBBB, DM type 2, GERD, esophageal cancer s/p chemo/XRT in 2017 (currently in remission), and former tobacco abuse, who presented with chest pain found to have LVEF 25% with regional WMA, however cath with nonobstructive CAD. Now undergoing medical optimization for dilated CM.  Assessment & Plan    #Acute HFrEF secondary to non-ischemic cardiomyopathy: LHC with no obstructive CAD consistent with dilated, non-ischemic CM. Filling pressures normal. CI low at 1.9. Will need aggressive medical management -Start entresto 24/26mg  today -Start low dose coreg 3.125mg  as HR 70s this AM -Start spironolactone tomorrow pending renal function -Continue ASA 81mg  daily -Continue atorvastatin 80mg  daily -Start empagliflozin 10mg  daily -I/Os and daily  weights -Euvolemic with normal filling pressures; will hold diuresis -Will need to reassess EF after on GDMT to determine if needs primary prevention ICD  #HTN: -Start entresto today -Will d/c isordil/hydralazine to allow BP room for entresto/coreg  #HLD: -Continue atorvastatin 80mg  daily  #DMII: -Management per primary team -Start empagliflozin 10mg  daily  #Esophageal cancer s/p chemo/XRT #GERD -Management per primary team  Hope to discharge tomorrow on GDMT.    For questions or updates, please contact [place] Please consult [email or web address] for contact info under        Signed, Freada Bergeron, MD  01/16/2020, 9:38 AM

## 2020-01-16 NOTE — Progress Notes (Signed)
PROGRESS NOTE  Juan [name] SNK:[id number] DOB: [birth year] DOA: 01/13/2020 PCP: Juan Heck, [name]), MD (Inactive)  Brief History   79 year old man presented with intermittent left chest pain, shortness of breath, swelling.  Admitted for further evaluation of chest pain. Found to have new systolic CHF. Plan for [facility] 10/18.   A & P  Acute systolic CHF, new dx, w/ regional wall motion abnormality, chronic left bundle branch block. LVEF 25% --stable s/p right and left heart catheterization 10/18 showing nonobstructive disease --Cardiology starting empagliflozin carvedilol and Entresto. Stopping Isordil/hydralazine to allow BP room. Consider spironolactone tomorrow.    --euvolemic, no diuresis for now  Diabetes mellitus type 2 --remains stable.  Continue to holdAmaryl until discharge --continue sliding scale insulin  PMH esophageal cancer in remission --Continue to follow-up as an outpatient  Emphysema by CT --appears stable, f/u as an outpt  Aortic atherosclerosis --f/u as an outpt, continue statin  Disposition Plan:  Discussion: likely discharge home tomorrow  Status is: Inpt  Dispo: The patient is from: Home              Anticipated d/c is to: Home              Anticipated d/c date is: 1 day              Patient currently is not medically stable to d/c.  DVT prophylaxis: enoxaparin (LOVENOX) injection 40 mg Start: 01/16/20 0800   Code Status: Full Code Family Communication: none  Juan Hodgkins, MD  [facility] Direct contact: see [email or web address] (further directions at bottom of note if needed) 7PM-7AM contact night coverage as at bottom of note 01/16/2020, 5:03 PM  LOS: 2 days   Significant Hospital Events   .    Consults:  . Cardiology    Procedures:  . R/LHC: mild to moder non-obstructive CAD  Significant Diagnostic Tests:  Marland Kitchen    Micro Data:  .    Antimicrobials:  .   Interval History/Subjective  CC: f/u SOB  Feels ok, SOB at  times.  Objective   Vitals:  Vitals:   01/16/20 1144 01/16/20 1254  BP: 128/80 (!) 144/73  Pulse: 63 62  Resp: 18   Temp: 97.7 F (36.5 C)   SpO2: 95% 97%    Exam:  Physical Exam Vitals reviewed.  Constitutional:      General: He is not in acute distress.    Appearance: He is well-developed. He is not ill-appearing or toxic-appearing.     Comments: Appears mildly short of breath  Cardiovascular:     Rate and Rhythm: Normal rate and regular rhythm.     Heart sounds: Normal heart sounds. No murmur heard.  No friction rub. No gallop.      Comments: SB on telemetry Pulmonary:     Effort: Pulmonary effort is normal. No respiratory distress.     Breath sounds: Normal breath sounds. No wheezing or rales.  Neurological:     Mental Status: He is alert.  Psychiatric:        Mood and Affect: Mood normal.        Behavior: Behavior normal.      I have personally reviewed the following:   Today's Data  . CBG stable . BMP unremarkable . CBC unremarkable  Scheduled Meds: . amitriptyline  10 mg Oral QHS  . aspirin EC  81 mg Oral Daily  . atorvastatin  80 mg Oral q1800  . carvedilol  3.125 mg Oral BID WC  . celecoxib  200 mg Oral Daily  . empagliflozin  10 mg Oral Daily  . enoxaparin (LOVENOX) injection  40 mg Subcutaneous Q24H  . fluticasone furoate-vilanterol  1 puff Inhalation Daily  . gabapentin  300 mg Oral BID  . insulin aspart  0-9 Units Subcutaneous Q4H  . pantoprazole  40 mg Oral Daily  . sacubitril-valsartan  1 tablet Oral BID  . sodium chloride flush  3 mL Intravenous Q12H  . sodium chloride flush  3 mL Intravenous Q12H   Continuous Infusions: . sodium chloride      Principal Problem:   Chest pain, rule out acute myocardial infarction Active Problems:   Esophageal cancer (HCC)   LBBB (left bundle branch block)   DM2 (diabetes mellitus, type 2) (HCC)   Elevated blood-pressure reading without diagnosis of hypertension   Acute systolic CHF (congestive  heart failure) ([place])   Emphysema lung ([place])   Aortic atherosclerosis ([place])   Unstable angina ([place])   LOS: 2 days   How to contact the [facility] Attending or Consulting provider 7A - 7P or covering provider during after hours [place], for this patient?  1. Check the care team in [facility] and look for a) attending/consulting TRH provider listed and b) the [facility] team listed 2. Log into [email or web address] and use [place]'s universal password to access. If you do not have the password, please contact the hospital operator. 3. Locate the [facility] provider you are looking for under [facility] and page to a number that you can be directly reached. 4. If you still have difficulty reaching the provider, please page the [facility] (Director on Call) for the Hospitalists listed on amion for assistance.

## 2020-01-17 ENCOUNTER — Other Ambulatory Visit (HOSPITAL_COMMUNITY): Payer: Self-pay | Admitting: Internal Medicine

## 2020-01-17 ENCOUNTER — Telehealth: Payer: Self-pay | Admitting: Cardiovascular Disease

## 2020-01-17 ENCOUNTER — Other Ambulatory Visit: Payer: Self-pay | Admitting: *Deleted

## 2020-01-17 DIAGNOSIS — E119 Type 2 diabetes mellitus without complications: Secondary | ICD-10-CM | POA: Diagnosis not present

## 2020-01-17 DIAGNOSIS — R079 Chest pain, unspecified: Secondary | ICD-10-CM | POA: Diagnosis not present

## 2020-01-17 DIAGNOSIS — R0789 Other chest pain: Secondary | ICD-10-CM | POA: Diagnosis not present

## 2020-01-17 LAB — BASIC METABOLIC PANEL
Anion gap: 10 (ref 5–15)
BUN: 12 mg/dL (ref 8–23)
CO2: 23 mmol/L (ref 22–32)
Calcium: 9.1 mg/dL (ref 8.9–10.3)
Chloride: 105 mmol/L (ref 98–111)
Creatinine, Ser: 1.19 mg/dL (ref 0.61–1.24)
GFR, Estimated: 58 mL/min — ABNORMAL LOW (ref >60)
Glucose, Bld: 128 mg/dL — ABNORMAL HIGH (ref 70–99)
Potassium: 3.6 mmol/L (ref 3.5–5.1)
Sodium: 138 mmol/L (ref 135–145)

## 2020-01-17 LAB — GLUCOSE, CAPILLARY
Glucose-Capillary: 107 mg/dL — ABNORMAL HIGH (ref 70–99)
Glucose-Capillary: 123 mg/dL — ABNORMAL HIGH (ref 70–99)
Glucose-Capillary: 108 mg/dL — ABNORMAL HIGH (ref 70–99)

## 2020-01-17 MED ORDER — SPIRONOLACTONE 25 MG PO TABS
25.0000 mg | ORAL_TABLET | Freq: Every day | ORAL | 1 refills | Status: DC
Start: 2020-01-17 — End: 2020-09-05

## 2020-01-17 MED ORDER — ATORVASTATIN CALCIUM 80 MG PO TABS
80.0000 mg | ORAL_TABLET | Freq: Every day | ORAL | 1 refills | Status: DC
Start: 2020-01-17 — End: 2020-09-05

## 2020-01-17 MED ORDER — SACUBITRIL-VALSARTAN 24-26 MG PO TABS: 1.0000 | ORAL_TABLET | Freq: Two times a day (BID) | ORAL | 1 refills | Status: AC

## 2020-01-17 MED ORDER — NITROGLYCERIN 0.4 MG SL SUBL: 0.4000 mg | SUBLINGUAL_TABLET | SUBLINGUAL | 12 refills | Status: DC | PRN

## 2020-01-17 MED ORDER — SPIRONOLACTONE 25 MG PO TABS: 25.0000 mg | ORAL_TABLET | Freq: Every day | ORAL | Status: DC

## 2020-01-17 MED ORDER — CARVEDILOL 3.125 MG PO TABS
3.1250 mg | ORAL_TABLET | Freq: Two times a day (BID) | ORAL | 1 refills | Status: DC
Start: 2020-01-17 — End: 2020-09-05

## 2020-01-17 MED ORDER — EMPAGLIFLOZIN 10 MG PO TABS
10.0000 mg | ORAL_TABLET | Freq: Every day | ORAL | 0 refills | Status: DC
Start: 2020-01-18 — End: 2021-08-21

## 2020-01-17 MED FILL — ATORVASTATIN CALCIUM 80 MG: 80 | 30 days supply | Qty: 30 | Fill #0

## 2020-01-17 MED FILL — CARVEDILOL 3.125 MG TABLET: 3.125 | 30 days supply | Qty: 60 | Fill #0

## 2020-01-17 MED FILL — JARDIANCE 10 MG TABLET: 10 | 14 days supply | Qty: 14 | Fill #0

## 2020-01-17 MED FILL — ENTRESTO 24 MG-26 MG TABLET: 24-26 | 30 days supply | Qty: 60 | Fill #0

## 2020-01-17 MED FILL — SPIRONOLACTONE 25 MG TABLET: 25 | 30 days supply | Qty: 30 | Fill #0

## 2020-01-17 MED FILL — NITROGLYCERIN 0.4 MG TAB SL: 0.4 | 7 days supply | Qty: 25 | Fill #0

## 2020-01-17 NOTE — Telephone Encounter (Signed)
Pt is still admitted. When calling for TOC please inform pt that VV is scheduled 01/22/2020@11 :45 AM  [place], DPA

## 2020-01-17 NOTE — Discharge Summary (Signed)
Physician Discharge Summary  Jalynn Betzold TIR:[id number] DOB: Oct 21, 1940 DOA: 01/13/2020  PCP: Wynetta Emery Merrilee Seashore), MD (Inactive)  Admit date: 01/13/2020 Discharge date: 01/17/2020  Admitted From: Home.  Disposition:  Home.  Recommendations for Outpatient Follow-up:  1. Follow up with PCP in 1-2 weeks 2. Please obtain BMP/CBC in one week Please follow up with cardiology  In one week.   Discharge Condition: Stable.  CODE STATUS: Full code.  Diet recommendation: Heart Healthy / Carb Modified   Brief/Interim Summary: 79 year old man presented with intermittent left chest pain, shortness of breath, swelling.  Admitted for further evaluation of chest pain. Found to have new systolic CHF.Underwent R/LHC 10/18 showing non obstructive disease.   Discharge Diagnoses:  Principal Problem:   Chest pain, rule out acute myocardial infarction Active Problems:   Esophageal cancer (HCC)   LBBB (left bundle branch block)   DM2 (diabetes mellitus, type 2) (HCC)   Elevated blood-pressure reading without diagnosis of hypertension   Acute systolic CHF (congestive heart failure) (HCC)   Emphysema lung (HCC)   Aortic atherosclerosis (HCC)   Unstable angina (HCC)  & P  Acute systolic CHF, new dx, w/ regional wall motion abnormality, chronic left bundle branch block. LVEF 25% --stable s/p right and left heart catheterization 10/18 showing nonobstructive disease --Cardiology starting empagliflozin carvedilol and Entresto and spironolactone. --euvolemic, no diuresis for now. - resume aspirin and statin.  - pt denies any chest pain or sob.   Diabetes mellitus type 2 --remains stable. Resume Jardiance.  CBG (last 3)  Recent Labs    01/16/20 2358 01/17/20 0406 01/17/20 0752  GLUCAP 97 107* 51*     PMH esophageal cancer in remission --Continue to follow-up as an outpatient  Emphysema by CT --appears stable, f/u as an outpt  Aortic atherosclerosis --f/u as an outpt,  continue statin    Discharge Instructions  Discharge Instructions    Diet - low sodium heart healthy   Complete by: As directed    Discharge instructions   Complete by: As directed    Please follow up with cardiology as recommended in one week.   Increase activity slowly   Complete by: As directed      Allergies as of 01/17/2020   No Known Allergies     Medication List    STOP taking these medications   oxyCODONE-acetaminophen 10-325 MG tablet Commonly known as: PERCOCET     TAKE these medications   albuterol 108 (90 Base) MCG/ACT inhaler Commonly known as: VENTOLIN HFA Inhale 2 puffs into the lungs every 6 (six) hours as needed for wheezing or shortness of breath.   amitriptyline 10 MG tablet Commonly known as: ELAVIL Take 10 mg by mouth at bedtime.   aspirin EC 81 MG tablet Take 81 mg by mouth daily.   atorvastatin 80 MG tablet Commonly known as: LIPITOR Take 1 tablet (80 mg total) by mouth daily at 6 PM. What changed:   medication strength  how much to take  when to take this   benzonatate 100 MG capsule Commonly known as: TESSALON Take 1 capsule (100 mg total) by mouth 3 (three) times daily as needed for cough.   carvedilol 3.125 MG tablet Commonly known as: COREG Take 1 tablet (3.125 mg total) by mouth 2 (two) times daily with a meal.   celecoxib 200 MG capsule Commonly known as: CELEBREX Take 200 mg by mouth daily.   Centrum Silver 50+Men Tabs Take 1 tablet by mouth daily with breakfast.   cetirizine 10  MG tablet Commonly known as: ZyrTEC Allergy Take 1 tablet (10 mg total) by mouth daily. What changed:   when to take this  reasons to take this   empagliflozin 10 MG Tabs tablet Commonly known as: JARDIANCE Take 1 tablet (10 mg total) by mouth daily. Start taking on: January 18, 2020   fluticasone 50 MCG/ACT nasal spray Commonly known as: FLONASE Place 1 spray into both nostrils daily. What changed:   when to take  this  reasons to take this   gabapentin 300 MG capsule Commonly known as: NEURONTIN Take 300 mg by mouth in the morning and at bedtime.   glimepiride 1 MG tablet Commonly known as: AMARYL Take 1 mg by mouth daily after breakfast.   nitroGLYCERIN 0.4 MG SL tablet Commonly known as: NITROSTAT Place 1 tablet (0.4 mg total) under the tongue every 5 (five) minutes as needed for chest pain.   pantoprazole 40 MG tablet Commonly known as: PROTONIX Take 40 mg by mouth daily before breakfast.   sacubitril-valsartan 24-26 MG Commonly known as: ENTRESTO Take 1 tablet by mouth 2 (two) times daily.   spironolactone 25 MG tablet Commonly known as: ALDACTONE Take 1 tablet (25 mg total) by mouth daily.   Wixela Inhub 250-50 MCG/DOSE Aepb Generic drug: Fluticasone-Salmeterol Inhale 1 puff into the lungs daily.       Follow-up Information    [place], [name]), MD. Schedule an appointment as soon as possible for a visit in 1 week(s).   Specialty: Internal Medicine Contact information: [address] [place] [address] [phone]        Skeet Latch, MD .   Specialty: Cardiology Contact information: [address] [place] [address] [phone]              No Known Allergies  Consultations:  cardiology   Procedures/Studies: DG Chest 2 View  Result Date: 01/13/2020 CLINICAL DATA:  79 year old male with chest pain and shortness of breath. EXAM: CHEST - 2 VIEW COMPARISON:  None. FINDINGS: The heart size and mediastinal contours are within normal limits. Both lungs are clear. Multilevel degenerative changes of the visualized thoracic spine. No acute osseous abnormality. IMPRESSION: No active cardiopulmonary disease. Electronically Signed   By: Ruthann Cancer MD   On: 01/13/2020 12:36   CT Chest W Contrast  Result Date: 01/13/2020 CLINICAL DATA:  Left-sided chest pain, shortness of breath, nausea, history of esophageal  cancer EXAM: CT CHEST, ABDOMEN, AND PELVIS WITH CONTRAST TECHNIQUE: Multidetector CT imaging of the chest, abdomen and pelvis was performed following the standard protocol during bolus administration of intravenous contrast. CONTRAST:  166mL OMNIPAQUE IOHEXOL 300 MG/ML  SOLN COMPARISON:  07/15/2019 FINDINGS: CT CHEST FINDINGS Cardiovascular: Aortic atherosclerosis. Normal heart size. Left coronary artery calcifications. No pericardial effusion. Mediastinum/Nodes: Prominent mediastinal lymph nodes, unchanged compared to prior examination. Thyroid gland, trachea, and esophagus demonstrate no significant findings. Lungs/Pleura: Mild centrilobular emphysema. Diffuse bilateral bronchial wall thickening. Mild interlobular septal thickening and trace bilateral pleural effusions. No pleural effusion or pneumothorax. Musculoskeletal: No chest wall mass or suspicious bone lesions identified. CT ABDOMEN PELVIS FINDINGS Hepatobiliary: No solid liver abnormality is seen. No gallstones, gallbladder wall thickening, or biliary dilatation. Pancreas: Unremarkable. No pancreatic ductal dilatation or surrounding inflammatory changes. Spleen: Normal in size without significant abnormality. Adrenals/Urinary Tract: Adrenal glands are unremarkable. Small bilateral nonobstructive renal calculi. No hydronephrosis. Bladder is unremarkable. Stomach/Bowel: Stomach is within normal limits. Appendix appears normal. No evidence of bowel wall thickening, distention, or inflammatory changes.  Vascular/Lymphatic: No significant vascular findings are present. No enlarged abdominal or pelvic lymph nodes. Reproductive: No mass or other abnormality. Other: No abdominal wall hernia or abnormality. No abdominopelvic ascites. Musculoskeletal: No acute or significant osseous findings. Left hip total arthroplasty. IMPRESSION: 1. Mild interlobular septal thickening and trace bilateral pleural effusions, consistent with pulmonary edema. 2. Mild centrilobular  emphysema. Emphysema (ICD10-J43.9). 3. Diffuse bilateral bronchial wall thickening, consistent with nonspecific infectious or inflammatory bronchitis. 4. No evidence of esophageal mass or other specific evidence of malignancy on this examination. 5. Prominent mediastinal lymph nodes, unchanged compared to prior examination, nonspecific. 6. Coronary artery disease. Aortic Atherosclerosis (ICD10-I70.0). 7. Nonobstructive bilateral nephrolithiasis. Electronically Signed   By: Eddie Candle M.D.   On: 01/13/2020 20:54   CT Abdomen Pelvis W Contrast  Result Date: 01/13/2020 CLINICAL DATA:  Left-sided chest pain, shortness of breath, nausea, history of esophageal cancer EXAM: CT CHEST, ABDOMEN, AND PELVIS WITH CONTRAST TECHNIQUE: Multidetector CT imaging of the chest, abdomen and pelvis was performed following the standard protocol during bolus administration of intravenous contrast. CONTRAST:  154mL OMNIPAQUE IOHEXOL 300 MG/ML  SOLN COMPARISON:  07/15/2019 FINDINGS: CT CHEST FINDINGS Cardiovascular: Aortic atherosclerosis. Normal heart size. Left coronary artery calcifications. No pericardial effusion. Mediastinum/Nodes: Prominent mediastinal lymph nodes, unchanged compared to prior examination. Thyroid gland, trachea, and esophagus demonstrate no significant findings. Lungs/Pleura: Mild centrilobular emphysema. Diffuse bilateral bronchial wall thickening. Mild interlobular septal thickening and trace bilateral pleural effusions. No pleural effusion or pneumothorax. Musculoskeletal: No chest wall mass or suspicious bone lesions identified. CT ABDOMEN PELVIS FINDINGS Hepatobiliary: No solid liver abnormality is seen. No gallstones, gallbladder wall thickening, or biliary dilatation. Pancreas: Unremarkable. No pancreatic ductal dilatation or surrounding inflammatory changes. Spleen: Normal in size without significant abnormality. Adrenals/Urinary Tract: Adrenal glands are unremarkable. Small bilateral nonobstructive  renal calculi. No hydronephrosis. Bladder is unremarkable. Stomach/Bowel: Stomach is within normal limits. Appendix appears normal. No evidence of bowel wall thickening, distention, or inflammatory changes. Vascular/Lymphatic: No significant vascular findings are present. No enlarged abdominal or pelvic lymph nodes. Reproductive: No mass or other abnormality. Other: No abdominal wall hernia or abnormality. No abdominopelvic ascites. Musculoskeletal: No acute or significant osseous findings. Left hip total arthroplasty. IMPRESSION: 1. Mild interlobular septal thickening and trace bilateral pleural effusions, consistent with pulmonary edema. 2. Mild centrilobular emphysema. Emphysema (ICD10-J43.9). 3. Diffuse bilateral bronchial wall thickening, consistent with nonspecific infectious or inflammatory bronchitis. 4. No evidence of esophageal mass or other specific evidence of malignancy on this examination. 5. Prominent mediastinal lymph nodes, unchanged compared to prior examination, nonspecific. 6. Coronary artery disease. Aortic Atherosclerosis (ICD10-I70.0). 7. Nonobstructive bilateral nephrolithiasis. Electronically Signed   By: Eddie Candle M.D.   On: 01/13/2020 20:54   CARDIAC CATHETERIZATION  Result Date: 01/15/2020 Conclusions: 1. Mild to moderate, non-obstructive coronary artery disease. 2. Upper normal to mildly elevated left heart filling pressures. 3. Normal right heart and pulmonary artery pressures. 4. Moderately reduced Fick cardiac output/index. Recommendations: 1. Escalate evidence based heart failure therapy as tolerated for management of non-ischemic cardiomyopathy. 2. Medical therapy and risk factor modification to prevent progression of coronary artery disease. Nelva Bush, MD [facility]   ECHOCARDIOGRAM COMPLETE  Result Date: 01/14/2020    ECHOCARDIOGRAM REPORT   Patient Name:   MAXIME BECKNER Date of Exam: 01/14/2020 Medical Rec #:  [id number]  Height:       69.0 in Accession #:     [id number] Weight:       198.1 lb Date of Birth:  10/18/1940  BSA:  2.058 m Patient Age:    63 years   BP:           161/97 mmHg Patient Gender: M          HR:           57 bpm. Exam Location:  Inpatient Procedure: 2D Echo, Cardiac Doppler, Color Doppler and Intracardiac            Opacification Agent Indications:    Chest pain                 LBBB                 Esophageal cancer  History:        Patient has no prior history of Echocardiogram examinations.                 Arrythmias:LBBB, Signs/Symptoms:Chest Pain and Shortness of                 Breath; Risk Factors:Diabetes. H/O esophageal cancer.  Sonographer:    Clayton Lefort RDCS (AE) Referring Phys: [id number] GAYATRI A [place]  1. Left ventricular ejection fraction, by estimation, is 25%. The left ventricle has severely decreased function. The left ventricle demonstrates regional wall motion abnormalities (see scoring diagram/findings for description). There is moderate left ventricular hypertrophy. Left ventricular diastolic parameters are consistent with Grade II diastolic dysfunction (pseudonormalization).  2. Right ventricular systolic function is normal. The right ventricular size is normal. Tricuspid regurgitation signal is inadequate for assessing PA pressure.  3. Left atrial size was mild to moderately dilated.  4. The mitral valve is grossly normal. Mild mitral valve regurgitation. No evidence of mitral stenosis.  5. The aortic valve is tricuspid. There is mild calcification of the aortic valve. There is mild thickening of the aortic valve. Aortic valve regurgitation is trivial. No aortic stenosis is present.  6. The inferior vena cava is normal in size with greater than 50% respiratory variability, suggesting right atrial pressure of 3 mmHg. Conclusion(s)/Recommendation(s): No left ventricular mural or apical thrombus/thrombi. FINDINGS  Left Ventricle: Left ventricular ejection fraction, by estimation, is 25%. The left ventricle has  severely decreased function. The left ventricle demonstrates regional wall motion abnormalities. Definity contrast agent was given IV to delineate the left  ventricular endocardial borders. The left ventricular internal cavity size was normal in size. There is moderate left ventricular hypertrophy. Left ventricular diastolic parameters are consistent with Grade II diastolic dysfunction (pseudonormalization).  LV Wall Scoring: The entire anterior septum and mid inferoseptal segment are akinetic. The apical lateral segment, apical anterior segment, basal anterior segment, apical inferior segment, and basal inferoseptal segment are hypokinetic. Right Ventricle: The right ventricular size is normal. No increase in right ventricular wall thickness. Right ventricular systolic function is normal. Tricuspid regurgitation signal is inadequate for assessing PA pressure. Left Atrium: Left atrial size was mild to moderately dilated. Right Atrium: Right atrial size was normal in size. Pericardium: There is no evidence of pericardial effusion. Mitral Valve: The mitral valve is grossly normal. Mild mitral valve regurgitation. No evidence of mitral valve stenosis. Tricuspid Valve: The tricuspid valve is normal in structure. Tricuspid valve regurgitation is trivial. No evidence of tricuspid stenosis. Aortic Valve: The aortic valve is tricuspid. There is mild calcification of the aortic valve. There is mild thickening of the aortic valve. Aortic valve regurgitation is trivial. No aortic stenosis is present. Aortic valve mean gradient measures 3.7 mmHg. Aortic valve peak gradient measures 7.6 mmHg. Aortic  valve area, by VTI measures 2.31 cm. Pulmonic Valve: The pulmonic valve was grossly normal. Pulmonic valve regurgitation is trivial. No evidence of pulmonic stenosis. Aorta: The aortic root is normal in size and structure. Venous: The inferior vena cava is normal in size with greater than 50% respiratory variability, suggesting  right atrial pressure of 3 mmHg. IAS/Shunts: No atrial level shunt detected by color flow Doppler.  LEFT VENTRICLE PLAX 2D LVIDd:         4.70 cm LVIDs:         4.20 cm LV PW:         1.40 cm LV IVS:        1.60 cm LVOT diam:     2.10 cm LV SV:         58 LV SV Index:   28 LVOT Area:     3.46 cm  RIGHT VENTRICLE            IVC RV Basal diam:  3.60 cm    IVC diam: 1.60 cm RV S prime:     9.68 cm/s TAPSE (M-mode): 2.7 cm LEFT ATRIUM             Index       RIGHT ATRIUM           Index LA diam:        4.40 cm 2.14 cm/m  RA Area:     15.30 cm LA Vol (A2C):   82.2 ml 39.95 ml/m RA Volume:   34.90 ml  16.96 ml/m LA Vol (A4C):   84.5 ml 41.07 ml/m LA Biplane Vol: 85.7 ml 41.65 ml/m  AORTIC VALVE AV Area (Vmax):    2.35 cm AV Area (Vmean):   2.38 cm AV Area (VTI):     2.31 cm AV Vmax:           138.00 cm/s AV Vmean:          89.933 cm/s AV VTI:            0.252 m AV Peak Grad:      7.6 mmHg AV Mean Grad:      3.7 mmHg LVOT Vmax:         93.77 cm/s LVOT Vmean:        61.733 cm/s LVOT VTI:          0.168 m LVOT/AV VTI ratio: 0.67  AORTA Ao Root diam: 2.90 cm Ao Asc diam:  3.30 cm MITRAL VALVE MV Area (PHT): 2.76 cm    SHUNTS MV Decel Time: 275 msec    Systemic VTI:  0.17 m MV E velocity: 91.70 cm/s  Systemic Diam: 2.10 cm MV A velocity: 73.70 cm/s MV E/A ratio:  1.24 Cherlynn Kaiser MD Electronically signed by Cherlynn Kaiser MD Signature Date/Time: 01/14/2020/11:25:24 AM    Final     Subjective: No chest pain or sob.  Discharge Exam: Vitals:   01/17/20 0700 01/17/20 0900  BP:  (!) 144/94  Pulse:  61  Resp:    Temp:    SpO2: 98% 99%   Vitals:   01/16/20 1955 01/17/20 0403 01/17/20 0700 01/17/20 0900  BP:  (!) 144/73  (!) 144/94  Pulse:  (!) 46  61  Resp:  20    Temp: 98.4 F (36.9 C) 98.4 F (36.9 C)    TempSrc: Oral Oral    SpO2:  99% 98% 99%  Weight:  89.3 kg    Height:  General: Pt is alert, awake, not in acute distress Cardiovascular: RRR, S1/S2 +, no rubs, no  gallops Respiratory: CTA bilaterally, no wheezing, no rhonchi Abdominal: Soft, NT, ND, bowel sounds + Extremities: no edema, no cyanosis    The results of significant diagnostics from this hospitalization (including imaging, microbiology, ancillary and laboratory) are listed below for reference.     Microbiology: Recent Results (from the past 240 hour(s))  Respiratory Panel by RT PCR (Flu A&B, Covid) - Nasopharyngeal Swab     Status: None   Collection Time: 01/13/20  1:11 PM   Specimen: Nasopharyngeal Swab  Result Value Ref Range Status   SARS Coronavirus 2 by RT PCR NEGATIVE NEGATIVE Final    Comment: (NOTE) SARS-CoV-2 target nucleic acids are NOT DETECTED.  The SARS-CoV-2 RNA is generally detectable in upper respiratoy specimens during the acute phase of infection. The lowest concentration of SARS-CoV-2 viral copies this assay can detect is 131 copies/mL. A negative result does not preclude SARS-Cov-2 infection and should not be used as the sole basis for treatment or other patient management decisions. A negative result may occur with  improper specimen collection/handling, submission of specimen other than nasopharyngeal swab, presence of viral mutation(s) within the areas targeted by this assay, and inadequate number of viral copies (<131 copies/mL). A negative result must be combined with clinical observations, patient history, and epidemiological information. The expected result is Negative.  Fact Sheet for Patients:  PinkCheek.be  Fact Sheet for Healthcare Providers:  GravelBags.it  This test is no t yet approved or cleared by the [place] FDA and  has been authorized for detection and/or diagnosis of SARS-CoV-2 by FDA under an Emergency Use Authorization (EUA). This EUA will remain  in effect (meaning this test can be used) for the duration of the COVID-19 declaration under Section 564(b)(1) of the Act,  21 U.S.C. section 360bbb-3(b)(1), unless the authorization is terminated or revoked sooner.     Influenza A by PCR NEGATIVE NEGATIVE Final   Influenza B by PCR NEGATIVE NEGATIVE Final    Comment: (NOTE) The Xpert Xpress SARS-CoV-2/FLU/RSV assay is intended as an aid in  the diagnosis of influenza from Nasopharyngeal swab specimens and  should not be used as a sole basis for treatment. Nasal washings and  aspirates are unacceptable for Xpert Xpress SARS-CoV-2/FLU/RSV  testing.  Fact Sheet for Patients: PinkCheek.be  Fact Sheet for Healthcare Providers: GravelBags.it  This test is not yet approved or cleared by the [place] FDA and  has been authorized for detection and/or diagnosis of SARS-CoV-2 by  FDA under an Emergency Use Authorization (EUA). This EUA will remain  in effect (meaning this test can be used) for the duration of the  Covid-19 declaration under Section 564(b)(1) of the Act, 21  U.S.C. section 360bbb-3(b)(1), unless the authorization is  terminated or revoked. Performed at [facility], [place] [address] [place], [place] [address]      Labs: BNP (last 3 results) Recent Labs    06/24/19 0802  BNP 60.4   Basic Metabolic Panel: Recent Labs  Lab 01/13/20 1105 01/14/20 1321 01/15/20 1539 01/15/20 1542 01/16/20 1322  NA 142 138 141 140 137  K 3.3* 3.5 3.5 3.5 3.5  CL 108 105  --   --  104  CO2 26 21*  --   --  24  GLUCOSE 123* 164*  --   --  127*  BUN 9 7*  --   --  13  CREATININE 1.00 1.00  --   --  1.06  CALCIUM 8.5* 8.9  --   --  8.8*   Liver Function Tests: No results for input(s): AST, ALT, ALKPHOS, BILITOT, PROT, ALBUMIN in the last 168 hours. No results for input(s): LIPASE, AMYLASE in the last 168 hours. No results for input(s): AMMONIA in the last 168 hours. CBC: Recent Labs  Lab 01/13/20 1105 01/14/20 1321 01/15/20 1539 01/15/20 1542 01/16/20 1322  WBC 2.8* 6.1  --    --  4.6  HGB 11.2* 12.6* 12.6* 12.6* 11.4*  HCT 37.5* 40.6 37.0* 37.0* 36.8*  MCV 77.0* 74.6*  --   --  75.9*  PLT 231 244  --   --  225   Cardiac Enzymes: No results for input(s): CKTOTAL, CKMB, CKMBINDEX, TROPONINI in the last 168 hours. BNP: Invalid input(s): POCBNP CBG: Recent Labs  Lab 01/16/20 1636 01/16/20 1950 01/16/20 2358 01/17/20 0406 01/17/20 0752  GLUCAP 100* 132* 97 107* 123*   D-Dimer No results for input(s): DDIMER in the last 72 hours. Hgb A1c No results for input(s): HGBA1C in the last 72 hours. Lipid Profile Recent Labs    01/15/20 0458  CHOL 173  HDL 52  LDLCALC 103*  TRIG 89  CHOLHDL 3.3   Thyroid function studies No results for input(s): TSH, T4TOTAL, T3FREE, THYROIDAB in the last 72 hours.  Invalid input(s): FREET3 Anemia work up No results for input(s): VITAMINB12, FOLATE, FERRITIN, TIBC, IRON, RETICCTPCT in the last 72 hours. Urinalysis    Component Value Date/Time   COLORURINE STRAW (A) 07/15/2019 [place] 07/15/2019 1959   LABSPEC 1.006 07/15/2019 1959   PHURINE 7.0 07/15/2019 1959   GLUCOSEU NEGATIVE 07/15/2019 [place] NEGATIVE 07/15/2019 1959   [place] NEGATIVE 07/15/2019 1959   KETONESUR NEGATIVE 07/15/2019 1959   PROTEINUR NEGATIVE 07/15/2019 1959   UROBILINOGEN 0.2 01/29/2016 1313   NITRITE NEGATIVE 07/15/2019 1959   LEUKOCYTESUR NEGATIVE 07/15/2019 1959   Sepsis Labs Invalid input(s): PROCALCITONIN,  WBC,  LACTICIDVEN Microbiology Recent Results (from the past 240 hour(s))  Respiratory Panel by RT PCR (Flu A&B, Covid) - Nasopharyngeal Swab     Status: None   Collection Time: 01/13/20  1:11 PM   Specimen: Nasopharyngeal Swab  Result Value Ref Range Status   SARS Coronavirus 2 by RT PCR NEGATIVE NEGATIVE Final    Comment: (NOTE) SARS-CoV-2 target nucleic acids are NOT DETECTED.  The SARS-CoV-2 RNA is generally detectable in upper respiratoy specimens during the acute phase of infection. The  lowest concentration of SARS-CoV-2 viral copies this assay can detect is 131 copies/mL. A negative result does not preclude SARS-Cov-2 infection and should not be used as the sole basis for treatment or other patient management decisions. A negative result may occur with  improper specimen collection/handling, submission of specimen other than nasopharyngeal swab, presence of viral mutation(s) within the areas targeted by this assay, and inadequate number of viral copies (<131 copies/mL). A negative result must be combined with clinical observations, patient history, and epidemiological information. The expected result is Negative.  Fact Sheet for Patients:  PinkCheek.be  Fact Sheet for Healthcare Providers:  GravelBags.it  This test is no t yet approved or cleared by the [place] FDA and  has been authorized for detection and/or diagnosis of SARS-CoV-2 by FDA under an Emergency Use Authorization (EUA). This EUA will remain  in effect (meaning this test can be used) for the duration of the COVID-19 declaration under Section 564(b)(1) of the Act, 21 U.S.C. section 360bbb-3(b)(1), unless the authorization  is terminated or revoked sooner.     Influenza A by PCR NEGATIVE NEGATIVE Final   Influenza B by PCR NEGATIVE NEGATIVE Final    Comment: (NOTE) The Xpert Xpress SARS-CoV-2/FLU/RSV assay is intended as an aid in  the diagnosis of influenza from Nasopharyngeal swab specimens and  should not be used as a sole basis for treatment. Nasal washings and  aspirates are unacceptable for Xpert Xpress SARS-CoV-2/FLU/RSV  testing.  Fact Sheet for Patients: PinkCheek.be  Fact Sheet for Healthcare Providers: GravelBags.it  This test is not yet approved or cleared by the [place] FDA and  has been authorized for detection and/or diagnosis of SARS-CoV-2 by  FDA under  an Emergency Use Authorization (EUA). This EUA will remain  in effect (meaning this test can be used) for the duration of the  Covid-19 declaration under Section 564(b)(1) of the Act, 21  U.S.C. section 360bbb-3(b)(1), unless the authorization is  terminated or revoked. Performed at [facility], [place] [address] [place], [place] [address]      Time coordinating discharge: 35 minutes.   SIGNED:   Hosie Poisson, MD  [facility] 01/17/2020, 10:48 AM

## 2020-01-17 NOTE — Consult Note (Signed)
[facility] CM Inpatient Consult   01/17/2020  Raad Bajema [birth year] [id number]  [facility] [[facility]]  Accountable Care Organization [ACO] Patient: [facility]  Post hospital follow up for acute HF.  Charlesetta Shanks, RN BSN CCM [facility]  [phone] business mobile phone Toll free office [phone]  Fax number: [phone] [email or web address]

## 2020-01-17 NOTE — Plan of Care (Signed)
  Problem: Clinical Measurements: Goal: Diagnostic test results will improve Outcome: Progressing   Problem: Clinical Measurements: Goal: Will remain free from infection Outcome: Completed/Met

## 2020-01-17 NOTE — Progress Notes (Signed)
Heart Failure Stewardship Pharmacist Progress Note   PCP: [name], [name]), MD (Inactive) PCP-Cardiologist: Skeet Latch, MD    HPI:  79 yo M with PMH of HLD, chronic LBBB, type 2 diabetes, GERD, esophageal cancer, and former tobacco abuser. He presented to the ED on 01/13/20 with worsening shortness of breath and nausea. An ECHO was done on 01/14/20 and LVEF is 25%. R/LHC done on 10/18 showed nonobstructive CAD with mildly elevated filling pressures.  Current HF Medications: Carvedilol 3.125 mg BID Entresto 24/26 mg BID Jardiance 10 mg daily Spironolactone 25 mg daily  Prior to admission HF Medications: None  Pertinent Lab Values: . Serum creatinine 1.06, BUN 13, Potassium 3.5, Sodium 137  Vital Signs: . Weight: 196 lbs (admission weight: 206 lbs) . Blood pressure: 140/70-90s  . Heart rate: 60-70s   Medication Assistance / Insurance Benefits Check: Does the patient have prescription insurance?  Yes Type of insurance plan: [employer]  Outpatient Pharmacy:  Prior to admission [facility] Is the patient willing to use [facility] at discharge? Yes   Assessment: 1. Acute systolic CHF (EF 96%), due to NICM. R/LHC on 01/15/20 - nonobstructive CAD and mildly elevated filling pressures. NYHA class II symptoms. - Continue carvedilol 3.125 mg BID - Continue Entresto 24/26 mg BID - Continue Jardiance 10 mg daily - Agree with starting spironolactone 25 mg daily - Hydralazine/nitrate stopped. Consider restarting if BP needs additional therapy after titration of other GDMT meds   Plan: 1) Medication changes recommended at this time: - Agree with medication changes as above  2) Patient assistance application(s): - None pending  3)  Education  - Patient has been educated on current HF medications - Patient verbalizes understanding that over the next few months, these medication doses may change and more medications may be added to  optimize HF regimen - Patient has been educated on basic disease state pathophysiology and goals of therapy - Time spent (30 min)    Kerby Nora, PharmD, BCPS Heart Failure Stewardship Pharmacist Phone [phone]

## 2020-01-17 NOTE — Patient Outreach (Signed)
[facility]) Care Management  01/17/2020  Juan [name] 06/25/1940 [id number]   Referral received: 01/17/2020 Initial outreach : 01/17/2020 Transition of care complete by the provider's office  Telephone Assessment: Successful-enrollment  RN spoke with pt's and explained [facility] services and the purpose for today's call. Pt very receptive to the information provided. RN discussed his recent hospitalization and medical conditions. Discussed the risk involved if his condition is not monitored with daily weights and fluid restriction to prevent fluid retention. Educated pt on the HF zones and verified pt is in the GREEN zone with no noted swelling or symptoms of HF at this time. Educated on daily weights in emptying his bladder each morning before weighing.  Pt reports his weight today at 196 lbs with no symptoms however pt reports drinking 6 Pepsi a day but has agreed to wean down. Pt states he will try to drink more water and reduce possibly 3 pepsi daily. RN explained the importance of why the body needs water to survive and are vital to his organs. Much discussion on fluid intake and healthy eating habits. Pt has supportive sons and a daughter however lives alone and able to drive himself to all his medical appointments. Pt having issues with review of discharge medications. Offered consult for [facility] for medication management as pt needs assistance with managing his daily medications (pt very receptive). Informed pt to make sure all medications administered should be noted on the discharge sheet or information by his provider to administer or requested to be change or altered (pt verbalized an understanding).  Plan of care generated and discussed in detail related to any barriers or tools needed to be successful in meeting all goals discussed with interventions. Pt receptive to the plan of care and will comply. Will alert pt's provider of his disposition with [facility] services and send  the Welcome letter, Successful letter along with a [facility] calendar and EMMIs related HF. Will follow up in 2 weeks on pt's progress with managing his care.   Goals Addressed            This Visit's Progress   . [facility]-Learn More About My Health       Follow Up Date 04/30/2019   - make a list of questions - ask questions - repeat what I heard to make sure I understand - bring a list of my medicines to the visit - speak up when I don't understand    Why is this important?   The best way to learn about your health and care is by talking to the doctor and nurse.  They will answer your questions and give you information in the way that you like best.    Notes:     . [facility]-Make and Keep All Appointments       Follow Up Date 11/31/2021   - call to cancel if needed - keep a calendar with prescription refill dates - keep a calendar with appointment dates    Why is this important?   Part of staying healthy is seeing the doctor for follow-up care.  If you forget your appointments, there are some things you can do to stay on track.    Notes:     . [facility]-Track and Manage Fluids and Swelling       Follow Up Date 04/30/2019   - call office if I gain more than 2 pounds in one day or 5 pounds in one week - do ankle pumps when sitting -  keep legs up while sitting - track weight in diary - use salt in moderation - watch for swelling in feet, ankles and legs every day - weigh myself daily    Why is this important?   It is important to check your weight daily and watch how much salt and liquids you have.  It will help you to manage your heart failure.    Notes:     . [facility]-Track and Manage Symptoms       Follow Up Date 02/27/2020    - begin a heart failure diary - develop a rescue plan - follow rescue plan if symptoms flare-up - know when to call the doctor - track symptoms and what helps feel better or worse    Why is this important?   You will be able to handle your symptoms better if  you keep track of them.  Making some simple changes to your lifestyle will help.  Eating healthy is one thing you can do to take good care of yourself.    Notes:        Raina Mina, RN Care Management Coordinator [place] Office [phone]

## 2020-01-17 NOTE — Progress Notes (Signed)
Progress Note  Patient Name: Juan [name] Date of Encounter: 01/17/2020  [facility] Cardiologist: Skeet Latch, MD   Subjective  Doing well. Walked the hallway several times and climbed stairs. Anxious to get out of the hospital.   Inpatient Medications    Scheduled Meds: . amitriptyline  10 mg Oral QHS  . aspirin EC  81 mg Oral Daily  . atorvastatin  80 mg Oral q1800  . carvedilol  3.125 mg Oral BID WC  . celecoxib  200 mg Oral Daily  . empagliflozin  10 mg Oral Daily  . enoxaparin (LOVENOX) injection  40 mg Subcutaneous Q24H  . fluticasone furoate-vilanterol  1 puff Inhalation Daily  . gabapentin  300 mg Oral BID  . insulin aspart  0-9 Units Subcutaneous Q4H  . pantoprazole  40 mg Oral Daily  . sacubitril-valsartan  1 tablet Oral BID  . sodium chloride flush  3 mL Intravenous Q12H  . sodium chloride flush  3 mL Intravenous Q12H   Continuous Infusions: . sodium chloride     PRN Meds: sodium chloride, acetaminophen, albuterol, fluticasone, hydrALAZINE, nitroGLYCERIN, ondansetron (ZOFRAN) IV, sodium chloride flush   Vital Signs    Vitals:   01/16/20 1955 01/17/20 0403 01/17/20 0700 01/17/20 0900  BP:  (!) 144/73  (!) 144/94  Pulse:  (!) 46  61  Resp:  20    Temp: 98.4 F (36.9 C) 98.4 F (36.9 C)    TempSrc: Oral Oral    SpO2:  99% 98% 99%  Weight:  89.3 kg    Height:        Intake/Output Summary (Last 24 hours) at 01/17/2020 1007 Last data filed at 01/17/2020 0907 Gross per 24 hour  Intake 1337 ml  Output 300 ml  Net 1037 ml   Last 3 Weights 01/17/2020 01/16/2020 01/15/2020  Weight (lbs) 196 lb 12.8 oz 195 lb 1.6 oz 198 lb 3.2 oz  Weight (kg) 89.268 kg 88.497 kg 89.903 kg      Telemetry    Sinus brady, rare PVCs - Personally Reviewed  ECG   No new ECGs - Personally Reviewed  Physical Exam   GEN: No acute distress.   Neck: No JVD Cardiac: RRR, no murmurs, rubs, or gallops.  Respiratory: Clear to auscultation bilaterally. GI: Soft,  nontender, non-distended, +BS MS: No edema; No deformity. Neuro:  Nonfocal  Psych: Normal affect   Labs    High Sensitivity Troponin:   Recent Labs  Lab 01/13/20 1105 01/13/20 1306 01/13/20 1709  TROPONINIHS 22* 25* 21*      Chemistry Recent Labs  Lab 01/13/20 1105 01/13/20 1105 01/14/20 1321 01/14/20 1321 01/15/20 1539 01/15/20 1542 01/16/20 1322  NA 142   < > 138   < > 141 140 137  K 3.3*   < > 3.5   < > 3.5 3.5 3.5  CL 108  --  105  --   --   --  104  CO2 26  --  21*  --   --   --  24  GLUCOSE 123*  --  164*  --   --   --  127*  BUN 9  --  7*  --   --   --  13  CREATININE 1.00  --  1.00  --   --   --  1.06  CALCIUM 8.5*  --  8.9  --   --   --  8.8*  GFRNONAA >60  --  >60  --   --   --  >  60  ANIONGAP 8  --  12  --   --   --  9   < > = values in this interval not displayed.     Hematology Recent Labs  Lab 01/13/20 1105 01/13/20 1105 01/14/20 1321 01/14/20 1321 01/15/20 1539 01/15/20 1542 01/16/20 1322  WBC 2.8*  --  6.1  --   --   --  4.6  RBC 4.87  --  5.44  --   --   --  4.85  HGB 11.2*   < > 12.6*   < > 12.6* 12.6* 11.4*  HCT 37.5*   < > 40.6   < > 37.0* 37.0* 36.8*  MCV 77.0*  --  74.6*  --   --   --  75.9*  MCH 23.0*  --  23.2*  --   --   --  23.5*  MCHC 29.9*  --  31.0  --   --   --  31.0  RDW 15.6*  --  15.0  --   --   --  15.4  PLT 231  --  244  --   --   --  225   < > = values in this interval not displayed.    BNPNo results for input(s): BNP, PROBNP in the last 168 hours.   DDimer  Recent Labs  Lab 01/13/20 1540  DDIMER 0.42     Radiology    CARDIAC CATHETERIZATION  Result Date: 01/15/2020 Conclusions: 1. Mild to moderate, non-obstructive coronary artery disease. 2. Upper normal to mildly elevated left heart filling pressures. 3. Normal right heart and pulmonary artery pressures. 4. Moderately reduced Fick cardiac output/index. Recommendations: 1. Escalate evidence based heart failure therapy as tolerated for management of  non-ischemic cardiomyopathy. 2. Medical therapy and risk factor modification to prevent progression of coronary artery disease. Nelva Bush, MD [facility]    Cardiac Studies  LHC/RHC 01/15/20 Conclusions: 1. Mild to moderate, non-obstructive coronary artery disease. 2. Upper normal to mildly elevated left heart filling pressures. 3. Normal right heart and pulmonary artery pressures. 4. Moderately reduced Fick cardiac output/index.  Recommendations: 1. Escalate evidence based heart failure therapy as tolerated for management of non-ischemic cardiomyopathy. 2. Medical therapy and risk factor modification to prevent progression of coronary artery disease.   Right Heart Pressures RA (mean): 4 mmHg RV (S/EDP): 34/4 mmHg PA (S/D, mean): 33/15 (21) mmHg PCWP (mean): 18 mmHg  Ao sat: 98% PA sat: 58%  Fick CO: 4.0 L/min Fick CI: 1.9 L/min/m^2    Left Main  Vessel is large. Vessel is angiographically normal.  Left Anterior Descending  The vessel exhibits minimal luminal irregularities.  First Diagonal Branch  Vessel is small in size.  Second Diagonal Branch  Vessel is small in size.  Third Diagonal Branch  Vessel is large in size.  3rd Diag lesion is 20% stenosed.  Left Circumflex  Vessel is large.  First Obtuse Marginal Branch  Vessel is small in size.  Second Obtuse Marginal Branch  Vessel is large in size.  2nd Mrg lesion is 30% stenosed.  Third Obtuse Marginal Branch  Vessel is small in size.  Fourth Obtuse Marginal Branch  Vessel is large in size.  Right Coronary Artery  Vessel is large.  Prox RCA lesion is 30% stenosed.  Prox RCA to Mid RCA lesion is 30% stenosed.  Right Posterior Descending Artery  Vessel is small in size.  RPDA lesion is 20% stenosed.  Right Posterior Atrioventricular Artery  Vessel is  large in size.     TTE 01/15/20: IMPRESSIONS  1. Left ventricular ejection fraction, by estimation, is 25%. The left  ventricle has severely  decreased function. The left ventricle demonstrates  regional wall motion abnormalities (see scoring diagram/findings for  description). There is moderate left  ventricular hypertrophy. Left ventricular diastolic parameters are  consistent with Grade II diastolic dysfunction (pseudonormalization).  2. Right ventricular systolic function is normal. The right ventricular  size is normal. Tricuspid regurgitation signal is inadequate for assessing  PA pressure.  3. Left atrial size was mild to moderately dilated.  4. The mitral valve is grossly normal. Mild mitral valve regurgitation.  No evidence of mitral stenosis.  5. The aortic valve is tricuspid. There is mild calcification of the  aortic valve. There is mild thickening of the aortic valve. Aortic valve  regurgitation is trivial. No aortic stenosis is present.  6. The inferior vena cava is normal in size with greater than 50%  respiratory variability, suggesting right atrial pressure of 3 mmHg.   Conclusion(s)/Recommendation(s): No left ventricular mural or apical  thrombus/thrombi.   Patient Profile     79 y.o. male with a PMH of HLD, chronic LBBB, DM type 2, GERD, esophageal cancer s/p chemo/XRT in 2017 (currently in remission), and former tobacco abuse, who presented with chest pain found to have LVEF 25% with regional WMA, however cath with nonobstructive CAD. Now undergoing medical optimization for dilated CM.  Assessment & Plan    #Acute HFrEF secondary to non-ischemic cardiomyopathy: LHC with no obstructive CAD consistent with dilated, non-ischemic CM. Filling pressures normal. CI low at 1.9. Will need aggressive medical management -Continue entresto 24/26mg  daily -Tolerating coreg 3.125mg  BID -Start spironolactone 25mg  daily -Continue ASA 81mg  daily -Continue atorvastatin 80mg  daily -Continue empagliflozin 10mg  daily -I/Os and daily weights -Euvolemic with normal filling pressures; will hold diuresis -Will need to  reassess EF after on GDMT to determine if needs primary prevention ICD -Will arrange for follow-up with Cardiology within 7 days of discharge  #HTN: -Entresto as above -Start spironolactone 25mg  daily -Tolerating coreg 3.125mg  BID  #HLD: -Continue atorvastatin 80mg  daily  #DMII: -Management per primary team -Continue empagliflozin 10mg  daily  #Esophageal cancer s/p chemo/XRT #GERD -Management per primary team   Okay to discharge home from CV standpoint on current medications. Will arrange for close cardiology follow-up.     For questions or updates, please contact [facility] Please consult [email or web address] for contact info under        Signed, Freada Bergeron, MD  01/17/2020, 10:07 AM

## 2020-01-17 NOTE — Progress Notes (Signed)
D/C instructions given and reviewed. No questions asked but encouraged to call with concerns. Tele and IV removed, Tolerated well.

## 2020-01-17 NOTE — Evaluation (Signed)
Physical Therapy Evaluation Patient Details Name: Juan [name] MRN: [id number] DOB: [birth year] Today's Date: 01/17/2020   History of Present Illness  Pt is a 79 y.o. M with signiifcant PMH of esophageal CA in remission, DM type 2, who presents with intermittent left chest pain, SOB, swelling. Found to have new systolic CHF. S/p right and left heart catheterization 10/18.   Clinical Impression  Patient evaluated by Physical Therapy with no further acute PT needs identified. 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 Pt reporting mild headache, but otherwise reporting he feels close to his baseline. Pt ambulating 300 feet with no assistive device without physical difficulty. Negotiated 5 steps with left railing to simulate home set up. HR 74-98 bpm. Education provided regarding generalized walking program and work modifications if needed. All education has been completed and the patient has no further questions. No follow-up Physical Therapy or equipment needs. PT is signing off. Thank you for this referral.     Follow Up Recommendations No PT follow up;Supervision - Intermittent    Equipment Recommendations  None recommended by PT    Recommendations for Other Services       Precautions / Restrictions Precautions Precautions: None Restrictions Weight Bearing Restrictions: No      Mobility  Bed Mobility Overal bed mobility: Independent                  Transfers Overall transfer level: Independent Equipment used: None                Ambulation/Gait Ambulation/Gait assistance: Modified independent (Device/Increase time) ((increased time)) Gait Distance (Feet): 300 Feet Assistive device: None Gait Pattern/deviations: Step-through pattern;Decreased stride length Gait velocity: decreased   General Gait Details: Slightly crouched posture, decreased bilateral foot clearance (although had slippers on), no gross imbalance  Stairs Stairs:  Yes Stairs assistance: Supervision Stair Management: One rail Left Number of Stairs: 5 General stair comments: Cues for step by step pattern  Wheelchair Mobility    Modified Rankin (Stroke Patients Only)       Balance Overall balance assessment: Mild deficits observed, not formally tested                                           Pertinent Vitals/Pain Pain Assessment: Faces Faces Pain Scale: Hurts a little bit Pain Location: headache Pain Descriptors / Indicators: Headache Pain Intervention(s): Monitored during session    Home Living Family/patient expects to be discharged to:: Private residence Living Arrangements: Alone   Type of Home: House Home Access: Stairs to enter Entrance Stairs-Rails: Left Entrance Stairs-Number of Steps: 5 Home Layout: One level Home Equipment: Environmental consultant - 2 wheels;Cane - single point      Prior Function Level of Independence: Independent         Comments: Works in Estate agent        Extremity/Trunk Assessment   Upper Extremity Assessment Upper Extremity Assessment: Overall WFL for tasks assessed    Lower Extremity Assessment Lower Extremity Assessment: Overall WFL for tasks assessed       Communication   Communication: No difficulties  Cognition Arousal/Alertness: Awake/alert Behavior During Therapy: WFL for tasks assessed/performed Overall Cognitive Status: Within Functional Limits for tasks assessed  General Comments      Exercises     Assessment/Plan    PT Assessment Patent does not need any further PT services  PT Problem List         PT Treatment Interventions      PT Goals (Current goals can be found in the Care Plan section)  Acute Rehab PT Goals Patient Stated Goal: return to work PT Goal Formulation: All assessment and education complete, DC therapy    Frequency     Barriers to discharge         Co-evaluation               AM-PAC PT "6 Clicks" Mobility  Outcome Measure Help needed turning from your back to your side while in a flat bed without using bedrails?: None Help needed moving from lying on your back to sitting on the side of a flat bed without using bedrails?: None Help needed moving to and from a bed to a chair (including a wheelchair)?: None Help needed standing up from a chair using your arms (e.g., wheelchair or bedside chair)?: None Help needed to walk in hospital room?: None Help needed climbing 3-5 steps with a railing? : None 6 Click Score: 24    End of Session   Activity Tolerance: Patient tolerated treatment well Patient left: in bed;with call bell/phone within reach   PT Visit Diagnosis: Difficulty in walking, not elsewhere classified (R26.2)    Time: 5638-7564 PT Time Calculation (min) (ACUTE ONLY): 14 min   Charges:   PT Evaluation $PT Eval Moderate Complexity: 1 Mod          Wyona Almas, PT, DPT Acute Rehabilitation Services Pager [phone] Office [phone]   Juan [name] 01/17/2020, 9:24 AM

## 2020-01-17 NOTE — Plan of Care (Signed)
  Problem: Education: Goal: Knowledge of General Education information will improve Description: Including pain rating scale, medication(s)/side effects and non-pharmacologic comfort measures Outcome: Adequate for Discharge   Problem: Health Behavior/Discharge Planning: Goal: Ability to manage health-related needs will improve Outcome: Adequate for Discharge   Problem: Clinical Measurements: Goal: Ability to maintain clinical measurements within normal limits will improve Outcome: Adequate for Discharge Goal: Diagnostic test results will improve Outcome: Adequate for Discharge Goal: Respiratory complications will improve Outcome: Adequate for Discharge Goal: Cardiovascular complication will be avoided Outcome: Adequate for Discharge   Problem: Activity: Goal: Risk for activity intolerance will decrease Outcome: Adequate for Discharge   Problem: Nutrition: Goal: Adequate nutrition will be maintained Outcome: Adequate for Discharge   Problem: Coping: Goal: Level of anxiety will decrease Outcome: Adequate for Discharge   Problem: Elimination: Goal: Will not experience complications related to bowel motility Outcome: Adequate for Discharge Goal: Will not experience complications related to urinary retention Outcome: Adequate for Discharge   Problem: Pain Managment: Goal: General experience of comfort will improve Outcome: Adequate for Discharge   Problem: Safety: Goal: Ability to remain free from injury will improve Outcome: Adequate for Discharge   Problem: Skin Integrity: Goal: Risk for impaired skin integrity will decrease Outcome: Adequate for Discharge   Problem: Education: Goal: Ability to demonstrate management of disease process will improve Outcome: Adequate for Discharge Goal: Ability to verbalize understanding of medication therapies will improve Outcome: Adequate for Discharge Goal: Individualized Educational Video(s) Outcome: Adequate for Discharge    Problem: Activity: Goal: Capacity to carry out activities will improve Outcome: Adequate for Discharge   Problem: Cardiac: Goal: Ability to achieve and maintain adequate cardiopulmonary perfusion will improve Outcome: Adequate for Discharge

## 2020-01-17 NOTE — Telephone Encounter (Signed)
Patient is currently admitted and Seth Bake with [facility] requested to have patient worked into Dr. Blythe Stanford schedule for a 1-2 week hospital follow up. Informed Seth Bake that we do not have any availability in the next 1-2 weeks. However, I did make her aware that I would forward her request to Dr. Blenda Mounts nurse. Can this patient be worked in? Seth Bake states a call back to her is not necessary unless we have additional questions. Call may be returned to patient to verify appointment if discharged.

## 2020-01-18 ENCOUNTER — Other Ambulatory Visit: Payer: Self-pay | Admitting: *Deleted

## 2020-01-18 NOTE — Patient Outreach (Signed)
Referral from Rhys Martini, RN to [facility] for Medication Assistance.

## 2020-01-18 NOTE — Telephone Encounter (Signed)
Attempt #1 to call the pt for his TOC call.  LMTCB  Pt had cath 01/15/20 Pt has My Chart video visit with A Duke PA 01/22/20    Recommendations for Outpatient Follow-up:  1. Follow up with PCP in 1-2 weeks 2. Please obtain BMP/CBC in one week Please follow up with cardiology  In one week.

## 2020-01-18 NOTE — Patient Outreach (Signed)
[facility]) Care Management  01/18/2020  Marsel Gail Feb 08, 1941 [id number]   Telephone Assessment  RN received a call from to inquiring once again on his medications indicating he missed two calls thinking it was [place] on the referral sent on yesterday. RN verified no notes in Epic. Pt pose the question about taking certain medications. RN informed pt that this information needs to be presented to his provider for clarification. RN encouraged ot to contact his provider. RN also attempted to contact pt's provider but was unsuccessful.   Based upon the confusion with his medications and newly diagnosed with HF offered a referral to [facility]. Will make referral to [facility] and explained pt needs a visual and assist with his medications along with other information received from yesterday's assessment. Pt is a diabetic with no monitoring (glucose indicate low 100's on office visits reads) however pt consumes 6 Pepsi daily with minimal H2O.   Remote provided to [facility] who will follow up with this pt to the the urgency with his medications to prevent acute events from occurring. RN updated pt on this referral and to expect a call from [facility] to further address some of his needs.  Will follow up accordingly over the next few weeks if not sooner.  Raina Mina, RN Care Management Coordinator [place] Office [phone]

## 2020-01-21 NOTE — Progress Notes (Signed)
Virtual Visit via Telephone Note   This visit type was conducted due to national recommendations for restrictions regarding the COVID-19 Pandemic (e.g. social distancing) in an effort to limit this patient's exposure and mitigate transmission in our community.  Due to his co-morbid illnesses, this patient is at least at moderate risk for complications without adequate follow up.  This format is felt to be most appropriate for this patient at this time.  The patient did not have access to video technology/had technical difficulties with video requiring transitioning to audio format only (telephone).  All issues noted in this document were discussed and addressed.  No physical exam could be performed with this format.  Please refer to the patient's chart for his  consent to telehealth for [facility].    Date:  01/22/2020   ID:  Juan [name], DOB March 12, 1941, MRN [id number] The patient was identified using 2 identifiers.  Patient Location: Home Provider Location: Office/Clinic  PCP:  [name], [name]), MD (Inactive)  Cardiologist:  Skeet Latch, MD  Electrophysiologist:  None   Evaluation Performed:  Follow-Up Visit  Chief Complaint:  New NICM  History of Present Illness:    Juan [name] is a 79 y.o. male with hyperlipidemia, chronic LBP, DM2, GERD, esophageal cancer status post chemo/XRT in 2017 in remission, and former tobacco abuse.  Coronary artery calcifications and aortic atherosclerosis noted on prior CT scans.  Seen in April 2019 for preoperative evaluation and was cleared for surgery, he has not had a formal cardiac work-up.  He was recently hospitalized on 01/13/2020 with dyspnea, pain and hypertension.  Baseline bradycardia with left bundle branch block.  Echocardiogram revealed EF of 25% with regional wall motion abnormality concerning for ischemic cardiomyopathy.  He subsequently underwent right and left heart catheterization which showed no obstructive CAD,  consistent with dilated nonischemic cardiomyopathy.  Normal filling pressures, CI low at 1.9.  Started on Entresto, low-dose Coreg, spironolactone, aspirin, 80 mg Lipitor, and Jardiance.  He presents today for TOC follow-up.  He states he is doing well. He continues to have atypical chest pain that has been occurring since before his heart cath. [place] nurse is coming to his house regularly for BP checks and to monitor fluid status. BP has been "running good" but he doesn't know the readings. No SOB, no edema, no orthopnea. His main complaint is arthritis. Weight prior to hospital was 206 lbs. He is now generally 200 lbs. Overall, visit was difficult to do virtually - he is [employer] and needs a lot of teaching. Per patient report, right radial site C/D/I, no bleeding.   The patient does not have symptoms concerning for COVID-19 infection (fever, chills, cough, or new shortness of breath).    Past Medical History:  Diagnosis Date  . Aortic atherosclerosis ([place])   . Back pain   . Barrett esophagus   . Bleeding nose   . DDD (degenerative disc disease), thoracolumbar    Moderate to severe  . Diabetes mellitus without complication ([place])   . Diverticulosis   . Esophageal cancer ([place]) dx'd 2017  . GERD (gastroesophageal reflux disease)   . Headache   . Hearing loss    Right ear  . History of umbilical hernia repair   . Idiopathic peripheral neuropathy   . Iron deficiency anemia   . LBBB (left bundle branch block) 02/11/2016  . Left hip pain    Severe degenerative changes  . Lipoma of neck    Right  . Mixed hyperlipidemia   .  PONV (postoperative nausea and vomiting)   . Reflux   . Right thyroid nodule    Past Surgical History:  Procedure Laterality Date  . BALLOON DILATION N/A 05/06/2018   Procedure: BALLOON DILATION;  Surgeon: Carol Ada, MD;  Location: [facility];  Service: Endoscopy;  Laterality: N/A;  . COLONOSCOPY    . ESOPHAGOGASTRODUODENOSCOPY (EGD) WITH PROPOFOL N/A 05/06/2018    Procedure: ESOPHAGOGASTRODUODENOSCOPY (EGD) WITH PROPOFOL;  Surgeon: Carol Ada, MD;  Location: [facility];  Service: Endoscopy;  Laterality: N/A;  . EUS N/A 12/05/2015   Procedure: UPPER ENDOSCOPIC ULTRASOUND (EUS) LINEAR;  Surgeon: Carol Ada, MD;  Location: [facility];  Service: Endoscopy;  Laterality: N/A;  . EUS N/A 03/10/2016   Procedure: UPPER ENDOSCOPIC ULTRASOUND (EUS) LINEAR;  Surgeon: Carol Ada, MD;  Location: [facility];  Service: Endoscopy;  Laterality: N/A;  . EUS N/A 01/07/2017   Procedure: UPPER ENDOSCOPIC ULTRASOUND (EUS) LINEAR;  Surgeon: Carol Ada, MD;  Location: [place];  Service: Endoscopy;  Laterality: N/A;  . HERNIA REPAIR    . IR GENERIC HISTORICAL  12/24/2015   IR FLUORO GUIDE PORT INSERTION RIGHT 12/24/2015 Arne Cleveland, MD [facility]  . IR GENERIC HISTORICAL  12/24/2015   IR US GUIDE VASC ACCESS RIGHT 12/24/2015 Arne Cleveland, MD [facility]  . IR REMOVAL TUN ACCESS W/ PORT W/O FL MOD SED  02/07/2018  . PORTA CATH INSERTION    . RIGHT/LEFT HEART CATH AND CORONARY ANGIOGRAPHY N/A 01/15/2020   Procedure: RIGHT/LEFT HEART CATH AND CORONARY ANGIOGRAPHY;  Surgeon: Nelva Bush, MD;  Location: [place] [facility];  Service: Cardiovascular;  Laterality: N/A;  . SHOULDER ARTHROSCOPY W/ SUPERIOR LABRAL ANTERIOR POSTERIOR LESION REPAIR     times 2  . TOTAL HIP ARTHROPLASTY Left 07/22/2017   Procedure: LEFT TOTAL HIP ARTHROPLASTY ANTERIOR APPROACH;  Surgeon: Rod Can, MD;  Location: [facility];  Service: Orthopedics;  Laterality: Left;  . UPPER GI ENDOSCOPY  01/07/2017     Current Meds  Medication Sig  . albuterol (VENTOLIN HFA) 108 (90 Base) MCG/ACT inhaler Inhale 2 puffs into the lungs every 6 (six) hours as needed for wheezing or shortness of breath.   Marland Kitchen amitriptyline (ELAVIL) 10 MG tablet Take 10 mg by mouth at bedtime.  Marland Kitchen aspirin EC 81 MG tablet Take 81 mg by mouth daily.  Marland Kitchen atorvastatin (LIPITOR) 80 MG tablet Take 1 tablet (80 mg total)  by mouth daily at 6 PM.  . benzonatate (TESSALON) 100 MG capsule Take 1 capsule (100 mg total) by mouth 3 (three) times daily as needed for cough.  . carvedilol (COREG) 3.125 MG tablet Take 1 tablet (3.125 mg total) by mouth 2 (two) times daily with a meal.  . celecoxib (CELEBREX) 200 MG capsule Take 200 mg by mouth daily.  . cetirizine (ZYRTEC ALLERGY) 10 MG tablet Take 1 tablet (10 mg total) by mouth daily. (Patient taking differently: Take 10 mg by mouth daily as needed for allergies or rhinitis. )  . empagliflozin (JARDIANCE) 10 MG TABS tablet Take 1 tablet (10 mg total) by mouth daily.  . fluticasone (FLONASE) 50 MCG/ACT nasal spray Place 1 spray into both nostrils daily. (Patient taking differently: Place 1 spray into both nostrils daily as needed for allergies or rhinitis. )  . gabapentin (NEURONTIN) 300 MG capsule Take 300 mg by mouth in the morning and at bedtime.   . Multiple Vitamins-Minerals (CENTRUM SILVER 50+MEN) TABS Take 1 tablet by mouth daily with breakfast.  . nitroGLYCERIN (NITROSTAT) 0.4 MG SL tablet Place  1 tablet (0.4 mg total) under the tongue every 5 (five) minutes as needed for chest pain.  . pantoprazole (PROTONIX) 40 MG tablet Take 40 mg by mouth daily before breakfast.   . sacubitril-valsartan (ENTRESTO) 24-26 MG Take 1 tablet by mouth 2 (two) times daily.  Marland Kitchen spironolactone (ALDACTONE) 25 MG tablet Take 1 tablet (25 mg total) by mouth daily.  Grant Ruts INHUB 250-50 MCG/DOSE AEPB Inhale 1 puff into the lungs daily.     Allergies:   Patient has no known allergies.   Social History   Tobacco Use  . Smoking status: Former Smoker    Packs/day: 2.00    Years: 40.00    Pack years: 80.00    Quit date: 03/31/1999    Years since quitting: 20.8  . Smokeless tobacco: Never Used  Vaping Use  . Vaping Use: Never used  Substance Use Topics  . Alcohol use: Not Currently  . Drug use: No     Family Hx: The patient's family history includes Colon cancer (age of onset: 53)  in his brother; Colon cancer (age of onset: 32) in his father; Diabetes in his sister; Heart attack in his brother and mother; Heart disease in his mother; Stroke in his sister.  ROS:   Please see the history of present illness.     All other systems reviewed and are negative.   Prior CV studies:   The following studies were reviewed today:  Right and left heart cath 01/15/20 Conclusions: 1. Mild to moderate, non-obstructive coronary artery disease. 2. Upper normal to mildly elevated left heart filling pressures. 3. Normal right heart and pulmonary artery pressures. 4. Moderately reduced Fick cardiac output/index.  Recommendations: 1. Escalate evidence based heart failure therapy as tolerated for management of non-ischemic cardiomyopathy. 2. Medical therapy and risk factor modification to prevent progression of coronary artery disease.   TTE 01/15/20: IMPRESSIONS  1. Left ventricular ejection fraction, by estimation, is 25%. The left  ventricle has severely decreased function. The left ventricle demonstrates  regional wall motion abnormalities (see scoring diagram/findings for  description). There is moderate left  ventricular hypertrophy. Left ventricular diastolic parameters are  consistent with Grade II diastolic dysfunction (pseudonormalization).  2. Right ventricular systolic function is normal. The right ventricular  size is normal. Tricuspid regurgitation signal is inadequate for assessing  PA pressure.  3. Left atrial size was mild to moderately dilated.  4. The mitral valve is grossly normal. Mild mitral valve regurgitation.  No evidence of mitral stenosis.  5. The aortic valve is tricuspid. There is mild calcification of the  aortic valve. There is mild thickening of the aortic valve. Aortic valve  regurgitation is trivial. No aortic stenosis is present.  6. The inferior vena cava is normal in size with greater than 50%  respiratory variability, suggesting  right atrial pressure of 3 mmHg.   Conclusion(s)/Recommendation(s): No left ventricular mural or apical  thrombus/thrombi.    Labs/Other Tests and Data Reviewed:    EKG:  No ECG reviewed.  Recent Labs: 06/24/2019: B Natriuretic Peptide 79.2 01/09/2020: ALT 16 01/16/2020: Hemoglobin 11.4; Platelets 225 01/17/2020: BUN 12; Creatinine, Ser 1.19; Potassium 3.6; Sodium 138   Recent Lipid Panel Lab Results  Component Value Date/Time   CHOL 173 01/15/2020 04:58 AM   TRIG 89 01/15/2020 04:58 AM   HDL 52 01/15/2020 04:58 AM   CHOLHDL 3.3 01/15/2020 04:58 AM   LDLCALC 103 (H) 01/15/2020 04:58 AM    Wt Readings from Last 3 Encounters:  01/22/20  200 lb (90.7 kg)  01/17/20 196 lb 12.8 oz (89.3 kg)  07/23/19 203 lb (92.1 kg)     Risk Assessment/Calculations:      Objective:    Vital Signs:  Ht 5\' 9"  (1.753 m)   Wt 200 lb (90.7 kg)   BMI 29.53 kg/m    VITAL SIGNS:  reviewed GEN:  no acute distress RESPIRATORY:  respirations unlabored NEURO:  alert and oriented x 3, no obvious focal deficit PSYCH:  normal affect  ASSESSMENT & PLAN:    Chronic systolic heart failure Nonischemic cardiomyopathy - 24-26 entresto, low dose BB, spironolactone - will need to repeat echo in 3 months - unable to titrate entresto because I don't have a BP reading - he was advised to get a scales, which he did, but did not have enough money to get a BP cuff - I will see if we can send him a machine with a large cuff - weight is 200 lbs, from 206 lbs prior to hospitalization - will send instructions in AVS for low sodium diet   Mild to moderate nonobstructive CAD -Continue 81 mg aspirin and 80 mg Lipitor -Risk factor modification   Hyperlipidemia with LDL goal less than 70 01/15/2020: Cholesterol 173; HDL 52; LDL Cholesterol 103; Triglycerides 89; VLDL 18 - started 80 mg Lipitor - Repeat fasting lipids in 4 weeks    Hypertension - medications as above - will try to get him a BP cuff in  the mail - will need close follow up for medication titration   He wants to go back to work. I have provided a letter to return 01/24/20. He makes picture frames.    COVID-19 Education: The signs and symptoms of COVID-19 were discussed with the patient and how to seek care for testing (follow up with PCP or arrange E-visit).  The importance of social distancing was discussed today.  Time:   Today, I have spent 26 minutes with the patient with telehealth technology discussing the above problems.     Medication Adjustments/Labs and Tests Ordered: Current medicines are reviewed at length with the patient today.  Concerns regarding medicines are outlined above.   Tests Ordered: No orders of the defined types were placed in this encounter.   Medication Changes: No orders of the defined types were placed in this encounter.   Follow Up:  In Person in 1 month(s)  Signed, Ledora Bottcher, [place]  01/22/2020 12:04 PM    [place]

## 2020-01-22 ENCOUNTER — Telehealth: Payer: Self-pay

## 2020-01-22 ENCOUNTER — Other Ambulatory Visit: Payer: Self-pay | Admitting: *Deleted

## 2020-01-22 ENCOUNTER — Telehealth (INDEPENDENT_AMBULATORY_CARE_PROVIDER_SITE_OTHER): Payer: 59 | Admitting: Physician Assistant

## 2020-01-22 ENCOUNTER — Encounter: Payer: Self-pay | Admitting: Physician Assistant

## 2020-01-22 VITALS — Ht 69.0 in | Wt 200.0 lb

## 2020-01-22 DIAGNOSIS — E785 Hyperlipidemia, unspecified: Secondary | ICD-10-CM | POA: Diagnosis not present

## 2020-01-22 DIAGNOSIS — I2510 Atherosclerotic heart disease of native coronary artery without angina pectoris: Secondary | ICD-10-CM

## 2020-01-22 DIAGNOSIS — I5022 Chronic systolic (congestive) heart failure: Secondary | ICD-10-CM | POA: Diagnosis not present

## 2020-01-22 DIAGNOSIS — I428 Other cardiomyopathies: Secondary | ICD-10-CM | POA: Diagnosis not present

## 2020-01-22 DIAGNOSIS — I10 Essential (primary) hypertension: Secondary | ICD-10-CM | POA: Diagnosis not present

## 2020-01-22 DIAGNOSIS — I700 Atherosclerosis of aorta: Secondary | ICD-10-CM | POA: Diagnosis not present

## 2020-01-22 DIAGNOSIS — E119 Type 2 diabetes mellitus without complications: Secondary | ICD-10-CM

## 2020-01-22 NOTE — Patient Instructions (Signed)
Medication Instructions:  No Changes *If you need a refill on your cardiac medications before your next appointment, please call your pharmacy*   Lab Work: No labs If you have labs (blood work) drawn today and your tests are completely normal, you will receive your results only by:  [place] (if you have MyChart) OR  A paper copy in the mail If you have any lab test that is abnormal or we need to change your treatment, we will call you to review the results.   Testing/Procedures: No Testing    Follow-Up: At [facility], you and your health needs are our priority.  As part of our continuing mission to provide you with exceptional heart care, we have created designated Provider Care Teams.  These Care Teams include your primary Cardiologist (physician) and Advanced Practice Providers (APPs -  Physician Assistants and Nurse Practitioners) who all work together to provide you with the care you need, when you need it.  We recommend signing up for the patient portal called "MyChart".  Sign up information is provided on this After Visit Summary.  MyChart is used to connect with patients for Virtual Visits (Telemedicine).  Patients are able to view lab/test results, encounter notes, upcoming appointments, etc.  Non-urgent messages can be sent to your provider as well.   To learn more about what you can do with MyChart, go to [email or web address].    Your next appointment:   3 month(s)  The format for your next appointment:   In Person  Provider:   Skeet Latch, MD   Other Instructions Follow a Low Sodium Diet 1800mg  Daily

## 2020-01-22 NOTE — Patient Outreach (Addendum)
[facility]) Care Management  01/22/2020  Juan [name] February 03, 1941 [id number]  NOTE PT IS ACTIVE [facility] COMPLEX CASE MANAGEMENT SERVICES (HF) EMMI-GENERAL DISCHARGE-RESOLVED RED ON EMMI ALERT Day #1 Date: 01/19/2020 Red Alert Reason: NO FOLLOW UP APPOINTMENT, DON'T KNOW WHO TO CALL.  OUTREACH #1 RN spoke with pt concerning the above EMMI. Pt states all resolved indicating he spoke with his primary provider this morning and all medication issues have been resolved. Pt states he has an appointment on 11/2 with his provider. Veriied pt is aware of who to call with any issues and pending appointments. Pt also states an agency has visited him in the home and completed a EKG along with other assessment over the next week. Pt not aware of the agency. RN strongly encouraged pt to leave a notebook out or obtain information of the agency entering the home. Strongly encouraged to note all individuals that come into the home. Pt verified he received the [facility] packet and calendar in the mail and will note all weights and readings for his providers to view. RN discussed remote health who will be visiting pt this week for enrollment. Encouraged pt to be receptive to this services to assist him further in managing his health (pt again receptive).  Will follow up next week with his ongoing management of care via [facility] services. Current EMMI resolved with no additional needs at this time.  Raina Mina, RN Care Management Coordinator [place] Office [phone]

## 2020-01-22 NOTE — Progress Notes (Signed)
[place]   Telephone:([phone]) [phone] Fax:(336) [phone]   Clinic Follow up Note   Patient Care Team: Deville, [name]), MD (Inactive) as PCP - General (Internal Medicine) Skeet Latch, MD as PCP - Cardiology (Cardiology) [name], MD as Consulting Physician (Hematology) Kyung Rudd, MD as Consulting Physician (Radiation Oncology) Juanita Craver, MD as Consulting Physician (Gastroenterology) Tobi Bastos, RN as Case Manager  Date of Service:  01/25/2020  CHIEF COMPLAINT: Follow up esophageal squamous cell carcinoma  SUMMARY OF ONCOLOGIC HISTORY: Oncology History Overview Note  Presented with dysphagia and odynophagia with 20-25 pounds of weight loss in about 3-4 months  Esophageal cancer [facility])   Staging form: Esophagus - Adenocarcinoma, AJCC 7th Edition   - Clinical stage from 11/13/2015: Stage IIIB (T3, N2, M0, G2) - Signed by [name], MD on 12/11/2015    Esophageal cancer ([place])  11/13/2015 Procedure   UPPER ENDOSCOPY per Dr. Collene Mares: Large fungating, friable bleeding mass in middle third of esophagus, 22cm from incisors and extended to 27cm. Non-obstructing.   11/13/2015 Pathology Results   Invasive squamous cell carcinoma; moderately differentiated   11/13/2015 Imaging   CT ABD/PELVIS: IMPRESSION:No evidence of metastatic disease in the abdomen or pelvis. 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 Aortoiliac atherosclerosis. Small bilateral inguinal hernias containing fat the   11/22/2015 Initial Diagnosis   Esophageal cancer ([place])   12/03/2015 Imaging   PET scan showed a long segment of hypermetabolic thickening in the mid esophagus consistent with esophageal carcinoma. No clear evidence of node metastasis or distant metastasis.   12/10/2015 - 01/14/2016 Radiation Therapy   Neoadjuvant radiation to his esophageal cancer   12/10/2015 - 01/15/2016 Chemotherapy   Neoadjuvant weekly carboplatin AUC 2, and Taxol 45 mg/m, with concurrent radiation.  He developed steroid-induced psychosis, and Taxol was changed to Abraxane to avoid premedication with steroids.   02/24/2016 Imaging   CT CAP with contrast showed interval improvement mid esophageal mass, no evidence for metastatic adenopathy or distant metastasis.    03/10/2016 Procedure   Repeat EGD by Dr. Benson Norway showed wall thickening in the thoracic esophagus with the tumor was. The thickness decreased from 12.8 mm to 6-7 mm. Not able to differentiate between actual tumor versus fibrosis from radiation. Some peritumoral shoddy lymph nodes.   04/07/2016 Surgery   Patient followed up with Dr. Servando Snare, patient is not a great candidate for surgery, pt also does not want surgery, mutually agreed to not pursue esophagectomy.    06/01/2016 Imaging   CT C/A/P IMPRESSION: Mild mid esophageal wall thickening without residual mass on CT. Radiation changes in the paramediastinal lung bilaterally. No evidence of recurrent or metastatic disease.   08/18/2016 -  Hospital Admission   Patient presented to the ER with SOB and complaints of chest pain   11/07/2016 Imaging   Nm Pet Image Restag IMPRESSION: Negative PET-CT. No findings for recurrent esophageal cancer or metastatic disease.   01/07/2017 Procedure   EUS by Dr. Benson Norway 01/07/17 IMPRESSION - Normal esophagus. - Normal stomach. - Normal examined duodenum. - Wall thickening was seen in the upper third of the esophagus and in the middle third of the esophagus. - One benign lymph node was visualized in the middle paraesophageal mediastinum (level 18M). - No specimens collected.   05/10/2017 Imaging   CT CAP W Contrast 05/10/17 IMPRESSION: 1. No new or progressive findings to suggest recurrent or metastatic disease in the chest or abdomen on today's study. 2.  Aortic Atherosclerois (ICD10-170.0)   01/14/2018 Imaging  01/14/2018 CT Neck IMPRESSION: 1. No evidence of cervical lymphadenopathy. 2. 5 cm right neck lipoma, mildly enlarged from  2010.   01/14/2018 Imaging   01/14/2018 CT CAP IMPRESSION: 1. Interval development of several tiny bilateral pulmonary nodules. While indeterminate and potentially related to infectious/inflammatory etiology, close attention recommended as early metastatic disease not excluded. 2. Otherwise stable exam. 3.  Emphysema. (ICD10-J43.9) 4.  Aortic Atherosclerois (ICD10-170.0)   01/25/2019 Imaging   CT AP W contrast  IMPRESSION: No evidence for residual or recurrent tumor within the abdomen or pelvis.     05/07/2019 Procedure   Upper Endoscopy by Dr Benson Norway  IMPRESSION - Benign-appearing esophageal stenosis. Dilated. - Normal stomach. - Normal examined duodenum. - No specimens collected.   01/13/2020 Imaging   CT CAP w Contrast  IMPRESSION: 1. Mild interlobular septal thickening and trace bilateral pleural effusions, consistent with pulmonary edema. 2. Mild centrilobular emphysema. Emphysema (ICD10-J43.9). 3. Diffuse bilateral bronchial wall thickening, consistent with nonspecific infectious or inflammatory bronchitis. 4. No evidence of esophageal mass or other specific evidence of malignancy on this examination. 5. Prominent mediastinal lymph nodes, unchanged compared to prior examination, nonspecific. 6. Coronary artery disease. Aortic Atherosclerosis (ICD10-I70.0). 7. Nonobstructive bilateral nephrolithiasis.        CURRENT THERAPY:  Observation  INTERVAL HISTORY:  Juan [name] is here for a follow up of esophageal cancer. He was last seen by me 6 months ago. He presents to the clinic alone.  Patient was recently hospitalized for congestive heart failure, underwent cardiac catheterization, which showed mild to moderate coronary artery disease.  No intervention was needed.  He was treated medically, and dyspnea has much improved.  He is able to carry daily activities, no orthopnea.  He denies any pain, dysphagia, odynophagia, or other symptoms.  Mild fatigue is stable.  He  has been checking his weight closely at home. All other systems were reviewed with the patient and are negative.  MEDICAL HISTORY:  Past Medical History:  Diagnosis Date  . Aortic atherosclerosis ([place])   . Back pain   . Barrett esophagus   . Bleeding nose   . DDD (degenerative disc disease), thoracolumbar    Moderate to severe  . Diabetes mellitus without complication ([place])   . Diverticulosis   . Esophageal cancer ([place]) dx'd 2017  . GERD (gastroesophageal reflux disease)   . Headache   . Hearing loss    Right ear  . History of umbilical hernia repair   . Idiopathic peripheral neuropathy   . Iron deficiency anemia   . LBBB (left bundle branch block) 02/11/2016  . Left hip pain    Severe degenerative changes  . Lipoma of neck    Right  . Mixed hyperlipidemia   . PONV (postoperative nausea and vomiting)   . Reflux   . Right thyroid nodule     SURGICAL HISTORY: Past Surgical History:  Procedure Laterality Date  . BALLOON DILATION N/A 05/06/2018   Procedure: BALLOON DILATION;  Surgeon: Carol Ada, MD;  Location: [facility];  Service: Endoscopy;  Laterality: N/A;  . COLONOSCOPY    . ESOPHAGOGASTRODUODENOSCOPY (EGD) WITH PROPOFOL N/A 05/06/2018   Procedure: ESOPHAGOGASTRODUODENOSCOPY (EGD) WITH PROPOFOL;  Surgeon: Carol Ada, MD;  Location: [facility];  Service: Endoscopy;  Laterality: N/A;  . EUS N/A 12/05/2015   Procedure: UPPER ENDOSCOPIC ULTRASOUND (EUS) LINEAR;  Surgeon: Carol Ada, MD;  Location: [facility];  Service: Endoscopy;  Laterality: N/A;  . EUS N/A 03/10/2016   Procedure: UPPER ENDOSCOPIC ULTRASOUND (EUS) LINEAR;  Surgeon:  Carol Ada, MD;  Location: [facility];  Service: Endoscopy;  Laterality: N/A;  . EUS N/A 01/07/2017   Procedure: UPPER ENDOSCOPIC ULTRASOUND (EUS) LINEAR;  Surgeon: Carol Ada, MD;  Location: [facility];  Service: Endoscopy;  Laterality: N/A;  . HERNIA REPAIR    . IR GENERIC HISTORICAL  12/24/2015   IR FLUORO GUIDE PORT  INSERTION RIGHT 12/24/2015 Arne Cleveland, MD [facility]  . IR GENERIC HISTORICAL  12/24/2015   IR US GUIDE VASC ACCESS RIGHT 12/24/2015 Arne Cleveland, MD [facility]  . IR REMOVAL TUN ACCESS W/ PORT W/O FL MOD SED  02/07/2018  . PORTA CATH INSERTION    . RIGHT/LEFT HEART CATH AND CORONARY ANGIOGRAPHY N/A 01/15/2020   Procedure: RIGHT/LEFT HEART CATH AND CORONARY ANGIOGRAPHY;  Surgeon: Nelva Bush, MD;  Location: [facility];  Service: Cardiovascular;  Laterality: N/A;  . SHOULDER ARTHROSCOPY W/ SUPERIOR LABRAL ANTERIOR POSTERIOR LESION REPAIR     times 2  . TOTAL HIP ARTHROPLASTY Left 07/22/2017   Procedure: LEFT TOTAL HIP ARTHROPLASTY ANTERIOR APPROACH;  Surgeon: Rod Can, MD;  Location: [facility];  Service: Orthopedics;  Laterality: Left;  . UPPER GI ENDOSCOPY  01/07/2017    I have reviewed the social history and family history with the patient and they are unchanged from previous note.  ALLERGIES:  has No Known Allergies.  MEDICATIONS:  Current Outpatient Medications  Medication Sig Dispense Refill  . albuterol (VENTOLIN HFA) 108 (90 Base) MCG/ACT inhaler Inhale 2 puffs into the lungs every 6 (six) hours as needed for wheezing or shortness of breath.     Marland Kitchen amitriptyline (ELAVIL) 10 MG tablet Take 10 mg by mouth at bedtime.    Marland Kitchen aspirin EC 81 MG tablet Take 81 mg by mouth daily.    Marland Kitchen atorvastatin (LIPITOR) 80 MG tablet Take 1 tablet (80 mg total) by mouth daily at 6 PM. 30 tablet 1  . benzonatate (TESSALON) 100 MG capsule Take 1 capsule (100 mg total) by mouth 3 (three) times daily as needed for cough. 30 capsule 0  . carvedilol (COREG) 3.125 MG tablet Take 1 tablet (3.125 mg total) by mouth 2 (two) times daily with a meal. 60 tablet 1  . celecoxib (CELEBREX) 200 MG capsule Take 200 mg by mouth daily.    . cetirizine (ZYRTEC ALLERGY) 10 MG tablet Take 1 tablet (10 mg total) by mouth daily. (Patient taking differently: Take 10 mg by mouth daily as needed for allergies  or rhinitis. ) 30 tablet 0  . empagliflozin (JARDIANCE) 10 MG TABS tablet Take 1 tablet (10 mg total) by mouth daily. 30 tablet 0  . fluticasone (FLONASE) 50 MCG/ACT nasal spray Place 1 spray into both nostrils daily. (Patient taking differently: Place 1 spray into both nostrils daily as needed for allergies or rhinitis. ) 16 g 0  . gabapentin (NEURONTIN) 300 MG capsule Take 300 mg by mouth in the morning and at bedtime.     . Multiple Vitamins-Minerals (CENTRUM SILVER 50+MEN) TABS Take 1 tablet by mouth daily with breakfast.    . nitroGLYCERIN (NITROSTAT) 0.4 MG SL tablet Place 1 tablet (0.4 mg total) under the tongue every 5 (five) minutes as needed for chest pain. 30 tablet 12  . pantoprazole (PROTONIX) 40 MG tablet Take 40 mg by mouth daily before breakfast.     . sacubitril-valsartan (ENTRESTO) 24-26 MG Take 1 tablet by mouth 2 (two) times daily. 60 tablet 1  . spironolactone (ALDACTONE) 25 MG tablet Take 1 tablet (25 mg  total) by mouth daily. 30 tablet 1  . WIXELA INHUB 250-50 MCG/DOSE AEPB Inhale 1 puff into the lungs daily.     No current facility-administered medications for this visit.    PHYSICAL EXAMINATION: ECOG PERFORMANCE STATUS: 2 - Symptomatic, <50% confined to bed  Vitals:   01/25/20 0825  BP: 116/74  Pulse: (!) 59  Resp: 17  Temp: 97.8 F (36.6 C)  SpO2: 100%   Filed Weights   01/25/20 0825  Weight: 196 lb 1.6 oz (89 kg)    GENERAL:alert, no distress and comfortable SKIN: skin color, texture, turgor are normal, no rashes or significant lesions EYES: normal, Conjunctiva are pink and non-injected, sclera clear NECK: supple, thyroid normal size, non-tender, without nodularity LYMPH:  no palpable lymphadenopathy in the cervical, axillary  LUNGS: clear to auscultation and percussion with normal breathing effort HEART: regular rate & rhythm and no murmurs and no lower extremity edema ABDOMEN:abdomen soft, non-tender and normal bowel sounds Musculoskeletal:no  cyanosis of digits and no clubbing, no leg edema  NEURO: alert & oriented x 3 with fluent speech, no focal motor/sensory deficits  LABORATORY DATA:  I have reviewed the data as listed CBC Latest Ref Rng & Units 01/16/2020 01/15/2020 01/15/2020  WBC 4.0 - 10.5 K/uL 4.6 - -  Hemoglobin 13.0 - 17.0 g/dL 11.4(L) 12.6(L) 12.6(L)  Hematocrit 39 - 52 % 36.8(L) 37.0(L) 37.0(L)  Platelets 150 - 400 K/uL 225 - -     CMP Latest Ref Rng & Units 01/17/2020 01/16/2020 01/15/2020  Glucose 70 - 99 mg/dL 128(H) 127(H) -  BUN 8 - 23 mg/dL 12 13 -  Creatinine 0.61 - 1.24 mg/dL 1.19 1.06 -  Sodium 135 - 145 mmol/L 138 137 140  Potassium 3.5 - 5.1 mmol/L 3.6 3.5 3.5  Chloride 98 - 111 mmol/L 105 104 -  CO2 22 - 32 mmol/L 23 24 -  Calcium 8.9 - 10.3 mg/dL 9.1 8.8(L) -  Total Protein 6.5 - 8.1 g/dL - - -  Total Bilirubin 0.3 - 1.2 mg/dL - - -  Alkaline Phos 38 - 126 U/L - - -  AST 15 - 41 U/L - - -  ALT 0 - 44 U/L - - -      RADIOGRAPHIC STUDIES: I have personally reviewed the radiological images as listed and agreed with the findings in the report. No results found.   ASSESSMENT & PLAN:  Juan [name] is a 79 y.o. male with    1. 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 Servando Snare has mutually agreed not to pursue esophagectomy due to his advanced age and general health condition -His repeated EUS after treated showed good response to chemoradiation, but possible residual tumor. Follow up scans and EGD in 2020 have been NED.  -I reviewed his recent CT scan which was obtained during his recent hospitalization for congestive heart failure, showed no evidence of local or distant metastasis. -He is clinically doing  well.  It has been 4 years since his initial diagnosis, he is likely cured -f/u in 6 months with lab    2. CHF -f/u with cardiology   3. Mild anemia  -stable   4. Chronic back pain, left hip painand leg pain -He underwent left total hip arthroplasty on 07/22/17.  Pain much improved. His overall MSK pain is manageable on Gabapentin and oxycodone. I do not refill his narcotics.  -He will continue to f/u with his PCP and Orthopedic surgeon.   5. H/o COVID-19  -Tested positive in7/2020. Has recovered well. No symptoms remaining.    PLAN:  -Lab today, including lipid panel and A1c per his primary care physician's request -Lab and follow-up in 6 months      No problem-specific Assessment & Plan notes found for this encounter.   Orders Placed This Encounter  Procedures  . Lipid panel    Standing Status:   Future    Number of Occurrences:   1    Standing Expiration Date:   01/24/2021  . Hemoglobin A1c    Standing Status:   Future    Number of Occurrences:   1    Standing Expiration Date:   01/24/2021   All questions were answered. The patient knows to call the clinic with any problems, questions or concerns. No barriers to learning was detected. The total time spent in the appointment was 30 minutes.     [name], MD 01/25/2020   I, Joslyn Devon, am acting as scribe for [name], MD.   I have reviewed the above documentation for accuracy and completeness, and I agree with the above.

## 2020-01-22 NOTE — Telephone Encounter (Signed)
Mr. Juan, [name] are scheduled for a virtual visit with your provider today.    Just as we do with appointments in the office, we must obtain your consent to participate.  Your consent will be active for this visit and any virtual visit you may have with one of our providers in the next 365 days.    If you have a MyChart account, I can also send a copy of this consent to you electronically.  All virtual visits are billed to your insurance company just like a traditional visit in the office.  As this is a virtual visit, video technology does not allow for your provider to perform a traditional examination.  This may limit your provider's ability to fully assess your condition.  If your provider identifies any concerns that need to be evaluated in person or the need to arrange testing such as labs, EKG, etc, we will make arrangements to do so.    Although advances in technology are sophisticated, we cannot ensure that it will always work on either your end or our end.  If the connection with a video visit is poor, we may have to switch to a telephone visit.  With either a video or telephone visit, we are not always able to ensure that we have a secure connection.   I need to obtain your verbal consent now.   Are you willing to proceed with your visit today?   Juan [name] has provided verbal consent on 01/22/2020 for a virtual visit (video or telephone).   Monia Pouch, [place] 01/22/2020  11:32 AM

## 2020-01-22 NOTE — Progress Notes (Signed)
January 22, 2020     To Whom it May Concern;   Mr. Juan [name] was Hospitalized 10/16-10/20. He can return to work on October 27,2021 without restriction.    Thanks you   Fabian Sharp PA-C

## 2020-01-23 ENCOUNTER — Other Ambulatory Visit (HOSPITAL_COMMUNITY): Payer: 59

## 2020-01-25 ENCOUNTER — Other Ambulatory Visit: Payer: Self-pay

## 2020-01-25 ENCOUNTER — Inpatient Hospital Stay (HOSPITAL_BASED_OUTPATIENT_CLINIC_OR_DEPARTMENT_OTHER): Payer: 59 | Admitting: Hematology

## 2020-01-25 ENCOUNTER — Encounter: Payer: Self-pay | Admitting: Hematology

## 2020-01-25 ENCOUNTER — Inpatient Hospital Stay: Payer: 59

## 2020-01-25 ENCOUNTER — Telehealth: Payer: Self-pay | Admitting: Hematology

## 2020-01-25 VITALS — BP 116/74 | HR 59 | Temp 97.8°F | Resp 17 | Ht 69.0 in | Wt 196.1 lb

## 2020-01-25 DIAGNOSIS — C154 Malignant neoplasm of middle third of esophagus: Secondary | ICD-10-CM

## 2020-01-25 DIAGNOSIS — D649 Anemia, unspecified: Secondary | ICD-10-CM | POA: Diagnosis not present

## 2020-01-25 DIAGNOSIS — I2510 Atherosclerotic heart disease of native coronary artery without angina pectoris: Secondary | ICD-10-CM | POA: Diagnosis not present

## 2020-01-25 DIAGNOSIS — I509 Heart failure, unspecified: Secondary | ICD-10-CM | POA: Diagnosis not present

## 2020-01-25 DIAGNOSIS — E119 Type 2 diabetes mellitus without complications: Secondary | ICD-10-CM | POA: Diagnosis not present

## 2020-01-25 DIAGNOSIS — Z8501 Personal history of malignant neoplasm of esophagus: Secondary | ICD-10-CM | POA: Diagnosis not present

## 2020-01-25 DIAGNOSIS — M79606 Pain in leg, unspecified: Secondary | ICD-10-CM | POA: Diagnosis not present

## 2020-01-25 DIAGNOSIS — G8929 Other chronic pain: Secondary | ICD-10-CM | POA: Diagnosis not present

## 2020-01-25 LAB — COMPREHENSIVE METABOLIC PANEL
ALT: 24 U/L (ref 0–44)
AST: 23 U/L (ref 15–41)
Albumin: 4.3 g/dL (ref 3.5–5.0)
Alkaline Phosphatase: 78 U/L (ref 38–126)
Anion gap: 10 (ref 5–15)
BUN: 22 mg/dL (ref 8–23)
CO2: 25 mmol/L (ref 22–32)
Calcium: 9.2 mg/dL (ref 8.9–10.3)
Chloride: 104 mmol/L (ref 98–111)
Creatinine, Ser: 1.21 mg/dL (ref 0.61–1.24)
GFR, Estimated: >60 mL/min (ref >60)
Glucose, Bld: 122 mg/dL — ABNORMAL HIGH (ref 70–99)
Potassium: 4.4 mmol/L (ref 3.5–5.1)
Sodium: 139 mmol/L (ref 135–145)
Total Bilirubin: 0.7 mg/dL (ref 0.3–1.2)
Total Protein: 7.7 g/dL (ref 6.5–8.1)

## 2020-01-25 LAB — CBC WITH DIFFERENTIAL/PLATELET
Abs Immature Granulocytes: 0.01 10*3/uL (ref 0.00–0.07)
Basophils Absolute: 0.1 10*3/uL (ref 0.0–0.1)
Basophils Relative: 1 %
Eosinophils Absolute: 0.3 10*3/uL (ref 0.0–0.5)
Eosinophils Relative: 5 %
HCT: 43.3 % (ref 39.0–52.0)
Hemoglobin: 13.5 g/dL (ref 13.0–17.0)
Immature Granulocytes: 0 %
Lymphocytes Relative: 26 %
Lymphs Abs: 1.3 10*3/uL (ref 0.7–4.0)
MCH: 23.5 pg — ABNORMAL LOW (ref 26.0–34.0)
MCHC: 31.2 g/dL (ref 30.0–36.0)
MCV: 75.3 fL — ABNORMAL LOW (ref 80.0–100.0)
Monocytes Absolute: 0.5 10*3/uL (ref 0.1–1.0)
Monocytes Relative: 10 %
Neutro Abs: 3 10*3/uL (ref 1.7–7.7)
Neutrophils Relative %: 58 %
Platelets: 393 10*3/uL (ref 150–400)
RBC: 5.75 MIL/uL (ref 4.22–5.81)
RDW: 15.3 % (ref 11.5–15.5)
WBC: 5.1 10*3/uL (ref 4.0–10.5)
nRBC: 0 % (ref 0.0–0.2)

## 2020-01-25 LAB — LIPID PANEL
Cholesterol: 187 mg/dL (ref 0–200)
HDL: 59 mg/dL (ref >40)
LDL Cholesterol: 115 mg/dL — ABNORMAL HIGH (ref 0–99)
Total CHOL/HDL Ratio: 3.2 RATIO
Triglycerides: 63 mg/dL (ref <150)
VLDL: 13 mg/dL (ref 0–40)

## 2020-01-25 NOTE — Telephone Encounter (Signed)
Scheduled per 10/28 los. Printed avs and calendar for pt.  

## 2020-01-26 LAB — HEMOGLOBIN A1C
Hgb A1c MFr Bld: 6.5 % — ABNORMAL HIGH (ref 4.8–5.6)
Mean Plasma Glucose: 140 mg/dL

## 2020-01-30 DIAGNOSIS — I509 Heart failure, unspecified: Secondary | ICD-10-CM | POA: Diagnosis not present

## 2020-01-30 DIAGNOSIS — Z09 Encounter for follow-up examination after completed treatment for conditions other than malignant neoplasm: Secondary | ICD-10-CM | POA: Diagnosis not present

## 2020-01-31 ENCOUNTER — Other Ambulatory Visit: Payer: Self-pay | Admitting: *Deleted

## 2020-01-31 DIAGNOSIS — E1165 Type 2 diabetes mellitus with hyperglycemia: Secondary | ICD-10-CM | POA: Diagnosis not present

## 2020-01-31 DIAGNOSIS — G609 Hereditary and idiopathic neuropathy, unspecified: Secondary | ICD-10-CM | POA: Diagnosis not present

## 2020-01-31 DIAGNOSIS — E782 Mixed hyperlipidemia: Secondary | ICD-10-CM | POA: Diagnosis not present

## 2020-01-31 DIAGNOSIS — I5020 Unspecified systolic (congestive) heart failure: Secondary | ICD-10-CM | POA: Diagnosis not present

## 2020-01-31 NOTE — Patient Outreach (Signed)
[place] [facility]) Care Management  01/31/2020  Juan [name] Aug 04, 1940 [id number]   Telephone Assessment-Unsuccessful  RN attempted to reach pt at both contact numbers noted in [place]. Unable to leave a HIPAA approved voice message.   Will follow up over the next week for the 2 week follow up contact.  Raina Mina, RN Care Management Coordinator [place] Office [phone]

## 2020-02-06 ENCOUNTER — Other Ambulatory Visit: Payer: Self-pay | Admitting: *Deleted

## 2020-02-06 NOTE — Patient Outreach (Signed)
[place] [facility]) Care Management  02/06/2020  Juan [name] 1940/04/29 [id number]   Telephone Assessment-Unsuccessful  RN attempted outreach call today to numbers noted via Epic however unsuccessful. RN was not able to leave a HIPAA message. Will send outreach letter for ongoing [facility] services.   Will also follow up over the next week for ongoing [facility] services.   Raina Mina, RN Care Management Coordinator [place] Office [phone]

## 2020-02-12 ENCOUNTER — Other Ambulatory Visit: Payer: Self-pay | Admitting: *Deleted

## 2020-02-12 NOTE — Patient Outreach (Signed)
[place] [facility]) Care Management  02/12/2020  Buck Mcaffee 1940-09-15 [id number]   Telephone Assessment-Unsuccessful  RN outreach attempt unsuccessful. RN able to leave a HIPAA approved voice message requesting a call back.  Will rescheduled another outreach call for ongoing [facility] services.  Raina Mina, RN Care Management Coordinator [place] Office [phone]

## 2020-03-13 ENCOUNTER — Other Ambulatory Visit: Payer: Self-pay | Admitting: *Deleted

## 2020-03-13 NOTE — Patient Outreach (Addendum)
[place] [facility]) Care Management  03/13/2020  Juan [name] Dec 23, 1940 [id number]   TELEPHONE ASSESSMENT-CASE CLOSED  RN attempted the 4th outreach call today however unsuccessful. RN attempted calls to both the pt and his daughter Juan [name]). HIPAA messages left to both contacts requesting a call back. Will further engage if pt returns the call. No response from the outreach letter or previous calls.  RN contact [facility] based upon a referral made to this agency several months ago. Verified [facility] remains involved with a pending appointment later this month. Also verified the available contact numbers for pt and daughter. Aware to contact [facility] RN case manager will any further needs.  Based upon the unsuccessful contacts will close this case and notify the provider of pt's disposition with [facility] services.  Goals Addressed            This Visit's Progress   . THN-Make and Keep All Appointments   Not on track    Follow Up Date 11/31/2021   - call to cancel if needed - keep a calendar with prescription refill dates - keep a calendar with appointment dates    Why is this important?   Part of staying healthy is seeing the doctor for follow-up care.  If you forget your appointments, there are some things you can do to stay on track.    Notes: UNABLE TO REACH PT-CASE CLOSED    . THN-Track and Manage Fluids and Swelling   Not on track    Follow Up Date 04/30/2019   - call office if I gain more than 2 pounds in one day or 5 pounds in one week - do ankle pumps when sitting - keep legs up while sitting - track weight in diary - use salt in moderation - watch for swelling in feet, ankles and legs every day - weigh myself daily    Why is this important?   It is important to check your weight daily and watch how much salt and liquids you have.  It will help you to manage your heart failure.    Notes: UNABLE TO REACH PT-CASE CLOSED    . THN-Track and Manage Symptoms    Not on track    Follow Up Date 02/27/2020    - begin a heart failure diary - develop a rescue plan - follow rescue plan if symptoms flare-up - know when to call the doctor - track symptoms and what helps feel better or worse    Why is this important?   You will be able to handle your symptoms better if you keep track of them.  Making some simple changes to your lifestyle will help.  Eating healthy is one thing you can do to take good care of yourself.    Notes: UNABLE TO REACH PT-CASE CLOSED       Raina Mina, RN Care Management Coordinator [place] Office [phone]

## 2020-04-23 ENCOUNTER — Ambulatory Visit: Payer: 59 | Admitting: Cardiovascular Disease

## 2020-05-09 ENCOUNTER — Encounter: Payer: Self-pay | Admitting: Emergency Medicine

## 2020-05-09 ENCOUNTER — Other Ambulatory Visit: Payer: Self-pay

## 2020-05-09 ENCOUNTER — Ambulatory Visit (INDEPENDENT_AMBULATORY_CARE_PROVIDER_SITE_OTHER): Payer: 59

## 2020-05-09 ENCOUNTER — Ambulatory Visit
Admission: EM | Admit: 2020-05-09 | Discharge: 2020-05-09 | Disposition: A | Discharge status: Home / Self Care | Payer: 59 | Attending: Family Medicine | Admitting: Family Medicine

## 2020-05-09 DIAGNOSIS — I509 Heart failure, unspecified: Secondary | ICD-10-CM | POA: Diagnosis not present

## 2020-05-09 DIAGNOSIS — R0602 Shortness of breath: Secondary | ICD-10-CM

## 2020-05-09 DIAGNOSIS — J209 Acute bronchitis, unspecified: Secondary | ICD-10-CM

## 2020-05-09 DIAGNOSIS — B349 Viral infection, unspecified: Secondary | ICD-10-CM

## 2020-05-09 DIAGNOSIS — J439 Emphysema, unspecified: Secondary | ICD-10-CM | POA: Diagnosis not present

## 2020-05-09 DIAGNOSIS — Z1152 Encounter for screening for COVID-19: Secondary | ICD-10-CM

## 2020-05-09 DIAGNOSIS — E119 Type 2 diabetes mellitus without complications: Secondary | ICD-10-CM | POA: Diagnosis not present

## 2020-05-09 LAB — POCT FASTING CBG KUC MANUAL ENTRY: POCT Glucose (KUC): 138 mg/dL — AB (ref 70–99)

## 2020-05-09 MED ORDER — DOXYCYCLINE HYCLATE 100 MG PO CAPS: 100.0000 mg | ORAL_CAPSULE | Freq: Two times a day (BID) | ORAL | 0 refills | Status: DC

## 2020-05-09 MED ORDER — PREDNISONE 10 MG PO TABS: 10.0000 mg | ORAL_TABLET | Freq: Every day | ORAL | 0 refills | Status: AC

## 2020-05-09 NOTE — ED Triage Notes (Signed)
Pt said x 3 days has had diarrhea, body aches, no appetite and feeling really weak. No energy

## 2020-05-09 NOTE — Discharge Instructions (Signed)
If your shortness of breath worsens or does not improve I would like for you to follow-up with your primary care doctor to have some routine labs completed.  Your chest x-ray here today is normal.  I am treating you for acute bronchitis however I suspect you may have an infection of COVID.  Recommend wearing a mask with any close contact of others.  In the meantime also remain at home on quarantine until your COVID-19 results are known.  Our office will only contact you if your results are positive and your test should result within the next 3 to 5 days.

## 2020-05-09 NOTE — ED Provider Notes (Signed)
[facility]    CSN: [id number] Arrival date & time: 05/09/20  1331      History   Chief Complaint Chief Complaint  Patient presents with  . Generalized Body Aches  . no appetite  . Diarrhea    HPI Juan [name] is a 80 y.o. male.   HPI  Patient presents today for evaluation of diarrhea which was persistent for 2 days earlier this week however he has not had another episode today.  He endorses poor appetite and has not been able to eat over the last 2 days.  He has today developed a persistent cough.  Denies any known history of recurrent pneumonia.  He has a complex complicated medical history including cardiovascular disease and esophageal cancer as well as diabetes. He is unaware of any close contact with anyone positive for COVID-19.  He endorses some shortness of breath however reports that he has shortness of breath at his normal baseline.  His cough is nonproductive, he is afebrile at present, with normal oxygen level. Past Medical History:  Diagnosis Date  . Aortic atherosclerosis ([place])   . Back pain   . Barrett esophagus   . Bleeding nose   . DDD (degenerative disc disease), thoracolumbar    Moderate to severe  . Diabetes mellitus without complication ([place])   . Diverticulosis   . Esophageal cancer ([place]) dx'd 2017  . GERD (gastroesophageal reflux disease)   . Headache   . Hearing loss    Right ear  . History of umbilical hernia repair   . Idiopathic peripheral neuropathy   . Iron deficiency anemia   . LBBB (left bundle branch block) 02/11/2016  . Left hip pain    Severe degenerative changes  . Lipoma of neck    Right  . Mixed hyperlipidemia   . PONV (postoperative nausea and vomiting)   . Reflux   . Right thyroid nodule     Patient Active Problem List   Diagnosis Date Noted  . Unstable angina ([place])   . Acute systolic CHF (congestive heart failure) ([place]) 01/14/2020  . Emphysema lung ([place]) 01/14/2020  . Aortic atherosclerosis ([place]) 01/14/2020   . Chest pain, rule out acute myocardial infarction 01/13/2020  . DM2 (diabetes mellitus, type 2) ([place]) 01/13/2020  . Elevated blood-pressure reading without diagnosis of hypertension 01/13/2020  . Osteoarthritis of left hip 07/22/2017  . Iron deficiency anemia 05/17/2017  . LBBB (left bundle branch block) 02/11/2016  . Port catheter in place 12/25/2015  . 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 Esophageal cancer ([place]) 11/22/2015  . Noise effect on both inner ears 08/29/2015  . Right thyroid nodule 08/29/2015    Past Surgical History:  Procedure Laterality Date  . BALLOON DILATION N/A 05/06/2018   Procedure: BALLOON DILATION;  Surgeon: Carol Ada, MD;  Location: [facility];  Service: Endoscopy;  Laterality: N/A;  . COLONOSCOPY    . ESOPHAGOGASTRODUODENOSCOPY (EGD) WITH PROPOFOL N/A 05/06/2018   Procedure: ESOPHAGOGASTRODUODENOSCOPY (EGD) WITH PROPOFOL;  Surgeon: Carol Ada, MD;  Location: [facility];  Service: Endoscopy;  Laterality: N/A;  . EUS N/A 12/05/2015   Procedure: UPPER ENDOSCOPIC ULTRASOUND (EUS) LINEAR;  Surgeon: Carol Ada, MD;  Location: [facility];  Service: Endoscopy;  Laterality: N/A;  . EUS N/A 03/10/2016   Procedure: UPPER ENDOSCOPIC ULTRASOUND (EUS) LINEAR;  Surgeon: Carol Ada, MD;  Location: [facility];  Service: Endoscopy;  Laterality: N/A;  . EUS N/A 01/07/2017   Procedure: UPPER ENDOSCOPIC ULTRASOUND (EUS) LINEAR;  Surgeon: Carol Ada, MD;  Location: [facility];  Service: Endoscopy;  Laterality: N/A;  . HERNIA REPAIR    . IR GENERIC HISTORICAL  12/24/2015   IR FLUORO GUIDE PORT INSERTION RIGHT 12/24/2015 Arne Cleveland, MD [facility]  . IR GENERIC HISTORICAL  12/24/2015   IR US GUIDE VASC ACCESS RIGHT 12/24/2015 Arne Cleveland, MD [facility]  . IR REMOVAL TUN ACCESS W/ PORT W/O FL MOD SED  02/07/2018  . PORTA CATH INSERTION    . RIGHT/LEFT HEART CATH AND CORONARY ANGIOGRAPHY N/A 01/15/2020    Procedure: RIGHT/LEFT HEART CATH AND CORONARY ANGIOGRAPHY;  Surgeon: Nelva Bush, MD;  Location: [facility];  Service: Cardiovascular;  Laterality: N/A;  . SHOULDER ARTHROSCOPY W/ SUPERIOR LABRAL ANTERIOR POSTERIOR LESION REPAIR     times 2  . TOTAL HIP ARTHROPLASTY Left 07/22/2017   Procedure: LEFT TOTAL HIP ARTHROPLASTY ANTERIOR APPROACH;  Surgeon: Rod Can, MD;  Location: [facility];  Service: Orthopedics;  Laterality: Left;  . UPPER GI ENDOSCOPY  01/07/2017       Home Medications    Prior to Admission medications   Medication Sig Start Date End Date Taking? Authorizing Provider  albuterol (VENTOLIN HFA) 108 (90 Base) MCG/ACT inhaler Inhale 2 puffs into the lungs every 6 (six) hours as needed for wheezing or shortness of breath.  06/14/19   [provider]  amitriptyline (ELAVIL) 10 MG tablet Take 10 mg by mouth at bedtime. 03/11/18   [provider]  aspirin EC 81 MG tablet Take 81 mg by mouth daily.    [provider]  atorvastatin (LIPITOR) 80 MG tablet Take 1 tablet (80 mg total) by mouth daily at 6 PM. 01/17/20   Hosie Poisson, MD  benzonatate (TESSALON) 100 MG capsule Take 1 capsule (100 mg total) by mouth 3 (three) times daily as needed for cough. 12/28/19   Katy Apo, NP  carvedilol (COREG) 3.125 MG tablet Take 1 tablet (3.125 mg total) by mouth 2 (two) times daily with a meal. 01/17/20   Hosie Poisson, MD  celecoxib (CELEBREX) 200 MG capsule Take 200 mg by mouth daily. 04/17/19   [provider]  cetirizine (ZYRTEC ALLERGY) 10 MG tablet Take 1 tablet (10 mg total) by mouth daily. Patient taking differently: Take 10 mg by mouth daily as needed for allergies or rhinitis.  11/25/19   Hall-Potvin, [place], PA-C  empagliflozin (JARDIANCE) 10 MG TABS tablet Take 1 tablet (10 mg total) by mouth daily. 01/18/20   Hosie Poisson, MD  fluticasone (FLONASE) 50 MCG/ACT nasal spray Place 1 spray into both nostrils daily. Patient taking  differently: Place 1 spray into both nostrils daily as needed for allergies or rhinitis.  11/25/19   Hall-Potvin, [place], PA-C  gabapentin (NEURONTIN) 300 MG capsule Take 300 mg by mouth in the morning and at bedtime.     [provider]  Multiple Vitamins-Minerals (CENTRUM SILVER 50+MEN) TABS Take 1 tablet by mouth daily with breakfast.    [provider]  nitroGLYCERIN (NITROSTAT) 0.4 MG SL tablet Place 1 tablet (0.4 mg total) under the tongue every 5 (five) minutes as needed for chest pain. 01/17/20   Hosie Poisson, MD  pantoprazole (PROTONIX) 40 MG tablet Take 40 mg by mouth daily before breakfast.  02/21/18   [provider]  sacubitril-valsartan (ENTRESTO) 24-26 MG Take 1 tablet by mouth 2 (two) times daily. 01/17/20   Hosie Poisson, MD  spironolactone (ALDACTONE) 25 MG tablet Take 1 tablet (25 mg total) by mouth daily.  01/17/20   Hosie Poisson, MD  WIXELA INHUB 250-50 MCG/DOSE AEPB Inhale 1 puff into the lungs daily.    [provider]  furosemide (LASIX) 20 MG tablet Take 1 tablet (20 mg total) by mouth daily. Patient not taking: Reported on 07/16/2019 07/02/18 07/23/19  [name], MD    Family History Family History  Problem Relation Age of Onset  . Colon cancer Father 74  . Colon cancer Brother 50  . Heart attack Brother   . Heart disease Mother   . Heart attack Mother   . Diabetes Sister   . Stroke Sister     Social History Social History   Tobacco Use  . Smoking status: Former Smoker    Packs/day: 2.00    Years: 40.00    Pack years: 80.00    Quit date: 03/31/1999    Years since quitting: 21.1  . Smokeless tobacco: Never Used  Vaping Use  . Vaping Use: Never used  Substance Use Topics  . Alcohol use: Not Currently  . Drug use: No     Allergies   Patient has no known allergies.   Review of Systems Review of Systems Pertinent negatives listed in HPI   Physical Exam Triage Vital Signs ED Triage Vitals  Enc Vitals  Group     BP 05/09/20 1341 118/89     Pulse Rate 05/09/20 1341 95     Resp 05/09/20 1341 18     Temp 05/09/20 1341 (!) 97.5 F (36.4 C)     Temp Source 05/09/20 1341 Oral     SpO2 05/09/20 1341 96 %     Weight --      Height --      Head Circumference --      Peak Flow --      Pain Score 05/09/20 1340 5     Pain Loc --      Pain Edu? --      Excl. in [place]? --    No data found.  Updated Vital Signs BP 118/89 (BP Location: Right Arm)   Pulse 95   Temp (!) 97.5 F (36.4 C) (Oral)   Resp 18   SpO2 96%   Visual Acuity Right Eye Distance:   Left Eye Distance:   Bilateral Distance:    Right Eye Near:   Left Eye Near:    Bilateral Near:     Physical Exam Vitals reviewed.  Constitutional:      Appearance: He is ill-appearing.  HENT:     Head: Normocephalic.  Eyes:     Pupils: Pupils are equal, round, and reactive to light.  Cardiovascular:     Rate and Rhythm: Normal rate. Rhythm irregular.     Pulses: Normal pulses.  Pulmonary:     Effort: Pulmonary effort is normal.     Breath sounds: Rhonchi present.     Comments: Effort normal with rest.  Increased work of breathing with ambulation Skin:    General: Skin is dry.     Capillary Refill: Capillary refill takes less than 2 seconds.  Neurological:     General: No focal deficit present.     Mental Status: He is alert.     Gait: Gait abnormal.     Comments: Generalized lower extremity weakness at baseline, antalgic gait, use of cane for mobility assistance  Psychiatric:        Mood and Affect: Mood normal.      UC Treatments / Results  Labs (all labs  ordered are listed, but only abnormal results are displayed) Labs Reviewed  NOVEL CORONAVIRUS, NAA    EKG   Radiology No results found.  Procedures Procedures (including critical care time)  Medications Ordered in UC Medications - No data to display  Initial Impression / Assessment and Plan / UC Course  I have reviewed the triage vital signs and the  nursing notes.  Pertinent labs & imaging results that were available during my care of the patient were reviewed by me and considered in my medical decision making (see chart for details).    Patient presents today with symptoms concerning for an active COVID-19 infection.  Chest x-ray negative for any acute pneumonias.  Treating for an acute bronchitis while COVID-19 results are pending.  Patient is also diabetic however I am placing him on a low dose of prednisone over the course of the next 5 days along with doxycycline.  Patient given strict ER precautions if his symptoms worsen or do not improve.  Encouraged to try and hydrate well with water as dehydration can worsen diabetes status.  Patient verbalized understanding and agreement with plan. Final Clinical Impressions(s) / UC Diagnoses   Final diagnoses:  Encounter for screening for COVID-19  Shortness of breath  Viral illness  Acute bronchitis, unspecified organism     Discharge Instructions     If your shortness of breath worsens or does not improve I would like for you to follow-up with your primary care doctor to have some routine labs completed.  Your chest x-ray here today is normal.  I am treating you for acute bronchitis however I suspect you may have an infection of COVID.  Recommend wearing a mask with any close contact of others.  In the meantime also remain at home on quarantine until your COVID-19 results are known.  Our office will only contact you if your results are positive and your test should result within the next 3 to 5 days.    ED Prescriptions    Medication Sig Dispense Auth. Provider   doxycycline (VIBRAMYCIN) 100 MG capsule Take 1 capsule (100 mg total) by mouth 2 (two) times daily. 20 capsule Scot Jun, FNP   predniSONE (DELTASONE) 10 MG tablet Take 1 tablet (10 mg total) by mouth daily with breakfast for 5 days. 5 tablet Scot Jun, FNP     PDMP not reviewed this encounter.   Scot Jun, [place] 05/11/20 [phone]

## 2020-05-10 LAB — NOVEL CORONAVIRUS, NAA: SARS-CoV-2, NAA: NOT DETECTED

## 2020-05-10 LAB — SARS-COV-2, NAA 2 DAY TAT

## 2020-07-25 ENCOUNTER — Inpatient Hospital Stay: Payer: 59 | Admitting: Hematology

## 2020-07-25 ENCOUNTER — Inpatient Hospital Stay: Payer: 59

## 2020-07-25 DIAGNOSIS — C154 Malignant neoplasm of middle third of esophagus: Secondary | ICD-10-CM

## 2020-07-25 NOTE — Progress Notes (Incomplete)
[place]   [phone] [phone]   Clinic Follow up Note   Patient Care Team: [place], [name]), MD (Inactive) as PCP - General (Internal Medicine) Skeet Latch, MD as PCP - Cardiology (Cardiology) [name], MD as Consulting Physician (Hematology) Kyung Rudd, MD as Consulting Physician (Radiation Oncology) Juanita Craver, MD as Consulting Physician (Gastroenterology)  Date of Service:  07/25/2020  CHIEF COMPLAINT: f/u of esophageal squamous cell carcinoma  SUMMARY OF ONCOLOGIC HISTORY: Oncology History Overview Note  Presented with dysphagia and odynophagia with 20-25 pounds of weight loss in about 3-4 months  Esophageal cancer ([place])   Staging form: Esophagus - Adenocarcinoma, AJCC 7th Edition   - Clinical stage from 11/13/2015: Stage IIIB (T3, N2, M0, G2) - Signed by [name], MD on 12/11/2015    Esophageal cancer ([place])  11/13/2015 Procedure   UPPER ENDOSCOPY per Dr. Collene Mares: Large fungating, friable bleeding mass in middle third of esophagus, 22cm from incisors and extended to 27cm. Non-obstructing.   11/13/2015 Pathology Results   Invasive squamous cell carcinoma; moderately differentiated   11/13/2015 Imaging   CT ABD/PELVIS: IMPRESSION:No evidence of metastatic disease in the abdomen or pelvis. 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 Aortoiliac atherosclerosis. Small bilateral inguinal hernias containing fat the   11/22/2015 Initial Diagnosis   Esophageal cancer ([place])   12/03/2015 Imaging   PET scan showed a long segment of hypermetabolic thickening in the mid esophagus consistent with esophageal carcinoma. No clear evidence of node metastasis or distant metastasis.   12/10/2015 - 01/14/2016 Radiation Therapy   Neoadjuvant radiation to his esophageal cancer   12/10/2015 - 01/15/2016 Chemotherapy   Neoadjuvant weekly carboplatin AUC 2, and Taxol 45 mg/m, with concurrent radiation. He developed steroid-induced psychosis,  and Taxol was changed to Abraxane to avoid premedication with steroids.   02/24/2016 Imaging   CT CAP with contrast showed interval improvement mid esophageal mass, no evidence for metastatic adenopathy or distant metastasis.    03/10/2016 Procedure   Repeat EGD by Dr. Benson Norway showed wall thickening in the thoracic esophagus with the tumor was. The thickness decreased from 12.8 mm to 6-7 mm. Not able to differentiate between actual tumor versus fibrosis from radiation. Some peritumoral shoddy lymph nodes.   04/07/2016 Surgery   Patient followed up with Dr. Servando Snare, patient is not a great candidate for surgery, pt also does not want surgery, mutually agreed to not pursue esophagectomy.    06/01/2016 Imaging   CT C/A/P IMPRESSION: Mild mid esophageal wall thickening without residual mass on CT. Radiation changes in the paramediastinal lung bilaterally. No evidence of recurrent or metastatic disease.   08/18/2016 -  Hospital Admission   Patient presented to the ER with SOB and complaints of chest pain   11/07/2016 Imaging   Nm Pet Image Restag IMPRESSION: Negative PET-CT. No findings for recurrent esophageal cancer or metastatic disease.   01/07/2017 Procedure   EUS by Dr. Benson Norway 01/07/17 IMPRESSION - Normal esophagus. - Normal stomach. - Normal examined duodenum. - Wall thickening was seen in the upper third of the esophagus and in the middle third of the esophagus. - One benign lymph node was visualized in the middle paraesophageal mediastinum (level 86M). - No specimens collected.   05/10/2017 Imaging   CT CAP W Contrast 05/10/17 IMPRESSION: 1. No new or progressive findings to suggest recurrent or metastatic disease in the chest or abdomen on today's study. 2.  Aortic Atherosclerois (ICD10-170.0)   01/14/2018 Imaging   01/14/2018 CT Neck IMPRESSION: 1.  No evidence of cervical lymphadenopathy. 2. 5 cm right neck lipoma, mildly enlarged from 2010.   01/14/2018 Imaging   01/14/2018  CT CAP IMPRESSION: 1. Interval development of several tiny bilateral pulmonary nodules. While indeterminate and potentially related to infectious/inflammatory etiology, close attention recommended as early metastatic disease not excluded. 2. Otherwise stable exam. 3.  Emphysema. (ICD10-J43.9) 4.  Aortic Atherosclerois (ICD10-170.0)   01/25/2019 Imaging   CT AP W contrast  IMPRESSION: No evidence for residual or recurrent tumor within the abdomen or pelvis.     05/07/2019 Procedure   Upper Endoscopy by Dr Benson Norway  IMPRESSION - Benign-appearing esophageal stenosis. Dilated. - Normal stomach. - Normal examined duodenum. - No specimens collected.   01/13/2020 Imaging   CT CAP w Contrast  IMPRESSION: 1. Mild interlobular septal thickening and trace bilateral pleural effusions, consistent with pulmonary edema. 2. Mild centrilobular emphysema. Emphysema (ICD10-J43.9). 3. Diffuse bilateral bronchial wall thickening, consistent with nonspecific infectious or inflammatory bronchitis. 4. No evidence of esophageal mass or other specific evidence of malignancy on this examination. 5. Prominent mediastinal lymph nodes, unchanged compared to prior examination, nonspecific. 6. Coronary artery disease. Aortic Atherosclerosis (ICD10-I70.0). 7. Nonobstructive bilateral nephrolithiasis.        CURRENT THERAPY:  Observation  INTERVAL HISTORY: *** Juan [name] is here for a follow up of esophageal squamous cell carcinoma. He was last seen by me 6 months ago. He presents to the clinic {alone/accompanied by}.  REVIEW OF SYSTEMS:  *** Constitutional: Denies fevers, chills or abnormal weight loss Eyes: Denies blurriness of vision Ears, nose, mouth, throat, and face: Denies mucositis or sore throat Respiratory: Denies cough, dyspnea or wheezes Cardiovascular: Denies palpitation, chest discomfort or lower extremity swelling Gastrointestinal:  Denies nausea, heartburn or change in bowel  habits Skin: Denies abnormal skin rashes Lymphatics: Denies new lymphadenopathy or easy bruising Neurological:Denies numbness, tingling or new weaknesses Behavioral/Psych: Mood is stable, no new changes  All other systems were reviewed with the patient and are negative.  MEDICAL HISTORY:  Past Medical History:  Diagnosis Date  . Aortic atherosclerosis ([place])   . Back pain   . Barrett esophagus   . Bleeding nose   . DDD (degenerative disc disease), thoracolumbar    Moderate to severe  . Diabetes mellitus without complication ([place])   . Diverticulosis   . Esophageal cancer ([place]) dx'd 2017  . GERD (gastroesophageal reflux disease)   . Headache   . Hearing loss    Right ear  . History of umbilical hernia repair   . Idiopathic peripheral neuropathy   . Iron deficiency anemia   . LBBB (left bundle branch block) 02/11/2016  . Left hip pain    Severe degenerative changes  . Lipoma of neck    Right  . Mixed hyperlipidemia   . PONV (postoperative nausea and vomiting)   . Reflux   . Right thyroid nodule     SURGICAL HISTORY: Past Surgical History:  Procedure Laterality Date  . BALLOON DILATION N/A 05/06/2018   Procedure: BALLOON DILATION;  Surgeon: Carol Ada, MD;  Location: [facility];  Service: Endoscopy;  Laterality: N/A;  . COLONOSCOPY    . ESOPHAGOGASTRODUODENOSCOPY (EGD) WITH PROPOFOL N/A 05/06/2018   Procedure: ESOPHAGOGASTRODUODENOSCOPY (EGD) WITH PROPOFOL;  Surgeon: Carol Ada, MD;  Location: [facility];  Service: Endoscopy;  Laterality: N/A;  . EUS N/A 12/05/2015   Procedure: UPPER ENDOSCOPIC ULTRASOUND (EUS) LINEAR;  Surgeon: Carol Ada, MD;  Location: [facility];  Service: Endoscopy;  Laterality: N/A;  . EUS N/A 03/10/2016  Procedure: UPPER ENDOSCOPIC ULTRASOUND (EUS) LINEAR;  Surgeon: Carol Ada, MD;  Location: [facility];  Service: Endoscopy;  Laterality: N/A;  . EUS N/A 01/07/2017   Procedure: UPPER ENDOSCOPIC ULTRASOUND (EUS) LINEAR;  Surgeon: Carol Ada, MD;  Location: [facility];  Service: Endoscopy;  Laterality: N/A;  . HERNIA REPAIR    . IR GENERIC HISTORICAL  12/24/2015   IR FLUORO GUIDE PORT INSERTION RIGHT 12/24/2015 Arne Cleveland, MD [facility]  . IR GENERIC HISTORICAL  12/24/2015   IR US GUIDE VASC ACCESS RIGHT 12/24/2015 Arne Cleveland, MD [facility]  . IR REMOVAL TUN ACCESS W/ PORT W/O FL MOD SED  02/07/2018  . PORTA CATH INSERTION    . RIGHT/LEFT HEART CATH AND CORONARY ANGIOGRAPHY N/A 01/15/2020   Procedure: RIGHT/LEFT HEART CATH AND CORONARY ANGIOGRAPHY;  Surgeon: Nelva Bush, MD;  Location: [facility];  Service: Cardiovascular;  Laterality: N/A;  . SHOULDER ARTHROSCOPY W/ SUPERIOR LABRAL ANTERIOR POSTERIOR LESION REPAIR     times 2  . TOTAL HIP ARTHROPLASTY Left 07/22/2017   Procedure: LEFT TOTAL HIP ARTHROPLASTY ANTERIOR APPROACH;  Surgeon: Rod Can, MD;  Location: [facility];  Service: Orthopedics;  Laterality: Left;  . UPPER GI ENDOSCOPY  01/07/2017    I have reviewed the social history and family history with the patient and they are unchanged from previous note.  ALLERGIES:  has No Known Allergies.  MEDICATIONS:  Current Outpatient Medications  Medication Sig Dispense Refill  . albuterol (VENTOLIN HFA) 108 (90 Base) MCG/ACT inhaler Inhale 2 puffs into the lungs every 6 (six) hours as needed for wheezing or shortness of breath.     Marland Kitchen amitriptyline (ELAVIL) 10 MG tablet Take 10 mg by mouth at bedtime.    Marland Kitchen aspirin EC 81 MG tablet Take 81 mg by mouth daily.    Marland Kitchen atorvastatin (LIPITOR) 80 MG tablet Take 1 tablet (80 mg total) by mouth daily at 6 PM. 30 tablet 1  . benzonatate (TESSALON) 100 MG capsule Take 1 capsule (100 mg total) by mouth 3 (three) times daily as needed for cough. 30 capsule 0  . carvedilol (COREG) 3.125 MG tablet Take 1 tablet (3.125 mg total) by mouth 2 (two) times daily with a meal. 60 tablet 1  . celecoxib (CELEBREX) 200 MG capsule Take 200 mg by mouth daily.    .  cetirizine (ZYRTEC ALLERGY) 10 MG tablet Take 1 tablet (10 mg total) by mouth daily. (Patient taking differently: Take 10 mg by mouth daily as needed for allergies or rhinitis. ) 30 tablet 0  . doxycycline (VIBRAMYCIN) 100 MG capsule Take 1 capsule (100 mg total) by mouth 2 (two) times daily. 20 capsule 0  . empagliflozin (JARDIANCE) 10 MG TABS tablet Take 1 tablet (10 mg total) by mouth daily. 30 tablet 0  . fluticasone (FLONASE) 50 MCG/ACT nasal spray Place 1 spray into both nostrils daily. (Patient taking differently: Place 1 spray into both nostrils daily as needed for allergies or rhinitis. ) 16 g 0  . gabapentin (NEURONTIN) 300 MG capsule Take 300 mg by mouth in the morning and at bedtime.     . Multiple Vitamins-Minerals (CENTRUM SILVER 50+MEN) TABS Take 1 tablet by mouth daily with breakfast.    . nitroGLYCERIN (NITROSTAT) 0.4 MG SL tablet PLACE 1 TABLET (0.4 MG TOTAL) UNDER THE TONGUE EVERY FIVE MINUTES AS NEEDED FOR CHEST PAIN. 25 tablet 12  . pantoprazole (PROTONIX) 40 MG tablet Take 40 mg by mouth daily before breakfast.     .  sacubitril-valsartan (ENTRESTO) 24-26 MG Take 1 tablet by mouth 2 (two) times daily. 60 tablet 1  . spironolactone (ALDACTONE) 25 MG tablet Take 1 tablet (25 mg total) by mouth daily. 30 tablet 1  . WIXELA INHUB 250-50 MCG/DOSE AEPB Inhale 1 puff into the lungs daily.     No current facility-administered medications for this visit.    PHYSICAL EXAMINATION: ECOG PERFORMANCE STATUS: {CHL ONC ECOG PS:[phone]}  There were no vitals filed for this visit. There were no vitals filed for this visit. *** GENERAL:alert, no distress and comfortable SKIN: skin color, texture, turgor are normal, no rashes or significant lesions EYES: normal, Conjunctiva are pink and non-injected, sclera clear {OROPHARYNX:no exudate, no erythema and lips, buccal mucosa, and tongue normal}  NECK: supple, thyroid normal size, non-tender, without nodularity LYMPH:  no palpable  lymphadenopathy in the cervical, axillary {or inguinal} LUNGS: clear to auscultation and percussion with normal breathing effort HEART: regular rate & rhythm and no murmurs and no lower extremity edema ABDOMEN:abdomen soft, non-tender and normal bowel sounds Musculoskeletal:no cyanosis of digits and no clubbing  NEURO: alert & oriented x 3 with fluent speech, no focal motor/sensory deficits  LABORATORY DATA:  I have reviewed the data as listed CBC Latest Ref Rng & Units 01/25/2020 01/16/2020 01/15/2020  WBC 4.0 - 10.5 K/uL 5.1 4.6 -  Hemoglobin 13.0 - 17.0 g/dL 13.5 11.4(L) 12.6(L)  Hematocrit 39.0 - 52.0 % 43.3 36.8(L) 37.0(L)  Platelets 150 - 400 K/uL 393 225 -     CMP Latest Ref Rng & Units 01/25/2020 01/17/2020 01/16/2020  Glucose 70 - 99 mg/dL 122(H) 128(H) 127(H)  BUN 8 - 23 mg/dL 22 12 13   Creatinine 0.61 - 1.24 mg/dL 1.21 1.19 1.06  Sodium 135 - 145 mmol/L 139 138 137  Potassium 3.5 - 5.1 mmol/L 4.4 3.6 3.5  Chloride 98 - 111 mmol/L 104 105 104  CO2 22 - 32 mmol/L 25 23 24   Calcium 8.9 - 10.3 mg/dL 9.2 9.1 8.8(L)  Total Protein 6.5 - 8.1 g/dL 7.7 - -  Total Bilirubin 0.3 - 1.2 mg/dL 0.7 - -  Alkaline Phos 38 - 126 U/L 78 - -  AST 15 - 41 U/L 23 - -  ALT 0 - 44 U/L 24 - -      RADIOGRAPHIC STUDIES: I have personally reviewed the radiological images as listed and agreed with the findings in the report. No results found.   ASSESSMENT & PLAN: *** Juan [name] is a 80 y.o. male with   1. 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 Servando Snare has mutually agreed not to pursue esophagectomy due to his advanced age and general health condition -His repeated EUS after treated showed good  response to chemoradiation, but possible residual tumor. Follow up scans and EGD in 2020 have been NED.  -CT scan from 01/13/20 showed no evidence of local or distant metastasis. -He is clinically doing well.  It has been 4 years since his initial diagnosis, he is likely cured -f/u in 6 months with lab   2. CHF -f/u with cardiology   3. Mild anemia  -stable   4. Chronic back pain, left hip painand leg pain -He underwent left total hip arthroplasty on 07/22/17. Pain much improved. His overall MSK  pain is manageable on Gabapentin and oxycodone.I do not refill his narcotics. -He will continue to f/u with his PCP and Orthopedic surgeon.   5.H/oCOVID-19  -Tested positive in7/2020. Has recovered well. No symptoms remaining.    PLAN: ***  -Lab today, including lipid panel and A1c per his primary care physician's request -Lab and follow-up in 6 months    ***  No problem-specific Assessment & Plan notes found for this encounter.   No orders of the defined types were placed in this encounter.  All questions were answered. The patient knows to call the clinic with any problems, questions or concerns. No barriers to learning was detected. The total time spent in the appointment was {CHL ONC TIME VISIT - [id number].     Aurea Graff 07/25/2020   I, Wilburn Mylar, am acting as scribe for [name], MD.   {Add scribe attestation statement}

## 2020-07-30 ENCOUNTER — Encounter: Payer: Self-pay | Admitting: Cardiovascular Disease

## 2020-07-30 ENCOUNTER — Ambulatory Visit (INDEPENDENT_AMBULATORY_CARE_PROVIDER_SITE_OTHER): Payer: 59 | Admitting: Cardiovascular Disease

## 2020-07-30 ENCOUNTER — Other Ambulatory Visit: Payer: Self-pay

## 2020-07-30 VITALS — BP 118/70 | HR 65 | Ht 69.0 in | Wt 203.0 lb

## 2020-07-30 DIAGNOSIS — E785 Hyperlipidemia, unspecified: Secondary | ICD-10-CM | POA: Diagnosis not present

## 2020-07-30 DIAGNOSIS — I10 Essential (primary) hypertension: Secondary | ICD-10-CM

## 2020-07-30 DIAGNOSIS — I700 Atherosclerosis of aorta: Secondary | ICD-10-CM

## 2020-07-30 DIAGNOSIS — Z5181 Encounter for therapeutic drug level monitoring: Secondary | ICD-10-CM | POA: Diagnosis not present

## 2020-07-30 DIAGNOSIS — I2510 Atherosclerotic heart disease of native coronary artery without angina pectoris: Secondary | ICD-10-CM | POA: Diagnosis not present

## 2020-07-30 DIAGNOSIS — M5431 Sciatica, right side: Secondary | ICD-10-CM

## 2020-07-30 DIAGNOSIS — I5042 Chronic combined systolic (congestive) and diastolic (congestive) heart failure: Secondary | ICD-10-CM

## 2020-07-30 DIAGNOSIS — M5432 Sciatica, left side: Secondary | ICD-10-CM

## 2020-07-30 DIAGNOSIS — M5430 Sciatica, unspecified side: Secondary | ICD-10-CM | POA: Insufficient documentation

## 2020-07-30 HISTORY — DX: Atherosclerotic heart disease of native coronary artery without angina pectoris: I25.10

## 2020-07-30 HISTORY — DX: Essential (primary) hypertension: I10

## 2020-07-30 HISTORY — DX: Sciatica, unspecified side: M54.30

## 2020-07-30 NOTE — Assessment & Plan Note (Signed)
Mild to moderate CAD on cath.  He has chest pain that is not ischemic.  Continue medical management with aspirin and atorvastatin as well as carvedilol.  LDL was not at goal when checked in January.  We will repeat in 3 months and if his lipids remain above goal we will need to add another agent.

## 2020-07-30 NOTE — Progress Notes (Signed)
Cardiology Office Note   Date:  07/30/2020   ID:  Juan [name], DOB 09-30-1940, MRN [id number]  PCP:  Margorie John Georgianne Fick), MD (Inactive)  Cardiologist:   Chilton Si, MD   Chief Complaint  Patient presents with  . Follow-up      History of Present Illness: Juan [name] is a 80 y.o. male with LBBB, esophageal cancer s/p chemo/XRT, hyperlipidemia, and prior tobacco abuse who presents for follow up. He was first seen 2017 for preoperative risk assessment prior to surgery for esophageal cancer.  Mr. 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  He presented with a 20-25.lb weight loss over 3-4 months.  He under went chemotherapy (carboplatin, Taxol-->Abraxane) with XRT. He quit smoking in 2001 after a 75 pack year history.  He also quit drinking alcohol in 2000 after drinking heavily in the past.   He was hospitalized10/2021 with chest pain and shortness of breath. Echo revealed LVEF 25%. He had a left heart cath that revealed mild to moderate non-obstructive CAD. He was started on entresto, carvedilol, spirolactone, and jardiance. He last saw Angie Duke, PE-C on 12/2019 and was doing well.  Today, he reports feeling good. Within the last two weeks he has noted a very quick, dull pain in his chest, severity 5/10.  This occurs about every other day.  Diaphoresis correlates with his chest pain, but no nausea or shortness of breath. Usually these episodes occur after coming home from work and lying down. He also experiences leg and back pain, which does not correlate with the chest pain. Sometimes he has orthopnea, but denies any LE edema or PND. He usually eats home-cooked meals without salt. In the morning he has a cup of coffee, and limits his fluid intake to <2L daily. His weight remains stable, averaging 192-196, and reports his at home blood pressure is stable. He is compliant with gabapentin and it relieves some back pain. As of  today, he has not needed to take nitroglycerin.  Past Medical History:  Diagnosis Date  . Aortic atherosclerosis (HCC)   . Back pain   . Barrett esophagus   . Bleeding nose   . CAD in native artery 07/30/2020  . DDD (degenerative disc disease), thoracolumbar    Moderate to severe  . Diabetes mellitus without complication (HCC)   . Diverticulosis   . Esophageal cancer (HCC) dx'd 2017  . Essential hypertension 07/30/2020  . GERD (gastroesophageal reflux disease)   . Headache   . Hearing loss    Right ear  . History of umbilical hernia repair   . Idiopathic peripheral neuropathy   . Iron deficiency anemia   . LBBB (left bundle branch block) 02/11/2016  . Left hip pain    Severe degenerative changes  . Lipoma of neck    Right  . Mixed hyperlipidemia   . PONV (postoperative nausea and vomiting)   . Reflux   . Right thyroid nodule   . Sciatica 07/30/2020    Past Surgical History:  Procedure Laterality Date  . BALLOON DILATION N/A 05/06/2018   Procedure: BALLOON DILATION;  Surgeon: Jeani Hawking, MD;  Location: [facility];  Service: Endoscopy;  Laterality: N/A;  . COLONOSCOPY    . ESOPHAGOGASTRODUODENOSCOPY (EGD) WITH PROPOFOL N/A 05/06/2018   Procedure: ESOPHAGOGASTRODUODENOSCOPY (EGD) WITH PROPOFOL;  Surgeon: Jeani Hawking, MD;  Location: [facility];  Service: Endoscopy;  Laterality: N/A;  . EUS N/A 12/05/2015   Procedure: UPPER ENDOSCOPIC ULTRASOUND (EUS) LINEAR;  Surgeon: Jeani Hawking, MD;  Location: [facility];  Service: Endoscopy;  Laterality: N/A;  . EUS N/A 03/10/2016   Procedure: UPPER ENDOSCOPIC ULTRASOUND (EUS) LINEAR;  Surgeon: Jeani Hawking, MD;  Location: [facility];  Service: Endoscopy;  Laterality: N/A;  . EUS N/A 01/07/2017   Procedure: UPPER ENDOSCOPIC ULTRASOUND (EUS) LINEAR;  Surgeon: Jeani Hawking, MD;  Location: [facility] ENDOSCOPY;  Service: Endoscopy;  Laterality: N/A;  . HERNIA REPAIR    . IR GENERIC HISTORICAL  12/24/2015   IR FLUORO GUIDE PORT INSERTION RIGHT  12/24/2015 Oley Balm, MD [facility]  . IR GENERIC HISTORICAL  12/24/2015   IR US GUIDE VASC ACCESS RIGHT 12/24/2015 Oley Balm, MD [facility]  . IR REMOVAL TUN ACCESS W/ PORT W/O FL MOD SED  02/07/2018  . PORTA CATH INSERTION    . RIGHT/LEFT HEART CATH AND CORONARY ANGIOGRAPHY N/A 01/15/2020   Procedure: RIGHT/LEFT HEART CATH AND CORONARY ANGIOGRAPHY;  Surgeon: Yvonne Kendall, MD;  Location: [facility];  Service: Cardiovascular;  Laterality: N/A;  . SHOULDER ARTHROSCOPY W/ SUPERIOR LABRAL ANTERIOR POSTERIOR LESION REPAIR     times 2  . TOTAL HIP ARTHROPLASTY Left 07/22/2017   Procedure: LEFT TOTAL HIP ARTHROPLASTY ANTERIOR APPROACH;  Surgeon: Samson Frederic, MD;  Location: [facility]S;  Service: Orthopedics;  Laterality: Left;  . UPPER GI ENDOSCOPY  01/07/2017     Current Outpatient Medications  Medication Sig Dispense Refill  . albuterol (VENTOLIN HFA) 108 (90 Base) MCG/ACT inhaler Inhale 2 puffs into the lungs every 6 (six) hours as needed for wheezing or shortness of breath.     Marland Kitchen amitriptyline (ELAVIL) 10 MG tablet Take 10 mg by mouth at bedtime.    Marland Kitchen aspirin EC 81 MG tablet Take 81 mg by mouth daily.    Marland Kitchen atorvastatin (LIPITOR) 80 MG tablet Take 1 tablet (80 mg total) by mouth daily at 6 PM. 30 tablet 1  . benzonatate (TESSALON) 100 MG capsule Take 1 capsule (100 mg total) by mouth 3 (three) times daily as needed for cough. 30 capsule 0  . carvedilol (COREG) 3.125 MG tablet Take 1 tablet (3.125 mg total) by mouth 2 (two) times daily with a meal. 60 tablet 1  . celecoxib (CELEBREX) 200 MG capsule Take 200 mg by mouth daily.    . cetirizine (ZYRTEC ALLERGY) 10 MG tablet Take 1 tablet (10 mg total) by mouth daily. (Patient taking differently: Take 10 mg by mouth daily as needed for allergies or rhinitis.) 30 tablet 0  . doxycycline (VIBRAMYCIN) 100 MG capsule Take 1 capsule (100 mg total) by mouth 2 (two) times daily. 20 capsule 0  . empagliflozin (JARDIANCE) 10 MG TABS  tablet Take 1 tablet (10 mg total) by mouth daily. 30 tablet 0  . fluticasone (FLONASE) 50 MCG/ACT nasal spray Place 1 spray into both nostrils daily. (Patient taking differently: Place 1 spray into both nostrils daily as needed for allergies or rhinitis.) 16 g 0  . gabapentin (NEURONTIN) 300 MG capsule Take 300 mg by mouth in the morning and at bedtime.     . Multiple Vitamins-Minerals (CENTRUM SILVER 50+MEN) TABS Take 1 tablet by mouth daily with breakfast.    . nitroGLYCERIN (NITROSTAT) 0.4 MG SL tablet PLACE 1 TABLET (0.4 MG TOTAL) UNDER THE TONGUE EVERY FIVE MINUTES AS NEEDED FOR CHEST PAIN. 25 tablet 12  . pantoprazole (PROTONIX) 40 MG tablet Take 40 mg by mouth daily before breakfast.     . sacubitril-valsartan (ENTRESTO) 24-26 MG Take 1 tablet by  mouth 2 (two) times daily. 60 tablet 1  . spironolactone (ALDACTONE) 25 MG tablet Take 1 tablet (25 mg total) by mouth daily. 30 tablet 1  . WIXELA INHUB 250-50 MCG/DOSE AEPB Inhale 1 puff into the lungs daily.     No current facility-administered medications for this visit.    Allergies:   Patient has no known allergies.    Social History:  The patient  reports that he quit smoking about 21 years ago. He has a 80.00 pack-year smoking history. He has never used smokeless tobacco. He reports previous alcohol use. He reports that he does not use drugs.   Family History:  The patient's family history includes Colon cancer (age of onset: 36) in his brother; Colon cancer (age of onset: 51) in his father; Diabetes in his sister; Heart attack in his brother and mother; Heart disease in his mother; Stroke in his sister.    ROS:   Please see the history of present illness. (+) Chest pain (+) Diaphoresis (+) Leg pain (+) Back pain (+) Orthopnea All other systems are reviewed and negative.    PHYSICAL EXAM: VS:  BP 118/70   Pulse 65   Ht 5\' 9"  (1.753 m)   Wt 203 lb (92.1 kg)   BMI 29.98 kg/m  , BMI Body mass index is 29.98 kg/m. GENERAL:   Well appearing HEENT:  Pupils equal round and reactive, fundi not visualized, oral mucosa unremarkable NECK:  No jugular venous distention, waveform within normal limits, carotid upstroke brisk and symmetric, no bruits, no thyromegaly LYMPHATICS:  No cervical adenopathy LUNGS:  Clear to auscultation bilaterally HEART:  RRR.  PMI not displaced or sustained,S1 and S2 within normal limits, no S3, no S4, no clicks, no rubs, no murmurs ABD:  Flat, positive bowel sounds normal in frequency in pitch, no bruits, no rebound, no guarding, no midline pulsatile mass, no hepatomegaly, no splenomegaly EXT:  2 plus pulses throughout, no edema, no cyanosis no clubbing SKIN:  No rashes no nodules NEURO:  Cranial nerves II through XII grossly intact, motor grossly intact throughout PSYCH:  Cognitively intact, oriented to person place and time   EKG:   12/20/15: sinus rhythm rate 60 bpm.  LBBB. 07/30/2020: EKG is not ordered today.   Recent Labs: 01/25/2020: ALT 24; BUN 22; Creatinine, Ser 1.21; Hemoglobin 13.5; Platelets 393; Potassium 4.4; Sodium 139    Lipid Panel    Component Value Date/Time   CHOL 187 01/25/2020 0845   TRIG 63 01/25/2020 0845   HDL 59 01/25/2020 0845   CHOLHDL 3.2 01/25/2020 0845   VLDL 13 01/25/2020 0845   LDLCALC 115 (H) 01/25/2020 0845      Wt Readings from Last 3 Encounters:  07/30/20 203 lb (92.1 kg)  01/25/20 196 lb 1.6 oz (89 kg)  01/22/20 200 lb (90.7 kg)      ASSESSMENT AND PLAN: Chronic combined systolic and diastolic heart failure [facility], [place]) Mr. Chiquita Loth is doing well clinically and is euvolemic on exam.  Continue carvedilol, Entresto, Jardiance, and spironolactone.  We will continue tracking his weight daily.  He has been on guideline directed medical therapy for more than 3 months.  We will repeat his echo and if his LVEF remains less than 35% then he will need to be considered for ICD.  Essential hypertension He brings a log of his blood pressure showing that it  has been elevated at home.  We discussed proper technique.  In the office today it is quite well-controlled.  Continue  carvedilol, Entresto, and spironolactone.  LBBB (left bundle branch block) Chronic.  CAD in native artery Mild to moderate CAD on cath.  He has chest pain that is not ischemic.  Continue medical management with aspirin and atorvastatin as well as carvedilol.  LDL was not at goal when checked in January.  We will repeat in 3 months and if his lipids remain above goal we will need to add another agent.  Sciatica He has bilateral leg pain that is radicular in nature.  He has good peripheral pulses.  Recommend that he continue his gabapentin and talk to his primary care provider about this.    Current medicines are reviewed at length with the patient today.  The patient does not have concerns regarding medicines.  The following changes have been made:  no change  Labs/ tests ordered today include:   Orders Placed This Encounter  Procedures  . Lipid panel  . Comprehensive metabolic panel  . ECHOCARDIOGRAM COMPLETE     Disposition:   FU with Nakeisha Greenhouse C. Duke Salvia, MD, [facility] in 3 months.    I,Mathew Stumpf,acting as a Neurosurgeon for Chilton Si, MD.,have documented all relevant documentation on the behalf of Chilton Si, MD,as directed by  Chilton Si, MD while in the presence of Chilton Si, MD.  I, Naseer Hearn C. Duke Salvia, MD have reviewed all documentation for this visit.  The documentation of the exam, diagnosis, procedures, and orders on 07/30/2020 are all accurate and complete.   Signed, Copeland Lapier C. Duke Salvia, MD, [facility]  07/30/2020 1:05 PM    [facility]

## 2020-07-30 NOTE — Assessment & Plan Note (Addendum)
Mr. Juan [name] is doing well clinically and is euvolemic on exam.  Continue carvedilol, Entresto, Jardiance, and spironolactone.  We will continue tracking his weight daily.  He has been on guideline directed medical therapy for more than 3 months.  We will repeat his echo and if his LVEF remains less than 35% then he will need to be considered for ICD.

## 2020-07-30 NOTE — Assessment & Plan Note (Signed)
He brings a log of his blood pressure showing that it has been elevated at home.  We discussed proper technique.  In the office today it is quite well-controlled.  Continue carvedilol, Entresto, and spironolactone.

## 2020-07-30 NOTE — Assessment & Plan Note (Signed)
He has bilateral leg pain that is radicular in nature.  He has good peripheral pulses.  Recommend that he continue his gabapentin and talk to his primary care provider about this.

## 2020-07-30 NOTE — Assessment & Plan Note (Signed)
Chronic. 

## 2020-07-30 NOTE — Patient Instructions (Signed)
Medication Instructions:  Your physician recommends that you continue on your current medications as directed. Please refer to the Current Medication list given to you today.   *If you need a refill on your cardiac medications before your next appointment, please call your pharmacy*  Lab Work: FASTING LP/CMET PRIOR TO YOUR FOLLOW UP IN 3 MONTHS  If you have labs (blood work) drawn today and your tests are completely normal, you will receive your results only by: Marland Kitchen MyChart Message (if you have MyChart) OR . A paper copy in the mail If you have any lab test that is abnormal or we need to change your treatment, we will call you to review the results.  Testing/Procedures: NONE   Follow-Up: At [facility], you and your health needs are our priority.  As part of our continuing mission to provide you with exceptional heart care, we have created designated Provider Care Teams.  These Care Teams include your primary Cardiologist (physician) and Advanced Practice Providers (APPs -  Physician Assistants and Nurse Practitioners) who all work together to provide you with the care you need, when you need it.  We recommend signing up for the patient portal called "MyChart".  Sign up information is provided on this After Visit Summary.  MyChart is used to connect with patients for Virtual Visits (Telemedicine).  Patients are able to view lab/test results, encounter notes, upcoming appointments, etc.  Non-urgent messages can be sent to your provider as well.   To learn more about what you can do with MyChart, go to [email or web address].    Your next appointment:   3 month(s)  The format for your next appointment:   In Person  Provider:   DR [place]

## 2020-08-29 ENCOUNTER — Other Ambulatory Visit (HOSPITAL_COMMUNITY): Payer: 59

## 2020-08-30 ENCOUNTER — Encounter (HOSPITAL_COMMUNITY): Payer: Self-pay | Admitting: Cardiovascular Disease

## 2020-09-05 ENCOUNTER — Telehealth: Payer: Self-pay | Admitting: Cardiovascular Disease

## 2020-09-05 ENCOUNTER — Other Ambulatory Visit: Payer: Self-pay

## 2020-09-05 MED ORDER — CARVEDILOL 3.125 MG PO TABS
3.1250 mg | ORAL_TABLET | Freq: Two times a day (BID) | ORAL | 3 refills | Status: DC
Start: 2020-09-05 — End: 2021-07-10

## 2020-09-05 MED ORDER — ATORVASTATIN CALCIUM 80 MG PO TABS
80.0000 mg | ORAL_TABLET | Freq: Every day | ORAL | 3 refills | Status: DC
Start: 2020-09-05 — End: 2021-05-14

## 2020-09-05 MED ORDER — SPIRONOLACTONE 25 MG PO TABS: 25.0000 mg | ORAL_TABLET | Freq: Every day | ORAL | 3 refills | Status: AC

## 2020-09-05 NOTE — Telephone Encounter (Signed)
*  STAT* If patient is at the pharmacy, call can be transferred to refill team.   1. Which medications need to be refilled? (please list name of each medication and dose if known) carvedilol (COREG) 3.125 MG tablet  pantoprazole (PROTONIX) 40 MG tablet spironolactone (ALDACTONE) 25 MG tablet atorvastatin (LIPITOR) 80 MG tablet empagliflozin (JARDIANCE) 10 MG TABS tablet  2. Which pharmacy/location (including street and city if local pharmacy) is medication to be sent to? [id number] - [place], [place] - [address] AT [place]  3. Do they need a 30 day or 90 day supply? 90 day supply

## 2020-09-13 ENCOUNTER — Telehealth (HOSPITAL_COMMUNITY): Payer: Self-pay | Admitting: Cardiovascular Disease

## 2020-09-13 NOTE — Telephone Encounter (Signed)
Just an FYI. We have made several attempts to contact this patient including sending a letter to schedule or reschedule their echocardiogram. We will be removing the patient from the echo WQ.  08/29/20 NO SHOWED-MAILED LETTER [facility]     Thank you

## 2020-09-19 ENCOUNTER — Emergency Department (HOSPITAL_COMMUNITY)
Admission: EM | Admit: 2020-09-19 | Discharge: 2020-09-19 | Disposition: A | Discharge status: Home / Self Care | Payer: 59 | Attending: Emergency Medicine | Admitting: Emergency Medicine

## 2020-09-19 ENCOUNTER — Other Ambulatory Visit: Payer: Self-pay

## 2020-09-19 ENCOUNTER — Encounter (HOSPITAL_COMMUNITY): Payer: Self-pay | Admitting: Pharmacy Technician

## 2020-09-19 ENCOUNTER — Emergency Department (HOSPITAL_COMMUNITY): Payer: 59

## 2020-09-19 DIAGNOSIS — K219 Gastro-esophageal reflux disease without esophagitis: Secondary | ICD-10-CM | POA: Insufficient documentation

## 2020-09-19 DIAGNOSIS — Z79899 Other long term (current) drug therapy: Secondary | ICD-10-CM | POA: Diagnosis not present

## 2020-09-19 DIAGNOSIS — I110 Hypertensive heart disease with heart failure: Secondary | ICD-10-CM | POA: Diagnosis not present

## 2020-09-19 DIAGNOSIS — Z96642 Presence of left artificial hip joint: Secondary | ICD-10-CM | POA: Diagnosis not present

## 2020-09-19 DIAGNOSIS — I5042 Chronic combined systolic (congestive) and diastolic (congestive) heart failure: Secondary | ICD-10-CM | POA: Insufficient documentation

## 2020-09-19 DIAGNOSIS — R001 Bradycardia, unspecified: Secondary | ICD-10-CM | POA: Insufficient documentation

## 2020-09-19 DIAGNOSIS — R1013 Epigastric pain: Secondary | ICD-10-CM | POA: Diagnosis present

## 2020-09-19 DIAGNOSIS — Z7982 Long term (current) use of aspirin: Secondary | ICD-10-CM | POA: Insufficient documentation

## 2020-09-19 DIAGNOSIS — Z8501 Personal history of malignant neoplasm of esophagus: Secondary | ICD-10-CM | POA: Diagnosis not present

## 2020-09-19 DIAGNOSIS — Z87891 Personal history of nicotine dependence: Secondary | ICD-10-CM | POA: Diagnosis not present

## 2020-09-19 DIAGNOSIS — I2510 Atherosclerotic heart disease of native coronary artery without angina pectoris: Secondary | ICD-10-CM | POA: Diagnosis not present

## 2020-09-19 DIAGNOSIS — E119 Type 2 diabetes mellitus without complications: Secondary | ICD-10-CM | POA: Diagnosis not present

## 2020-09-19 DIAGNOSIS — J439 Emphysema, unspecified: Secondary | ICD-10-CM | POA: Diagnosis not present

## 2020-09-19 DIAGNOSIS — R0789 Other chest pain: Secondary | ICD-10-CM

## 2020-09-19 LAB — COMPREHENSIVE METABOLIC PANEL
ALT: 24 U/L (ref 0–44)
AST: 30 U/L (ref 15–41)
Albumin: 3.8 g/dL (ref 3.5–5.0)
Alkaline Phosphatase: 68 U/L (ref 38–126)
Anion gap: 9 (ref 5–15)
BUN: 9 mg/dL (ref 8–23)
CO2: 24 mmol/L (ref 22–32)
Calcium: 9.7 mg/dL (ref 8.9–10.3)
Chloride: 105 mmol/L (ref 98–111)
Creatinine, Ser: 1.02 mg/dL (ref 0.61–1.24)
GFR, Estimated: >60 mL/min (ref >60)
Glucose, Bld: 110 mg/dL — ABNORMAL HIGH (ref 70–99)
Potassium: 3.8 mmol/L (ref 3.5–5.1)
Sodium: 138 mmol/L (ref 135–145)
Total Bilirubin: 0.5 mg/dL (ref 0.3–1.2)
Total Protein: 6.8 g/dL (ref 6.5–8.1)

## 2020-09-19 LAB — CBC WITH DIFFERENTIAL/PLATELET
Abs Immature Granulocytes: 0.01 10*3/uL (ref 0.00–0.07)
Basophils Absolute: 0.1 10*3/uL (ref 0.0–0.1)
Basophils Relative: 1 %
Eosinophils Absolute: 0.3 10*3/uL (ref 0.0–0.5)
Eosinophils Relative: 5 %
HCT: 41.2 % (ref 39.0–52.0)
Hemoglobin: 12.7 g/dL — ABNORMAL LOW (ref 13.0–17.0)
Immature Granulocytes: 0 %
Lymphocytes Relative: 20 %
Lymphs Abs: 1.2 10*3/uL (ref 0.7–4.0)
MCH: 24.2 pg — ABNORMAL LOW (ref 26.0–34.0)
MCHC: 30.8 g/dL (ref 30.0–36.0)
MCV: 78.6 fL — ABNORMAL LOW (ref 80.0–100.0)
Monocytes Absolute: 0.4 10*3/uL (ref 0.1–1.0)
Monocytes Relative: 7 %
Neutro Abs: 4.2 10*3/uL (ref 1.7–7.7)
Neutrophils Relative %: 67 %
Platelets: 279 10*3/uL (ref 150–400)
RBC: 5.24 MIL/uL (ref 4.22–5.81)
RDW: 15 % (ref 11.5–15.5)
WBC: 6.2 10*3/uL (ref 4.0–10.5)
nRBC: 0 % (ref 0.0–0.2)

## 2020-09-19 LAB — BRAIN NATRIURETIC PEPTIDE: B Natriuretic Peptide: 100.3 pg/mL — ABNORMAL HIGH (ref 0.0–100.0)

## 2020-09-19 LAB — TROPONIN I (HIGH SENSITIVITY)
Troponin I (High Sensitivity): 12 ng/L (ref <18)
Troponin I (High Sensitivity): 11 ng/L (ref <18)

## 2020-09-19 LAB — LIPASE, BLOOD: Lipase: 27 U/L (ref 11–51)

## 2020-09-19 MED ORDER — LIDOCAINE VISCOUS HCL 2 % MT SOLN
15.0000 mL | Freq: Once | OROMUCOSAL | Status: AC
  Administered 2020-09-19: 15 mL via ORAL
  Filled 2020-09-19: qty 15

## 2020-09-19 MED ORDER — SUCRALFATE 1 G PO TABS: 1.0000 g | ORAL_TABLET | Freq: Three times a day (TID) | ORAL | 0 refills | Status: DC

## 2020-09-19 MED ORDER — PANTOPRAZOLE SODIUM 40 MG PO TBEC: 40.0000 mg | DELAYED_RELEASE_TABLET | Freq: Every day | ORAL | 0 refills | Status: DC

## 2020-09-19 MED ORDER — ALUM & MAG HYDROXIDE-SIMETH 200-200-20 MG/5ML PO SUSP
30.0000 mL | Freq: Once | ORAL | Status: AC
  Administered 2020-09-19: 30 mL via ORAL
  Filled 2020-09-19: qty 30

## 2020-09-19 MED ORDER — PANTOPRAZOLE SODIUM 40 MG PO TBEC
40.0000 mg | DELAYED_RELEASE_TABLET | Freq: Once | ORAL | Status: AC
  Administered 2020-09-19: 40 mg via ORAL
  Filled 2020-09-19: qty 1

## 2020-09-19 NOTE — ED Provider Notes (Signed)
Emergency Medicine Provider Triage Evaluation Note  Roc Streett , a 80 y.o. male  was evaluated in triage.  Pt complains of aching and burning chest pain since yesterday.  It is made worse by breathing and eating. He reports compliance with his medications.  Reports feeling over all week.  Tried a nitro which made him dizzy but helped his chest pain.  Review of Systems  Positive: Chest pain Negative: Leg swelling  Physical Exam  BP 130/89 (BP Location: Left Arm)   Pulse 67   Temp 98.1 F (36.7 C) (Oral)   Resp 16   SpO2 97%  Gen:   Awake, no distress   Resp:  Normal effort  MSK:   Moves extremities without difficulty  Other:  Patient is awake and alert.  Speech is not slurred.  Medical Decision Making  Medically screening exam initiated at 12:54 PM.  Appropriate orders placed.  Naszir Cott was informed that the remainder of the evaluation will be completed by another provider, this initial triage assessment does not replace that evaluation, and the importance of remaining in the ED until their evaluation is complete.  Note: Portions of this report may have been transcribed using voice recognition software. Every effort was made to ensure accuracy; however, inadvertent computerized transcription errors may be present    Ollen Gross 09/19/20 1301    Quintella Reichert, MD 09/19/20 1553

## 2020-09-19 NOTE — ED Triage Notes (Signed)
Pt here with reports of central non radiating chest pain onset yesterday. Pt also endorses nausea and dizziness with the pain. Pt did take 1 nitro with some relief. Pt states pain worse with inspiration. Hx heart failure.

## 2020-09-19 NOTE — ED Provider Notes (Signed)
[facility] EMERGENCY DEPARTMENT Provider Note   CSN: [id number] Arrival date & time: 09/19/20  1244     History Chief Complaint  Patient presents with  . Chest Pain    Juan [name] is a 80 y.o. male.   Chest Pain Pain location:  Epigastric Pain quality comment:  "feels like my acid reflux" Pain radiates to:  Does not radiate Pain severity:  Mild Onset quality:  Gradual Duration:  1 day Timing:  Intermittent Progression:  Worsening Chronicity:  Recurrent Context: eating   Context: not breathing, not drug use, not intercourse, not lifting, not movement, not raising an arm, not at rest, not stress and not trauma   Relieved by:  None tried Exacerbated by: eating. Associated symptoms: abdominal pain   Associated symptoms: no AICD problem, no altered mental status, no anorexia, no anxiety, no back pain, no claudication, no cough, no diaphoresis, no dizziness, no dysphagia, no fatigue, no fever, no headache, no heartburn, no lower extremity edema, no nausea, no near-syncope, no numbness, no orthopnea, no palpitations, no PND, no shortness of breath, no syncope, no vomiting and no weakness    Presents with 1 day of epigastric pain that he says is typical of his acid reflux.  Worse with eating.  Not worse with exertion.  Nonradiating.  Has not been taking his PPI. No shortness of breath.      Past Medical History:  Diagnosis Date  . Aortic atherosclerosis ([place])   . Back pain   . Barrett esophagus   . Bleeding nose   . CAD in native artery 07/30/2020  . DDD (degenerative disc disease), thoracolumbar    Moderate to severe  . Diabetes mellitus without complication ([place])   . Diverticulosis   . Esophageal cancer ([place]) dx'd 2017  . Essential hypertension 07/30/2020  . GERD (gastroesophageal reflux disease)   . Headache   . Hearing loss    Right ear  . History of umbilical hernia repair   . Idiopathic peripheral neuropathy   . Iron deficiency anemia   . LBBB  (left bundle branch block) 02/11/2016  . Left hip pain    Severe degenerative changes  . Lipoma of neck    Right  . Mixed hyperlipidemia   . PONV (postoperative nausea and vomiting)   . Reflux   . Right thyroid nodule   . Sciatica 07/30/2020    Patient Active Problem List   Diagnosis Date Noted  . Essential hypertension 07/30/2020  . CAD in native artery 07/30/2020  . Sciatica 07/30/2020  . Chronic combined systolic and diastolic heart failure ([place]) 01/14/2020  . Emphysema lung ([place]) 01/14/2020  . Aortic atherosclerosis ([place]) 01/14/2020  . Chest pain, rule out acute myocardial infarction 01/13/2020  . DM2 (diabetes mellitus, type 2) ([place]) 01/13/2020  . Elevated blood-pressure reading without diagnosis of hypertension 01/13/2020  . Osteoarthritis of left hip 07/22/2017  . Iron deficiency anemia 05/17/2017  . LBBB (left bundle branch block) 02/11/2016  . Port catheter in place 12/25/2015  . 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 Esophageal cancer ([place]) 11/22/2015  . Noise effect on both inner ears 08/29/2015  . Right thyroid nodule 08/29/2015    Past Surgical History:  Procedure Laterality Date  . BALLOON DILATION N/A 05/06/2018   Procedure: BALLOON DILATION;  Surgeon: Carol Ada, MD;  Location: [facility];  Service: Endoscopy;  Laterality: N/A;  . COLONOSCOPY    . ESOPHAGOGASTRODUODENOSCOPY (EGD) WITH PROPOFOL N/A 05/06/2018   Procedure:  ESOPHAGOGASTRODUODENOSCOPY (EGD) WITH PROPOFOL;  Surgeon: Carol Ada, MD;  Location: [facility];  Service: Endoscopy;  Laterality: N/A;  . EUS N/A 12/05/2015   Procedure: UPPER ENDOSCOPIC ULTRASOUND (EUS) LINEAR;  Surgeon: Carol Ada, MD;  Location: [facility];  Service: Endoscopy;  Laterality: N/A;  . EUS N/A 03/10/2016   Procedure: UPPER ENDOSCOPIC ULTRASOUND (EUS) LINEAR;  Surgeon: Carol Ada, MD;  Location: [facility];  Service: Endoscopy;  Laterality: N/A;  . EUS N/A 01/07/2017    Procedure: UPPER ENDOSCOPIC ULTRASOUND (EUS) LINEAR;  Surgeon: Carol Ada, MD;  Location: [facility];  Service: Endoscopy;  Laterality: N/A;  . HERNIA REPAIR    . IR GENERIC HISTORICAL  12/24/2015   IR FLUORO GUIDE PORT INSERTION RIGHT 12/24/2015 Arne Cleveland, MD [facility]  . IR GENERIC HISTORICAL  12/24/2015   IR US GUIDE VASC ACCESS RIGHT 12/24/2015 Arne Cleveland, MD [facility]  . IR REMOVAL TUN ACCESS W/ PORT W/O FL MOD SED  02/07/2018  . PORTA CATH INSERTION    . RIGHT/LEFT HEART CATH AND CORONARY ANGIOGRAPHY N/A 01/15/2020   Procedure: RIGHT/LEFT HEART CATH AND CORONARY ANGIOGRAPHY;  Surgeon: Nelva Bush, MD;  [facility];  Service: Cardiovascular;  Laterality: N/A;  . SHOULDER ARTHROSCOPY W/ SUPERIOR LABRAL ANTERIOR POSTERIOR LESION REPAIR     times 2  . TOTAL HIP ARTHROPLASTY Left 07/22/2017   Procedure: LEFT TOTAL HIP ARTHROPLASTY ANTERIOR APPROACH;  Surgeon: Rod Can, MD;  Location: [facility];  Service: Orthopedics;  Laterality: Left;  . UPPER GI ENDOSCOPY  01/07/2017       Family History  Problem Relation Age of Onset  . Colon cancer Father 8  . Colon cancer Brother 92  . Heart attack Brother   . Heart disease Mother   . Heart attack Mother   . Diabetes Sister   . Stroke Sister     Social History   Tobacco Use  . Smoking status: Former    Packs/day: 2.00    Years: 40.00    Pack years: 80.00    Types: Cigarettes    Quit date: 03/31/1999    Years since quitting: 21.4  . Smokeless tobacco: Never  Vaping Use  . Vaping Use: Never used  Substance Use Topics  . Alcohol use: Not Currently  . Drug use: No    Home Medications Prior to Admission medications   Medication Sig Start Date End Date Taking? Authorizing Provider  sucralfate (CARAFATE) 1 g tablet Take 1 tablet (1 g total) by mouth 4 (four) times daily -  with meals and at bedtime for 7 days. 09/19/20 09/26/20 Yes Pearson Grippe, DO  albuterol (VENTOLIN HFA) 108 (90 Base)  MCG/ACT inhaler Inhale 2 puffs into the lungs every 6 (six) hours as needed for wheezing or shortness of breath.  06/14/19   [provider]  amitriptyline (ELAVIL) 10 MG tablet Take 10 mg by mouth at bedtime. 03/11/18   [provider]  aspirin EC 81 MG tablet Take 81 mg by mouth daily.    [provider]  atorvastatin (LIPITOR) 80 MG tablet Take 1 tablet (80 mg total) by mouth daily at 6 PM. 09/05/20   Skeet Latch, MD  benzonatate (TESSALON) 100 MG capsule Take 1 capsule (100 mg total) by mouth 3 (three) times daily as needed for cough. 12/28/19   Katy Apo, NP  carvedilol (COREG) 3.125 MG tablet Take 1 tablet (3.125 mg total) by mouth 2 (two) times daily with a meal. 09/05/20   Skeet Latch,  MD  celecoxib (CELEBREX) 200 MG capsule Take 200 mg by mouth daily. 04/17/19   [provider]  cetirizine (ZYRTEC ALLERGY) 10 MG tablet Take 1 tablet (10 mg total) by mouth daily. Patient taking differently: Take 10 mg by mouth daily as needed for allergies or rhinitis. 11/25/19   Hall-Potvin, [place], PA-C  doxycycline (VIBRAMYCIN) 100 MG capsule Take 1 capsule (100 mg total) by mouth 2 (two) times daily. 05/09/20   Scot Jun, FNP  empagliflozin (JARDIANCE) 10 MG TABS tablet Take 1 tablet (10 mg total) by mouth daily. 01/18/20   Hosie Poisson, MD  fluticasone (FLONASE) 50 MCG/ACT nasal spray Place 1 spray into both nostrils daily. Patient taking differently: Place 1 spray into both nostrils daily as needed for allergies or rhinitis. 11/25/19   Hall-Potvin, [place], PA-C  gabapentin (NEURONTIN) 300 MG capsule Take 300 mg by mouth in the morning and at bedtime.     [provider]  Multiple Vitamins-Minerals (CENTRUM SILVER 50+MEN) TABS Take 1 tablet by mouth daily with breakfast.    [provider]  nitroGLYCERIN (NITROSTAT) 0.4 MG SL tablet PLACE 1 TABLET (0.4 MG TOTAL) UNDER THE TONGUE EVERY FIVE MINUTES AS NEEDED FOR CHEST PAIN.  01/17/20 01/16/21  Hosie Poisson, MD  pantoprazole (PROTONIX) 40 MG tablet Take 1 tablet (40 mg total) by mouth daily before breakfast. 09/19/20 10/19/20  Pearson Grippe, DO  sacubitril-valsartan (ENTRESTO) 24-26 MG Take 1 tablet by mouth 2 (two) times daily. 01/17/20   Hosie Poisson, MD  spironolactone (ALDACTONE) 25 MG tablet Take 1 tablet (25 mg total) by mouth daily. 09/05/20   Skeet Latch, MD  Grant Ruts INHUB 250-50 MCG/DOSE AEPB Inhale 1 puff into the lungs daily.    [provider]  furosemide (LASIX) 20 MG tablet Take 1 tablet (20 mg total) by mouth daily. Patient not taking: Reported on 07/16/2019 07/02/18 07/23/19  [name], MD    Allergies    Patient has no known allergies.  Review of Systems   Review of Systems  Constitutional:  Negative for chills, diaphoresis, fatigue and fever.  HENT:  Negative for ear pain, sore throat and trouble swallowing.   Eyes:  Negative for pain and visual disturbance.  Respiratory:  Negative for cough and shortness of breath.   Cardiovascular:  Positive for chest pain. Negative for palpitations, orthopnea, claudication, syncope, PND and near-syncope.  Gastrointestinal:  Positive for abdominal pain. Negative for anorexia, heartburn, nausea and vomiting.  Genitourinary:  Negative for dysuria and hematuria.  Musculoskeletal:  Negative for arthralgias and back pain.  Skin:  Negative for color change and rash.  Neurological:  Negative for dizziness, seizures, syncope, weakness, numbness and headaches.  All other systems reviewed and are negative.  Physical Exam Updated Vital Signs BP (!) 146/82   Pulse (!) 54   Temp 98.1 F (36.7 C) (Oral)   Resp 16   SpO2 99%   Physical Exam Vitals and nursing note reviewed.  Constitutional:      Appearance: He is well-developed.  HENT:     Head: Normocephalic and atraumatic.  Eyes:     Conjunctiva/sclera: Conjunctivae normal.  Cardiovascular:     Rate and Rhythm: Regular rhythm.  Bradycardia present.     Pulses:          Radial pulses are 2+ on the right side and 2+ on the left side.       Dorsalis pedis pulses are 2+ on the right side and 2+ on the left side.  Heart sounds: Normal heart sounds. No murmur heard. Pulmonary:     Effort: Pulmonary effort is normal. No tachypnea, accessory muscle usage or respiratory distress.     Breath sounds: Normal breath sounds. No decreased air movement. No rales.  Abdominal:     General: Abdomen is flat.     Palpations: Abdomen is soft.     Tenderness: There is abdominal tenderness (mild) in the epigastric area.  Musculoskeletal:     Cervical back: Neck supple.     Right lower leg: No edema.     Left lower leg: No edema.  Skin:    General: Skin is warm and dry.  Neurological:     General: No focal deficit present.     Mental Status: He is alert.     GCS: GCS eye subscore is 4. GCS verbal subscore is 5. GCS motor subscore is 6.     Cranial Nerves: Cranial nerves are intact.     Sensory: Sensation is intact.     Motor: Motor function is intact.    ED Results / Procedures / Treatments   Labs (all labs ordered are listed, but only abnormal results are displayed) Labs Reviewed  COMPREHENSIVE METABOLIC PANEL - Abnormal; Notable for the following components:      Result Value   Glucose, Bld 110 (*)    All other components within normal limits  CBC WITH DIFFERENTIAL/PLATELET - Abnormal; Notable for the following components:   Hemoglobin 12.7 (*)    MCV 78.6 (*)    MCH 24.2 (*)    All other components within normal limits  BRAIN NATRIURETIC PEPTIDE - Abnormal; Notable for the following components:   B Natriuretic Peptide 100.3 (*)    All other components within normal limits  LIPASE, BLOOD  TROPONIN I (HIGH SENSITIVITY)  TROPONIN I (HIGH SENSITIVITY)    EKG EKG Interpretation  Date/Time:  Thursday September 19 2020 12:52:56 EDT Ventricular Rate:  63 PR Interval:  184 QRS Duration: 160 QT Interval:  444 QTC  Calculation: 454 R Axis:   -6 Text Interpretation: Sinus rhythm with marked sinus arrhythmia Left bundle branch block Abnormal ECG Confirmed by Quintella Reichert [phone]) on 09/19/2020 12:56:46 PM  Radiology DG Chest 2 View  Result Date: 09/19/2020 CLINICAL DATA:  Chest pain EXAM: CHEST - 2 VIEW COMPARISON:  05/09/2020 FINDINGS: The heart size and mediastinal contours are within normal limits. Both lungs are clear. Disc degenerative disease of the thoracic spine. IMPRESSION: No acute abnormality of the lungs. Electronically Signed   By: Eddie Candle M.D.   On: 09/19/2020 13:33    Procedures Procedures   Medications Ordered in ED Medications  alum & mag hydroxide-simeth (MAALOX/MYLANTA) 200-200-20 MG/5ML suspension 30 mL (30 mLs Oral Given 09/19/20 2137)    And  lidocaine (XYLOCAINE) 2 % viscous mouth solution 15 mL (15 mLs Oral Given 09/19/20 2137)  pantoprazole (PROTONIX) EC tablet 40 mg (40 mg Oral Given 09/19/20 2137)    ED Course  I have reviewed the triage vital signs and the nursing notes.  Pertinent labs & imaging results that were available during my care of the patient were reviewed by me and considered in my medical decision making (see chart for details).  Clinical Course as of 09/19/20 2254  Thu Sep 19, 2020  2057 DG Chest 2 View [ZB]    Clinical Course User Index [ZB] Pearson Grippe, DO   MDM Rules/Calculators/A&P  This is an 80 year old male with a history of coronary artery disease, well-controlled high blood pressure, esophageal reflux and hyperlipidemia who presented to the emergency department in the setting of epigastric abdominal pain similar to acid reflux that he gets periodically. Has been out of p.o. Protonix for couple days. Did consider ACS though history much more consistent with esophageal reflux. EKG as above without any evidence of acute ischemic changes. Delta troponins negative. I reviewed right and left heart cath from  October 2021 which showed mild to moderate, nonobstructive coronary artery disease. Symptoms resolved after treatment with GI cocktail.  Do not feel as though further ACS work-up indicated at this time. I also gave him a p.o. dose of his Protonix in the emergency department. Presentation, work-up not consistent with CHF exacerbation.  Not concern for COPD exacerbation.  Discharged on p.o. home dose Protonix, Carafate 3 times daily with meals and follow-up in the outpatient setting with his primary care doctor.  Final Clinical Impression(s) / ED Diagnoses Final diagnoses:  Atypical chest pain  Gastroesophageal reflux disease, unspecified whether esophagitis present    Rx / DC Orders ED Discharge Orders          Ordered    pantoprazole (PROTONIX) 40 MG tablet  Daily before breakfast        09/19/20 2239    sucralfate (CARAFATE) 1 g tablet  3 times daily with meals & bedtime        09/19/20 [place], [place], DO 09/19/20 [place], [place], DO 09/19/20 2316

## 2020-11-22 ENCOUNTER — Ambulatory Visit (HOSPITAL_BASED_OUTPATIENT_CLINIC_OR_DEPARTMENT_OTHER): Payer: 59 | Admitting: Cardiovascular Disease

## 2021-01-13 ENCOUNTER — Emergency Department (HOSPITAL_COMMUNITY)
Admission: EM | Admit: 2021-01-13 | Discharge: 2021-01-13 | Disposition: A | Discharge status: Home / Self Care | Payer: 59 | Attending: Emergency Medicine | Admitting: Emergency Medicine

## 2021-01-13 ENCOUNTER — Encounter: Payer: Self-pay | Admitting: Hematology

## 2021-01-13 ENCOUNTER — Emergency Department (HOSPITAL_COMMUNITY): Payer: 59

## 2021-01-13 ENCOUNTER — Other Ambulatory Visit: Payer: Self-pay

## 2021-01-13 DIAGNOSIS — R52 Pain, unspecified: Secondary | ICD-10-CM

## 2021-01-13 DIAGNOSIS — Z96642 Presence of left artificial hip joint: Secondary | ICD-10-CM | POA: Insufficient documentation

## 2021-01-13 DIAGNOSIS — I110 Hypertensive heart disease with heart failure: Secondary | ICD-10-CM | POA: Diagnosis not present

## 2021-01-13 DIAGNOSIS — I5042 Chronic combined systolic (congestive) and diastolic (congestive) heart failure: Secondary | ICD-10-CM | POA: Insufficient documentation

## 2021-01-13 DIAGNOSIS — S2241XA Multiple fractures of ribs, right side, initial encounter for closed fracture: Secondary | ICD-10-CM | POA: Diagnosis not present

## 2021-01-13 DIAGNOSIS — S299XXA Unspecified injury of thorax, initial encounter: Secondary | ICD-10-CM | POA: Diagnosis present

## 2021-01-13 DIAGNOSIS — Y92410 Unspecified street and highway as the place of occurrence of the external cause: Secondary | ICD-10-CM | POA: Diagnosis not present

## 2021-01-13 DIAGNOSIS — Z8501 Personal history of malignant neoplasm of esophagus: Secondary | ICD-10-CM | POA: Insufficient documentation

## 2021-01-13 DIAGNOSIS — Z7984 Long term (current) use of oral hypoglycemic drugs: Secondary | ICD-10-CM | POA: Diagnosis not present

## 2021-01-13 DIAGNOSIS — I2510 Atherosclerotic heart disease of native coronary artery without angina pectoris: Secondary | ICD-10-CM | POA: Diagnosis not present

## 2021-01-13 DIAGNOSIS — Z79899 Other long term (current) drug therapy: Secondary | ICD-10-CM | POA: Diagnosis not present

## 2021-01-13 DIAGNOSIS — E119 Type 2 diabetes mellitus without complications: Secondary | ICD-10-CM | POA: Diagnosis not present

## 2021-01-13 DIAGNOSIS — Z7982 Long term (current) use of aspirin: Secondary | ICD-10-CM | POA: Insufficient documentation

## 2021-01-13 DIAGNOSIS — Z87891 Personal history of nicotine dependence: Secondary | ICD-10-CM | POA: Diagnosis not present

## 2021-01-13 DIAGNOSIS — S32302A Unspecified fracture of left ilium, initial encounter for closed fracture: Secondary | ICD-10-CM | POA: Insufficient documentation

## 2021-01-13 LAB — COMPREHENSIVE METABOLIC PANEL
ALT: 24 U/L (ref 0–44)
AST: 46 U/L — ABNORMAL HIGH (ref 15–41)
Albumin: 3.9 g/dL (ref 3.5–5.0)
Alkaline Phosphatase: 68 U/L (ref 38–126)
Anion gap: 8 (ref 5–15)
BUN: 8 mg/dL (ref 8–23)
CO2: 25 mmol/L (ref 22–32)
Calcium: 9.2 mg/dL (ref 8.9–10.3)
Chloride: 105 mmol/L (ref 98–111)
Creatinine, Ser: 1.27 mg/dL — ABNORMAL HIGH (ref 0.61–1.24)
GFR, Estimated: 57 mL/min — ABNORMAL LOW (ref >60)
Glucose, Bld: 123 mg/dL — ABNORMAL HIGH (ref 70–99)
Potassium: 5.4 mmol/L — ABNORMAL HIGH (ref 3.5–5.1)
Sodium: 138 mmol/L (ref 135–145)
Total Bilirubin: 1.5 mg/dL — ABNORMAL HIGH (ref 0.3–1.2)
Total Protein: 7 g/dL (ref 6.5–8.1)

## 2021-01-13 LAB — URINALYSIS, ROUTINE W REFLEX MICROSCOPIC
Bacteria, UA: NONE SEEN
Bilirubin Urine: NEGATIVE
Glucose, UA: >=500 mg/dL — AB
Ketones, ur: NEGATIVE mg/dL
Leukocytes,Ua: NEGATIVE
Nitrite: NEGATIVE
Protein, ur: NEGATIVE mg/dL
Specific Gravity, Urine: 1.017 (ref 1.005–1.030)
pH: 5 (ref 5.0–8.0)

## 2021-01-13 LAB — CBC WITH DIFFERENTIAL/PLATELET
Abs Immature Granulocytes: 0.07 10*3/uL (ref 0.00–0.07)
Basophils Absolute: 0 10*3/uL (ref 0.0–0.1)
Basophils Relative: 0 %
Eosinophils Absolute: 0 10*3/uL (ref 0.0–0.5)
Eosinophils Relative: 0 %
HCT: 41.3 % (ref 39.0–52.0)
Hemoglobin: 12.6 g/dL — ABNORMAL LOW (ref 13.0–17.0)
Immature Granulocytes: 1 %
Lymphocytes Relative: 7 %
Lymphs Abs: 0.7 10*3/uL (ref 0.7–4.0)
MCH: 24.1 pg — ABNORMAL LOW (ref 26.0–34.0)
MCHC: 30.5 g/dL (ref 30.0–36.0)
MCV: 79.1 fL — ABNORMAL LOW (ref 80.0–100.0)
Monocytes Absolute: 0.7 10*3/uL (ref 0.1–1.0)
Monocytes Relative: 7 %
Neutro Abs: 8.8 10*3/uL — ABNORMAL HIGH (ref 1.7–7.7)
Neutrophils Relative %: 85 %
Platelets: 279 10*3/uL (ref 150–400)
RBC: 5.22 MIL/uL (ref 4.22–5.81)
RDW: 14.8 % (ref 11.5–15.5)
WBC: 10.3 10*3/uL (ref 4.0–10.5)
nRBC: 0 % (ref 0.0–0.2)

## 2021-01-13 MED ORDER — IOHEXOL 350 MG/ML SOLN
80.0000 mL | Freq: Once | INTRAVENOUS | Status: AC | PRN
  Administered 2021-01-13: 80 mL via INTRAVENOUS

## 2021-01-13 MED ORDER — OXYCODONE-ACETAMINOPHEN 5-325 MG PO TABS
1.0000 | ORAL_TABLET | Freq: Once | ORAL | Status: AC
  Administered 2021-01-13: 1 via ORAL
  Filled 2021-01-13: qty 1

## 2021-01-13 MED ORDER — IBUPROFEN 600 MG PO TABS: 600.0000 mg | ORAL_TABLET | Freq: Four times a day (QID) | ORAL | 0 refills | Status: DC | PRN

## 2021-01-13 NOTE — ED Notes (Signed)
Pt verbalizes understanding of discharge instructions. Opportunity for questions and answers were provided. Pt discharged from the ED.   ?
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
## 2021-01-13 NOTE — ED Notes (Signed)
Patient transported to CT 
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
## 2021-02-07 ENCOUNTER — Encounter: Payer: Self-pay | Admitting: Hematology

## 2021-05-02 DIAGNOSIS — N1832 Chronic kidney disease, stage 3b: Secondary | ICD-10-CM | POA: Diagnosis not present

## 2021-05-02 DIAGNOSIS — G720 Drug-induced myopathy: Secondary | ICD-10-CM | POA: Diagnosis not present

## 2021-05-02 DIAGNOSIS — E1165 Type 2 diabetes mellitus with hyperglycemia: Secondary | ICD-10-CM | POA: Diagnosis not present

## 2021-05-02 DIAGNOSIS — E782 Mixed hyperlipidemia: Secondary | ICD-10-CM | POA: Diagnosis not present

## 2021-05-08 ENCOUNTER — Telehealth (HOSPITAL_BASED_OUTPATIENT_CLINIC_OR_DEPARTMENT_OTHER): Payer: Self-pay | Admitting: Cardiovascular Disease

## 2021-05-08 ENCOUNTER — Encounter: Payer: Self-pay | Admitting: Hematology

## 2021-05-08 DIAGNOSIS — R0602 Shortness of breath: Secondary | ICD-10-CM | POA: Diagnosis not present

## 2021-05-08 DIAGNOSIS — E1169 Type 2 diabetes mellitus with other specified complication: Secondary | ICD-10-CM | POA: Diagnosis not present

## 2021-05-08 DIAGNOSIS — N1832 Chronic kidney disease, stage 3b: Secondary | ICD-10-CM | POA: Diagnosis not present

## 2021-05-08 DIAGNOSIS — E782 Mixed hyperlipidemia: Secondary | ICD-10-CM | POA: Diagnosis not present

## 2021-05-08 DIAGNOSIS — G609 Hereditary and idiopathic neuropathy, unspecified: Secondary | ICD-10-CM | POA: Diagnosis not present

## 2021-05-08 NOTE — Telephone Encounter (Signed)
Left VM requesting call back on cell phone 05/08/21 at 1:30PM. Attempted home phone but number not in service.   Loel Dubonnet, NP

## 2021-05-08 NOTE — Telephone Encounter (Signed)
Pt c/o of Chest Pain: STAT if CP now or developed within 24 hours  1. Are you having CP right now? no  2. Are you experiencing any other symptoms (ex. SOB, nausea, vomiting, sweating)? Sweating  3. How long have you been experiencing CP? Last week  4. Is your CP continuous or coming and going? continuous  5. Have you taken Nitroglycerin? No  Schedule appt on 2/14 at 1030 ?

## 2021-05-08 NOTE — Telephone Encounter (Signed)
Patient returning call.

## 2021-05-08 NOTE — Telephone Encounter (Signed)
Spoke with Juan [name]. He was offered appointment this afternoon which he declined.   Chest pain started 1 week. Notes it happened "all day" yesterday. He me tells it feels like something is moving in his chest. Feels a sharp pain every 20-30 minutes. Hurts most when he lays down. No exertional dyspnea nor chest discomfort. Felt like he might throw up yesterday.   Has not taken Tylenol, Tums, Ibuprofen, Nitroglycerin. Does not think he has Nitroglycerin at home.   He endorses compliance with Protonix daily. Recommend increase to BID until follow up appointment. Likely etiology GERD. Verbalized understanding of ED precautions if he has persistent chest apin. Marland Kitchen   He has an appointment scheduled 05/13/21 at 10:30AM. He did not have the address and it was provided to him.   Loel Dubonnet, NP

## 2021-05-12 ENCOUNTER — Encounter (HOSPITAL_COMMUNITY): Payer: Self-pay | Admitting: Pharmacy Technician

## 2021-05-13 ENCOUNTER — Other Ambulatory Visit (HOSPITAL_BASED_OUTPATIENT_CLINIC_OR_DEPARTMENT_OTHER): Payer: Self-pay

## 2021-05-13 ENCOUNTER — Encounter (HOSPITAL_BASED_OUTPATIENT_CLINIC_OR_DEPARTMENT_OTHER): Payer: Self-pay | Admitting: Family

## 2021-05-13 ENCOUNTER — Other Ambulatory Visit: Payer: Self-pay

## 2021-05-13 ENCOUNTER — Ambulatory Visit (HOSPITAL_BASED_OUTPATIENT_CLINIC_OR_DEPARTMENT_OTHER): Payer: Medicare HMO | Admitting: Family

## 2021-05-13 VITALS — BP 148/96 | HR 58 | Ht 73.0 in | Wt 201.0 lb

## 2021-05-13 DIAGNOSIS — I447 Left bundle-branch block, unspecified: Secondary | ICD-10-CM

## 2021-05-13 DIAGNOSIS — I25118 Atherosclerotic heart disease of native coronary artery with other forms of angina pectoris: Secondary | ICD-10-CM | POA: Diagnosis not present

## 2021-05-13 DIAGNOSIS — I5020 Unspecified systolic (congestive) heart failure: Secondary | ICD-10-CM | POA: Diagnosis not present

## 2021-05-13 DIAGNOSIS — I739 Peripheral vascular disease, unspecified: Secondary | ICD-10-CM | POA: Diagnosis not present

## 2021-05-13 DIAGNOSIS — Z79899 Other long term (current) drug therapy: Secondary | ICD-10-CM

## 2021-05-13 DIAGNOSIS — I255 Ischemic cardiomyopathy: Secondary | ICD-10-CM | POA: Diagnosis not present

## 2021-05-13 MED ORDER — FUROSEMIDE 20 MG PO TABS
20.0000 mg | ORAL_TABLET | Freq: Every day | ORAL | 0 refills | Status: AC
  Filled 2021-05-13: qty 30, 30d supply, fill #0

## 2021-05-13 NOTE — Patient Instructions (Addendum)
Medication Instructions:  Your physician has recommended you make the following change in your medication:   RESUME Furosemide (Lasix) one 20mg  tablet daily We have sent the prescription to the pharmacy downstairs.   *If you need a refill on your cardiac medications before your next appointment, please call your pharmacy*  Lab Work: Your physician recommends that you return for lab work today: lipid panel, CMET, CBC  If you have labs (blood work) drawn today and your tests are completely normal, you will receive your results only by: MyChart Message (if you have MyChart) OR A paper copy in the mail If you have any lab test that is abnormal or we need to change your treatment, we will call you to review the results.  Testing/Procedures: Your EKG today was stable.   Your physician has requested that you have an echocardiogram. Echocardiography is a painless test that uses sound waves to create images of your heart. It provides your doctor with information about the size and shape of your heart and how well your hearts chambers and valves are working. This procedure takes approximately one hour. There are no restrictions for this procedure.   Your physician has requested that you have an ankle brachial index (ABI). During this test an ultrasound and blood pressure cuff are used to evaluate the arteries that supply the arms and legs with blood. Allow thirty minutes for this exam. There are no restrictions or special instructions. This will let us evaluate the blood flow in your legs.     Follow-Up: At [facility], you and your health needs are our priority.  As part of our continuing mission to provide you with exceptional heart care, we have created designated Provider Care Teams.  These Care Teams include your primary Cardiologist (physician) and Advanced Practice Providers (APPs -  Physician Assistants and Nurse Practitioners) who all work together to provide you with the care you need,  when you need it.  We recommend signing up for the patient portal called "MyChart".  Sign up information is provided on this After Visit Summary.  MyChart is used to connect with patients for Virtual Visits (Telemedicine).  Patients are able to view lab/test results, encounter notes, upcoming appointments, etc.  Non-urgent messages can be sent to your provider as well.   To learn more about what you can do with MyChart, go to [email or web address].    Your next appointment:   3 month(s)  The format for your next appointment:   In Person  Provider:   Skeet Latch, MD or Advanced Practice Provider    Other Instructions  Heart Healthy Diet Recommendations: A low-salt diet is recommended. Meats should be grilled, baked, or boiled. Avoid fried foods. Focus on lean protein sources like fish or chicken with vegetables and fruits. The American Heart Association is a Microbiologist!  American Heart Association Diet and Lifeystyle Recommendations    Recommend drinking no more than 2 liters (64 oz) of fluid per day.  ___________________  Exercise recommendations: The American Heart Association recommends 150 minutes of moderate intensity exercise weekly. Try 30 minutes of moderate intensity exercise 4-5 times per week. This could include walking, jogging, or swimming.    __________________  Food Choices for Gastroesophageal Reflux Disease, Adult When you have gastroesophageal reflux disease (GERD), the foods you eat and your eating habits are very important. Choosing the right foods can help ease your discomfort. Think about working with a food expert (dietitian) to help you make good choices. What are tips  for following this plan? Reading food labels Look for foods that are low in saturated fat. Foods that may help with your symptoms include: Foods that have less than 5% of daily value (DV) of fat. Foods that have 0 grams of trans fat. Cooking Do not fry your food. Cook your food  by baking, steaming, grilling, or broiling. These are all methods that do not need a lot of fat for cooking. To add flavor, try to use herbs that are low in spice and acidity. Meal planning  Choose healthy foods that are low in fat, such as: Fruits and vegetables. Whole grains. Low-fat dairy products. Lean meats, fish, and poultry. Eat small meals often instead of eating 3 large meals each day. Eat your meals slowly in a place where you are relaxed. Avoid bending over or lying down until 2-3 hours after eating. Limit high-fat foods such as fatty meats or fried foods. Limit your intake of fatty foods, such as oils, butter, and shortening. Avoid the following as told by your doctor: Foods that cause symptoms. These may be different for different people. Keep a food diary to keep track of foods that cause symptoms. Alcohol. Drinking a lot of liquid with meals. Eating meals during the 2-3 hours before bed. Lifestyle Stay at a healthy weight. Ask your doctor what weight is healthy for you. If you need to lose weight, work with your doctor to do so safely. Exercise for at least 30 minutes on 5 or more days each week, or as told by your doctor. Wear loose-fitting clothes. Do not smoke or use any products that contain nicotine or tobacco. If you need help quitting, ask your doctor. Sleep with the head of your bed higher than your feet. Use a wedge under the mattress or blocks under the bed frame to raise the head of the bed. Chew sugar-free gum after meals. What foods should eat? Eat a healthy, well-balanced diet of fruits, vegetables, whole grains, low-fat dairy products, lean meats, fish, and poultry. Each person is different. Foods that may cause symptoms in one person may not cause any symptoms in another person. Work with your doctor to find foods that are safe for you. The items listed above may not be a complete list of what you can eat and drink. Contact a food expert for more  options. What foods should I avoid? Limiting some of these foods may help in managing the symptoms of GERD. Everyone is different. Talk with a food expert or your doctor to help you find the exact foods to avoid, if any. Fruits Any fruits prepared with added fat. Any fruits that cause symptoms. For some people, this may include citrus fruits, such as oranges, grapefruit, pineapple, and lemons. Vegetables Deep-fried vegetables. [place] fries. Any vegetables prepared with added fat. Any vegetables that cause symptoms. For some people, this may include tomatoes and tomato products, chili peppers, onions and garlic, and horseradish. Grains Pastries or quick breads with added fat. Meats and other proteins High-fat meats, such as fatty beef or pork, hot dogs, ribs, ham, sausage, salami, and bacon. Fried meat or protein, including fried fish and fried chicken. Nuts and nut butters, in large amounts. Dairy Whole milk and chocolate milk. Sour cream. Cream. Ice cream. Cream cheese. Milkshakes. Fats and oils Butter. Margarine. Shortening. Ghee. Beverages Coffee and tea, with or without caffeine. Carbonated beverages. Sodas. Energy drinks. Fruit juice made with acidic fruits, such as orange or grapefruit. Tomato juice. Alcoholic drinks. Sweets and desserts Chocolate  and cocoa. Donuts. Seasonings and condiments Pepper. Peppermint and spearmint. Added salt. Any condiments, herbs, or seasonings that cause symptoms. For some people, this may include curry, hot sauce, or vinegar-based salad dressings. The items listed above may not be a complete list of what you should not eat and drink. Contact a food expert for more options. Questions to ask your doctor Diet and lifestyle changes are often the first steps that are taken to manage symptoms of GERD. If diet and lifestyle changes do not help, talk with your doctor about taking medicines. Where to find more information International Foundation for  Gastrointestinal Disorders: aboutgerd.org Summary When you have GERD, food and lifestyle choices are very important in easing your symptoms. Eat small meals often instead of 3 large meals a day. Eat your meals slowly and in a place where you are relaxed. Avoid bending over or lying down until 2-3 hours after eating. Limit high-fat foods such as fatty meats or fried foods. This information is not intended to replace advice given to you by your health care provider. Make sure you discuss any questions you have with your health care provider. Document Revised: 09/25/2019 Document Reviewed: 09/25/2019 Elsevier Patient Education  [place].

## 2021-05-13 NOTE — Progress Notes (Signed)
Office Visit    Patient Name: Juan [name] Date of Encounter: 05/14/2021  PCP:  [name], [name]), MD (Inactive)   [place]  Cardiologist:  Skeet Latch, MD  Advanced Practice Provider:  No care team member to display Electrophysiologist:  None      Chief Complaint    Juan [name] is a 81 y.o. male with a hx of CAD, HFrEF, left bundle branch block, esophageal cancer s/p chemo/XRT, hyperlipidemia, prior tobacco use  presents today for chest pain.  Past Medical History    Past Medical History:  Diagnosis Date   Aortic atherosclerosis (HCC)    Back pain    Barrett esophagus    Bleeding nose    CAD in native artery 07/30/2020   DDD (degenerative disc disease), thoracolumbar    Moderate to severe   Diabetes mellitus without complication ([place])    Diverticulosis    Esophageal cancer ([place]) dx'd 2017   Essential hypertension 07/30/2020   GERD (gastroesophageal reflux disease)    Headache    Hearing loss    Right ear   History of umbilical hernia repair    Idiopathic peripheral neuropathy    Iron deficiency anemia    LBBB (left bundle branch block) 02/11/2016   Left hip pain    Severe degenerative changes   Lipoma of neck    Right   Mixed hyperlipidemia    PONV (postoperative nausea and vomiting)    Reflux    Right thyroid nodule    Sciatica 07/30/2020   Past Surgical History:  Procedure Laterality Date   BALLOON DILATION N/A 05/06/2018   Procedure: BALLOON DILATION;  Surgeon: Carol Ada, MD;  Location: [facility];  Service: Endoscopy;  Laterality: N/A;   COLONOSCOPY     ESOPHAGOGASTRODUODENOSCOPY (EGD) WITH PROPOFOL N/A 05/06/2018   Procedure: ESOPHAGOGASTRODUODENOSCOPY (EGD) WITH PROPOFOL;  Surgeon: Carol Ada, MD;  Location: [facility];  Service: Endoscopy;  Laterality: N/A;   EUS N/A 12/05/2015   Procedure: UPPER ENDOSCOPIC ULTRASOUND (EUS) LINEAR;  Surgeon: Carol Ada, MD;  Location: [facility];  Service: Endoscopy;   Laterality: N/A;   EUS N/A 03/10/2016   Procedure: UPPER ENDOSCOPIC ULTRASOUND (EUS) LINEAR;  Surgeon: Carol Ada, MD;  Location: [facility];  Service: Endoscopy;  Laterality: N/A;   EUS N/A 01/07/2017   Procedure: UPPER ENDOSCOPIC ULTRASOUND (EUS) LINEAR;  Surgeon: Carol Ada, MD;  Location: [place];  Service: Endoscopy;  Laterality: N/A;   HERNIA REPAIR     IR GENERIC HISTORICAL  12/24/2015   IR FLUORO GUIDE PORT INSERTION RIGHT 12/24/2015 Arne Cleveland, MD [facility]   IR GENERIC HISTORICAL  12/24/2015   IR US GUIDE VASC ACCESS RIGHT 12/24/2015 Arne Cleveland, MD [facility]   IR REMOVAL TUN ACCESS W/ PORT W/O FL MOD SED  02/07/2018   PORTA CATH INSERTION     RIGHT/LEFT HEART CATH AND CORONARY ANGIOGRAPHY N/A 01/15/2020   Procedure: RIGHT/LEFT HEART CATH AND CORONARY ANGIOGRAPHY;  Surgeon: Nelva Bush, MD;  Location: [facility];  Service: Cardiovascular;  Laterality: N/A;   SHOULDER ARTHROSCOPY W/ SUPERIOR LABRAL ANTERIOR POSTERIOR LESION REPAIR     times 2   TOTAL HIP ARTHROPLASTY Left 07/22/2017   Procedure: LEFT TOTAL HIP ARTHROPLASTY ANTERIOR APPROACH;  Surgeon: Rod Can, MD;  Location: [facility];  Service: Orthopedics;  Laterality: Left;   UPPER GI ENDOSCOPY  01/07/2017    Allergies  No Known Allergies  History of Present Illness    Juan [name] is a 81  y.o. male with a hx of CAD, HFrEF, left bundle branch block, esophageal cancer s/p chemo/XRT, hyperlipidemia, prior tobacco use last seen 07/30/2020 by Dr. Oval Linsey.  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  He underwent chemotherapy (carboplatin, Taxol ? Abraxane) with XRT.  He quit smoking 2001 after 75-pack-year history.  He quit drinking alcohol in 2000 after drinking heavily in the past.  He was admitted October 2021 with chest pain and shortness of breath with echo revealing LVEF 25%.  LHC revealed mild to moderate nonobstructive coronary disease.  He was started on  carvedilol, Entresto, spironolactone, Jardiance.  He was last seen 08/02/2020 with atypical chest pain most often when lying down.  No ischemic evaluation was recommended.  He was recommended for follow-up in 3 months.  He contacted the office 05/08/2021 noting chest pain over the last week.  He was offered same-day appointment which she declined.  He reported it felt like something moving in his chest as well as a sharp pain every 12 to 30 minutes most often when lying down.  No exertional dyspnea or chest pain.  He did have nausea today for.  He was recommended to increase his Protonix to twice daily until follow-up.  He has not taken nitroglycerin.  Labs per primary care: 05/02/21 A1c 6.7 05/02/21 Total cholesterol 161, HDL 58, LDL 82, triglycerides 103 05/02/21 ALT 21, AST 21, creatinine 1.02, GFR 80  He presents today for follow-up.  Shares with me that he just retired from the [employer] transfer within the last year.  He lives alone as he and his wife separated in 2019.  He does have children and grandchildren who live locally and active support system.  He gets around through public transportation.  He reports bilateral lower extremity pain which is worse with ambulation.  He notes left-sided chest discomfort described as sharp pain periodically throughout the day that is worse when laying down.  Also notes orthopnea.  He he is not presently taking Lasix though unclear why discontinued.  His medication list is overall confusing as he is uncertain what he takes and did not bring his pill bottles today.  EKGs/Labs/Other Studies Reviewed:   The following studies were reviewed today:  Echo 01/14/20  1. Left ventricular ejection fraction, by estimation, is 25%. The left  ventricle has severely decreased function. The left ventricle demonstrates  regional wall motion abnormalities (see scoring diagram/findings for  description). There is moderate left  ventricular hypertrophy. Left ventricular diastolic  parameters are  consistent with Grade II diastolic dysfunction (pseudonormalization).   2. Right ventricular systolic function is normal. The right ventricular  size is normal. Tricuspid regurgitation signal is inadequate for assessing  PA pressure.   3. Left atrial size was mild to moderately dilated.   4. The mitral valve is grossly normal. Mild mitral valve regurgitation.  No evidence of mitral stenosis.   5. The aortic valve is tricuspid. There is mild calcification of the  aortic valve. There is mild thickening of the aortic valve. Aortic valve  regurgitation is trivial. No aortic stenosis is present.   6. The inferior vena cava is normal in size with greater than 50%  respiratory variability, suggesting right atrial pressure of 3 mmHg.   Conclusion(s)/Recommendation(s): No left ventricular mural or apical  thrombus/thrombi.   R/LHC 01/15/20 Conclusions: Mild to moderate, non-obstructive coronary artery disease. Upper normal to mildly elevated left heart filling pressures. Normal right heart and pulmonary artery pressures. Moderately reduced Fick cardiac output/index.  Recommendations: Escalate evidence based heart failure therapy as tolerated for management of non-ischemic cardiomyopathy. Medical therapy and risk factor modification to prevent progression of coronary artery disease.  EKG:  EKG is  ordered today.  The ekg ordered today demonstrates SB 58 bpm with 1st degree AV block (PR 210) and stable left axis deviation.   Recent Labs: 09/19/2020: B Natriuretic Peptide 100.3 05/13/2021: ALT 10; BUN 6; Creatinine, Ser 0.98; Hemoglobin 13.9; Platelets 283; Potassium 4.1; Sodium 144  Recent Lipid Panel    Component Value Date/Time   CHOL 174 05/13/2021 1130   TRIG 78 05/13/2021 1130   HDL 59 05/13/2021 1130   CHOLHDL 2.9 05/13/2021 1130   CHOLHDL 3.2 01/25/2020 0845   VLDL 13 01/25/2020 0845   LDLCALC 100 (H) 05/13/2021 1130    Home Medications   Current Meds   Medication Sig   albuterol (VENTOLIN HFA) 108 (90 Base) MCG/ACT inhaler Inhale 2 puffs into the lungs every 6 (six) hours as needed for wheezing or shortness of breath.    amitriptyline (ELAVIL) 10 MG tablet Take 10 mg by mouth at bedtime.   aspirin EC 81 MG tablet Take 81 mg by mouth daily.   atorvastatin (LIPITOR) 80 MG tablet Take 1 tablet (80 mg total) by mouth daily at 6 PM.   carvedilol (COREG) 3.125 MG tablet Take 1 tablet (3.125 mg total) by mouth 2 (two) times daily with a meal.   cetirizine (ZYRTEC ALLERGY) 10 MG tablet Take 1 tablet (10 mg total) by mouth daily. (Patient taking differently: Take 10 mg by mouth daily as needed for allergies or rhinitis.)   empagliflozin (JARDIANCE) 10 MG TABS tablet Take 1 tablet (10 mg total) by mouth daily.   fluticasone (FLONASE) 50 MCG/ACT nasal spray Place 1 spray into both nostrils daily. (Patient taking differently: Place 1 spray into both nostrils daily as needed for allergies or rhinitis.)   furosemide (LASIX) 20 MG tablet Take 1 tablet (20 mg total) by mouth daily.   gabapentin (NEURONTIN) 300 MG capsule Take 300 mg by mouth in the morning and at bedtime.    Multiple Vitamins-Minerals (CENTRUM SILVER 50+MEN) TABS Take 1 tablet by mouth daily with breakfast.   sacubitril-valsartan (ENTRESTO) 24-26 MG Take 1 tablet by mouth 2 (two) times daily.   spironolactone (ALDACTONE) 25 MG tablet Take 1 tablet (25 mg total) by mouth daily.   WIXELA INHUB 250-50 MCG/DOSE AEPB Inhale 1 puff into the lungs daily.   [DISCONTINUED] celecoxib (CELEBREX) 200 MG capsule Take 200 mg by mouth daily.   [DISCONTINUED] ibuprofen (ADVIL) 600 MG tablet Take 1 tablet (600 mg total) by mouth every 6 (six) hours as needed.     Review of Systems      All other systems reviewed and are otherwise negative except as noted above.  Physical Exam    VS:  BP (!) 148/96 (BP Location: Left Arm, Patient Position: Sitting, Cuff Size: Normal)    Pulse (!) 58    Ht 6\' 1"  (1.854 m)     Wt 201 lb (91.2 kg)    BMI 26.52 kg/m  , BMI Body mass index is 26.52 kg/m.  Wt Readings from Last 3 Encounters:  05/13/21 201 lb (91.2 kg)  01/13/21 170 lb (77.1 kg)  07/30/20 203 lb (92.1 kg)    GEN: Well nourished, well developed, in no acute distress. HEENT: normal. Neck: Supple, no JVD, carotid bruits, or masses. Cardiac: RRR, no murmurs, rubs, or gallops. No clubbing, cyanosis, Bilateral non pitting LE  edema..  Radials 2+ and equal bilaterally. PT 1+ and equal bilaterally.  Respiratory:  Respirations regular and unlabored, clear to auscultation bilaterally. GI: Soft, nontender, nondistended. MS: No deformity or atrophy. Skin: Warm and dry, no rash. Neuro:  Strength and sensation are intact. Psych: Normal affect.  Assessment & Plan    Medication management - Medication management difficult as he did not bring pill bottles and uncertain what he is taking. May benefit from pill pack or delivery service as he lives alone and uses public transportation. When we have medications optimized, consider transition to [facility] as they deliver pill packs.  Per pharmacy he is up to date on: Carvedilol, Spironolactone, Protonix Per pharmacy he is out of: Atorvastatin, Jardiance. Per pharmacy never picked up: Entresto.  We will ask him to bring his pill bottles to his upcoming echocardiogram appointment so we can clarify which medications he has at home.  HFrEF -12/2019 LVEF 25%.  Concern for volume overload based on orthopnea, weight from PCP office. Resume Lasix 20mg  QD. Continue Spironolactone, Coreg. Never picked up Entresto per pharmacy. He is not taking Jardiance (last fill 09/2020)  but PCP office lists Farxiga 5mg  on med list. Update echo to reassess LVEF. Bring pill bottles to that appointment. Low salt diet, fluid restriction <2L recommended.   Claudication - Bilateral calf pain with ambulation. Plan for ABI/vas arterial duplex to rule out significant PAD. Bilateral PT  1+.  Hypertension - BP elevated today though did not yet take his medications.  Unclear what medications he is taking during clinic and able to update after visit with his pharmacy. Has never picked up [facility] though prescribed >1 year ago. Consider Losartan at follow up.   Left bundle branch block -Stable finding by EKG. Continue monitoring with perioidic EKG.   COPD - Based on radiologic findings per PCP. No signs of exacerbation. Continue to follow with PCP.   DM2 - Continue to follow with PCP. Per PCP  notes on Glimepiride, Farxiga 5mg . Unclear which medications he is taking. 04/2021 A1c 6.7.   CKD III - Careful titration of diuretic and antihypertensive.  CMP today.  CAD -mild to moderate coronary disease by cath 2021. Chest pain atypical for angina as occurs when laying down. EKG today no acute changes. No exertional symptoms. No indication for ischemic evaluation at this time. GDMT includes aspirin, atorvastatin, carvedilol, jardiance. Of note - has been out of jardiance and atorvastatin. Refills provided. Heart healthy diet and regular cardiovascular exercise encouraged.    HLD - Lab collected during clinic visit with LDL 100. Per pharmacy has likely been out of Atorvastatin since 11/2020. Will send refill to pharmacy. Repeat labs at follow up.  Disposition: Follow up in 3 month(s) with Skeet Latch, MD or APP.  Signed, Loel Dubonnet, NP 05/14/2021, 8:05 AM [place]

## 2021-05-14 ENCOUNTER — Encounter (HOSPITAL_BASED_OUTPATIENT_CLINIC_OR_DEPARTMENT_OTHER): Payer: Self-pay

## 2021-05-14 ENCOUNTER — Telehealth (HOSPITAL_BASED_OUTPATIENT_CLINIC_OR_DEPARTMENT_OTHER): Payer: Self-pay

## 2021-05-14 LAB — LIPID PANEL
Chol/HDL Ratio: 2.9 ratio (ref 0.0–5.0)
Cholesterol, Total: 174 mg/dL (ref 100–199)
HDL: 59 mg/dL (ref >39)
LDL Chol Calc (NIH): 100 mg/dL — ABNORMAL HIGH (ref 0–99)
Triglycerides: 78 mg/dL (ref 0–149)
VLDL Cholesterol Cal: 15 mg/dL (ref 5–40)

## 2021-05-14 LAB — CBC
Hematocrit: 45.4 % (ref 37.5–51.0)
Hemoglobin: 13.9 g/dL (ref 13.0–17.7)
MCH: 23.5 pg — ABNORMAL LOW (ref 26.6–33.0)
MCHC: 30.6 g/dL — ABNORMAL LOW (ref 31.5–35.7)
MCV: 77 fL — ABNORMAL LOW (ref 79–97)
Platelets: 283 10*3/uL (ref 150–450)
RBC: 5.92 x10E6/uL — ABNORMAL HIGH (ref 4.14–5.80)
RDW: 16.4 % — ABNORMAL HIGH (ref 11.6–15.4)
WBC: 5.5 10*3/uL (ref 3.4–10.8)

## 2021-05-14 LAB — COMPREHENSIVE METABOLIC PANEL
ALT: 10 IU/L (ref 0–44)
AST: 17 IU/L (ref 0–40)
Albumin/Globulin Ratio: 1.5 (ref 1.2–2.2)
Albumin: 4.6 g/dL (ref 3.7–4.7)
Alkaline Phosphatase: 100 IU/L (ref 44–121)
BUN/Creatinine Ratio: 6 — ABNORMAL LOW (ref 10–24)
BUN: 6 mg/dL — ABNORMAL LOW (ref 8–27)
Bilirubin Total: 0.7 mg/dL (ref 0.0–1.2)
CO2: 21 mmol/L (ref 20–29)
Calcium: 9.3 mg/dL (ref 8.6–10.2)
Chloride: 104 mmol/L (ref 96–106)
Creatinine, Ser: 0.98 mg/dL (ref 0.76–1.27)
Globulin, Total: 3 g/dL (ref 1.5–4.5)
Glucose: 127 mg/dL — ABNORMAL HIGH (ref 70–99)
Potassium: 4.1 mmol/L (ref 3.5–5.2)
Sodium: 144 mmol/L (ref 134–144)
Total Protein: 7.6 g/dL (ref 6.0–8.5)
eGFR: 78 mL/min/{1.73_m2} (ref >59)

## 2021-05-14 MED ORDER — ATORVASTATIN CALCIUM 80 MG PO TABS: 80.0000 mg | ORAL_TABLET | Freq: Every day | ORAL | 3 refills | Status: DC

## 2021-05-14 MED ORDER — NITROGLYCERIN 0.4 MG SL SUBL: SUBLINGUAL_TABLET | SUBLINGUAL | 12 refills | Status: AC | PRN

## 2021-05-14 MED ORDER — ATORVASTATIN CALCIUM 40 MG PO TABS: 40.0000 mg | ORAL_TABLET | Freq: Every day | ORAL | 3 refills | Status: DC

## 2021-05-14 NOTE — Telephone Encounter (Addendum)
Results called to patient who verbalizes understanding and was informed to bring his pill bottles to his upcoming echo appointment to be reviewed.    Results tab says to take 40mg  daily but 80mg  was called into the pharmacy. Will clarify with Laurann Montana, NP!   Spoke with Laurann Montana, NP the dosing should be 40mg . Correct dose sent to pharmacy and call placed to clarify with pharmacy!    ----- Message from Loel Dubonnet, NP sent at 05/14/2021  8:11 AM EST ----- Cholesterol not at goal. CBC with no evidence of significant anemia nor infection.  Normal kidney, liver, electrolytes. Start Atorvastatin 40mg  QD for cholesterol. Rx sent to his pharmacy.  Marked difference between our med list, PCP med list, and pharmacy med list. Please ask him to bring pill bottles to his echo appt so we can review.

## 2021-05-22 ENCOUNTER — Ambulatory Visit (INDEPENDENT_AMBULATORY_CARE_PROVIDER_SITE_OTHER): Payer: Medicare HMO

## 2021-05-22 ENCOUNTER — Other Ambulatory Visit: Payer: Self-pay

## 2021-05-22 DIAGNOSIS — I739 Peripheral vascular disease, unspecified: Secondary | ICD-10-CM

## 2021-05-22 DIAGNOSIS — I5020 Unspecified systolic (congestive) heart failure: Secondary | ICD-10-CM | POA: Diagnosis not present

## 2021-05-22 LAB — ECHOCARDIOGRAM COMPLETE
AR max vel: 2.06 cm2
AV Area VTI: 2.41 cm2
AV Area mean vel: 2.07 cm2
AV Mean grad: 3 mmHg
AV Peak grad: 6.2 mmHg
Ao pk vel: 1.24 m/s
Area-P 1/2: 2.03 cm2
Calc EF: 54 %
S' Lateral: 3.59 cm
Single Plane A2C EF: 58.3 %
Single Plane A4C EF: 48.4 %

## 2021-05-22 MED ORDER — PERFLUTREN LIPID MICROSPHERE
1.0000 mL | INTRAVENOUS | Status: AC | PRN
  Administered 2021-05-22: 2 mL via INTRAVENOUS

## 2021-05-23 ENCOUNTER — Encounter (HOSPITAL_BASED_OUTPATIENT_CLINIC_OR_DEPARTMENT_OTHER): Payer: Self-pay | Admitting: Family

## 2021-05-23 ENCOUNTER — Telehealth (HOSPITAL_BASED_OUTPATIENT_CLINIC_OR_DEPARTMENT_OTHER): Payer: Self-pay

## 2021-05-23 ENCOUNTER — Other Ambulatory Visit (HOSPITAL_BASED_OUTPATIENT_CLINIC_OR_DEPARTMENT_OTHER): Payer: Self-pay | Admitting: Family

## 2021-05-23 DIAGNOSIS — I739 Peripheral vascular disease, unspecified: Secondary | ICD-10-CM

## 2021-05-23 NOTE — Telephone Encounter (Addendum)
Results called to patient who verbalizes understanding!      ----- Message from Loel Dubonnet, NP sent at 05/23/2021 12:30 PM EST ----- Echocardiogram shows mildly reduced heart pumping function. Has improved from 25% now to 40-45%. Trivial leaking of mitral valve. Small PFO unable to be excluded. No medication changes at this time. Please bring pill bottles to next appt and follow up as scheduled.  Please call ABI results at same time.    Loel Dubonnet, NP  Lower extremities show abnormal blood flow. Recommend establish with Dr. Gwenlyn Found or Dr. Fletcher Anon as they are our cardiologists specializing in PAD. I have placed referral and he will receive a call to schedule.   Please call echo results at same time.

## 2021-05-23 NOTE — Telephone Encounter (Signed)
Patient called again requesting to speak to [place], [place]. He did not realize he had been called to schedule appt to establish with Dr. Gwenlyn Found or Dr. Fletcher Anon due to PAD.   He was scheduled in new patient slot with Dr. Gwenlyn Found on 06/13/21 at 10:45 AM with Dr. Gwenlyn Found.   Will route to scheduler who attempted to schedule from referral so they are aware.   Mailed patient letter with appointment information.   Loel Dubonnet, NP

## 2021-05-23 NOTE — Telephone Encounter (Signed)
Patient calling back to discuss if his medications were changed.

## 2021-05-23 NOTE — Telephone Encounter (Signed)
Updated patient.

## 2021-05-28 DIAGNOSIS — E782 Mixed hyperlipidemia: Secondary | ICD-10-CM | POA: Diagnosis not present

## 2021-05-28 DIAGNOSIS — N1832 Chronic kidney disease, stage 3b: Secondary | ICD-10-CM | POA: Diagnosis not present

## 2021-05-28 DIAGNOSIS — E1169 Type 2 diabetes mellitus with other specified complication: Secondary | ICD-10-CM | POA: Diagnosis not present

## 2021-05-28 DIAGNOSIS — G609 Hereditary and idiopathic neuropathy, unspecified: Secondary | ICD-10-CM | POA: Diagnosis not present

## 2021-06-13 ENCOUNTER — Ambulatory Visit: Payer: Medicare HMO | Admitting: Cardiovascular Disease

## 2021-06-13 ENCOUNTER — Encounter: Payer: Self-pay | Admitting: Cardiovascular Disease

## 2021-06-13 ENCOUNTER — Other Ambulatory Visit: Payer: Self-pay

## 2021-06-13 DIAGNOSIS — I739 Peripheral vascular disease, unspecified: Secondary | ICD-10-CM | POA: Diagnosis not present

## 2021-06-13 NOTE — Progress Notes (Signed)
? ? ? ?06/13/2021 ?Juan [name]   ?Jul 04, 1940  ?[id number] ? ?Primary Physician [name], [name]), MD (Inactive) ?Primary Cardiologist: Lorretta Harp MD Lupe Carney, [place] ? ?HPI:  Juan [name] is a 81 y.o. 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  He is a cardiology patient of Dr. Oval Linsey.  He recently saw Laurann Montana, NP in the office 05/14/2021 who ordered Doppler studies for the evaluation of lower extremity discomfort.  He does have a history of treated hypertension, diabetes and hyperlipidemia.  He underwent right left heart cath by Dr. Saunders Revel 01/15/2020 revealing nonobstructive CAD with severe LV dysfunction consistent with a nonischemic cardiomyopathy.  He is on appropriate medications for this.  He complains of cramping in his legs when he lies down in bed at night and also when sitting down but not with ambulation.  Dopplers performed 05/22/2021 revealed a right ABI 1.03, left of 0.84 with some mild to moderate SFA and posterior tibial artery disease probably noncontributing to his symptoms. ? ? ?Current Meds  ?Medication Sig  ? albuterol (VENTOLIN HFA) 108 (90 Base) MCG/ACT inhaler Inhale 2 puffs into the lungs every 6 (six) hours as needed for wheezing or shortness of breath.   ? amitriptyline (ELAVIL) 10 MG tablet Take 10 mg by mouth at bedtime.  ? aspirin EC 81 MG tablet Take 81 mg by mouth daily.  ? atorvastatin (LIPITOR) 40 MG tablet Take 1 tablet (40 mg total) by mouth daily.  ? carvedilol (COREG) 3.125 MG tablet Take 1 tablet (3.125 mg total) by mouth 2 (two) times daily with a meal.  ? cetirizine (ZYRTEC ALLERGY) 10 MG tablet Take 1 tablet (10 mg total) by mouth daily. (Patient taking differently: Take 10 mg by mouth daily as needed for allergies or rhinitis.)  ? empagliflozin (JARDIANCE) 10 MG TABS tablet Take 1 tablet (10 mg total) by mouth daily.  ? FARXIGA 5 MG TABS tablet Take  by mouth.  ? fluticasone (FLONASE) 50 MCG/ACT nasal spray Place 1 spray into both nostrils daily. (Patient taking differently: Place 1 spray into both nostrils daily as needed for allergies or rhinitis.)  ? gabapentin (NEURONTIN) 300 MG capsule Take 300 mg by mouth in the morning and at bedtime.   ? glimepiride (AMARYL) 1 MG tablet Take 1 mg by mouth every morning.  ? Multiple Vitamins-Minerals (CENTRUM SILVER 50+MEN) TABS Take 1 tablet by mouth daily with breakfast.  ? nitroGLYCERIN (NITROSTAT) 0.4 MG SL tablet PLACE 1 TABLET (0.4 MG TOTAL) UNDER THE TONGUE EVERY FIVE MINUTES AS NEEDED FOR CHEST PAIN.  ? oxyCODONE-acetaminophen (PERCOCET) 10-325 MG tablet Take by mouth.  ? sacubitril-valsartan (ENTRESTO) 24-26 MG Take 1 tablet by mouth 2 (two) times daily.  ? spironolactone (ALDACTONE) 25 MG tablet Take 1 tablet (25 mg total) by mouth daily.  ? tiZANidine (ZANAFLEX) 2 MG tablet Take by mouth.  ? WIXELA INHUB 250-50 MCG/DOSE AEPB Inhale 1 puff into the lungs daily.  ?  ? ?No Known Allergies ? ?Social History  ? ?Socioeconomic History  ? Marital status: Legally Separated  ?  Spouse name: Not on file  ? Number of children: Not on file  ? Years of education: Not on file  ? Highest education level: Not on file  ?Occupational History  ? Not on file  ?Tobacco Use  ? Smoking status: Former  ?  Packs/day: 2.00  ?  Years: 40.00  ?  Pack years:  80.00  ?  Types: Cigarettes  ?  Quit date: 03/31/1999  ?  Years since quitting: 22.2  ? Smokeless tobacco: Never  ?Vaping Use  ? Vaping Use: Never used  ?Substance and Sexual Activity  ? Alcohol use: Not Currently  ? Drug use: No  ? Sexual activity: Not on file  ?Other Topics Concern  ? Not on file  ?Social History Narrative  ? ** Merged History Encounter **  ?    ? 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 ?Social Determinants of Health  ? ?Financial Resource Strain: Not on file  ?Food Insecurity: Not on file  ?Transportation Needs: Not on file   ?Physical Activity: Not on file  ?Stress: Not on file  ?Social Connections: Not on file  ?Intimate Partner Violence: Not on file  ?  ? ?Review of Systems: ?General: negative for chills, fever, night sweats or weight changes.  ?Cardiovascular: negative for chest pain, dyspnea on exertion, edema, orthopnea, palpitations, paroxysmal nocturnal dyspnea or shortness of breath ?Dermatological: negative for rash ?Respiratory: negative for cough or wheezing ?Urologic: negative for hematuria ?Abdominal: negative for nausea, vomiting, diarrhea, bright red blood per rectum, melena, or hematemesis ?Neurologic: negative for visual changes, syncope, or dizziness ?All other systems reviewed and are otherwise negative except as noted above. ? ? ? ?Blood pressure 138/90, pulse 99, height '5\' 9"'$  (1.753 m), weight 198 lb 12.8 oz (90.2 kg), SpO2 93 %.  ?General appearance: alert and no distress ?Neck: no adenopathy, no carotid bruit, no JVD, supple, symmetrical, trachea midline, and thyroid not enlarged, symmetric, no tenderness/mass/nodules ?Lungs: clear to auscultation bilaterally ?Heart: regular rate and rhythm, S1, S2 normal, no murmur, click, rub or gallop ?Extremities: extremities normal, atraumatic, no cyanosis or edema ?Pulses: 2+ and symmetric ?Skin: Skin color, texture, turgor normal. No rashes or lesions ?Neurologic: Grossly normal ? ?EKG not performed today ? ?ASSESSMENT AND PLAN:  ? ?Peripheral arterial disease ([place]) ?[name] was referred to me by Laurann Montana, NP for evaluation of PAD.  He does have risk factors for CAD although he has nonobstructive disease by cath performed by Dr. Saunders Revel 01/15/2020.  He says he gets pain and cramping in his legs when he is lying down at night and also when sitting but not when he is ambulating.  I suspect he has restless leg syndrome.  He did have Dopplers performed 05/22/2021 revealing a right ABI of 1.03 and a left of 0.84.  He did have mild right SFA disease with an occluded right  posterior tibial, moderate left SFA disease with an moderate disease in his left posterior tibial.  I do not think these are obstructive or causing him to have symptoms.  No further evaluation is warranted at this time. ? ? ? ? ?Lorretta Harp MD FACP,FACC,FAHA, FSCAI ?06/13/2021 ?11:19 AM ?

## 2021-06-13 NOTE — Patient Instructions (Signed)
Medication Instructions:  ?No changes ?*If you need a refill on your cardiac medications before your next appointment, please call your pharmacy* ? ? ?Lab Work: ?None ordered ?If you have labs (blood work) drawn today and your tests are completely normal, you will receive your results only by: ?MyChart Message (if you have MyChart) OR ?A paper copy in the mail ?If you have any lab test that is abnormal or we need to change your treatment, we will call you to review the results. ? ? ?Testing/Procedures: ?None ordered ? ? ?Follow-Up: ?At [facility], you and your health needs are our priority.  As part of our continuing mission to provide you with exceptional heart care, we have created designated Provider Care Teams.  These Care Teams include your primary Cardiologist (physician) and Advanced Practice Providers (APPs -  Physician Assistants and Nurse Practitioners) who all work together to provide you with the care you need, when you need it. ? ?We recommend signing up for the patient portal called "MyChart".  Sign up information is provided on this After Visit Summary.  MyChart is used to connect with patients for Virtual Visits (Telemedicine).  Patients are able to view lab/test results, encounter notes, upcoming appointments, etc.  Non-urgent messages can be sent to your provider as well.   ?To learn more about what you can do with MyChart, go to [email or web address].   ? ?Your next appointment:   ?Follow up as needed with Dr. Gwenlyn Found ?

## 2021-06-13 NOTE — Assessment & Plan Note (Signed)
Juan [name] was referred to me by Laurann Montana, NP for evaluation of PAD.  He does have risk factors for CAD although he has nonobstructive disease by cath performed by Dr. Saunders Revel 01/15/2020.  He says he gets pain and cramping in his legs when he is lying down at night and also when sitting but not when he is ambulating.  I suspect he has restless leg syndrome.  He did have Dopplers performed 05/22/2021 revealing a right ABI of 1.03 and a left of 0.84.  He did have mild right SFA disease with an occluded right posterior tibial, moderate left SFA disease with an moderate disease in his left posterior tibial.  I do not think these are obstructive or causing him to have symptoms.  No further evaluation is warranted at this time. ?

## 2021-07-10 ENCOUNTER — Other Ambulatory Visit (HOSPITAL_BASED_OUTPATIENT_CLINIC_OR_DEPARTMENT_OTHER): Payer: Self-pay | Admitting: Cardiovascular Disease

## 2021-08-11 ENCOUNTER — Encounter: Payer: Self-pay | Admitting: Hematology

## 2021-08-21 ENCOUNTER — Encounter: Payer: Self-pay | Admitting: Hematology

## 2021-08-21 ENCOUNTER — Ambulatory Visit (HOSPITAL_BASED_OUTPATIENT_CLINIC_OR_DEPARTMENT_OTHER): Payer: Medicare HMO | Admitting: Cardiovascular Disease

## 2021-08-21 ENCOUNTER — Encounter (HOSPITAL_BASED_OUTPATIENT_CLINIC_OR_DEPARTMENT_OTHER): Payer: Self-pay | Admitting: Cardiovascular Disease

## 2021-08-21 ENCOUNTER — Ambulatory Visit (INDEPENDENT_AMBULATORY_CARE_PROVIDER_SITE_OTHER): Payer: Medicare HMO

## 2021-08-21 VITALS — BP 118/78 | HR 60 | Ht 69.0 in | Wt 199.8 lb

## 2021-08-21 DIAGNOSIS — I700 Atherosclerosis of aorta: Secondary | ICD-10-CM

## 2021-08-21 DIAGNOSIS — R252 Cramp and spasm: Secondary | ICD-10-CM | POA: Diagnosis not present

## 2021-08-21 DIAGNOSIS — Z5181 Encounter for therapeutic drug level monitoring: Secondary | ICD-10-CM

## 2021-08-21 DIAGNOSIS — E785 Hyperlipidemia, unspecified: Secondary | ICD-10-CM

## 2021-08-21 DIAGNOSIS — K219 Gastro-esophageal reflux disease without esophagitis: Secondary | ICD-10-CM

## 2021-08-21 DIAGNOSIS — I5042 Chronic combined systolic (congestive) and diastolic (congestive) heart failure: Secondary | ICD-10-CM | POA: Diagnosis not present

## 2021-08-21 DIAGNOSIS — R002 Palpitations: Secondary | ICD-10-CM

## 2021-08-21 DIAGNOSIS — I2510 Atherosclerotic heart disease of native coronary artery without angina pectoris: Secondary | ICD-10-CM

## 2021-08-21 HISTORY — DX: Gastro-esophageal reflux disease without esophagitis: K21.9

## 2021-08-21 MED ORDER — CARVEDILOL 3.125 MG PO TABS: 3.1250 mg | ORAL_TABLET | Freq: Two times a day (BID) | ORAL | 2 refills | Status: DC

## 2021-08-21 MED ORDER — ATORVASTATIN CALCIUM 40 MG PO TABS: 40.0000 mg | ORAL_TABLET | Freq: Every day | ORAL | 3 refills | Status: DC

## 2021-08-21 MED ORDER — PANTOPRAZOLE SODIUM 40 MG PO TBEC: 40.0000 mg | DELAYED_RELEASE_TABLET | Freq: Every day | ORAL | 1 refills | Status: AC

## 2021-08-21 NOTE — Patient Instructions (Addendum)
Medication Instructions:  Your physician recommends that you continue on your current medications as directed. Please refer to the Current Medication list given to you today.  *If you need a refill on your cardiac medications before your next appointment, please call your pharmacy*  Lab Work: LP/CMET/MAGNESIUM TODAY   If you have labs (blood work) drawn today and your tests are completely normal, you will receive your results only by: [place] (if you have MyChart) OR A paper copy in the mail If you have any lab test that is abnormal or we need to change your treatment, we will call you to review the results.   Testing/Procedures: NONE  Follow-Up: At [facility], you and your health needs are our priority.  As part of our continuing mission to provide you with exceptional heart care, we have created designated Provider Care Teams.  These Care Teams include your primary Cardiologist (physician) and Advanced Practice Providers (APPs -  Physician Assistants and Nurse Practitioners) who all work together to provide you with the care you need, when you need it.  We recommend signing up for the patient portal called "MyChart".  Sign up information is provided on this After Visit Summary.  MyChart is used to connect with patients for Virtual Visits (Telemedicine).  Patients are able to view lab/test results, encounter notes, upcoming appointments, etc.  Non-urgent messages can be sent to your provider as well.   To learn more about what you can do with MyChart, go to [email or web address].    Your next appointment:   4 month(s)  The format for your next appointment:   In Person  Provider:   Laurann Montana, NP   [place] Instructions  Your physician has requested you wear a ZIO patch monitor for 7 days.  This is a single patch monitor. [employer] supplies one patch monitor per enrollment. Additional stickers are not available. Please do not  apply patch if you will be having a Nuclear Stress Test,  Echocardiogram, Cardiac CT, MRI, or Chest Xray during the period you would be wearing the  monitor. The patch cannot be worn during these tests. You cannot remove and re-apply the  ZIO XT patch monitor.  Your ZIO patch monitor will be mailed 3 day USPS to your address on file. It may take 3-5 days  to receive your monitor after you have been enrolled.  Once you have received your monitor, please review the enclosed instructions. Your monitor  has already been registered assigning a specific monitor serial # to you.  Billing and Patient Assistance Program Information  We have supplied [employer] with any of your insurance information on file for billing purposes. [employer] offers a sliding scale Patient Assistance Program for patients that do not have  insurance, or whose insurance does not completely cover the cost of the ZIO monitor.  You must apply for the Patient Assistance Program to qualify for this discounted rate.  To apply, please call [employer] at [phone], select option 4, select option 2, ask to apply for  Patient Assistance Program. Theodore Demark will ask your household income, and how many people  are in your household. They will quote your out-of-pocket cost based on that information.  [employer] will also be able to set up a 38-month interest-free payment plan if needed.  Applying the monitor   Shave hair from upper left chest.  Hold abrader disc by orange tab. Rub abrader in 40 strokes over the upper left  chest as  indicated in your monitor instructions.  Clean area with 4 enclosed alcohol pads. Let dry.  Apply patch as indicated in monitor instructions. Patch will be placed under collarbone on left  side of chest with arrow pointing upward.  Rub patch adhesive wings for 2 minutes. Remove white label marked "1". Remove the white  label marked "2". Rub patch adhesive wings for 2 additional minutes.  While looking in a mirror,  press and release button in center of patch. A small green light will  flash 3-4 times. This will be your only indicator that the monitor has been turned on.  Do not shower for the first 24 hours. You may shower after the first 24 hours.  Press the button if you feel a symptom. You will hear a small click. Record Date, Time and  Symptom in the Patient Logbook.  When you are ready to remove the patch, follow instructions on the last 2 pages of Patient  Logbook. Stick patch monitor onto the last page of Patient Logbook.  Place Patient Logbook in the blue and white box. Use locking tab on box and tape box closed  securely. The blue and white box has prepaid postage on it. Please place it in the mailbox as  soon as possible. Your physician should have your test results approximately 7 days after the  monitor has been mailed back to [facility].  Call [place] at [phone] if you have questions regarding  your ZIO XT patch monitor. Call them immediately if you see an orange light blinking on your  monitor.  If your monitor falls off in less than 4 days, contact our Monitor department at [phone].  If your monitor becomes loose or falls off after 4 days call [employer] at [phone] for  suggestions on securing your monitor

## 2021-08-21 NOTE — Assessment & Plan Note (Addendum)
LVEF 40 to 45% with grade 1 diastolic dysfunction on echo 04/2021.  This was improved from 20 to 25% when he was first diagnosed with heart failure in 2021.  Cath then revealed nonobstructive CAD.  He is euvolemic and doing well.  Continue carvedilol, Wilder Glade, Entresto, and spironolactone.  He also takes Lasix 20 mg daily.  Check a CMP and magnesium today as he also reports leg cramps.

## 2021-08-21 NOTE — Assessment & Plan Note (Signed)
Moderate non-obstructive CAD on cath in 2021.  He has no ischemic symptoms.  Continue aspirin, carvedilol, and atorvastatin.  LDL goal is less than 70.  He was restarted on atorvastatin in February.  He notes that his leg cramps were ongoing since prior to that appointment and if anything are less now than before.  I do not think this is due to myalgias.  Therefore we will continue the atorvastatin.

## 2021-08-21 NOTE — Progress Notes (Unsigned)
Enrolled patient for a 7 day Zio XT monitor to be mailed to patients home.  

## 2021-08-21 NOTE — Assessment & Plan Note (Signed)
Noted on imaging.  LDL goal is less than 70.  Check fasting lipids and a CMP today.  Continue aspirin and atorvastatin.

## 2021-08-21 NOTE — Assessment & Plan Note (Signed)
He reports symptoms of acid reflux that are worsened with coffee.  He does not have exertional chest pain.  No plans for repeat ischemic evaluation at this time.

## 2021-08-21 NOTE — Progress Notes (Signed)
Cardiology Office Note:    Date:  08/21/2021   ID:  Juan [name], DOB 1940/11/13, MRN [id number]  PCP:  Sofie Hartigan Rama Georgianne Fick), MD (Inactive)   [facility] Providers Cardiologist:  Chilton Si, MD     Referring MD: No ref. provider found   No chief complaint on file.   History of Present Illness:    Juan [name] is a 81 y.o. male with a hx of chronic systolic and diastolic heart failure, LBBB, esophageal cancer s/p chemo/XRT, hyperlipidemia, and prior tobacco abuse who presents for follow up. He was first seen 2017 for preoperative risk assessment prior to surgery for esophageal cancer.  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  He presented with a 20-25.lb weight loss over 3-4 months.  He under went chemotherapy (carboplatin, Taxol-->Abraxane) with XRT. He quit smoking in 2001 after a 75 pack year history.  He also quit drinking alcohol in 2000 after drinking heavily in the past.   He was hospitalized10/2021 with chest pain and shortness of breath. Echo revealed LVEF 25%. He had a left heart cath that revealed mild to moderate non-obstructive CAD. He was started on entresto, carvedilol, spirolactone, and jardiance. He saw Bettina Gavia, PE-C on 12/2019 and was doing well.  At his last appointment he complained of a quick, dull chest pain (5/10 severity) occurring about every other day over the prior 2 weeks. He had associated diaphoresis. He was also struggling with some orthopnea, leg and back pain not associated with his chest pain. Per his log, blood pressures were elevated at home but stable in clinic; proper technique was reviewed. It was recommended he follow up with his PCP regarding his bilateral radicular leg pain. He followed up with Gillian Shields 05/13/2021 and it was noted that he was uncertain of the medications he was taking. Per the pharmacy he had never picked up Entresto, and was out of atorvastatin and  Jardiance. It was recommended to consider transition to [facility] as they deliver pill packs. There was also concern for volume overload based on orthopnea and his weight from recent PCP visit. He was resumed on Lasix 20 mg QD. He had an Echo 04/2021 showing LVEF 40-45% and grade 1 diastolic dysfunction. Bilateral LE arterial dopplers 04/2021 revealed 30-49% stenosis in the right superficial femoral artery and total occlusion in the right posterior tibial artery. There was 50-74% stenosis in the left superficial femoral artery and 50-74% stenosis in the left posterior tibial artery. On 06/13/2021 he followed up with Dr. Allyson Sabal for evaluation of PAD. It was suspected that his leg pain and cramping was likely due to restless leg syndrome. His bilateral SFA disease was not felt to be obstructive or causing his symptoms; no further evaluation was recommended at that time.  Today, he reports feeling good. His chest discomfort is still present, but has been improving. He notes feeling a lot of acid reflux lately, exacerbated by drinking coffee or eating spicy foods. Sometimes he feels like his heart will "stop" or is not quite beating correctly. This lasts for a couple seconds, sometimes associated with dizziness. Also he is suffering from muscle cramps mostly localized to his dorsal LE bilaterally from his knees to his ankles. These cramps occur every now and then during the day but also especially when he lies down at night. This may last for up to 15 minutes. Getting up and walking around a bit will help relieve his cramps. He also notices some  swelling in his legs causing them to feel hard at times. For exercise he is walking 1/2 a block daily to his mailbox, as well as walking around his home and neighborhood. He denies any anginal symptoms and states his breathing is okay. Regarding his diet, he is usually cooking his own meals including steaks, hamburger, and chicken wings. Often he will bake his meals  instead of frying. He frequently has cereal. At home he monitors his blood sugars which have been stable. He denies any headaches, syncope, orthopnea, or PND.   Past Medical History:  Diagnosis Date   Aortic atherosclerosis (HCC)    Back pain    Barrett esophagus    Bleeding nose    CAD in native artery 07/30/2020   DDD (degenerative disc disease), thoracolumbar    Moderate to severe   Diabetes mellitus without complication (HCC)    Diverticulosis    Esophageal cancer (HCC) dx'd 2017   Essential hypertension 07/30/2020   GERD (gastroesophageal reflux disease)    GERD (gastroesophageal reflux disease) 08/21/2021   Headache    Hearing loss    Right ear   History of umbilical hernia repair    Idiopathic peripheral neuropathy    Iron deficiency anemia    LBBB (left bundle branch block) 02/11/2016   Left hip pain    Severe degenerative changes   Lipoma of neck    Right   Mixed hyperlipidemia    PONV (postoperative nausea and vomiting)    Reflux    Right thyroid nodule    Sciatica 07/30/2020    Past Surgical History:  Procedure Laterality Date   BALLOON DILATION N/A 05/06/2018   Procedure: BALLOON DILATION;  Surgeon: Jeani Hawking, MD;  Location: [facility];  Service: Endoscopy;  Laterality: N/A;   COLONOSCOPY     ESOPHAGOGASTRODUODENOSCOPY (EGD) WITH PROPOFOL N/A 05/06/2018   Procedure: ESOPHAGOGASTRODUODENOSCOPY (EGD) WITH PROPOFOL;  Surgeon: Jeani Hawking, MD;  Location: [facility];  Service: Endoscopy;  Laterality: N/A;   EUS N/A 12/05/2015   Procedure: UPPER ENDOSCOPIC ULTRASOUND (EUS) LINEAR;  Surgeon: Jeani Hawking, MD;  Location: [facility];  Service: Endoscopy;  Laterality: N/A;   EUS N/A 03/10/2016   Procedure: UPPER ENDOSCOPIC ULTRASOUND (EUS) LINEAR;  Surgeon: Jeani Hawking, MD;  Location: [facility];  Service: Endoscopy;  Laterality: N/A;   EUS N/A 01/07/2017   Procedure: UPPER ENDOSCOPIC ULTRASOUND (EUS) LINEAR;  Surgeon: Jeani Hawking, MD;  Location: [facility] ENDOSCOPY;   Service: Endoscopy;  Laterality: N/A;   HERNIA REPAIR     IR GENERIC HISTORICAL  12/24/2015   IR FLUORO GUIDE PORT INSERTION RIGHT 12/24/2015 Oley Balm, MD [facility]   IR GENERIC HISTORICAL  12/24/2015   IR US GUIDE VASC ACCESS RIGHT 12/24/2015 Oley Balm, MD [facility]   IR REMOVAL TUN ACCESS W/ PORT W/O FL MOD SED  02/07/2018   PORTA CATH INSERTION     RIGHT/LEFT HEART CATH AND CORONARY ANGIOGRAPHY N/A 01/15/2020   Procedure: RIGHT/LEFT HEART CATH AND CORONARY ANGIOGRAPHY;  Surgeon: Yvonne Kendall, MD;  Location: [facility];  Service: Cardiovascular;  Laterality: N/A;   SHOULDER ARTHROSCOPY W/ SUPERIOR LABRAL ANTERIOR POSTERIOR LESION REPAIR     times 2   TOTAL HIP ARTHROPLASTY Left 07/22/2017   Procedure: LEFT TOTAL HIP ARTHROPLASTY ANTERIOR APPROACH;  Surgeon: Samson Frederic, MD;  Location: [facility];  Service: Orthopedics;  Laterality: Left;   UPPER GI ENDOSCOPY  01/07/2017    Current Medications: Current Meds  Medication Sig   albuterol (VENTOLIN HFA) 108 (90  Base) MCG/ACT inhaler Inhale 2 puffs into the lungs every 6 (six) hours as needed for wheezing or shortness of breath.    amitriptyline (ELAVIL) 10 MG tablet Take 10 mg by mouth at bedtime.   aspirin EC 81 MG tablet Take 81 mg by mouth daily.   cetirizine (ZYRTEC ALLERGY) 10 MG tablet Take 1 tablet (10 mg total) by mouth daily. (Patient taking differently: Take 10 mg by mouth daily as needed for allergies or rhinitis.)   FARXIGA 5 MG TABS tablet Take by mouth.   fluticasone (FLONASE) 50 MCG/ACT nasal spray Place 1 spray into both nostrils daily. (Patient taking differently: Place 1 spray into both nostrils daily as needed for allergies or rhinitis.)   furosemide (LASIX) 20 MG tablet Take 1 tablet (20 mg total) by mouth daily.   gabapentin (NEURONTIN) 300 MG capsule Take 300 mg by mouth in the morning and at bedtime.    glimepiride (AMARYL) 1 MG tablet Take 1 mg by mouth every morning.   Multiple  Vitamins-Minerals (CENTRUM SILVER 50+MEN) TABS Take 1 tablet by mouth daily with breakfast.   nitroGLYCERIN (NITROSTAT) 0.4 MG SL tablet PLACE 1 TABLET (0.4 MG TOTAL) UNDER THE TONGUE EVERY FIVE MINUTES AS NEEDED FOR CHEST PAIN.   oxyCODONE-acetaminophen (PERCOCET) 10-325 MG tablet Take by mouth.   sacubitril-valsartan (ENTRESTO) 24-26 MG Take 1 tablet by mouth 2 (two) times daily.   spironolactone (ALDACTONE) 25 MG tablet Take 1 tablet (25 mg total) by mouth daily.   tiZANidine (ZANAFLEX) 2 MG tablet Take by mouth.   WIXELA INHUB 250-50 MCG/DOSE AEPB Inhale 1 puff into the lungs daily.   [DISCONTINUED] atorvastatin (LIPITOR) 40 MG tablet Take 1 tablet (40 mg total) by mouth daily.   [DISCONTINUED] carvedilol (COREG) 3.125 MG tablet TAKE 1 TABLET (3.125 MG TOTAL) BY MOUTH 2 (TWO) TIMES DAILY WITH A MEAL.   [DISCONTINUED] empagliflozin (JARDIANCE) 10 MG TABS tablet Take 1 tablet (10 mg total) by mouth daily.   [DISCONTINUED] pantoprazole (PROTONIX) 40 MG tablet Take 1 tablet (40 mg total) by mouth daily before breakfast.     Allergies:   Patient has no known allergies.   Social History   Socioeconomic History   Marital status: Legally Separated    Spouse name: Not on file   Number of children: Not on file   Years of education: Not on file   Highest education level: Not on file  Occupational History   Not on file  Tobacco Use   Smoking status: Former    Packs/day: 2.00    Years: 40.00    Pack years: 80.00    Types: Cigarettes    Quit date: 03/31/1999    Years since quitting: 22.4   Smokeless tobacco: Never  Vaping Use   Vaping Use: Never used  Substance and Sexual Activity   Alcohol use: Not Currently   Drug use: No   Sexual activity: Not on file  Other Topics Concern   Not on file  Social History Narrative   ** Merged History Encounter **       Married, wife [name] 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  ROS:   Please see the history of present illness.    (+) Chest discomfort (+) Acid reflux (+) Palpitations (+) Dizziness (+) Bilateral LE myalgias, muscle cramps (+) Bilateral LE edema All other systems reviewed and are negative.  EKGs/Labs/Other Studies Reviewed:    The following studies were reviewed today:  Echo  05/22/2021: Sonographer Comments: Suboptimal apical window and patient is morbidly obese. Image acquisition challenging due to COPD.  IMPRESSIONS    1. Left ventricular ejection fraction, by estimation, is 40 to 45%. The  left ventricle has mildly decreased function. The left ventricle  demonstrates global hypokinesis. There is moderate concentric left  ventricular hypertrophy. Left ventricular  diastolic parameters are consistent with Grade I diastolic dysfunction  (impaired relaxation).   2. Right ventricular systolic function is low normal. The right  ventricular size is mildly enlarged. Tricuspid regurgitation signal is  inadequate for assessing PA pressure.   3. Left atrial size was mildly dilated.   4. The mitral valve is normal in structure. Trivial mitral valve  regurgitation. No evidence of mitral stenosis.   5. The aortic valve is tricuspid. There is mild calcification of the  aortic valve. There is mild thickening of the aortic valve. Aortic valve  regurgitation is not visualized. Aortic valve sclerosis/calcification is  present, without any evidence of  aortic stenosis.   6. The inferior vena cava is normal  in size with greater than 50%  respiratory variability, suggesting right atrial pressure of 3 mmHg.   7. Cannot exclude a small PFO.   Comparison(s): Changes from prior study are noted. EF 25%,moderate LVH,  entire anterior septum and mid inferoseptal segment are akinetic. The  apical lateral segment, apical anterior segment, basal anterior segment,  apical inferior segment, and basal  inferoseptal segment are hypokinetic.   Conclusion(s)/Recommendation(s): No left ventricular mural or apical  thrombus/thrombi. Significant LV dyssynchrony due to LBBB. EF improved compared to prior but remains moderately reduced.   LE Doppler Study  05/22/2021: Summary:  Right: The right toe-brachial index is abnormal.  Although ankle brachial indices are within normal limits (0.95-1.29),  arterial Doppler waveforms at the ankle suggest some component of arterial occlusive disease.  Left: Resting left ankle-brachial index indicates mild left lower  extremity arterial disease. The left toe-brachial index is abnormal.   Bilateral LE Arterial Doppler  05/22/2021: Summary:  Right: 30-49% stenosis noted in the superficial femoral artery. Total  occlusion noted in the posterior tibial artery.   Left: 50-74% stenosis noted in the superficial femoral artery. 50-74%  stenosis noted in the posterior tibial artery.   CT Chest/Abdomen/Pelvis  01/13/2021: FINDINGS: CT CHEST FINDINGS   Cardiovascular: Normal heart size. No pericardial fluid or effusion. Thoracic aorta is nonaneurysmal. Scattered atherosclerotic calcification of the aorta and coronary arteries. Central pulmonary vasculature is within normal limits.   Mediastinum/Nodes: No axillary, mediastinal, or hilar lymphadenopathy. No thyroid nodule. Trachea is within normal limits. No focal esophageal wall thickening is evident.   Lungs/Pleura: Stable 5 mm nodule along the minor fissure within the right middle lobe is unchanged from 2019, benign. No  new pulmonary nodules. Mild centrilobular emphysema with diffuse bronchial wall thickening. Mild bibasilar subsegmental atelectasis. No airspace consolidation. No pleural effusion or pneumothorax.   Musculoskeletal: Acute mildly displaced fracture of the anterolateral right seventh rib (series 3, image  49). Minimally displaced fractures of the anterolateral right eighth, ninth, and tenth ribs. Thoracic vertebral body heights and alignment are maintained. Advanced degenerative changes of the bilateral sternoclavicular joints, left worse than right. No chest wall hematoma. Left-sided gynecomastia.   CT ABDOMEN PELVIS FINDINGS   Hepatobiliary: No hepatic injury or perihepatic hematoma. Focal liver lesion. Gallbladder is unremarkable.   Pancreas: Unremarkable. No pancreatic ductal dilatation or surrounding inflammatory changes.   Spleen: No splenic injury or perisplenic hematoma.   Adrenals/Urinary Tract: Unremarkable adrenal glands. No renal injury or perinephric hematoma. Unchanged small bilateral renal calculi. No hydronephrosis. Urinary bladder is within normal limits.   Stomach/Bowel: Stomach is within normal limits. Appendix appears normal. No evidence of bowel wall thickening, distention, or inflammatory changes.   Vascular/Lymphatic: Scattered atherosclerotic calcifications of the aortoiliac axis. No aneurysm. No abdominopelvic lymphadenopathy.   Reproductive: Prostate gland is largely obscured by artifact from patient's left hip prosthesis.   Other: No free fluid. No abdominopelvic fluid collection. No pneumoperitoneum. No abdominal wall hernia.   Musculoskeletal: Status post left total hip arthroplasty. No evidence of hardware complication. Acute mildly comminuted fracture involving the anterior aspect of the left iliac crest at the anterior superior iliac spine (series 3, image 91). No pelvic diastasis.   IMPRESSION: 1. Acute mildly comminuted fracture involving  the anterior aspect of the left iliac crest at the anterior superior iliac spine. 2. Acute mildly displaced fractures of the anterolateral right seventh, eighth, ninth, and tenth ribs. No pneumothorax. 3. Otherwise, no evidence of acute traumatic injury within the chest, abdomen, or pelvis. 4. Nonobstructing bilateral nephrolithiasis.   Aortic Atherosclerosis (ICD10-I70.0).  Right/Left Heart Cath  01/15/2020: Conclusions: Mild to moderate, non-obstructive coronary artery disease. Upper normal to mildly elevated left heart filling pressures. Normal right heart and pulmonary artery pressures. Moderately reduced Fick cardiac output/index.   Recommendations: Escalate evidence based heart failure therapy as tolerated for management of non-ischemic cardiomyopathy. Medical therapy and risk factor modification to prevent progression of coronary artery disease.  Diagnostic: Dominance: Right     EKG:   EKG is personally reviewed. 08/21/2021: EKG was not ordered. 07/30/2020: EKG was not ordered. 12/20/2015: Sinus rhythm. Rate 60 bpm. LBBB.  Recent Labs: 09/19/2020: B Natriuretic Peptide 100.3 05/13/2021: ALT 10; BUN 6; Creatinine, Ser 0.98; Hemoglobin 13.9; Platelets 283; Potassium 4.1; Sodium 144   Recent Lipid Panel    Component Value Date/Time   CHOL 174 05/13/2021 1130   TRIG 78 05/13/2021 1130   HDL 59 05/13/2021 1130   CHOLHDL 2.9 05/13/2021 1130   CHOLHDL 3.2 01/25/2020 0845   VLDL 13 01/25/2020 0845   LDLCALC 100 (H) 05/13/2021 1130    Physical Exam:    Wt Readings from Last 3 Encounters:  08/21/21 199 lb 12.8 oz (90.6 kg)  06/13/21 198 lb 12.8 oz (90.2 kg)  05/13/21 201 lb (91.2 kg)     VS:  BP 118/78 (BP Location: Left Arm, Patient Position: Sitting, Cuff Size: Normal)   Pulse 60   Ht 5\' 9"  (1.753 m)   Wt 199 lb 12.8 oz (90.6 kg)   SpO2 97%   BMI 29.51 kg/m  , BMI Body mass index is 29.51 kg/m. GENERAL:  Well appearing HEENT: Pupils equal round and reactive,  fundi not visualized, oral mucosa unremarkable NECK:  No jugular venous distention, waveform within normal limits, carotid upstroke brisk and symmetric, no bruits, no thyromegaly LUNGS:  Clear to auscultation bilaterally HEART:  RRR.  PMI not displaced or sustained,S1 and S2 within normal limits,  no S3, no S4, no clicks, no rubs, no murmurs ABD:  Flat, positive bowel sounds normal in frequency in pitch, no bruits, no rebound, no guarding, no midline pulsatile mass, no hepatomegaly, no splenomegaly EXT:  2 plus pulses throughout, no edema, no cyanosis no clubbing SKIN:  No rashes no nodules NEURO:  Cranial nerves II through XII grossly intact, motor grossly intact throughout PSYCH:  Cognitively intact, oriented to person place and time   ASSESSMENT:    1. Leg cramps   2. Aortic atherosclerosis (HCC)   3. Chronic combined systolic and diastolic heart failure (HCC)   4. Gastroesophageal reflux disease, unspecified whether esophagitis present   5. CAD in native artery   6. Therapeutic drug monitoring   7. Hyperlipidemia with target LDL less than 70   8. Palpitations    PLAN:    Aortic atherosclerosis (HCC) Noted on imaging.  LDL goal is less than 70.  Check fasting lipids and a CMP today.  Continue aspirin and atorvastatin.  Chronic combined systolic and diastolic heart failure (HCC) LVEF 40 to 45% with grade 1 diastolic dysfunction on echo 04/2021.  This was improved from 20 to 25% when he was first diagnosed with heart failure in 2021.  Cath then revealed nonobstructive CAD.  He is euvolemic and doing well.  Continue carvedilol, Marcelline Deist, Entresto, and spironolactone.  He also takes Lasix 20 mg daily.  Check a CMP and magnesium today as he also reports leg cramps.  GERD (gastroesophageal reflux disease) He reports symptoms of acid reflux that are worsened with coffee.  He does not have exertional chest pain.  No plans for repeat ischemic evaluation at this time.  CAD in native  artery Moderate non-obstructive CAD on cath in 2021.  He has no ischemic symptoms.  Continue aspirin, carvedilol, and atorvastatin.  LDL goal is less than 70.  He was restarted on atorvastatin in February.  He notes that his leg cramps were ongoing since prior to that appointment and if anything are less now than before.  I do not think this is due to myalgias.  Therefore we will continue the atorvastatin.  Chronic combined systolic and diastolic heart failure [facility]) Mr. Chiquita Loth is doing well clinically and is euvolemic on exam.  Continue carvedilol, Entresto, Jardiance, and spironolactone.  We will continue tracking his weight daily.  He has been on guideline directed medical therapy for more than 3 months.  We will repeat his echo and if his LVEF remains less than 35% then he will need to be considered for ICD.   Essential hypertension He brings a log of his blood pressure showing that it has been elevated at home.  We discussed proper technique.  In the office today it is quite well-controlled.  Continue carvedilol, Entresto, and spironolactone.   LBBB (left bundle branch block) Chronic.   CAD in native artery Mild to moderate CAD on cath.  He has chest pain that is not ischemic.  Continue medical management with aspirin and atorvastatin as well as carvedilol.  LDL was not at goal when checked in January.  We will repeat in 3 months and if his lipids remain above goal we will need to add another agent.   Sciatica He has bilateral leg pain that is radicular in nature.  He has good peripheral pulses.  Recommend that he continue his gabapentin and talk to his primary care provider about this.       Disposition: FU with APP in 4 months. FU with Ellard Nan  Sherryl Manges, MD, [facility] in 1 year.  Medication Adjustments/Labs and Tests Ordered: Current medicines are reviewed at length with the patient today.  Concerns regarding medicines are outlined above.   Orders Placed This Encounter  Procedures    Lipid panel   Magnesium   Comprehensive metabolic panel   LONG TERM MONITOR (3-14 DAYS)   Meds ordered this encounter  Medications   carvedilol (COREG) 3.125 MG tablet    Sig: Take 1 tablet (3.125 mg total) by mouth 2 (two) times daily with a meal.    Dispense:  180 tablet    Refill:  2   atorvastatin (LIPITOR) 40 MG tablet    Sig: Take 1 tablet (40 mg total) by mouth daily.    Dispense:  90 tablet    Refill:  3   pantoprazole (PROTONIX) 40 MG tablet    Sig: Take 1 tablet (40 mg total) by mouth daily before breakfast.    Dispense:  90 tablet    Refill:  1   Patient Instructions  Medication Instructions:  Your physician recommends that you continue on your current medications as directed. Please refer to the Current Medication list given to you today.  *If you need a refill on your cardiac medications before your next appointment, please call your pharmacy*  Lab Work: LP/CMET/MAGNESIUM TODAY   If you have labs (blood work) drawn today and your tests are completely normal, you will receive your results only by: MyChart Message (if you have MyChart) OR A paper copy in the mail If you have any lab test that is abnormal or we need to change your treatment, we will call you to review the results.   Testing/Procedures: NONE  Follow-Up: At [facility], you and your health needs are our priority.  As part of our continuing mission to provide you with exceptional heart care, we have created designated Provider Care Teams.  These Care Teams include your primary Cardiologist (physician) and Advanced Practice Providers (APPs -  Physician Assistants and Nurse Practitioners) who all work together to provide you with the care you need, when you need it.  We recommend signing up for the patient portal called "MyChart".  Sign up information is provided on this After Visit Summary.  MyChart is used to connect with patients for Virtual Visits (Telemedicine).  Patients are able to view lab/test  results, encounter notes, upcoming appointments, etc.  Non-urgent messages can be sent to your provider as well.   To learn more about what you can do with MyChart, go to [email or web address].    Your next appointment:   4 month(s)  The format for your next appointment:   In Person  Provider:   Gillian Shields, NP   1 YEAR DR [facility]   Christena Deem- Long Term Monitor Instructions  Your physician has requested you wear a ZIO patch monitor for 7 days.  This is a single patch monitor. [employer] supplies one patch monitor per enrollment. Additional stickers are not available. Please do not apply patch if you will be having a Nuclear Stress Test,  Echocardiogram, Cardiac CT, MRI, or Chest Xray during the period you would be wearing the  monitor. The patch cannot be worn during these tests. You cannot remove and re-apply the  ZIO XT patch monitor.  Your ZIO patch monitor will be mailed 3 day USPS to your address on file. It may take 3-5 days  to receive your monitor after you have been enrolled.  Once you have received your monitor,  please review the enclosed instructions. Your monitor  has already been registered assigning a specific monitor serial # to you.  Billing and Patient Assistance Program Information  We have supplied [employer] with any of your insurance information on file for billing purposes. [employer] offers a sliding scale Patient Assistance Program for patients that do not have  insurance, or whose insurance does not completely cover the cost of the ZIO monitor.  You must apply for the Patient Assistance Program to qualify for this discounted rate.  To apply, please call [employer] at [phone], select option 4, select option 2, ask to apply for  Patient Assistance Program. Meredeth Ide will ask your household income, and how many people  are in your household. They will quote your out-of-pocket cost based on that information.  [employer] will also be able to set up a 64-month,  interest-free payment plan if needed.  Applying the monitor   Shave hair from upper left chest.  Hold abrader disc by orange tab. Rub abrader in 40 strokes over the upper left chest as  indicated in your monitor instructions.  Clean area with 4 enclosed alcohol pads. Let dry.  Apply patch as indicated in monitor instructions. Patch will be placed under collarbone on left  side of chest with arrow pointing upward.  Rub patch adhesive wings for 2 minutes. Remove white label marked "1". Remove the white  label marked "2". Rub patch adhesive wings for 2 additional minutes.  While looking in a mirror, press and release button in center of patch. A small green light will  flash 3-4 times. This will be your only indicator that the monitor has been turned on.  Do not shower for the first 24 hours. You may shower after the first 24 hours.  Press the button if you feel a symptom. You will hear a small click. Record Date, Time and  Symptom in the Patient Logbook.  When you are ready to remove the patch, follow instructions on the last 2 pages of Patient  Logbook. Stick patch monitor onto the last page of Patient Logbook.  Place Patient Logbook in the blue and white box. Use locking tab on box and tape box closed  securely. The blue and white box has prepaid postage on it. Please place it in the mailbox as  soon as possible. Your physician should have your test results approximately 7 days after the  monitor has been mailed back to [facility].  Call [facility] Customer Care at [phone] if you have questions regarding  your ZIO XT patch monitor. Call them immediately if you see an orange light blinking on your  monitor.  If your monitor falls off in less than 4 days, contact our Monitor department at [phone].  If your monitor becomes loose or falls off after 4 days call [employer] at [phone] for  suggestions on securing your monitor       I,Mathew Stumpf,acting as a scribe  for Chilton Si, MD.,have documented all relevant documentation on the behalf of Chilton Si, MD,as directed by  Chilton Si, MD while in the presence of Chilton Si, MD.  I, Delshawn Stech C. Duke Salvia, MD have reviewed all documentation for this visit.  The documentation of the exam, diagnosis, procedures, and orders on 08/21/2021 are all accurate and complete.   Signed, Chilton Si, MD  08/21/2021 12:04 PM    [facility]

## 2021-08-22 LAB — COMPREHENSIVE METABOLIC PANEL
ALT: 20 IU/L (ref 0–44)
AST: 24 IU/L (ref 0–40)
Albumin/Globulin Ratio: 1.6 (ref 1.2–2.2)
Albumin: 4.5 g/dL (ref 3.6–4.6)
Alkaline Phosphatase: 100 IU/L (ref 44–121)
BUN/Creatinine Ratio: 7 — ABNORMAL LOW (ref 10–24)
BUN: 7 mg/dL — ABNORMAL LOW (ref 8–27)
Bilirubin Total: 0.3 mg/dL (ref 0.0–1.2)
CO2: 19 mmol/L — ABNORMAL LOW (ref 20–29)
Calcium: 8.7 mg/dL (ref 8.6–10.2)
Chloride: 100 mmol/L (ref 96–106)
Creatinine, Ser: 0.96 mg/dL (ref 0.76–1.27)
Globulin, Total: 2.9 g/dL (ref 1.5–4.5)
Glucose: 121 mg/dL — ABNORMAL HIGH (ref 70–99)
Potassium: 4.8 mmol/L (ref 3.5–5.2)
Sodium: 133 mmol/L — ABNORMAL LOW (ref 134–144)
Total Protein: 7.4 g/dL (ref 6.0–8.5)
eGFR: 79 mL/min/{1.73_m2} (ref >59)

## 2021-08-22 LAB — LIPID PANEL
Chol/HDL Ratio: 4.4 ratio (ref 0.0–5.0)
Cholesterol, Total: 237 mg/dL — ABNORMAL HIGH (ref 100–199)
HDL: 54 mg/dL (ref >39)
LDL Chol Calc (NIH): 162 mg/dL — ABNORMAL HIGH (ref 0–99)
Triglycerides: 118 mg/dL (ref 0–149)
VLDL Cholesterol Cal: 21 mg/dL (ref 5–40)

## 2021-08-22 LAB — MAGNESIUM: Magnesium: 1.8 mg/dL (ref 1.6–2.3)

## 2021-08-26 DIAGNOSIS — R002 Palpitations: Secondary | ICD-10-CM | POA: Diagnosis not present

## 2021-09-03 ENCOUNTER — Other Ambulatory Visit: Payer: Self-pay

## 2021-09-03 ENCOUNTER — Telehealth: Payer: Self-pay

## 2021-09-03 NOTE — Telephone Encounter (Signed)
Pt called requesting an appointment with Dr. Burr Medico.  Pt stated he's stating to have pain in the back of his neck again and when he swallows.  Pt stated he was seen by Dr. Burr Medico for Esophageal Cancer.  Sent a scheduling message to have pt scheduled with Dr. Burr Medico for a f/u appt.

## 2021-09-08 ENCOUNTER — Telehealth: Payer: Self-pay

## 2021-09-08 NOTE — Telephone Encounter (Signed)
This nurse spoke with patient about moving his scheduled appointment from 6/20 to Tuesday 6/13 at 12:30 pm.  Patient is in agreement.  No further questions or concerns at [facility] time.

## 2021-09-09 ENCOUNTER — Inpatient Hospital Stay: Payer: Medicare HMO | Attending: Nurse Practitioner

## 2021-09-09 ENCOUNTER — Inpatient Hospital Stay (HOSPITAL_BASED_OUTPATIENT_CLINIC_OR_DEPARTMENT_OTHER): Payer: Medicare HMO | Admitting: Nurse Practitioner

## 2021-09-09 ENCOUNTER — Other Ambulatory Visit: Payer: Self-pay

## 2021-09-09 ENCOUNTER — Encounter: Payer: Self-pay | Admitting: Nurse Practitioner

## 2021-09-09 VITALS — BP 125/77 | HR 85 | Temp 98.1°F | Resp 18 | Ht 69.0 in | Wt 198.8 lb

## 2021-09-09 DIAGNOSIS — Z8501 Personal history of malignant neoplasm of esophagus: Secondary | ICD-10-CM | POA: Diagnosis present

## 2021-09-09 DIAGNOSIS — R6881 Early satiety: Secondary | ICD-10-CM | POA: Diagnosis not present

## 2021-09-09 DIAGNOSIS — Z08 Encounter for follow-up examination after completed treatment for malignant neoplasm: Secondary | ICD-10-CM | POA: Insufficient documentation

## 2021-09-09 DIAGNOSIS — C154 Malignant neoplasm of middle third of esophagus: Secondary | ICD-10-CM

## 2021-09-09 LAB — CBC WITH DIFFERENTIAL/PLATELET
Abs Immature Granulocytes: 0 10*3/uL (ref 0.00–0.07)
Basophils Absolute: 0 10*3/uL (ref 0.0–0.1)
Basophils Relative: 1 %
Eosinophils Absolute: 0.1 10*3/uL (ref 0.0–0.5)
Eosinophils Relative: 3 %
HCT: 40.4 % (ref 39.0–52.0)
Hemoglobin: 12.8 g/dL — ABNORMAL LOW (ref 13.0–17.0)
Immature Granulocytes: 0 %
Lymphocytes Relative: 33 %
Lymphs Abs: 1.4 10*3/uL (ref 0.7–4.0)
MCH: 23.7 pg — ABNORMAL LOW (ref 26.0–34.0)
MCHC: 31.7 g/dL (ref 30.0–36.0)
MCV: 74.8 fL — ABNORMAL LOW (ref 80.0–100.0)
Monocytes Absolute: 0.5 10*3/uL (ref 0.1–1.0)
Monocytes Relative: 11 %
Neutro Abs: 2.2 10*3/uL (ref 1.7–7.7)
Neutrophils Relative %: 52 %
Platelets: 275 10*3/uL (ref 150–400)
RBC: 5.4 MIL/uL (ref 4.22–5.81)
RDW: 15.5 % (ref 11.5–15.5)
WBC: 4.3 10*3/uL (ref 4.0–10.5)
nRBC: 0 % (ref 0.0–0.2)

## 2021-09-09 LAB — COMPREHENSIVE METABOLIC PANEL
ALT: 12 U/L (ref 0–44)
AST: 18 U/L (ref 15–41)
Albumin: 4.3 g/dL (ref 3.5–5.0)
Alkaline Phosphatase: 70 U/L (ref 38–126)
Anion gap: 5 (ref 5–15)
BUN: 11 mg/dL (ref 8–23)
CO2: 28 mmol/L (ref 22–32)
Calcium: 9.8 mg/dL (ref 8.9–10.3)
Chloride: 104 mmol/L (ref 98–111)
Creatinine, Ser: 1.16 mg/dL (ref 0.61–1.24)
GFR, Estimated: >60 mL/min (ref >60)
Glucose, Bld: 110 mg/dL — ABNORMAL HIGH (ref 70–99)
Potassium: 4.4 mmol/L (ref 3.5–5.1)
Sodium: 137 mmol/L (ref 135–145)
Total Bilirubin: 0.6 mg/dL (ref 0.3–1.2)
Total Protein: 7.7 g/dL (ref 6.5–8.1)

## 2021-09-09 NOTE — Progress Notes (Signed)
[place]   Telephone:([phone] Fax:(336) [phone]   Clinic Follow up Note   Patient Care Team: [place], [name]), MD (Inactive) as PCP - General (Internal Medicine) Skeet Latch, MD as PCP - Cardiology (Cardiology) [name], MD as Consulting Physician (Hematology) Kyung Rudd, MD as Consulting Physician (Radiation Oncology) Juanita Craver, MD as Consulting Physician (Gastroenterology) 09/09/2021  CHIEF COMPLAINT: Symptom management visit for left chest pain and upper back pain in the setting of middle esophagus cancer in 2017  SUMMARY OF ONCOLOGIC HISTORY: Oncology History Overview Note  Presented with dysphagia and odynophagia with 20-25 pounds of weight loss in about 3-4 months  Esophageal cancer ([place])   Staging form: Esophagus - Adenocarcinoma, AJCC 7th Edition   - Clinical stage from 11/13/2015: Stage IIIB (T3, N2, M0, G2) - Signed by [name], MD on 12/11/2015    Esophageal cancer ([place])  11/13/2015 Procedure   UPPER ENDOSCOPY per Dr. Collene Mares: Large fungating, friable bleeding mass in middle third of esophagus, 22cm from incisors and extended to 27cm. Non-obstructing.   11/13/2015 Pathology Results   Invasive squamous cell carcinoma; moderately differentiated   11/13/2015 Imaging   CT ABD/PELVIS: IMPRESSION:No evidence of metastatic disease in the abdomen or pelvis. 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 Aortoiliac atherosclerosis. Small bilateral inguinal hernias containing fat the   11/22/2015 Initial Diagnosis   Esophageal cancer ([place])   12/03/2015 Imaging   PET scan showed a long segment of hypermetabolic thickening in the mid esophagus consistent with esophageal carcinoma. No clear evidence of node metastasis or distant metastasis.   12/10/2015 - 01/14/2016 Radiation Therapy   Neoadjuvant radiation to his esophageal cancer   12/10/2015 - 01/15/2016 Chemotherapy   Neoadjuvant weekly carboplatin AUC 2, and Taxol 45 mg/m, with concurrent  radiation. He developed steroid-induced psychosis, and Taxol was changed to Abraxane to avoid premedication with steroids.   02/24/2016 Imaging   CT CAP with contrast showed interval improvement mid esophageal mass, no evidence for metastatic adenopathy or distant metastasis.    03/10/2016 Procedure   Repeat EGD by Dr. Benson Norway showed wall thickening in the thoracic esophagus with the tumor was. The thickness decreased from 12.8 mm to 6-7 mm. Not able to differentiate between actual tumor versus fibrosis from radiation. Some peritumoral shoddy lymph nodes.   04/07/2016 Surgery   Patient followed up with Dr. Servando Snare, patient is not a great candidate for surgery, pt also does not want surgery, mutually agreed to not pursue esophagectomy.    06/01/2016 Imaging   CT C/A/P IMPRESSION: Mild mid esophageal wall thickening without residual mass on CT. Radiation changes in the paramediastinal lung bilaterally. No evidence of recurrent or metastatic disease.   08/18/2016 -  Hospital Admission   Patient presented to the ER with SOB and complaints of chest pain   11/07/2016 Imaging   Nm Pet Image Restag IMPRESSION: Negative PET-CT. No findings for recurrent esophageal cancer or metastatic disease.   01/07/2017 Procedure   EUS by Dr. Benson Norway 01/07/17 IMPRESSION - Normal esophagus. - Normal stomach. - Normal examined duodenum. - Wall thickening was seen in the upper third of the esophagus and in the middle third of the esophagus. - One benign lymph node was visualized in the middle paraesophageal mediastinum (level 45M). - No specimens collected.   05/10/2017 Imaging   CT CAP W Contrast 05/10/17 IMPRESSION: 1. No new or progressive findings to suggest recurrent or metastatic disease in the chest or abdomen on today's study. 2.  Aortic Atherosclerois (ICD10-170.0)  01/14/2018 Imaging   01/14/2018 CT Neck IMPRESSION: 1. No evidence of cervical lymphadenopathy. 2. 5 cm right neck lipoma, mildly  enlarged from 2010.   01/14/2018 Imaging   01/14/2018 CT CAP IMPRESSION: 1. Interval development of several tiny bilateral pulmonary nodules. While indeterminate and potentially related to infectious/inflammatory etiology, close attention recommended as early metastatic disease not excluded. 2. Otherwise stable exam. 3.  Emphysema. (ICD10-J43.9) 4.  Aortic Atherosclerois (ICD10-170.0)   01/25/2019 Imaging   CT AP W contrast  IMPRESSION: No evidence for residual or recurrent tumor within the abdomen or pelvis.     05/07/2019 Procedure   Upper Endoscopy by Dr Benson Norway  IMPRESSION - Benign-appearing esophageal stenosis. Dilated. - Normal stomach. - Normal examined duodenum. - No specimens collected.   01/13/2020 Imaging   CT CAP w Contrast  IMPRESSION: 1. Mild interlobular septal thickening and trace bilateral pleural effusions, consistent with pulmonary edema. 2. Mild centrilobular emphysema. Emphysema (ICD10-J43.9). 3. Diffuse bilateral bronchial wall thickening, consistent with nonspecific infectious or inflammatory bronchitis. 4. No evidence of esophageal mass or other specific evidence of malignancy on this examination. 5. Prominent mediastinal lymph nodes, unchanged compared to prior examination, nonspecific. 6. Coronary artery disease. Aortic Atherosclerosis (ICD10-I70.0). 7. Nonobstructive bilateral nephrolithiasis.       CURRENT THERAPY: Esophagus cancer surveillance  INTERVAL HISTORY: Mr. Berkovich returns for symptom management visit.  Previously treated for middle esophagus cancer with concurrent chemoradiation with curative intent, he has been on surveillance since then.  2 months ago he developed upper middle/left chest pain and upper back pain similar to initial presentation with esophagus cancer.  The chest pain occurs with swallowing.  He is eating a normal regular diet and can swallow without pain or difficulty but feels food going down.  He has early satiety.   He has had chronic reflux that seems worse lately, more burping and burning.  He is planning to restart Protonix.  He had some constipation recently that resolved.  Denies bleeding.  He retired in October, has felt more weak and tired lately with near falls.  He states he has had a right neck mass for a long time and feels that his other chronic conditions are well managed.  All other systems were reviewed with the patient and are negative.  MEDICAL HISTORY:  Past Medical History:  Diagnosis Date   Aortic atherosclerosis (HCC)    Back pain    Barrett esophagus    Bleeding nose    CAD in native artery 07/30/2020   DDD (degenerative disc disease), thoracolumbar    Moderate to severe   Diabetes mellitus without complication ([place])    Diverticulosis    Esophageal cancer ([place]) dx'd 2017   Essential hypertension 07/30/2020   GERD (gastroesophageal reflux disease)    GERD (gastroesophageal reflux disease) 08/21/2021   Headache    Hearing loss    Right ear   History of umbilical hernia repair    Idiopathic peripheral neuropathy    Iron deficiency anemia    LBBB (left bundle branch block) 02/11/2016   Left hip pain    Severe degenerative changes   Lipoma of neck    Right   Mixed hyperlipidemia    PONV (postoperative nausea and vomiting)    Reflux    Right thyroid nodule    Sciatica 07/30/2020    SURGICAL HISTORY: Past Surgical History:  Procedure Laterality Date   BALLOON DILATION N/A 05/06/2018   Procedure: BALLOON DILATION;  Surgeon: Carol Ada, MD;  Location: [facility];  Service:  Endoscopy;  Laterality: N/A;   COLONOSCOPY     ESOPHAGOGASTRODUODENOSCOPY (EGD) WITH PROPOFOL N/A 05/06/2018   Procedure: ESOPHAGOGASTRODUODENOSCOPY (EGD) WITH PROPOFOL;  Surgeon: Carol Ada, MD;  Location: [facility];  Service: Endoscopy;  Laterality: N/A;   EUS N/A 12/05/2015   Procedure: UPPER ENDOSCOPIC ULTRASOUND (EUS) LINEAR;  Surgeon: Carol Ada, MD;  Location: [facility];  Service:  Endoscopy;  Laterality: N/A;   EUS N/A 03/10/2016   Procedure: UPPER ENDOSCOPIC ULTRASOUND (EUS) LINEAR;  Surgeon: Carol Ada, MD;  Location: [facility];  Service: Endoscopy;  Laterality: N/A;   EUS N/A 01/07/2017   Procedure: UPPER ENDOSCOPIC ULTRASOUND (EUS) LINEAR;  Surgeon: Carol Ada, MD;  Location: [facility];  Service: Endoscopy;  Laterality: N/A;   HERNIA REPAIR     IR GENERIC HISTORICAL  12/24/2015   IR FLUORO GUIDE PORT INSERTION RIGHT 12/24/2015 Arne [place], MD [facility]   IR GENERIC HISTORICAL  12/24/2015   IR US GUIDE VASC ACCESS RIGHT 12/24/2015 Arne [place], MD [facility]   IR REMOVAL TUN ACCESS W/ PORT W/O FL MOD SED  02/07/2018   PORTA CATH INSERTION     RIGHT/LEFT HEART CATH AND CORONARY ANGIOGRAPHY N/A 01/15/2020   Procedure: RIGHT/LEFT HEART CATH AND CORONARY ANGIOGRAPHY;  Surgeon: Nelva Bush, MD;  Location: [facility];  Service: Cardiovascular;  Laterality: N/A;   SHOULDER ARTHROSCOPY W/ SUPERIOR LABRAL ANTERIOR POSTERIOR LESION REPAIR     times 2   TOTAL HIP ARTHROPLASTY Left 07/22/2017   Procedure: LEFT TOTAL HIP ARTHROPLASTY ANTERIOR APPROACH;  Surgeon: Rod Can, MD;  Location: [facility];  Service: Orthopedics;  Laterality: Left;   UPPER GI ENDOSCOPY  01/07/2017    I have reviewed the social history and family history with the patient and they are unchanged from previous note.  ALLERGIES:  has No Known Allergies.  MEDICATIONS:  Current Outpatient Medications  Medication Sig Dispense Refill   albuterol (VENTOLIN HFA) 108 (90 Base) MCG/ACT inhaler Inhale 2 puffs into the lungs every 6 (six) hours as needed for wheezing or shortness of breath.      amitriptyline (ELAVIL) 10 MG tablet Take 10 mg by mouth at bedtime.     aspirin EC 81 MG tablet Take 81 mg by mouth daily.     atorvastatin (LIPITOR) 40 MG tablet Take 1 tablet (40 mg total) by mouth daily. 90 tablet 3   carvedilol (COREG) 3.125 MG tablet Take 1 tablet (3.125 mg total)  by mouth 2 (two) times daily with a meal. 180 tablet 2   cetirizine (ZYRTEC ALLERGY) 10 MG tablet Take 1 tablet (10 mg total) by mouth daily. (Patient taking differently: Take 10 mg by mouth daily as needed for allergies or rhinitis.) 30 tablet 0   FARXIGA 5 MG TABS tablet Take by mouth.     fluticasone (FLONASE) 50 MCG/ACT nasal spray Place 1 spray into both nostrils daily. (Patient taking differently: Place 1 spray into both nostrils daily as needed for allergies or rhinitis.) 16 g 0   furosemide (LASIX) 20 MG tablet Take 1 tablet (20 mg total) by mouth daily. 30 tablet 0   gabapentin (NEURONTIN) 300 MG capsule Take 300 mg by mouth in the morning and at bedtime.      glimepiride (AMARYL) 1 MG tablet Take 1 mg by mouth every morning.     Multiple Vitamins-Minerals (CENTRUM SILVER 50+MEN) TABS Take 1 tablet by mouth daily with breakfast.     nitroGLYCERIN (NITROSTAT) 0.4 MG SL tablet PLACE 1 TABLET (0.4 MG TOTAL)  UNDER THE TONGUE EVERY FIVE MINUTES AS NEEDED FOR CHEST PAIN. 25 tablet 12   oxyCODONE-acetaminophen (PERCOCET) 10-325 MG tablet Take by mouth.     pantoprazole (PROTONIX) 40 MG tablet Take 1 tablet (40 mg total) by mouth daily before breakfast. 90 tablet 1   sacubitril-valsartan (ENTRESTO) 24-26 MG Take 1 tablet by mouth 2 (two) times daily. 60 tablet 1   spironolactone (ALDACTONE) 25 MG tablet Take 1 tablet (25 mg total) by mouth daily. 90 tablet 3   tiZANidine (ZANAFLEX) 2 MG tablet Take by mouth.     WIXELA INHUB 250-50 MCG/DOSE AEPB Inhale 1 puff into the lungs daily.     No current facility-administered medications for this visit.    PHYSICAL EXAMINATION: ECOG PERFORMANCE STATUS: 1 - Symptomatic but completely ambulatory  Vitals:   09/09/21 1226  BP: 125/77  Pulse: 85  Resp: 18  Temp: 98.1 F (36.7 C)  SpO2: 97%   Filed Weights   09/09/21 1226  Weight: 198 lb 12.8 oz (90.2 kg)    GENERAL:alert, no distress and comfortable SKIN: No rash EYES: sclera clear NECK:  Palpable right anterior cervical mass  LYMPH:  no palpable supraclavicular lymphadenopathy  LUNGS: Decreased but clear with normal breathing effort HEART: regular rate & rhythm, no lower extremity edema ABDOMEN:abdomen soft, non-tender and normal bowel sounds Musculoskeletal: No palpable sternal or spinal tenderness NEURO: alert & oriented x 3 with fluent speech, no focal motor/sensory deficits  LABORATORY DATA:  I have reviewed the data as listed    Latest Ref Rng & Units 09/09/2021   12:07 PM 05/13/2021   11:30 AM 01/13/2021   11:25 AM  CBC  WBC 4.0 - 10.5 K/uL 4.3  5.5  10.3   Hemoglobin 13.0 - 17.0 g/dL 12.8  13.9  12.6   Hematocrit 39.0 - 52.0 % 40.4  45.4  41.3   Platelets 150 - 400 K/uL 275  283  279         Latest Ref Rng & Units 09/09/2021   12:07 PM 08/21/2021   10:01 AM 05/13/2021   11:30 AM  CMP  Glucose 70 - 99 mg/dL 110  121  127   BUN 8 - 23 mg/dL '11  7  6   '$ Creatinine 0.61 - 1.24 mg/dL 1.16  0.96  0.98   Sodium 135 - 145 mmol/L 137  133  144   Potassium 3.5 - 5.1 mmol/L 4.4  4.8  4.1   Chloride 98 - 111 mmol/L 104  100  104   CO2 22 - 32 mmol/L '28  19  21   '$ Calcium 8.9 - 10.3 mg/dL 9.8  8.7  9.3   Total Protein 6.5 - 8.1 g/dL 7.7  7.4  7.6   Total Bilirubin 0.3 - 1.2 mg/dL 0.6  0.3  0.7   Alkaline Phos 38 - 126 U/L 70  100  100   AST 15 - 41 U/L '18  24  17   '$ ALT 0 - 44 U/L '12  20  10       '$ RADIOGRAPHIC STUDIES: I have personally reviewed the radiological images as listed and agreed with the findings in the report. No results found.   ASSESSMENT & PLAN: 81 y.o. male   Symptom management: Chest and back pain, worsening GERD, early satiety, weakness and fatigue -Onset 2 months ago, chest and back pain are similar to initial presentation with esophagus cancer from 2017 -Tolerating normal diet without significant dysphagia or odynophagia, no weight loss which  is reassuring -Restart Protonix for GERD -Mr. Senseney is being referred for CT CAP to rule out  recurrence, phone follow-up few days after for next steps  2. 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  Staging work-up was negative for distant metastasis  -He was treated with concurrent chemo RT with weekly carbo and Taxol.  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 Servando Snare mutually agreed not to pursue esophagectomy due to his advanced age and general health condition -Posttreatment EGD and subsequent scans showed no evidence of disease -Last seen by [place] 12/2019, he completed 5 years of surveillance in 2022  3.  Intermittent anemia  -Mild, stable.  No anemia 04/2021 -He has had recurrent anemia today, Hgb 12.8, MCV 74; no other cytopenias -Denies bleeding    Plan: -CT CAP in 1-2 weeks to r/o recurrence, follow-up after to review and next steps -Patient given contrast today   Orders Placed This Encounter  Procedures   CT CHEST ABDOMEN PELVIS W CONTRAST    Standing Status:   Future    Standing Expiration Date:   09/10/2022    Order Specific Question:   Preferred imaging location?    Answer:   [facility]    Order Specific Question:   Is Oral Contrast requested for this exam?    Answer:   Yes, Per Radiology protocol   All questions were answered. The patient knows to call the clinic with any problems, questions or concerns. No barriers to learning was detected. I spent 20 minutes counseling the patient face to face. The total time spent in the appointment was 30 minutes and more than 50% was on counseling and review of test results     Alla Feeling, NP 09/09/21

## 2021-09-10 ENCOUNTER — Encounter: Payer: Self-pay | Admitting: Hematology

## 2021-09-10 ENCOUNTER — Telehealth: Payer: Self-pay | Admitting: Nurse Practitioner

## 2021-09-10 ENCOUNTER — Telehealth (HOSPITAL_BASED_OUTPATIENT_CLINIC_OR_DEPARTMENT_OTHER): Payer: Self-pay | Admitting: Cardiovascular Disease

## 2021-09-10 NOTE — Telephone Encounter (Signed)
Tried to call patient, voicemail full   Called [employer] and advised will let them know after speaking with patient

## 2021-09-10 NOTE — Telephone Encounter (Signed)
Spoke with patient and he stated he did put device in box because he remembers it was still blinking  Called and spoke to  [place] and they had actually spoken with patient. They will be sending patient another monitor

## 2021-09-10 NOTE — Telephone Encounter (Signed)
[employer] is calling stating they received an empty box for this patient's heart monitor. They are wanting to know if a new device is needing to be sent due to this. Please advise.

## 2021-09-10 NOTE — Telephone Encounter (Signed)
Pt returning call. Transferred to Alvina Filbert, RN.

## 2021-09-10 NOTE — Telephone Encounter (Signed)
Unable to leave message with follow-up appointment per 6/13 los. Mailed calendar.

## 2021-09-14 ENCOUNTER — Encounter (HOSPITAL_COMMUNITY): Payer: Self-pay

## 2021-09-14 ENCOUNTER — Emergency Department (HOSPITAL_COMMUNITY)
Admission: EM | Admit: 2021-09-14 | Discharge: 2021-09-14 | Discharge status: Against Medical Advice | Payer: Medicare HMO | Attending: Emergency Medicine | Admitting: Emergency Medicine

## 2021-09-14 ENCOUNTER — Other Ambulatory Visit: Payer: Self-pay

## 2021-09-14 DIAGNOSIS — K5900 Constipation, unspecified: Secondary | ICD-10-CM | POA: Insufficient documentation

## 2021-09-14 DIAGNOSIS — Z5321 Procedure and treatment not carried out due to patient leaving prior to being seen by health care provider: Secondary | ICD-10-CM | POA: Insufficient documentation

## 2021-09-14 NOTE — ED Triage Notes (Addendum)
Patient BIB [facility] from home. Been constipated for 4 days. Tried OTC stuff at home, it worked, but he ran out. Can hear bowel sounds in all 4 quadrants. Denies nausea, vomiting. CBG 156. Taking oxycodone for arthritis.

## 2021-09-14 NOTE — ED Notes (Signed)
After triaging patient, he stated he needed to have a bowel movement. Patient wheeled to the bathroom, patient indeed had a bowel movement and said he was leaving.

## 2021-09-16 ENCOUNTER — Other Ambulatory Visit: Payer: 59

## 2021-09-16 ENCOUNTER — Ambulatory Visit: Payer: 59 | Admitting: Nurse Practitioner

## 2021-09-18 ENCOUNTER — Telehealth: Payer: Self-pay | Admitting: Cardiovascular Disease

## 2021-09-18 NOTE — Telephone Encounter (Signed)
Spoke with patient and he received monitor  He has applied monitor and will wear for 7 days  Advised patient will call when results reviewed

## 2021-09-18 NOTE — Telephone Encounter (Signed)
He had to get a new monitor via mail - if he can apply himself go ahead. If he needs help can come in for nurse visit.   Loel Dubonnet, NP

## 2021-09-18 NOTE — Telephone Encounter (Signed)
Pt states that he was told to call nurse once Heart Monitor arrived. Pt would like call back, please advise

## 2021-09-19 ENCOUNTER — Ambulatory Visit (HOSPITAL_COMMUNITY)
Admission: RE | Admit: 2021-09-19 | Discharge: 2021-09-19 | Disposition: A | Discharge status: Home / Self Care | Payer: Medicare HMO | Source: Ambulatory Visit | Attending: Nurse Practitioner | Admitting: Nurse Practitioner

## 2021-09-19 ENCOUNTER — Encounter (HOSPITAL_COMMUNITY): Payer: Self-pay

## 2021-09-19 DIAGNOSIS — R6881 Early satiety: Secondary | ICD-10-CM | POA: Diagnosis present

## 2021-09-19 DIAGNOSIS — C154 Malignant neoplasm of middle third of esophagus: Secondary | ICD-10-CM | POA: Diagnosis present

## 2021-09-19 MED ORDER — SODIUM CHLORIDE (PF) 0.9 % IJ SOLN
INTRAMUSCULAR | Status: AC
  Filled 2021-09-19: qty 50

## 2021-09-19 MED ORDER — IOHEXOL 300 MG/ML  SOLN
100.0000 mL | Freq: Once | INTRAMUSCULAR | Status: AC | PRN
  Administered 2021-09-19: 100 mL via INTRAVENOUS

## 2021-09-24 ENCOUNTER — Other Ambulatory Visit: Payer: Self-pay

## 2021-09-24 ENCOUNTER — Emergency Department (HOSPITAL_COMMUNITY)
Admission: EM | Admit: 2021-09-24 | Discharge: 2021-09-24 | Disposition: A | Discharge status: Home / Self Care | Payer: Medicare HMO | Attending: Emergency Medicine | Admitting: Emergency Medicine

## 2021-09-24 ENCOUNTER — Emergency Department (HOSPITAL_COMMUNITY): Payer: Medicare HMO

## 2021-09-24 DIAGNOSIS — I10 Essential (primary) hypertension: Secondary | ICD-10-CM | POA: Diagnosis not present

## 2021-09-24 DIAGNOSIS — R0789 Other chest pain: Secondary | ICD-10-CM | POA: Insufficient documentation

## 2021-09-24 DIAGNOSIS — I2510 Atherosclerotic heart disease of native coronary artery without angina pectoris: Secondary | ICD-10-CM | POA: Diagnosis not present

## 2021-09-24 DIAGNOSIS — E119 Type 2 diabetes mellitus without complications: Secondary | ICD-10-CM | POA: Insufficient documentation

## 2021-09-24 DIAGNOSIS — Z8501 Personal history of malignant neoplasm of esophagus: Secondary | ICD-10-CM | POA: Insufficient documentation

## 2021-09-24 LAB — CBC WITH DIFFERENTIAL/PLATELET
Abs Immature Granulocytes: 0.01 10*3/uL (ref 0.00–0.07)
Basophils Absolute: 0 10*3/uL (ref 0.0–0.1)
Basophils Relative: 1 %
Eosinophils Absolute: 0 10*3/uL (ref 0.0–0.5)
Eosinophils Relative: 1 %
HCT: 38.3 % — ABNORMAL LOW (ref 39.0–52.0)
Hemoglobin: 12.1 g/dL — ABNORMAL LOW (ref 13.0–17.0)
Immature Granulocytes: 0 %
Lymphocytes Relative: 27 %
Lymphs Abs: 1.3 10*3/uL (ref 0.7–4.0)
MCH: 24.1 pg — ABNORMAL LOW (ref 26.0–34.0)
MCHC: 31.6 g/dL (ref 30.0–36.0)
MCV: 76.3 fL — ABNORMAL LOW (ref 80.0–100.0)
Monocytes Absolute: 0.6 10*3/uL (ref 0.1–1.0)
Monocytes Relative: 12 %
Neutro Abs: 2.8 10*3/uL (ref 1.7–7.7)
Neutrophils Relative %: 59 %
Platelets: 284 10*3/uL (ref 150–400)
RBC: 5.02 MIL/uL (ref 4.22–5.81)
RDW: 14.7 % (ref 11.5–15.5)
WBC: 4.7 10*3/uL (ref 4.0–10.5)
nRBC: 0 % (ref 0.0–0.2)

## 2021-09-24 LAB — COMPREHENSIVE METABOLIC PANEL
ALT: 19 U/L (ref 0–44)
AST: 23 U/L (ref 15–41)
Albumin: 3.8 g/dL (ref 3.5–5.0)
Alkaline Phosphatase: 67 U/L (ref 38–126)
Anion gap: 12 (ref 5–15)
BUN: 8 mg/dL (ref 8–23)
CO2: 21 mmol/L — ABNORMAL LOW (ref 22–32)
Calcium: 8.9 mg/dL (ref 8.9–10.3)
Chloride: 104 mmol/L (ref 98–111)
Creatinine, Ser: 1.17 mg/dL (ref 0.61–1.24)
GFR, Estimated: >60 mL/min (ref >60)
Glucose, Bld: 114 mg/dL — ABNORMAL HIGH (ref 70–99)
Potassium: 3.5 mmol/L (ref 3.5–5.1)
Sodium: 137 mmol/L (ref 135–145)
Total Bilirubin: 0.4 mg/dL (ref 0.3–1.2)
Total Protein: 6.8 g/dL (ref 6.5–8.1)

## 2021-09-24 LAB — TROPONIN I (HIGH SENSITIVITY)
Troponin I (High Sensitivity): 19 ng/L — ABNORMAL HIGH (ref <18)
Troponin I (High Sensitivity): 17 ng/L (ref <18)

## 2021-09-24 LAB — LIPASE, BLOOD: Lipase: 35 U/L (ref 11–51)

## 2021-09-24 NOTE — ED Provider Notes (Signed)
[facility] EMERGENCY DEPARTMENT Provider Note   CSN: [id number] Arrival date & time: 09/24/21  0308     History  Chief Complaint  Patient presents with   Chest Pain    Juan [name] is a 81 y.o. male.  HPI     This is an 81 year old male who presents with chest discomfort.  Patient reports he began to have intermittent sharp anterior chest pain around 9 PM last night.  It is nonradiating.  Minimal improvement with nitroglycerin.  Currently is asymptomatic.  No palpitations.  Reports diaphoresis and shortness of breath with the event.  Denies any recent illnesses or fevers.  Patient with a history of heart failure.  Denies lower extremity edema.  Patient with history of nonischemic cardiomyopathy.  Last cardiac catheterization with minimal disease in 2021.  Home Medications Prior to Admission medications   Medication Sig Start Date End Date Taking? Authorizing Provider  albuterol (VENTOLIN HFA) 108 (90 Base) MCG/ACT inhaler Inhale 2 puffs into the lungs every 6 (six) hours as needed for wheezing or shortness of breath.  06/14/19  Yes [provider]  amitriptyline (ELAVIL) 10 MG tablet Take 10 mg by mouth at bedtime. 03/11/18  Yes [provider]  aspirin EC 81 MG tablet Take 81 mg by mouth daily.   Yes [provider]  atorvastatin (LIPITOR) 40 MG tablet Take 1 tablet (40 mg total) by mouth daily. 08/21/21  Yes Skeet Latch, MD  carvedilol (COREG) 3.125 MG tablet Take 1 tablet (3.125 mg total) by mouth 2 (two) times daily with a meal. 08/21/21  Yes Skeet Latch, MD  cetirizine (ZYRTEC ALLERGY) 10 MG tablet Take 1 tablet (10 mg total) by mouth daily. Patient taking differently: Take 10 mg by mouth daily as needed for allergies or rhinitis. 11/25/19  Yes Hall-Potvin, [place], PA-C  FARXIGA 5 MG TABS tablet Take 5 mg by mouth daily. 05/17/21  Yes [provider]  fluticasone (FLONASE) 50 MCG/ACT nasal spray Place 1 spray into  both nostrils daily. Patient taking differently: Place 1 spray into both nostrils daily as needed for allergies or rhinitis. 11/25/19  Yes Hall-Potvin, [place], PA-C  gabapentin (NEURONTIN) 300 MG capsule Take 300 mg by mouth 2 (two) times daily.   Yes [provider]  glimepiride (AMARYL) 1 MG tablet Take 1 mg by mouth every morning. 05/30/21  Yes [provider]  Multiple Vitamins-Minerals (CENTRUM SILVER 50+MEN) TABS Take 1 tablet by mouth daily with breakfast.   Yes [provider]  nitroGLYCERIN (NITROSTAT) 0.4 MG SL tablet PLACE 1 TABLET (0.4 MG TOTAL) UNDER THE TONGUE EVERY FIVE MINUTES AS NEEDED FOR CHEST PAIN. Patient taking differently: Place 0.4 mg under the tongue every 5 (five) minutes as needed for chest pain. 05/14/21 05/14/22 Yes Loel Dubonnet, NP  oxyCODONE-acetaminophen (PERCOCET) 10-325 MG tablet Take 1 tablet by mouth every 6 (six) hours as needed for pain. 05/12/21  Yes [provider]  pantoprazole (PROTONIX) 40 MG tablet Take 1 tablet (40 mg total) by mouth daily before breakfast. 08/21/21 02/17/22 Yes Skeet Latch, MD  spironolactone (ALDACTONE) 25 MG tablet Take 1 tablet (25 mg total) by mouth daily. 09/05/20  Yes Skeet Latch, MD  tiZANidine (ZANAFLEX) 2 MG tablet Take 2 mg by mouth every 8 (eight) hours as needed for muscle spasms. 04/08/21  Yes [provider]  furosemide (LASIX) 20 MG tablet Take 1 tablet (20 mg total) by mouth daily. Patient not taking: Reported on 09/24/2021 05/13/21 08/21/21  Laurann Montana  S, NP  sacubitril-valsartan (ENTRESTO) 24-26 MG Take 1 tablet by mouth 2 (two) times daily. Patient not taking: Reported on 09/24/2021 01/17/20   Hosie Poisson, MD      Allergies    Patient has no known allergies.    Review of Systems   Review of Systems  Constitutional:  Positive for diaphoresis. Negative for fever.  Cardiovascular:  Positive for chest pain. Negative for leg swelling.  All other systems  reviewed and are negative.   Physical Exam Updated Vital Signs BP 133/78 (BP Location: Right Arm)   Pulse 65   Temp 98.2 F (36.8 C) (Oral)   Resp 18   SpO2 100%  Physical Exam Vitals and nursing note reviewed.  Constitutional:      Appearance: He is well-developed. He is not ill-appearing.  HENT:     Head: Normocephalic and atraumatic.  Eyes:     Pupils: Pupils are equal, round, and reactive to light.  Cardiovascular:     Rate and Rhythm: Normal rate and regular rhythm.     Heart sounds: Normal heart sounds. No murmur heard. Pulmonary:     Effort: Pulmonary effort is normal. No respiratory distress.     Breath sounds: Normal breath sounds. No wheezing.  Chest:     Chest wall: Tenderness present.  Abdominal:     General: Bowel sounds are normal.     Palpations: Abdomen is soft.     Tenderness: There is no abdominal tenderness. There is no rebound.  Musculoskeletal:     Cervical back: Neck supple.     Right lower leg: No edema.     Left lower leg: No edema.  Lymphadenopathy:     Cervical: No cervical adenopathy.  Skin:    General: Skin is warm and dry.  Neurological:     Mental Status: He is alert and oriented to person, place, and time.  Psychiatric:        Mood and Affect: Mood normal.     ED Results / Procedures / Treatments   Labs (all labs ordered are listed, but only abnormal results are displayed) Labs Reviewed  CBC WITH DIFFERENTIAL/PLATELET - Abnormal; Notable for the following components:      Result Value   Hemoglobin 12.1 (*)    HCT 38.3 (*)    MCV 76.3 (*)    MCH 24.1 (*)    All other components within normal limits  COMPREHENSIVE METABOLIC PANEL - Abnormal; Notable for the following components:   CO2 21 (*)    Glucose, Bld 114 (*)    All other components within normal limits  TROPONIN I (HIGH SENSITIVITY) - Abnormal; Notable for the following components:   Troponin I (High Sensitivity) 19 (*)    All other components within normal limits   LIPASE, BLOOD  TROPONIN I (HIGH SENSITIVITY)    EKG EKG Interpretation  Date/Time:  Wednesday September 24 2021 03:38:17 EDT Ventricular Rate:  62 PR Interval:  198 QRS Duration: 168 QT Interval:  482 QTC Calculation: 489 R Axis:   -37 Text Interpretation: Normal sinus rhythm Left axis deviation Left bundle branch block Abnormal ECG When compared with ECG of 19-Sep-2020 12:52, PREVIOUS ECG IS PRESENT No significant change since last tracing Confirmed by Thayer Jew [phone]) on 09/24/2021 4:54:52 AM  Radiology DG Chest Portable 1 View  Result Date: 09/24/2021 CLINICAL DATA:  Chest pain and cough. EXAM: PORTABLE CHEST 1 VIEW COMPARISON:  Chest CT 5 days ago 09/19/2021, radiograph 09/19/2020 FINDINGS: The cardiomediastinal contours are normal.  No pneumomediastinum. The lungs are clear. Pulmonary vasculature is normal. No consolidation, pleural effusion, or pneumothorax. No acute osseous abnormalities are seen. IMPRESSION: No acute chest findings or explanation for symptoms. Electronically Signed   By: Keith Rake M.D.   On: 09/24/2021 04:01    Procedures Procedures    Medications Ordered in ED Medications - No data to display  ED Course/ Medical Decision Making/ A&P                           Medical Decision Making Amount and/or Complexity of Data Reviewed Labs: ordered. Radiology: ordered.   This patient presents to the ED for concern of chest pain, this involves an extensive number of treatment options, and is a complaint that carries with it a high risk of complications and morbidity.  I considered the following differential and admission for this acute, potentially life threatening condition.  The differential diagnosis includes ACS, pneumonia, pneumothorax, PE  MDM:    This is a an 81 year old male with history of nonischemic cardiomyopathy who presents with chest pain.  It is sharp in nature.  He is nontoxic and vital signs are reassuring.  He took aspirin and  nitroglycerin prior to arrival.  He is currently asymptomatic.  He had a cardiac catheterization in 2021 which showed minimal disease.  He does follow with cardiology.  EKG today is nonischemic and unchanged from prior.  Troponin initially is 19 which is similar to prior troponins repeat troponin is 17.  Feel this argues against acute ischemia especially given fairly recent ischemic evaluation.  Chest x-ray without pneumothorax or pneumonia.  Doubt PE.  Patient has been asymptomatic while in the emergency department.  He is requesting to be discharged home.  Recommend close follow-up with his cardiologist.  (Labs, imaging, consults)  Labs: I Ordered, and personally interpreted labs.  The pertinent results include: CBC, BMP, troponin x2  Imaging Studies ordered: I ordered imaging studies including chest x-ray I independently visualized and interpreted imaging. I agree with the radiologist interpretation  Additional history obtained from chart review.  External records from outside source obtained and reviewed including cardiac catheterization  Cardiac Monitoring: The patient was maintained on a cardiac monitor.  I personally viewed and interpreted the cardiac monitored which showed an underlying rhythm of: Normal sinus rhythm  Reevaluation: After the interventions noted above, I reevaluated the patient and found that they have :resolved  Social Determinants of Health: Lives independently  Disposition: Discharge  Co morbidities that complicate the patient evaluation  Past Medical History:  Diagnosis Date   Aortic atherosclerosis ([place])    Back pain    Barrett esophagus    Bleeding nose    CAD in native artery 07/30/2020   DDD (degenerative disc disease), thoracolumbar    Moderate to severe   Diabetes mellitus without complication ([place])    Diverticulosis    Esophageal cancer ([place]) dx'd 2017   Essential hypertension 07/30/2020   GERD (gastroesophageal reflux disease)    GERD  (gastroesophageal reflux disease) 08/21/2021   Headache    Hearing loss    Right ear   History of umbilical hernia repair    Idiopathic peripheral neuropathy    Iron deficiency anemia    LBBB (left bundle branch block) 02/11/2016   Left hip pain    Severe degenerative changes   Lipoma of neck    Right   Mixed hyperlipidemia    PONV (postoperative nausea and vomiting)  Reflux    Right thyroid nodule    Sciatica 07/30/2020     Medicines No orders of the defined types were placed in this encounter.   I have reviewed the patients home medicines and have made adjustments as needed  Problem List / ED Course: Problem List Items Addressed This Visit   None Visit Diagnoses     Atypical chest pain    -  Primary                   Final Clinical Impression(s) / ED Diagnoses Final diagnoses:  Atypical chest pain    Rx / DC Orders ED Discharge Orders     None         Merryl Hacker, MD 09/24/21 [phone]

## 2021-09-24 NOTE — ED Notes (Signed)
Pt verbalizes understanding of discharge instructions. Opportunity for questions and answers were provided. Pt discharged from the ED.   ?

## 2021-09-24 NOTE — Discharge Instructions (Signed)
You were seen today for chest pain.  Follow-up with your cardiologist if you have ongoing symptoms.  Your work-up today is reassuring.

## 2021-09-24 NOTE — ED Triage Notes (Addendum)
PT arrived via [facility] for cc of sharp, intermittent chest pain starting at approximately 9pm last night rated 3/10 with diaphoresis. Pt administered 3 SL nitro between 9pm and now as well as 324 nitro. LBBB on EKG. Pt is currently pain free.   PTA Vitals BP 123/80 HR 63 SPO2 98% RA RR 18 CBG 138

## 2021-09-26 ENCOUNTER — Telehealth: Payer: Self-pay | Admitting: Hematology

## 2021-09-26 NOTE — Telephone Encounter (Signed)
Called to sch per 6/

## 2021-09-27 ENCOUNTER — Other Ambulatory Visit: Payer: Self-pay

## 2021-09-27 ENCOUNTER — Encounter (HOSPITAL_COMMUNITY): Payer: Self-pay

## 2021-09-27 ENCOUNTER — Emergency Department (HOSPITAL_COMMUNITY)
Admission: EM | Admit: 2021-09-27 | Discharge: 2021-09-27 | Disposition: A | Discharge status: Home / Self Care | Payer: Medicare HMO | Attending: Emergency Medicine | Admitting: Emergency Medicine

## 2021-09-27 DIAGNOSIS — Z0000 Encounter for general adult medical examination without abnormal findings: Secondary | ICD-10-CM | POA: Diagnosis present

## 2021-09-27 DIAGNOSIS — Z7982 Long term (current) use of aspirin: Secondary | ICD-10-CM | POA: Diagnosis not present

## 2021-09-27 LAB — COMPREHENSIVE METABOLIC PANEL
ALT: 21 U/L (ref 0–44)
AST: 25 U/L (ref 15–41)
Albumin: 3.8 g/dL (ref 3.5–5.0)
Alkaline Phosphatase: 83 U/L (ref 38–126)
Anion gap: 9 (ref 5–15)
BUN: 7 mg/dL — ABNORMAL LOW (ref 8–23)
CO2: 24 mmol/L (ref 22–32)
Calcium: 8.9 mg/dL (ref 8.9–10.3)
Chloride: 103 mmol/L (ref 98–111)
Creatinine, Ser: 0.96 mg/dL (ref 0.61–1.24)
GFR, Estimated: >60 mL/min (ref >60)
Glucose, Bld: 118 mg/dL — ABNORMAL HIGH (ref 70–99)
Potassium: 3.6 mmol/L (ref 3.5–5.1)
Sodium: 136 mmol/L (ref 135–145)
Total Bilirubin: 0.4 mg/dL (ref 0.3–1.2)
Total Protein: 7 g/dL (ref 6.5–8.1)

## 2021-09-27 LAB — CBC
HCT: 41.5 % (ref 39.0–52.0)
Hemoglobin: 12.9 g/dL — ABNORMAL LOW (ref 13.0–17.0)
MCH: 23.8 pg — ABNORMAL LOW (ref 26.0–34.0)
MCHC: 31.1 g/dL (ref 30.0–36.0)
MCV: 76.7 fL — ABNORMAL LOW (ref 80.0–100.0)
Platelets: 314 10*3/uL (ref 150–400)
RBC: 5.41 MIL/uL (ref 4.22–5.81)
RDW: 14.9 % (ref 11.5–15.5)
WBC: 5.3 10*3/uL (ref 4.0–10.5)
nRBC: 0 % (ref 0.0–0.2)

## 2021-09-27 NOTE — ED Notes (Signed)
EDP spoke to Pt's daughter.  She is calling an [facility] Popper to come get him.  EDP and daughter both comfortable w/ plan.

## 2021-09-27 NOTE — ED Provider Notes (Signed)
[facility] EMERGENCY DEPARTMENT Provider Note   CSN: [id number] Arrival date & time: 09/27/21  1327     History  CC: A/c broke down  Juan [name] is a 81 y.o. male presenting emerged department with concern for air conditioning breaking down in his house.  He says the department was hot.  Now that is here, he is wanting to leave.  He reports that he has a daughter in town but he is not able to stay with her, though he cannot tell me why.  His daughter by phone tells me that she thinks the patient may have early dementia.  She denies that the air conditioner is broken.  The patient has been calling 911 several times in the past several days, per her report.  He was seen in the ED complaining of chest pain 3 days ago and had a work-up which is unremarkable.  She says he suffers from anxiety and some paranoia.  She is trying to gain power of attorney paperwork, and also is trying to arrange for a formal diagnosis of dementia, but he has not seen a neurologist.  HPI     Home Medications Prior to Admission medications   Medication Sig Start Date End Date Taking? Authorizing Provider  albuterol (VENTOLIN HFA) 108 (90 Base) MCG/ACT inhaler Inhale 2 puffs into the lungs every 6 (six) hours as needed for wheezing or shortness of breath.  06/14/19   [provider]  amitriptyline (ELAVIL) 10 MG tablet Take 10 mg by mouth at bedtime. 03/11/18   [provider]  aspirin EC 81 MG tablet Take 81 mg by mouth daily.    [provider]  atorvastatin (LIPITOR) 40 MG tablet Take 1 tablet (40 mg total) by mouth daily. 08/21/21   Skeet Latch, MD  carvedilol (COREG) 3.125 MG tablet Take 1 tablet (3.125 mg total) by mouth 2 (two) times daily with a meal. 08/21/21   Skeet Latch, MD  cetirizine (ZYRTEC ALLERGY) 10 MG tablet Take 1 tablet (10 mg total) by mouth daily. Patient taking differently: Take 10 mg by mouth daily as needed for allergies or rhinitis.  11/25/19   Hall-Potvin, [place], PA-C  FARXIGA 5 MG TABS tablet Take 5 mg by mouth daily. 05/17/21   [provider]  fluticasone (FLONASE) 50 MCG/ACT nasal spray Place 1 spray into both nostrils daily. Patient taking differently: Place 1 spray into both nostrils daily as needed for allergies or rhinitis. 11/25/19   Hall-Potvin, [place], PA-C  furosemide (LASIX) 20 MG tablet Take 1 tablet (20 mg total) by mouth daily. Patient not taking: Reported on 09/24/2021 05/13/21 08/21/21  Loel Dubonnet, NP  gabapentin (NEURONTIN) 300 MG capsule Take 300 mg by mouth 2 (two) times daily.    [provider]  glimepiride (AMARYL) 1 MG tablet Take 1 mg by mouth every morning. 05/30/21   [provider]  Multiple Vitamins-Minerals (CENTRUM SILVER 50+MEN) TABS Take 1 tablet by mouth daily with breakfast.    [provider]  nitroGLYCERIN (NITROSTAT) 0.4 MG SL tablet PLACE 1 TABLET (0.4 MG TOTAL) UNDER THE TONGUE EVERY FIVE MINUTES AS NEEDED FOR CHEST PAIN. Patient taking differently: Place 0.4 mg under the tongue every 5 (five) minutes as needed for chest pain. 05/14/21 05/14/22  Loel Dubonnet, NP  oxyCODONE-acetaminophen (PERCOCET) 10-325 MG tablet Take 1 tablet by mouth every 6 (six) hours as needed for pain. 05/12/21   [provider]  pantoprazole (PROTONIX) 40 MG tablet Take 1 tablet (  40 mg total) by mouth daily before breakfast. 08/21/21 02/17/22  Skeet Latch, MD  sacubitril-valsartan (ENTRESTO) 24-26 MG Take 1 tablet by mouth 2 (two) times daily. Patient not taking: Reported on 09/24/2021 01/17/20   Hosie Poisson, MD  spironolactone (ALDACTONE) 25 MG tablet Take 1 tablet (25 mg total) by mouth daily. 09/05/20   Skeet Latch, MD  tiZANidine (ZANAFLEX) 2 MG tablet Take 2 mg by mouth every 8 (eight) hours as needed for muscle spasms. 04/08/21   [provider]      Allergies    Patient has no known allergies.    Review of Systems   Review of  Systems  Physical Exam Updated Vital Signs BP (!) 146/86 (BP Location: Right Arm)   Pulse 63   Temp 98 F (36.7 C) (Oral)   Resp 16   SpO2 98%  Physical Exam Constitutional:      General: He is not in acute distress. HENT:     Head: Normocephalic and atraumatic.  Eyes:     Conjunctiva/sclera: Conjunctivae normal.     Pupils: Pupils are equal, round, and reactive to light.  Cardiovascular:     Rate and Rhythm: Normal rate and regular rhythm.  Pulmonary:     Effort: Pulmonary effort is normal. No respiratory distress.  Abdominal:     General: There is no distension.     Tenderness: There is no abdominal tenderness.  Skin:    General: Skin is warm and dry.  Neurological:     General: No focal deficit present.     Mental Status: He is alert. Mental status is at baseline.  Psychiatric:        Mood and Affect: Mood normal.        Behavior: Behavior normal.     ED Results / Procedures / Treatments   Labs (all labs ordered are listed, but only abnormal results are displayed) Labs Reviewed  COMPREHENSIVE METABOLIC PANEL - Abnormal; Notable for the following components:      Result Value   Glucose, Bld 118 (*)    BUN 7 (*)    All other components within normal limits  CBC - Abnormal; Notable for the following components:   Hemoglobin 12.9 (*)    MCV 76.7 (*)    MCH 23.8 (*)    All other components within normal limits    EKG None  Radiology No results found.  Procedures Procedures    Medications Ordered in ED Medications - No data to display  ED Course/ Medical Decision Making/ A&P                           Medical Decision Making Amount and/or Complexity of Data Reviewed Labs: ordered.   Patient is here with concern for heat exposure in the apartment as the air conditioner broke.  It was quite hot and humid today.  He does not show signs of heatstroke or confusion.  He has no other acute complaints.  He is hungry and wanting to leave.  His daughter  provides supplemental history.  I suspect based on the clinical exam the patient may be developing dementia, or have some level of dementia.  He has no other acute complaints.  A normal work-up 3 days ago in the ED.  He does appear stable at this time for discharge in the company of his family, advised that she have him stay with her if possible.  We can place referral to neurology  to try to expedite an office appointment for a dementia evaluation.  She verbalized understanding.        Final Clinical Impression(s) / ED Diagnoses Final diagnoses:  Wellness examination    Rx / DC Orders ED Discharge Orders          Ordered    Ambulatory referral to Neurology       Comments: An appointment is requested in approximately: 1 week Evaluation for suspected dementia Please contact patient's [place] at [phone] to set up appointment, as the patient has likely dementia   09/27/21 1610              Maleigh Bagot, Carola Rhine, MD 09/27/21 1611

## 2021-09-27 NOTE — TOC Initial Note (Signed)
Transition of Care [facility]) - Initial/Assessment Note    Patient Details  Name: Juan [name] MRN: [id number] Date of Birth: [birth year]  [facility] CM/SW Contact:    Verdell Carmine, RN Phone Number: 09/27/2021, 3:36 PM  Clinical Narrative:                  Patient presented with EMS as his apartment air conditioning is not working. Called [facility] apartment number [phone], and left a message for maintenance to call ASAP.   Called daughter Left confidential message to return call . Patient may have to stay with family for the time being.  CM will continue to monitor     Patient Goals and CMS Choice        Expected Discharge Plan and Services                                                Prior Living Arrangements/Services                       Activities of Daily Living      Permission Sought/Granted                  Emotional Assessment              Admission diagnosis:  Enviromental Exposure Patient Active Problem List   Diagnosis Date Noted   GERD (gastroesophageal reflux disease) 08/21/2021   Peripheral arterial disease ([place]) 06/13/2021   Essential hypertension 07/30/2020   CAD in native artery 07/30/2020   Sciatica 07/30/2020   Chronic combined systolic and diastolic heart failure ([place]) 01/14/2020   Emphysema lung ([place]) 01/14/2020   Aortic atherosclerosis ([place]) 01/14/2020   Chest pain, rule out acute myocardial infarction 01/13/2020   DM2 (diabetes mellitus, type 2) ([place]) 01/13/2020   Osteoarthritis of left hip 07/22/2017   Iron deficiency anemia 05/17/2017   LBBB (left bundle branch block) 02/11/2016   Port catheter in place 12/25/2015   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

## 2021-09-27 NOTE — ED Notes (Signed)
Daughter called back w/ Juan [name] information.  This Probation officer wheeled Pt out and confirmed w/ driver.  Pt was adamant walker at bedside belonged to him.  Daughter was adamant he doesn't use a walker.  Walker sent w/ Pt to avoid Pt escalating.

## 2021-09-27 NOTE — ED Triage Notes (Signed)
Patient here for social work consult, here because he ha no air conditioning in apartment. With no complaints. Alert and oriented

## 2021-09-27 NOTE — ED Notes (Signed)
While daughter was speaking to the Pt and attempting to order an [employer] Popper, daughter's phone died/lost service.  Callback attempts are going straight to voicemail.

## 2021-09-27 NOTE — Discharge Instructions (Signed)
A referral was placed to the neurology clinic to try to set up an appointment to evaluate for dementia.  If you do not hear from the clinic in 3 business days, please call the number above and ask for the next available appointment.

## 2021-09-29 ENCOUNTER — Emergency Department (HOSPITAL_COMMUNITY)
Admission: EM | Admit: 2021-09-29 | Discharge: 2021-09-30 | Disposition: A | Discharge status: Home / Self Care | Payer: Medicare HMO | Attending: Emergency Medicine | Admitting: Emergency Medicine

## 2021-09-29 ENCOUNTER — Inpatient Hospital Stay: Payer: Medicare HMO | Attending: Nurse Practitioner | Admitting: Nurse Practitioner

## 2021-09-29 ENCOUNTER — Other Ambulatory Visit: Payer: Self-pay

## 2021-09-29 ENCOUNTER — Emergency Department (HOSPITAL_COMMUNITY): Payer: Medicare HMO

## 2021-09-29 ENCOUNTER — Encounter: Payer: Self-pay | Admitting: Nurse Practitioner

## 2021-09-29 DIAGNOSIS — F0390 Unspecified dementia without behavioral disturbance: Secondary | ICD-10-CM | POA: Insufficient documentation

## 2021-09-29 DIAGNOSIS — Z79899 Other long term (current) drug therapy: Secondary | ICD-10-CM | POA: Diagnosis not present

## 2021-09-29 DIAGNOSIS — Z7982 Long term (current) use of aspirin: Secondary | ICD-10-CM | POA: Diagnosis not present

## 2021-09-29 DIAGNOSIS — C154 Malignant neoplasm of middle third of esophagus: Secondary | ICD-10-CM

## 2021-09-29 DIAGNOSIS — Z8501 Personal history of malignant neoplasm of esophagus: Secondary | ICD-10-CM | POA: Diagnosis not present

## 2021-09-29 DIAGNOSIS — R079 Chest pain, unspecified: Secondary | ICD-10-CM

## 2021-09-29 LAB — CBC
HCT: 40.1 % (ref 39.0–52.0)
Hemoglobin: 12.6 g/dL — ABNORMAL LOW (ref 13.0–17.0)
MCH: 24.3 pg — ABNORMAL LOW (ref 26.0–34.0)
MCHC: 31.4 g/dL (ref 30.0–36.0)
MCV: 77.3 fL — ABNORMAL LOW (ref 80.0–100.0)
Platelets: 153 10*3/uL (ref 150–400)
RBC: 5.19 MIL/uL (ref 4.22–5.81)
RDW: 14.9 % (ref 11.5–15.5)
WBC: 5 10*3/uL (ref 4.0–10.5)
nRBC: 0 % (ref 0.0–0.2)

## 2021-09-29 LAB — BASIC METABOLIC PANEL
Anion gap: 8 (ref 5–15)
BUN: 7 mg/dL — ABNORMAL LOW (ref 8–23)
CO2: 24 mmol/L (ref 22–32)
Calcium: 8.8 mg/dL — ABNORMAL LOW (ref 8.9–10.3)
Chloride: 102 mmol/L (ref 98–111)
Creatinine, Ser: 1.2 mg/dL (ref 0.61–1.24)
GFR, Estimated: >60 mL/min (ref >60)
Glucose, Bld: 120 mg/dL — ABNORMAL HIGH (ref 70–99)
Potassium: 3.4 mmol/L — ABNORMAL LOW (ref 3.5–5.1)
Sodium: 134 mmol/L — ABNORMAL LOW (ref 135–145)

## 2021-09-29 LAB — CBG MONITORING, ED: Glucose-Capillary: 131 mg/dL — ABNORMAL HIGH (ref 70–99)

## 2021-09-29 LAB — TROPONIN I (HIGH SENSITIVITY): Troponin I (High Sensitivity): 16 ng/L (ref <18)

## 2021-09-29 NOTE — ED Triage Notes (Signed)
Pt bib [facility] from home c/o of chest pain that has since resolved. Hx dementia, unable to describe pain. BP 132/82, HR 66, SPO2 99% RA, CBG 127

## 2021-09-29 NOTE — Progress Notes (Signed)
Epic faxed Cira Rue, NP office note to Dr. Collene Mares.  Epic fax confirmation received.

## 2021-09-29 NOTE — ED Provider Notes (Signed)
[facility] EMERGENCY DEPARTMENT Provider Note   CSN: [id number] Arrival date & time: 09/29/21  2102     History  Chief Complaint  Patient presents with   Chest Pain    Juan [name] is a 81 y.o. male.  81 yo M with a chief complaint of chest pain.  This reportedly happened earlier and has resolved.  The patient is demented and does not remember what happened.  Level 5 caveat dementia.  I did discuss the case with the patient's daughter, they felt like the patient has been inappropriately calling 911 every day for the past couple weeks and has had multiple visits to the emergency department without any known etiology.  They think this is more behavioral and are wondering if he could be helped out with some home health or perhaps being placed into a more long-term facility.   Chest Pain      Home Medications Prior to Admission medications   Medication Sig Start Date End Date Taking? Authorizing Provider  albuterol (VENTOLIN HFA) 108 (90 Base) MCG/ACT inhaler Inhale 2 puffs into the lungs every 6 (six) hours as needed for wheezing or shortness of breath.  06/14/19   [provider]  amitriptyline (ELAVIL) 10 MG tablet Take 10 mg by mouth at bedtime. 03/11/18   [provider]  aspirin EC 81 MG tablet Take 81 mg by mouth daily.    [provider]  atorvastatin (LIPITOR) 40 MG tablet Take 1 tablet (40 mg total) by mouth daily. 08/21/21   Skeet Latch, MD  carvedilol (COREG) 3.125 MG tablet Take 1 tablet (3.125 mg total) by mouth 2 (two) times daily with a meal. 08/21/21   Skeet Latch, MD  cetirizine (ZYRTEC ALLERGY) 10 MG tablet Take 1 tablet (10 mg total) by mouth daily. Patient taking differently: Take 10 mg by mouth daily as needed for allergies or rhinitis. 11/25/19   Hall-Potvin, [place], PA-C  FARXIGA 5 MG TABS tablet Take 5 mg by mouth daily. 05/17/21   [provider]  fluticasone (FLONASE) 50 MCG/ACT nasal spray Place  1 spray into both nostrils daily. Patient taking differently: Place 1 spray into both nostrils daily as needed for allergies or rhinitis. 11/25/19   Hall-Potvin, [place], PA-C  furosemide (LASIX) 20 MG tablet Take 1 tablet (20 mg total) by mouth daily. Patient not taking: Reported on 09/24/2021 05/13/21 08/21/21  Loel Dubonnet, NP  gabapentin (NEURONTIN) 300 MG capsule Take 300 mg by mouth 2 (two) times daily.    [provider]  glimepiride (AMARYL) 1 MG tablet Take 1 mg by mouth every morning. 05/30/21   [provider]  Multiple Vitamins-Minerals (CENTRUM SILVER 50+MEN) TABS Take 1 tablet by mouth daily with breakfast.    [provider]  nitroGLYCERIN (NITROSTAT) 0.4 MG SL tablet PLACE 1 TABLET (0.4 MG TOTAL) UNDER THE TONGUE EVERY FIVE MINUTES AS NEEDED FOR CHEST PAIN. Patient taking differently: Place 0.4 mg under the tongue every 5 (five) minutes as needed for chest pain. 05/14/21 05/14/22  Loel Dubonnet, NP  oxyCODONE-acetaminophen (PERCOCET) 10-325 MG tablet Take 1 tablet by mouth every 6 (six) hours as needed for pain. 05/12/21   [provider]  pantoprazole (PROTONIX) 40 MG tablet Take 1 tablet (40 mg total) by mouth daily before breakfast. 08/21/21 02/17/22  Skeet Latch, MD  sacubitril-valsartan (ENTRESTO) 24-26 MG Take 1 tablet by mouth 2 (two) times daily. Patient not taking: Reported on 09/24/2021 01/17/20   Hosie Poisson, MD  spironolactone (  ALDACTONE) 25 MG tablet Take 1 tablet (25 mg total) by mouth daily. 09/05/20   Skeet Latch, MD  tiZANidine (ZANAFLEX) 2 MG tablet Take 2 mg by mouth every 8 (eight) hours as needed for muscle spasms. 04/08/21   [provider]      Allergies    Patient has no known allergies.    Review of Systems   Review of Systems  Cardiovascular:  Positive for chest pain.    Physical Exam Updated Vital Signs BP (!) 153/75   Pulse 64   Temp 98.3 F (36.8 C) (Oral)   Resp 14   SpO2 99%   Physical Exam Vitals and nursing note reviewed.  Constitutional:      Appearance: He is well-developed.  HENT:     Head: Normocephalic and atraumatic.  Eyes:     Pupils: Pupils are equal, round, and reactive to light.  Neck:     Vascular: No JVD.  Cardiovascular:     Rate and Rhythm: Normal rate and regular rhythm.     Heart sounds: No murmur heard.    No friction rub. No gallop.  Pulmonary:     Effort: No respiratory distress.     Breath sounds: No wheezing.  Abdominal:     General: There is no distension.     Tenderness: There is no abdominal tenderness. There is no guarding or rebound.  Musculoskeletal:        General: Normal range of motion.     Cervical back: Normal range of motion and neck supple.  Skin:    Coloration: Skin is not pale.     Findings: No rash.  Neurological:     Mental Status: He is alert and oriented to person, place, and time.  Psychiatric:        Behavior: Behavior normal.     ED Results / Procedures / Treatments   Labs (all labs ordered are listed, but only abnormal results are displayed) Labs Reviewed  BASIC METABOLIC PANEL - Abnormal; Notable for the following components:      Result Value   Sodium 134 (*)    Potassium 3.4 (*)    Glucose, Bld 120 (*)    BUN 7 (*)    Calcium 8.8 (*)    All other components within normal limits  CBC - Abnormal; Notable for the following components:   Hemoglobin 12.6 (*)    MCV 77.3 (*)    MCH 24.3 (*)    All other components within normal limits  CBG MONITORING, ED - Abnormal; Notable for the following components:   Glucose-Capillary 131 (*)    All other components within normal limits  TROPONIN I (HIGH SENSITIVITY)  TROPONIN I (HIGH SENSITIVITY)    EKG None  Radiology DG Chest Portable 1 View  Result Date: 09/29/2021 CLINICAL DATA:  Chest pain. EXAM: PORTABLE CHEST 1 VIEW COMPARISON:  September 24, 2021 FINDINGS: The heart size and mediastinal contours are within normal limits. Low lung volumes are  noted. There is subsequent crowding of the bibasilar bronchovascular lung markings. There is no evidence of acute infiltrate, pleural effusion or pneumothorax. Multilevel degenerative changes seen throughout the thoracic spine. IMPRESSION: Low lung volumes without acute or active cardiopulmonary disease. Electronically Signed   By: Virgina Norfolk M.D.   On: 09/29/2021 21:41    Procedures Procedures    Medications Ordered in ED Medications - No data to display  ED Course/ Medical Decision Making/ A&P  Medical Decision Making Amount and/or Complexity of Data Reviewed Labs: ordered. Radiology: ordered. ECG/medicine tests: ordered.   81 yo M with a chief complaint of chest pain.  I am unable to get any history illness is the patient is the only historian and he does not remember what happened.  He did grimace at 1 time when he had a reflux event in the room and so perhaps he had some reflux earlier.  We will obtain a delta troponin to assess for possible MI.  We will consult social work as the family is little bit worried about him at home.  At this point he has no obvious signs of any issues at home.  Home health orders were placed.  Social work to arrange for home health for him.  PCP follow-up.  On record review the patient does have a history of esophageal cancer, due to age and risk factors not thought to benefit from surgery.  Based on his oncology note from today there appear to be some significant chest and back pain from that.  Had a recent CT scan done a couple weeks ago without any obvious residual disease.  Awaiting delta troponin.  Patient care was signed out to Dr. Dina Rich, please see their note for further details care in the ED.  The patients results and plan were reviewed and discussed.   Any x-rays performed were independently reviewed by myself.   Differential diagnosis were considered with the presenting HPI.  Medications - No data to  display  Vitals:   09/29/21 2125  BP: (!) 153/75  Pulse: 64  Resp: 14  Temp: 98.3 F (36.8 C)  TempSrc: Oral  SpO2: 99%    Final diagnoses:  Nonspecific chest pain    Admission/ observation were discussed with the admitting physician, patient and/or family and they are comfortable with the plan.          Final Clinical Impression(s) / ED Diagnoses Final diagnoses:  Nonspecific chest pain    Rx / DC Orders ED Discharge Orders          [place]        09/29/21 2257    Face-to-face encounter (required for Medicare/Medicaid patients)       Comments: I Cecilio Asper certify that this patient is under my care and that I, or a nurse practitioner or physician's assistant working with me, had a face-to-face encounter that meets the physician face-to-face encounter requirements with this patient on 09/29/2021. The encounter with the patient was in whole, or in part for the following medical condition(s) which is the primary reason for home health care (List medical condition): Patient with worsening dementia making it unsafe to leave the house on his own   09/29/21 [place], [place], DO 09/29/21 2309
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
## 2021-09-30 LAB — TROPONIN I (HIGH SENSITIVITY): Troponin I (High Sensitivity): 18 ng/L — ABNORMAL HIGH (ref <18)

## 2021-09-30 NOTE — ED Notes (Signed)
Spoke with ED provider Dr. Dina Rich to verify social work consult was in place. Per provider social work was already involved and the pt was to be d/c home. Pt is ambulatory and unable to use  [facility] and to contact the daughter and advise her that she would have to arrange for transportation for the pt home.

## 2021-09-30 NOTE — Discharge Instructions (Signed)
You were seen today for chest pain.  Your work-up was reassuring.  You have been referred for home health resources.  You will be discharged home and should receive follow-up regarding these resources.

## 2021-09-30 NOTE — ED Notes (Signed)
Pt ambulated to nurses station without assistance asking for this RN to leave a message on [place] advised pt to hang up and we would call back

## 2021-09-30 NOTE — ED Notes (Signed)
Spoke with daughter Caryl Pina and made her aware that this RN was advised the pt is ambulatory and able to answer questions appropriately with intermittent confusion and doesn't qualify for ambulance transportation home and that social work was already involved. Daughter at this time is requesting for pt to call his friend Juanda Crumble to see if he will come get him. Spoke with pt and made him aware to call Juanda Crumble that he was d/c and waiting for transportation home

## 2021-09-30 NOTE — ED Notes (Signed)
This RN spoke with SW regarding pt going home. Per daughter, pt lives alone but is checked on multiple times a day. Pt has undiagnosed dementia and has a neurologist appt scheduled. SW cleared pt to go home. [place] MD made aware.

## 2021-09-30 NOTE — ED Provider Notes (Signed)
Patient signed out pending repeat troponin.  In brief has been seen and evaluated multiple times over the last few weeks for multiple complaints including chest pain.  Has had negative work-up.  Increasing anxiety and question dementia as a diagnosis after prior provider spoke to daughter.  Social work has been involved and is helping provide home health resources.  Chest pain work-up today is reassuring with stable troponin comparable to prior.  No active chest pain.  He does live alone but per social work, has someone that can check on him frequently until more resources are in place.  Will discharge home.   Merryl Hacker, MD 09/30/21 [phone]

## 2021-09-30 NOTE — ED Notes (Signed)
Pt walked out of ED with SW. Bard Herbert came to pick up pt.

## 2021-09-30 NOTE — ED Notes (Signed)
Per previous nurse pt can not be d/c home alone due to pt having confusion, that he was a risk to himself and others, [facility] wouldn't transport him home unless someone was there and that social work would be involved in the morning.

## 2021-09-30 NOTE — ED Notes (Signed)
This RN attempted to update pts daughter regarding discharge information with no success. Pt will be PTAR hold until family can successfully be contacted.

## 2021-09-30 NOTE — ED Notes (Signed)
Pt in room walking around. Updated on plan of care. Breakfast at bedside. Pt consumed 100% of breakfast. Attempted to call daughter for transport. No answer and unable to leave a voice message.

## 2021-09-30 NOTE — ED Notes (Signed)
Called to room by pt stating that someone was talking about his family through the call bell button. Explained to pt that what he is hearing is the T.V. . Tried to explain to pt multiple times that the T.V. is talking through the call bell.
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
## 2021-09-30 NOTE — ED Notes (Signed)
Spoke with Dr. Dina Rich and was advised social work being involved later in the morning was due to pt needing transportation home. If pt could arrange for transportation then he was cleared to leave

## 2021-09-30 NOTE — ED Notes (Signed)
Pt daughter Caryl Pina called pt phone and pt requesting this RN to answer. Spoke with daughter and advised that per the previous nurse there was an attempt to call her multiple times with no answer and that the pt couldn't be d/c home due to no one being at the residence. Per daughter she has no transportation and she lives 20 min away from him. Advised daughter that per previous nurse social work would be involved this morning

## 2021-09-30 NOTE — ED Notes (Signed)
Received verbal report from [place] at this time

## 2021-10-01 ENCOUNTER — Telehealth: Payer: Self-pay | Admitting: Nurse Practitioner

## 2021-10-01 NOTE — Telephone Encounter (Signed)
Unable to leave message with follow-up appointment per 7/3 los. Mailed calendar.

## 2021-10-04 ENCOUNTER — Emergency Department (HOSPITAL_COMMUNITY)
Admission: EM | Admit: 2021-10-04 | Discharge: 2021-10-04 | Disposition: A | Discharge status: Home / Self Care | Payer: Medicare HMO | Attending: Emergency Medicine | Admitting: Emergency Medicine

## 2021-10-04 ENCOUNTER — Other Ambulatory Visit: Payer: Self-pay

## 2021-10-04 ENCOUNTER — Encounter (HOSPITAL_COMMUNITY): Payer: Self-pay | Admitting: Emergency Medicine

## 2021-10-04 DIAGNOSIS — Z7689 Persons encountering health services in other specified circumstances: Secondary | ICD-10-CM | POA: Diagnosis not present

## 2021-10-04 DIAGNOSIS — Z7982 Long term (current) use of aspirin: Secondary | ICD-10-CM | POA: Diagnosis not present

## 2021-10-04 DIAGNOSIS — Z79899 Other long term (current) drug therapy: Secondary | ICD-10-CM | POA: Insufficient documentation

## 2021-10-04 DIAGNOSIS — F0390 Unspecified dementia without behavioral disturbance: Secondary | ICD-10-CM | POA: Diagnosis not present

## 2021-10-04 DIAGNOSIS — Z789 Other specified health status: Secondary | ICD-10-CM

## 2021-10-04 LAB — CBC
HCT: 38.8 % — ABNORMAL LOW (ref 39.0–52.0)
Hemoglobin: 12.1 g/dL — ABNORMAL LOW (ref 13.0–17.0)
MCH: 23.9 pg — ABNORMAL LOW (ref 26.0–34.0)
MCHC: 31.2 g/dL (ref 30.0–36.0)
MCV: 76.5 fL — ABNORMAL LOW (ref 80.0–100.0)
Platelets: 311 10*3/uL (ref 150–400)
RBC: 5.07 MIL/uL (ref 4.22–5.81)
RDW: 15.5 % (ref 11.5–15.5)
WBC: 5.4 10*3/uL (ref 4.0–10.5)
nRBC: 0 % (ref 0.0–0.2)

## 2021-10-04 LAB — URINALYSIS, ROUTINE W REFLEX MICROSCOPIC
Bilirubin Urine: NEGATIVE
Glucose, UA: NEGATIVE mg/dL
Hgb urine dipstick: NEGATIVE
Ketones, ur: NEGATIVE mg/dL
Leukocytes,Ua: NEGATIVE
Nitrite: NEGATIVE
Protein, ur: NEGATIVE mg/dL
Specific Gravity, Urine: 1.023 (ref 1.005–1.030)
pH: 5 (ref 5.0–8.0)

## 2021-10-04 LAB — BASIC METABOLIC PANEL
Anion gap: 12 (ref 5–15)
BUN: 7 mg/dL — ABNORMAL LOW (ref 8–23)
CO2: 21 mmol/L — ABNORMAL LOW (ref 22–32)
Calcium: 9.1 mg/dL (ref 8.9–10.3)
Chloride: 106 mmol/L (ref 98–111)
Creatinine, Ser: 0.96 mg/dL (ref 0.61–1.24)
GFR, Estimated: >60 mL/min (ref >60)
Glucose, Bld: 104 mg/dL — ABNORMAL HIGH (ref 70–99)
Potassium: 3.3 mmol/L — ABNORMAL LOW (ref 3.5–5.1)
Sodium: 139 mmol/L (ref 135–145)

## 2021-10-04 NOTE — Progress Notes (Signed)
PT Cancellation Note  Patient Details Name: Juan [name] MRN: [id number] DOB: 02-01-1941   Cancelled Treatment:    Reason Eval/Treat Not Completed: Other (comment)  Room empty and per nurse tech, patient just discharged home with family.   [place]  Office [phone]   Rexanne Mano 10/04/2021, 9:02 AM

## 2021-10-04 NOTE — ED Provider Notes (Signed)
[facility] EMERGENCY DEPARTMENT Provider Note   CSN: [id number] Arrival date & time: 10/04/21  0004     History  No chief complaint on file.   Juan [name] is a 81 y.o. male.  81 year old male who presents the ER today for "dementia".  Patient states that he is here because he has no one at home to help take care of him.  Nursing spoke with family and they state that he think he probably has early dementia and they plan on taking him to [place] this weekend so he stayed with another family member.  He cannot come get him right now.  This suggested: Juan [name].  Patient has 0 complaints.        Home Medications Prior to Admission medications   Medication Sig Start Date End Date Taking? Authorizing Provider  albuterol (VENTOLIN HFA) 108 (90 Base) MCG/ACT inhaler Inhale 2 puffs into the lungs every 6 (six) hours as needed for wheezing or shortness of breath.  06/14/19   [provider]  amitriptyline (ELAVIL) 10 MG tablet Take 10 mg by mouth at bedtime. 03/11/18   [provider]  aspirin EC 81 MG tablet Take 81 mg by mouth daily.    [provider]  atorvastatin (LIPITOR) 40 MG tablet Take 1 tablet (40 mg total) by mouth daily. 08/21/21   Skeet Latch, MD  carvedilol (COREG) 3.125 MG tablet Take 1 tablet (3.125 mg total) by mouth 2 (two) times daily with a meal. 08/21/21   Skeet Latch, MD  cetirizine (ZYRTEC ALLERGY) 10 MG tablet Take 1 tablet (10 mg total) by mouth daily. Patient taking differently: Take 10 mg by mouth daily as needed for allergies or rhinitis. 11/25/19   Hall-Potvin, [place], PA-C  FARXIGA 5 MG TABS tablet Take 5 mg by mouth daily. 05/17/21   [provider]  fluticasone (FLONASE) 50 MCG/ACT nasal spray Place 1 spray into both nostrils daily. Patient taking differently: Place 1 spray into both nostrils daily as needed for allergies or rhinitis. 11/25/19   Hall-Potvin, [place], PA-C  furosemide (LASIX) 20 MG  tablet Take 1 tablet (20 mg total) by mouth daily. Patient not taking: Reported on 09/24/2021 05/13/21 08/21/21  Loel Dubonnet, NP  gabapentin (NEURONTIN) 300 MG capsule Take 300 mg by mouth 2 (two) times daily.    [provider]  glimepiride (AMARYL) 1 MG tablet Take 1 mg by mouth every morning. 05/30/21   [provider]  Multiple Vitamins-Minerals (CENTRUM SILVER 50+MEN) TABS Take 1 tablet by mouth daily with breakfast.    [provider]  nitroGLYCERIN (NITROSTAT) 0.4 MG SL tablet PLACE 1 TABLET (0.4 MG TOTAL) UNDER THE TONGUE EVERY FIVE MINUTES AS NEEDED FOR CHEST PAIN. Patient taking differently: Place 0.4 mg under the tongue every 5 (five) minutes as needed for chest pain. 05/14/21 05/14/22  Loel Dubonnet, NP  oxyCODONE-acetaminophen (PERCOCET) 10-325 MG tablet Take 1 tablet by mouth every 6 (six) hours as needed for pain. 05/12/21   [provider]  pantoprazole (PROTONIX) 40 MG tablet Take 1 tablet (40 mg total) by mouth daily before breakfast. 08/21/21 02/17/22  Skeet Latch, MD  sacubitril-valsartan (ENTRESTO) 24-26 MG Take 1 tablet by mouth 2 (two) times daily. Patient not taking: Reported on 09/24/2021 01/17/20   Hosie Poisson, MD  spironolactone (ALDACTONE) 25 MG tablet Take 1 tablet (25 mg total) by mouth daily. 09/05/20   Skeet Latch, MD  tiZANidine (ZANAFLEX) 2 MG tablet Take 2 mg by mouth every 8 (  eight) hours as needed for muscle spasms. 04/08/21   [provider]      Allergies    Patient has no known allergies.    Review of Systems   Review of Systems  Physical Exam Updated Vital Signs BP (!) 147/87 (BP Location: Right Arm)   Pulse 65   Temp (!) 97.5 F (36.4 C) (Oral)   Resp 17   SpO2 98%  Physical Exam Vitals and nursing note reviewed.  Constitutional:      Appearance: He is well-developed.  HENT:     Head: Normocephalic and atraumatic.     Mouth/Throat:     Mouth: Mucous membranes are moist.     Pharynx:  Oropharynx is clear.  Eyes:     Pupils: Pupils are equal, round, and reactive to light.  Cardiovascular:     Rate and Rhythm: Normal rate.  Pulmonary:     Effort: Pulmonary effort is normal. No respiratory distress.  Abdominal:     General: Abdomen is flat. There is no distension.  Musculoskeletal:        General: Normal range of motion.     Cervical back: Normal range of motion.  Skin:    General: Skin is warm and dry.  Neurological:     General: No focal deficit present.     Mental Status: He is alert.     ED Results / Procedures / Treatments   Labs (all labs ordered are listed, but only abnormal results are displayed) Labs Reviewed  BASIC METABOLIC PANEL - Abnormal; Notable for the following components:      Result Value   Potassium 3.3 (*)    CO2 21 (*)    Glucose, Bld 104 (*)    BUN 7 (*)    All other components within normal limits  CBC - Abnormal; Notable for the following components:   Hemoglobin 12.1 (*)    HCT 38.8 (*)    MCV 76.5 (*)    MCH 23.9 (*)    All other components within normal limits  URINALYSIS, ROUTINE W REFLEX MICROSCOPIC    EKG None  Radiology No results found.  Procedures Procedures    Medications Ordered in ED Medications - No data to display  ED Course/ Medical Decision Making/ A&P                           Medical Decision Making Amount and/or Complexity of Data Reviewed Labs: ordered.   No medical work-up indicated.  Patient will need a safe ride home.  He wants to leave now with does not have ability to get home right now.  I do not think he needs to be involuntarily committed necessarily as well at this time, I do not feel like he is okay to just walk home on the street.  We will keep him here till someone can pick him up.   Final Clinical Impression(s) / ED Diagnoses Final diagnoses:  Need for follow-up by social worker    Rx / DC Orders ED Discharge Orders     None         Anai Lipson, Corene Cornea, MD 10/04/21  [phone]

## 2021-10-04 NOTE — ED Triage Notes (Signed)
Pt BIB [facility], pt reports that he has dementia and was feeling unsure about where he was tonight. Pt reports he lives alone, stating he kicked out his caregiver because she was "getting on my nerves".

## 2021-10-04 NOTE — ED Notes (Signed)
Pt would like to leave. Encouraged to stay and wait for ride in morning.

## 2021-10-04 NOTE — ED Notes (Signed)
Juan [name] [phone] for ride.

## 2021-10-04 NOTE — ED Notes (Signed)
Called Daughter Ottis Vacha @ [phone]. She was unaware pt was here. Reports that pt has been 'doing this a lot' recently- hitting life alert button and going to hospital. She reports he has no other real complaints other than not wanting to stay at home alone- dementia baseline. There is a plan for family to take the pt to [place] to stay with them- timeline for this weekend. Daughter says we can call 'Evlyn Clines' to pick up the pt- unable to provide number. This RN informs Daughter that we will call if we cannot find another method of transportation for him. Daughter says she can get an [facility] Popper for him- This RN encourages against that due to his confusion.

## 2021-10-04 NOTE — ED Notes (Signed)
[name] called to pick up pt-says he can't pick up the patient until 8am.

## 2021-10-05 ENCOUNTER — Other Ambulatory Visit: Payer: Self-pay

## 2021-10-05 ENCOUNTER — Emergency Department (HOSPITAL_COMMUNITY): Payer: Medicare HMO

## 2021-10-05 ENCOUNTER — Emergency Department (HOSPITAL_COMMUNITY)
Admission: EM | Admit: 2021-10-05 | Discharge: 2021-10-06 | Disposition: A | Discharge status: Home / Self Care | Payer: Medicare HMO | Attending: Emergency Medicine | Admitting: Emergency Medicine

## 2021-10-05 ENCOUNTER — Encounter (HOSPITAL_COMMUNITY): Payer: Self-pay | Admitting: Emergency Medicine

## 2021-10-05 DIAGNOSIS — R0602 Shortness of breath: Secondary | ICD-10-CM | POA: Insufficient documentation

## 2021-10-05 DIAGNOSIS — I2510 Atherosclerotic heart disease of native coronary artery without angina pectoris: Secondary | ICD-10-CM | POA: Insufficient documentation

## 2021-10-05 DIAGNOSIS — Z7982 Long term (current) use of aspirin: Secondary | ICD-10-CM | POA: Diagnosis not present

## 2021-10-05 DIAGNOSIS — Z8501 Personal history of malignant neoplasm of esophagus: Secondary | ICD-10-CM | POA: Insufficient documentation

## 2021-10-05 DIAGNOSIS — Z7984 Long term (current) use of oral hypoglycemic drugs: Secondary | ICD-10-CM | POA: Diagnosis not present

## 2021-10-05 DIAGNOSIS — Z711 Person with feared health complaint in whom no diagnosis is made: Secondary | ICD-10-CM | POA: Diagnosis not present

## 2021-10-05 DIAGNOSIS — I10 Essential (primary) hypertension: Secondary | ICD-10-CM | POA: Diagnosis not present

## 2021-10-05 DIAGNOSIS — Z79899 Other long term (current) drug therapy: Secondary | ICD-10-CM | POA: Insufficient documentation

## 2021-10-05 DIAGNOSIS — E1140 Type 2 diabetes mellitus with diabetic neuropathy, unspecified: Secondary | ICD-10-CM | POA: Diagnosis not present

## 2021-10-05 DIAGNOSIS — F419 Anxiety disorder, unspecified: Secondary | ICD-10-CM | POA: Insufficient documentation

## 2021-10-05 LAB — CBC WITH DIFFERENTIAL/PLATELET
Abs Immature Granulocytes: 0.01 10*3/uL (ref 0.00–0.07)
Basophils Absolute: 0 10*3/uL (ref 0.0–0.1)
Basophils Relative: 1 %
Eosinophils Absolute: 0.1 10*3/uL (ref 0.0–0.5)
Eosinophils Relative: 2 %
HCT: 38.9 % — ABNORMAL LOW (ref 39.0–52.0)
Hemoglobin: 11.8 g/dL — ABNORMAL LOW (ref 13.0–17.0)
Immature Granulocytes: 0 %
Lymphocytes Relative: 22 %
Lymphs Abs: 1.1 10*3/uL (ref 0.7–4.0)
MCH: 23.6 pg — ABNORMAL LOW (ref 26.0–34.0)
MCHC: 30.3 g/dL (ref 30.0–36.0)
MCV: 77.8 fL — ABNORMAL LOW (ref 80.0–100.0)
Monocytes Absolute: 0.5 10*3/uL (ref 0.1–1.0)
Monocytes Relative: 10 %
Neutro Abs: 3.2 10*3/uL (ref 1.7–7.7)
Neutrophils Relative %: 65 %
Platelets: 317 10*3/uL (ref 150–400)
RBC: 5 MIL/uL (ref 4.22–5.81)
RDW: 15.5 % (ref 11.5–15.5)
WBC: 4.9 10*3/uL (ref 4.0–10.5)
nRBC: 0 % (ref 0.0–0.2)

## 2021-10-05 LAB — COMPREHENSIVE METABOLIC PANEL
ALT: 18 U/L (ref 0–44)
AST: 20 U/L (ref 15–41)
Albumin: 3.7 g/dL (ref 3.5–5.0)
Alkaline Phosphatase: 84 U/L (ref 38–126)
Anion gap: 14 (ref 5–15)
BUN: 8 mg/dL (ref 8–23)
CO2: 22 mmol/L (ref 22–32)
Calcium: 9 mg/dL (ref 8.9–10.3)
Chloride: 106 mmol/L (ref 98–111)
Creatinine, Ser: 1.05 mg/dL (ref 0.61–1.24)
GFR, Estimated: >60 mL/min (ref >60)
Glucose, Bld: 123 mg/dL — ABNORMAL HIGH (ref 70–99)
Potassium: 3.6 mmol/L (ref 3.5–5.1)
Sodium: 142 mmol/L (ref 135–145)
Total Bilirubin: 0.6 mg/dL (ref 0.3–1.2)
Total Protein: 7.1 g/dL (ref 6.5–8.1)

## 2021-10-05 LAB — TROPONIN I (HIGH SENSITIVITY): Troponin I (High Sensitivity): 13 ng/L (ref <18)

## 2021-10-05 MED ORDER — ZIPRASIDONE MESYLATE 20 MG IM SOLR
INTRAMUSCULAR | Status: AC
  Administered 2021-10-05: 20 mg
  Filled 2021-10-05: qty 20

## 2021-10-05 MED ORDER — CARVEDILOL 3.125 MG PO TABS
3.1250 mg | ORAL_TABLET | Freq: Two times a day (BID) | ORAL | Status: DC
  Administered 2021-10-05 – 2021-10-06 (×2): 3.125 mg via ORAL
  Filled 2021-10-05 (×2): qty 1

## 2021-10-05 MED ORDER — STERILE WATER FOR INJECTION IJ SOLN
INTRAMUSCULAR | Status: AC
  Filled 2021-10-05: qty 10

## 2021-10-05 MED ORDER — PANTOPRAZOLE SODIUM 40 MG PO TBEC
40.0000 mg | DELAYED_RELEASE_TABLET | Freq: Every day | ORAL | Status: DC
  Filled 2021-10-05: qty 1

## 2021-10-05 MED ORDER — ALBUTEROL SULFATE HFA 108 (90 BASE) MCG/ACT IN AERS: 2.0000 | INHALATION_SPRAY | Freq: Four times a day (QID) | RESPIRATORY_TRACT | Status: DC | PRN

## 2021-10-05 MED ORDER — GABAPENTIN 300 MG PO CAPS
300.0000 mg | ORAL_CAPSULE | Freq: Two times a day (BID) | ORAL | Status: DC
  Administered 2021-10-05 (×2): 300 mg via ORAL
  Filled 2021-10-05 (×3): qty 1

## 2021-10-05 MED ORDER — ATORVASTATIN CALCIUM 40 MG PO TABS
40.0000 mg | ORAL_TABLET | Freq: Every day | ORAL | Status: DC
  Administered 2021-10-05: 40 mg via ORAL
  Filled 2021-10-05 (×2): qty 1

## 2021-10-05 MED ORDER — ASPIRIN 81 MG PO TBEC
81.0000 mg | DELAYED_RELEASE_TABLET | Freq: Every day | ORAL | Status: DC
  Administered 2021-10-05 – 2021-10-06 (×2): 81 mg via ORAL
  Filled 2021-10-05 (×2): qty 1

## 2021-10-05 MED ORDER — ALBUTEROL SULFATE (2.5 MG/3ML) 0.083% IN NEBU: 2.5000 mg | INHALATION_SOLUTION | Freq: Four times a day (QID) | RESPIRATORY_TRACT | Status: DC | PRN

## 2021-10-05 MED ORDER — GLIMEPIRIDE 1 MG PO TABS
1.0000 mg | ORAL_TABLET | Freq: Every morning | ORAL | Status: DC
  Administered 2021-10-05: 1 mg via ORAL
  Filled 2021-10-05 (×2): qty 1

## 2021-10-05 MED ORDER — AMITRIPTYLINE HCL 10 MG PO TABS
10.0000 mg | ORAL_TABLET | Freq: Every day | ORAL | Status: DC
  Administered 2021-10-05: 10 mg via ORAL
  Filled 2021-10-05 (×3): qty 1

## 2021-10-05 MED ORDER — SPIRONOLACTONE 25 MG PO TABS
25.0000 mg | ORAL_TABLET | Freq: Every day | ORAL | Status: DC
  Administered 2021-10-05: 25 mg via ORAL
  Filled 2021-10-05 (×2): qty 1

## 2021-10-05 MED ORDER — DAPAGLIFLOZIN PROPANEDIOL 5 MG PO TABS
5.0000 mg | ORAL_TABLET | Freq: Every day | ORAL | Status: DC
  Administered 2021-10-05: 5 mg via ORAL
  Filled 2021-10-05 (×2): qty 1

## 2021-10-05 NOTE — ED Notes (Signed)
Pt awake, axox4. RN gave pt warm blanket and readjusted pt in bed.

## 2021-10-05 NOTE — ED Triage Notes (Signed)
Patient arrived with EMS from home reports SOB this evening and left lower back pain onset last night , patient adds chest tightness.

## 2021-10-05 NOTE — ED Notes (Signed)
Contacting TTS.  Per MD, awaiting pharmacy consult to place home med orders.

## 2021-10-05 NOTE — ED Notes (Signed)
Unsuccessful attempts of contacting pt family members.

## 2021-10-05 NOTE — ED Notes (Addendum)
PT able to answer all ao questions appropriately.  He is calm and cooperative.  States he vaguely remembers  getting upset last night and hitting people.  He states he is being tx for new onset dementia.

## 2021-10-05 NOTE — ED Notes (Signed)
Pt walking in hallways verbally aggressive with staff and attempting to punch / kick staff.

## 2021-10-05 NOTE — ED Provider Notes (Signed)
Seen after prior EDP.  Patient is currently calm and comfortable.  TOC evaluation pending.  This provider called the patient's daughter at 100.  No answer at that time.     Valarie Merino, MD 10/05/21 [phone]

## 2021-10-05 NOTE — ED Notes (Signed)
Pt ambulatory to bathroom

## 2021-10-05 NOTE — ED Provider Notes (Addendum)
[facility] EMERGENCY DEPARTMENT Provider Note   CSN: [id number] Arrival date & time: 10/05/21  0200     History  Chief Complaint  Patient presents with   Shortness of Breath   Back Pain    Juan [name] is a 81 y.o. male.  HPI     This is an 81 year old male who presents to the emergency department with complaint of pain all over.  Patient has been seen and evaluated multiple times in the last several weeks.  He lives alone.  Concerns for early dementia.  He was last seen yesterday.  At that time per chart review, it appears that patient's family was going to take him to [place] and they were to have him assessed for dementia; however, he returns this evening.  He states that he began to feel bad after "the baseball game finished."  He describes pain all over and pain in his back.  He states he has chest pain and shortness of breath.  These are similar vague symptoms to his prior evaluations.  I have personally seen him twice since the end of June.  Chart again reviewed.  Social work was to set up home health; however, notes from yesterday revealed that "he kicked out his caregiver."  I am not sure as to the validity of this statement.  Nursing and I have been unable to get up with his daughter this evening.  Home Medications Prior to Admission medications   Medication Sig Start Date End Date Taking? Authorizing Provider  albuterol (VENTOLIN HFA) 108 (90 Base) MCG/ACT inhaler Inhale 2 puffs into the lungs every 6 (six) hours as needed for wheezing or shortness of breath.  06/14/19   [provider]  amitriptyline (ELAVIL) 10 MG tablet Take 10 mg by mouth at bedtime. 03/11/18   [provider]  aspirin EC 81 MG tablet Take 81 mg by mouth daily.    [provider]  atorvastatin (LIPITOR) 40 MG tablet Take 1 tablet (40 mg total) by mouth daily. 08/21/21   Skeet Latch, MD  carvedilol (COREG) 3.125 MG tablet Take 1 tablet (3.125 mg total) by  mouth 2 (two) times daily with a meal. 08/21/21   Skeet Latch, MD  cetirizine (ZYRTEC ALLERGY) 10 MG tablet Take 1 tablet (10 mg total) by mouth daily. Patient taking differently: Take 10 mg by mouth daily as needed for allergies or rhinitis. 11/25/19   Hall-Potvin, [place], PA-C  FARXIGA 5 MG TABS tablet Take 5 mg by mouth daily. 05/17/21   [provider]  fluticasone (FLONASE) 50 MCG/ACT nasal spray Place 1 spray into both nostrils daily. Patient taking differently: Place 1 spray into both nostrils daily as needed for allergies or rhinitis. 11/25/19   Hall-Potvin, [place], PA-C  furosemide (LASIX) 20 MG tablet Take 1 tablet (20 mg total) by mouth daily. Patient not taking: Reported on 09/24/2021 05/13/21 08/21/21  Loel Dubonnet, NP  gabapentin (NEURONTIN) 300 MG capsule Take 300 mg by mouth 2 (two) times daily.    [provider]  glimepiride (AMARYL) 1 MG tablet Take 1 mg by mouth every morning. 05/30/21   [provider]  Multiple Vitamins-Minerals (CENTRUM SILVER 50+MEN) TABS Take 1 tablet by mouth daily with breakfast.    [provider]  nitroGLYCERIN (NITROSTAT) 0.4 MG SL tablet PLACE 1 TABLET (0.4 MG TOTAL) UNDER THE TONGUE EVERY FIVE MINUTES AS NEEDED FOR CHEST PAIN. Patient taking differently: Place 0.4 mg under the tongue every 5 (five) minutes as  needed for chest pain. 05/14/21 05/14/22  Loel Dubonnet, NP  oxyCODONE-acetaminophen (PERCOCET) 10-325 MG tablet Take 1 tablet by mouth every 6 (six) hours as needed for pain. 05/12/21   [provider]  pantoprazole (PROTONIX) 40 MG tablet Take 1 tablet (40 mg total) by mouth daily before breakfast. 08/21/21 02/17/22  Skeet Latch, MD  sacubitril-valsartan (ENTRESTO) 24-26 MG Take 1 tablet by mouth 2 (two) times daily. Patient not taking: Reported on 09/24/2021 01/17/20   Hosie Poisson, MD  spironolactone (ALDACTONE) 25 MG tablet Take 1 tablet (25 mg total) by mouth daily. 09/05/20   Skeet Latch, MD  tiZANidine (ZANAFLEX) 2 MG tablet Take 2 mg by mouth every 8 (eight) hours as needed for muscle spasms. 04/08/21   [provider]      Allergies    Patient has no known allergies.    Review of Systems   Review of Systems  Constitutional:  Negative for fever.  Cardiovascular:  Positive for chest pain.  Musculoskeletal:  Positive for back pain.  All other systems reviewed and are negative.   Physical Exam Updated Vital Signs BP 118/85 (BP Location: Right Arm)   Pulse 61   Temp 98.5 F (36.9 C) (Oral)   Resp 16   SpO2 93%  Physical Exam Vitals and nursing note reviewed.  Constitutional:      Appearance: He is well-developed. He is not ill-appearing.  HENT:     Head: Normocephalic and atraumatic.  Eyes:     Pupils: Pupils are equal, round, and reactive to light.  Cardiovascular:     Rate and Rhythm: Normal rate and regular rhythm.     Heart sounds: Normal heart sounds. No murmur heard. Pulmonary:     Effort: Pulmonary effort is normal. No respiratory distress.     Breath sounds: Normal breath sounds. No wheezing.  Abdominal:     General: Bowel sounds are normal.     Palpations: Abdomen is soft.     Tenderness: There is no abdominal tenderness. There is no rebound.  Musculoskeletal:     Cervical back: Neck supple.  Lymphadenopathy:     Cervical: No cervical adenopathy.  Skin:    General: Skin is warm and dry.  Neurological:     Mental Status: He is alert and oriented to person, place, and time.  Psychiatric:     Comments: Anxious     ED Results / Procedures / Treatments   Labs (all labs ordered are listed, but only abnormal results are displayed) Labs Reviewed  CBC WITH DIFFERENTIAL/PLATELET - Abnormal; Notable for the following components:      Result Value   Hemoglobin 11.8 (*)    HCT 38.9 (*)    MCV 77.8 (*)    MCH 23.6 (*)    All other components within normal limits  COMPREHENSIVE METABOLIC PANEL - Abnormal; Notable for the  following components:   Glucose, Bld 123 (*)    All other components within normal limits  TROPONIN I (HIGH SENSITIVITY)    EKG EKG Interpretation  Date/Time:  Sunday October 05 2021 02:07:13 EDT Ventricular Rate:  61 PR Interval:  192 QRS Duration: 162 QT Interval:  464 QTC Calculation: 467 R Axis:   -38 Text Interpretation: Normal sinus rhythm Left axis deviation Left bundle branch block Abnormal ECG When compared with ECG of 29-Sep-2021 21:38, PREVIOUS ECG IS PRESENT No significant change since last tracing Confirmed by Thayer Jew [phone]) on 10/05/2021 2:58:41 AM  Radiology DG Chest 2 View  Result Date: 10/05/2021 CLINICAL DATA:  Shortness of breath. EXAM: CHEST - 2 VIEW COMPARISON:  09/29/2021. FINDINGS: The heart size and mediastinal contours are within normal limits. Atherosclerotic calcification of the aorta is noted. No consolidation, effusion, or pneumothorax. Degenerative changes are present in the thoracic spine. IMPRESSION: No active cardiopulmonary disease. Electronically Signed   By: Brett Fairy M.D.   On: 10/05/2021 02:40    Procedures Procedures    Medications Ordered in ED Medications  ziprasidone (GEODON) 20 MG injection (20 mg  Given 10/05/21 0865)  sterile water (preservative free) injection (  Given 10/05/21 7846)    ED Course/ Medical Decision Making/ A&P Clinical Course as of 10/05/21 9629  Nancy Fetter Oct 05, 2021  0209 EKG 12-Lead [CH]  5284 Unfortunately, patient had progressively paranoid and aggressive behavior.  I attempted to redirect the patient but patient refused to sit down.  He became aggressive with nursing staff.  He required physical and chemical restraint with Geodon.  I highly suspect that he has paranoia and behavioral issues related to undiagnosed dementia.  He will need formal assessment and is not safe at this time to go home. [CH]    Clinical Course User Index [CH] Adolph Clutter, Barbette Hair, MD                           Medical Decision  Making Amount and/or Complexity of Data Reviewed Labs: ordered. Radiology: ordered. ECG/medicine tests:  Decision-making details documented in ED Course.   This patient presents to the ED for concern of pain, this involves an extensive number of treatment options, and is a complaint that carries with it a high risk of complications and morbidity.  I considered the following differential and admission for this acute, potentially life threatening condition.  The differential diagnosis includes ACS, pneumonia, pneumothorax, back injury  MDM:    This is an 81 year old male with recent significant visits to the emergency room who presents with pain all over.  He is nontoxic and vital signs are reassuring.  He appears to be at his baseline based on my to prior recent evaluations.  He is quite vague about his symptoms.  Highly suspect this is related to his ongoing anxiety about being at home alone and potentially early dementia.  I have attempted to contact his daughter as have nursing staff and we have been unsuccessful.  His EKG today is reassuring.  Troponin x1 is negative.  Do not feel he needs repeat troponin.  Lab work including CBC and CMP are at baseline.  I am concerned again about his actual capacity to make medical decisions on his own.  We will continue to attempt to discuss and contact family.  (Labs, imaging, consults)  Labs: I Ordered, and personally interpreted labs.  The pertinent results include: CBC, CMP, troponin  Imaging Studies ordered: I ordered imaging studies including chest x-ray I independently visualized and interpreted imaging. I agree with the radiologist interpretation  Additional history obtained from chart review.  External records from outside source obtained and reviewed including prior evaluations  Cardiac Monitoring: The patient was maintained on a cardiac monitor.  I personally viewed and interpreted the cardiac monitored which showed an underlying rhythm of:  Sinus rhythm  Reevaluation: After the interventions noted above, I reevaluated the patient and found that they have :stayed the same  Social Determinants of Health: Lives independently  Disposition: Pending  Co morbidities that complicate the patient evaluation  Past Medical History:  Diagnosis Date   Aortic atherosclerosis (HCC)    Back pain    Barrett esophagus    Bleeding nose    CAD in native artery 07/30/2020   DDD (degenerative disc disease), thoracolumbar    Moderate to severe   Diabetes mellitus without complication ([facility])    Diverticulosis    Esophageal cancer ([facility] [place]) dx'd 2017   Essential hypertension 07/30/2020   GERD (gastroesophageal reflux disease)    GERD (gastroesophageal reflux disease) 08/21/2021   Headache    Hearing loss    Right ear   History of umbilical hernia repair    Idiopathic peripheral neuropathy    Iron deficiency anemia    LBBB (left bundle branch block) 02/11/2016   Left hip pain    Severe degenerative changes   Lipoma of neck    Right   Mixed hyperlipidemia    PONV (postoperative nausea and vomiting)    Reflux    Right thyroid nodule    Sciatica 07/30/2020     Medicines Meds ordered this encounter  Medications   ziprasidone (GEODON) 20 MG injection    Lawrence Santiago M: cabinet override   sterile water (preservative free) injection    Lawrence Santiago M: cabinet override    I have reviewed the patients home medicines and have made adjustments as needed  Problem List / ED Course: Problem List Items Addressed This Visit   None Visit Diagnoses     Worried well    -  Primary                   Final Clinical Impression(s) / ED Diagnoses Final diagnoses:  Worried well    Rx / DC Orders ED Discharge Orders     None         Koran Seabrook, Barbette Hair, MD 10/05/21 6387    Merryl Hacker, MD 10/05/21 [phone]

## 2021-10-05 NOTE — ED Notes (Signed)
Unsuccessful attempts to reach pt's daughter at numbers listed in the chart.

## 2021-10-06 ENCOUNTER — Encounter: Payer: Self-pay | Admitting: Hematology

## 2021-10-06 DIAGNOSIS — R0602 Shortness of breath: Secondary | ICD-10-CM | POA: Diagnosis not present

## 2021-10-06 MED ORDER — LORAZEPAM 1 MG PO TABS: 1.0000 mg | ORAL_TABLET | ORAL | Status: DC | PRN

## 2021-10-06 MED ORDER — RISPERIDONE 1 MG PO TBDP
2.0000 mg | ORAL_TABLET | Freq: Three times a day (TID) | ORAL | Status: DC | PRN
  Filled 2021-10-06: qty 2

## 2021-10-06 MED ORDER — ZIPRASIDONE MESYLATE 20 MG IM SOLR
20.0000 mg | INTRAMUSCULAR | Status: AC | PRN
  Administered 2021-10-06: 20 mg via INTRAMUSCULAR
  Filled 2021-10-06: qty 20

## 2021-10-06 NOTE — ED Notes (Signed)
Pt refused VS. Pt wheeled out with wife.

## 2021-10-06 NOTE — ED Notes (Signed)
Pt consumed aprox 25% of lunch. Pt laid back in bed.

## 2021-10-06 NOTE — ED Notes (Signed)
Pt ambulated out of room and stated he was going home to sitter. Pt proceeded back into room and got dressed. Pt walked out of department. Assistance requested for pt safety. Tried to reorient pt back to ED. pt was uncooperative with staff. MD made aware.

## 2021-10-06 NOTE — ED Notes (Signed)
Pt in room resting. NAD chest rising and falling.  

## 2021-10-06 NOTE — Progress Notes (Signed)
CSW contacted patients friend Irish Lack who stated he is currently at work and cannot pick-up patient. CSW was given Mayer Camel another friend [phone] who could possibly help. CSW spoke with Trilby Drummer who stated he was out of town and could not pick-up patient. CSW told Trilby Drummer that CSW explained to patients daughter Caryl Pina that patient could not continue to live on his own and that someone would need to check on him frequently and provide care. Trilby Drummer gave [place], [phone] and Regan Lemming [phone] to see if they can pick up patient. Trilby Drummer also stated he would work on someone taking patient home and provide care for him. CSW spoke with Marcelino Scot who stated he will call someone to come to the ED and pick-up patient. Marcelino Scot stated he thought patient was supposed to go to [place]. CSW stated she is not sure what happened with that.

## 2021-10-06 NOTE — Progress Notes (Signed)
CSW contacted patient daughter Jaizon Deroos and asked for other family contacts to discuss a plan for her father. Caryl Pina stated she does not drive and also refused to give CSW any numbers or names of patients other children and family in [place]. Caryl Pina kept saying they are not going to come get him. CSW stated regardless she needs to be able to have their contact information. Caryl Pina stated that CSW should only be contacting her in regards to her fathers care. CSW explained that its not appropriate for her father to continue to live on his own and that he would need to stay at her house. Caryl Pina stated no that her father could not stay with her. CSW told Caryl Pina it is not safe for her father to be by himself. CSW stated again this is why she needs other family contacts to discuss a plan. CSW explained that her father needs to follow-up with his primary doctor and a neurologist to get a formal diagnosis of dementia. Caryl Pina told CSW that the hospital is supposed to do that. CSW explained that the ED does not do that and that has to be done outside of the ED. CSW stated for patient to get placement for memory care that her father would need a formal diagnosis. Caryl Pina stated she is aware of all this and was the one who told the doctor her father has dementia. CSW asked Caryl Pina again what is her plan and who is coming to pick up her father. Caryl Pina told CSW to call patients friend Eduard Clos. CSW explained to Caryl Pina that her father does not have his phone. Caryl Pina stated oh well I've been trying to call him. Caryl Pina states she does not know [name]'s number or anyone else who can pick-up her father. Caryl Pina told CSW to give her a hour and she would call CSW back.

## 2021-10-06 NOTE — Progress Notes (Signed)
CSW spoke with Juan [name] who is patients wife. Ms. Juan [name] stated they are legally separated but still married, [phone]. CSW explained that she was trying to find someone to take [name] home and provide care for him. CSW told Juan [name] that she does not think its appropriate for [name] to be at his home by himself. Juan [name] stated she agrees. Juan [name] stated [name] has a neurology appointment tomorrow 10/07/21 at 10 AM. Juan [name] stated she will see if her brother in law can take [name] to his appointment. Juan [name] stated she will also work on someone coming to get [name]. CSW told Juan [name] that [name] will need a formal diagnosis of dementia and medicaid to be considered for memory care placemen. Juan [name] stated that's no problem and she can go to social services tomorrow and apply. Juan [name] stated someone should be there to get [name] around 1 PM or a little after 1 PM today.     CSW also reached out to [employer] and left a message with Juan [name] to see if they can assist family with applying for medicaid. CSW is awaiting a call back. CSW contacted [place] with [employer] who will reach out to patients wife to see if they can assist.

## 2021-10-06 NOTE — ED Notes (Signed)
Pt ambulated to restroom with a steady gait and ate breakfast. Pt calm in room watching tv. Safety sitter at bedside.

## 2021-10-06 NOTE — ED Notes (Signed)
Juan [name] located. [phone].

## 2021-10-06 NOTE — ED Provider Notes (Signed)
Emergency Medicine Observation Re-evaluation Note  Juan [name] is a 81 y.o. male, seen on rounds today.  Pt initially presented to the ED for complaints of Shortness of Breath and Back Pain Currently, the patient is sitting on bed.  Physical Exam  BP 118/72   Pulse 60   Temp 97.9 F (36.6 C) (Oral)   Resp 18   SpO2 98%  Physical Exam General: Awake, alert, calm Cardiac: Extremities well-perfused Lungs: Breathing is unlabored Psych: No current agitation  ED Course / MDM  EKG:EKG Interpretation  Date/Time:  Sunday October 05 2021 02:07:13 EDT Ventricular Rate:  61 PR Interval:  192 QRS Duration: 162 QT Interval:  464 QTC Calculation: 467 R Axis:   -38 Text Interpretation: Normal sinus rhythm Left axis deviation Left bundle branch block Abnormal ECG When compared with ECG of 29-Sep-2021 21:38, PREVIOUS ECG IS PRESENT No significant change since last tracing Confirmed by Thayer Jew [phone]) on 10/05/2021 2:58:41 AM  I have reviewed the labs performed to date as well as medications administered while in observation.  Recent changes in the last 24 hours include ongoing efforts with social work to work with daughter in order to get him back home.  Patient was attempting to leave the ED this morning.  He was redirected back to his bed.  As needed agitation meds were ordered.  Plan  Current plan is for social work disposition.  Juan [name] is not under involuntary commitment.     Godfrey Pick, MD 10/06/21 1004

## 2021-10-06 NOTE — ED Notes (Signed)
Pt back in room after speaking with charge RN and security in the hall. Attempted to contact family. No response. SW at the bedside. Pt refused to take morning meds.

## 2021-10-06 NOTE — ED Notes (Signed)
Breakfast order placed ?

## 2021-10-07 ENCOUNTER — Other Ambulatory Visit: Payer: Medicare HMO

## 2021-10-07 ENCOUNTER — Encounter (HOSPITAL_COMMUNITY): Payer: Self-pay | Admitting: Emergency Medicine

## 2021-10-07 ENCOUNTER — Ambulatory Visit: Payer: Medicare HMO | Admitting: Physician Assistant

## 2021-10-07 ENCOUNTER — Encounter: Payer: Self-pay | Admitting: Physician Assistant

## 2021-10-07 VITALS — BP 168/96 | HR 65 | Resp 18 | Ht 73.0 in | Wt 192.0 lb

## 2021-10-07 DIAGNOSIS — R413 Other amnesia: Secondary | ICD-10-CM

## 2021-10-07 DIAGNOSIS — F1097 Alcohol use, unspecified with alcohol-induced persisting dementia: Secondary | ICD-10-CM | POA: Diagnosis not present

## 2021-10-07 LAB — VITAMIN B12: Vitamin B-12: 222 pg/mL (ref 211–911)

## 2021-10-07 LAB — TSH: TSH: 0.82 u[IU]/mL (ref 0.35–5.50)

## 2021-10-07 MED ORDER — MEMANTINE HCL 10 MG PO TABS: ORAL_TABLET | ORAL | 11 refills | Status: DC

## 2021-10-07 NOTE — Progress Notes (Signed)
Please let patient know that needs to take B12 supplements daily (over the counter ) to maintain god B12 levels. His thyroid levels are normal thanks

## 2021-10-07 NOTE — Patient Instructions (Addendum)
It was a pleasure to see you today at our office.   Recommendations:  Follow up in  1 month  Start Memantine 10 mg: Take 1 tablet (10 mg at night) for 2 weeks, then increase to 1 tablet (10 mg) twice a day.    Make sure he is not alone, needs someone to manage the medications   Whom to call:  Memory  decline, memory medications: Call our office [phone]   For psychiatric meds, mood meds: Please have your primary care physician manage these medications.      For assessment of decision of mental capacity and competency:  Call Dr. Anthoney Harada, geriatric psychiatrist at [phone]    For guidance regarding [facility] and if placement were needed at the facility, contact Arnell Asal, Social Worker tel: [phone]  If you have any severe symptoms of a stroke, or other severe issues such as confusion, suicidal ideation severe chills or fever, etc call 911 or go to the ER as you may need to be evaluated further       RECOMMENDATIONS FOR ALL PATIENTS WITH MEMORY PROBLEMS: 1. Continue to exercise (Recommend 30 minutes of walking everyday, or 3 hours every week) 2. Increase social interactions - continue going to [place] and enjoy social gatherings with friends and family 3. Eat healthy, avoid fried foods and eat more fruits and vegetables 4. Maintain adequate blood pressure, blood sugar, and blood cholesterol level. Reducing the risk of stroke and cardiovascular disease also helps promoting better memory. 5. Avoid stressful situations. Live a simple life and avoid aggravations. Organize your time and prepare for the next day in anticipation. 6. Sleep well, avoid any interruptions of sleep and avoid any distractions in the bedroom that may interfere with adequate sleep quality 7. Avoid sugar, avoid sweets as there is a strong link between excessive sugar intake, diabetes, and cognitive impairment We discussed the Mediterranean diet, which has been shown to  help patients reduce the risk of progressive memory disorders and reduces cardiovascular risk. This includes eating fish, eat fruits and green leafy vegetables, nuts like almonds and hazelnuts, walnuts, and also use olive oil. Avoid fast foods and fried foods as much as possible. Avoid sweets and sugar as sugar use has been linked to worsening of memory function.  There is always a concern of gradual progression of memory problems. If this is the case, then we may need to adjust level of care according to patient needs. Support, both to the patient and caregiver, should then be put into place.    The Alzheimer's Association is here all day, every day for people facing Alzheimer's disease through our free 24/7 Helpline: 815 689 2407. The Helpline provides reliable information and support to all those who need assistance, such as individuals living with memory loss, Alzheimer's or other dementia, caregivers, health care professionals and the public.  Our highly trained and knowledgeable staff can help you with: Understanding memory loss, dementia and Alzheimer's  Medications and other treatment options  General information about aging and brain health  Skills to provide quality care and to find the best care from professionals  Legal, financial and living-arrangement decisions Our Helpline also features: Confidential care consultation provided by master's level clinicians who can help with decision-making support, crisis assistance and education on issues families face every day  Help in a caller's preferred language using our translation service that features more than 200 languages and dialects  Referrals to local community programs, services and ongoing support  FALL PRECAUTIONS: Be cautious when walking. Scan the area for obstacles that may increase the risk of trips and falls. When getting up in the mornings, sit up at the edge of the bed for a few minutes before getting out of bed.  Consider elevating the bed at the head end to avoid drop of blood pressure when getting up. Walk always in a well-lit room (use night lights in the walls). Avoid area rugs or power cords from appliances in the middle of the walkways. Use a walker or a cane if necessary and consider physical therapy for balance exercise. Get your eyesight checked regularly.  FINANCIAL OVERSIGHT: Supervision, especially oversight when making financial decisions or transactions is also recommended.  HOME SAFETY: Consider the safety of the kitchen when operating appliances like stoves, microwave oven, and blender. Consider having supervision and share cooking responsibilities until no longer able to participate in those. Accidents with firearms and other hazards in the house should be identified and addressed as well.   ABILITY TO BE LEFT ALONE: If patient is unable to contact 911 operator, consider using LifeLine, or when the need is there, arrange for someone to stay with patients. Smoking is a fire hazard, consider supervision or cessation. Risk of wandering should be assessed by caregiver and if detected at any point, supervision and safe proof recommendations should be instituted.  MEDICATION SUPERVISION: Inability to self-administer medication needs to be constantly addressed. Implement a mechanism to ensure safe administration of the medications.        Mediterranean Diet A Mediterranean diet refers to food and lifestyle choices that are based on the traditions of countries located on the [employer]. This way of eating has been shown to help prevent certain conditions and improve outcomes for people who have chronic diseases, like kidney disease and heart disease. What are tips for following this plan? Lifestyle  Cook and eat meals together with your family, when possible. Drink enough fluid to keep your urine clear or pale yellow. Be physically active every day. This includes: Aerobic exercise like  running or swimming. Leisure activities like gardening, walking, or housework. Get 7-8 hours of sleep each night. If recommended by your health care provider, drink red wine in moderation. This means 1 glass a day for nonpregnant women and 2 glasses a day for men. A glass of wine equals 5 oz (150 mL). Reading food labels  Check the serving size of packaged foods. For foods such as rice and pasta, the serving size refers to the amount of cooked product, not dry. Check the total fat in packaged foods. Avoid foods that have saturated fat or trans fats. Check the ingredients list for added sugars, such as corn syrup. Shopping  At the grocery store, buy most of your food from the areas near the walls of the store. This includes: Fresh fruits and vegetables (produce). Grains, beans, nuts, and seeds. Some of these may be available in unpackaged forms or large amounts (in bulk). Fresh seafood. Poultry and eggs. Low-fat dairy products. Buy whole ingredients instead of prepackaged foods. Buy fresh fruits and vegetables in-season from local farmers markets. Buy frozen fruits and vegetables in resealable bags. If you do not have access to quality fresh seafood, buy precooked frozen shrimp or canned fish, such as tuna, salmon, or sardines. Buy small amounts of raw or cooked vegetables, salads, or olives from the deli or salad bar at your store. Stock your pantry so you always have certain foods on hand, such as  olive oil, canned tuna, canned tomatoes, rice, pasta, and beans. Cooking  Cook foods with extra-virgin olive oil instead of using butter or other vegetable oils. Have meat as a side dish, and have vegetables or grains as your main dish. This means having meat in small portions or adding small amounts of meat to foods like pasta or stew. Use beans or vegetables instead of meat in common dishes like chili or lasagna. Experiment with different cooking methods. Try roasting or broiling vegetables  instead of steaming or sauteing them. Add frozen vegetables to soups, stews, pasta, or rice. Add nuts or seeds for added healthy fat at each meal. You can add these to yogurt, salads, or vegetable dishes. Marinate fish or vegetables using olive oil, lemon juice, garlic, and fresh herbs. Meal planning  Plan to eat 1 vegetarian meal one day each week. Try to work up to 2 vegetarian meals, if possible. Eat seafood 2 or more times a week. Have healthy snacks readily available, such as: Vegetable sticks with hummus. Greek yogurt. Fruit and nut trail mix. Eat balanced meals throughout the week. This includes: Fruit: 2-3 servings a day Vegetables: 4-5 servings a day Low-fat dairy: 2 servings a day Fish, poultry, or lean meat: 1 serving a day Beans and legumes: 2 or more servings a week Nuts and seeds: 1-2 servings a day Whole grains: 6-8 servings a day Extra-virgin olive oil: 3-4 servings a day Limit red meat and sweets to only a few servings a month What are my food choices? Mediterranean diet Recommended Grains: Whole-grain pasta. Brown rice. Bulgar wheat. Polenta. Couscous. Whole-wheat bread. [place]. Vegetables: Artichokes. Beets. Broccoli. Cabbage. Carrots. Eggplant. Green beans. Chard. Kale. Spinach. Onions. Leeks. Peas. Squash. Tomatoes. Peppers. Radishes. Fruits: Apples. Apricots. Avocado. Berries. Bananas. Cherries. Dates. Figs. Grapes. Lemons. Melon. Oranges. Peaches. Plums. Pomegranate. Meats and other protein foods: Beans. Almonds. Sunflower seeds. Pine nuts. Peanuts. [place]. Salmon. Scallops. Shrimp. [place]. Tilapia. Clams. Oysters. Eggs. Dairy: Low-fat milk. Cheese. Greek yogurt. Beverages: Water. Red wine. Herbal tea. Fats and oils: Extra virgin olive oil. Avocado oil. Grape seed oil. Sweets and desserts: [place] yogurt with honey. Baked apples. Poached pears. Trail mix. Seasoning and other foods: Basil. Cilantro. Coriander. Cumin. Mint. Parsley. Sage. Rosemary. Tarragon.  Garlic. Oregano. Thyme. Pepper. Balsalmic vinegar. Tahini. Hummus. Tomato sauce. Olives. Mushrooms. Limit these Grains: Prepackaged pasta or rice dishes. Prepackaged cereal with added sugar. Vegetables: Deep fried potatoes (french fries). Fruits: Fruit canned in syrup. Meats and other protein foods: Beef. Pork. Lamb. Poultry with skin. Hot dogs. Berniece Salines. Dairy: Ice cream. Sour cream. Whole milk. Beverages: Juice. Sugar-sweetened soft drinks. Beer. Liquor and spirits. Fats and oils: Butter. Canola oil. Vegetable oil. Beef fat (tallow). Lard. Sweets and desserts: Cookies. Cakes. Pies. Candy. Seasoning and other foods: Mayonnaise. Premade sauces and marinades. The items listed may not be a complete list. Talk with your dietitian about what dietary choices are right for you. Summary The Mediterranean diet includes both food and lifestyle choices. Eat a variety of fresh fruits and vegetables, beans, nuts, seeds, and whole grains. Limit the amount of red meat and sweets that you eat. Talk with your health care provider about whether it is safe for you to drink red wine in moderation. This means 1 glass a day for nonpregnant women and 2 glasses a day for men. A glass of wine equals 5 oz (150 mL). This information is not intended to replace advice given to you by your health care provider. Make sure you discuss any  questions you have with your health care provider. Document Released: 11/07/2015 Document Revised: 12/10/2015 Document Reviewed: 11/07/2015 Elsevier Interactive Patient Education  2017 [employer].   We have sent a referral to [facility] for your MRI and they will call you directly to schedule your appointment. They are located at [place]. If you need to contact them directly please call [phone].   Your provider has requested that you have labwork completed today. Please go to [facility] ([phone]) on the second floor of this building before leaving the  office today. You do not need to check in. If you are not called within 15 minutes please check with the front desk.
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
## 2021-10-14 ENCOUNTER — Encounter (HOSPITAL_BASED_OUTPATIENT_CLINIC_OR_DEPARTMENT_OTHER): Payer: Self-pay | Admitting: *Deleted

## 2021-10-15 ENCOUNTER — Telehealth: Payer: Self-pay | Admitting: Physician Assistant
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
## 2021-10-15 NOTE — Telephone Encounter (Signed)
Unable to leave message at 344 10/15/2021

## 2021-10-15 NOTE — Telephone Encounter (Signed)
Patients wife called and asked if Alyse Low would give her a call back.

## 2021-10-16 ENCOUNTER — Other Ambulatory Visit: Payer: Self-pay

## 2021-10-16 ENCOUNTER — Encounter (HOSPITAL_COMMUNITY): Payer: Self-pay | Admitting: *Deleted

## 2021-10-16 ENCOUNTER — Emergency Department (HOSPITAL_COMMUNITY)
Admission: EM | Admit: 2021-10-16 | Discharge: 2021-10-16 | Discharge status: Against Medical Advice | Payer: Medicare HMO | Attending: Emergency Medicine | Admitting: Emergency Medicine

## 2021-10-16 ENCOUNTER — Emergency Department (HOSPITAL_COMMUNITY): Payer: Medicare HMO

## 2021-10-16 ENCOUNTER — Emergency Department (HOSPITAL_COMMUNITY)
Admission: EM | Admit: 2021-10-16 | Discharge: 2021-10-22 | Disposition: A | Discharge status: Home / Self Care | Payer: Medicare HMO | Attending: Emergency Medicine | Admitting: Emergency Medicine

## 2021-10-16 DIAGNOSIS — G8929 Other chronic pain: Secondary | ICD-10-CM | POA: Insufficient documentation

## 2021-10-16 DIAGNOSIS — M545 Low back pain, unspecified: Secondary | ICD-10-CM | POA: Diagnosis not present

## 2021-10-16 DIAGNOSIS — R451 Restlessness and agitation: Secondary | ICD-10-CM | POA: Diagnosis present

## 2021-10-16 DIAGNOSIS — F419 Anxiety disorder, unspecified: Secondary | ICD-10-CM | POA: Insufficient documentation

## 2021-10-16 DIAGNOSIS — Z5321 Procedure and treatment not carried out due to patient leaving prior to being seen by health care provider: Secondary | ICD-10-CM | POA: Insufficient documentation

## 2021-10-16 DIAGNOSIS — F0390 Unspecified dementia without behavioral disturbance: Secondary | ICD-10-CM | POA: Diagnosis not present

## 2021-10-16 DIAGNOSIS — Z7982 Long term (current) use of aspirin: Secondary | ICD-10-CM | POA: Diagnosis not present

## 2021-10-16 DIAGNOSIS — R45851 Suicidal ideations: Secondary | ICD-10-CM | POA: Insufficient documentation

## 2021-10-16 DIAGNOSIS — Z0279 Encounter for issue of other medical certificate: Secondary | ICD-10-CM | POA: Diagnosis not present

## 2021-10-16 LAB — CBC WITH DIFFERENTIAL/PLATELET
Abs Immature Granulocytes: 0.01 10*3/uL (ref 0.00–0.07)
Basophils Absolute: 0 10*3/uL (ref 0.0–0.1)
Basophils Relative: 0 %
Eosinophils Absolute: 0.1 10*3/uL (ref 0.0–0.5)
Eosinophils Relative: 2 %
HCT: 37.9 % — ABNORMAL LOW (ref 39.0–52.0)
Hemoglobin: 11.7 g/dL — ABNORMAL LOW (ref 13.0–17.0)
Immature Granulocytes: 0 %
Lymphocytes Relative: 20 %
Lymphs Abs: 1 10*3/uL (ref 0.7–4.0)
MCH: 23.5 pg — ABNORMAL LOW (ref 26.0–34.0)
MCHC: 30.9 g/dL (ref 30.0–36.0)
MCV: 76.3 fL — ABNORMAL LOW (ref 80.0–100.0)
Monocytes Absolute: 0.4 10*3/uL (ref 0.1–1.0)
Monocytes Relative: 9 %
Neutro Abs: 3.4 10*3/uL (ref 1.7–7.7)
Neutrophils Relative %: 69 %
Platelets: 259 10*3/uL (ref 150–400)
RBC: 4.97 MIL/uL (ref 4.22–5.81)
RDW: 15.4 % (ref 11.5–15.5)
WBC: 5 10*3/uL (ref 4.0–10.5)
nRBC: 0 % (ref 0.0–0.2)

## 2021-10-16 LAB — SALICYLATE LEVEL: Salicylate Lvl: <7 mg/dL — ABNORMAL LOW (ref 7.0–30.0)

## 2021-10-16 LAB — BASIC METABOLIC PANEL
Anion gap: 6 (ref 5–15)
BUN: 6 mg/dL — ABNORMAL LOW (ref 8–23)
CO2: 25 mmol/L (ref 22–32)
Calcium: 8.8 mg/dL — ABNORMAL LOW (ref 8.9–10.3)
Chloride: 109 mmol/L (ref 98–111)
Creatinine, Ser: 0.97 mg/dL (ref 0.61–1.24)
GFR, Estimated: >60 mL/min (ref >60)
Glucose, Bld: 104 mg/dL — ABNORMAL HIGH (ref 70–99)
Potassium: 3.6 mmol/L (ref 3.5–5.1)
Sodium: 140 mmol/L (ref 135–145)

## 2021-10-16 LAB — URINALYSIS, ROUTINE W REFLEX MICROSCOPIC
Bacteria, UA: NONE SEEN
Bilirubin Urine: NEGATIVE
Glucose, UA: NEGATIVE mg/dL
Ketones, ur: NEGATIVE mg/dL
Leukocytes,Ua: NEGATIVE
Nitrite: NEGATIVE
Protein, ur: NEGATIVE mg/dL
Specific Gravity, Urine: 1.018 (ref 1.005–1.030)
pH: 5 (ref 5.0–8.0)

## 2021-10-16 LAB — ETHANOL: Alcohol, Ethyl (B): <10 mg/dL (ref <10)

## 2021-10-16 LAB — ACETAMINOPHEN LEVEL: Acetaminophen (Tylenol), Serum: <10 ug/mL — ABNORMAL LOW (ref 10–30)

## 2021-10-16 MED ORDER — CARVEDILOL 3.125 MG PO TABS
3.1250 mg | ORAL_TABLET | Freq: Two times a day (BID) | ORAL | Status: DC
  Administered 2021-10-17 – 2021-10-22 (×7): 3.125 mg via ORAL
  Filled 2021-10-16 (×10): qty 1

## 2021-10-16 MED ORDER — GABAPENTIN 300 MG PO CAPS
300.0000 mg | ORAL_CAPSULE | Freq: Two times a day (BID) | ORAL | Status: DC
  Administered 2021-10-16 – 2021-10-22 (×10): 300 mg via ORAL
  Filled 2021-10-16 (×12): qty 1

## 2021-10-16 MED ORDER — DAPAGLIFLOZIN PROPANEDIOL 5 MG PO TABS
5.0000 mg | ORAL_TABLET | Freq: Every day | ORAL | Status: DC
  Administered 2021-10-17 – 2021-10-22 (×5): 5 mg via ORAL
  Filled 2021-10-16 (×6): qty 1

## 2021-10-16 MED ORDER — ATORVASTATIN CALCIUM 40 MG PO TABS
40.0000 mg | ORAL_TABLET | Freq: Every day | ORAL | Status: DC
  Administered 2021-10-17 – 2021-10-22 (×6): 40 mg via ORAL
  Filled 2021-10-16 (×6): qty 1

## 2021-10-16 MED ORDER — GLIMEPIRIDE 1 MG PO TABS
1.0000 mg | ORAL_TABLET | Freq: Every morning | ORAL | Status: DC
  Administered 2021-10-17 – 2021-10-22 (×5): 1 mg via ORAL
  Filled 2021-10-16 (×6): qty 1

## 2021-10-16 MED ORDER — ASPIRIN 81 MG PO TBEC
81.0000 mg | DELAYED_RELEASE_TABLET | Freq: Every day | ORAL | Status: DC
  Administered 2021-10-17 – 2021-10-22 (×6): 81 mg via ORAL
  Filled 2021-10-16 (×6): qty 1

## 2021-10-16 MED ORDER — ACETAMINOPHEN 500 MG PO TABS
1000.0000 mg | ORAL_TABLET | Freq: Once | ORAL | Status: DC
  Filled 2021-10-16: qty 2

## 2021-10-16 MED ORDER — ALBUTEROL SULFATE (2.5 MG/3ML) 0.083% IN NEBU: 2.5000 mg | INHALATION_SOLUTION | Freq: Four times a day (QID) | RESPIRATORY_TRACT | Status: DC | PRN

## 2021-10-16 MED ORDER — PANTOPRAZOLE SODIUM 40 MG PO TBEC
40.0000 mg | DELAYED_RELEASE_TABLET | Freq: Every day | ORAL | Status: DC
  Administered 2021-10-17 – 2021-10-22 (×6): 40 mg via ORAL
  Filled 2021-10-16 (×6): qty 1

## 2021-10-16 MED ORDER — ALBUTEROL SULFATE HFA 108 (90 BASE) MCG/ACT IN AERS: 2.0000 | INHALATION_SPRAY | Freq: Four times a day (QID) | RESPIRATORY_TRACT | Status: DC | PRN

## 2021-10-16 NOTE — ED Triage Notes (Signed)
Patient present to ed c/o back pain denies injury onset 1 week ago , states he wasn't able to lay down to sleep

## 2021-10-16 NOTE — BH Assessment (Addendum)
@  1614, requested patient's nurse Jinny Blossom, RN) to place the TTS machine in patient's room for his initial TTS assessment.   Per nursing Jinny Blossom, RN)  patient in hallway. Nursing will find a room to set up the TTS assessment and notify TTS when patient is ready to be seen.

## 2021-10-16 NOTE — ED Provider Notes (Signed)
[facility] EMERGENCY DEPARTMENT Provider Note   CSN: [id number] Arrival date & time: 10/16/21  1531     History  Chief Complaint  Patient presents with   IVC   Medical Clearance    Juan [name] is a 81 y.o. male.  81 year old male with prior medical history as detailed below presents with Librarian, academic.  Patient with IVC completed by patient's estranged wife.  IVC reports the patient with "history of mental illness, the patient has not been sleeping or attending to his personal hygiene.  The respondent hallucinates.  He sees people from the other side.  Patient is suicidal.  He attempted to pull a moving car door open.  He jumped in front of a moving car.  He reportedly said run over me or shoot me I want to die.  The patient lives alone in an apartment.  Almost every day the patient will go outside and threaten other members of the compartment complex.  He will frequently state that he wants to die."  The patient is pleasant on initial evaluation.  He is alert to person and place.  He reports that the year is 2023.  He cannot report the correct date otherwise.  His primary complaint is of a low back pain.  This pain appears be a chronic issue.  He reports that he has "diminished capacity."  Patient with recent evaluation by both social work and neurology.  Patient with dementia.  Patient has not appropriately cared for when living by himself.  Patient's estranged wife is concerned about his safety when alone.  Recent neurologic and social work evaluation indicates that patient will require placement in memory care unit.  Neuro evaluation did request MRI of brain.  The history is provided by the patient and medical records.       Home Medications Prior to Admission medications   Medication Sig Start Date End Date Taking? Authorizing Provider  albuterol (VENTOLIN HFA) 108 (90 Base) MCG/ACT inhaler Inhale 2 puffs into the lungs every 6 (six) hours as needed  for wheezing or shortness of breath.  06/14/19   [provider]  amitriptyline (ELAVIL) 10 MG tablet Take 10 mg by mouth at bedtime. 03/11/18   [provider]  aspirin EC 81 MG tablet Take 81 mg by mouth daily.    [provider]  atorvastatin (LIPITOR) 40 MG tablet Take 1 tablet (40 mg total) by mouth daily. 08/21/21   Skeet Latch, MD  carvedilol (COREG) 3.125 MG tablet Take 1 tablet (3.125 mg total) by mouth 2 (two) times daily with a meal. 08/21/21   Skeet Latch, MD  cetirizine (ZYRTEC ALLERGY) 10 MG tablet Take 1 tablet (10 mg total) by mouth daily. Patient taking differently: Take 10 mg by mouth daily as needed for allergies or rhinitis. 11/25/19   Hall-Potvin, [place], PA-C  FARXIGA 5 MG TABS tablet Take 5 mg by mouth daily. 05/17/21   [provider]  fluticasone (FLONASE) 50 MCG/ACT nasal spray Place 1 spray into both nostrils daily. Patient taking differently: Place 1 spray into both nostrils daily as needed for allergies or rhinitis. 11/25/19   Hall-Potvin, [place], PA-C  furosemide (LASIX) 20 MG tablet Take 1 tablet (20 mg total) by mouth daily. Patient not taking: Reported on 09/24/2021 05/13/21 08/21/21  Loel Dubonnet, NP  gabapentin (NEURONTIN) 300 MG capsule Take 300 mg by mouth 2 (two) times daily.    [provider]  glimepiride (AMARYL) 1 MG tablet Take 1  mg by mouth every morning. 05/30/21   [provider]  memantine (NAMENDA) 10 MG tablet Take 1 tablet (10 mg at night) for 2 weeks, then increase to 1 tablet (10 mg) twice a day 10/07/21   Rondel Jumbo, PA-C  Multiple Vitamins-Minerals (CENTRUM SILVER 50+MEN) TABS Take 1 tablet by mouth daily with breakfast.    [provider]  nitroGLYCERIN (NITROSTAT) 0.4 MG SL tablet PLACE 1 TABLET (0.4 MG TOTAL) UNDER THE TONGUE EVERY FIVE MINUTES AS NEEDED FOR CHEST PAIN. Patient not taking: Reported on 10/07/2021 05/14/21 05/14/22  Loel Dubonnet, NP   oxyCODONE-acetaminophen (PERCOCET) 10-325 MG tablet Take 1 tablet by mouth every 6 (six) hours as needed for pain. Patient not taking: Reported on 10/07/2021 05/12/21   [provider]  pantoprazole (PROTONIX) 40 MG tablet Take 1 tablet (40 mg total) by mouth daily before breakfast. 08/21/21 02/17/22  Skeet Latch, MD  sacubitril-valsartan (ENTRESTO) 24-26 MG Take 1 tablet by mouth 2 (two) times daily. 01/17/20   Hosie Poisson, MD  spironolactone (ALDACTONE) 25 MG tablet Take 1 tablet (25 mg total) by mouth daily. 09/05/20   Skeet Latch, MD  tiZANidine (ZANAFLEX) 2 MG tablet Take 2 mg by mouth every 8 (eight) hours as needed for muscle spasms. 04/08/21   [provider]      Allergies    Patient has no known allergies.    Review of Systems   Review of Systems  All other systems reviewed and are negative.   Physical Exam Updated Vital Signs BP 137/77 (BP Location: Right Arm)   Pulse 64   Temp 97.8 F (36.6 C) (Oral)   Resp 15   SpO2 94%  Physical Exam Vitals and nursing note reviewed.  Constitutional:      General: He is not in acute distress.    Appearance: Normal appearance. He is well-developed.  HENT:     Head: Normocephalic and atraumatic.  Eyes:     Conjunctiva/sclera: Conjunctivae normal.     Pupils: Pupils are equal, round, and reactive to light.  Cardiovascular:     Rate and Rhythm: Normal rate and regular rhythm.     Heart sounds: Normal heart sounds.  Pulmonary:     Effort: Pulmonary effort is normal. No respiratory distress.     Breath sounds: Normal breath sounds.  Abdominal:     General: There is no distension.     Palpations: Abdomen is soft.     Tenderness: There is no abdominal tenderness.  Musculoskeletal:        General: No deformity. Normal range of motion.     Cervical back: Normal range of motion and neck supple.  Skin:    General: Skin is warm and dry.  Neurological:     General: No focal deficit present.     Mental  Status: He is alert.     Comments: Pleasant, alert, oriented to person, place, and year.  Normal speech, no focal weakness.     ED Results / Procedures / Treatments   Labs (all labs ordered are listed, but only abnormal results are displayed) Labs Reviewed  CBC WITH DIFFERENTIAL/PLATELET - Abnormal; Notable for the following components:      Result Value   Hemoglobin 11.7 (*)    HCT 37.9 (*)    MCV 76.3 (*)    MCH 23.5 (*)    All other components within normal limits  BASIC METABOLIC PANEL - Abnormal; Notable for the following components:   Glucose, Bld 104 (*)  BUN 6 (*)    Calcium 8.8 (*)    All other components within normal limits  ACETAMINOPHEN LEVEL - Abnormal; Notable for the following components:   Acetaminophen (Tylenol), Serum <10 (*)    All other components within normal limits  SALICYLATE LEVEL - Abnormal; Notable for the following components:   Salicylate Lvl <2.2 (*)    All other components within normal limits  ETHANOL  URINALYSIS, ROUTINE W REFLEX MICROSCOPIC  RAPID URINE DRUG SCREEN, HOSP PERFORMED    EKG None  Radiology MR BRAIN WO CONTRAST  Result Date: 10/16/2021 CLINICAL DATA:  Delirium EXAM: MRI HEAD WITHOUT CONTRAST TECHNIQUE: Multiplanar, multiecho pulse sequences of the brain and surrounding structures were obtained without intravenous contrast. COMPARISON:  None Available. FINDINGS: Brain: No acute infarct, mass effect or extra-axial collection. No acute or chronic hemorrhage. There is multifocal hyperintense T2-weighted signal within the white matter. Generalized volume loss. There are old subcortical infarcts of the left temporal and frontal lobes. The midline structures are normal. Vascular: Major flow voids are preserved. Skull and upper cervical spine: Normal calvarium and skull base. Visualized upper cervical spine and soft tissues are normal. Sinuses/Orbits:No paranasal sinus fluid levels or advanced mucosal thickening. No mastoid or middle  ear effusion. Normal orbits. IMPRESSION: 1. No acute intracranial abnormality. 2. Generalized volume loss and findings of chronic ischemic microangiopathy. 3. 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 Electronically Signed   By: Ulyses Jarred M.D.   On: 10/16/2021 19:11   DG Lumbar Spine Complete  Result Date: 10/16/2021 CLINICAL DATA:  Pain EXAM: LUMBAR SPINE - COMPLETE 4+ VIEW COMPARISON:  01/13/2021, 09/19/2021 FINDINGS: Frontal, bilateral oblique, lateral views of the lumbar spine are obtained. There are 5 non-rib-bearing lumbar type vertebral bodies in normal anatomic alignment. Chronic compression deformity superior endplate L3 vertebral body. There is diffuse multilevel spondylosis and facet hypertrophy again noted, with bony fusion across the L5-S1 disc space unchanged. No acute displaced fracture. Sacroiliac joints are unremarkable. IMPRESSION: 1. Extensive multilevel spondylosis and facet hypertrophy. 2. No acute bony abnormality. 3. Stable compression deformity superior endplate L3 vertebral body, unchanged since prior CT. Electronically Signed   By: Randa Ngo M.D.   On: 10/16/2021 16:29    Procedures Procedures    Medications Ordered in ED Medications - No data to display  ED Course/ Medical Decision Making/ A&P                           Medical Decision Making Amount and/or Complexity of Data Reviewed Labs: ordered. Radiology: ordered.  Risk OTC drugs.    Medical Screen Complete  This patient presented to the ED with complaint of IVC hold.  This complaint involves an extensive number of treatment options. The initial differential diagnosis includes, but is not limited to, suicidal ideation, dementia, unsafe behaviors, metabolic abnormality, etc.  This presentation is: Acute, Chronic, Self-Limited, Previously Undiagnosed, Uncertain Prognosis, Complicated, Systemic Symptoms, and Threat to Life/Bodily Function  Patient with established history  of dementia and apparent inability to care for himself presents with IVC hold initiated by his estranged wife.  Patient is comfortable on initial evaluation.  He is confused as to why he is here.  IVC documents suggest that patient has endorsed SI recently.  Patient with recently documented inability to care for himself.  Patient is medically clear at this time for TTS evaluation.   Additional history obtained:  External records from outside sources obtained and reviewed  including prior ED visits and prior Inpatient records.    Lab Tests:  I ordered and personally interpreted labs.  The pertinent results include: CBC, BMP, acetaminophen, salicylate, UA, ethanol   Imaging Studies ordered:  I ordered imaging studies including MRI brain, plain films of lumbar spine I independently visualized and interpreted obtained imaging which showed no acute pathology I agree with the radiologist interpretation.   Cardiac Monitoring:  The patient was maintained on a cardiac monitor.  I personally viewed and interpreted the cardiac monitor which showed an underlying rhythm of: NSR  Problem List / ED Course:  Dementia, IVC hold   Reevaluation:  After the interventions noted above, I reevaluated the patient and found that they have: stayed the same  Disposition:  After consideration of the diagnostic results and the patients response to treatment, I feel that the patent would benefit from psych/social work eval.          Final Clinical Impression(s) / ED Diagnoses Final diagnoses:  Dementia, unspecified dementia severity, unspecified dementia type, unspecified whether behavioral, psychotic, or mood disturbance or anxiety ([place])  Suicidal ideation    Rx / DC Orders ED Discharge Orders     None         Valarie Merino, MD 10/16/21 1929

## 2021-10-16 NOTE — Progress Notes (Signed)
[facility] Placement  Pt meets inpatient criteria per Evette Georges, NP.  Referral was sent to the following facilities;   Destination Service Provider Address Phone Fax  [facility]  [address] [place] [address] [phone]  [facility]  [address] [place] [address] [phone]  [facility]  [employer], [place] [address] [facility]  [address] [place] [address] [phone]  [facility]  [place], [place] [address] [phone]  [facility]  [address] [place] [address] [phone] [facility]  [address], [place] [address] [phone]  [facility]  [address] [place] [address] [phone] [facility]  [address] [place] [address] [phone] [facility]  [address], [place] [address] [phone]  [facility]  [address] [place] [address] [phone] [facility]  [place], [place] [address] [phone]  [facility]  [address] [place] [address] [phone]    Situation ongoing,  CSW will follow up.   Benjaman Kindler, MSW, [facility] 10/16/2021  @ 11:49 PM

## 2021-10-16 NOTE — BH Assessment (Addendum)
Comprehensive Clinical Assessment (CCA) Note  10/16/2021 Juan [name] [id number] Disposition: Clinician discussed patient care with Evette Georges, NP.  He recommends geropsych placement for patient. Clinician informed RN Norm Parcel of disposition via secure messaging.  {  Pt is pleasant and cooperative.  He has good eye contact but is not sure of the date or day.  Patient admits to hearing voices and sometimes seeing faces flash in front of him.  Pt does not evidence any delusional thought process.  He reports not sleeping well, tossing and turning.  Pt says his appetite is norma.  Pt has no outpatient psychiatric care.     Chief Complaint:  Chief Complaint  Patient presents with   IVC   Medical Clearance   Visit Diagnosis: MDD recurrent, severe w/ psychotic features    CCA Screening, Triage and Referral (STR)  Patient Reported Information How did you hear about [place]? Legal System  What Is the Reason for Your Visit/Call Today? Pt presents under IVC.  Petitioner said that patient had tried to pull open the car door when it was moving.  He also wanted his estranged wife and her niece to run him over.  He admits to this happening but says it was about 3 weeks ago.  He denies any current SI.  He says that he gets very frustrated and lonly living in an apartment by himself.  Pt says he has exchanged words with the upstairs apartment dwellers.  Pt denies any current HI.  Patient admits to sometimes talking to people not present.  He may sometimes see people not there "sometimes they flash across my face."  Patient says that his daughter has moved down to [place] with his niece.  He had stayed there with her in May for a few weeks then came back here to [place].  Patient says he was supposed to talk to daughter today but had to come to [facility].  Patient says his niece has offered to be his POA.  Pt says that he is depressed about living alone.  Pt says he quit drinking in 2000.  He does not use any  other substance.  Pt does not have a gun currently.  How Long Has This Been Causing You Problems? 1 wk - 1 month  What Do You Feel Would Help You the Most Today? No data recorded  Have You Recently Had Any Thoughts About Hurting Yourself? Yes  Are You Planning to Commit Suicide/Harm Yourself At This time? No   Have you Recently Had Thoughts About [place]? No  Are You Planning to Harm Someone at This Time? No  Explanation: No data recorded  Have You Used Any Alcohol or Drugs in the Past 24 Hours? No  How Long Ago Did You Use Drugs or Alcohol? No data recorded What Did You Use and How Much? No data recorded  Do You Currently Have a Therapist/Psychiatrist? No  Name of Therapist/Psychiatrist: No data recorded  Have You Been Recently Discharged From Any Office Practice or Programs? No  Explanation of Discharge From Practice/Program: No data recorded    CCA Screening Triage Referral Assessment Type of Contact: No data recorded Telemedicine Service Delivery:   Is this Initial or Reassessment? No data recorded Date Telepsych consult ordered in [facility]:  No data recorded Time Telepsych consult ordered in [facility]:  No data recorded Location of Assessment: [facility] ED  Provider Location: [facility] Assessment Services   Collateral Involvement: No data recorded  Does Patient Have a Court  Appointed Legal Guardian? No data recorded Name and Contact of Legal Guardian: No data recorded If Minor and Not Living with Parent(s), Who has Custody? No data recorded Is CPS involved or ever been involved? Never  Is APS involved or ever been involved? Never   Patient Determined To Be At Risk for Harm To Self or Others Based on Review of Patient Reported Information or Presenting Complaint? No  Method: No data recorded Availability of Means: No data recorded Intent: No data recorded Notification Required: No data recorded Additional Information for Danger to Others Potential: No data  recorded Additional Comments for Danger to Others Potential: No data recorded Are There Guns or Other Weapons in Your Home? No data recorded Types of Guns/Weapons: No data recorded Are These Weapons Safely Secured?                            No data recorded Who Could Verify You Are Able To Have These Secured: No data recorded Do You Have any Outstanding Charges, Pending Court Dates, Parole/Probation? No data recorded Contacted To Inform of Risk of Harm To Self or Others: No data recorded   Does Patient Present under Involuntary Commitment? Yes  IVC Papers Initial File Date: 10/16/21   [place] of Residence: [place]   Patient Currently Receiving the Following Services: Not Receiving Services   Determination of Need: Urgent (48 hours)   Options For Referral: Inpatient Hospitalization (Geropsych placement recommended.)     CCA Biopsychosocial Patient Reported Schizophrenia/Schizoaffective Diagnosis in Past: No   Strengths: No data recorded  Mental Health Symptoms Depression:   Irritability   Duration of Depressive symptoms:  Duration of Depressive Symptoms: Greater than two weeks   Mania:   None   Anxiety:    Irritability; Restlessness; Tension; Worrying   Psychosis:   Hallucinations   Duration of Psychotic symptoms:  Duration of Psychotic Symptoms: Greater than six months   Trauma:   None   Obsessions:   None   Compulsions:   None   Inattention:   None   Hyperactivity/Impulsivity:   None   Oppositional/Defiant Behaviors:   None   Emotional Irregularity:   Chronic feelings of emptiness   Other Mood/Personality Symptoms:  No data recorded   Mental Status Exam Appearance and self-care  Stature:   Average   Weight:   Average weight   Clothing:   Casual   Grooming:   Normal   Cosmetic use:   None   Posture/gait:  No data recorded  Motor activity:   Not Remarkable   Sensorium  Attention:   Distractible   Concentration:    Anxiety interferes   Orientation:   Situation; Place; Person   Recall/memory:   Defective in Recent   Affect and Mood  Affect:   Depressed   Mood:   Depressed   Relating  Eye contact:   Normal   Facial expression:   Depressed   Attitude toward examiner:   Cooperative   Thought and Language  Speech flow:  Clear and Coherent   Thought content:   Appropriate to Mood and Circumstances   Preoccupation:   None   Hallucinations:   Auditory; Visual   Organization:  No data recorded  [employer] of Knowledge:   Fair   Intelligence:   Average   Abstraction:   Functional   Judgement:   Fair   Reality Testing:   Adequate   Insight:   Fair  Decision Making:   Confused; Only simple   Social Functioning  Social Maturity:  No data recorded  Social Judgement:   Normal   Stress  Stressors:   Family conflict; Housing; Museum/gallery curator; Relationship   Coping Ability:   Overwhelmed   Skill Deficits:   Decision making; Self-care   Supports:   Friends/Service system; Family     Religion:    Leisure/Recreation:    Exercise/Diet: Exercise/Diet Have You Gained or Lost A Significant Amount of Weight in the Past Six Months?: No Do You Follow a Special Diet?: No Do You Have Any Trouble Sleeping?: Yes Explanation of Sleeping Difficulties: Not much sleep since yesterday.   CCA Employment/Education Employment/Work Situation: Employment / Work Situation Employment Situation: Retired Has Patient ever [place] in Passenger transport manager?: No  Education: Education Is Patient Currently Attending School?: No Last Grade Completed: 32 Did You Nutritional therapist?: No   CCA Family/Childhood History Family and Relationship History: Family history Marital status: Separated Separated, when?: Since 2018 Does patient have children?: Yes How many children?: 4 How is patient's relationship with their children?: Pt has 3 boys and one girl  Childhood History:   Childhood History By whom was/is the patient raised?: Both parents Did patient suffer any verbal/emotional/physical/sexual abuse as a child?: No Did patient suffer from severe childhood neglect?: No Has patient ever been sexually abused/assaulted/raped as an adolescent or adult?: No Witnessed domestic violence?: No Has patient been affected by domestic violence as an adult?: No  Child/Adolescent Assessment:     CCA Substance Use Alcohol/Drug Use: Alcohol / Drug Use Pain Medications: See PTA medication list Prescriptions: See PTA medication list Over the Counter: See PTA medication list History of alcohol / drug use?: No history of alcohol / drug abuse (Has not drank since 2000.)                         ASAM's:  Six Dimensions of Multidimensional Assessment  Dimension 1:  Acute Intoxication and/or Withdrawal Potential:      Dimension 2:  Biomedical Conditions and Complications:      Dimension 3:  Emotional, Behavioral, or Cognitive Conditions and Complications:     Dimension 4:  Readiness to Change:     Dimension 5:  Relapse, Continued use, or Continued Problem Potential:     Dimension 6:  Recovery/Living Environment:     ASAM Severity Score:    ASAM Recommended Level of Treatment:     Substance use Disorder (SUD)    Recommendations for Services/Supports/Treatments:    Discharge Disposition:    DSM5 Diagnoses: Patient Active Problem List   Diagnosis Date Noted   GERD (gastroesophageal reflux disease) 08/21/2021   Peripheral arterial disease ([place]) 06/13/2021   Essential hypertension 07/30/2020   CAD in native artery 07/30/2020   Sciatica 07/30/2020   Chronic combined systolic and diastolic heart failure ([place]) 01/14/2020   Emphysema lung ([place]) 01/14/2020   Aortic atherosclerosis ([place]) 01/14/2020   Chest pain, rule out acute myocardial infarction 01/13/2020   DM2 (diabetes mellitus, type 2) ([place]) 01/13/2020   Osteoarthritis of left hip 07/22/2017    Iron deficiency anemia 05/17/2017   LBBB (left bundle branch block) 02/11/2016   Port catheter in place 12/25/2015   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   Esophageal cancer ([place]) 11/22/2015   Noise effect on both inner ears 08/29/2015   Right thyroid nodule 08/29/2015     Referrals to Alternative Service(s): Referred to Alternative Service(s):  Place:   Date:   Time:    Referred to Alternative Service(s):   Place:   Date:   Time:    Referred to Alternative Service(s):   Place:   Date:   Time:    Referred to Alternative Service(s):   Place:   Date:   Time:     [place] Session

## 2021-10-16 NOTE — ED Notes (Signed)
Pt walked out he said he was going home

## 2021-10-16 NOTE — ED Triage Notes (Signed)
Pt via [employer], IVC'd by wife. Per report, pt "has mental illness, hallucinates & endorses SI w plan to jump in front of moving car, wants to be shot." Pt calm, cooperative in triage, denies SI/HI.

## 2021-10-16 NOTE — ED Provider Triage Note (Signed)
  Emergency Medicine Provider Triage Evaluation Note  MRN:  [id number]  Arrival date & time: 10/16/21    Medically screening exam initiated at 5:03 AM.   CC:   Back pain  HPI:  Juan [name] is a 81 y.o. year-old male presents to the ED with chief complaint of back pain.  Denies fall or injury.  Reports left sided low back pain for the past month or so.  Denies urinary complaints.  CT from about a month ago shows and L3 fracture.  History provided by patient. ROS:  -As included in HPI PE:   Vitals:   10/16/21 0503  BP: (!) 150/87  Pulse: (!) 57  Resp: 18  Temp: 98 F (36.7 C)  SpO2: 96%    Non-toxic appearing No respiratory distress No rash No bony step-off or deformity MDM:  Based on signs and symptoms, fracture is highest on my differential, followed by chronic pain. I've ordered ua and CT lumbar in triage to expedite lab/diagnostic workup.  Patient was informed that the remainder of the evaluation will be completed by another provider, this initial triage assessment does not replace that evaluation, and the importance of remaining in the ED until their evaluation is complete.    Montine Circle, PA-C 10/16/21 [phone]

## 2021-10-16 NOTE — ED Notes (Signed)
Pt pulled out IV

## 2021-10-16 NOTE — ED Notes (Signed)
Pt in MRI.

## 2021-10-17 LAB — RAPID URINE DRUG SCREEN, HOSP PERFORMED
Amphetamines: NOT DETECTED
Barbiturates: NOT DETECTED
Benzodiazepines: NOT DETECTED
Cocaine: NOT DETECTED
Opiates: NOT DETECTED
Tetrahydrocannabinol: NOT DETECTED

## 2021-10-17 LAB — URINALYSIS, ROUTINE W REFLEX MICROSCOPIC
Bilirubin Urine: NEGATIVE
Glucose, UA: NEGATIVE mg/dL
Hgb urine dipstick: NEGATIVE
Ketones, ur: NEGATIVE mg/dL
Leukocytes,Ua: NEGATIVE
Nitrite: NEGATIVE
Protein, ur: NEGATIVE mg/dL
Specific Gravity, Urine: 1.018 (ref 1.005–1.030)
pH: 5 (ref 5.0–8.0)

## 2021-10-17 MED ORDER — AMITRIPTYLINE HCL 10 MG PO TABS
10.0000 mg | ORAL_TABLET | Freq: Every day | ORAL | Status: DC
Start: 2021-10-17 — End: 2021-10-22
  Administered 2021-10-18 – 2021-10-21 (×3): 10 mg via ORAL
  Filled 2021-10-17 (×6): qty 1

## 2021-10-17 MED ORDER — MEMANTINE HCL 10 MG PO TABS
10.0000 mg | ORAL_TABLET | Freq: Two times a day (BID) | ORAL | Status: DC
  Administered 2021-10-19 – 2021-10-22 (×6): 10 mg via ORAL
  Filled 2021-10-17 (×7): qty 1

## 2021-10-17 MED ORDER — STERILE WATER FOR INJECTION IJ SOLN
INTRAMUSCULAR | Status: AC
  Administered 2021-10-17: 10 mL
  Filled 2021-10-17: qty 10

## 2021-10-17 MED ORDER — SACUBITRIL-VALSARTAN 24-26 MG PO TABS
1.0000 | ORAL_TABLET | Freq: Two times a day (BID) | ORAL | Status: DC
  Administered 2021-10-18 – 2021-10-22 (×8): 1 via ORAL
  Filled 2021-10-17 (×12): qty 1

## 2021-10-17 MED ORDER — MEMANTINE HCL 10 MG PO TABS
10.0000 mg | ORAL_TABLET | Freq: Every day | ORAL | Status: AC
  Administered 2021-10-18: 10 mg via ORAL
  Filled 2021-10-17 (×2): qty 1

## 2021-10-17 MED ORDER — ZIPRASIDONE MESYLATE 20 MG IM SOLR
20.0000 mg | Freq: Once | INTRAMUSCULAR | Status: AC
Start: 2021-10-17 — End: 2021-10-17
  Administered 2021-10-17: 20 mg via INTRAMUSCULAR
  Filled 2021-10-17: qty 20

## 2021-10-17 MED ORDER — SPIRONOLACTONE 25 MG PO TABS
25.0000 mg | ORAL_TABLET | Freq: Every day | ORAL | Status: DC
  Administered 2021-10-19 – 2021-10-22 (×4): 25 mg via ORAL
  Filled 2021-10-17 (×4): qty 1

## 2021-10-17 NOTE — Progress Notes (Addendum)
[place] Placement  Pt meets inpatient criteria per , Evette Georges, NP.  There are no available beds at [facility]. Referral was sent to the following facilities;    Destination Service Provider Address Phone Fax  [facility]  [address] [place] [address] [phone]  [place]  [address] [place] [address] [phone]  [facility]  [place], [place] [address] [facility]  [address] [place] [address] [phone]  [facility]  [place], [place] [address] [phone]  [facility]  [place] [place] [address] [phone] [facility]  [address], [place] [address] [phone]  [facility]  [address], [place] [address] [phone] [place]  [address] [place] [address] [phone] [place]  [address], [place] [address] [phone]  [facility]  [address] [place] [address] [phone] [facility]  [place], [place] [address] [phone]  [facility]  [address], [place] [address] [phone] [place]., [place] [address] [phone]  [facility]  [place], [place] [address] [phone]   Situation ongoing,  CSW will follow up.   Benjaman Kindler, MSW, [facility] 10/17/2021  @ 4:08 PM

## 2021-10-17 NOTE — ED Notes (Signed)
Patient told the sitter he was going to get up and run in  5 minutes he then got up and postured at NT; Patient begin swinging his fist and picked up trash can to throw at sitters in unit; RN notified EDP and charge and emergency med ordered and administered with Security and [employer] assistance.-Monique,RN

## 2021-10-17 NOTE — ED Notes (Signed)
Patient sitting up at bedside eating breakfast, in NAD.

## 2021-10-17 NOTE — ED Provider Notes (Signed)
Emergency Medicine Observation Re-evaluation Note  Shanti Eichel is a 81 y.o. male, seen on rounds today.  Pt initially presented to the ED for complaints of IVC and Medical Clearance Currently, the patient is eating breakfast.  Physical Exam  BP (!) 155/73 (BP Location: Right Arm)   Pulse 62   Temp 98.4 F (36.9 C) (Oral)   Resp 17   SpO2 100%  Physical Exam General: no acute distress Lungs: normal effort Psych: no SI/HI. No acute agitation  ED Course / MDM  EKG:   I have reviewed the labs performed to date as well as medications administered while in observation.  Recent changes in the last 24 hours include home meds being ordered.  Plan  Current plan is for geri-psych placement. Damean [place] is under involuntary commitment.      Sherwood Gambler, MD 10/17/21 [phone]

## 2021-10-17 NOTE — ED Notes (Signed)
Patient refusing to have his vital signs taken.

## 2021-10-17 NOTE — ED Notes (Signed)
TTS in progress now.

## 2021-10-17 NOTE — Progress Notes (Signed)
Patient has been denied by [facility] due to no appropriate beds available. Patient meets [place] inpatient criteria per Evette Georges, NP. Patient has been faxed out to the following facilities:    [facility]  [address] [place] [address] [phone] [place], [place] [address] [phone]  [facility]  [place], [place] [address] [place]  [facility]  [address] [place] [address] [phone]  [place]  [place], [place] [address] [phone] [facility]  [place]., [place] [address] [phone] [facility]  [address], [place] [address] [phone]  [facility]  [address] [place] [address] [phone] [place]  [address], [place] [address] [phone] [place]  [address] [place] [address] [phone]  [facility]  [address] [place] [address] [phone] [facility]  [place], [place] [address] [phone]  [facility]  [address] [place], [place] [address] [phone] [place] [place] [address] [phone]  [facility]  [place], [place] [address] [phone]   Mariea Clonts, MSW, LCSW-A  10:44 PM 10/17/2021

## 2021-10-17 NOTE — ED Notes (Signed)
TTS complete 

## 2021-10-17 NOTE — ED Notes (Signed)
Caryl Pina daughter [phone] requesting to speak to the patient

## 2021-10-18 NOTE — ED Notes (Signed)
Pt states he will take his medications right now with his breakfast,

## 2021-10-18 NOTE — ED Notes (Signed)
Patient is sitting in chair. Introduced myself to patient. No needs at this time.

## 2021-10-18 NOTE — ED Notes (Signed)
Patient is resting in recliner, watching TV.

## 2021-10-18 NOTE — ED Provider Notes (Signed)
Emergency Medicine Observation Re-evaluation Note  Juan [name] is a 81 y.o. male, seen on rounds today.  Pt initially presented to the ED for complaints of dementia with behavioral issues. Currently resting/calm, nad.   Physical Exam  BP (!) 142/96 (BP Location: Right Arm)   Pulse 72   Temp 98 F (36.7 C) (Oral)   Resp 18   SpO2 99%  Physical Exam General: calm. Cardiac: reg rate Lungs: breathing comfortably Psych: calm resting.  ED Course / MDM    I have reviewed the labs performed to date as well as medications administered while in observation.  Recent changes in the last 24 hours include ED obs, reassessment.   Plan  BH evaluation and placement pending.  Dispo per [facility] team.         Lajean Saver, MD 10/18/21 [phone]

## 2021-10-18 NOTE — ED Notes (Signed)
Patient took his medication. Is in a pleasant mood. Patient was given a snack. No needs at this time.

## 2021-10-18 NOTE — ED Notes (Signed)
Pt refused to take the remaining medications, "you should have given them to me when I said I'd take them" . Mr. Juan [name] turned onto his left side and went to sleep.

## 2021-10-19 MED ORDER — ACETAMINOPHEN 325 MG PO TABS
650.0000 mg | ORAL_TABLET | Freq: Four times a day (QID) | ORAL | Status: DC | PRN
  Administered 2021-10-19 – 2021-10-21 (×2): 650 mg via ORAL
  Filled 2021-10-19 (×2): qty 2

## 2021-10-19 NOTE — ED Notes (Signed)
Patient is laying in bed watching TV

## 2021-10-19 NOTE — ED Notes (Signed)
Patient is no longer in bed. He is sitting in the recliner watching TV.

## 2021-10-19 NOTE — ED Notes (Signed)
RN attempted to give patient medications. Initially patient stated it was too early to take medications. RN verified time and reported to patient who declined medications again.

## 2021-10-19 NOTE — ED Notes (Signed)
Patient sitting in recliner at bedside eating lunch. Patient denies pain at this time. Patient requested to know day and date.

## 2021-10-19 NOTE — ED Provider Notes (Signed)
Emergency Medicine Observation Re-evaluation Note  Juan [name] is a 81 y.o. male, seen on rounds today.  Pt initially presented to the ED for complaints of dementia with behavioral symptoms. Currently calm, no acute distress.   Physical Exam  BP (!) 144/91 (BP Location: Right Arm)   Pulse (!) 56   Temp 97.7 F (36.5 C) (Oral)   Resp 15   SpO2 95%  Physical Exam General: calm, alert.  Cardiac: regular rate Lungs: breathing comfortably Psych: normal mood/affect, calm.   ED Course / MDM   I have reviewed the labs performed to date as well as medications administered while in observation.  Recent changes in the last 24 hours include ED obs, med management, reassessment.   Plan  [place] team is recommending geropsych tx. That said, geropsych beds are very scarce, and not clear geropsych placement will lead to resolution of issues (as given dementia, counseling/therapy of ? Benefit, and patients meds are not being actively adjusted) - will ask [facility] team to reassess and update disposition plan.      Lajean Saver, MD 10/19/21 1210

## 2021-10-19 NOTE — ED Notes (Signed)
Patient is resting in bed.

## 2021-10-19 NOTE — ED Notes (Signed)
RN attempted for a 3rd time to get patient to take his medication. Patient verbally refused medication.

## 2021-10-19 NOTE — ED Notes (Signed)
Patient ambulated to restroom independently and returned to room. Patient is resting at this time.

## 2021-10-19 NOTE — ED Notes (Signed)
Patient is sitting in recliner.

## 2021-10-19 NOTE — ED Notes (Signed)
Patient up to the bathroom.

## 2021-10-19 NOTE — ED Notes (Signed)
Patient refused meds and vitals after several attempts; Pt is verbally aggressive towards staff-Monique,RN

## 2021-10-19 NOTE — ED Notes (Signed)
Patient is awake and sitting in recliner.

## 2021-10-19 NOTE — Progress Notes (Signed)
Per Evette Georges, NP, patient meets criteria for inpatient treatment. There are no available beds at [facility] today. CSW faxed referrals to the following facilities for review:  [address] [place] [address] [phone] --  [place], [place] [address] [place] --  [place], [place] [address] [phone] --  [place] [address] [place] [address] [phone] --  [place]  Pending - Request Sent N/A [place], [place] [address] [phone] --  [facility]  Pending - Request Sent N/A [address] [place] [address] [phone] --  [place] [address], [place] [address] [phone] --  [facility]  Pending - Request Sent N/A [place]., [place] [address] [phone] --  [place] [address], [place] [address] [phone] --  [facility]  Pending - Request Sent N/A [address] [place] [address] [phone] --  [facility]  Pending - Request Sent N/A [address] [place] [address] [phone] --  [place] N/A [place], [place] [address] [phone] --  [facility]  Pending - Request Sent N/A [address], [place] [address] [phone] --  [facility]  Pending - Request Sent N/A [address] [place] [address] [phone] --  [facility]  Pending - Request Sent N/A [place], [place] [address] [phone] --  [facility]  Pending - No Request Sent N/A [address] [place] [address] [phone] --  [facility]  Pending - No Request Sent N/A [place], [place] [address] [phone] --  [facility]  Pending - No Request Sent N/A [address] [place] [address] [phone] --   TTS will continue to seek bed placement.  [place]ie [place], MSW, [place] Phone: [phone] Disposition/TOC

## 2021-10-19 NOTE — ED Notes (Signed)
Patient states he is having legs cramps. Dr made aware and patient was given tylenol.

## 2021-10-19 NOTE — ED Notes (Signed)
Patient is standing at the door. Patient is pleasant. Has not slept much throughout the night.

## 2021-10-20 ENCOUNTER — Other Ambulatory Visit: Payer: Medicare HMO

## 2021-10-20 DIAGNOSIS — R451 Restlessness and agitation: Secondary | ICD-10-CM | POA: Diagnosis not present

## 2021-10-20 MED ORDER — LORAZEPAM 2 MG/ML IJ SOLN
1.0000 mg | Freq: Once | INTRAMUSCULAR | Status: AC
  Administered 2021-10-20: 1 mg via INTRAMUSCULAR
  Filled 2021-10-20: qty 1

## 2021-10-20 MED ORDER — HALOPERIDOL LACTATE 5 MG/ML IJ SOLN
5.0000 mg | Freq: Once | INTRAMUSCULAR | Status: AC
Start: 2021-10-20 — End: 2021-10-20
  Administered 2021-10-20: 5 mg via INTRAMUSCULAR
  Filled 2021-10-20: qty 1

## 2021-10-20 MED ORDER — OLANZAPINE 5 MG PO TABS
5.0000 mg | ORAL_TABLET | Freq: Every day | ORAL | Status: DC
  Administered 2021-10-20 – 2021-10-21 (×2): 5 mg via ORAL
  Filled 2021-10-20 (×2): qty 1

## 2021-10-20 NOTE — Progress Notes (Signed)
Inpatient Behavioral Health Placement  Pt meets inpatient criteria per Evette Georges, NP. There are no available beds at [facility]. Referral was sent to the following facilities;   Destination Service Provider Address Phone Fax  [facility]  [address] [place] [address] [phone]  [place]  [address] [place] [address] [phone]  [facility]  [place], [place] [address] [facility]  [address] [place] [address] [phone]  [facility]  [place], [place] [address] [phone]  [facility]  [place] [address] [phone] [facility]  [address] [place] [address] [phone]  [facility]  [address] [place] [address] [phone] [place]  [address], [place] [address] [phone] [place]  [address], [place] [address] [phone]  [facility]  [address] [place] [address] [phone] [facility]  [place], [place] [address] [phone]  [facility]  [address], [place] [address] [phone] [address] [place] [address] [phone]  [facility]  [place], [place] [address] [phone]  [facility]  [address] [place] [address] [phone]  [facility]  [address], [place]  [address] [phone]  [facility]  [address], [place] [address] [phone]  [facility]  [address] [place] [address] [phone]    Situation ongoing,  CSW will follow up.   Benjaman Kindler, MSW, LCSWA 10/20/2021  @ 2:53 PM

## 2021-10-20 NOTE — ED Notes (Signed)
Patient had to be held down to administered IM shot due to patient attempted to snatch needle from RN; Security and [employer] assisted; Patient then got up and threaten [employer] and Security and picked up bedside table throwing it in the direction of Security and [employer]; RN notified EDP and violent restraints ordered for safety-Monique,RN
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
## 2021-10-20 NOTE — ED Notes (Signed)
Breakfast order placed ?

## 2021-10-20 NOTE — ED Notes (Signed)
Pt expressed need to go to bathroom. Last two restraints removed
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
## 2021-10-21 NOTE — ED Notes (Signed)
Breakfast order placed ?
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
## 2021-10-21 NOTE — Progress Notes (Signed)
[place] Placement  Pt meets inpatient criteria per Vesta Mixer, NP. Referral was sent to the following facilities;    Destination Service Provider Address Phone Fax  [facility]  [address] [place] [address] [phone]  [place]  [address], [place] [address] [phone]  [facility]  [place], [place] [address] [facility]  [address] [place] [address] [phone]  [facility]  [place], [place] [address] [phone]  [facility]  [place] [place] [address] [phone] [facility]  [address] [place] [address] [phone]  [facility]  [address] [place] [address] [phone] [place]  [address], [place] [address] [phone] [place]  [address] [place] [address] [phone]  [facility]  [address] [place] [address] [phone] [facility]  [place], [place] [address] [phone]  [facility]  [address], [place] [address] [phone] [place] [place] [address] [phone]  [facility]  [place], [place] [address] [phone]  [facility]  [address], [place] [address] [phone]  [facility]  [address], [place] [address] [phone]  [facility]  [address] [place] [address] [phone]  [facility]  [address], [place] [address] [phone]   Situation ongoing,  CSW will follow up.   Benjaman Kindler, MSW, [facility] 10/21/2021  @ 5:55 PM

## 2021-10-21 NOTE — ED Provider Notes (Signed)
Emergency Medicine Observation Re-evaluation Note  Juan [name] is a 81 y.o. male, seen on rounds today.  Pt initially presented to the ED for complaints of IVC and Medical Clearance Currently, the patient is rummaging around his room with the bed sheets.  Physical Exam  BP 122/63 (BP Location: Left Arm)   Pulse 71   Temp 98 F (36.7 C) (Oral)   Resp 18   SpO2 93%  Physical Exam General: Nontoxic appearing Cardiac: Normal heart Lungs: Normal respiratory rate Psych: Appears demented  ED Course / MDM  EKG:   I have reviewed the labs performed to date as well as medications administered while in observation.  Recent changes in the last 24 hours include he has been stable.  Plan  Current plan is for geriatric psychiatric admission. Juan [name] is under involuntary commitment.      Daleen Bo, MD 10/21/21 1009

## 2021-10-21 NOTE — Progress Notes (Signed)
Per Dr. Weber Cooks pt would need to be reviewed with Dr. Louis Meckel tomorrow, however possibility of dementia therefore pt would not be appropriate for [facility].   Benjaman Kindler, MSW, [facility] 10/21/2021 5:54 PM

## 2021-10-22 MED ORDER — OLANZAPINE 5 MG PO TABS
5.0000 mg | ORAL_TABLET | Freq: Once | ORAL | Status: AC
  Administered 2021-10-22: 5 mg via ORAL
  Filled 2021-10-22: qty 1

## 2021-10-22 NOTE — Progress Notes (Signed)
Pt has been psych clear per Vesta Mixer, NP. CSW will now remove pt from the [facility] shift report. TOC will assist with any discharge needs.   Benjaman Kindler, MSW, [facility] 10/22/2021 12:17 PM

## 2021-10-22 NOTE — ED Notes (Signed)
Review of IVC Papers: IVC expires 10/23/2021

## 2021-10-22 NOTE — Discharge Instructions (Signed)
Please follow-up with your primary care physician for further help with dementia diagnosis and evaluation by neurologist that patient can be qualified for a memory care unit.
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
## 2021-10-22 NOTE — ED Provider Notes (Signed)
Patient seen by [facility] and cleared from a psych stand point. IVC resinded. [facility] requesting TOC consult for SNF placement. TOC has already reached out to family and gave resources for neurology eval for memory care requirements. Not a candidate for SNF able to ambulate without difficulty. Not appropriate for long term care SNF b/c pt is a flight risk. Options include home health care versus, live with a family member, versus getting qualified for memory care with proper dx of dementia.   Patient in no distress and overall condition improved here in the ED. Detailed discussions were had with the patient regarding current findings, and need for close f/u with PCP or on call doctor. The patient has been instructed to return immediately if the symptoms worsen in any way for re-evaluation. Patient verbalized understanding and is in agreement with current care plan. All questions answered prior to discharge.    Campbell Stall P, DO 32/44/01 1133

## 2021-10-22 NOTE — ED Notes (Signed)
Breakfast order placed ?

## 2021-10-22 NOTE — Progress Notes (Signed)
[facility] Psych ED Progress Note  10/22/2021 11:13 AM Juan [name]  MRN:  [id number]   Subjective:   Patient seen in his room for face-to-face reevaluation.  Patient reports sleeping very well at night and no problems with appetite.  He is alert and oriented x4.  He is able to tell me his full name, date of birth, day, month, year, and current president.  He is able to tell me his bills are due on the fifth of every month and he make sure they are paid.  He does tell me his energy bill is due on a different day but he also make sure that 1 gets paid.  Patient feels like he is able to return back to his apartment where he lives.  He denies any current suicidal or homicidal ideations.  Denies any auditory or visual hallucinations.  Our conversation is linear and coherent.  He is able to contract for safety, assures me that he can attend to his ADLs.  I asked him what he will do if he feels like his neighbors are being noisy and bothering him he stated he will go talk to them or he can call the police if needed.   I spoke with patient's ex-wife, Will Heinkel, at [phone].  She is worried about the patient going back to his apartment and living alone.  She tells me he has had 2 neurology appointments at [facility] neurology and has been diagnosed with dementia.  She is worried his dementia is getting worse and he has not been attending to his ADLs.  I spoke to her about possible transition to higher level of care, she is hoping to get assistance and more information on this.   At this time patient is psychiatrically cleared.  He denies SI/HI/AVH.  He has shown significant improvement in orientation since his original presentation at the hospital.  He was started on Zyprexa 5 mg p.o. nightly and states his sleeping has significantly improved.  He is wanting to discharge back home to his apartment.   Principal Problem: Agitation Diagnosis:  Principal Problem:   Agitation   ED Assessment Time Calculation: Start  Time: 5427 Stop Time: 1110 Total Time in Minutes (Assessment Completion): 25   Past Psychiatric History:  See previous documentation  [place] Scale:  [place] ED from 10/16/2021 in [place] Most recent reading at 10/16/2021  3:39 PM ED from 10/16/2021 in [place] Most recent reading at 10/16/2021  5:20 AM ED from 10/05/2021 in [place] Most recent reading at 10/05/2021  2:07 AM  C-SSRS RISK CATEGORY No Risk No Risk No Risk       Past Medical History:  Past Medical History:  Diagnosis Date   Aortic atherosclerosis (HCC)    Back pain    Barrett esophagus    Bleeding nose    CAD in native artery 07/30/2020   DDD (degenerative disc disease), thoracolumbar    Moderate to severe   Diabetes mellitus without complication ([place])    Diverticulosis    Esophageal cancer ([place]) dx'd 2017   Essential hypertension 07/30/2020   GERD (gastroesophageal reflux disease)    GERD (gastroesophageal reflux disease) 08/21/2021   Headache    Hearing loss    Right ear   History of umbilical hernia repair    Idiopathic peripheral neuropathy    Iron deficiency anemia    LBBB (left bundle branch block) 02/11/2016   Left hip  pain    Severe degenerative changes   Lipoma of neck    Right   Mixed hyperlipidemia    PONV (postoperative nausea and vomiting)    Reflux    Right thyroid nodule    Sciatica 07/30/2020    Past Surgical History:  Procedure Laterality Date   BALLOON DILATION N/A 05/06/2018   Procedure: BALLOON DILATION;  Surgeon: Carol Ada, MD;  Location: [facility];  Service: Endoscopy;  Laterality: N/A;   COLONOSCOPY     ESOPHAGOGASTRODUODENOSCOPY (EGD) WITH PROPOFOL N/A 05/06/2018   Procedure: ESOPHAGOGASTRODUODENOSCOPY (EGD) WITH PROPOFOL;  Surgeon: Carol Ada, MD;  Location: [facility];  Service: Endoscopy;  Laterality: N/A;   EUS N/A 12/05/2015   Procedure: UPPER  ENDOSCOPIC ULTRASOUND (EUS) LINEAR;  Surgeon: Carol Ada, MD;  Location: [facility];  Service: Endoscopy;  Laterality: N/A;   EUS N/A 03/10/2016   Procedure: UPPER ENDOSCOPIC ULTRASOUND (EUS) LINEAR;  Surgeon: Carol Ada, MD;  Location: [facility];  Service: Endoscopy;  Laterality: N/A;   EUS N/A 01/07/2017   Procedure: UPPER ENDOSCOPIC ULTRASOUND (EUS) LINEAR;  Surgeon: Carol Ada, MD;  Location: [place];  Service: Endoscopy;  Laterality: N/A;   HERNIA REPAIR     IR GENERIC HISTORICAL  12/24/2015   IR FLUORO GUIDE PORT INSERTION RIGHT 12/24/2015 Arne Cleveland, MD [facility]   IR GENERIC HISTORICAL  12/24/2015   IR US GUIDE VASC ACCESS RIGHT 12/24/2015 Arne Cleveland, MD [facility]   IR REMOVAL TUN ACCESS W/ PORT W/O FL MOD SED  02/07/2018   PORTA CATH INSERTION     RIGHT/LEFT HEART CATH AND CORONARY ANGIOGRAPHY N/A 01/15/2020   Procedure: RIGHT/LEFT HEART CATH AND CORONARY ANGIOGRAPHY;  Surgeon: Nelva Bush, MD;  Location: [facility];  Service: Cardiovascular;  Laterality: N/A;   SHOULDER ARTHROSCOPY W/ SUPERIOR LABRAL ANTERIOR POSTERIOR LESION REPAIR     times 2   TOTAL HIP ARTHROPLASTY Left 07/22/2017   Procedure: LEFT TOTAL HIP ARTHROPLASTY ANTERIOR APPROACH;  Surgeon: Rod Can, MD;  Location: [facility];  Service: Orthopedics;  Laterality: Left;   UPPER GI ENDOSCOPY  01/07/2017   Family History:  Family History  Problem Relation Age of Onset   Colon cancer Father 92   Colon cancer Brother 37   Heart attack Brother    Heart disease Mother    Heart attack Mother    Diabetes Sister    Stroke Sister     Social History:  Social History   Substance and Sexual Activity  Alcohol Use Not Currently     Social History   Substance and Sexual Activity  Drug Use No    Social History   Socioeconomic History   Marital status: Legally Separated    Spouse name: Not on file   Number of children: 9   Years of education: 4   Highest education level:  Not on file  Occupational History   Not on file  Tobacco Use   Smoking status: Former    Packs/day: 2.00    Years: 40.00    Total pack years: 80.00    Types: Cigarettes    Quit date: 03/31/1999    Years since quitting: 22.5   Smokeless tobacco: Never  Vaping Use   Vaping Use: Never used  Substance and Sexual Activity   Alcohol use: Not Currently   Drug use: No   Sexual activity: Not on file  Other Topics Concern   Not on file  Social History Narrative   ** Merged History Encounter ** **  Merged History Encounter ** Married, wife [name]   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 and 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   Right handed   4 children   Wife and he is separate   Caffeine free   One story home   Social Determinants of Radio broadcast assistant Strain: Not on file  Food Insecurity: Not on file  Transportation Needs: Not on file  Physical Activity: Not on file  Stress: Not on file  Social Connections: Not on file    Sleep: Good  Appetite:  Good  Current Medications: Current Facility-Administered Medications  Medication Dose Route Frequency Provider Last Rate Last Admin   acetaminophen (TYLENOL) tablet 1,000 mg  1,000 mg Oral Once Valarie Merino, MD       acetaminophen (TYLENOL) tablet 650 mg  650 mg Oral Q6H PRN Wynona Dove A, DO   650 mg at 10/21/21 0131   albuterol (PROVENTIL) (2.5 MG/3ML) 0.083% nebulizer solution 2.5 mg  2.5 mg Nebulization Q6H PRN Valarie Merino, MD       amitriptyline (ELAVIL) tablet 10 mg  10 mg Oral QHS Sherwood Gambler, MD   10 mg at 10/21/21 2139   aspirin EC tablet 81 mg  81 mg Oral Daily Valarie Merino, MD   81 mg at 10/22/21 4097   atorvastatin (LIPITOR) tablet 40 mg  40 mg Oral Daily Valarie Merino, MD   40 mg at 10/22/21 0927   carvedilol (COREG) tablet 3.125 mg  3.125 mg Oral BID WC Valarie Merino, MD   3.125 mg at 10/22/21 0831   dapagliflozin propanediol (FARXIGA) tablet 5 mg  5 mg Oral Daily Valarie Merino, MD   5 mg at 10/22/21  3532   gabapentin (NEURONTIN) capsule 300 mg  300 mg Oral BID Valarie Merino, MD   300 mg at 10/22/21 9924   glimepiride (AMARYL) tablet 1 mg  1 mg Oral q morning Valarie Merino, MD   1 mg at 10/22/21 0927   memantine (NAMENDA) tablet 10 mg  10 mg Oral BID Sherwood Gambler, MD   10 mg at 10/22/21 0927   OLANZapine (ZYPREXA) tablet 5 mg  5 mg Oral QHS Vesta Mixer, NP   5 mg at 10/21/21 2138   pantoprazole (PROTONIX) EC tablet 40 mg  40 mg Oral QAC breakfast Valarie Merino, MD   40 mg at 10/22/21 0831   sacubitril-valsartan (ENTRESTO) 24-26 mg per tablet  1 tablet Oral BID Sherwood Gambler, MD   1 tablet at 10/22/21 2683   spironolactone (ALDACTONE) tablet 25 mg  25 mg Oral Daily Sherwood Gambler, MD   25 mg at 10/22/21 4196   Current Outpatient Medications  Medication Sig Dispense Refill   albuterol (VENTOLIN HFA) 108 (90 Base) MCG/ACT inhaler Inhale 2 puffs into the lungs every 6 (six) hours as needed for wheezing or shortness of breath.      amitriptyline (ELAVIL) 10 MG tablet Take 10 mg by mouth at bedtime.     aspirin EC 81 MG tablet Take 81 mg by mouth daily.     atorvastatin (LIPITOR) 40 MG tablet Take 1 tablet (40 mg total) by mouth daily. 90 tablet 3   carvedilol (COREG) 3.125 MG tablet Take 1 tablet (3.125 mg total) by mouth 2 (two) times daily with a meal. 180 tablet 2   cetirizine (ZYRTEC ALLERGY) 10 MG tablet Take 1 tablet (10 mg total) by mouth daily. (Patient taking differently: Take 10 mg by mouth daily as  needed for allergies or rhinitis.) 30 tablet 0   FARXIGA 5 MG TABS tablet Take 5 mg by mouth daily.     fluticasone (FLONASE) 50 MCG/ACT nasal spray Place 1 spray into both nostrils daily. (Patient taking differently: Place 1 spray into both nostrils daily as needed for allergies or rhinitis.) 16 g 0   furosemide (LASIX) 20 MG tablet Take 1 tablet (20 mg total) by mouth daily. 30 tablet 0   gabapentin (NEURONTIN) 300 MG capsule Take 300 mg by mouth 2 (two) times daily.      glimepiride (AMARYL) 1 MG tablet Take 1 mg by mouth daily with breakfast.     memantine (NAMENDA) 10 MG tablet Take 1 tablet (10 mg at night) for 2 weeks, then increase to 1 tablet (10 mg) twice a day 60 tablet 11   Multiple Vitamins-Minerals (CENTRUM SILVER 50+MEN) TABS Take 1 tablet by mouth daily with breakfast.     nitroGLYCERIN (NITROSTAT) 0.4 MG SL tablet PLACE 1 TABLET (0.4 MG TOTAL) UNDER THE TONGUE EVERY FIVE MINUTES AS NEEDED FOR CHEST PAIN. (Patient taking differently: Place 0.4 mg under the tongue every 5 (five) minutes as needed for chest pain.) 25 tablet 12   oxyCODONE-acetaminophen (PERCOCET) 10-325 MG tablet Take 1 tablet by mouth every 6 (six) hours as needed for pain.     pantoprazole (PROTONIX) 40 MG tablet Take 1 tablet (40 mg total) by mouth daily before breakfast. 90 tablet 1   spironolactone (ALDACTONE) 25 MG tablet Take 1 tablet (25 mg total) by mouth daily. 90 tablet 3   tiZANidine (ZANAFLEX) 2 MG tablet Take 2 mg by mouth every 8 (eight) hours as needed for muscle spasms.     sacubitril-valsartan (ENTRESTO) 24-26 MG Take 1 tablet by mouth 2 (two) times daily. 60 tablet 1    Lab Results: No results found for this or any previous visit (from the past 48 hour(s)).  Blood Alcohol level:  Lab Results  Component Value Date   ETH <10 10/16/2021   ETH <5 12/19/2015    Psychiatric Specialty Exam:  Presentation  General Appearance: Appropriate for Environment  Eye Contact:Good  Speech:Clear and Coherent  Speech Volume:Normal  Handedness:No data recorded  Mood and Affect  Mood:Euthymic  Affect:Congruent   Thought Process  Thought Processes:Coherent  Descriptions of Associations:Intact  Orientation:Full (Time, Place and Person)  Thought Content:Logical  History of Schizophrenia/Schizoaffective disorder:No  Duration of Psychotic Symptoms:N/A  Hallucinations:Hallucinations: None  Ideas of Reference:None  Suicidal Thoughts:Suicidal Thoughts:  No  Homicidal Thoughts:Homicidal Thoughts: No   Sensorium  Memory:Immediate Fair  Judgment:Fair  Insight:Fair   Executive Functions  Concentration:Fair  Attention Span:Fair  [place] of Knowledge:Good  Language:Good   Psychomotor Activity  Psychomotor Activity:Psychomotor Activity: Normal   Assets  Assets:Communication Skills; Desire for Improvement; Physical Health; Resilience; Social Support   Sleep  Sleep:Sleep: Good    Physical Exam: Physical Exam Neurological:     Mental Status: He is alert and oriented to person, place, and time.    Review of Systems  Psychiatric/Behavioral:  Positive for memory loss.    Blood pressure (!) 164/102, pulse 69, temperature (!) 97.5 F (36.4 C), temperature source Oral, resp. rate 18, SpO2 98 %. There is no height or weight on file to calculate BMI.   Medical Decision Making: Patient case reviewed and discussed with Dr. Dwyane Dee.  Patient does not meet criteria for IVC or inpatient psychiatric treatment.  Patient needs to continue to follow-up with his outpatient neurology to manage his  recent dementia diagnosis.  Recommended to his ex-wife to consider transition to higher level of care if she feels like he cannot take care of himself at home.  Disposition: Psych cleared  Vesta Mixer, NP 10/22/2021, 11:13 AM

## 2021-10-23 ENCOUNTER — Ambulatory Visit: Payer: Medicare HMO

## 2021-10-24 ENCOUNTER — Telehealth: Payer: Self-pay | Admitting: Physician Assistant

## 2021-10-24 NOTE — Telephone Encounter (Signed)
Pt's daughter called in stating the pt had recently been in the hospital due to him acting out with his dementia. She is not on the DPR, but is up here from [place] trying to get him placed into a facility for his safety. The patient's wife is currently missing, they cannot get in contact with her. The social security administration is telling her they need a letter stating the pt cannot make decisions for himself. They need it faxed to [phone].

## 2021-10-24 NOTE — Telephone Encounter (Signed)
Called Caryl Pina and reported to her per Sharene Butters PA-C that we are unable to give out any information to her until she has POA .

## 2021-10-28 ENCOUNTER — Emergency Department (HOSPITAL_COMMUNITY)
Admission: EM | Admit: 2021-10-28 | Discharge: 2021-10-31 | Disposition: A | Discharge status: Home / Self Care | Payer: Medicare HMO | Attending: Emergency Medicine | Admitting: Emergency Medicine

## 2021-10-28 ENCOUNTER — Encounter (HOSPITAL_COMMUNITY): Payer: Self-pay | Admitting: Emergency Medicine

## 2021-10-28 ENCOUNTER — Other Ambulatory Visit: Payer: Self-pay

## 2021-10-28 DIAGNOSIS — Z7982 Long term (current) use of aspirin: Secondary | ICD-10-CM | POA: Diagnosis not present

## 2021-10-28 DIAGNOSIS — Z79899 Other long term (current) drug therapy: Secondary | ICD-10-CM | POA: Diagnosis not present

## 2021-10-28 DIAGNOSIS — F989 Unspecified behavioral and emotional disorders with onset usually occurring in childhood and adolescence: Secondary | ICD-10-CM | POA: Diagnosis not present

## 2021-10-28 DIAGNOSIS — F0390 Unspecified dementia without behavioral disturbance: Secondary | ICD-10-CM | POA: Diagnosis not present

## 2021-10-28 DIAGNOSIS — R45850 Homicidal ideations: Secondary | ICD-10-CM | POA: Diagnosis not present

## 2021-10-28 DIAGNOSIS — F69 Unspecified disorder of adult personality and behavior: Secondary | ICD-10-CM

## 2021-10-28 LAB — CBC WITH DIFFERENTIAL/PLATELET
Abs Immature Granulocytes: 0.02 10*3/uL (ref 0.00–0.07)
Basophils Absolute: 0 10*3/uL (ref 0.0–0.1)
Basophils Relative: 0 %
Eosinophils Absolute: 0 10*3/uL (ref 0.0–0.5)
Eosinophils Relative: 1 %
HCT: 44.3 % (ref 39.0–52.0)
Hemoglobin: 13.7 g/dL (ref 13.0–17.0)
Immature Granulocytes: 0 %
Lymphocytes Relative: 19 %
Lymphs Abs: 1.3 10*3/uL (ref 0.7–4.0)
MCH: 23.8 pg — ABNORMAL LOW (ref 26.0–34.0)
MCHC: 30.9 g/dL (ref 30.0–36.0)
MCV: 77 fL — ABNORMAL LOW (ref 80.0–100.0)
Monocytes Absolute: 0.7 10*3/uL (ref 0.1–1.0)
Monocytes Relative: 10 %
Neutro Abs: 4.6 10*3/uL (ref 1.7–7.7)
Neutrophils Relative %: 70 %
Platelets: 309 10*3/uL (ref 150–400)
RBC: 5.75 MIL/uL (ref 4.22–5.81)
RDW: 14.8 % (ref 11.5–15.5)
WBC: 6.6 10*3/uL (ref 4.0–10.5)
nRBC: 0 % (ref 0.0–0.2)

## 2021-10-28 LAB — COMPREHENSIVE METABOLIC PANEL
ALT: 28 U/L (ref 0–44)
AST: 56 U/L — ABNORMAL HIGH (ref 15–41)
Albumin: 4.4 g/dL (ref 3.5–5.0)
Alkaline Phosphatase: 82 U/L (ref 38–126)
Anion gap: 12 (ref 5–15)
BUN: 21 mg/dL (ref 8–23)
CO2: 22 mmol/L (ref 22–32)
Calcium: 9.1 mg/dL (ref 8.9–10.3)
Chloride: 102 mmol/L (ref 98–111)
Creatinine, Ser: 1.37 mg/dL — ABNORMAL HIGH (ref 0.61–1.24)
GFR, Estimated: 52 mL/min — ABNORMAL LOW (ref >60)
Glucose, Bld: 140 mg/dL — ABNORMAL HIGH (ref 70–99)
Potassium: 3.1 mmol/L — ABNORMAL LOW (ref 3.5–5.1)
Sodium: 136 mmol/L (ref 135–145)
Total Bilirubin: 1.2 mg/dL (ref 0.3–1.2)
Total Protein: 7.9 g/dL (ref 6.5–8.1)

## 2021-10-28 LAB — ACETAMINOPHEN LEVEL: Acetaminophen (Tylenol), Serum: <10 ug/mL — ABNORMAL LOW (ref 10–30)

## 2021-10-28 LAB — SALICYLATE LEVEL: Salicylate Lvl: <7 mg/dL — ABNORMAL LOW (ref 7.0–30.0)

## 2021-10-28 LAB — ETHANOL: Alcohol, Ethyl (B): <10 mg/dL (ref <10)

## 2021-10-28 MED ORDER — ASPIRIN 81 MG PO TBEC
81.0000 mg | DELAYED_RELEASE_TABLET | Freq: Every day | ORAL | Status: DC
  Administered 2021-10-30 – 2021-10-31 (×2): 81 mg via ORAL
  Filled 2021-10-28 (×2): qty 1

## 2021-10-28 MED ORDER — ATORVASTATIN CALCIUM 40 MG PO TABS
40.0000 mg | ORAL_TABLET | Freq: Every day | ORAL | Status: DC
  Administered 2021-10-30 – 2021-10-31 (×2): 40 mg via ORAL
  Filled 2021-10-28 (×2): qty 1

## 2021-10-28 MED ORDER — AMITRIPTYLINE HCL 10 MG PO TABS
10.0000 mg | ORAL_TABLET | Freq: Every day | ORAL | Status: DC
Start: 2021-10-28 — End: 2021-11-01
  Administered 2021-10-28 – 2021-10-30 (×2): 10 mg via ORAL
  Filled 2021-10-28 (×4): qty 1

## 2021-10-28 MED ORDER — PANTOPRAZOLE SODIUM 40 MG PO TBEC
40.0000 mg | DELAYED_RELEASE_TABLET | Freq: Every day | ORAL | Status: DC
  Administered 2021-10-30 – 2021-10-31 (×2): 40 mg via ORAL
  Filled 2021-10-28 (×2): qty 1

## 2021-10-28 MED ORDER — CARVEDILOL 3.125 MG PO TABS
3.1250 mg | ORAL_TABLET | Freq: Two times a day (BID) | ORAL | Status: DC
  Administered 2021-10-30 – 2021-10-31 (×2): 3.125 mg via ORAL
  Filled 2021-10-28 (×2): qty 1

## 2021-10-28 MED ORDER — GABAPENTIN 300 MG PO CAPS
300.0000 mg | ORAL_CAPSULE | Freq: Two times a day (BID) | ORAL | Status: DC
  Administered 2021-10-28 – 2021-10-31 (×4): 300 mg via ORAL
  Filled 2021-10-28 (×4): qty 1

## 2021-10-28 MED ORDER — MEMANTINE HCL 10 MG PO TABS
10.0000 mg | ORAL_TABLET | Freq: Every day | ORAL | Status: DC
  Administered 2021-10-30: 10 mg via ORAL
  Filled 2021-10-28 (×2): qty 1

## 2021-10-28 MED ORDER — GLIMEPIRIDE 1 MG PO TABS
1.0000 mg | ORAL_TABLET | Freq: Every day | ORAL | Status: DC
  Administered 2021-10-30: 1 mg via ORAL
  Filled 2021-10-28 (×4): qty 1
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
## 2021-10-28 NOTE — ED Notes (Signed)
Pt changed into purple scrubs, wanded by security. Belongings placed in locker 4

## 2021-10-28 NOTE — ED Notes (Signed)
Pt provided with [place] sandwich, crackers, soda, and water.

## 2021-10-29 NOTE — BH Assessment (Signed)
TTS clinician attempted to complete TTS assessment. Patient continued to fall asleep, drowsy and then refused to speak when Alexa, RN attempted to wake him up. Caryl Pina, RN updated. TTS will attempt at later time.

## 2021-10-29 NOTE — ED Notes (Signed)
Pt refusing to talk to TTS at this time. MD Karle Starch made aware.

## 2021-10-29 NOTE — ED Notes (Signed)
Completing TTS

## 2021-10-29 NOTE — ED Notes (Signed)
Pt refusing medication at this time

## 2021-10-29 NOTE — ED Notes (Signed)
Pt refused to have vital signs taken at this time.

## 2021-10-29 NOTE — ED Notes (Signed)
Patient redirected to go back into room and patient is refusing. MD made aware

## 2021-10-29 NOTE — ED Notes (Addendum)
Patient is sitting in room's doorway. Patient is verbally aggressive. Pt refusing meal tray that was provided. Refusing for RN to take VS and refusing to take medication. Pt is not IVC status at this time, pt is voluntary

## 2021-10-29 NOTE — ED Notes (Signed)
This RN attempting to contact TTS for patient dispo and evaluation. Remains on hold. Call unsuccessful

## 2021-10-29 NOTE — ED Notes (Signed)
Assumed care of patient.

## 2021-10-29 NOTE — ED Notes (Addendum)
Pt repeatedly yelling at staff, patients, and visitors that pass by room. Charge nurse notified and MD.

## 2021-10-29 NOTE — ED Notes (Signed)
Report received and care resumed. Pt resting on stretcher. Respirations unlabored. No distress noted. Will continue to monitor pt.

## 2021-10-29 NOTE — BH Assessment (Addendum)
Comprehensive Clinical Assessment (CCA) Note  10/29/2021 Juan [name] [id number] Disposition: Clinician discussed patient care with Erasmo Score, NP.  Patient is recommended for geropsych placement.  Clinician informed RN Andi Hence of disposition.  Patient is irritable and has refused most care while in [facility] for this episode.  He has poor eye contact and can be difficult to understand since he mumbles and speaks slurred.  Patient does not appear to be hearing things this time.  However when he was brought in he was brought in because of delusional thoughts that required police intervention.  Pt has a daughter that is trying to get him in a long term care facility.    Chief Complaint:  Chief Complaint  Patient presents with   Homicidal   Visit Diagnosis: Dementia    CCA Screening, Triage and Referral (STR)  Patient Reported Information How did you hear about [place]? Legal System  What Is the Reason for Your Visit/Call Today? [facility] was [facility] night by [employer].  He was brought in voluntarily. "Pt states that someone new moved into his apartment and he moved their clothes out and to the end of the hall. Pt states that he told them if they came back to his apartment he would stab them with a knife."  Pt says that this clinician was at his apartment last night in a police uniform (that is verifiably incorrect).  Patient either does not know why he is at the hospital or he is just being uncooperative.  He cannot say why he was brought to the hospital.  He turned up the volume on the television during the assessment.  After security got him to turn it off, he then waited until security left and pushed the cart away with his feet!  Pt refusing to speak to TTS.  This clinician did an assessment on patient on 10/16/21 and pt was more cooperative and coherent at that time.  How Long Has This Been Causing You Problems? 1 wk - 1 month  What Do You Feel Would Help You the Most Today? Treatment for  Depression or other mood problem   Have You Recently Had Any Thoughts About Hurting Yourself? No  Are You Planning to Commit Suicide/Harm Yourself At This time? No   Have you Recently Had Thoughts About [place]? Yes  Are You Planning to Harm Someone at This Time? No  Explanation: No data recorded  Have You Used Any Alcohol or Drugs in the Past 24 Hours? No  How Long Ago Did You Use Drugs or Alcohol? No data recorded What Did You Use and How Much? No data recorded  Do You Currently Have a Therapist/Psychiatrist? No  Name of Therapist/Psychiatrist: No data recorded  Have You Been Recently Discharged From Any Office Practice or Programs? No  Explanation of Discharge From Practice/Program: No data recorded    CCA Screening Triage Referral Assessment Type of Contact: Tele-Assessment  Telemedicine Service Delivery:   Is this Initial or Reassessment? Initial Assessment  Date Telepsych consult ordered in [facility]:  10/28/21  Time Telepsych consult ordered in [facility]:  2203  [facility] of Assessment[facility] ED  Provider Location: [facility] Assessment Services   Collateral Involvement: No data recorded  Does Patient Have a [place]? No data recorded Name and Contact of Legal Guardian: No data recorded If Minor and Not Living with Parent(s), Who has Custody? No data recorded Is CPS involved or ever been involved? Never  Is APS involved  or ever been involved? Never   Patient Determined To Be At Risk for Harm To Self or Others Based on Review of Patient Reported Information or Presenting Complaint? Yes, for Harm to Others  Method: Plan without intent  Availability of Means: No access or NA  Intent: Vague intent or NA  Notification Required: No need or identified person  Additional Information for Danger to Others Potential: No data recorded Additional Comments for Danger to Others Potential: Pt likely has dementia.  Are There Guns or Other Weapons  in [place]? No  Types of Guns/Weapons: No data recorded Are These Weapons Safely Secured?                            No data recorded Who Could Verify You Are Able To Have These Secured: No data recorded Do You Have any Outstanding Charges, Pending Court Dates, Parole/Probation? Unknown  Contacted To Inform of Risk of Harm To Self or Others: No data recorded   Does Patient Present under Involuntary Commitment? No  IVC Papers Initial File Date: 10/16/21   [place] of Residence: [place]   Patient Currently Receiving the Following Services: Not Receiving Services   Determination of Need: Urgent (48 hours)   Options For Referral: Inpatient Hospitalization (Geropsych placement recommended.)     CCA Biopsychosocial Patient Reported Schizophrenia/Schizoaffective Diagnosis in Past: No   Strengths: No data recorded  Mental Health Symptoms Depression:   Irritability   Duration of Depressive symptoms:  Duration of Depressive Symptoms: Greater than two weeks   Mania:   Change in energy/activity; Recklessness; Irritability   Anxiety:    Irritability; Difficulty concentrating   Psychosis:   Grossly disorganized or catatonic behavior   Duration of Psychotic symptoms:  Duration of Psychotic Symptoms: -- (Unknown)   Trauma:   None   Obsessions:   None   Compulsions:   None   Inattention:   None; Does not seem to listen   Hyperactivity/Impulsivity:   Feeling of restlessness   Oppositional/Defiant Behaviors:   Argumentative; Intentionally annoying; Defies rules; Temper   Emotional Irregularity:   Intense/inappropriate anger; Mood lability   Other Mood/Personality Symptoms:  No data recorded   Mental Status Exam Appearance and self-care  Stature:   Average   Weight:   Average weight   Clothing:   Casual   Grooming:   Neglected   Cosmetic use:   None   Posture/gait:   Stooped   Motor activity:   Restless   Sensorium  Attention:    Distractible; Inattentive; Confused   Concentration:   Anxiety interferes; Preoccupied; Variable; Scattered   Orientation:   Place; Person   Recall/memory:   Defective in Recent   Affect and Mood  Affect:   Negative   Mood:   Irritable; Negative   Relating  Eye contact:   Avoided   Facial expression:   Depressed   Attitude toward examiner:   Argumentative; Irritable; Uninterested   Thought and Language  Speech flow:  Flight of Ideas; Slurred; Soft   Thought content:   Suspicious; Persecutions   Preoccupation:   None   Hallucinations:   Other (Comment) (Unknown)   Organization:  No data recorded  [employer] of Knowledge:   Fair   Intelligence:   Average   Abstraction:   Popular   Judgement:   Impaired; Poor   Reality Testing:   Variable; Distorted   Insight:   Lacking; Poor; Unaware  Decision Making:   Confused   Social Functioning  Social Maturity:   Impulsive   Social Judgement:   Heedless; Impropriety   Stress  Stressors:   Family conflict; Housing; Museum/gallery curator; Relationship   Coping Ability:   Overwhelmed   Skill Deficits:   Activities of daily living; Communication; Decision making; Interpersonal; Self-control   Supports:   Friends/Service system; Family     Religion:    Leisure/Recreation:    Exercise/Diet: Exercise/Diet Have You Gained or Lost A Significant Amount of Weight in the Past Six Months?: No Do You Follow a Special Diet?: No Do You Have Any Trouble Sleeping?: Yes Explanation of Sleeping Difficulties: UTA   CCA Employment/Education Employment/Work Situation:    Education:     CCA Family/Childhood History Family and Relationship History:    Childhood History:     Child/Adolescent Assessment:     CCA Substance Use Alcohol/Drug Use:                           ASAM's:  Six Dimensions of Multidimensional Assessment  Dimension 1:  Acute Intoxication and/or  Withdrawal Potential:      Dimension 2:  Biomedical Conditions and Complications:      Dimension 3:  Emotional, Behavioral, or Cognitive Conditions and Complications:     Dimension 4:  Readiness to Change:     Dimension 5:  Relapse, Continued use, or Continued Problem Potential:     Dimension 6:  Recovery/Living Environment:     ASAM Severity Score:    ASAM Recommended Level of Treatment:     Substance use Disorder (SUD)    Recommendations for Services/Supports/Treatments:    Discharge Disposition:    DSM5 Diagnoses: Patient Active Problem List   Diagnosis Date Noted   Agitation 10/20/2021   GERD (gastroesophageal reflux disease) 08/21/2021   Peripheral arterial disease ([place]) 06/13/2021   Essential hypertension 07/30/2020   CAD in native artery 07/30/2020   Sciatica 07/30/2020   Chronic combined systolic and diastolic heart failure ([place]) 01/14/2020   Emphysema lung ([place]) 01/14/2020   Aortic atherosclerosis ([place]) 01/14/2020   Chest pain, rule out acute myocardial infarction 01/13/2020   DM2 (diabetes mellitus, type 2) ([place]) 01/13/2020   Osteoarthritis of left hip 07/22/2017   Iron deficiency anemia 05/17/2017   LBBB (left bundle branch block) 02/11/2016   Port catheter in place 12/25/2015   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   Esophageal cancer ([place]) 11/22/2015   Noise effect on both inner ears 08/29/2015   Right thyroid nodule 08/29/2015     Referrals to Alternative Service(s): Referred to Alternative Service(s):   Place:   Date:   Time:    Referred to Alternative Service(s):   Place:   Date:   Time:    Referred to Alternative Service(s):   Place:   Date:   Time:    Referred to Alternative Service(s):   Place:   Date:   Time:     Raymondo Band, LCASComprehensive Clinical Assessment (CCA) Screening, Triage and Referral Note  10/29/2021 Juan [name] [id number]  Chief Complaint:  Chief Complaint  Patient presents with    Homicidal   Visit Diagnosis: Dementia  Patient Reported Information How did you hear about [place]? Legal System  What Is the Reason for Your Visit/Call Today? [facility] was [facility] by [employer].  He was brought in voluntarily. "Pt states that someone new moved into his apartment and he moved  their clothes out and to the end of the hall. Pt states that he told them if they came back to his apartment he would stab them with a knife."  Pt says that this clinician was at his apartment last night in a police uniform (that is verifiably incorrect).  Patient either does not know why he is at the hospital or he is just being uncooperative.  He cannot say why he was brought to the hospital.  He turned up the volume on the television during the assessment.  After security got him to turn it off, he then waited until security left and pushed the cart away with his feet!  Pt refusing to speak to TTS.  This clinician did an assessment on patient on 10/16/21 and pt was more cooperative and coherent at that time.  How Long Has This Been Causing You Problems? 1 wk - 1 month  What Do You Feel Would Help You the Most Today? Treatment for Depression or other mood problem   Have You Recently Had Any Thoughts About Hurting Yourself? No  Are You Planning to Commit Suicide/Harm Yourself At This time? No   Have you Recently Had Thoughts About [place]? Yes  Are You Planning to Harm Someone at This Time? No  Explanation: No data recorded  Have You Used Any Alcohol or Drugs in the Past 24 Hours? No  How Long Ago Did You Use Drugs or Alcohol? No data recorded What Did You Use and How Much? No data recorded  Do You Currently Have a Therapist/Psychiatrist? No  Name of Therapist/Psychiatrist: No data recorded  Have You Been Recently Discharged From Any Office Practice or Programs? No  Explanation of Discharge From Practice/Program: No data recorded   CCA Screening Triage Referral  Assessment Type of Contact: Tele-Assessment  Telemedicine Service Delivery:   Is this Initial or Reassessment? Initial Assessment  Date Telepsych consult ordered in [facility]:  10/28/21  Time Telepsych consult ordered in [facility]:  2203  Location of Assessment: [facility] ED  Provider Location: [facility] Assessment Services   Collateral Involvement: No data recorded  Does Patient Have a [place]? No data recorded Name and Contact of Legal Guardian: No data recorded If Minor and Not Living with Parent(s), Who has Custody? No data recorded Is CPS involved or ever been involved? Never  Is APS involved or ever been involved? Never   Patient Determined To Be At Risk for Harm To Self or Others Based on Review of Patient Reported Information or Presenting Complaint? Yes, for Harm to Others  Method: Plan without intent  Availability of Means: No access or NA  Intent: Vague intent or NA  Notification Required: No need or identified person  Additional Information for Danger to Others Potential: No data recorded Additional Comments for Danger to Others Potential: Pt likely has dementia.  Are There Guns or Other Weapons in [place]? No  Types of Guns/Weapons: No data recorded Are These Weapons Safely Secured?                            No data recorded Who Could Verify You Are Able To Have These Secured: No data recorded Do You Have any Outstanding Charges, Pending Court Dates, Parole/Probation? Unknown  Contacted To Inform of Risk of Harm To Self or Others: No data recorded  Does Patient Present under Involuntary Commitment? No  IVC Papers Initial File Date: 10/16/21  [place] of Residence: [place]   Patient Currently Receiving the Following Services: Not Receiving Services   Determination of Need: Urgent (48 hours)   Options For Referral: Inpatient Hospitalization (Geropsych placement recommended.)   Discharge Disposition:     Raymondo Band,  LCAS

## 2021-10-29 NOTE — ED Notes (Signed)
Pt refused vitals at this time.

## 2021-10-29 NOTE — ED Notes (Signed)
Patient refused his vital check

## 2021-10-29 NOTE — ED Notes (Signed)
Pt ate approximately 1/2 of breakfast tray. Pt aware of home medications ordered. Pt refused medication. Pt aware of TTS consultation. Pt denies any needs. Pt HOB elevated.

## 2021-10-29 NOTE — ED Notes (Signed)
Pt provided sprite to drink at this time.

## 2021-10-29 NOTE — ED Notes (Signed)
Pt refusing medications at this time

## 2021-10-30 LAB — BASIC METABOLIC PANEL
Anion gap: 12 (ref 5–15)
BUN: 12 mg/dL (ref 8–23)
CO2: 25 mmol/L (ref 22–32)
Calcium: 9.1 mg/dL (ref 8.9–10.3)
Chloride: 98 mmol/L (ref 98–111)
Creatinine, Ser: 1.24 mg/dL (ref 0.61–1.24)
GFR, Estimated: 58 mL/min — ABNORMAL LOW (ref >60)
Glucose, Bld: 150 mg/dL — ABNORMAL HIGH (ref 70–99)
Potassium: 3.3 mmol/L — ABNORMAL LOW (ref 3.5–5.1)
Sodium: 135 mmol/L (ref 135–145)

## 2021-10-30 LAB — RAPID URINE DRUG SCREEN, HOSP PERFORMED
Amphetamines: NOT DETECTED
Barbiturates: NOT DETECTED
Benzodiazepines: NOT DETECTED
Cocaine: NOT DETECTED
Opiates: NOT DETECTED
Tetrahydrocannabinol: NOT DETECTED

## 2021-10-30 MED ORDER — LORAZEPAM 1 MG PO TABS: 1.0000 mg | ORAL_TABLET | ORAL | Status: DC | PRN

## 2021-10-30 MED ORDER — DIVALPROEX SODIUM 125 MG PO CSDR
125.0000 mg | DELAYED_RELEASE_CAPSULE | Freq: Two times a day (BID) | ORAL | Status: DC
  Administered 2021-10-30: 125 mg via ORAL
  Filled 2021-10-30 (×2): qty 1

## 2021-10-30 MED ORDER — ZIPRASIDONE MESYLATE 20 MG IM SOLR
10.0000 mg | INTRAMUSCULAR | Status: AC | PRN
  Administered 2021-10-31: 10 mg via INTRAMUSCULAR
  Filled 2021-10-30 (×2): qty 20

## 2021-10-30 MED ORDER — OLANZAPINE 5 MG PO TBDP: 10.0000 mg | ORAL_TABLET | Freq: Three times a day (TID) | ORAL | Status: DC | PRN

## 2021-10-30 MED ORDER — ZIPRASIDONE MESYLATE 20 MG IM SOLR
10.0000 mg | Freq: Once | INTRAMUSCULAR | Status: AC
  Administered 2021-10-30: 10 mg via INTRAMUSCULAR
  Filled 2021-10-30: qty 20

## 2021-10-30 MED ORDER — LORAZEPAM 1 MG PO TABS
1.0000 mg | ORAL_TABLET | Freq: Once | ORAL | Status: AC
  Administered 2021-10-30: 1 mg via ORAL
  Filled 2021-10-30: qty 1

## 2021-10-30 MED ORDER — STERILE WATER FOR INJECTION IJ SOLN
INTRAMUSCULAR | Status: AC
  Filled 2021-10-30: qty 10

## 2021-10-30 NOTE — ED Notes (Signed)
Pt standing out side of room in hallway. Pt asked to return to room. Pt replied "Juan [name] my dick." Security called.

## 2021-10-30 NOTE — ED Notes (Signed)
Pt agitated. RN deescalated pt and gave Pt given snacks and drinks.

## 2021-10-30 NOTE — Progress Notes (Signed)
Pt has been denied at [facility] due to pt's age. (Oldest pt can be 81 years old typically)  Pt denied at [place] due to insurance but have transferred referral to [facility] for review due to [facility] accepting gero pts with Medicaid.  CSW will assist and follow.  Benjaman Kindler, MSW, LCSWA 10/30/2021 11:18 PM

## 2021-10-30 NOTE — ED Notes (Signed)
Patient continues to come out of room and wander into hall. RN's redirected patient to get back into room and patient refusing. Security called to escort patient back to room

## 2021-10-30 NOTE — ED Notes (Signed)
Discussed with pt orders for more lab work and vitals. Pt refused a this time. Pt requested this nurse call his PCP. Offered pt to see his medical doctor. Pt refused.

## 2021-10-30 NOTE — ED Notes (Signed)
Pt at desk at this time. Pt reports being able to get labwork.

## 2021-10-30 NOTE — ED Notes (Signed)
Patient continues to refuses meal tray/ VS.

## 2021-10-30 NOTE — ED Notes (Signed)
Pt given snacks and drinks.

## 2021-10-30 NOTE — ED Notes (Signed)
Pt agitated and making verbal threats. Pt threatening to leave. RN spoke to MD and MD stated he is going to IVC pt.

## 2021-10-30 NOTE — ED Provider Notes (Signed)
Patient has been redirectable through the night.  All labs have now returned and are negative.  Patient medically clear for psychiatric treatment.   Orpah Greek, MD 10/30/21 [phone]

## 2021-10-30 NOTE — ED Notes (Signed)
Pt at nurses station at this time. PT aware of Coreg medication ordered. Pt refused medication. Pt states " I am fine. Don't you see?" Pt ambulating to and from room. Pt lying on stretcher without distress.

## 2021-10-30 NOTE — Progress Notes (Signed)
[place] Placement  Pt meets inpatient criteria per Merlyn Lot, NP.  Referral was sent to the following facilities;    Destination Service Provider Address Phone Fax  [facility]  [address] [place] [address] [phone]  [facility]  [place], [place] [address] [phone]  [facility]  [address] [place], [place] [address] [phone] [facility]  [place] [place] [address] [phone] [facility]  [address] [place] [address] [phone]  [facility]  [address], [place] [address] [phone] [place]  [address], [place] [address] [phone] [facility]  [address], [place] [address] [phone] [place]  [address] [place] [address] [phone] [facility]  [address] [place] [address] [phone] [facility]  [address] [place] [address] [phone]  [place] [address] [phone] [facility]  [place], [place] [address] [phone]   Situation ongoing,  CSW will follow up.   Benjaman Kindler, MSW, LCSWA 10/30/2021  @ 1:50 PM

## 2021-10-30 NOTE — ED Notes (Signed)
Offered pt a shower at this time and clean scrubs. Tech assisting pt.

## 2021-10-30 NOTE — ED Provider Notes (Signed)
Brief update note  81 year old gentleman who is awaiting placement by psychiatry.  Patient is now more agitated, threatening to leave.  Patient had expressed significant homicidal ideation, thoughts of hurting others.  He has been very agitated.  I believe he poses threat to others and will proceed with IVC at this time.  Have updated staff.  Will give dose of Geodon for now.   Lucrezia Starch, MD 10/30/21 2002

## 2021-10-30 NOTE — ED Notes (Signed)
Staffing called for sitter.  Juan [name] stated they currently do not have any sitters.

## 2021-10-31 DIAGNOSIS — R45850 Homicidal ideations: Secondary | ICD-10-CM

## 2021-10-31 MED ORDER — STERILE WATER FOR INJECTION IJ SOLN
INTRAMUSCULAR | Status: AC
  Filled 2021-10-31: qty 10

## 2021-10-31 MED ORDER — POTASSIUM CHLORIDE CRYS ER 20 MEQ PO TBCR
40.0000 meq | EXTENDED_RELEASE_TABLET | Freq: Once | ORAL | Status: DC
Start: 2021-10-31 — End: 2021-11-01

## 2021-10-31 NOTE — ED Provider Notes (Signed)
Patient transferred to psych center to Dr. Lavina Hamman.   Lennice Sites, DO 10/31/21 2100

## 2021-10-31 NOTE — ED Notes (Signed)
Pt ambulated to the bathroom.  

## 2021-10-31 NOTE — Discharge Instructions (Signed)
For your behavioral health needs you are advised to follow up with an outpatient psychiatrist.  Contact one of the following providers at your earliest opportunity to schedule an intake appointment:       Norma Fredrickson, MD      [facility]      [address]      [place], [place] [address]      [phone]       Leanord Hawking, MD      [facility]      [address] [place]      [place], [place] [address]      [phone]

## 2021-10-31 NOTE — BH Assessment (Signed)
[place] Assessment Progress Note   Per Merlyn Lot, NP, this pt does not require psychiatric hospitalization at this time.  Pt is psychiatrically cleared.  Discharge instructions include referrals for outpatient geriatric psychiatry providers.  Pt's nurses, Caryl Pina and [employer], have been notified.  Jalene Mullet, [place] Triage Specialist [phone]

## 2021-10-31 NOTE — ED Notes (Signed)
Patient moved chair into hallway in front of shower room. Unable to get patient to return to his room, refused to take any po medication. Security called for assistance with IM medication administration.

## 2021-10-31 NOTE — Progress Notes (Signed)
CSW Called [facility] that have a couple Gero beds and willing to review referral. [place] faxed referral to [place]. CSW/ CSW team will assist and follow.  Benjaman Kindler, MSW, LCSWA 10/31/2021 11:54 AM

## 2021-10-31 NOTE — Progress Notes (Signed)
Patient has been denied by [facility] due to no appropriate beds available. Patient meets [facility] inpatient criteria per Merlyn Lot, NP. Patient has been faxed out to the following facilities:   [facility] [place] [address] [phone]  [facility]  [place], [place] [address] [phone]  [facility]  [address] [place], [place] [address] [phone] [facility]  [place] [place] [address] [phone] [facility]  [address] [place] [address] [phone]  [facility]  [address] [place] [address] [phone] [place]  [address] [place] [address] [phone] [facility]  [address] [place] [address] [phone] [place]  [address] [place] [address] [phone] [facility]  [address] [place] [address] [facility]  [address] [place] [address] [phone]  [place] [address] [phone] [facility]  [place], [place] [address] [phone] [place], MSW, LCSW-A  9:41 PM 10/31/2021

## 2021-10-31 NOTE — ED Notes (Signed)
Patient continues to sit in chair in doorway speaking with staff. Mostly pleasant.

## 2021-10-31 NOTE — Consult Note (Signed)
Telepsych Consultation   Reason for Consult:  Psychiatric Reassessment Referring Physician:  Dr. Shirlyn Goltz Location of Patient:    [facility] Location of Provider: Other: virtual home office  Patient Identification: Juan [name] MRN:  [id number] Principal Diagnosis: Homicidal behavior Diagnosis:  Principal Problem:   Homicidal behavior Active Problems:   Behavior concern in adult   Total Time spent with patient: 30 minutes  Subjective:   Juan [name] is a 81 y.o. male patient admitted with worsening hallucinations and safety concerns.  HPI:   Pt states his name and location and states his understanding of events that led to current hospitalization.  Pt reports his landlord, put his clothes out and he was taken into police custody and brought to the hospital for being upset.  He states he has some memory problems but describes it as,"not that bad, I may forgot some things I've done throughout the day." Pt states he was keeping up with his responsibilities, rent "$875 monthly"  states he pays his own bills with his social security.  When asked about events that led to current hospitalization and if he brandished a knife, he states, "yes but states is was only to scare them, I would not hurt anyone."  He denies suicidal or homicidal ideations and denies hearing things or seeing things that are not there.   However, pt does have cognitive impairment, is pretty clear at present but per chart review, frequently has moments of confusion, and has hallucinations that can cause safety concerns. He states he has one daughter and 3 sons; daughter and son live in [place] and closest son lives in [place], [place].     Pt already evaluated for medical contribution to stated symptoms and medically cleared prior to psych assessment.   Per ED Provider Admission Assessment 10/28/2021:  History      Chief Complaint  Patient presents with   Homicidal      Juan [name] is a 81 y.o. male here with  homicidal ideation.  Patient has a history of dementia.  Patient apparently went home and thought that someone threw out his clothes and try to invade his house.  He apparently was grabbing his kitchen knife and was threatening the neighbors.  Police was called and a counselor brought him to the ED.    The history is provided by the patient.     Past Psychiatric History: as outlined above  Risk to Self:  no Risk to Others:  yes Prior Inpatient Therapy:  unknown Prior Outpatient Therapy:  unknown  Past Medical History:  Past Medical History:  Diagnosis Date   Aortic atherosclerosis (HCC)    Back pain    Barrett esophagus    Bleeding nose    CAD in native artery 07/30/2020   DDD (degenerative disc disease), thoracolumbar    Moderate to severe   Diabetes mellitus without complication ([place])    Diverticulosis    Esophageal cancer ([place]) dx'd 2017   Essential hypertension 07/30/2020   GERD (gastroesophageal reflux disease)    GERD (gastroesophageal reflux disease) 08/21/2021   Headache    Hearing loss    Right ear   History of umbilical hernia repair    Idiopathic peripheral neuropathy    Iron deficiency anemia    LBBB (left bundle branch block) 02/11/2016   Left hip pain    Severe degenerative changes   Lipoma of neck    Right   Mixed hyperlipidemia    PONV (postoperative nausea and vomiting)  Reflux    Right thyroid nodule    Sciatica 07/30/2020    Past Surgical History:  Procedure Laterality Date   BALLOON DILATION N/A 05/06/2018   Procedure: BALLOON DILATION;  Surgeon: Carol Ada, MD;  Location: [facility];  Service: Endoscopy;  Laterality: N/A;   COLONOSCOPY     ESOPHAGOGASTRODUODENOSCOPY (EGD) WITH PROPOFOL N/A 05/06/2018   Procedure: ESOPHAGOGASTRODUODENOSCOPY (EGD) WITH PROPOFOL;  Surgeon: Carol Ada, MD;  Location: [facility];  Service: Endoscopy;  Laterality: N/A;   EUS N/A 12/05/2015   Procedure: UPPER ENDOSCOPIC ULTRASOUND (EUS) LINEAR;  Surgeon: Carol Ada,  MD;  Location: [facility];  Service: Endoscopy;  Laterality: N/A;   EUS N/A 03/10/2016   Procedure: UPPER ENDOSCOPIC ULTRASOUND (EUS) LINEAR;  Surgeon: Carol Ada, MD;  Location: [facility];  Service: Endoscopy;  Laterality: N/A;   EUS N/A 01/07/2017   Procedure: UPPER ENDOSCOPIC ULTRASOUND (EUS) LINEAR;  Surgeon: Carol Ada, MD;  Location: [facility];  Service: Endoscopy;  Laterality: N/A;   HERNIA REPAIR     IR GENERIC HISTORICAL  12/24/2015   IR FLUORO GUIDE PORT INSERTION RIGHT 12/24/2015 Arne Cleveland, MD [facility]   IR GENERIC HISTORICAL  12/24/2015   IR US GUIDE VASC ACCESS RIGHT 12/24/2015 Arne Cleveland, MD [facility]   IR REMOVAL TUN ACCESS W/ PORT W/O FL MOD SED  02/07/2018   PORTA CATH INSERTION     RIGHT/LEFT HEART CATH AND CORONARY ANGIOGRAPHY N/A 01/15/2020   Procedure: RIGHT/LEFT HEART CATH AND CORONARY ANGIOGRAPHY;  Surgeon: Nelva Bush, MD;  Location: [facility];  Service: Cardiovascular;  Laterality: N/A;   SHOULDER ARTHROSCOPY W/ SUPERIOR LABRAL ANTERIOR POSTERIOR LESION REPAIR     times 2   TOTAL HIP ARTHROPLASTY Left 07/22/2017   Procedure: LEFT TOTAL HIP ARTHROPLASTY ANTERIOR APPROACH;  Surgeon: Rod Can, MD;  Location: [facility];  Service: Orthopedics;  Laterality: Left;   UPPER GI ENDOSCOPY  01/07/2017   Family History:  Family History  Problem Relation Age of Onset   Colon cancer Father 93   Colon cancer Brother 4   Heart attack Brother    Heart disease Mother    Heart attack Mother    Diabetes Sister    Stroke Sister    Family Psychiatric  History: unknown Social History:  Social History   Substance and Sexual Activity  Alcohol Use Not Currently     Social History   Substance and Sexual Activity  Drug Use No    Social History   Socioeconomic History   Marital status: Legally Separated    Spouse name: Not on file   Number of children: 9   Years of education: 4   Highest education level: Not on file   Occupational History   Not on file  Tobacco Use   Smoking status: Former    Packs/day: 2.00    Years: 40.00    Total pack years: 80.00    Types: Cigarettes    Quit date: 03/31/1999    Years since quitting: 22.6   Smokeless tobacco: Never  Vaping Use   Vaping Use: Never used  Substance and Sexual Activity   Alcohol use: Not Currently   Drug use: No   Sexual activity: Not on file  Other Topics Concern   Not on file  Social History Narrative   ** Merged History Encounter ** ** Merged History Encounter ** Married, wife [name]   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 and 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   Right handed   4 children  Wife and he is separate   Caffeine free   One story home   Social Determinants of Health   Financial Resource Strain: Not on file  Food Insecurity: Not on file  Transportation Needs: Not on file  Physical Activity: Not on file  Stress: Not on file  Social Connections: Not on file   Additional Social History:    Allergies:  No Known Allergies  Labs:  Results for orders placed or performed during the hospital encounter of 10/28/21 (from the past 48 hour(s))  Basic metabolic panel     Status: Abnormal   Collection Time: 10/30/21  6:11 PM  Result Value Ref Range   Sodium 135 135 - 145 mmol/L   Potassium 3.3 (L) 3.5 - 5.1 mmol/L   Chloride 98 98 - 111 mmol/L   CO2 25 22 - 32 mmol/L   Glucose, Bld 150 (H) 70 - 99 mg/dL    Comment: Glucose reference range applies only to samples taken after fasting for at least 8 hours.   BUN 12 8 - 23 mg/dL   Creatinine, Ser 1.24 0.61 - 1.24 mg/dL   Calcium 9.1 8.9 - 10.3 mg/dL   GFR, Estimated 58 (L) >60 mL/min    Comment: (NOTE) Calculated using the CKD-EPI Creatinine Equation (2021)    Anion gap 12 5 - 15    Comment: Performed at [facility] [address], [place], [place] [address]    Medications:  No current facility-administered medications for this encounter.   Current Outpatient Medications   Medication Sig Dispense Refill   amitriptyline (ELAVIL) 10 MG tablet Take 10 mg by mouth at bedtime. (Patient not taking: Reported on 10/29/2021)     aspirin EC 81 MG tablet Take 81 mg by mouth daily. (Patient not taking: Reported on 10/29/2021)     atorvastatin (LIPITOR) 40 MG tablet Take 1 tablet (40 mg total) by mouth daily. (Patient not taking: Reported on 10/29/2021) 90 tablet 3   carvedilol (COREG) 3.125 MG tablet Take 1 tablet (3.125 mg total) by mouth 2 (two) times daily with a meal. (Patient not taking: Reported on 10/29/2021) 180 tablet 2   cetirizine (ZYRTEC ALLERGY) 10 MG tablet Take 1 tablet (10 mg total) by mouth daily. (Patient not taking: Reported on 10/29/2021) 30 tablet 0   FARXIGA 5 MG TABS tablet Take 5 mg by mouth daily. (Patient not taking: Reported on 10/29/2021)     fluticasone (FLONASE) 50 MCG/ACT nasal spray Place 1 spray into both nostrils daily. (Patient not taking: Reported on 10/29/2021) 16 g 0   furosemide (LASIX) 20 MG tablet Take 1 tablet (20 mg total) by mouth daily. (Patient not taking: Reported on 10/29/2021) 30 tablet 0   gabapentin (NEURONTIN) 300 MG capsule Take 300 mg by mouth 2 (two) times daily. (Patient not taking: Reported on 10/29/2021)     glimepiride (AMARYL) 1 MG tablet Take 1 mg by mouth daily with breakfast. (Patient not taking: Reported on 10/29/2021)     memantine (NAMENDA) 10 MG tablet Take 1 tablet (10 mg at night) for 2 weeks, then increase to 1 tablet (10 mg) twice a day (Patient not taking: Reported on 10/29/2021) 60 tablet 11   nitroGLYCERIN (NITROSTAT) 0.4 MG SL tablet PLACE 1 TABLET (0.4 MG TOTAL) UNDER THE TONGUE EVERY FIVE MINUTES AS NEEDED FOR CHEST PAIN. (Patient not taking: Reported on 10/29/2021) 25 tablet 12   oxyCODONE-acetaminophen (PERCOCET) 10-325 MG tablet Take 1 tablet by mouth every 6 (six) hours as needed for  pain. (Patient not taking: Reported on 10/29/2021)     pantoprazole (PROTONIX) 40 MG tablet Take 1 tablet (40 mg total) by mouth daily before  breakfast. (Patient not taking: Reported on 10/29/2021) 90 tablet 1   sacubitril-valsartan (ENTRESTO) 24-26 MG Take 1 tablet by mouth 2 (two) times daily. (Patient not taking: Reported on 10/29/2021) 60 tablet 1   spironolactone (ALDACTONE) 25 MG tablet Take 1 tablet (25 mg total) by mouth daily. (Patient not taking: Reported on 10/29/2021) 90 tablet 3   tiZANidine (ZANAFLEX) 2 MG tablet Take 2 mg by mouth every 8 (eight) hours as needed for muscle spasms. (Patient not taking: Reported on 10/29/2021)      Musculoskeletal:pt moves all extremities and ambulates independently Strength & Muscle Tone: within normal limits Gait & Station: normal Patient leans: N/A  Psychiatric Specialty Exam:  Presentation  General Appearance: Appropriate for Environment; Casual  Eye Contact:Good  Speech:Clear and Coherent; Normal Rate  Speech Volume:Decreased  Handedness:Right   Mood and Affect  Mood:Euthymic  Affect:Appropriate; Congruent   Thought Process  Thought Processes:Coherent; Goal Directed  Descriptions of Associations:Circumstantial  Orientation:Full (Time, Place and Person)  Thought Content:Logical  History of Schizophrenia/Schizoaffective disorder:No  Duration of Psychotic Symptoms:N/A  Hallucinations:Hallucinations: None  Ideas of Reference:None  Suicidal Thoughts:Suicidal Thoughts: No  Homicidal Thoughts:Homicidal Thoughts: No   Sensorium  Memory:Immediate Good; Recent Good; Other (comment) (pt has dementia so prone to memory concerns)  Judgment:Fair  Insight:Fair   Executive Functions  Concentration:Good  Attention Span:Good  Sims  Language:Good   Psychomotor Activity  Psychomotor Activity:Psychomotor Activity: Normal   Assets  Assets:Communication Skills; Desire for Improvement   Sleep  Sleep:Sleep: Good Number of Hours of Sleep: 8    Physical Exam: Physical Exam Cardiovascular:     Rate and Rhythm: Normal  rate.     Pulses: Normal pulses.  Pulmonary:     Effort: Pulmonary effort is normal.  Musculoskeletal:     Cervical back: Normal range of motion.  Neurological:     Mental Status: He is alert and oriented to person, place, and time.  Psychiatric:        Attention and Perception: Attention normal. He does not perceive visual hallucinations.        Mood and Affect: Mood is depressed. Affect is blunt.        Speech: Speech normal.        Thought Content: Thought content is delusional.        Cognition and Memory: Memory normal. Cognition is impaired.        Judgment: Judgment is impulsive.    Review of Systems  Constitutional: Negative.   HENT: Negative.    Eyes: Negative.   Respiratory: Negative.    Cardiovascular: Negative.   Gastrointestinal: Negative.   Genitourinary: Negative.   Musculoskeletal: Negative.   Skin: Negative.   Neurological: Negative.   Endo/Heme/Allergies: Negative.   Psychiatric/Behavioral:  Positive for depression and memory loss.    Blood pressure 112/87, pulse 71, temperature 98.2 F (36.8 C), resp. rate 18, height '5\' 9"'$  (1.753 m), weight 88.5 kg, SpO2 95 %. Body mass index is 28.8 kg/m.  Treatment Plan Summary: Pt with hx dementia, with periodic behavioral disturbance. Prior ED visits eval for same. Unfortunately, this is not 'curable', pt should be expected to have episodic/periodic behavioral symptoms.  Per record review, pts daughter feels his hallucinations have increased and pt now pose a safety concern.  Considering this, pt would benefit form admissions where he can  be monitored for safety and given meds to help lessen agitation and reduce safety concerns.   Since admissions, patient has had intermittent episodes of agitation, verbally aggressive behaviors and limited impulse control.  He is eating/drinking, okay but intermittently becomes confused and refuses po medications.  Otherwise no acute distress. Patient was started on low dose depakote on  admission,levels are not therapeutic yet.  Prior to this, patient has not been adherent to medications to help manage his behaviors.  Based on above, will continue to seek geri psych inpatient admission but pt has dx for dementia which is typically an exclusionary criteria for most geri psych facilities.  If we cannot locate an accepting facility,will review with family next steps of discharging pt in their care and referral to long term care facility/memory unit.    Continue medications.  Disposition: No evidence of imminent risk to self or others at present.   Patient does not meet criteria for psychiatric inpatient admission. Supportive therapy provided about ongoing stressors. Discussed crisis plan, support from social network, calling 911, coming to the Emergency Department, and calling Suicide Hotline.  This service was provided via telemedicine using a 2-way, interactive audio and video technology.  Names of all persons participating in this telemedicine service and their role in this encounter. Name: Juan [name] Role: Patient  Name: Merlyn Lot Role: PMHNP    Mallie Darting, NP 11/01/2021 8:21 AM

## 2021-10-31 NOTE — ED Notes (Signed)
Per report, pt is accepted to [facility] [phone]). Report called by this RN, accepting physician Dr. Randa Evens.

## 2021-10-31 NOTE — ED Notes (Signed)
Patient sitting in the doorway of his room, making derogatory statements about staff and other patients. Patient has dementia and seems to think we are doing things to him at this time.

## 2021-10-31 NOTE — ED Notes (Signed)
Attempted to educate pt on importance of taking potassium pt stated "I got just enough potassium". Pt refusing all meds at this time.

## 2021-10-31 NOTE — Progress Notes (Signed)
CSW spoke with [place] at [facility] who reports that pt was denied due to noting that pt needs long term care. CSW shared that pt need inpatient. Tommie Sams advised to send a note stating this with a facesheet.     Benjaman Kindler, MSW, [facility] 10/31/2021 10:49 AM

## 2021-10-31 NOTE — ED Notes (Signed)
Patient left to [facility] with [facility] and all belongings transported with officer. Wheeled to [employer].

## 2021-10-31 NOTE — ED Notes (Signed)
Report given to Luellen Pucker, [facility] manager at [facility]

## 2021-10-31 NOTE — ED Notes (Signed)
Patient on TTS, allowed tech to take his vital signs this morning.

## 2021-11-01 DIAGNOSIS — R45850 Homicidal ideations: Secondary | ICD-10-CM

## 2021-11-01 DIAGNOSIS — F03918 Unspecified dementia, unspecified severity, with other behavioral disturbance: Secondary | ICD-10-CM | POA: Insufficient documentation

## 2021-11-01 DIAGNOSIS — F69 Unspecified disorder of adult personality and behavior: Secondary | ICD-10-CM

## 2021-11-03 ENCOUNTER — Other Ambulatory Visit: Payer: Self-pay | Admitting: Physician Assistant

## 2021-11-12 ENCOUNTER — Ambulatory Visit: Payer: Medicare HMO | Admitting: Physician Assistant

## 2021-12-02 ENCOUNTER — Encounter: Payer: Self-pay | Admitting: Hematology

## 2021-12-22 ENCOUNTER — Ambulatory Visit (HOSPITAL_BASED_OUTPATIENT_CLINIC_OR_DEPARTMENT_OTHER): Payer: Private Health Insurance - Indemnity | Admitting: Family

## 2022-04-15 ENCOUNTER — Telehealth: Payer: Self-pay | Admitting: Cardiovascular Disease

## 2022-04-15 MED ORDER — CARVEDILOL 3.125 MG PO TABS
3.1250 mg | ORAL_TABLET | Freq: Two times a day (BID) | ORAL | 1 refills | Status: AC
Start: 2022-04-15 — End: ?

## 2022-04-15 MED ORDER — ATORVASTATIN CALCIUM 40 MG PO TABS: 40.0000 mg | ORAL_TABLET | Freq: Every day | ORAL | 1 refills | Status: AC

## 2022-04-15 NOTE — Telephone Encounter (Signed)
Left detailed message that Rx has been sent to pharmacy, ok per [facility]

## 2022-04-15 NOTE — Telephone Encounter (Signed)
*  STAT* If patient is at the pharmacy, call can be transferred to refill team.   1. Which medications need to be refilled? (please list name of each medication and dose if known)  carvedilol (COREG) 3.125 MG tablet  atorvastatin (LIPITOR) 40 MG tablet    2. Which pharmacy/location (including street and city if local pharmacy) is medication to be sent to? [facility] on [address]   3. Do they need a 30 day or 90 day supply? 90 day   His pharmacy has changed to the [facility] on [address]

## 2022-04-15 NOTE — Telephone Encounter (Signed)
Pt's medications were sent to pt's pharmacy as requested. Confirmation received.  

## 2022-04-15 NOTE — Telephone Encounter (Signed)
Your welcome.

## 2022-04-15 NOTE — Telephone Encounter (Signed)
Pt c/o medication issue:  1. Name of Medication: carvedilol (COREG) 3.125 MG tablet   2. How are you currently taking this medication (dosage and times per day)?   3. Are you having a reaction (difficulty breathing--STAT)?   4. What is your medication issue? Pt states that this medication still has not been sent in to pharmacy. Requesting resend.

## 2022-04-22 ENCOUNTER — Telehealth: Payer: Self-pay

## 2022-04-22 NOTE — Telephone Encounter (Signed)
   Patient Name: Juan [name]  DOB: [birth year] MRN: [id number]  Primary Cardiologist: Skeet Latch, MD  Chart reviewed as part of pre-operative protocol coverage. Cataract extractions are recognized in guidelines as low risk surgeries that do not typically require specific preoperative testing or holding of blood thinner therapy. Therefore, given past medical history and time since last visit, based on ACC/AHA guidelines, [name] would be at acceptable risk for the planned procedure without further cardiovascular testing.   I will route this recommendation to the requesting party via Epic fax function and remove from pre-op pool.  Please call with questions.  Lenna Sciara, NP 04/22/2022, 3:40 PM

## 2022-04-22 NOTE — Telephone Encounter (Signed)
   Pre-operative Risk Assessment    Patient Name: Juan [name]  DOB: [birth year] MRN: [id number]      Request for Surgical Clearance    Procedure:   Cataract extraction with intraocular lens implantation of Left eye followed by the Right eye   Date of Surgery:  Clearance TBD                                 Surgeon:  Dr. Rolanda Jay Surgeon's Group or Practice Name:  [facility] Phone number:  [phone] Fax number:  [phone]   Type of Clearance Requested:   - Medical    Type of Anesthesia:   Topical anesthesia with IV medication   Additional requests/questions:    SignedJacqulynn Cadet   04/22/2022, 3:21 PM

## 2022-08-10 ENCOUNTER — Encounter: Payer: Self-pay | Admitting: Hematology

## 2022-08-17 ENCOUNTER — Ambulatory Visit (HOSPITAL_BASED_OUTPATIENT_CLINIC_OR_DEPARTMENT_OTHER): Payer: Medicare HMO | Admitting: Family

## 2022-08-29 ENCOUNTER — Other Ambulatory Visit (HOSPITAL_COMMUNITY): Payer: Self-pay

## 2022-10-03 ENCOUNTER — Other Ambulatory Visit: Payer: Self-pay | Admitting: Nurse Practitioner

## 2022-10-03 DIAGNOSIS — C154 Malignant neoplasm of middle third of esophagus: Secondary | ICD-10-CM

## 2022-10-03 NOTE — Progress Notes (Deleted)
Patient Care Team: Cushing, Rama Georgianne Fick), MD (Inactive) as PCP - General (Internal Medicine) Chilton Si, MD as PCP - Cardiology (Cardiology) Malachy Mood, MD as Consulting Physician (Hematology) Dorothy Puffer, MD as Consulting Physician (Radiation Oncology) Charna Elizabeth, MD as Consulting Physician (Gastroenterology)   CHIEF COMPLAINT: Follow up esophagus cancer   Oncology History Overview Note  Presented with dysphagia and odynophagia with 20-25 pounds of weight loss in about 3-4 months  Esophageal cancer [facility])   Staging form: Esophagus - Adenocarcinoma, AJCC 7th Edition   - Clinical stage from 11/13/2015: Stage IIIB (T3, N2, M0, G2) - Signed by Malachy Mood, MD on 12/11/2015    Esophageal cancer (HCC)  11/13/2015 Procedure   UPPER ENDOSCOPY per Dr. Loreta Ave: Large fungating, friable bleeding mass in middle third of esophagus, 22cm from incisors and extended to 27cm. Non-obstructing.   11/13/2015 Pathology Results   Invasive squamous cell carcinoma; moderately differentiated   11/13/2015 Imaging   CT ABD/PELVIS: IMPRESSION:No evidence of metastatic disease in the abdomen or pelvis. 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 Aortoiliac atherosclerosis. Small bilateral inguinal hernias containing fat the   11/22/2015 Initial Diagnosis   Esophageal cancer (HCC)   12/03/2015 Imaging   PET scan showed a long segment of hypermetabolic thickening in the mid esophagus consistent with esophageal carcinoma. No clear evidence of node metastasis or distant metastasis.   12/10/2015 - 01/14/2016 Radiation Therapy   Neoadjuvant radiation to his esophageal cancer   12/10/2015 - 01/15/2016 Chemotherapy   Neoadjuvant weekly carboplatin AUC 2, and Taxol 45 mg/m, with concurrent radiation. He developed steroid-induced psychosis, and Taxol was changed to Abraxane to avoid premedication with steroids.   02/24/2016 Imaging   CT CAP with contrast showed interval improvement mid esophageal mass, no  evidence for metastatic adenopathy or distant metastasis.    03/10/2016 Procedure   Repeat EGD by Dr. Elnoria Howard showed wall thickening in the thoracic esophagus with the tumor was. The thickness decreased from 12.8 mm to 6-7 mm. Not able to differentiate between actual tumor versus fibrosis from radiation. Some peritumoral shoddy lymph nodes.   04/07/2016 Surgery   Patient followed up with Dr. Tyrone Sage, patient is not a great candidate for surgery, pt also does not want surgery, mutually agreed to not pursue esophagectomy.    06/01/2016 Imaging   CT C/A/P IMPRESSION: Mild mid esophageal wall thickening without residual mass on CT. Radiation changes in the paramediastinal lung bilaterally. No evidence of recurrent or metastatic disease.   08/18/2016 -  Hospital Admission   Patient presented to the ER with SOB and complaints of chest pain   11/07/2016 Imaging   Nm Pet Image Restag IMPRESSION: Negative PET-CT. No findings for recurrent esophageal cancer or metastatic disease.   01/07/2017 Procedure   EUS by Dr. Elnoria Howard 01/07/17 IMPRESSION - Normal esophagus. - Normal stomach. - Normal examined duodenum. - Wall thickening was seen in the upper third of the esophagus and in the middle third of the esophagus. - One benign lymph node was visualized in the middle paraesophageal mediastinum (level 34M). - No specimens collected.   05/10/2017 Imaging   CT CAP W Contrast 05/10/17 IMPRESSION: 1. No new or progressive findings to suggest recurrent or metastatic disease in the chest or abdomen on today's study. 2.  Aortic Atherosclerois (ICD10-170.0)   01/14/2018 Imaging   01/14/2018 CT Neck IMPRESSION: 1. No evidence of cervical lymphadenopathy. 2. 5 cm right neck lipoma, mildly enlarged from 2010.   01/14/2018 Imaging   01/14/2018 CT CAP IMPRESSION: 1.  Interval development of several tiny bilateral pulmonary nodules. While indeterminate and potentially related to infectious/inflammatory  etiology, close attention recommended as early metastatic disease not excluded. 2. Otherwise stable exam. 3.  Emphysema. (ICD10-J43.9) 4.  Aortic Atherosclerois (ICD10-170.0)   01/25/2019 Imaging   CT AP W contrast  IMPRESSION: No evidence for residual or recurrent tumor within the abdomen or pelvis.     05/07/2019 Procedure   Upper Endoscopy by Dr Elnoria Howard  IMPRESSION - Benign-appearing esophageal stenosis. Dilated. - Normal stomach. - Normal examined duodenum. - No specimens collected.   01/13/2020 Imaging   CT CAP w Contrast  IMPRESSION: 1. Mild interlobular septal thickening and trace bilateral pleural effusions, consistent with pulmonary edema. 2. Mild centrilobular emphysema. Emphysema (ICD10-J43.9). 3. Diffuse bilateral bronchial wall thickening, consistent with nonspecific infectious or inflammatory bronchitis. 4. No evidence of esophageal mass or other specific evidence of malignancy on this examination. 5. Prominent mediastinal lymph nodes, unchanged compared to prior examination, nonspecific. 6. Coronary artery disease. Aortic Atherosclerosis (ICD10-I70.0). 7. Nonobstructive bilateral nephrolithiasis.        CURRENT THERAPY: Surveillance   INTERVAL HISTORY Mr. Mcguffey returns for follow up as scheduled, last seen by me 09/29/21.   ROS   Past Medical History:  Diagnosis Date   Aortic atherosclerosis (HCC)    Back pain    Barrett esophagus    Bleeding nose    CAD in native artery 07/30/2020   DDD (degenerative disc disease), thoracolumbar    Moderate to severe   Diabetes mellitus without complication (HCC)    Diverticulosis    Esophageal cancer (HCC) dx'd 2017   Essential hypertension 07/30/2020   GERD (gastroesophageal reflux disease)    GERD (gastroesophageal reflux disease) 08/21/2021   Headache    Hearing loss    Right ear   History of umbilical hernia repair    Idiopathic peripheral neuropathy    Iron deficiency anemia    LBBB (left bundle branch  block) 02/11/2016   Left hip pain    Severe degenerative changes   Lipoma of neck    Right   Mixed hyperlipidemia    PONV (postoperative nausea and vomiting)    Reflux    Right thyroid nodule    Sciatica 07/30/2020     Past Surgical History:  Procedure Laterality Date   BALLOON DILATION N/A 05/06/2018   Procedure: BALLOON DILATION;  Surgeon: Jeani Hawking, MD;  Location: [facility];  Service: Endoscopy;  Laterality: N/A;   COLONOSCOPY     ESOPHAGOGASTRODUODENOSCOPY (EGD) WITH PROPOFOL N/A 05/06/2018   Procedure: ESOPHAGOGASTRODUODENOSCOPY (EGD) WITH PROPOFOL;  Surgeon: Jeani Hawking, MD;  Location: [facility];  Service: Endoscopy;  Laterality: N/A;   EUS N/A 12/05/2015   Procedure: UPPER ENDOSCOPIC ULTRASOUND (EUS) LINEAR;  Surgeon: Jeani Hawking, MD;  Location: [facility];  Service: Endoscopy;  Laterality: N/A;   EUS N/A 03/10/2016   Procedure: UPPER ENDOSCOPIC ULTRASOUND (EUS) LINEAR;  Surgeon: Jeani Hawking, MD;  Location: [facility];  Service: Endoscopy;  Laterality: N/A;   EUS N/A 01/07/2017   Procedure: UPPER ENDOSCOPIC ULTRASOUND (EUS) LINEAR;  Surgeon: Jeani Hawking, MD;  Location: [facility] ENDOSCOPY;  Service: Endoscopy;  Laterality: N/A;   HERNIA REPAIR     IR GENERIC HISTORICAL  12/24/2015   IR FLUORO GUIDE PORT INSERTION RIGHT 12/24/2015 Oley Balm, MD [facility]   IR GENERIC HISTORICAL  12/24/2015   IR US GUIDE VASC ACCESS RIGHT 12/24/2015 Oley Balm, MD [facility]   IR REMOVAL TUN ACCESS W/ PORT W/O FL MOD SED  02/07/2018  PORTA CATH INSERTION     RIGHT/LEFT HEART CATH AND CORONARY ANGIOGRAPHY N/A 01/15/2020   Procedure: RIGHT/LEFT HEART CATH AND CORONARY ANGIOGRAPHY;  Surgeon: Yvonne Kendall, MD;  Location: [facility];  Service: Cardiovascular;  Laterality: N/A;   SHOULDER ARTHROSCOPY W/ SUPERIOR LABRAL ANTERIOR POSTERIOR LESION REPAIR     times 2   TOTAL HIP ARTHROPLASTY Left 07/22/2017   Procedure: LEFT TOTAL HIP ARTHROPLASTY ANTERIOR APPROACH;   Surgeon: Samson Frederic, MD;  Location: [facility];  Service: Orthopedics;  Laterality: Left;   UPPER GI ENDOSCOPY  01/07/2017     Outpatient Encounter Medications as of 10/05/2022  Medication Sig Note   amitriptyline (ELAVIL) 10 MG tablet Take 10 mg by mouth at bedtime. (Patient not taking: Reported on 10/29/2021)    aspirin EC 81 MG tablet Take 81 mg by mouth daily. (Patient not taking: Reported on 10/29/2021)    atorvastatin (LIPITOR) 40 MG tablet Take 1 tablet (40 mg total) by mouth daily.    carvedilol (COREG) 3.125 MG tablet Take 1 tablet (3.125 mg total) by mouth 2 (two) times daily with a meal.    cetirizine (ZYRTEC ALLERGY) 10 MG tablet Take 1 tablet (10 mg total) by mouth daily. (Patient not taking: Reported on 10/29/2021)    FARXIGA 5 MG TABS tablet Take 5 mg by mouth daily. (Patient not taking: Reported on 10/29/2021)    fluticasone (FLONASE) 50 MCG/ACT nasal spray Place 1 spray into both nostrils daily. (Patient not taking: Reported on 10/29/2021)    furosemide (LASIX) 20 MG tablet Take 1 tablet (20 mg total) by mouth daily. (Patient not taking: Reported on 10/29/2021)    gabapentin (NEURONTIN) 300 MG capsule Take 300 mg by mouth 2 (two) times daily. (Patient not taking: Reported on 10/29/2021)    glimepiride (AMARYL) 1 MG tablet Take 1 mg by mouth daily with breakfast. (Patient not taking: Reported on 10/29/2021)    memantine (NAMENDA) 10 MG tablet TAKE 1 TABLET (10 MG AT NIGHT) FOR 2 WEEKS, THEN INCREASE TO 1 TABLET (10 MG) TWICE A DAY    nitroGLYCERIN (NITROSTAT) 0.4 MG SL tablet PLACE 1 TABLET (0.4 MG TOTAL) UNDER THE TONGUE EVERY FIVE MINUTES AS NEEDED FOR CHEST PAIN. (Patient not taking: Reported on 10/29/2021)    oxyCODONE-acetaminophen (PERCOCET) 10-325 MG tablet Take 1 tablet by mouth every 6 (six) hours as needed for pain. (Patient not taking: Reported on 10/29/2021)    pantoprazole (PROTONIX) 40 MG tablet Take 1 tablet (40 mg total) by mouth daily before breakfast. (Patient not taking: Reported on  10/29/2021)    sacubitril-valsartan (ENTRESTO) 24-26 MG Take 1 tablet by mouth 2 (two) times daily. (Patient not taking: Reported on 10/29/2021) 10/29/2021: No record of this being filled in past year   spironolactone (ALDACTONE) 25 MG tablet Take 1 tablet (25 mg total) by mouth daily. (Patient not taking: Reported on 10/29/2021)    tiZANidine (ZANAFLEX) 2 MG tablet Take 2 mg by mouth every 8 (eight) hours as needed for muscle spasms. (Patient not taking: Reported on 10/29/2021)    No facility-administered encounter medications on file as of 10/05/2022.     There were no vitals filed for this visit. There is no height or weight on file to calculate BMI.   PHYSICAL EXAM GENERAL:alert, no distress and comfortable SKIN: no rash  EYES: sclera clear NECK: without mass LYMPH:  no palpable cervical or supraclavicular lymphadenopathy  LUNGS: clear with normal breathing effort HEART: regular rate & rhythm, no lower extremity edema ABDOMEN: abdomen  soft, non-tender and normal bowel sounds NEURO: alert & oriented x 3 with fluent speech, no focal motor/sensory deficits Breast exam:  PAC without erythema    CBC    Component Value Date/Time   WBC 6.6 10/28/2021 2235   RBC 5.75 10/28/2021 2235   HGB 13.7 10/28/2021 2235   HGB 13.9 05/13/2021 1130   HGB 10.9 (L) 03/15/2017 0920   HCT 44.3 10/28/2021 2235   HCT 45.4 05/13/2021 1130   HCT 34.9 (L) 03/15/2017 0920   PLT 309 10/28/2021 2235   PLT 283 05/13/2021 1130   MCV 77.0 (L) 10/28/2021 2235   MCV 77 (L) 05/13/2021 1130   MCV 73.1 (L) 03/15/2017 0920   MCH 23.8 (L) 10/28/2021 2235   MCHC 30.9 10/28/2021 2235   RDW 14.8 10/28/2021 2235   RDW 16.4 (H) 05/13/2021 1130   RDW 15.6 (H) 03/15/2017 0920   LYMPHSABS 1.3 10/28/2021 2235   LYMPHSABS 0.8 (L) 03/15/2017 0920   MONOABS 0.7 10/28/2021 2235   MONOABS 0.3 03/15/2017 0920   EOSABS 0.0 10/28/2021 2235   EOSABS 0.1 03/15/2017 0920   BASOSABS 0.0 10/28/2021 2235   BASOSABS 0.0 03/15/2017 0920      CMP     Component Value Date/Time   NA 135 10/30/2021 1811   NA 133 (L) 08/21/2021 1001   NA 139 03/15/2017 0920   K 3.3 (L) 10/30/2021 1811   K 3.7 03/15/2017 0920   CL 98 10/30/2021 1811   CO2 25 10/30/2021 1811   CO2 24 03/15/2017 0920   GLUCOSE 150 (H) 10/30/2021 1811   GLUCOSE 111 03/15/2017 0920   BUN 12 10/30/2021 1811   BUN 7 (L) 08/21/2021 1001   BUN 6.8 (L) 03/15/2017 0920   CREATININE 1.24 10/30/2021 1811   CREATININE 0.8 03/15/2017 0920   CALCIUM 9.1 10/30/2021 1811   CALCIUM 8.7 03/15/2017 0920   PROT 7.9 10/28/2021 2235   PROT 7.4 08/21/2021 1001   PROT 6.9 03/15/2017 0920   ALBUMIN 4.4 10/28/2021 2235   ALBUMIN 4.5 08/21/2021 1001   ALBUMIN 3.8 03/15/2017 0920   AST 56 (H) 10/28/2021 2235   AST 43 (H) 03/15/2017 0920   ALT 28 10/28/2021 2235   ALT 25 03/15/2017 0920   ALKPHOS 82 10/28/2021 2235   ALKPHOS 68 03/15/2017 0920   BILITOT 1.2 10/28/2021 2235   BILITOT 0.3 08/21/2021 1001   BILITOT 0.50 03/15/2017 0920   GFRNONAA 58 (L) 10/30/2021 1811   GFRAA >60 07/22/2019 2223     ASSESSMENT & PLAN:82 y.o. male    1. 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  Staging work-up was negative for distant metastasis  -He was treated with concurrent chemo RT with weekly carbo and Taxol.  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 Tyrone Sage mutually agreed not to pursue esophagectomy due to his advanced age and general health condition -Posttreatment EGD and subsequent scans showed no evidence of disease -Completed 5 years of surveillance in 2022, continues long term surveillance with [place]   2.  Intermittent anemia  -Mild, stable.  No anemia 04/2021 -He has had recurrent anemia today, Hgb 12.8, MCV 74; no other cytopenias -Denies bleeding   3.  Memory -In chart review  ED notes mention daughter's concern for dementia, he has neurology consult 10/07/2021  PLAN:  No orders of the defined types  were placed in this encounter.     All questions were answered. The patient knows to call the clinic with any problems, questions or concerns. No barriers to learning were detected. I spent *** counseling the patient face to face. The total time spent in the appointment was *** and more than 50% was on counseling, review of test results, and coordination of care.   Santiago Glad, NP-C @DATE @

## 2022-10-05 ENCOUNTER — Inpatient Hospital Stay: Payer: Medicare HMO | Attending: Nurse Practitioner | Admitting: Nurse Practitioner

## 2022-10-05 ENCOUNTER — Inpatient Hospital Stay: Payer: Medicare HMO

## 2022-10-16 ENCOUNTER — Telehealth: Payer: Self-pay | Admitting: Nurse Practitioner

## 2022-10-20 ENCOUNTER — Inpatient Hospital Stay: Payer: Medicare HMO

## 2022-10-20 ENCOUNTER — Inpatient Hospital Stay: Payer: Medicare HMO | Admitting: Nurse Practitioner

## 2022-10-20 NOTE — Progress Notes (Deleted)
Patient Care Team: [place], Rama Georgianne Fick), MD (Inactive) as PCP - General (Internal Medicine) Chilton Si, MD as PCP - Cardiology (Cardiology) Malachy Mood, MD as Consulting Physician (Hematology) Dorothy Puffer, MD as Consulting Physician (Radiation Oncology) Charna Elizabeth, MD as Consulting Physician (Gastroenterology)   CHIEF COMPLAINT: Follow up esophageal cancer   Oncology History Overview Note  Presented with dysphagia and odynophagia with 20-25 pounds of weight loss in about 3-4 months  Esophageal cancer [facility])   Staging form: Esophagus - Adenocarcinoma, AJCC 7th Edition   - Clinical stage from 11/13/2015: Stage IIIB (T3, N2, M0, G2) - Signed by Malachy Mood, MD on 12/11/2015    Esophageal cancer (HCC)  11/13/2015 Procedure   UPPER ENDOSCOPY per Dr. Loreta Ave: Large fungating, friable bleeding mass in middle third of esophagus, 22cm from incisors and extended to 27cm. Non-obstructing.   11/13/2015 Pathology Results   Invasive squamous cell carcinoma; moderately differentiated   11/13/2015 Imaging   CT ABD/PELVIS: IMPRESSION:No evidence of metastatic disease in the abdomen or pelvis. 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 Aortoiliac atherosclerosis. Small bilateral inguinal hernias containing fat the   11/22/2015 Initial Diagnosis   Esophageal cancer (HCC)   12/03/2015 Imaging   PET scan showed a long segment of hypermetabolic thickening in the mid esophagus consistent with esophageal carcinoma. No clear evidence of node metastasis or distant metastasis.   12/10/2015 - 01/14/2016 Radiation Therapy   Neoadjuvant radiation to his esophageal cancer   12/10/2015 - 01/15/2016 Chemotherapy   Neoadjuvant weekly carboplatin AUC 2, and Taxol 45 mg/m, with concurrent radiation. He developed steroid-induced psychosis, and Taxol was changed to Abraxane to avoid premedication with steroids.   02/24/2016 Imaging   CT CAP with contrast showed interval improvement mid esophageal mass, no  evidence for metastatic adenopathy or distant metastasis.    03/10/2016 Procedure   Repeat EGD by Dr. Elnoria Howard showed wall thickening in the thoracic esophagus with the tumor was. The thickness decreased from 12.8 mm to 6-7 mm. Not able to differentiate between actual tumor versus fibrosis from radiation. Some peritumoral shoddy lymph nodes.   04/07/2016 Surgery   Patient followed up with Dr. Tyrone Sage, patient is not a great candidate for surgery, pt also does not want surgery, mutually agreed to not pursue esophagectomy.    06/01/2016 Imaging   CT C/A/P IMPRESSION: Mild mid esophageal wall thickening without residual mass on CT. Radiation changes in the paramediastinal lung bilaterally. No evidence of recurrent or metastatic disease.   08/18/2016 -  Hospital Admission   Patient presented to the ER with SOB and complaints of chest pain   11/07/2016 Imaging   Nm Pet Image Restag IMPRESSION: Negative PET-CT. No findings for recurrent esophageal cancer or metastatic disease.   01/07/2017 Procedure   EUS by Dr. Elnoria Howard 01/07/17 IMPRESSION - Normal esophagus. - Normal stomach. - Normal examined duodenum. - Wall thickening was seen in the upper third of the esophagus and in the middle third of the esophagus. - One benign lymph node was visualized in the middle paraesophageal mediastinum (level 46M). - No specimens collected.   05/10/2017 Imaging   CT CAP W Contrast 05/10/17 IMPRESSION: 1. No new or progressive findings to suggest recurrent or metastatic disease in the chest or abdomen on today's study. 2.  Aortic Atherosclerois (ICD10-170.0)   01/14/2018 Imaging   01/14/2018 CT Neck IMPRESSION: 1. No evidence of cervical lymphadenopathy. 2. 5 cm right neck lipoma, mildly enlarged from 2010.   01/14/2018 Imaging   01/14/2018 CT CAP IMPRESSION: 1.  Interval development of several tiny bilateral pulmonary nodules. While indeterminate and potentially related to infectious/inflammatory  etiology, close attention recommended as early metastatic disease not excluded. 2. Otherwise stable exam. 3.  Emphysema. (ICD10-J43.9) 4.  Aortic Atherosclerois (ICD10-170.0)   01/25/2019 Imaging   CT AP W contrast  IMPRESSION: No evidence for residual or recurrent tumor within the abdomen or pelvis.     05/07/2019 Procedure   Upper Endoscopy by Dr Elnoria Howard  IMPRESSION - Benign-appearing esophageal stenosis. Dilated. - Normal stomach. - Normal examined duodenum. - No specimens collected.   01/13/2020 Imaging   CT CAP w Contrast  IMPRESSION: 1. Mild interlobular septal thickening and trace bilateral pleural effusions, consistent with pulmonary edema. 2. Mild centrilobular emphysema. Emphysema (ICD10-J43.9). 3. Diffuse bilateral bronchial wall thickening, consistent with nonspecific infectious or inflammatory bronchitis. 4. No evidence of esophageal mass or other specific evidence of malignancy on this examination. 5. Prominent mediastinal lymph nodes, unchanged compared to prior examination, nonspecific. 6. Coronary artery disease. Aortic Atherosclerosis (ICD10-I70.0). 7. Nonobstructive bilateral nephrolithiasis.        CURRENT THERAPY: Surveillance   INTERVAL HISTORY Juan [name] returns for follow up as scheduled. Last seen by me (virtually) 09/29/21  ROS   Past Medical History:  Diagnosis Date   Aortic atherosclerosis (HCC)    Back pain    Barrett esophagus    Bleeding nose    CAD in native artery 07/30/2020   DDD (degenerative disc disease), thoracolumbar    Moderate to severe   Diabetes mellitus without complication (HCC)    Diverticulosis    Esophageal cancer (HCC) dx'd 2017   Essential hypertension 07/30/2020   GERD (gastroesophageal reflux disease)    GERD (gastroesophageal reflux disease) 08/21/2021   Headache    Hearing loss    Right ear   History of umbilical hernia repair    Idiopathic peripheral neuropathy    Iron deficiency anemia    LBBB (left bundle  branch block) 02/11/2016   Left hip pain    Severe degenerative changes   Lipoma of neck    Right   Mixed hyperlipidemia    PONV (postoperative nausea and vomiting)    Reflux    Right thyroid nodule    Sciatica 07/30/2020     Past Surgical History:  Procedure Laterality Date   BALLOON DILATION N/A 05/06/2018   Procedure: BALLOON DILATION;  Surgeon: Jeani Hawking, MD;  Location: [facility] ENDOSCOPY;  Service: Endoscopy;  Laterality: N/A;   COLONOSCOPY     ESOPHAGOGASTRODUODENOSCOPY (EGD) WITH PROPOFOL N/A 05/06/2018   Procedure: ESOPHAGOGASTRODUODENOSCOPY (EGD) WITH PROPOFOL;  Surgeon: Jeani Hawking, MD;  Location: [facility] ENDOSCOPY;  Service: Endoscopy;  Laterality: N/A;   EUS N/A 12/05/2015   Procedure: UPPER ENDOSCOPIC ULTRASOUND (EUS) LINEAR;  Surgeon: Jeani Hawking, MD;  Location: [facility] ENDOSCOPY;  Service: Endoscopy;  Laterality: N/A;   EUS N/A 03/10/2016   Procedure: UPPER ENDOSCOPIC ULTRASOUND (EUS) LINEAR;  Surgeon: Jeani Hawking, MD;  Location: [facility] ENDOSCOPY;  Service: Endoscopy;  Laterality: N/A;   EUS N/A 01/07/2017   Procedure: UPPER ENDOSCOPIC ULTRASOUND (EUS) LINEAR;  Surgeon: Jeani Hawking, MD;  Location: [facility] ENDOSCOPY;  Service: Endoscopy;  Laterality: N/A;   HERNIA REPAIR     IR GENERIC HISTORICAL  12/24/2015   IR FLUORO GUIDE PORT INSERTION RIGHT 12/24/2015 Oley Balm, MD [facility]INTER[facility]   IR GENERIC HISTORICAL  12/24/2015   IR US GUIDE VASC ACCESS RIGHT 12/24/2015 Oley Balm, MD [facility]   IR REMOVAL TUN ACCESS W/ PORT W/O FL MOD SED  02/07/2018  PORTA CATH INSERTION     RIGHT/LEFT HEART CATH AND CORONARY ANGIOGRAPHY N/A 01/15/2020   Procedure: RIGHT/LEFT HEART CATH AND CORONARY ANGIOGRAPHY;  Surgeon: Yvonne Kendall, MD;  Location: [facility] INVASIVE CV LAB;  Service: Cardiovascular;  Laterality: N/A;   SHOULDER ARTHROSCOPY W/ SUPERIOR LABRAL ANTERIOR POSTERIOR LESION REPAIR     times 2   TOTAL HIP ARTHROPLASTY Left 07/22/2017   Procedure: LEFT TOTAL HIP ARTHROPLASTY ANTERIOR APPROACH;   Surgeon: Samson Frederic, MD;  Location: [facility] ORS;  Service: Orthopedics;  Laterality: Left;   UPPER GI ENDOSCOPY  01/07/2017     Outpatient Encounter Medications as of 10/20/2022  Medication Sig Note   amitriptyline (ELAVIL) 10 MG tablet Take 10 mg by mouth at bedtime. (Patient not taking: Reported on 10/29/2021)    aspirin EC 81 MG tablet Take 81 mg by mouth daily. (Patient not taking: Reported on 10/29/2021)    atorvastatin (LIPITOR) 40 MG tablet Take 1 tablet (40 mg total) by mouth daily.    carvedilol (COREG) 3.125 MG tablet Take 1 tablet (3.125 mg total) by mouth 2 (two) times daily with a meal.    cetirizine (ZYRTEC ALLERGY) 10 MG tablet Take 1 tablet (10 mg total) by mouth daily. (Patient not taking: Reported on 10/29/2021)    FARXIGA 5 MG TABS tablet Take 5 mg by mouth daily. (Patient not taking: Reported on 10/29/2021)    fluticasone (FLONASE) 50 MCG/ACT nasal spray Place 1 spray into both nostrils daily. (Patient not taking: Reported on 10/29/2021)    furosemide (LASIX) 20 MG tablet Take 1 tablet (20 mg total) by mouth daily. (Patient not taking: Reported on 10/29/2021)    gabapentin (NEURONTIN) 300 MG capsule Take 300 mg by mouth 2 (two) times daily. (Patient not taking: Reported on 10/29/2021)    glimepiride (AMARYL) 1 MG tablet Take 1 mg by mouth daily with breakfast. (Patient not taking: Reported on 10/29/2021)    memantine (NAMENDA) 10 MG tablet TAKE 1 TABLET (10 MG AT NIGHT) FOR 2 WEEKS, THEN INCREASE TO 1 TABLET (10 MG) TWICE A DAY    nitroGLYCERIN (NITROSTAT) 0.4 MG SL tablet PLACE 1 TABLET (0.4 MG TOTAL) UNDER THE TONGUE EVERY FIVE MINUTES AS NEEDED FOR CHEST PAIN. (Patient not taking: Reported on 10/29/2021)    oxyCODONE-acetaminophen (PERCOCET) 10-325 MG tablet Take 1 tablet by mouth every 6 (six) hours as needed for pain. (Patient not taking: Reported on 10/29/2021)    pantoprazole (PROTONIX) 40 MG tablet Take 1 tablet (40 mg total) by mouth daily before breakfast. (Patient not taking: Reported  on 10/29/2021)    sacubitril-valsartan (ENTRESTO) 24-26 MG Take 1 tablet by mouth 2 (two) times daily. (Patient not taking: Reported on 10/29/2021) 10/29/2021: No record of this being filled in past year   spironolactone (ALDACTONE) 25 MG tablet Take 1 tablet (25 mg total) by mouth daily. (Patient not taking: Reported on 10/29/2021)    tiZANidine (ZANAFLEX) 2 MG tablet Take 2 mg by mouth every 8 (eight) hours as needed for muscle spasms. (Patient not taking: Reported on 10/29/2021)    No facility-administered encounter medications on file as of 10/20/2022.     There were no vitals filed for this visit. There is no height or weight on file to calculate BMI.   PHYSICAL EXAM GENERAL:alert, no distress and comfortable SKIN: no rash  EYES: sclera clear NECK: without mass LYMPH:  no palpable cervical or supraclavicular lymphadenopathy  LUNGS: clear with normal breathing effort HEART: regular rate & rhythm, no lower extremity edema ABDOMEN: abdomen  soft, non-tender and normal bowel sounds NEURO: alert & oriented x 3 with fluent speech, no focal motor/sensory deficits Breast exam:  PAC without erythema    CBC    Component Value Date/Time   WBC 6.6 10/28/2021 2235   RBC 5.75 10/28/2021 2235   HGB 13.7 10/28/2021 2235   HGB 13.9 05/13/2021 1130   HGB 10.9 (L) 03/15/2017 0920   HCT 44.3 10/28/2021 2235   HCT 45.4 05/13/2021 1130   HCT 34.9 (L) 03/15/2017 0920   PLT 309 10/28/2021 2235   PLT 283 05/13/2021 1130   MCV 77.0 (L) 10/28/2021 2235   MCV 77 (L) 05/13/2021 1130   MCV 73.1 (L) 03/15/2017 0920   MCH 23.8 (L) 10/28/2021 2235   MCHC 30.9 10/28/2021 2235   RDW 14.8 10/28/2021 2235   RDW 16.4 (H) 05/13/2021 1130   RDW 15.6 (H) 03/15/2017 0920   LYMPHSABS 1.3 10/28/2021 2235   LYMPHSABS 0.8 (L) 03/15/2017 0920   MONOABS 0.7 10/28/2021 2235   MONOABS 0.3 03/15/2017 0920   EOSABS 0.0 10/28/2021 2235   EOSABS 0.1 03/15/2017 0920   BASOSABS 0.0 10/28/2021 2235   BASOSABS 0.0 03/15/2017  0920     CMP     Component Value Date/Time   NA 135 10/30/2021 1811   NA 133 (L) 08/21/2021 1001   NA 139 03/15/2017 0920   K 3.3 (L) 10/30/2021 1811   K 3.7 03/15/2017 0920   CL 98 10/30/2021 1811   CO2 25 10/30/2021 1811   CO2 24 03/15/2017 0920   GLUCOSE 150 (H) 10/30/2021 1811   GLUCOSE 111 03/15/2017 0920   BUN 12 10/30/2021 1811   BUN 7 (L) 08/21/2021 1001   BUN 6.8 (L) 03/15/2017 0920   CREATININE 1.24 10/30/2021 1811   CREATININE 0.8 03/15/2017 0920   CALCIUM 9.1 10/30/2021 1811   CALCIUM 8.7 03/15/2017 0920   PROT 7.9 10/28/2021 2235   PROT 7.4 08/21/2021 1001   PROT 6.9 03/15/2017 0920   ALBUMIN 4.4 10/28/2021 2235   ALBUMIN 4.5 08/21/2021 1001   ALBUMIN 3.8 03/15/2017 0920   AST 56 (H) 10/28/2021 2235   AST 43 (H) 03/15/2017 0920   ALT 28 10/28/2021 2235   ALT 25 03/15/2017 0920   ALKPHOS 82 10/28/2021 2235   ALKPHOS 68 03/15/2017 0920   BILITOT 1.2 10/28/2021 2235   BILITOT 0.3 08/21/2021 1001   BILITOT 0.50 03/15/2017 0920   GFRNONAA 58 (L) 10/30/2021 1811   GFRAA >60 07/22/2019 2223     ASSESSMENT & PLAN:81 y.o. male   Symptom management: Chest and back pain, worsening GERD, early satiety, weakness and fatigue -Onset 2 months ago, chest and back pain are similar to initial presentation with esophagus cancer from 2017 -Tolerating normal diet without significant dysphagia or odynophagia, no weight loss which is reassuring -He restarted Protonix for GERD, pain is slightly improved but not resolved -CT 09/19/2021 shows no evidence of recurrent or metastatic cancer -Pain is likely multifactorial, I will CC my note to Dr. Loreta Ave for GI input given likely some component of GERD   2. 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  Staging work-up was negative for distant metastasis  -He was treated with concurrent chemo RT with weekly  carbo and Taxol.  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 Tyrone Sage mutually agreed not to pursue esophagectomy  due to his advanced age and general health condition -Posttreatment EGD and subsequent scans showed no evidence of disease -Last seen by [place] 12/2019, he completed 5 years of surveillance in 2022   3.  Intermittent anemia  -Mild, stable.  No anemia 04/2021 -He has had recurrent anemia today, Hgb 12.8, MCV 74; no other cytopenias -Denies bleeding   4.  Memory -In chart review ED notes mention daughter's concern for dementia, he has neurology consult 10/07/2021    PLAN:  No orders of the defined types were placed in this encounter.     All questions were answered. The patient knows to call the clinic with any problems, questions or concerns. No barriers to learning were detected. I spent *** counseling the patient face to face. The total time spent in the appointment was *** and more than 50% was on counseling, review of test results, and coordination of care.   Santiago Glad, NP-C @DATE @
